# Patient Record
Sex: Female | Born: 1945
Health system: Southern US, Community
[De-identification: ages and names within clinical notes are randomized; demographics above are authoritative.]

## PROBLEM LIST (undated history)

## (undated) DIAGNOSIS — L409 Psoriasis, unspecified: Secondary | ICD-10-CM

## (undated) DIAGNOSIS — T4145XA Adverse effect of unspecified anesthetic, initial encounter: Secondary | ICD-10-CM

## (undated) DIAGNOSIS — T884XXA Failed or difficult intubation, initial encounter: Secondary | ICD-10-CM

## (undated) DIAGNOSIS — N183 Chronic kidney disease, stage 3 (moderate): Secondary | ICD-10-CM

## (undated) DIAGNOSIS — E785 Hyperlipidemia, unspecified: Secondary | ICD-10-CM

## (undated) DIAGNOSIS — I10 Essential (primary) hypertension: Secondary | ICD-10-CM

## (undated) DIAGNOSIS — E119 Type 2 diabetes mellitus without complications: Secondary | ICD-10-CM

## (undated) DIAGNOSIS — M109 Gout, unspecified: Secondary | ICD-10-CM

## (undated) DIAGNOSIS — K635 Polyp of colon: Secondary | ICD-10-CM

## (undated) DIAGNOSIS — T7840XA Allergy, unspecified, initial encounter: Secondary | ICD-10-CM

## (undated) DIAGNOSIS — D649 Anemia, unspecified: Secondary | ICD-10-CM

## (undated) DIAGNOSIS — M1990 Unspecified osteoarthritis, unspecified site: Secondary | ICD-10-CM

## (undated) DIAGNOSIS — K5790 Diverticulosis of intestine, part unspecified, without perforation or abscess without bleeding: Secondary | ICD-10-CM

## (undated) DIAGNOSIS — T8859XA Other complications of anesthesia, initial encounter: Secondary | ICD-10-CM

## (undated) DIAGNOSIS — H269 Unspecified cataract: Secondary | ICD-10-CM

## (undated) HISTORY — DX: Unspecified cataract: H26.9

## (undated) HISTORY — DX: Allergy, unspecified, initial encounter: T78.40XA

## (undated) HISTORY — DX: Anemia, unspecified: D64.9

## (undated) HISTORY — PX: SALPINGOOPHORECTOMY: SHX82

## (undated) HISTORY — DX: Polyp of colon: K63.5

## (undated) HISTORY — PX: HIP SURGERY: SHX245

## (undated) HISTORY — PX: HERNIA REPAIR: SHX51

## (undated) HISTORY — PX: RHINOPLASTY: SHX2354

## (undated) HISTORY — DX: Unspecified osteoarthritis, unspecified site: M19.90

## (undated) HISTORY — PX: CHOLECYSTECTOMY: SHX55

## (undated) HISTORY — DX: Diverticulosis of intestine, part unspecified, without perforation or abscess without bleeding: K57.90

---

## 1999-11-24 ENCOUNTER — Inpatient Hospital Stay (HOSPITAL_COMMUNITY): Admission: EM | Admit: 1999-11-24 | Discharge: 1999-11-29 | Payer: Self-pay | Admitting: *Deleted

## 1999-11-26 ENCOUNTER — Encounter: Payer: Self-pay | Admitting: Family Medicine

## 1999-12-24 ENCOUNTER — Encounter: Admission: RE | Admit: 1999-12-24 | Discharge: 1999-12-24 | Payer: Self-pay | Admitting: Family Medicine

## 2000-12-16 ENCOUNTER — Ambulatory Visit: Admission: RE | Admit: 2000-12-16 | Discharge: 2000-12-16 | Payer: Self-pay | Admitting: Gynecology

## 2002-03-24 HISTORY — PX: TOTAL HIP ARTHROPLASTY: SHX124

## 2002-07-14 ENCOUNTER — Encounter: Payer: Self-pay | Admitting: Orthopedic Surgery

## 2002-07-25 ENCOUNTER — Encounter: Payer: Self-pay | Admitting: Orthopedic Surgery

## 2002-07-25 ENCOUNTER — Inpatient Hospital Stay (HOSPITAL_COMMUNITY): Admission: RE | Admit: 2002-07-25 | Discharge: 2002-07-29 | Payer: Self-pay | Admitting: Orthopedic Surgery

## 2002-10-25 ENCOUNTER — Encounter: Payer: Self-pay | Admitting: General Surgery

## 2002-10-28 ENCOUNTER — Inpatient Hospital Stay (HOSPITAL_COMMUNITY): Admission: RE | Admit: 2002-10-28 | Discharge: 2002-10-30 | Payer: Self-pay | Admitting: General Surgery

## 2002-10-28 ENCOUNTER — Encounter (INDEPENDENT_AMBULATORY_CARE_PROVIDER_SITE_OTHER): Payer: Self-pay | Admitting: Specialist

## 2002-10-28 ENCOUNTER — Encounter: Payer: Self-pay | Admitting: General Surgery

## 2006-10-20 ENCOUNTER — Encounter (INDEPENDENT_AMBULATORY_CARE_PROVIDER_SITE_OTHER): Payer: Self-pay | Admitting: Family Medicine

## 2006-10-20 ENCOUNTER — Ambulatory Visit: Payer: Self-pay | Admitting: Vascular Surgery

## 2006-10-20 ENCOUNTER — Ambulatory Visit (HOSPITAL_COMMUNITY): Admission: RE | Admit: 2006-10-20 | Discharge: 2006-10-20 | Payer: Self-pay | Admitting: Family Medicine

## 2007-03-25 HISTORY — PX: COLONOSCOPY: SHX174

## 2007-05-26 ENCOUNTER — Ambulatory Visit: Payer: Self-pay | Admitting: Vascular Surgery

## 2007-12-17 ENCOUNTER — Ambulatory Visit: Payer: Self-pay | Admitting: Gastroenterology

## 2007-12-31 ENCOUNTER — Ambulatory Visit: Payer: Self-pay | Admitting: Gastroenterology

## 2007-12-31 ENCOUNTER — Encounter: Payer: Self-pay | Admitting: Gastroenterology

## 2008-01-04 ENCOUNTER — Encounter: Payer: Self-pay | Admitting: Gastroenterology

## 2009-11-30 DIAGNOSIS — M5137 Other intervertebral disc degeneration, lumbosacral region: Secondary | ICD-10-CM | POA: Insufficient documentation

## 2009-12-09 DIAGNOSIS — E559 Vitamin D deficiency, unspecified: Secondary | ICD-10-CM | POA: Insufficient documentation

## 2010-06-14 DIAGNOSIS — M19029 Primary osteoarthritis, unspecified elbow: Secondary | ICD-10-CM | POA: Insufficient documentation

## 2010-08-09 NOTE — Op Note (Signed)
NAME:  Jacqueline Richmond, Jacqueline Richmond                       ACCOUNT NO.:  [id number]   MEDICAL RECORD NO.:  [id number]                   PATIENT TYPE:  INP   LOCATION:  0275                                 FACILITY:  [facility]   PHYSICIAN:  Shellia Carwin, M.D.              DATE OF BIRTH:  [birth year]   DATE OF PROCEDURE:  10/28/2002  DATE OF DISCHARGE:                                 OPERATIVE REPORT   OPERATION/PROCEDURE:  1. Repair incisional hernia with Kugel dual-layer mesh.  2. Laparoscopic cholecystectomy with intraoperative cholangiogram.   SURGEON:  Shellia Carwin, M.D.   ASSISTANT:  ________.   ANESTHESIA:  General endotracheal anesthesia.   PREOPERATIVE DIAGNOSES:  1. Incisional hernia.  2. Gallstones.   POSTOPERATIVE DIAGNOSES:  1. Incisional hernia.  2. Gallstones.  3. Normal-appearing intraoperative cholangiogram.   CLINICAL SUMMARY:  A 65 year old lady presented with a enlarging, painful  bulge in her left lower abdomen.  She was approximately two years following  TAH and BSO by Dr. Marti Sleigh.  The bulge actually has been  present about six months and is increasing in size.  Because it is  symptomatic, becoming larger, she comes in for surgery.  Preoperative alk  phos was elevated and ultrasound obtained showing gallstones.  Careful  questioning shows that the patient was having abdominal discomfort  consistent with that.  It was decided to combine the two procedures,  especially since she has a history of difficult intubation and this would  preclude a second operation.   OPERATIVE FINDINGS:  Gallbladder had multiple adhesions and was partially  intrahepatic. It was not acutely inflamed and the cholangiogram looked  normal.  The incisional hernia had incarcerated omentum but was not  inflamed.  The fascial defect was about 8 cm in size and the piece of mesh  used in the repair was approximately 11 x 14 cm.   DESCRIPTION OF PROCEDURE:  Under satisfactory  general endotracheal  anesthesia, having received 1 g Ancef, the patient's abdomen was prepped and  draped in the standard fashion.   A transverse incision made above the umbilicus after Marcaine infiltrated,  in the midline which was opened into the peritoneum and controlled with a  figure-of-eight 0 Vicryl suture.  Camera was placed and we immediately  encountered adhesions from her previous surgery.  I was able to manipulate  the camera so that I could see the upper abdomen and placed a second #10  port medially.  Then using the cautery scissors through that incision, I  took down multiple adhesions so that we could then place our ports in the  right side of the abdomen.  After the was accomplished, using the ports on  the right side, the remaining adhesions were taken down and we had excellent  visualization of the gallbladder.  Gallbladder was grasped and with good  retraction and exposure, I carefully dissected the cystic duct.  When we  were certain of the anatomy by circumferentially dissection, it was clipped  near the gallbladder and incised.  A percutaneously placed catheter was used  to get good cholangiogram, and then the catheter withdrawn and the duct  controlled with multiple clips and divided.  Likewise the main cystic artery  and a second branch were divided between clips and then the gallbladder  removed from below up wards using the cautery for hemostasis and dissection.  It was somewhat tedious because the gallbladder was intrahepatic.  A small  hole was made in the gallbladder and the bile suctioned away, but there were  no spillage of stone.  After the gallbladder was freed from the liver bed,  the bed was lavaged with saline, made dry by cautery and the excess saline  suctioned away.  The gallbladder was placed in an EndoCatch bag.  The camera  moved to the upper port and the grasper placed in the umbilical port, used  to remove the gallbladder intact and without  problems.  The CO2 was  released, the midline was closed with the previous figure-of-eight and a  second interrupted 0 Vicryl suture.  All four sites were approximated with  staples.   The incisional hernia was then repaired. The midline incision was reopened  and the hernia sac dissected from the subcutaneous and soft tissues.  It was  followed down to the fascia.  The hernia sac was then opened and the omentum  that was adherent and partially incarcerated was reduced.  This again was  somewhat tedious and then the adhesions of omentum to the undersurface of  the abdominal wall were freed all around for at least 4 cm to allow good  placement of the mesh.  After that was performed, the 11 x 14 cm composite  Kugel mesh was prepared.  Sutures were placed through and through the fascia  into the abdomen, through and through the mesh, and then back up through the  fascia.  This was initially performed at the four quadrants.  Following  this, interrupted sutures were placed between and then when brought up and  tied on one side, there was a total of five sutures holding the mesh in good  position against the abdominal wall.  Then the other side was treated in a  similar fashion with three additional sutures so that we had eight sutures  totally through the mesh holding it nicely against the abdominal wall.  Following this, the wound was lavaged with saline.  The weakened fascia and  hernia sac was closed over the mesh with interrupted figure-of-eight 0  Prolene suture, the subcutaneous lavaged with saline and drained with a  large JP, the subcutaneous approximated with 0 Vicryl sutures, and skin  edges with staples.  Sterile absorbent dressings were applied.  The drain  was secured with a suture.  There were no complications and the sponge and  needle counts were correct.                                                Shellia Carwin, M.D.   MRL/MEDQ  D:  10/28/2002  T:  10/28/2002   Job:  [id number]   cc:   Marti Sleigh, M.D.

## 2010-08-09 NOTE — Discharge Summary (Signed)
NAME:  Jacqueline Richmond, Jacqueline Richmond                       ACCOUNT NO.:  [id number]   MEDICAL RECORD NO.:  [id number]                   PATIENT TYPE:  INP   LOCATION:  [id number]                                 FACILITY:  [facility]   PHYSICIAN:  Gaynelle Arabian, M.D.                 DATE OF BIRTH:  07-14-1945   DATE OF ADMISSION:  07/25/2002  DATE OF DISCHARGE:  07/29/2002                                 DISCHARGE SUMMARY   ADMITTING DIAGNOSES:  1. Osteoarthritis right hip.  2. Hypertension.  3. Hypercholesterolemia.  4. Abdominal incisional hernia.   DISCHARGE DIAGNOSES:  1. 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  2. Postoperative hyponatremia.  3. Hypertension.  4. Hypercholesterolemia.  5. Abdominal incisional hernia.   PROCEDURE:  The patient was taken to the OR on Jul 25, 2002 and underwent a  right total hip arthroplasty.  Surgeon - Dr. Gaynelle Arabian; assistant - Arlee Muslim, P.A.-C.  Surgery done under general anesthesia.  Estimated blood  loss 400 mL.  Hemovac drain x1.   CONSULTS:  None.   BRIEF HISTORY:  The patient is a 65 year old female seen by Dr. Wynelle Link for  progressive right buttock and right hip pain that has been ongoing for  approximately three years now and getting progressively worse.  She was seen  in the office.  Her x-rays show severe end-stage erosive arthritis of the  right hip, almost an ankylosed appearance.  She is having difficulty walking  and it started having a significant effect on her lifestyle.  It was felt  she would benefit from undergoing hip replacement.  Risks and benefits  discussed and subsequently admitted to the hospital.   LABORATORY DATA:  CBC on admission had a hemoglobin of 14.6, hematocrit  43.0, white cell count 9.1, red cell count 5.14; postoperative H&H 12.2 and  35.5; last noted H&H 11.1 and 32.3 - all within normal limits.  PT/PTT on  admission 13.2 and 34 respectively with an INR of 1.0; serial  protimes were  followed with last noted PT/INR 21.1 and 2.1.  Chem panel on admission:  Minimally elevated ALP of 128; remaining chem panel all within normal  limits.  Serial BMETs were followed, had a drop in calcium from 9.6 to 7.9;  drop in sodium from 143 to 136, last noted at 130.  Urinalysis showed large  leukocyte esterase with few epithelial cells, 3-6 white cells, and only a  few bacteria.  Blood group and type O positive.  Pelvis and hip films taken  on Jul 25, 2002 showed satisfactory position of total right hip replacement.   EKG dated July 14, 2002:  Normal sinus rhythm, nonspecific ST and T wave  abnormalities - confirmed by Dr. Terald Sleeper.  Chest x-ray dated July 14, 2002:  Mild cardiomegaly, no active disease.  Preoperative hip films showed  severe degenerative changes of right  hip joint.   HOSPITAL COURSE:  The patient was admitted to [facility], taken to  the OR, and underwent the above-stated procedure without complication.  The  patient tolerated the procedure well and was later transferred to the  recovery room and then to the orthopedic floor for continued postoperative  care.  Vital signs were followed.  The patient was given 24 hours of  postoperative IV antibiotics and placed on Coumadin for DVT prophylaxis.  PT  and OT were consulted to assist with gait training ambulation and ADLs.  Hemovac drain placed at the time of surgery was pulled on postoperative day  #1 without difficulty.  Dressing changes were initiated on postoperative day  #2.  Hemoglobin remained stable.  The patient did well with physical  therapy, ambulating approximately 50 feet, and 80 feet by postoperative day  #2, and then 100 feet by postoperative day #3, and later 150 feet.  The  patient did very well, progressed well with physical therapy.  Initially  placed on IV analgesics, weaned over to p.o. medications.  IV's were  discontinued.  By postoperative day #3 was passing flatus;   however, no  bowel movement and the patient worked with bowel program, Dulcolax  suppositories.  By postoperative day #4, ambulating quite well, moving  bowels, and was ready to go home.   DISCHARGE MEDICATIONS AND PLAN:  1. The patient discharged home on Jul 29, 2002.  2. Discharge diagnoses:  Please see above.  3. Discharge medications:  Percocet for pain, Robaxin for spasm, Coumadin     for DVT prophylaxis.  4. Diet:  Low sodium diet, low cholesterol diet.  5. Activity:  Touchdown weightbearing right lower extremity.  6. Home health PT and home health nursing per [facility].  7. Total hip protocol.  8. Follow up in two weeks.   CONDITION ON DISCHARGE:  Improved.     Alexzandrew L. Dara Lords, P.A.              Gaynelle Arabian, M.D.    ALP/MEDQ  D:  08/17/2002  T:  08/17/2002  Job:  [id number]

## 2010-08-09 NOTE — Consult Note (Signed)
[facility]  Patient:    Jacqueline Richmond, Jacqueline Richmond Visit Number: [id number] MRN: [id number]          Service Type: GON Location: GYN Attending Physician:  Alvino Chapel Dictated by:   Cherly Anderson. Clarke-Pearson, M.D. Proc. Date: 12/16/00 Admit Date:  12/16/2000 Discharge Date: 12/16/2000   CC:         Caswell Corwin, R.N.                          Consultation Report  FOLLOWUP EVALUATION:  62 white female returns for postoperative checkup.  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  She underwent total abdominal hysterectomy and bilateral salpingo-oophorectomy and had an uncomplicated postoperative course.  Since last visit, she has done well.  She denies any GI or GU symptoms, has no pelvic pain, pressure, vaginal bleeding or discharge.  PHYSICAL EXAMINATION:  VITAL SIGNS:  Weight 216 pounds.  Blood pressure 165/98, pulse 84, respiratory rate 16.  Height 5 feet 5 inches.  GENERAL:  Patient is a healthy white female in no acute distress.  HEENT:  Negative.  NECK:  Supple without thyromegaly.  ABDOMEN:  Soft and nontender.  No masses, organomegaly, ascites or herniae are noted.  PELVIC:  EGBUS normal.  Vagina is clean.  The cuff is healing nicely. Bimanual and rectovaginal exam reveal some postoperative induration without any masses or nodularity.  IMPRESSION:  Good postoperative recovery.  At this juncture, the patient is given the okay to return to full levels of activity.  I have advised her that she should have an annual gynecologic exam which can be performed by her primary care physician and I would be happy to see her in the future if there were other concerns or questions. Dictated by:   Cherly Anderson. Clarke-Pearson, M.D. Attending Physician:  Alvino Chapel DD:  12/22/00 TD:  12/22/00 Job:  [id number]

## 2010-08-09 NOTE — Discharge Summary (Signed)
[place]  Patient:    Jacqueline Richmond, Jacqueline Richmond                        MRN: [id number] Adm. Date:  [id number] Disc. Date: [id number] Attending:  Tiburcio Pea Dictator:   Kelle Darting, M.D. CC:         Dr. Coletta Memos   Discharge Summary  ADMISSION DIAGNOSES: 1. Diarrhea. 2. Dehydration.  DISCHARGE DIAGNOSES: 1. Diarrhea. 2. Dehydration. 3. Acute renal failure.  CONSULTATIONS:  Phone consult was obtained from nephrologist, Dr. Mercy Moore, on November 28, 1999.  PROCEDURES:  None.  PERTINENT LABORATORY DATA:  CBC at admission showed a white blood cell count of 13.8, hemoglobin 17.9, hematocrit 52.7, platelets 415.  Basic metabolic panel at admission showed a sodium of 138, potassium 4.2, chloride 99, CO2 of 16, glucose 163, BUN 65, creatinine 9.3, calcium 8.1, Total protein 8.3, albumin 3.6.  BMET on day of discharge showed a sodium of 139, potassium 3.6, chloride 105, CO2 of 26, BUN 19, creatinine 1.2, glucose 83, calcium 8.9, phosphorus 4.3, albumin 2.2.  Renal ultrasound showed no hydronephrosis, kidneys 13.1 cm bilaterally.  No abnormalities noted.  A 24-hour urine volume 4650 cc, 24-hour protein 1628 mg, 24-hour urine creatinine 2558, 24-hour urine potassium 34, 24-hour urine sodium 270.  HISTORY OF PRESENT ILLNESS AND HOSPITAL COURSE:  The patient is a 65 year old white female who presented with a four-day history of explosive watery diarrhea and vomiting.  She denies fevers and says that she has been unable to take her medications.  She denies any blood in the stool or the vomitus.  She denies any abdominal cramping or any headaches.  She denies taking any recent antibiotics.  She also acknowledges some dizziness upon going from a sitting to standing position.  PAST MEDICAL HISTORY:  Hypertension, hyperlipidemia, and osteoarthritis of the right hip as well as uterine fibroids and enlarged uterus which was being evaluated  by her primary MD.  PAST SURGICAL HISTORY:  Rhinoplasty and tubal ligation 20 years prior to presentation.  MEDICATIONS:  She had been taking only Lipitor, Univasc, atenolol, and occasional Celebrex.  She also notes taking indomethacin occasionally for pain and notes only a maximum of three per day but no significant amount the week prior to presentation.  REVIEW OF SYSTEMS:  She denies any change in her food intake.  SOCIAL HISTORY:  She denies alcohol or drug intake.  PHYSICAL EXAMINATION:  At presentation, her blood pressure was 58/25 and responded to a normal saline bolus to 103/60.  Physical exam was notable for signs of dehydration but was otherwise negative.  ASSESSMENT AND PLAN:  BUN on presentation was 65, creatinine was 9.3, the bicarb was 16.  Albumin was 3.6.  Urinalysis revealed a specific gravity of 1.025, with 100 of glucose, greater than 300 Total protein, nitrite positive, leukocyte esterase trace, and 15 ketones.  The patient was admitted for severe dehydration and was immediately put on normal saline IV.  Her hypertension medications were held and she was admitted to step-down for close observation.  HOSPITAL COURSE BY SYSTEM:  #1 - GASTROINTESTINAL:  The patients diarrhea persisted for the first several days of hospitalization but slowed down considerably on the third hospital day and was concomitant with administration of Pepto-Bismol.  She denied any vomiting after presentation and did not have any during the whole hospital course.  After initial one day of hydration, patient was started on clear-liquid diet and advanced  slowly and by the third hospital day patient was tolerating a regular diet without any problem.  By discharge, her diarrhea had completely resolved.  She remained afebrile throughout the entire hospital stay.  Evaluation of the stool revealed guaiac positive but negative for white blood cells, culture, ova and parasites.  #2 - RENAL/FLUIDS,  ELECTROLYTES, AND NUTRITION:  As stated earlier, the patient was first kept n.p.o. then was advanced on a clear-liquid diet without any problems to a full regular diet.  She was initially hydrated with normal saline and it took several days to respond by an increase in blood pressure and a decrease in her heart rate.  She denied any symptoms of lightheadedness during this time of hypotension.  She actually stated that she did not feel bad at all.  The patient was eventually switched to a maintenance IV fluid after blood pressures began to respond and she began to make urine.  As stated in the HPI, the patients BUN and creatinine at presentation were extremely high at 65 and 9.3.  As the patient was aggressively hydrated, these values slowly diminished and creatinine fell from 9.3 at presentation to 8.1 to 6.7 to 3.9 to 1.7 and finally to 1.2 on the final hospital day.  Serum bicarbonate which was 16 at presentation responded to the aggressive hydration and was 26 at the time of discharge.  It was noted that the patient did not make any urine for the first 24 hours of hospitalization.  She did not have a catheter in during this period of time.  After the first 24 hours of hospitalization, a catheter was inserted and 750 cc of urine was obtained immediately.  After this, the patient consistently drained urine through the catheter.  After several days of this, the catheter was removed and the patient displayed no problems in voiding on her own.  The fact that the patient had such a high BUN and creatinine at presentation, along with the fact that it took several liters of normal saline to begin a diuresis, the question was brought up as to whether she might have had some prior kidney problem.  She denies ever having any kidney problems.  I could talk to Dr. Mercy Moore, the nephrologist, told of this patients story and he gave his opinion.  He stated that it is not entirely uncommon to see such  high BUN and creatinine from severe dehydration  and that given the patients recovery at this point he doubts that there was any renal parenchymal involvement and that it was most likely all the results of prerenal azotemia.  He also commented that the 24-hour urine that was collected was done at a time late in the evolution of this illness which makes it difficult to interpret the data, specifically the proteinuria.  He suggested a follow-up urinalysis in six weeks with her primary care doctor and if proteinuria was seen at that time he suggested getting a 24-hour urine collection and analyzing for proteinuria again and then proceeding possibly with a renal consult at that time.  At the time of discharge, the patient had no problems voiding.  As stated earlier, BUN and creatinine had normalized.  #3 - CARDIOVASCULAR:  At presentation, it was noted that the patients blood pressure was consistently low: The range was from 70 to 80 systolic over 30 to 50 diastolic for approximately the first 1-1/2 days of hospitalization. These finally began to respond to the aggressive rehydration and by the fourth hospital day the patient  who had previously been hypertensive and taking medicine for hypertension began to have normal blood pressures.  In fact, by the end of the hospital stay, she began to have some elevated pressures and her oral beta blocker was restarted at half the original dose.  The ACE inhibitor that she had been on at presentation was discontinued for this hospitalization and was not re-prescribed at discharge.  It should also be noted that the patients heart rate at presentation was consistently 90 to 105 and as did the blood pressure it took a while to respond to the aggressive rehydration but at discharge was consistently in the 80s.  #4 - PULMONARY:  The patient had no periods shortness of breath or chest tightness.  She did not have any previous pulmonary problems.  She did have  an oxygen requirement for several days during this hospitalization.  It is believed that this is most likely due to hypoventilation due to her bedridden status and weakness.  She was weaned off O2 on the second to last hospital day and went home on no oxygen.  #5 - MUSCULOSKELETAL:  The patient had presented with a history of osteoarthritis.  After several days of complete bed rest, the patient attempted to ambulate and had severe difficulty and was actually unable to do so for the first several attempts.  She noted that it was a combination of difficulty with pain in her hip and with swollen feet.  She was noted to have a significant amount of edema in both lower extremities up to the knees.  I believe this is most likely due to her hypoalbuminemia which was secondary to her proteinuria.  After the initial difficulties in walking, the patient was encouraged to ambulate and a PT consult was obtained.  She was assisted in moving around without a walker and with a walker.  It was determined that she would need a walker for balance and support in moving around her surroundings at least for the first week or two after returning home.  She was discharged with a walker.  DISCHARGE INSTRUCTIONS:  No restriction on activity.  Patient was instructed to have a low-sodium diet, less than 2 g a day.  She was instructed to take her atenolol 50 mg q.d., Lipitor 20 mg daily, and Os-Cal + D 1500 mg twice daily.  She was also instructed to call her primary care physician, Dr. Coletta Memos, at [facility] in [place] and arrange a follow-up appointment for the week of September 17 to September 23.  Dr. Coletta Memos had been informed of this hospitalization and her course and he requested follow up at the previously stated dates. DD:  11/29/99 TD:  12/01/99 Job: [id number]

## 2010-08-09 NOTE — Op Note (Signed)
NAME:  Jacqueline Richmond, Jacqueline Richmond                       ACCOUNT NO.:  [id number]   MEDICAL RECORD NO.:  [id number]                   PATIENT TYPE:  INP   LOCATION:  [id number]                                 FACILITY:  [facility]   PHYSICIAN:  Gaynelle Arabian, M.D.                 DATE OF BIRTH:  Aug 24, 1945   DATE OF PROCEDURE:  07/25/2002  DATE OF DISCHARGE:                                 OPERATIVE REPORT   PREOPERATIVE DIAGNOSIS:  Osteoarthritis, right hip.   POSTOPERATIVE DIAGNOSIS:  Osteoarthritis, right hip.   PROCEDURE:  Right total hip arthroplasty.   SURGEON:  Dione Plover. Aluisio, M.D.   ASSISTANT:  Alexzandrew L. Dara Lords, P.A.   ANESTHESIA:  General.   ESTIMATED BLOOD LOSS:  400.   DRAIN:  Hemovac x 1.   COMPLICATIONS:  None.   CONDITION:  Stable to recovery.   BRIEF CLINICAL NOTE:  Jacqueline Richmond is a 65 year old female with severe end-stage  osteoarthritis of the right hip with pain refractory to nonoperative  management.  She has massive osteophyte formation and essentially an effused  hip.  She presents now for total hip arthroplasty.   PROCEDURE IN DETAIL:  After the successful administration of general  anesthetic, the patient is placed in the left lateral decubitus position  with the right side up and held with the hip positioner.  The right lower  extremity is isolated from her perineum with plastic drapes and prepped and  draped in the usual sterile fashion.  Standard posterolateral incision is  made with a 10 blade through subcutaneous tissue to the level of fascia lata  which is incised in line with the skin incision.  Sciatic nerve is palpated  and protected and short rotators isolated off the femur.  Then did a  capsulectomy.  She had such massive osteophytes that the femoral head is  essentially fused, would not rotate.  I removed some of the posterior  osteophytes to allow for some internal rotation in an attempt to dislocate.  We were able to sublux her and put retractors  around the femoral neck.  I  then placed a trial prosthesis up to her native femoral head such that the  center of the trial head corresponds to the center of her native femoral  head, and we marked osteotomy line on the femoral neck and made an osteotomy  with an oscillating saw.  We then retracted the femur anteriorly.  Had to  remove more osteophytes from the acetabulum to pry out the femoral head.  Once the femoral head was out, we gained acetabular exposure and removed the  remainder of the osteophytes circumferentially.   Acetabular reaming is then initiated starting at 45, coursing in increments  of 2 to a 51.  We then placed a 52 mm Pinnacle acetabular shell in anatomic  position with excellent fit, and I transfixed it with the two additional  dome screws with excellent purchase.  We then placed a 36 plus 0 trial liner  in so that we could do a metal-on-metal construct.   Femur is then prepared, first with the canal finder, then irrigation.  Axial  remaining is performed to 13.5 mm, proximal reaming 18D, and a sleeve  machined to a small.  An 18D small trial sleeve is placed and 18 x 13 stem  36 plus 8 neck.  Hip is reduced with excellent stability, full extension,  full external rotation, 70 degrees flexion, 40 degrees adduction, 90 degrees  internal rotation; 90 degrees flexion, 70 degrees internal rotation.  She is  about three-quarters of an inch short preoperatively and with this current  construct on the table, her leg lengths were equalized.  Hip is then  dislocated, and the trials are removed.  Permanent apex hole eliminator is  placed, and the permanent 36 mm neutral Ultra-met metal liner is placed into  the acetabular shell.  In the femur, the 18D small sleeve is placed and 18 x  13 stem with 36 plus 8 neck placed, going about 10 degrees beyond her native  anteversion.  Then placed the 36 plus 0 head, reduced the hip, and there is  excellent stability throughout.  The  wound is copiously irrigated with  antibiotic solution and short rotators reattached to the femur through drill  holes.  Fascia lata is closed over a Hemovac drain with interrupted #1  Vicryl, subcu closed with #1 and then 2-0 Vicryl, and subcuticular running 4-  0 Monocryl.  Incision is then cleaned and dried and Steri-Strips and a bulky  sterile dressing applied.  She is then placed in supine position, knee  immobilizer placed, and she is awakened and transported to recovery in  stable condition.                                               Gaynelle Arabian, M.D.    FA/MEDQ  D:  07/25/2002  T:  07/25/2002  Job:  [id number]

## 2010-08-09 NOTE — H&P (Signed)
NAME:  Jacqueline Richmond, Jacqueline Richmond                       ACCOUNT NO.:  [id number]   MEDICAL RECORD NO.:  [id number]                   PATIENT TYPE:  INP   LOCATION:  [id number]                                 [facility]ACILITY:  [facility]   PHYSICIAN:  Gaynelle Arabian, M.D.                 DATE OF BIRTH:  December 27, 1945   DATE OF ADMISSION:  07/25/2002  DATE OF DISCHARGE:                                HISTORY & PHYSICAL   DATE OF OFFICE VISIT HISTORY AND PHYSICAL:  July 19, 2002.   CHIEF COMPLAINT:  Right hip pain.   HISTORY OF PRESENT ILLNESS:  The patient is a 65 year old female who has  been seen by Dr. Wynelle Link for progressive right buttock and right hip pain.  It has been ongoing for approximately three years now and is progressively  getting worse.  She is having difficulty walking long distances and has  started having significant effect on her lifestyle.  She has difficulty  getting up and out of a sitting position.  The pain is mainly laterally in  the buttock.  She was seen in the office where x-rays do show severe end-  stage erosive arthritis of the right hip with almost an ankylosed  appearance.  It is felt she would best be served by undergoing a hip  replacement.  The risks and benefits were discussed, and the patient has  elected to proceed with surgery.   ALLERGIES:  TALWIN and CODEINE.   CURRENT MEDICATIONS:  1. Toprol-XL 100 mg daily.  2. Lipitor 10 mg daily.  3. Indocin for gout p.r.n.   PAST MEDICAL HISTORY:  1. Hypertension.  2. Incisional hernia.  3. Elevated cholesterol.   PAST SURGICAL HISTORY:  1. Rhinoplasty in 1970.  2. Tubal ligation in 1987.  3. Salpingo-oophorectomy with hysterectomy in 2002.   SOCIAL HISTORY:  Married.  A realtor, an Art therapist at [employer].  Nonsmoker.  Only social intake of alcohol.  Has one son and four stepdaughters.  A two-  story home, two steps entering.   FAMILY HISTORY:  Father deceased at age 47 with a heart attack and heart  disease.   REVIEW  OF SYSTEMS:  GENERAL:  No fevers, chills, or night sweats.  NEUROLOGIC:  No seizures, syncope, or paralysis.  RESPIRATORY:  No shortness  of breath, productive cough, or hemoptysis.  CARDIOVASCULAR:  No chest pain,  angina, or orthopnea.  GASTROINTESTINAL:  No nausea, vomiting, diarrhea, or  constipation.  No blood or mucous in the stool.  GENITOURINARY:  No dysuria,  hematuria, or discharge.  MUSCULOSKELETAL:  Pertinent to that of the right  hip found in the history of present illness.   PHYSICAL EXAMINATION:  VITAL SIGNS:  Pulse 80, respirations 14, blood  pressure 148/98.  GENERAL:  The patient is a 66 year old white female, well-nourished, well-  developed.  Appears to be in no acute distress.  She is alert, oriented, and  cooperative.  HEENT:  Normocephalic, atraumatic.  Pupils round and reactive.  EOMs are  intact.  Oropharynx clear.  NECK:  Supple.  CHEST:  Clear to auscultation anterior and posterior chest walls.  No  rhonchi, rales, or wheezing.  HEART:  Regular rate and rhythm.  No murmurs.  ABDOMEN:  Soft and nontender.  Slightly round.  She does have an incisional  hernia.  Bowel sounds present.  RECTAL, BREASTS, GENITALIA:  Not done.  Not pertinent to present illness.  EXTREMITIES:  Significant to that of the right lower extremity.  She has hip  flexion of only about 70 degrees.  No internal or external rotation.  Only  abduction of about 10 degrees.  She does ambulate with an antalgic gait.   IMPRESSION:  1. Osteoarthritis right hip.  2. Hypertension.  3. Hypercholesterolemia.  4. Abdominal incisional hernia.   PLAN:  The patient will be admitted to [facility] to undergo a  right total hip replacement arthroplasty.  Surgery will be performed by Dr.  Gaynelle Arabian.     Alexzandrew L. Dara Lords, P.A.              Gaynelle Arabian, M.D.    ALP/MEDQ  D:  07/25/2002  T:  07/26/2002  Job:  [id number]

## 2010-12-23 LAB — GLUCOSE, CAPILLARY
Glucose-Capillary: 125 — ABNORMAL HIGH
Glucose-Capillary: 130 — ABNORMAL HIGH

## 2011-08-29 ENCOUNTER — Inpatient Hospital Stay (HOSPITAL_COMMUNITY)
Admission: EM | Admit: 2011-08-29 | Discharge: 2011-09-02 | DRG: 568 | Disposition: A | Discharge status: Home / Self Care | Payer: BC Managed Care – PPO | Attending: Internal Medicine | Admitting: Internal Medicine

## 2011-08-29 ENCOUNTER — Inpatient Hospital Stay (HOSPITAL_COMMUNITY): Payer: BC Managed Care – PPO

## 2011-08-29 ENCOUNTER — Encounter (HOSPITAL_COMMUNITY): Payer: Self-pay | Admitting: *Deleted

## 2011-08-29 DIAGNOSIS — N281 Cyst of kidney, acquired: Secondary | ICD-10-CM | POA: Diagnosis present

## 2011-08-29 DIAGNOSIS — I10 Essential (primary) hypertension: Secondary | ICD-10-CM

## 2011-08-29 DIAGNOSIS — R112 Nausea with vomiting, unspecified: Secondary | ICD-10-CM | POA: Diagnosis present

## 2011-08-29 DIAGNOSIS — E119 Type 2 diabetes mellitus without complications: Secondary | ICD-10-CM

## 2011-08-29 DIAGNOSIS — E875 Hyperkalemia: Secondary | ICD-10-CM | POA: Diagnosis present

## 2011-08-29 DIAGNOSIS — E872 Acidosis, unspecified: Secondary | ICD-10-CM | POA: Diagnosis present

## 2011-08-29 DIAGNOSIS — J309 Allergic rhinitis, unspecified: Secondary | ICD-10-CM | POA: Diagnosis present

## 2011-08-29 DIAGNOSIS — M109 Gout, unspecified: Secondary | ICD-10-CM | POA: Diagnosis present

## 2011-08-29 DIAGNOSIS — M6282 Rhabdomyolysis: Secondary | ICD-10-CM | POA: Diagnosis present

## 2011-08-29 DIAGNOSIS — N179 Acute kidney failure, unspecified: Secondary | ICD-10-CM

## 2011-08-29 DIAGNOSIS — N1831 Chronic kidney disease, stage 3a: Secondary | ICD-10-CM | POA: Diagnosis present

## 2011-08-29 DIAGNOSIS — I129 Hypertensive chronic kidney disease with stage 1 through stage 4 chronic kidney disease, or unspecified chronic kidney disease: Secondary | ICD-10-CM | POA: Diagnosis present

## 2011-08-29 DIAGNOSIS — K529 Noninfective gastroenteritis and colitis, unspecified: Secondary | ICD-10-CM

## 2011-08-29 DIAGNOSIS — E860 Dehydration: Secondary | ICD-10-CM

## 2011-08-29 DIAGNOSIS — N183 Chronic kidney disease, stage 3 unspecified: Secondary | ICD-10-CM

## 2011-08-29 HISTORY — DX: Chronic kidney disease, stage 3 unspecified: N18.30

## 2011-08-29 HISTORY — DX: Essential (primary) hypertension: I10

## 2011-08-29 HISTORY — DX: Type 2 diabetes mellitus without complications: E11.9

## 2011-08-29 HISTORY — DX: Adverse effect of unspecified anesthetic, initial encounter: T41.45XA

## 2011-08-29 HISTORY — DX: Chronic kidney disease, stage 3 (moderate): N18.3

## 2011-08-29 HISTORY — DX: Other complications of anesthesia, initial encounter: T88.59XA

## 2011-08-29 LAB — GLUCOSE, CAPILLARY
Glucose-Capillary: 156 mg/dL — ABNORMAL HIGH (ref 70–99)
Glucose-Capillary: 129 mg/dL — ABNORMAL HIGH (ref 70–99)
Glucose-Capillary: 111 mg/dL — ABNORMAL HIGH (ref 70–99)

## 2011-08-29 LAB — DIFFERENTIAL
Basophils Absolute: 0 10*3/uL (ref 0.0–0.1)
Lymphocytes Relative: 7 % — ABNORMAL LOW (ref 12–46)
Monocytes Relative: 2 % — ABNORMAL LOW (ref 3–12)
Neutrophils Relative %: 91 % — ABNORMAL HIGH (ref 43–77)

## 2011-08-29 LAB — CBC
Platelets: 252 10*3/uL (ref 150–400)
RDW: 15 % (ref 11.5–15.5)
WBC: 13.9 10*3/uL — ABNORMAL HIGH (ref 4.0–10.5)

## 2011-08-29 LAB — URINALYSIS, ROUTINE W REFLEX MICROSCOPIC
Glucose, UA: 100 mg/dL — AB
Specific Gravity, Urine: 1.016 (ref 1.005–1.030)
pH: 5.5 (ref 5.0–8.0)

## 2011-08-29 LAB — URINE MICROSCOPIC-ADD ON

## 2011-08-29 LAB — BASIC METABOLIC PANEL
Chloride: 102 mEq/L (ref 96–112)
Chloride: 108 mEq/L (ref 96–112)
Creatinine, Ser: 5.87 mg/dL — ABNORMAL HIGH (ref 0.50–1.10)
Creatinine, Ser: 5.34 mg/dL — ABNORMAL HIGH (ref 0.50–1.10)
GFR calc Af Amer: 8 mL/min — ABNORMAL LOW (ref >90)
GFR calc Af Amer: 9 mL/min — ABNORMAL LOW (ref >90)
GFR calc non Af Amer: 7 mL/min — ABNORMAL LOW (ref >90)
GFR calc non Af Amer: 8 mL/min — ABNORMAL LOW (ref >90)
Potassium: >7.5 mEq/L (ref 3.5–5.1)
Potassium: >7.5 mEq/L (ref 3.5–5.1)

## 2011-08-29 LAB — CK: Total CK: 1547 U/L — ABNORMAL HIGH (ref 7–177)

## 2011-08-29 LAB — HEPATIC FUNCTION PANEL
AST: 70 U/L — ABNORMAL HIGH (ref 0–37)
Bilirubin, Direct: <0.1 mg/dL (ref 0.0–0.3)

## 2011-08-29 IMAGING — US US RENAL
1 series · 14 of 23 positions shown · non-contrast
Comparison: No comparison studies available.

CLINICAL DATA: Acute renal failure

RENAL / URINARY TRACT ULTRASOUND
TECHNIQUE: Complete ultrasound exam of the kidneys and urinary
bladder was performed.

[Series 1: us renal · 0.32mm/px · 14 of 23 slices shown]
[im 1/23]
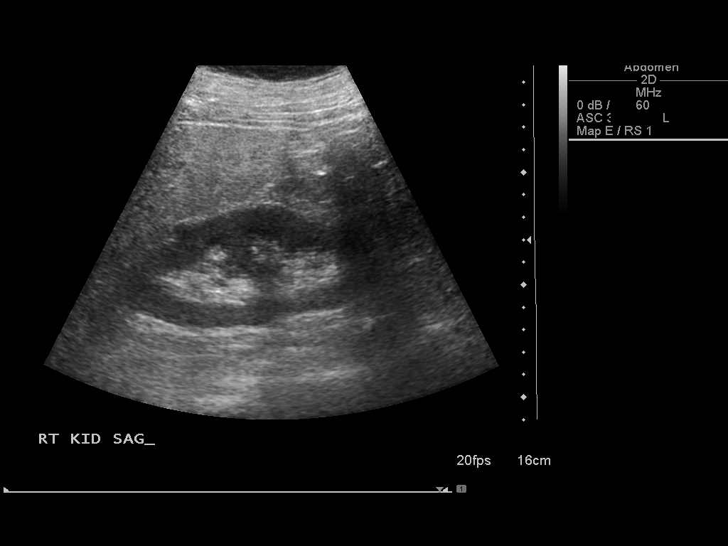
[im 3/23]
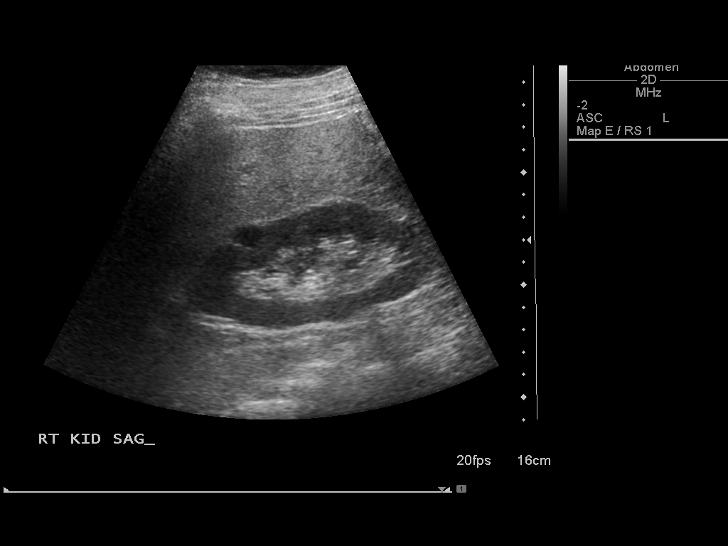
[im 5/23]
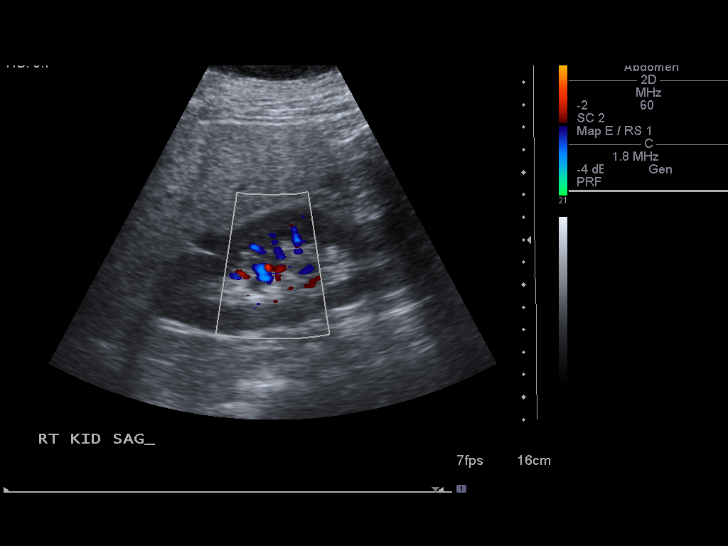
[im 6/23]
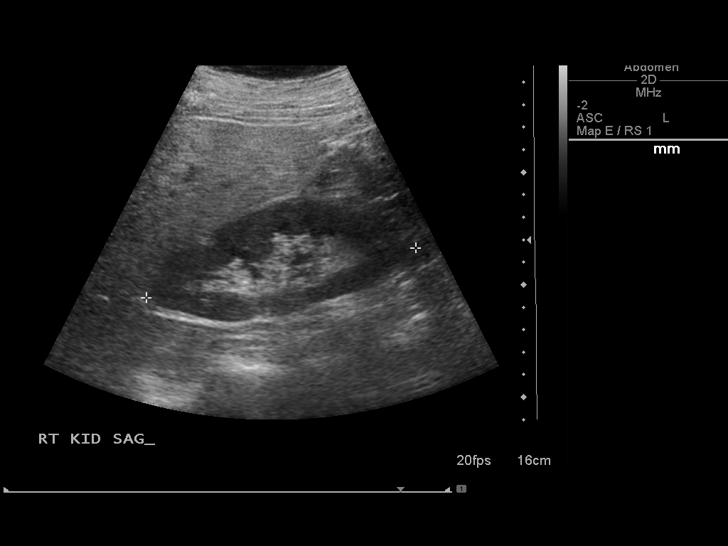
[im 8/23]
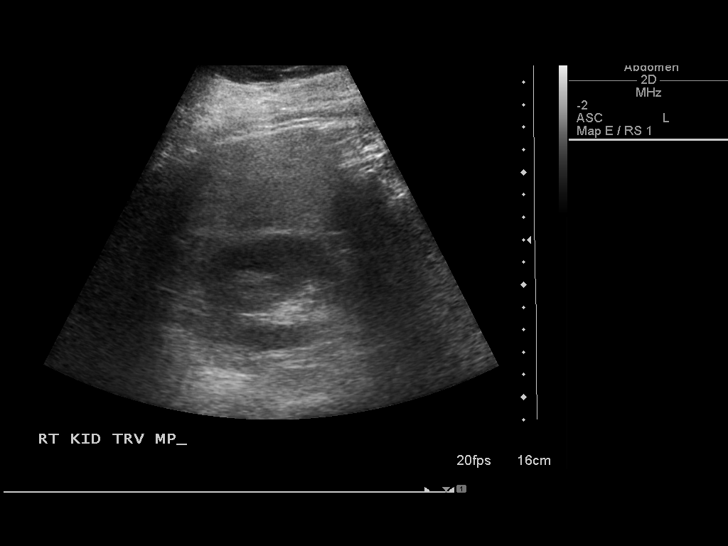
[im 10/23]
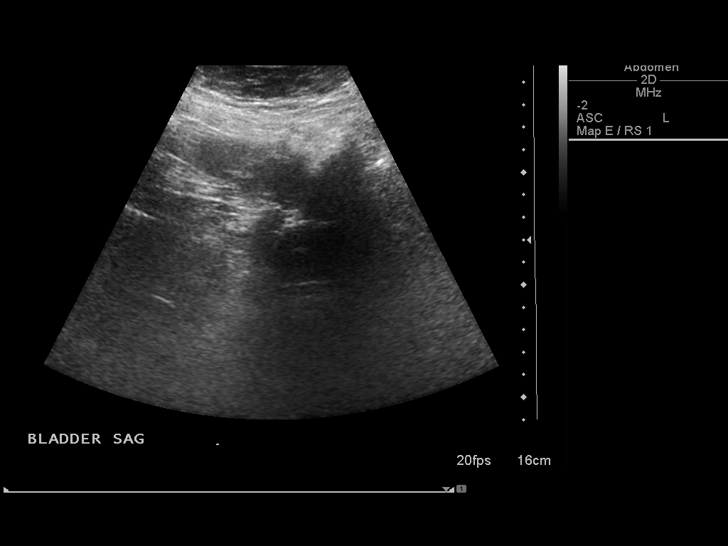
[im 11/23]
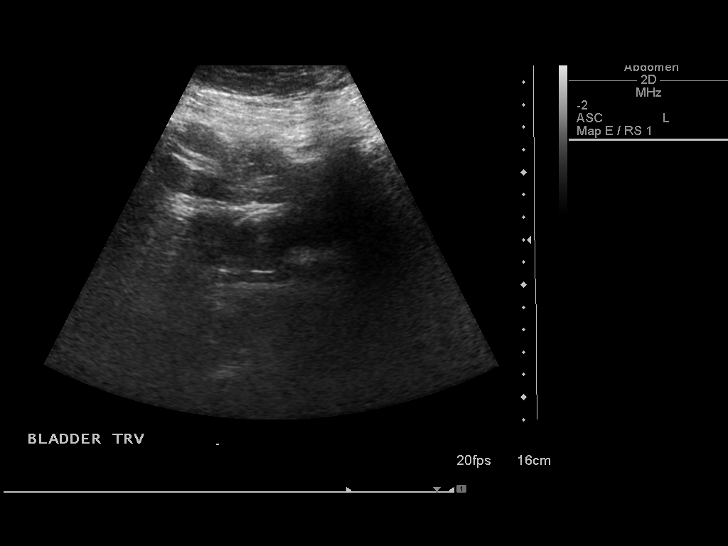
[im 13/23]
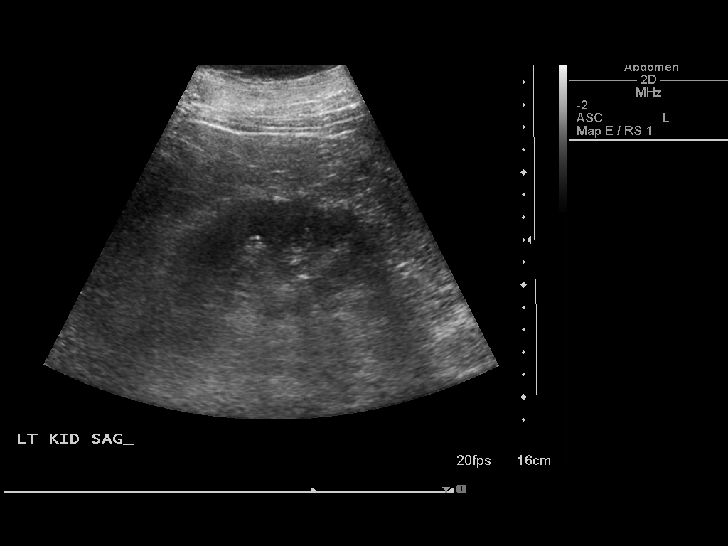
[im 14/23]
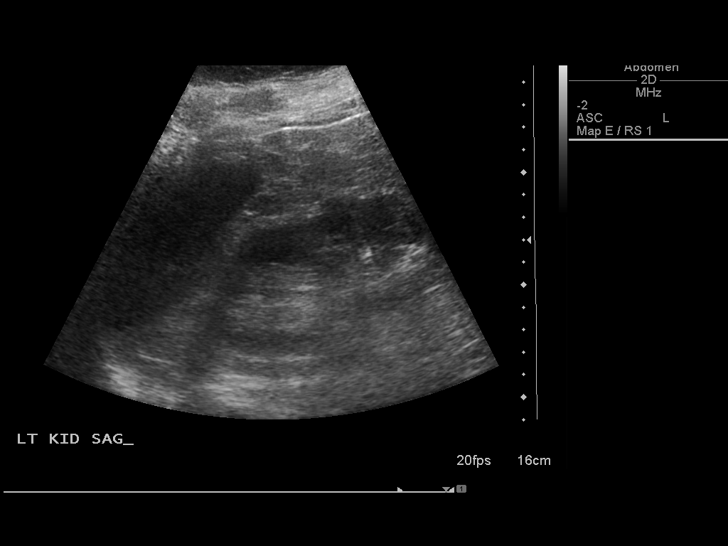
[im 16/23]
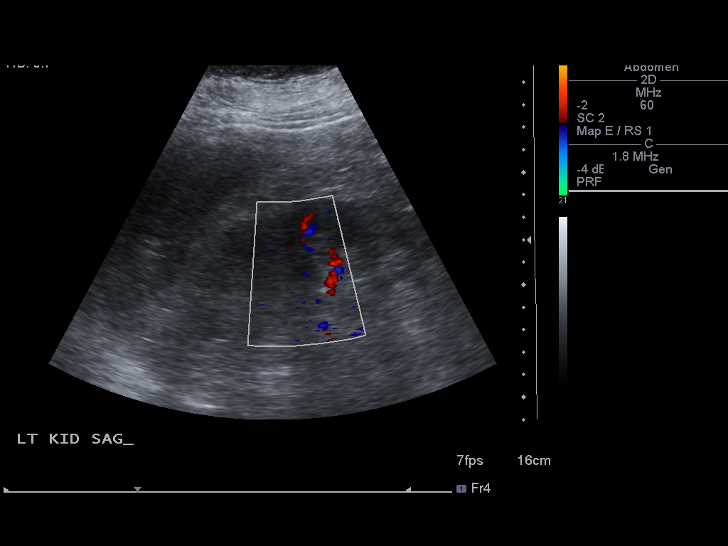
[im 18/23]
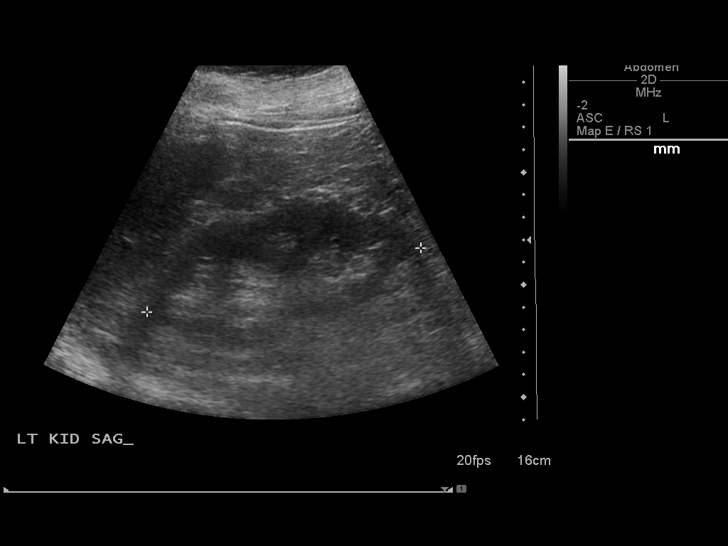
[im 19/23]
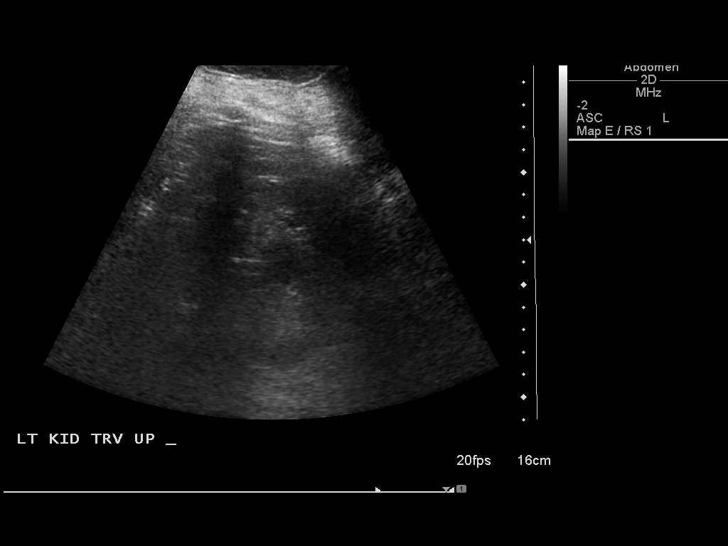
[im 21/23]
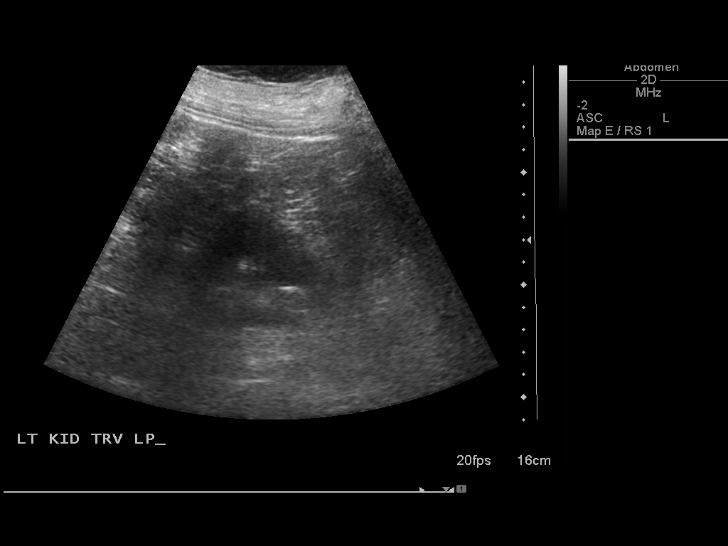
[im 23/23]
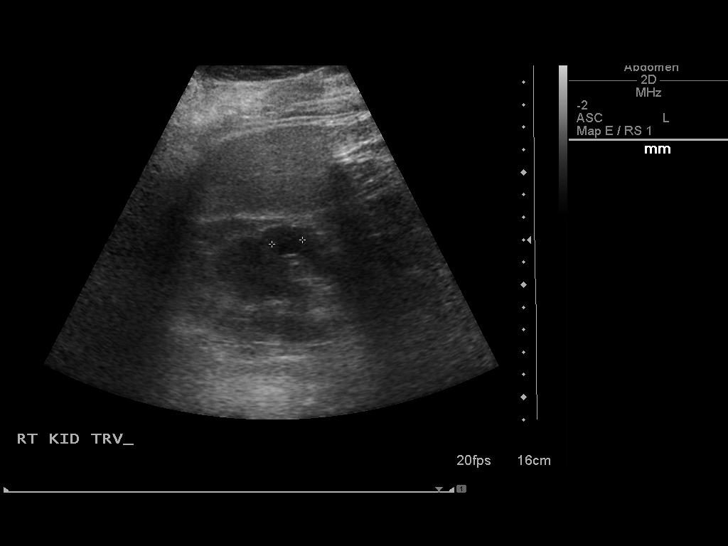

[14 of 23 positions shown; findings below may reference images not displayed]

FINDINGS: The right kidney measures 12.2 cm in long axis.  The left kidney
measures 12.5 cm.  There is no evidence for hydronephrosis in
either kidney.  15 mm probable cyst noted in the upper pole of the
right kidney.

Midline imaging through the pelvis reveals a decompressed urinary
bladder secondary to Foley catheter placement.
IMPRESSION: No evidence for hydronephrosis.

## 2011-08-29 MED ORDER — GUAIFENESIN-DM 100-10 MG/5ML PO SYRP: 5.0000 mL | ORAL_SOLUTION | ORAL | Status: DC | PRN

## 2011-08-29 MED ORDER — SODIUM POLYSTYRENE SULFONATE 15 GM/60ML PO SUSP
30.0000 g | Freq: Once | ORAL | Status: AC
  Administered 2011-08-29: 30 g via RECTAL
  Filled 2011-08-29: qty 120

## 2011-08-29 MED ORDER — SODIUM CHLORIDE 0.9 % IJ SOLN
3.0000 mL | Freq: Two times a day (BID) | INTRAMUSCULAR | Status: DC
  Administered 2011-08-30 – 2011-09-01 (×3): 3 mL via INTRAVENOUS

## 2011-08-29 MED ORDER — INSULIN ASPART 100 UNIT/ML ~~LOC~~ SOLN
SUBCUTANEOUS | Status: AC
  Filled 2011-08-29: qty 1

## 2011-08-29 MED ORDER — SODIUM POLYSTYRENE SULFONATE 15 GM/60ML PO SUSP
60.0000 g | Freq: Once | ORAL | Status: AC
  Administered 2011-08-29: 60 g via ORAL
  Filled 2011-08-29: qty 120
  Filled 2011-08-29: qty 240

## 2011-08-29 MED ORDER — DEXTROSE 50 % IV SOLN
25.0000 g | Freq: Once | INTRAVENOUS | Status: AC
  Administered 2011-08-29: 25 g via INTRAVENOUS
  Filled 2011-08-29 (×2): qty 50

## 2011-08-29 MED ORDER — ALBUTEROL SULFATE (5 MG/ML) 0.5% IN NEBU: 2.5000 mg | INHALATION_SOLUTION | RESPIRATORY_TRACT | Status: DC | PRN

## 2011-08-29 MED ORDER — INSULIN ASPART 100 UNIT/ML IV SOLN
10.0000 [IU] | Freq: Once | INTRAVENOUS | Status: AC
  Administered 2011-08-29: 10 [IU] via INTRAVENOUS
  Filled 2011-08-29: qty 0.1

## 2011-08-29 MED ORDER — SODIUM BICARBONATE 8.4 % IV SOLN
50.0000 meq | Freq: Once | INTRAVENOUS | Status: AC
  Administered 2011-08-29: 50 meq via INTRAVENOUS
  Filled 2011-08-29: qty 50

## 2011-08-29 MED ORDER — ONDANSETRON HCL 4 MG/2ML IJ SOLN
4.0000 mg | Freq: Four times a day (QID) | INTRAMUSCULAR | Status: DC | PRN
  Administered 2011-08-30 – 2011-08-31 (×3): 4 mg via INTRAVENOUS
  Filled 2011-08-29 (×4): qty 2

## 2011-08-29 MED ORDER — SODIUM CHLORIDE 0.9 % IV SOLN
1.0000 g | Freq: Once | INTRAVENOUS | Status: AC
  Administered 2011-08-29: 1 g via INTRAVENOUS
  Filled 2011-08-29: qty 10

## 2011-08-29 MED ORDER — VITAMIN B-12 100 MCG PO TABS
100.0000 ug | ORAL_TABLET | Freq: Every day | ORAL | Status: DC
  Administered 2011-08-29 – 2011-09-02 (×5): 100 ug via ORAL
  Filled 2011-08-29 (×7): qty 1

## 2011-08-29 MED ORDER — CLONIDINE HCL 0.2 MG PO TABS
0.2000 mg | ORAL_TABLET | Freq: Two times a day (BID) | ORAL | Status: DC
  Administered 2011-08-29: 0.2 mg via ORAL
  Filled 2011-08-29 (×3): qty 1

## 2011-08-29 MED ORDER — INSULIN REGULAR HUMAN 100 UNIT/ML IJ SOLN: 5.0000 [IU] | Freq: Once | INTRAMUSCULAR | Status: DC

## 2011-08-29 MED ORDER — HEPARIN SODIUM (PORCINE) 5000 UNIT/ML IJ SOLN
5000.0000 [IU] | Freq: Three times a day (TID) | INTRAMUSCULAR | Status: DC
Start: 2011-08-29 — End: 2011-09-02
  Administered 2011-08-29 – 2011-09-02 (×11): 5000 [IU] via SUBCUTANEOUS
  Filled 2011-08-29 (×14): qty 1

## 2011-08-29 MED ORDER — CALCIUM GLUCONATE 10 % IV SOLN
1.0000 g | Freq: Once | INTRAVENOUS | Status: AC
  Administered 2011-08-29: 1 g via INTRAVENOUS
  Filled 2011-08-29: qty 10

## 2011-08-29 MED ORDER — DEXTROSE 50 % IV SOLN
25.0000 mL | Freq: Once | INTRAVENOUS | Status: AC
  Administered 2011-08-29: 25 mL via INTRAVENOUS
  Filled 2011-08-29: qty 50

## 2011-08-29 MED ORDER — ALBUTEROL SULFATE (5 MG/ML) 0.5% IN NEBU
5.0000 mg | INHALATION_SOLUTION | Freq: Once | RESPIRATORY_TRACT | Status: AC
  Administered 2011-08-29: 5 mg via RESPIRATORY_TRACT
  Filled 2011-08-29: qty 1

## 2011-08-29 MED ORDER — INSULIN ASPART 100 UNIT/ML IV SOLN
5.0000 [IU] | Freq: Once | INTRAVENOUS | Status: AC
  Administered 2011-08-29: 5 [IU] via INTRAVENOUS

## 2011-08-29 MED ORDER — SODIUM CHLORIDE 0.9 % IV BOLUS (SEPSIS)
500.0000 mL | Freq: Once | INTRAVENOUS | Status: AC
  Administered 2011-08-29: 1000 mL via INTRAVENOUS

## 2011-08-29 MED ORDER — HYDROCODONE-ACETAMINOPHEN 5-325 MG PO TABS
1.0000 | ORAL_TABLET | ORAL | Status: DC | PRN
  Administered 2011-08-30: 1 via ORAL
  Filled 2011-08-29: qty 1

## 2011-08-29 MED ORDER — ONDANSETRON HCL 4 MG PO TABS
4.0000 mg | ORAL_TABLET | Freq: Four times a day (QID) | ORAL | Status: DC | PRN
  Administered 2011-08-30: 4 mg via ORAL
  Filled 2011-08-29: qty 1

## 2011-08-29 MED ORDER — STERILE WATER FOR INJECTION IV SOLN
INTRAVENOUS | Status: DC
  Administered 2011-08-29: 19:00:00 via INTRAVENOUS
  Filled 2011-08-29 (×5): qty 850

## 2011-08-29 MED ORDER — INSULIN ASPART 100 UNIT/ML ~~LOC~~ SOLN
0.0000 [IU] | Freq: Three times a day (TID) | SUBCUTANEOUS | Status: DC
  Administered 2011-08-30: 2 [IU] via SUBCUTANEOUS
  Administered 2011-08-30: 1 [IU] via SUBCUTANEOUS
  Administered 2011-08-31: 2 [IU] via SUBCUTANEOUS
  Administered 2011-08-31 – 2011-09-01 (×3): 1 [IU] via SUBCUTANEOUS

## 2011-08-29 MED ORDER — SODIUM CHLORIDE 0.9 % IV BOLUS (SEPSIS)
500.0000 mL | Freq: Once | INTRAVENOUS | Status: DC
  Administered 2011-08-29: 500 mL via INTRAVENOUS

## 2011-08-29 MED ORDER — INSULIN REGULAR HUMAN 100 UNIT/ML IJ SOLN
10.0000 [IU] | Freq: Once | INTRAMUSCULAR | Status: DC
  Filled 2011-08-29: qty 0.1

## 2011-08-29 MED ORDER — ONDANSETRON HCL 4 MG/2ML IJ SOLN
4.0000 mg | Freq: Once | INTRAMUSCULAR | Status: AC
  Administered 2011-08-29: 4 mg via INTRAVENOUS
  Filled 2011-08-29: qty 2

## 2011-08-29 MED ORDER — ATENOLOL 50 MG PO TABS
50.0000 mg | ORAL_TABLET | Freq: Two times a day (BID) | ORAL | Status: DC
  Administered 2011-08-29: 50 mg via ORAL
  Filled 2011-08-29 (×3): qty 1

## 2011-08-29 MED ORDER — SODIUM CHLORIDE 0.9 % IV BOLUS (SEPSIS)
500.0000 mL | Freq: Once | INTRAVENOUS | Status: AC
  Administered 2011-08-29: 500 mL via INTRAVENOUS

## 2011-08-29 NOTE — ED Notes (Signed)
edmd notified of critical lab vaule

## 2011-08-29 NOTE — Consult Note (Signed)
Jacqueline Richmond 08/29/2011 Morgan D Requesting Physician:  Dr. Jules Husbands, [facility] ED  Reason for Consult:  Renal failure and highK+ HPI: The patient is a 66 y.o. year-old with hx of DM 89yrs, HTN 70yrs, CKD with baseline creat 1.7-1.8, hx gout presented with gen weakness and N/V/D for 2wks. In ED patient found to have high K+ >7.5, wide QRS on EKG, normal BP and creat of 5.87.  Patient with refractory diarrhea, N/V recently, has not seen doctor until today.  Went to PCP first, who directed her to ED.    No hx renal failure in family, hearing loss or visual deficit. No difficulty voiding, change in urine color or quantity No SOB, CP.  +right THR, cholecystecomty, bilat SPO No tob, etoh, lives with husband in [place] No NSAIDS or OTC analgesics  Creatinine, Ser  Date/Time Value Range Status  08/29/2011  1:04 PM 5.87* 0.50-1.10 (mg/dL) Final    Past Medical History:  Past Medical History  Diagnosis Date  . Hypertension     Past Surgical History:  Past Surgical History  Procedure Date  . Cholecystectomy     Family History: No family history on file. Social History:  does not have a smoking history on file. She does not have any smokeless tobacco history on file. Her alcohol and drug histories not on file.  Allergies:  Allergies  Allergen Reactions  . Codeine     REACTION: itching  . Pentazocine Lactate     REACTION: hallucinations    Home medications: Prior to Admission medications   Medication Sig Start Date End Date Taking? Authorizing Provider  aspirin EC 81 MG tablet Take 81 mg by mouth daily.   Yes Historical Provider, MD  atenolol (TENORMIN) 100 MG tablet Take 100 mg by mouth daily.   Yes Historical Provider, MD  CINNAMON PO Take 1 capsule by mouth daily.   Yes Historical Provider, MD  cloNIDine (CATAPRES) 0.3 MG tablet Take 0.3 mg by mouth 2 (two) times daily.   Yes Historical Provider, MD  febuxostat (ULORIC) 40 MG tablet Take 40 mg by mouth daily.   Yes Historical  Provider, MD  fish oil-omega-3 fatty acids 1000 MG capsule Take 1 g by mouth daily.   Yes Historical Provider, MD  Garlic Oil (GARLIC [id number]) [id number] MG CAPS Take 2 capsules by mouth daily.   Yes Historical Provider, MD  glipiZIDE (GLUCOTROL XL) 5 MG 24 hr tablet Take 5 mg by mouth daily.   Yes Historical Provider, MD  glipiZIDE (GLUCOTROL) 5 MG tablet Take 5 mg by mouth 2 (two) times daily before a meal.   Yes Historical Provider, MD  indomethacin (INDOCIN) 25 MG capsule Take 25 mg by mouth 2 (two) times daily with a meal.   Yes Historical Provider, MD  lisinopril-hydrochlorothiazide (PRINZIDE,ZESTORETIC) 20-12.5 MG per tablet Take 1 tablet by mouth daily.   Yes Historical Provider, MD  metFORMIN (GLUCOPHAGE) 1000 MG tablet Take 1,000 mg by mouth 2 (two) times daily with a meal.   Yes Historical Provider, MD  niacin 500 MG tablet Take 500 mg by mouth 2 (two) times daily with a meal.   Yes Historical Provider, MD  rosuvastatin (CRESTOR) 10 MG tablet Take 10 mg by mouth daily.   Yes Historical Provider, MD  vitamin B-12 (CYANOCOBALAMIN) 100 MCG tablet Take 100 mcg by mouth daily.   Yes Historical Provider, MD    Inpatient medications:    . albuterol  5 mg Nebulization Once  . calcium gluconate  1 g  Intravenous Once  . dextrose  25 g Intravenous Once  . insulin aspart  10 Units Intravenous Once  . ondansetron  4 mg Intravenous Once  . sodium bicarbonate  50 mEq Intravenous Once  . sodium chloride  500 mL Intravenous Once  . sodium polystyrene  60 g Oral Once  . DISCONTD: insulin regular  10 Units Intravenous Once    Review of Systems Gen:  Denies headache, fever, chills, sweats.  No weight loss. HEENT:  No visual change, sore throat, difficulty swallowing. Resp:  No difficulty breathing, DOE.  No cough or hemoptysis. Cardiac:  No chest pain, orthopnea, PND.  Denies edema. GI:   See above GU:  Denies difficulty or change in voiding.  No change in urine color.     MS:  Denies joint pain or  swelling.   Derm:  Denies skin rash or itching.  No chronic skin conditions.  Neuro:   Denies focal weakness, memory problems, hx stroke or TIA.   Psych:  Denies symptoms of depression of anxiety.  No hallucination.    Labs: Basic Metabolic Panel:  Lab [id number] 1304  NA 133*  K >7.5*  CL 102  CO2 7*  GLUCOSE 159*  BUN 146*  CREATININE 5.87*  ALB --  CALCIUM 9.7  PHOS --   CBC:  Lab 08/29/11 1304  WBC 13.9*  NEUTROABS 12.6*  HGB 14.5  HCT 43.8  MCV 79.1  PLT 252     Physical Exam:  Blood pressure 165/61, pulse 80, temperature 97.7 F (36.5 C), temperature source Oral, resp. rate 20, SpO2 100.00%.  Gen: looks tired HEENT: PERRL, EOMI, dry mouth Skin: no rash, cyanosis Neck: no JVD, flat neck veins, no bruits or LAN Chest: clear bilat, no rales or wheezing Heart: regular, no rub or gallop Abdomen: soft, nontender, nondistended Ext: no edema, good pulses in the feet, no rash or ulceration Neuro: alert, Ox3, no focal deficit Heme/Lymph: no bruising or LAN  EKG#1 12:28p   NSR 70 bpm, QRS 170 msec, peaked T's EKG#2  2:13p    NSR  76 bpm, QRS 144 msec, peaked T's down slightly  Impression/Plan 1. Hyperkalemia, severe with EKG changes, due to AKI and ACEI/NSAID effect - agree with Rx, kayexalate, NaHC03 drip at 150/hr, NS bolus (done) and IV acute Rx with insulin/glu/Ca++/bicarb (done). Should improve.  Hold NSAID'S, ACEI 2. Acute kidney injury - on CKD, due to vol depletion/acei/nsaids 3. CKD - baseline creat 1.7-1.8.  prob due to dm/htn, will check UA, urine prot:cr ratio and UNa 4. DM2 - x 70yrs 5. HTN - x 6 yrs 6. Hx gout  Thanks for the referral, will follow.   Kelly Splinter  MD [facility] [phone] pgr    [phone] cell 08/29/2011, 3:30 PM

## 2011-08-29 NOTE — H&P (Addendum)
[facility]                                                                                     Patient Demographics  Jacqueline Richmond, is a 66 y.o. female  CSN: [id number]  MRN: [id number]  DOB - 1945/11/01  Admit Date - 08/29/2011  Outpatient Primary MD for the patient is No primary provider on file.   With History of -  Past Medical History  Diagnosis Date  . Hypertension    Diabetes mellitus type 2 CTD stage III Gout Hyper tension  Past Surgical History  Procedure Date  . Cholecystectomy     in for   Chief Complaint  Patient presents with  . Nausea  . Emesis  . Diarrhea     HPI  Jacqueline Richmond  is a 66 y.o. female, with history of diabetes mellitus type 2, hypertension, chronic kidney disease stage III last creatinine 1.7 in April of this year confirmed by her primary care physician personally over the phone (Dr Coletta Memos) , who has been experiencing nausea vomiting and diarrhea for the last 2 weeks, denies any abdominal pain denies any fever denies any blood or mucus in stool or emesis, denies any exposure to sick contacts or antibiotic use.   She says she has been not eating or drinking well for 2 weeks also, has been gradually getting more and more weak to the point that today she had to be rolled in a wheelchair to the PCP office where she was found to be hypotensive and PCP sent her to the ER.   In the ER patient was found to have potassium of 7.5, creatinine of 5.7, case was discussed with nephrology and I was requested to admit the patient. Patient did have mild peaked T waves on EKG, she was relatively symptom-free except for generalized weakness.    Review of Systems    In addition to the HPI above,   No Fever-chills, No Headache, No changes with Vision or hearing, No problems swallowing food or Liquids, No Chest pain, Cough or Shortness of Breath, No Abdominal pain, positive nausea vomiting and diarrhea, no blood in emesis, no blood or mucus in  stool No Blood in stool or Urine, No dysuria, No new skin rashes or bruises, No new joints pains-aches,  No new weakness, tingling, numbness in any extremity, extreme generalized weakness No recent weight gain or loss, No polyuria, polydypsia or polyphagia, No significant Mental Stressors.  A full 10 point Review of Systems was done, except as stated above, all other Review of Systems were negative.   Social History History  Substance Use Topics  . Smoking status: Not on file  . Smokeless tobacco: Not on file  . Alcohol Use:       Family History No kidney problems in the family  Prior to Admission medications   Medication Sig Start Date End Date Taking? Authorizing Provider  aspirin EC 81 MG tablet Take 81 mg by mouth daily.   Yes Historical Provider, MD  atenolol (TENORMIN) 100 MG tablet Take 100 mg by mouth daily.   Yes Historical Provider, MD  CINNAMON PO Take 1 capsule by  mouth daily.   Yes Historical Provider, MD  cloNIDine (CATAPRES) 0.3 MG tablet Take 0.3 mg by mouth 2 (two) times daily.   Yes Historical Provider, MD  febuxostat (ULORIC) 40 MG tablet Take 40 mg by mouth daily.   Yes Historical Provider, MD  fish oil-omega-3 fatty acids 1000 MG capsule Take 1 g by mouth daily.   Yes Historical Provider, MD  Garlic Oil (GARLIC 99991111) 99991111 MG CAPS Take 2 capsules by mouth daily.   Yes Historical Provider, MD  glipiZIDE (GLUCOTROL XL) 5 MG 24 hr tablet Take 5 mg by mouth daily.   Yes Historical Provider, MD  glipiZIDE (GLUCOTROL) 5 MG tablet Take 5 mg by mouth 2 (two) times daily before a meal.   Yes Historical Provider, MD  indomethacin (INDOCIN) 25 MG capsule Take 25 mg by mouth 2 (two) times daily with a meal.   Yes Historical Provider, MD  lisinopril-hydrochlorothiazide (PRINZIDE,ZESTORETIC) 20-12.5 MG per tablet Take 1 tablet by mouth daily.   Yes Historical Provider, MD  metFORMIN (GLUCOPHAGE) 1000 MG tablet Take 1,000 mg by mouth 2 (two) times daily with a meal.   Yes  Historical Provider, MD  niacin 500 MG tablet Take 500 mg by mouth 2 (two) times daily with a meal.   Yes Historical Provider, MD  rosuvastatin (CRESTOR) 10 MG tablet Take 10 mg by mouth daily.   Yes Historical Provider, MD  vitamin B-12 (CYANOCOBALAMIN) 100 MCG tablet Take 100 mcg by mouth daily.   Yes Historical Provider, MD    Allergies  Allergen Reactions  . Codeine     REACTION: itching  . Pentazocine Lactate     REACTION: hallucinations    Physical Exam  Vitals  Blood pressure 165/61, pulse 80, temperature 97.7 F (36.5 C), temperature source Oral, resp. rate 20, SpO2 100.00%.   1. General obese middle-aged Caucasian female lying in bed in NAD,     2. Normal affect and insight, Not Suicidal or Homicidal, Awake Alert, Oriented *3.  3. No F.N deficits, ALL C.Nerves Intact, Strength 5/5 all 4 extremities, Sensation intact all 4 extremities, Plantars down going.  4. Ears and Eyes appear Normal, Conjunctivae clear, PERRLA. Dry Oral Mucosa.  5. Supple Neck, No JVD, No cervical lymphadenopathy appriciated, No Carotid Bruits.  6. Symmetrical Chest wall movement, Good air movement bilaterally, CTAB.  7. RRR, No Gallops, Rubs or Murmurs, No Parasternal Heave.  8. Positive Bowel Sounds, Abdomen Soft, Non tender, No organomegaly appriciated,       No rebound -guarding or rigidity.  9.  No Cyanosis, Normal Skin Turgor, No Skin Rash or Bruise.  10. Good muscle tone,  joints appear normal , no effusions, Normal ROM.  11. No Palpable Lymph Nodes in Neck or Axillae     Data Review  CBC  Lab 08/29/11 1304  WBC 13.9*  HGB 14.5  HCT 43.8  PLT 252  MCV 79.1  MCH 26.2  MCHC 33.1  RDW 15.0  LYMPHSABS 1.0  MONOABS 0.3  EOSABS 0.0  BASOSABS 0.0  BANDABS --   ------------------------------------------------------------------------------------------------------------------  Chemistries   Lab 08/29/11 1304  NA 133*  K >7.5*  CL 102  CO2 7*  GLUCOSE 159*  BUN 146*   CREATININE 5.87*  CALCIUM 9.7  MG --  AST --  ALT --  ALKPHOS --  BILITOT --   ------------------------------------------------------------------------------------------------------------------ CrCl is unknown because there is no height on file for the current visit. ------------------------------------------------------------------------------------------------------------------ No results found for this basename: TSH,T4TOTAL,FREET3,T3FREE,THYROIDAB in the last  72 hours   Coagulation profile No results found for this basename: INR:5,PROTIME:5 in the last 168 hours ------------------------------------------------------------------------------------------------------------------- No results found for this basename: DDIMER:2 in the last 72 hours -------------------------------------------------------------------------------------------------------------------  Cardiac Enzymes No results found for this basename: CK:3,CKMB:3,TROPONINI:3,MYOGLOBIN:3 in the last 168 hours ------------------------------------------------------------------------------------------------------------------ No components found with this basename: POCBNP:3   ---------------------------------------------------------------------------------------------------------------  Urinalysis No results found for this basename: colorurine, appearanceur, labspec, phurine, glucoseu, hgbur, bilirubinur, ketonesur, proteinur, urobilinogen, nitrite, leukocytesur    Imaging results:   No results found.  My personal review of EKG: Rhythm NSR, Rate 78 /min, mildly peaked T waves especially in the lateral leads.QRS 145ms(improving from 124ms)     Assessment & Plan    1. Acute renal failure and hyperkalemia in a patient with chronic kidney disease stage III baseline creatinine 1.7. Likely caused by nausea vomiting diarrhea and dehydration - patient has been given calcium gluconate, D50, IV insulin, Bicarb, Kayexalate in  the ER, she will be aggressively rehydrated, her nephrotoxic medications i.e. NSAIDs, ACE inhibitor, diuretics, uro-reck will all be held. She will be monitored in stepped-down on telemetry, potassium will be monitored closely, have already discussed her case with nephrologist Dr. Jonnie Finner who will see the patient shortly. There is a small chance that patient might need dialysis this was discussed with patient it was acceptable to her. UA and renal ultrasound has been ordered. We'll be placed on a renal diet. Will also check her CK as she is on statin and low-grade with generalized weakness 2 rule out rhabdo, also UA will be checked to look for evidence of myoglobinuria.   Addendum repeat 5:15 potassium again reported to be over 7.5, repeated EKG with normal QRS and T. wave, patient making good urine, creatinine improved, discussed again with nephrology, recommend IV D50, IV insulin, calcium gluconate, Kayexalate enema, 1 amp of IV bicarbonate, repeating potassium and an hour. Nephrology still thinks patient will not need dialysis. Will repeat potassium again in about an hours time. Potassium will be closely monitored. If remains persistently high will transfer to [facility] for further nephrology workup and possible dialysis. Have explained this to patient and husband again bedside.     2. Metabolic acidosis with anion gap - likely due to renal insufficiency and uremia-leukocytosis normal, we'll treat her as above monitor BMP serially renal to see the patient shortly.Bicarb drip for now.   3. Hypertension- no acute issues we'll keep her on half her home dose of Catapres and blocker with strict holding parameters.   4. Diabetes mellitus type 2- oral medications will be held q. a.c. at bedtime sliding scale.   5. History of gout no acute issues for now Uloric & NSAIDs will be held.   6. Nausea vomiting diarrhea- no blood or mucus in emesis or stool, no antibiotic a sick exposure, currently  symptom-free, has tolerated liquid diet this morning, will monitor if symptoms persist GI consultation, will order stool cultures and lactoferrin.      DVT Prophylaxis Heparin     AM Labs Ordered, also please review Full Orders   Admission, patients condition and plan of care including tests being ordered have been discussed with the patient   who indicates understanding and agree with the plan and Code Status.  Code Status full  Condition GUARDED   Thurnell Lose M.D on 08/29/2011 at 3:34 PM  Between 7am to 7pm - Pager - [phone]  After 7pm go to [email or web address] - password [email or web address]  And look for the night coverage person covering  me after hours  [facility] Office  [phone]

## 2011-08-29 NOTE — ED Notes (Signed)
Per ems:  Pt was at the pcp office ems called due to n/v/d x 2 weeks. Last emesis last night.

## 2011-08-29 NOTE — ED Provider Notes (Signed)
History     CSN: [id number]  Arrival date & time 08/29/11  1213   First MD Initiated Contact with Patient 08/29/11 1242      Chief Complaint  Patient presents with  . Nausea  . Emesis  . Diarrhea    (Consider location/radiation/quality/duration/timing/severity/associated sxs/prior treatment) The history is provided by the patient.   the patient is a 66 year old, female, with a history of hypertension, and borderline diabetes, who presents to emergency department complaining of nausea, vomiting, and diarrhea for the past 2 weeks.  She denies pain anywhere.  She denies fevers, chills, rash, or recent antibiotic use.  She denies exposure to anyone with similar symptoms and she denies recent travel.  She states that she is lightheaded, when she stands, up.  She denies blood in her stool or emesis.  Past Medical History  Diagnosis Date  . Hypertension     Past Surgical History  Procedure Date  . Cholecystectomy     No family history on file.  History  Substance Use Topics  . Smoking status: Not on file  . Smokeless tobacco: Not on file  . Alcohol Use:     OB History    Grav Para Term Preterm Abortions TAB SAB Ect Mult Living                  Review of Systems  Constitutional: Negative for fever and chills.  Eyes: Negative for pain.  Respiratory: Negative for chest tightness and shortness of breath.   Cardiovascular: Positive for leg swelling. Negative for chest pain.  Gastrointestinal: Positive for vomiting and diarrhea. Negative for abdominal pain and blood in stool.  Musculoskeletal: Negative for back pain.  Skin: Negative for rash.  Neurological: Positive for light-headedness. Negative for headaches.  Hematological: Does not bruise/bleed easily.  Psychiatric/Behavioral: Negative for confusion.  All other systems reviewed and are negative.    Allergies  Codeine and Pentazocine lactate  Home Medications  No current outpatient prescriptions on file.  BP  165/61  Pulse 80  Temp(Src) 97.7 F (36.5 C) (Oral)  Resp 20  SpO2 100%  Physical Exam  Nursing note and vitals reviewed. Constitutional: She is oriented to person, place, and time. She appears well-developed and well-nourished. No distress.       Listless  HENT:  Head: Normocephalic and atraumatic.  Eyes: Conjunctivae and EOM are normal.  Neck: Normal range of motion.  Cardiovascular: Normal rate.   No murmur heard. Pulmonary/Chest: Effort normal. No respiratory distress. She has no wheezes. She has no rales.  Abdominal: She exhibits no distension. There is no tenderness.  Musculoskeletal: Normal range of motion. She exhibits no edema and no tenderness.  Neurological: She is alert and oriented to person, place, and time. No cranial nerve deficit.  Skin: Skin is warm and dry.  Psychiatric: She has a normal mood and affect. Thought content normal.    ED Course  Procedures (including critical care time) 65 year old, female, with symptoms consistent with gastroenteritis for the past 2 weeks.  There is no history of antibiotic use, fever, or rash, or recent travel.  I doubt this is infectious in nature.  We will establish an IV treat her with antibiotics and perform laboratory testing.   Labs Reviewed  CBC  DIFFERENTIAL  BASIC METABOLIC PANEL   No results found.   No diagnosis found. ECG  normal sinus rhythm with heart rate 78 beats per minute.   Normal axis First degree AV block with nonspecific intraventricular conduction delay. Left  ventricular hypertrophy. Normal.  ST and T waves  CRITICAL CARE Performed by: Onesty Clair P   Total critical care time: 30 min  Critical care time was exclusive of separately billable procedures and treating other patients.  Critical care was necessary to treat or prevent imminent or life-threatening deterioration.  Critical care was time spent personally by me on the following activities: development of treatment plan with  patient and/or surrogate as well as nursing, discussions with consultants, evaluation of patient's response to treatment, examination of patient, obtaining history from patient or surrogate, ordering and performing treatments and interventions, ordering and review of laboratory studies, ordering and review of radiographic studies, pulse oximetry and re-evaluation of patient's condition.  Spoke with Nephrologists, Public relations account executive and [place].  One of them will consult on pt for ARF and possible need for dialysis  SPoke with [facility]. They will admit.  MDM  gastroenteritis Acute renal failure hyperkalemia       Barbara Cower, MD 08/29/11 1511

## 2011-08-29 NOTE — ED Notes (Signed)
Pharmacy notified to to send ca+ gluconate

## 2011-08-29 NOTE — ED Notes (Signed)
Lab called talked to chrissy. Repeat labs have been sent.

## 2011-08-29 NOTE — Progress Notes (Signed)
CRITICAL VALUE ALERT  Critical value received:  K >7.5, CO2- 9   Date of notification:  08/29/11  Time of notification:  [id number]  Critical value read back:yes  Nurse who received alert:  Laverle Hobby, RN   MD notified (1st page):  Candiss Norse   Time of first page:  1711  MD notified (2nd page):  Time of second page:  Responding MD:  Candiss Norse  Time MD responded:  [phone]

## 2011-08-29 NOTE — ED Notes (Signed)
Pt will start sodium bicarb on the floor. Pt was completing saline bolus

## 2011-08-29 NOTE — ED Notes (Signed)
Pt states she has had n/v/d for 2 weeks. Pt denies any abdominal pain. Pt state she has had decrease in po intake.

## 2011-08-29 NOTE — ED Notes (Signed)
[id number] Expected date:<BR> Expected time:12:01 PM<BR> Means of arrival:<BR> Comments:<BR> M12 - 66yoF NVDx2 wks, weak

## 2011-08-30 LAB — BASIC METABOLIC PANEL
BUN: 118 mg/dL — ABNORMAL HIGH (ref 6–23)
GFR calc non Af Amer: 11 mL/min — ABNORMAL LOW (ref >90)
Glucose, Bld: 157 mg/dL — ABNORMAL HIGH (ref 70–99)
Potassium: 3.9 mEq/L (ref 3.5–5.1)

## 2011-08-30 LAB — POTASSIUM: Potassium: 4 mEq/L (ref 3.5–5.1)

## 2011-08-30 LAB — CBC
HCT: 38.5 % (ref 36.0–46.0)
Hemoglobin: 12.7 g/dL (ref 12.0–15.0)
MCH: 25.5 pg — ABNORMAL LOW (ref 26.0–34.0)
MCHC: 33 g/dL (ref 30.0–36.0)

## 2011-08-30 LAB — GLUCOSE, CAPILLARY: Glucose-Capillary: 123 mg/dL — ABNORMAL HIGH (ref 70–99)

## 2011-08-30 LAB — TSH: TSH: 0.377 u[IU]/mL (ref 0.350–4.500)

## 2011-08-30 LAB — PROTIME-INR
INR: 1.15 (ref 0.00–1.49)
Prothrombin Time: 14.9 seconds (ref 11.6–15.2)

## 2011-08-30 MED ORDER — ATENOLOL 100 MG PO TABS
100.0000 mg | ORAL_TABLET | Freq: Two times a day (BID) | ORAL | Status: DC
  Administered 2011-08-30 – 2011-09-02 (×7): 100 mg via ORAL
  Filled 2011-08-30 (×8): qty 1

## 2011-08-30 MED ORDER — CLONIDINE HCL 0.2 MG PO TABS
0.2000 mg | ORAL_TABLET | Freq: Three times a day (TID) | ORAL | Status: DC
  Administered 2011-08-30 – 2011-09-02 (×10): 0.2 mg via ORAL
  Filled 2011-08-30 (×12): qty 1

## 2011-08-30 MED ORDER — SODIUM BICARBONATE 8.4 % IV SOLN
INTRAVENOUS | Status: AC
  Administered 2011-08-30 (×2): via INTRAVENOUS
  Filled 2011-08-30 (×4): qty 850

## 2011-08-30 NOTE — Progress Notes (Signed)
Pt arrived to floor with foley catheter in place.  No insertion documentation noted.  Catheter is patent, secured via stat lock, and drainage bag below the level of the bladder.

## 2011-08-30 NOTE — Progress Notes (Signed)
Subjective: Feeling better, stronger  Objective Vital signs in last 24 hours: Filed Vitals:   08/29/11 2053 08/30/11 0052 08/30/11 0449 08/30/11 1000  BP: 160/92  144/85 158/88  Pulse: 100  93 94  Temp: 98.1 F (36.7 C)  98.1 F (36.7 C)   TempSrc: Oral  Oral   Resp: 20  20   Height:      Weight:  80.7 kg (177 lb 14.6 oz)    SpO2: 100%  97%    Weight change:   Intake/Output Summary (Last 24 hours) at 08/30/11 1310 Last data filed at 08/30/11 1300  Gross per 24 hour  Intake   1015 ml  Output   2977 ml  Net  -1962 ml   Labs: Basic Metabolic Panel:  Lab [id number] 0550 08/30/11 0231 08/30/11 0015 08/29/11 2158 08/29/11 1944 08/29/11 1551 08/29/11 1304  NA 145 -- -- -- -- 137 133*  K 3.9 4.0 4.2 4.7 3.8 >7.5* >7.5*  CL 110 -- -- -- -- 108 102  CO2 16* -- -- -- -- 9* 7*  GLUCOSE 157* -- -- -- -- 216* 159*  BUN 118* -- -- -- -- 143* 146*  CREATININE 3.97* -- -- -- -- 5.34* 5.87*  ALB -- -- -- -- -- -- --  CALCIUM 9.5 -- -- -- -- 9.3 9.7  PHOS -- -- -- -- -- -- --   Liver Function Tests:  Lab 08/29/11 1600  AST 70*  ALT 53*  ALKPHOS 134*  BILITOT 0.3  PROT 7.4  ALBUMIN 3.3*   No results found for this basename: LIPASE:3,AMYLASE:3 in the last 168 hours No results found for this basename: AMMONIA:3 in the last 168 hours CBC:  Lab 08/30/11 0550 08/29/11 1304  WBC 9.7 13.9*  NEUTROABS -- 12.6*  HGB 12.7 14.5  HCT 38.5 43.8  MCV 77.2* 79.1  PLT 225 252   PT/INR: @labrcntip (inr:5) Cardiac Enzymes:  Lab 08/29/11 1551  CKTOTAL 1547*  CKMB --  CKMBINDEX --  TROPONINI --   CBG:  Lab 08/30/11 0757 08/29/11 2138 08/29/11 2007 08/29/11 1732 08/29/11 1549  GLUCAP 161* 111* 129* 114* 156*    Iron Studies: No results found for this basename: IRON:30,TIBC:30,TRANSFERRIN:30,FERRITIN:30 in the last 168 hours  Physical Exam:  Blood pressure 158/88, pulse 94, temperature 98.1 F (36.7 C), temperature source Oral, resp. rate 20, height 5\' 2"  (1.575 m), weight 80.7  kg (177 lb 14.6 oz), SpO2 97.00%.  Gen: looks tired  HEENT: PERRL, EOMI, dry mouth  Skin: no rash, cyanosis  Neck: no JVD, flat neck veins, no bruits or LAN  Chest: clear bilat, no rales or wheezing  Heart: regular, no rub or gallop  Abdomen: soft, nontender, nondistended  Ext: no edema, good pulses in the feet, no rash or ulceration  Neuro: alert, Ox3, no focal deficit  Heme/Lymph: no bruising or LAN    EKG#1 12:28p NSR 70 bpm, QRS 170 msec, peaked T's  EKG#2 2:13p NSR 76 bpm, QRS 144 msec, peaked T's down slightly UA- neg prot, 3-6 rbc/wbc   Impression: 1. Hyperkalemia, severe- mild rhabdo probably not instrumental in AKI, rather severe vol depletion+ACEI most likely the culprits. Resolving nicely with IVF's. See below. 2. Acute on CKD- due to vol depletion/acei/nsaids, resolving with IVF's and holding meds 3. CKD - baseline creat 1.7-1.8. prob due to dm/htn, UA is negative 4. DM2 - x 42yrs 5. HTN - x 6 yrs 6. Hx gout  Rec: Cont IVF's, no further suggestions, will sign off. Please call  as needed.      Kelly Splinter  MD [facility] [phone] pgr    [phone] cell 08/30/2011, 1:10 PM

## 2011-08-30 NOTE — Progress Notes (Signed)
[facility]                                                                                 Patient Demographics  Jacqueline Richmond, is a 66 y.o. female  [id number]  [id number]  DOB - 12/01/1945  Admit date - 08/29/2011  Admitting Physician Thurnell Lose, MD  Outpatient Primary MD for the patient is No primary provider on file.  LOS - 1    Chief Complaint  Patient presents with  . Nausea  . Emesis  . Diarrhea        Subjective:   Jacqueline Richmond today has, No headache, No chest pain, No abdominal pain - No Nausea, No new weakness tingling or numbness, No Cough - SOB.   Objective:   Filed Vitals:   08/29/11 1701 08/29/11 2053 08/30/11 0052 08/30/11 0449  BP: 167/80 160/92  144/85  Pulse: 96 100  93  Temp:  98.1 F (36.7 C)  98.1 F (36.7 C)  TempSrc:  Oral  Oral  Resp:  20  20  Height: 5\' 2"  (1.575 m)     Weight: 80.2 kg (176 lb 12.9 oz)  80.7 kg (177 lb 14.6 oz)   SpO2: 100% 100%  97%    Wt Readings from Last 3 Encounters:  08/30/11 80.7 kg (177 lb 14.6 oz)     Intake/Output Summary (Last 24 hours) at 08/30/11 0837 Last data filed at 08/30/11 0450  Gross per 24 hour  Intake    295 ml  Output   2976 ml  Net  -2681 ml    Exam Awake Alert, Oriented *3, No new F.N deficits, Normal affect [place].AT,PERRAL Supple Neck,No JVD, No cervical lymphadenopathy appriciated.  Symmetrical Chest wall movement, Good air movement bilaterally, CTAB RRR,No Gallops,Rubs or new Murmurs, No Parasternal Heave +ve B.Sounds, Abd Soft, Non tender, No organomegaly appriciated, No rebound -guarding or rigidity. No Cyanosis, Clubbing or edema, No new Rash or bruise    Data Review  CBC  Lab 08/30/11 0550 08/29/11 1304  WBC 9.7 13.9*  HGB 12.7 14.5  HCT 38.5 43.8  PLT 225 252  MCV 77.2* 79.1  MCH 25.5* 26.2  MCHC 33.0 33.1  RDW 15.3 15.0  LYMPHSABS -- 1.0  MONOABS -- 0.3  EOSABS -- 0.0  BASOSABS -- 0.0  BANDABS -- --    Chemistries   Lab 08/30/11  0550 08/30/11 0231 08/30/11 0015 08/29/11 2158 08/29/11 1944 08/29/11 1600 08/29/11 1551 08/29/11 1304  NA 145 -- -- -- -- -- 137 133*  K 3.9 4.0 4.2 4.7 3.8 -- -- --  CL 110 -- -- -- -- -- 108 102  CO2 16* -- -- -- -- -- 9* 7*  GLUCOSE 157* -- -- -- -- -- 216* 159*  BUN 118* -- -- -- -- -- 143* 146*  CREATININE 3.97* -- -- -- -- -- 5.34* 5.87*  CALCIUM 9.5 -- -- -- -- -- 9.3 9.7  MG -- -- -- -- -- -- -- --  AST -- -- -- -- -- 70* -- --  ALT -- -- -- -- -- 53* -- --  ALKPHOS -- -- -- -- -- 134* -- --  BILITOT -- -- -- -- --  0.3 -- --   ------------------------------------------------------------------------------------------------------------------ estimated creatinine clearance is 13.9 ml/min (by C-G formula based on Cr of 3.97). ------------------------------------------------------------------------------------------------------------------ No results found for this basename: HGBA1C:2 in the last 72 hours ------------------------------------------------------------------------------------------------------------------ No results found for this basename: CHOL:2,HDL:2,LDLCALC:2,TRIG:2,CHOLHDL:2,LDLDIRECT:2 in the last 72 hours ------------------------------------------------------------------------------------------------------------------  [facility] 08/29/11 1304  TSH 0.377  T4TOTAL --  T3FREE --  THYROIDAB --   ------------------------------------------------------------------------------------------------------------------ No results found for this basename: VITAMINB12:2,FOLATE:2,FERRITIN:2,TIBC:2,IRON:2,RETICCTPCT:2 in the last 72 hours  Coagulation profile  Lab 08/30/11 0550  INR 1.15  PROTIME --    No results found for this basename: DDIMER:2 in the last 72 hours  Cardiac Enzymes No results found for this basename: CK:3,CKMB:3,TROPONINI:3,MYOGLOBIN:3 in the last 168  hours ------------------------------------------------------------------------------------------------------------------ No components found with this basename: POCBNP:3  Micro Results No results found for this or any previous visit (from the past 240 hour(s)).  Radiology Reports US Renal  08/29/2011  *RADIOLOGY REPORT*  Clinical Data: Acute renal failure  RENAL / URINARY TRACT ULTRASOUND  Technique:  Complete ultrasound exam of the kidneys and urinary bladder was performed.  Comparison:  No comparison studies available.  Findings:  The right kidney measures 12.2 cm in long axis.  The left kidney measures 12.5 cm.  There is no evidence for hydronephrosis in either kidney.  15 mm probable cyst noted in the upper pole of the right kidney.  Midline imaging through the pelvis reveals a decompressed urinary bladder secondary to Foley catheter placement.  Impression:  No evidence for hydronephrosis.  Original Report Authenticated By: ERIC A. MANSELL, M.D.    Scheduled Meds:   . albuterol  5 mg Nebulization Once  . atenolol  50 mg Oral BID  . calcium gluconate  1 g Intravenous Once  . calcium gluconate  1 g Intravenous Once  . cloNIDine  0.2 mg Oral BID  . dextrose  25 g Intravenous Once  . dextrose  25 mL Intravenous Once  . heparin  5,000 Units Subcutaneous Q8H  . insulin aspart      . insulin aspart  0-9 Units Subcutaneous TID WC  . insulin aspart  10 Units Intravenous Once  . insulin aspart  5 Units Intravenous Once  . ondansetron  4 mg Intravenous Once  . sodium bicarbonate  50 mEq Intravenous Once  . sodium bicarbonate  50 mEq Intravenous Once  . sodium chloride  500 mL Intravenous Once  . sodium chloride  500 mL Intravenous Once  . sodium chloride  3 mL Intravenous Q12H  . sodium polystyrene  30 g Rectal Once  . sodium polystyrene  60 g Oral Once  . vitamin B-12  100 mcg Oral Daily  . DISCONTD: insulin regular  10 Units Intravenous Once  . DISCONTD: insulin regular  5 Units  Intravenous Once  . DISCONTD: sodium chloride  500 mL Intravenous Once   Continuous Infusions:   .  sodium bicarbonate infusion sterile water 1000 mL 150 mL/hr at 08/29/11 1838   PRN Meds:.albuterol, guaiFENesin-dextromethorphan, HYDROcodone-acetaminophen, ondansetron (ZOFRAN) IV, ondansetron  Assessment & Plan   1. Acute renal failure and hyperkalemia in a patient with chronic kidney disease stage III baseline creatinine 1.7 (hypertension, diabetes, gout, nephrotoxic medications). Likely worsened by nausea vomiting diarrhea and dehydration, her nephrotoxic medications i.e. NSAIDs, ACE inhibitor, diuretics, Urolic will all be held. She has been kept on IV bicarbonate drip with good results, potassium and creatinine have improved, renal ultrasound stable, UA noted, await myoglobin in urine, CK was elevated likely due to being on urolic and statin,  for now we'll continue IV fluids, serially monitor BMP on a daily basis, likely will plateau around 2.5-3, reduce bicarbonate rate to 75 cc an hour for another day, renal to follow.    2. Metabolic acidosis with anion gap - likely due to renal insufficiency and uremia-leukocytosis normal, continue bicarbonate drip for another day at 75 cc an hour acidosis is improved repeat BMP in the morning.     3. Hypertension- pressure stable and slightly on the higher side we will Put her back on her home medications at full dose, upon admission the dosages for halfed.    4. Diabetes mellitus type 2- oral medications will be held q. a.c. at bedtime sliding scale.   No results found for this basename: HGBA1C    CBG (last 3)   Basename 08/30/11 0757 08/29/11 2138 08/29/11 2007  GLUCAP 161* 111* 129*      5. History of gout no acute issues for now Uloric & NSAIDs will be held.      6. Nausea vomiting diarrhea - no blood or mucus in emesis or stool, no antibiotic a sick exposure, currently symptom-free and tolerating diet for the last 24 hours,  await stool cultures.     7. 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     DVT Prophylaxis   Heparin    Procedures renal ultrasound  Consults nephrology   Thurnell Lose M.D on 08/30/2011 at 8:37 AM  Between 7am to 7pm - Pager - [phone]  After 7pm go to [email or web address] - password [email or web address]  And look for the night coverage person covering for me after hours  [facility] Office  [phone]

## 2011-08-30 NOTE — Progress Notes (Signed)
Pt had eaten lunch before a cbg could be collected.

## 2011-08-31 ENCOUNTER — Inpatient Hospital Stay (HOSPITAL_COMMUNITY): Payer: BC Managed Care – PPO

## 2011-08-31 DIAGNOSIS — N179 Acute kidney failure, unspecified: Secondary | ICD-10-CM

## 2011-08-31 DIAGNOSIS — I10 Essential (primary) hypertension: Secondary | ICD-10-CM

## 2011-08-31 DIAGNOSIS — E860 Dehydration: Secondary | ICD-10-CM

## 2011-08-31 DIAGNOSIS — E875 Hyperkalemia: Secondary | ICD-10-CM

## 2011-08-31 DIAGNOSIS — E119 Type 2 diabetes mellitus without complications: Secondary | ICD-10-CM

## 2011-08-31 DIAGNOSIS — K5289 Other specified noninfective gastroenteritis and colitis: Secondary | ICD-10-CM

## 2011-08-31 LAB — GLUCOSE, CAPILLARY
Glucose-Capillary: 132 mg/dL — ABNORMAL HIGH (ref 70–99)
Glucose-Capillary: 131 mg/dL — ABNORMAL HIGH (ref 70–99)
Glucose-Capillary: 129 mg/dL — ABNORMAL HIGH (ref 70–99)

## 2011-08-31 LAB — BASIC METABOLIC PANEL
CO2: 27 mEq/L (ref 19–32)
GFR calc non Af Amer: 25 mL/min — ABNORMAL LOW (ref >90)
Glucose, Bld: 142 mg/dL — ABNORMAL HIGH (ref 70–99)
Potassium: 3.2 mEq/L — ABNORMAL LOW (ref 3.5–5.1)
Sodium: 141 mEq/L (ref 135–145)

## 2011-08-31 LAB — HEPATIC FUNCTION PANEL
ALT: 41 U/L — ABNORMAL HIGH (ref 0–35)
Alkaline Phosphatase: 106 U/L (ref 39–117)
Bilirubin, Direct: 0.1 mg/dL (ref 0.0–0.3)
Indirect Bilirubin: 0.6 mg/dL (ref 0.3–0.9)
Total Protein: 6.7 g/dL (ref 6.0–8.3)

## 2011-08-31 LAB — CK: Total CK: 454 U/L — ABNORMAL HIGH (ref 7–177)

## 2011-08-31 LAB — LIPID PANEL: Cholesterol: 116 mg/dL (ref 0–200)

## 2011-08-31 LAB — LIPASE, BLOOD: Lipase: 169 U/L — ABNORMAL HIGH (ref 11–59)

## 2011-08-31 IMAGING — US US ABDOMEN COMPLETE
1 series · 14 of 25 positions shown · non-contrast
Comparison: Renal ultrasound [DATE]

CLINICAL DATA: Acute renal failure, pancreatitis

COMPLETE ABDOMINAL ULTRASOUND

[Series 1: us abdomen complete · 0.32mm/px · 14 of 133 slices shown]
[im 1/133]
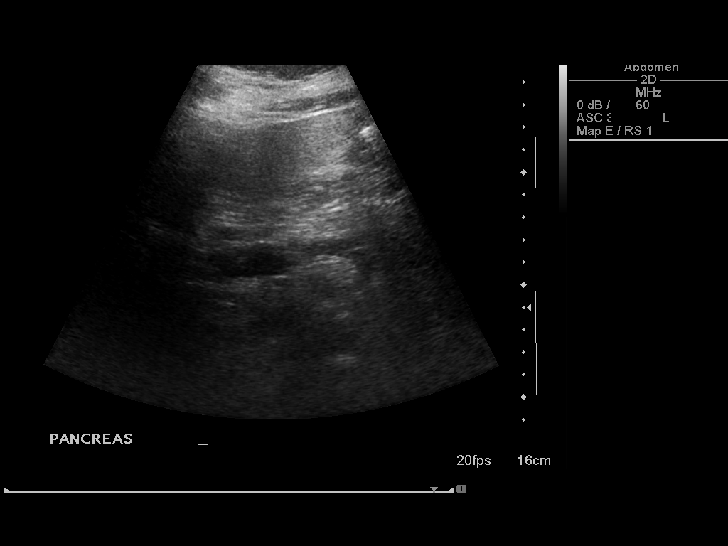
[im 12/133]
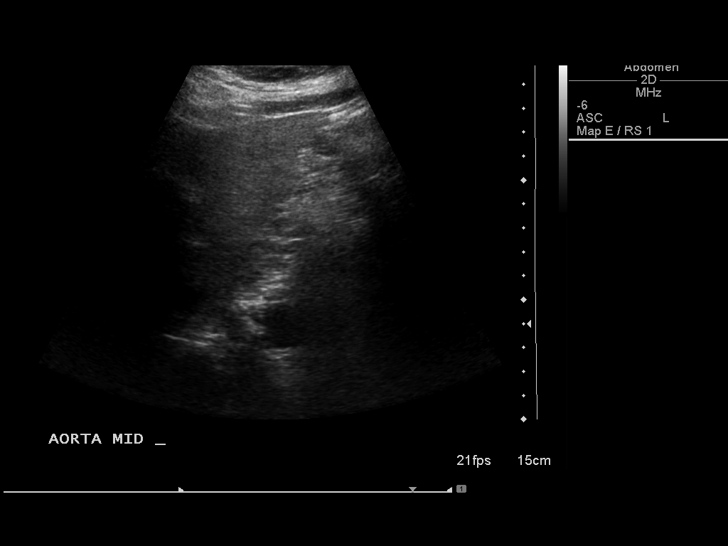
[im 23/133]
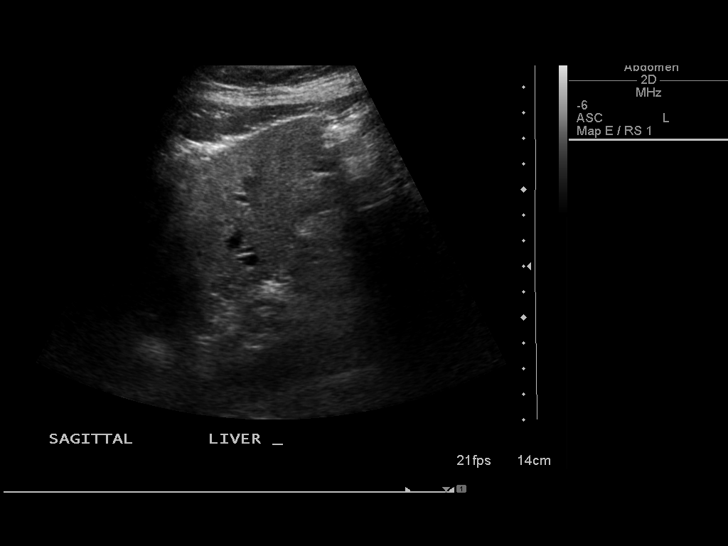
[im 34/133]
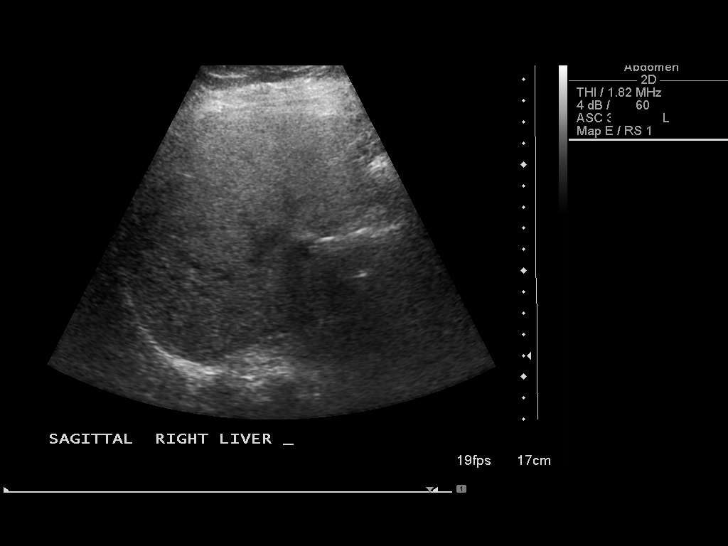
[im 45/133]
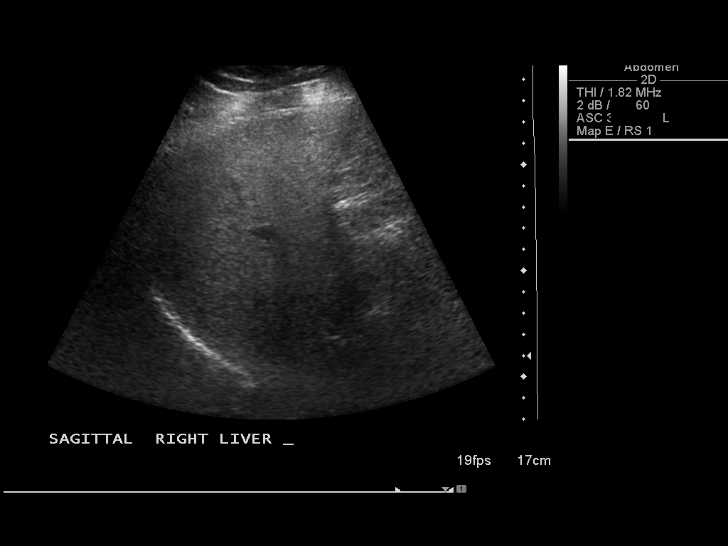
[im 50/133]
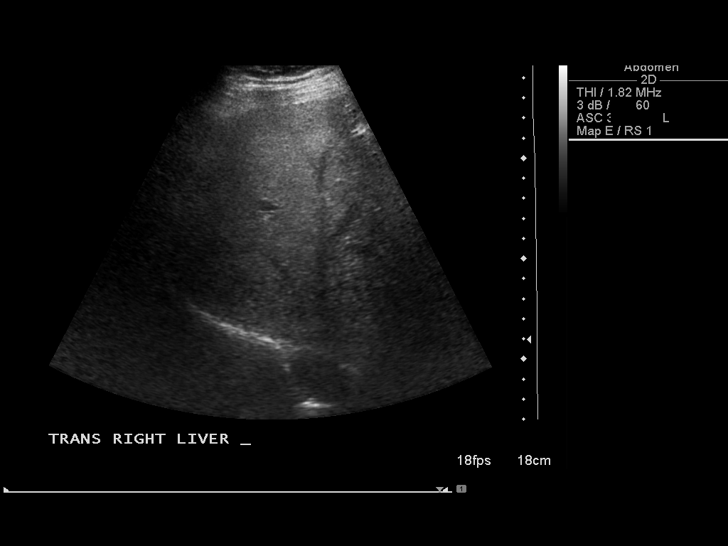
[im 61/133]
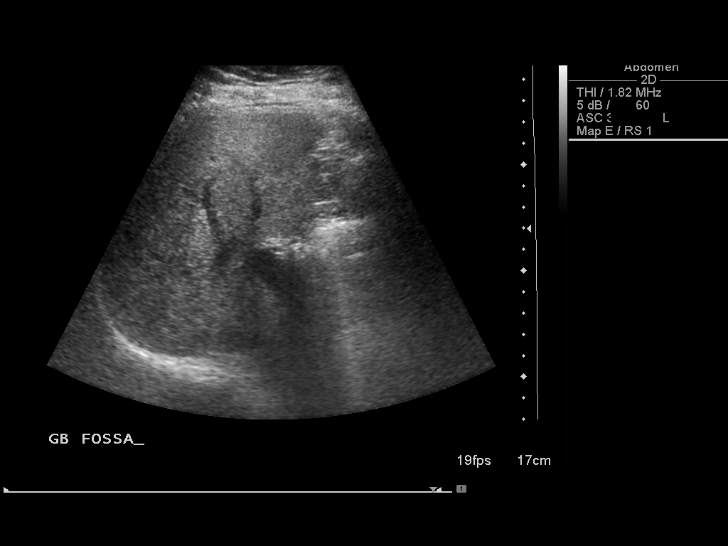
[im 72/133]
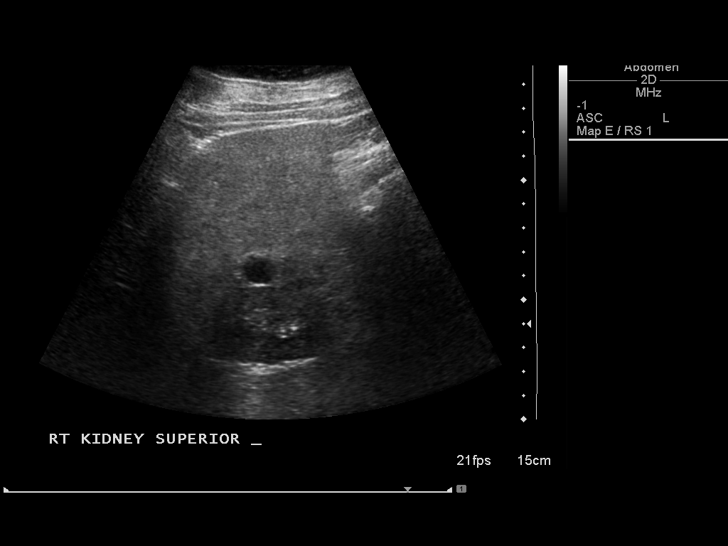
[im 83/133]
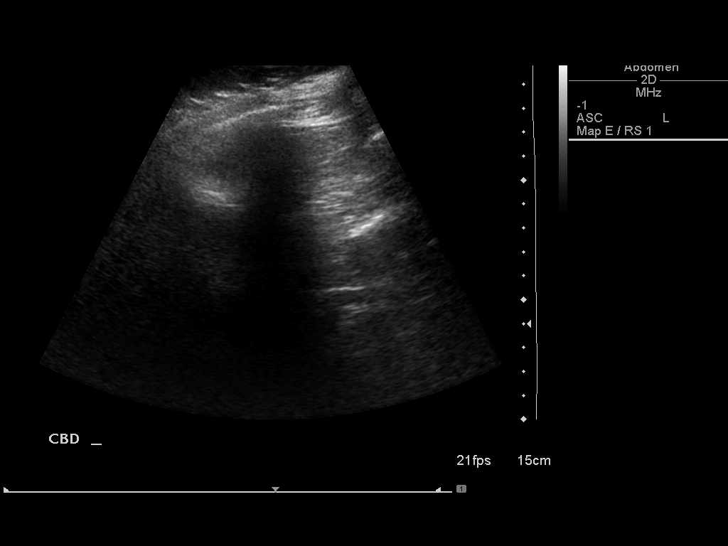
[im 89/133]
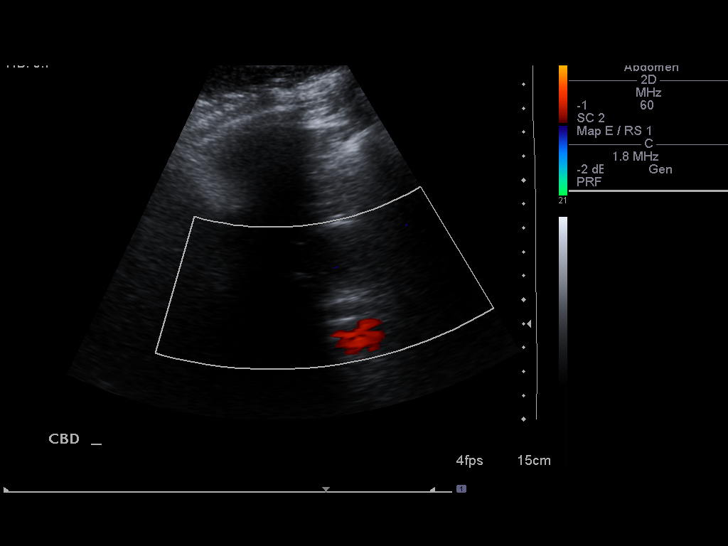
[im 100/133]
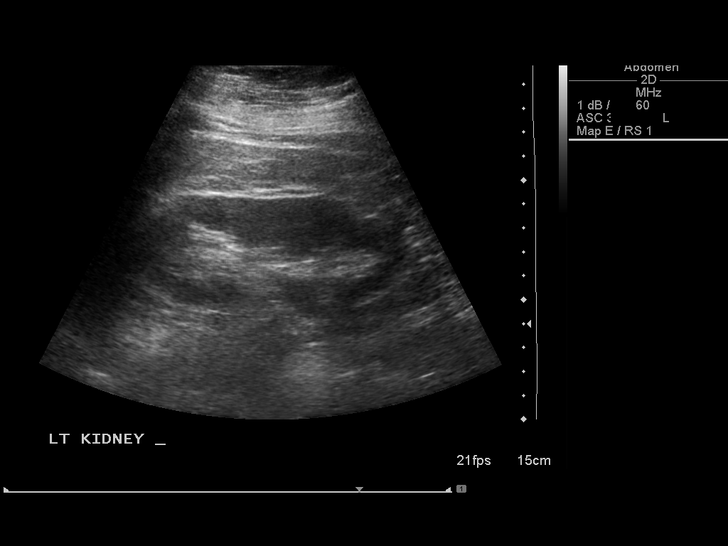
[im 111/133]
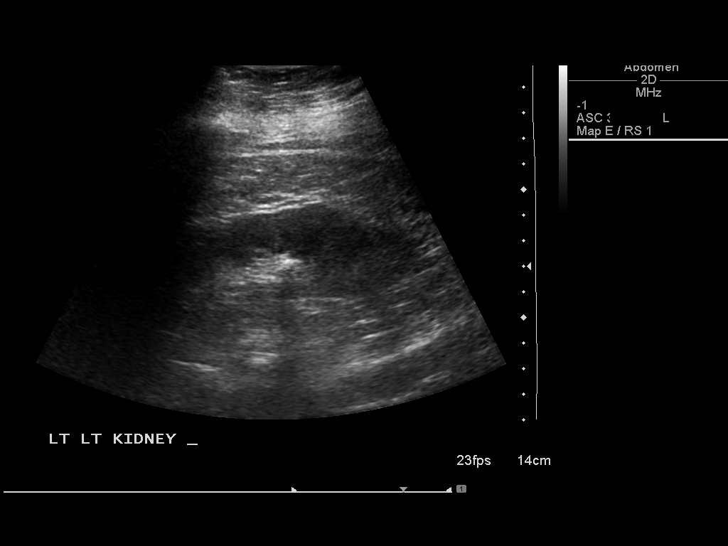
[im 122/133]
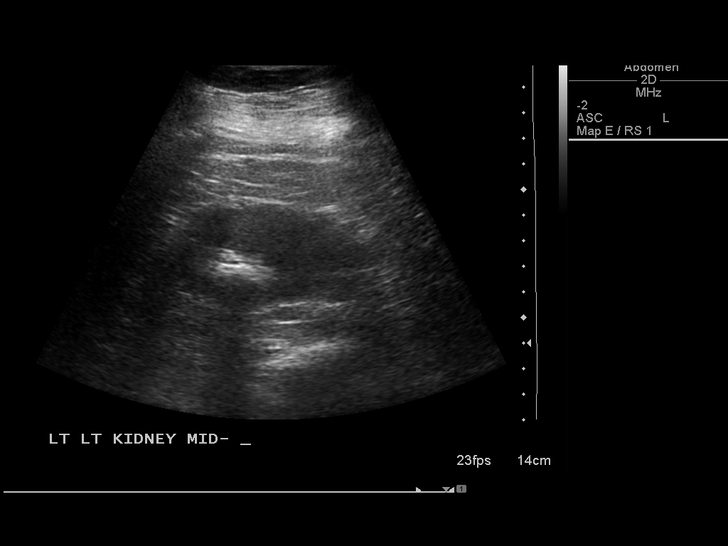
[im 133/133]
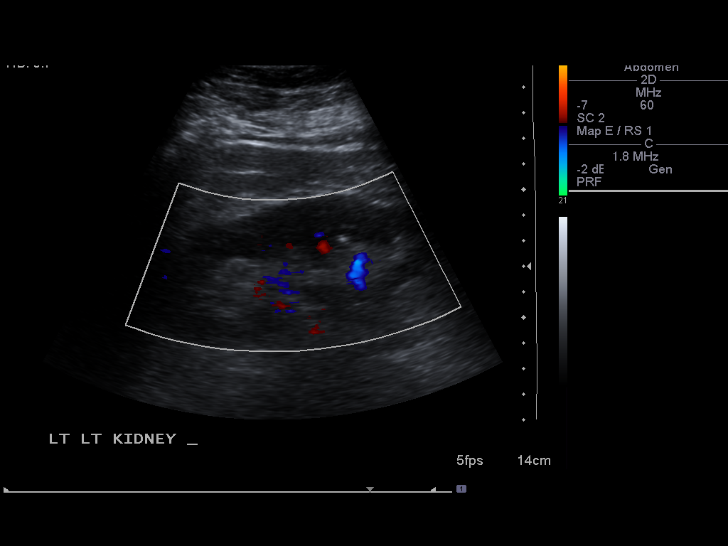

[14 of 25 positions shown; findings below may reference images not displayed]

FINDINGS: Gallbladder:  Surgically absent

Common bile duct:  7.8 mm in diameter

Liver:  No focal lesion identified. Mild increased echogenicity of
the liver suspicious for fatty infiltration.

IVC:  Appears normal.

Pancreas:  Not visualized due to abundant bowel gas.

Spleen:  5.4 cm in length.  Normal echogenicity

Right Kidney: Measures 11.5 cm in length.  No hydronephrosis or
diagnostic renal calculus.  There is a cyst in upper pole measures
1.3 x 1.6 cm.

Left Kidney:  Measures 11.3 cm in length.  No hydronephrosis.
Probable nonobstructive cap calcified calculus mid pole measures 6
mm.  Probable nonobstructive calcified calculus lower pole measures
3.5 mm.

Abdominal aorta:  No aneurysm identified.
IMPRESSION: 1.  Surgically absent gallbladder.  Normal CBD.
2.  Question fatty infiltration of the liver.
3.  No hydronephrosis.  There is a cyst in upper pole of the right
kidney measures 1.6 cm.  Probable left nonobstructive
nephrolithiasis.

## 2011-08-31 IMAGING — CR DG ABDOMEN ACUTE W/ 1V CHEST
4 series · 4 of 4 positions shown · non-contrast
Comparison: None

CLINICAL DATA: Nausea

ACUTE ABDOMEN SERIES (ABDOMEN 2 VIEW & CHEST 1 VIEW)

[w chest pa]
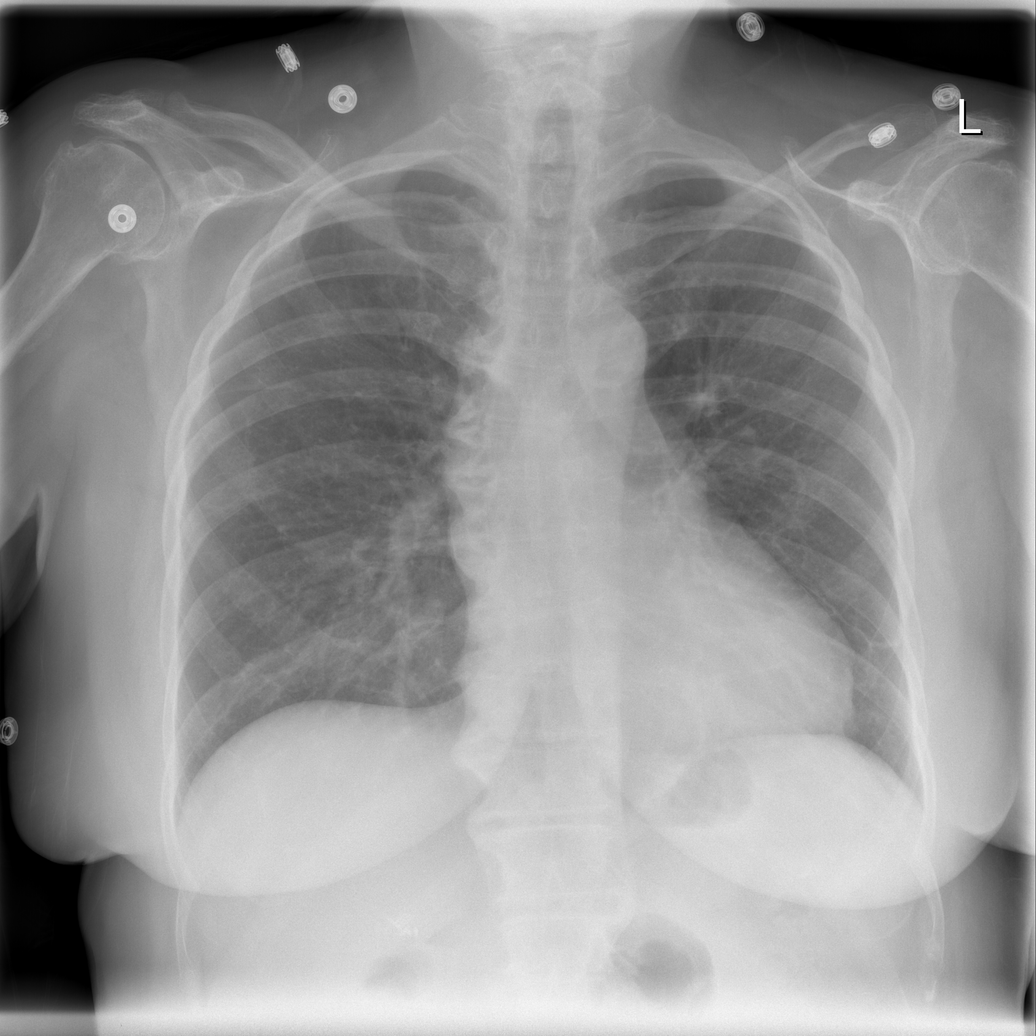

[w abdomen upright *]
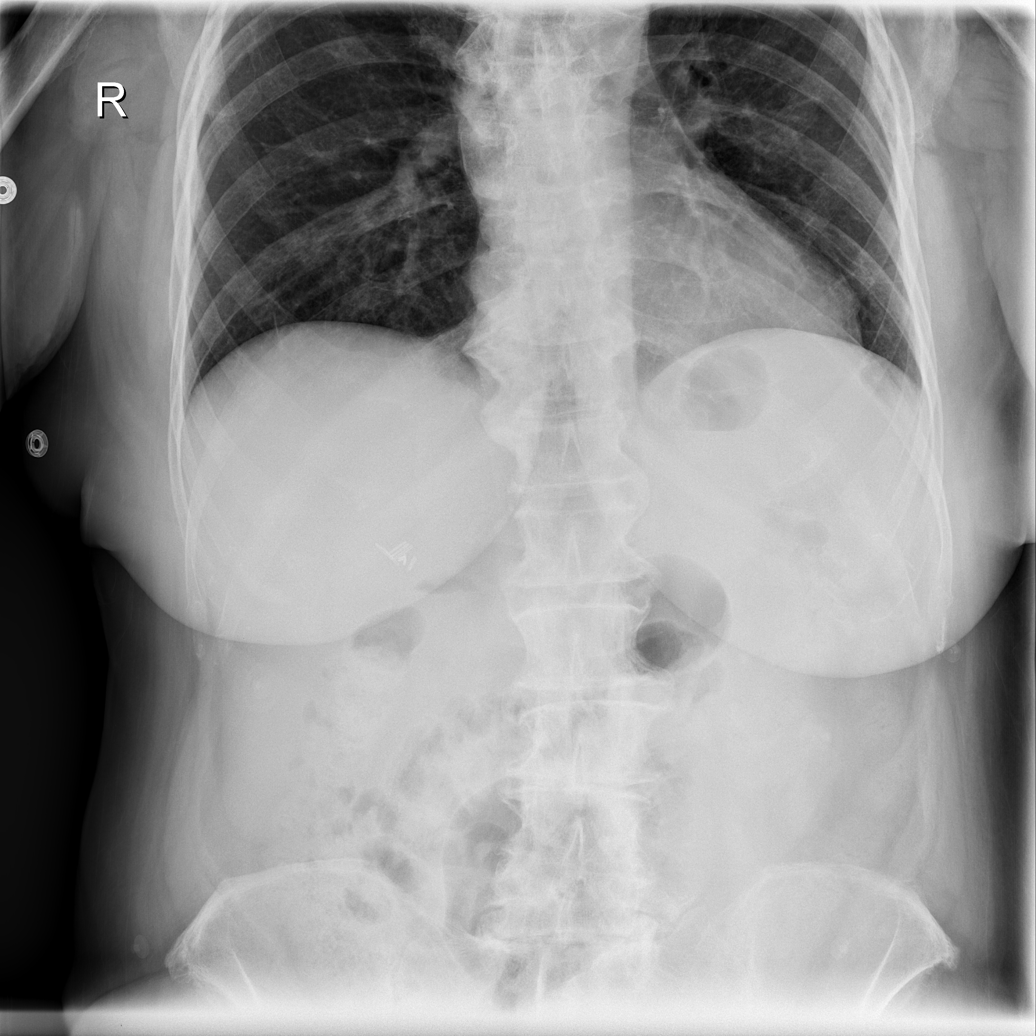

[t abdomen supine (1 of 2)]
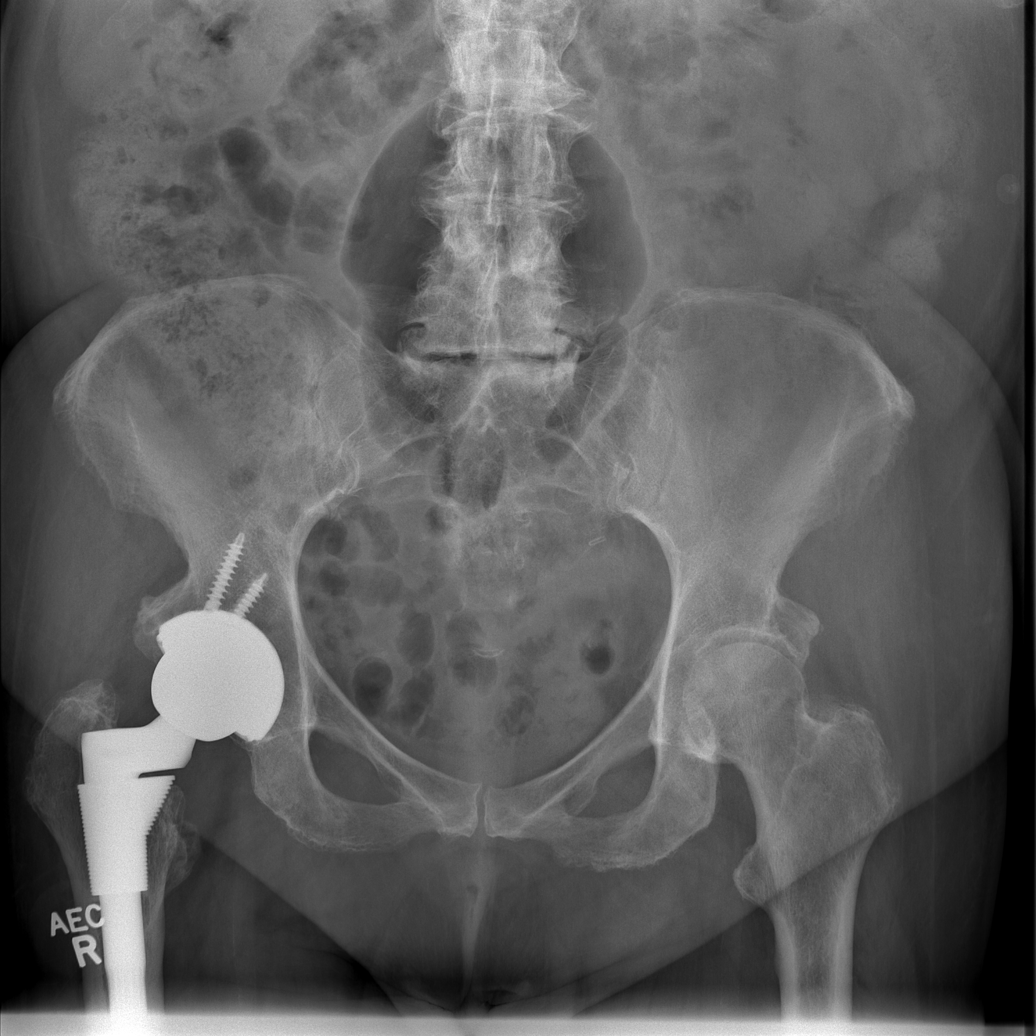

[t abdomen supine (2 of 2)]
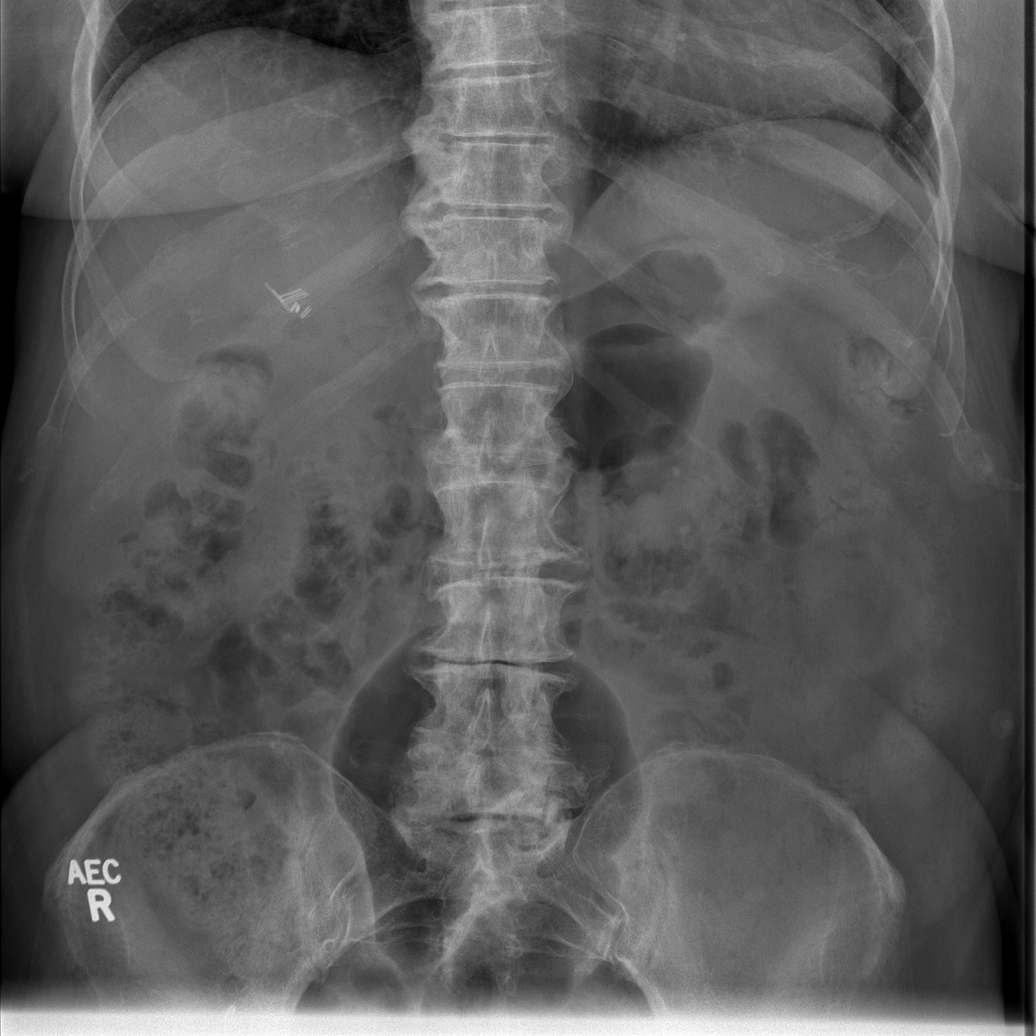

[4 of 4 positions shown; findings below may reference images not displayed]

FINDINGS: Cardiomediastinal silhouette is unremarkable.  No acute
infiltrate or pleural effusion.  No pulmonary edema. Degenerative
changes thoracolumbar spine.

Post cholecystectomy surgical clips are noted.

A right hip prosthesis is noted in anatomic alignment.
Nonobstructive small bowel gas pattern.  No free abdominal air.
There is a gas filled structure in mid lower abdomen measures 15 x
12 cm this may represent focal gaseous sigmoid colon distention.
Colonic ileus cannot be excluded.  Clinical correlation and follow-
up examination is recommended.
IMPRESSION: No acute disease within chest. Nonobstructive small bowel gas
pattern.  No free abdominal air.  There is a gas filled structure
in mid lower abdomen measures 15 x 12 cm this may represent focal
gaseous sigmoid colon distention.  Colonic ileus cannot be
excluded.  Clinical correlation and follow-up examination is
recommended.

## 2011-08-31 MED ORDER — PANTOPRAZOLE SODIUM 40 MG PO TBEC
40.0000 mg | DELAYED_RELEASE_TABLET | Freq: Every day | ORAL | Status: DC
  Administered 2011-09-01 – 2011-09-02 (×2): 40 mg via ORAL
  Filled 2011-08-31 (×2): qty 1

## 2011-08-31 MED ORDER — POTASSIUM CHLORIDE IN NACL 20-0.9 MEQ/L-% IV SOLN
INTRAVENOUS | Status: DC
  Administered 2011-08-31: 50 mL/h via INTRAVENOUS
  Filled 2011-08-31 (×2): qty 1000

## 2011-08-31 MED ORDER — ONDANSETRON HCL 4 MG/2ML IJ SOLN: 4.0000 mg | Freq: Once | INTRAMUSCULAR | Status: DC

## 2011-08-31 MED ORDER — ONDANSETRON HCL 4 MG/2ML IJ SOLN
4.0000 mg | Freq: Once | INTRAMUSCULAR | Status: AC
  Administered 2011-08-31: 4 mg via INTRAVENOUS

## 2011-08-31 MED ORDER — ONDANSETRON HCL 4 MG/2ML IJ SOLN
4.0000 mg | Freq: Four times a day (QID) | INTRAMUSCULAR | Status: DC
  Administered 2011-08-31 – 2011-09-01 (×4): 4 mg via INTRAVENOUS
  Filled 2011-08-31 (×4): qty 2

## 2011-08-31 NOTE — Consult Note (Signed)
Patient seen, examined, and I agree with the above documentation, including the assessment and plan. I do not think she needs endoscopy to help with dx at present.  Renal failure and azotemia likely contributing to her symptoms. We have scheduled anti-emetics.  Start daily PPI for gastric protection. Supportive care

## 2011-08-31 NOTE — Progress Notes (Signed)
With patient in room, when patient c/o nausea. MD at bedside and give verbal order for one time dose of zofran. Vwilliams,rn.

## 2011-08-31 NOTE — Consult Note (Signed)
Referring Provider: [facility] Dr. Candiss Norse.Primary Care Physician:  Coletta Memos Primary Gastroenterologist:  Dr.Jacobs  Reason for Consultation:  Nausea,vomiting, diarrhea  HPI: Jacqueline Richmond is a 66 y.o. female with history of hypertension, adult onset diabetes mellitus, chronic kidney disease stage III and hypertension who was admitted on 08/29/2011 with complaints of an almost 2 week history of nausea vomiting diarrhea and progressive weakness. She has sustained an 18 pound weight loss during that time. Her husband says she has been very weak and a little confused at times. She has not had any documented fever or chills. She denies any abdominal pain, denies heartburn indigestion etc. She says her diarrhea had been watery, malodorous, and nonbloody with multiple episodes per day. She has not had any recent antibiotics since is the only new medication she had started was Colchrys for gout. She had started that just prior to the onset of her symptoms.  Unfortunately on admission she was in renal failure with creatinine of 5.7 and potassium of 7.5 and had a metabolic acidosis. Her renal function is improving. Overall she feels a bit better still denies any abdominal pain but continues with ongoing nausea 24 hours a day and intermittent vomiting. She has only been able to keep crackers down. Her diarrhea has for the most part stopped and she says she's not passing any stool just some mucus at this point.  Stool cultures are pending stool for lactoferrin is positive, her lipase is 169. Prior colonoscopy had been done with Dr. Ardis Hughs in 2009  showing left-sided diverticulosis and one small hyperplastic polyp was removed plan was for followup at 10 years.   Past Medical History  Diagnosis Date  . Hypertension   . Type II or unspecified type diabetes mellitus without mention of complication, not stated as uncontrolled 08/29/2011  . HTN (hypertension) 08/29/2011  . CKD (chronic kidney disease) stage 3, GFR 30-59  ml/min 08/29/2011  . Complication of anesthesia     Past Surgical History  Procedure Date  . Cholecystectomy     Prior to Admission medications   Medication Sig Start Date End Date Taking? Authorizing Provider  aspirin EC 81 MG tablet Take 81 mg by mouth daily.   Yes Historical Provider, MD  atenolol (TENORMIN) 100 MG tablet Take 100 mg by mouth daily.   Yes Historical Provider, MD  CINNAMON PO Take 1 capsule by mouth daily.   Yes Historical Provider, MD  cloNIDine (CATAPRES) 0.3 MG tablet Take 0.3 mg by mouth 2 (two) times daily.   Yes Historical Provider, MD  febuxostat (ULORIC) 40 MG tablet Take 40 mg by mouth daily.   Yes Historical Provider, MD  fish oil-omega-3 fatty acids 1000 MG capsule Take 1 g by mouth daily.   Yes Historical Provider, MD  Garlic Oil (GARLIC [id number]) [id number] MG CAPS Take 2 capsules by mouth daily.   Yes Historical Provider, MD  glipiZIDE (GLUCOTROL XL) 5 MG 24 hr tablet Take 5 mg by mouth daily.   Yes Historical Provider, MD  glipiZIDE (GLUCOTROL) 5 MG tablet Take 5 mg by mouth 2 (two) times daily before a meal.   Yes Historical Provider, MD  indomethacin (INDOCIN) 25 MG capsule Take 25 mg by mouth 2 (two) times daily with a meal.   Yes Historical Provider, MD  lisinopril-hydrochlorothiazide (PRINZIDE,ZESTORETIC) 20-12.5 MG per tablet Take 1 tablet by mouth daily.   Yes Historical Provider, MD  metFORMIN (GLUCOPHAGE) 1000 MG tablet Take 1,000 mg by mouth 2 (two) times daily with a meal.  Yes Historical Provider, MD  niacin 500 MG tablet Take 500 mg by mouth 2 (two) times daily with a meal.   Yes Historical Provider, MD  rosuvastatin (CRESTOR) 10 MG tablet Take 10 mg by mouth daily.   Yes Historical Provider, MD  vitamin B-12 (CYANOCOBALAMIN) 100 MCG tablet Take 100 mcg by mouth daily.   Yes Historical Provider, MD    Current Facility-Administered Medications  Medication Dose Route Frequency Provider Last Rate Last Dose  . 0.9 % NaCl with KCl 20 mEq/ L  infusion    Intravenous Continuous Thurnell Lose, MD 50 mL/hr at 08/31/11 1217 50 mL/hr at 08/31/11 1217  . albuterol (PROVENTIL) (5 MG/ML) 0.5% nebulizer solution 2.5 mg  2.5 mg Nebulization Q2H PRN Thurnell Lose, MD      . atenolol (TENORMIN) tablet 100 mg  100 mg Oral BID Thurnell Lose, MD   100 mg at 08/31/11 0906  . cloNIDine (CATAPRES) tablet 0.2 mg  0.2 mg Oral TID Thurnell Lose, MD   0.2 mg at 08/31/11 0906  . guaiFENesin-dextromethorphan (ROBITUSSIN DM) 100-10 MG/5ML syrup 5 mL  5 mL Oral Q4H PRN Thurnell Lose, MD      . heparin injection 5,000 Units  5,000 Units Subcutaneous Q8H Thurnell Lose, MD   5,000 Units at 08/31/11 1306  . HYDROcodone-acetaminophen (NORCO) 5-325 MG per tablet 1-2 tablet  1-2 tablet Oral Q4H PRN Thurnell Lose, MD   1 tablet at 08/30/11 2009  . insulin aspart (novoLOG) injection 0-9 Units  0-9 Units Subcutaneous TID WC Thurnell Lose, MD   1 Units at 08/31/11 1220  . ondansetron (ZOFRAN) tablet 4 mg  4 mg Oral Q6H PRN Thurnell Lose, MD   4 mg at 08/30/11 1642   Or  . ondansetron (ZOFRAN) injection 4 mg  4 mg Intravenous Q6H PRN Thurnell Lose, MD   4 mg at 08/31/11 0506  . ondansetron (ZOFRAN) injection 4 mg  4 mg Intravenous Once Thurnell Lose, MD   4 mg at 08/31/11 0951  . ondansetron (ZOFRAN) injection 4 mg  4 mg Intravenous Once Thurnell Lose, MD      . sodium bicarbonate 150 mEq in sterile water 1,000 mL infusion   Intravenous Continuous Thurnell Lose, MD 75 mL/hr at 08/30/11 1515    . sodium chloride 0.9 % injection 3 mL  3 mL Intravenous Q12H Thurnell Lose, MD   3 mL at 08/30/11 2351  . vitamin B-12 (CYANOCOBALAMIN) tablet 100 mcg  100 mcg Oral Daily Thurnell Lose, MD   100 mcg at 08/31/11 0906    Allergies as of 08/29/2011 - Review Complete 08/29/2011  Allergen Reaction Noted  . Codeine  12/17/2007  . Pentazocine lactate  12/17/2007    No family history on file.  History   Social History  . Marital Status: Married     Spouse Name: N/A    Number of Children: N/A  . Years of Education: N/A   Occupational History  . Not on file.   Social History Main Topics  . Smoking status: Never Smoker   . Smokeless tobacco: Never Used  . Alcohol Use: Yes     Drinks on Special Occasions  . Drug Use: No  . Sexually Active: No   Other Topics Concern  . Not on file   Social History Narrative  . No narrative on file    Review of Systems: Pertinent positive and negative review of systems  were noted in the above HPI section.  All other review of systems was otherwise negative.  Physical Exam: Vital signs in last 24 hours: Temp:  [97.5 F (36.4 C)-97.8 F (36.6 C)] 97.5 F (36.4 C) (06/09 0514) Pulse Rate:  [72-93] 72  (06/09 0624) Resp:  [20] 20  (06/09 0514) BP: (92-136)/(51-80) 110/70 mmHg (06/09 0624) SpO2:  [94 %-98 %] 94 % (06/09 0514) Weight:  [171 lb 8 oz (77.792 kg)] 171 lb 8 oz (77.792 kg) (06/09 0514) Last BM Date: 08/30/11 General:   Alert,  Well-developed, well-nourished, pleasant and cooperative in NAD Head:  Normocephalic and atraumatic. Eyes:  Sclera clear, no icterus.   Conjunctiva pink. Ears:  Normal auditory acuity. Nose:  No deformity, discharge,  or lesions. Mouth:  No deformity or lesions.   Neck:  Supple; no masses or thyromegaly. Lungs:  Clear throughout to auscultation.   No wheezes, crackles, or rhonchi. Heart:  Regular rate and rhythm; no murmurs, clicks, rubs,  or gallops. Abdomen:  Soft,nontender, BS active,nonpalp mass or hsm.   Rectal:  Deferred  Msk:  Symmetrical without gross deformities. . Pulses:  Normal pulses noted. Extremities:  Without clubbing or edema. Neurologic:  Alert and  oriented x4;  grossly normal neurologically.mentation a little slow Skin:  Intact without significant lesions or rashes.. Psych:  Alert and cooperative. Normal mood and affect.  Intake/Output from previous day: 06/08 0701 - 06/09 0700 In: 2572.5 [P.O.:1080; I.V.:1492.5] Out: 2876  [Urine:2675; Emesis/NG output:200; Stool:1] Intake/Output this shift:    Lab Results:  Basename 08/30/11 0550 08/29/11 1304  WBC 9.7 13.9*  HGB 12.7 14.5  HCT 38.5 43.8  PLT 225 252   BMET  Basename 08/31/11 0528 08/30/11 0550 08/30/11 0231 08/29/11 1551  NA 141 145 -- 137  K 3.2* 3.9 4.0 --  CL 101 110 -- 108  CO2 27 16* -- 9*  GLUCOSE 142* 157* -- 216*  BUN 79* 118* -- 143*  CREATININE 2.00* 3.97* -- 5.34*  CALCIUM 8.2* 9.5 -- 9.3   LFT  Basename 08/31/11 1010  PROT 6.7  ALBUMIN 3.1*  AST 44*  ALT 41*  ALKPHOS 106  BILITOT 0.7  BILIDIR 0.1  IBILI 0.6   PT/INR  Basename 08/30/11 0550  LABPROT 14.9  INR 1.15        Studies/Results: US Renal  08/29/2011  *RADIOLOGY REPORT*  Clinical Data: Acute renal failure  RENAL / URINARY TRACT ULTRASOUND  Technique:  Complete ultrasound exam of the kidneys and urinary bladder was performed.  Comparison:  No comparison studies available.  Findings:  The right kidney measures 12.2 cm in long axis.  The left kidney measures 12.5 cm.  There is no evidence for hydronephrosis in either kidney.  15 mm probable cyst noted in the upper pole of the right kidney.  Midline imaging through the pelvis reveals a decompressed urinary bladder secondary to Foley catheter placement.  Impression:  No evidence for hydronephrosis.  Original Report Authenticated By: ERIC A. MANSELL, M.D.   Dg Abd Acute W/chest  08/31/2011  *RADIOLOGY REPORT*  Clinical Data: Nausea  ACUTE ABDOMEN SERIES (ABDOMEN 2 VIEW & CHEST 1 VIEW)  Comparison: None  Findings: Cardiomediastinal silhouette is unremarkable.  No acute infiltrate or pleural effusion.  No pulmonary edema. Degenerative changes thoracolumbar spine.  Post cholecystectomy surgical clips are noted.  A right hip prosthesis is noted in anatomic alignment. Nonobstructive small bowel gas pattern.  No free abdominal air. There is a gas filled structure in mid lower abdomen  measures 15 x 12 cm this may represent  focal gaseous sigmoid colon distention. Colonic ileus cannot be excluded.  Clinical correlation and follow- up examination is recommended.  IMPRESSION: No acute disease within chest. Nonobstructive small bowel gas pattern.  No free abdominal air.  There is a gas filled structure in mid lower abdomen measures 15 x 12 cm this may represent focal gaseous sigmoid colon distention.  Colonic ileus cannot be excluded.  Clinical correlation and follow-up examination is recommended.  Original Report Authenticated By: Lahoma Crocker, M.D.    IMPRESSION:  #5 66 year old female with 2 week history of acute illness with nausea, vomiting ,diarrhea, generalized weakness, weight loss ,and intermittent confusion in the setting of underlying diabetes and chronic kidney disease stage III. Patient presented in acute renal failure, with metabolic acidosis, hyperkalemia and uremia.  Her parameters are all improving and diarrhea has resolved. Current illness may have been triggered by colchicine been followed by renal failure. I doubt this is infectious-stool cultures etc. are pending. #2 hypertension #3 history of gout #4 hyperplastic colon polyps, and diverticulosis on colonoscopy 2009  PLAN: Would manage supportively for now until creatinine returns to her baseline. Will make Zofran around-the-clock, and change diet to full liquid Add protonix 40 mg po daily We will follow along, and if her symptoms do not resolve in the next few days can consider endoscopy.    Willie Loy  08/31/2011, 1:06 PM

## 2011-08-31 NOTE — Progress Notes (Signed)
Catheter DCd. Vwilliams,rn.

## 2011-08-31 NOTE — Evaluation (Signed)
Physical Therapy Evaluation Patient Details Name: Jacqueline Richmond MRN: [id number] DOB: Aug 12, 1945 Today's Date: 08/31/2011 Time: [id number] PT Time Calculation (min): 20 min  PT Assessment / Plan / Recommendation Clinical Impression  pt was admitted w/ 2 week h/o N/V/D, weakness. pt will benefit from PT to return to [place] to return home.    PT Assessment  Patient needs continued PT services    Follow Up Recommendations  No PT follow up;Supervision - Intermittent    Barriers to Discharge        lEquipment Recommendations  None recommended by PT    Recommendations for Other Services     Frequency Min 3X/week    Precautions / Restrictions   contact  Pertinent Vitals/Pain      Mobility  Bed Mobility Bed Mobility: Supine to Sit Supine to Sit: With rails;4: Min guard Details for Bed Mobility Assistance: pt reports being very weak and  Transfers Transfers: Sit to Stand;Stand to Sit Sit to Stand: From bed;1: +2 Total assist;With upper extremity assist Sit to Stand: Patient Percentage: 70% Stand to Sit: 4: Min assist;To chair/3-in-1 Details for Transfer Assistance: pt required extra time to stand, made 2 attempts to get to a standing position Ambulation/Gait Ambulation/Gait Assistance: 1: +2 Total assist Ambulation/Gait: Patient Percentage: 70% Ambulation Distance (Feet): 60 Feet Assistive device: Rolling walker Ambulation/Gait Assistance Details: pt walked slowly w/ RW, +2 for safety due to weakness. Gait Pattern: Step-through pattern General Gait Details: pt + emesis after return to room.    Exercises     PT Diagnosis: Difficulty walking;Generalized weakness  PT Problem List: Decreased strength;Decreased activity tolerance PT Treatment Interventions: Gait training;Functional mobility training;Therapeutic activities;Patient/family education   PT Goals Acute Rehab PT Goals PT Goal Formulation: With patient Time For Goal Achievement: 09/14/11 Potential to  Achieve Goals: Good Pt will go Supine/Side to Sit: Independently PT Goal: Supine/Side to Sit - Progress: Goal set today Pt will go Sit to Supine/Side: Independently PT Goal: Sit to Supine/Side - Progress: Goal set today Pt will go Sit to Stand: with supervision PT Goal: Sit to Stand - Progress: Goal set today Pt will go Stand to Sit: with supervision PT Goal: Stand to Sit - Progress: Goal set today Pt will Ambulate: 51 - 150 feet;with supervision PT Goal: Ambulate - Progress: Goal set today  Visit Information  Last PT Received On: 08/31/11 Assistance Needed: +2    Subjective Data  Subjective: i have had N/D for 2 weeks. Patient Stated Goal: I want to get better.   Prior Functioning  Home Living Lives With: Spouse Type of Home: House Home Access: Stairs to enter;Level entry Home Layout: One level Biochemist, clinical: Standard Home Adaptive Equipment: None Prior Function Level of [place]: [place] Vocation: Full time employment Communication Communication: No difficulties    Cognition  Overall Cognitive Status: Appears within functional limits for tasks assessed/performed Arousal/Alertness: Awake/alert Orientation Level: Appears intact for tasks assessed Behavior During Session: [facility] for tasks performed    Extremity/Trunk Assessment Right Upper Extremity Assessment RUE ROM/Strength/Tone: [facility] for tasks assessed Left Upper Extremity Assessment LUE ROM/Strength/Tone: WFL for tasks assessed Right Lower Extremity Assessment RLE ROM/Strength/Tone: [facility] for tasks assessed Left Lower Extremity Assessment LLE ROM/Strength/Tone: WFL for tasks assessed Trunk Assessment Trunk Assessment: Normal   Balance    End of Session PT - End of Session Activity Tolerance: Patient limited by fatigue;Treatment limited secondary to medical complications (Comment) Patient left: in chair;with call bell/phone within reach Nurse Communication: Mobility status   Berdine Addison Shella Maxim  08/31/2011, 11:59 AM  [id number]

## 2011-08-31 NOTE — Progress Notes (Signed)
Pt with foley cath. Reason questioned. MD notified if okay to remove. MD said ok to remove foley. Vwilliams,rn.

## 2011-08-31 NOTE — Progress Notes (Signed)
[facility]                                                                                 Patient Demographics  Jacqueline Richmond, is a 66 y.o. female  [id number]  [id number]  DOB - April 27, 1945  Admit date - 08/29/2011  Admitting Physician Thurnell Lose, MD  Outpatient Primary MD for the patient is No primary provider on file.  LOS - 2    Chief Complaint  Patient presents with  . Nausea  . Emesis  . Diarrhea        Subjective:   Jacqueline Richmond today has, No headache, No chest pain, No abdominal pain - +ve Nausea, No new weakness tingling or numbness, No Cough - SOB.   Objective:   Filed Vitals:   08/30/11 1400 08/30/11 2124 08/31/11 0514 08/31/11 0624  BP: 136/80 118/74 92/51 110/70  Pulse: 80 93 78 72  Temp: 97.6 F (36.4 C) 97.8 F (36.6 C) 97.5 F (36.4 C)   TempSrc: Oral Oral Oral   Resp: 20 20 20    Height:      Weight:   77.792 kg (171 lb 8 oz)   SpO2: 98% 97% 94%     Wt Readings from Last 3 Encounters:  08/31/11 77.792 kg (171 lb 8 oz)     Intake/Output Summary (Last 24 hours) at 08/31/11 1056 Last data filed at 08/31/11 0700  Gross per 24 hour  Intake 2212.5 ml  Output   2875 ml  Net -662.5 ml    Exam Awake Alert, Oriented *3, No new F.N deficits, Normal affect [place].AT,PERRAL Supple Neck,No JVD, No cervical lymphadenopathy appriciated.  Symmetrical Chest wall movement, Good air movement bilaterally, CTAB RRR,No Gallops,Rubs or new Murmurs, No Parasternal Heave +ve B.Sounds, Abd Soft, Non tender, No organomegaly appriciated, No rebound -guarding or rigidity. No Cyanosis, Clubbing or edema, No new Rash or bruise    Data Review  CBC  Lab 08/30/11 0550 08/29/11 1304  WBC 9.7 13.9*  HGB 12.7 14.5  HCT 38.5 43.8  PLT 225 252  MCV 77.2* 79.1  MCH 25.5* 26.2  MCHC 33.0 33.1  RDW 15.3 15.0  LYMPHSABS -- 1.0  MONOABS -- 0.3  EOSABS -- 0.0  BASOSABS -- 0.0  BANDABS -- --    Chemistries   Lab 08/31/11 1010 08/31/11  0528 08/30/11 0550 08/30/11 0231 08/30/11 0015 08/29/11 2158 08/29/11 1600 08/29/11 1551 08/29/11 1304  NA -- 141 145 -- -- -- -- 137 133*  K -- 3.2* 3.9 4.0 4.2 4.7 -- -- --  CL -- 101 110 -- -- -- -- 108 102  CO2 -- 27 16* -- -- -- -- 9* 7*  GLUCOSE -- 142* 157* -- -- -- -- 216* 159*  BUN -- 79* 118* -- -- -- -- 143* 146*  CREATININE -- 2.00* 3.97* -- -- -- -- 5.34* 5.87*  CALCIUM -- 8.2* 9.5 -- -- -- -- 9.3 9.7  MG -- -- -- -- -- -- -- -- --  AST 44* -- -- -- -- -- 70* -- --  ALT 41* -- -- -- -- -- 53* -- --  ALKPHOS 106 -- -- -- -- --  134* -- --  BILITOT 0.7 -- -- -- -- -- 0.3 -- --   ------------------------------------------------------------------------------------------------------------------ estimated creatinine clearance is 27.1 ml/min (by C-G formula based on Cr of 2). ------------------------------------------------------------------------------------------------------------------  [facility] 08/29/11 1304  HGBA1C 7.0*   ------------------------------------------------------------------------------------------------------------------ No results found for this basename: CHOL:2,HDL:2,LDLCALC:2,TRIG:2,CHOLHDL:2,LDLDIRECT:2 in the last 72 hours ------------------------------------------------------------------------------------------------------------------  [facility] 08/29/11 1304  TSH 0.377  T4TOTAL --  T3FREE --  THYROIDAB --   ------------------------------------------------------------------------------------------------------------------ No results found for this basename: VITAMINB12:2,FOLATE:2,FERRITIN:2,TIBC:2,IRON:2,RETICCTPCT:2 in the last 72 hours  Coagulation profile  Lab 08/30/11 0550  INR 1.15  PROTIME --    No results found for this basename: DDIMER:2 in the last 72 hours  Cardiac Enzymes No results found for this basename: CK:3,CKMB:3,TROPONINI:3,MYOGLOBIN:3 in the last 168  hours ------------------------------------------------------------------------------------------------------------------ No components found with this basename: POCBNP:3  Micro Results No results found for this or any previous visit (from the past 240 hour(s)).  Radiology Reports US Renal  08/29/2011  *RADIOLOGY REPORT*  Clinical Data: Acute renal failure  RENAL / URINARY TRACT ULTRASOUND  Technique:  Complete ultrasound exam of the kidneys and urinary bladder was performed.  Comparison:  No comparison studies available.  Findings:  The right kidney measures 12.2 cm in long axis.  The left kidney measures 12.5 cm.  There is no evidence for hydronephrosis in either kidney.  15 mm probable cyst noted in the upper pole of the right kidney.  Midline imaging through the pelvis reveals a decompressed urinary bladder secondary to Foley catheter placement.  Impression:  No evidence for hydronephrosis.  Original Report Authenticated By: ERIC A. MANSELL, M.D.    Scheduled Meds:    . atenolol  100 mg Oral BID  . cloNIDine  0.2 mg Oral TID  . heparin  5,000 Units Subcutaneous Q8H  . insulin aspart  0-9 Units Subcutaneous TID WC  . ondansetron  4 mg Intravenous Once  . sodium chloride  3 mL Intravenous Q12H  . vitamin B-12  100 mcg Oral Daily   Continuous Infusions:    . 0.9 % NaCl with KCl 20 mEq / L    .  sodium bicarbonate infusion sterile water 1000 mL 75 mL/hr at 08/30/11 1515   PRN Meds:.albuterol, guaiFENesin-dextromethorphan, HYDROcodone-acetaminophen, ondansetron (ZOFRAN) IV, ondansetron  Assessment & Plan   1. Acute renal failure and hyperkalemia in a patient with chronic kidney disease stage III baseline creatinine 1.7 (hypertension, diabetes, gout, nephrotoxic medications). Likely worsened by nausea vomiting diarrhea and dehydration, her nephrotoxic medications i.e. NSAIDs, ACE inhibitor, diuretics, Urolic will all be held. She has been kept on IV bicarbonate drip with good results,  potassium and creatinine have improved, renal ultrasound stable, UA noted, await myoglobin in urine, CK was elevated likely due to being on urolic and statin, for now we'll continue IV fluids, serially monitor BMP on a daily basis, likely will plateau around 2.5-3, reduce bicarbonate rate to 75 cc an hour for another day, renal to follow.    2. Metabolic acidosis with anion gap - likely due to renal insufficiency and uremia-leukocytosis normal, continue bicarbonate drip for another day at 75 cc an hour acidosis is improved repeat BMP in the morning.     3. Hypertension- pressure stable and slightly on the higher side we will Put her back on her home medications at full dose, upon admission the dosages for halfed.    4. Diabetes mellitus type 2- oral medications will be held q. a.c. at bedtime sliding scale.   Lab Results  Component Value Date  HGBA1C 7.0* 08/29/2011    CBG (last 3)   Basename 08/31/11 0805 08/30/11 1707 08/30/11 1310  GLUCAP 132* 123* 181*      5. History of gout no acute issues for now Uloric & NSAIDs will be held.      6. Nausea vomiting diarrhea - no blood or mucus in emesis or stool, no antibiotic a sick exposure, is not complaining of abdominal pain but nauseated again today, stat lipase was checked which was 169 i.e. almost 3 times normal value, raising question for pancreatitis, and check a stat right upper quadrant ultrasound and check a stat lipid panel, change diet to clears, request GI to see the patient. For nausea we'll check a KUB, question need for CT scan.    7. 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     DVT Prophylaxis   Heparin    Procedures renal ultrasound  Consults nephrology   Thurnell Lose M.D on 08/31/2011 at 10:56 AM  Between 7am to 7pm - Pager - [phone]  After 7pm go to [email or web address] - password [email or web address]  And look for the night coverage person covering for me after hours  [facility] Office  [phone]

## 2011-08-31 NOTE — Progress Notes (Signed)
Late entry: pt placed on contact precautions earlier in shift per infection control policy  to rule out cdiff.Marland Kitcheneducation done. Patient voices understanding.

## 2011-09-01 DIAGNOSIS — E119 Type 2 diabetes mellitus without complications: Secondary | ICD-10-CM

## 2011-09-01 DIAGNOSIS — E875 Hyperkalemia: Secondary | ICD-10-CM

## 2011-09-01 DIAGNOSIS — N179 Acute kidney failure, unspecified: Secondary | ICD-10-CM

## 2011-09-01 DIAGNOSIS — I10 Essential (primary) hypertension: Secondary | ICD-10-CM

## 2011-09-01 LAB — CBC
HCT: 32.5 % — ABNORMAL LOW (ref 36.0–46.0)
MCH: 25.1 pg — ABNORMAL LOW (ref 26.0–34.0)
MCV: 77.6 fL — ABNORMAL LOW (ref 78.0–100.0)
Platelets: 152 10*3/uL (ref 150–400)
RDW: 14.3 % (ref 11.5–15.5)
WBC: 6.2 10*3/uL (ref 4.0–10.5)

## 2011-09-01 LAB — COMPREHENSIVE METABOLIC PANEL
AST: 42 U/L — ABNORMAL HIGH (ref 0–37)
Albumin: 2.8 g/dL — ABNORMAL LOW (ref 3.5–5.2)
Alkaline Phosphatase: 96 U/L (ref 39–117)
BUN: 49 mg/dL — ABNORMAL HIGH (ref 6–23)
Chloride: 100 mEq/L (ref 96–112)
Potassium: 2.7 mEq/L — CL (ref 3.5–5.1)
Sodium: 141 mEq/L (ref 135–145)
Total Bilirubin: 0.7 mg/dL (ref 0.3–1.2)
Total Protein: 6.2 g/dL (ref 6.0–8.3)

## 2011-09-01 LAB — LIPASE, BLOOD: Lipase: 75 U/L — ABNORMAL HIGH (ref 11–59)

## 2011-09-01 LAB — HEPATITIS PANEL, ACUTE: Hep A IgM: NEGATIVE

## 2011-09-01 LAB — POTASSIUM: Potassium: 3.3 mEq/L — ABNORMAL LOW (ref 3.5–5.1)

## 2011-09-01 LAB — GLUCOSE, CAPILLARY
Glucose-Capillary: 118 mg/dL — ABNORMAL HIGH (ref 70–99)
Glucose-Capillary: 143 mg/dL — ABNORMAL HIGH (ref 70–99)
Glucose-Capillary: 154 mg/dL — ABNORMAL HIGH (ref 70–99)

## 2011-09-01 MED ORDER — FENOFIBRATE 54 MG PO TABS
54.0000 mg | ORAL_TABLET | Freq: Every day | ORAL | Status: DC
  Administered 2011-09-01: 54 mg via ORAL
  Filled 2011-09-01 (×2): qty 1

## 2011-09-01 MED ORDER — ONDANSETRON HCL 4 MG/2ML IJ SOLN
4.0000 mg | Freq: Two times a day (BID) | INTRAMUSCULAR | Status: DC
  Administered 2011-09-01: 4 mg via INTRAVENOUS
  Filled 2011-09-01: qty 2

## 2011-09-01 MED ORDER — ONDANSETRON HCL 4 MG PO TABS
4.0000 mg | ORAL_TABLET | Freq: Two times a day (BID) | ORAL | Status: DC
  Administered 2011-09-01 – 2011-09-02 (×2): 4 mg via ORAL
  Filled 2011-09-01 (×3): qty 1

## 2011-09-01 MED ORDER — POTASSIUM CHLORIDE 10 MEQ/100ML IV SOLN
10.0000 meq | INTRAVENOUS | Status: AC
  Administered 2011-09-01 (×4): 10 meq via INTRAVENOUS
  Filled 2011-09-01 (×4): qty 100

## 2011-09-01 MED ORDER — SODIUM CHLORIDE 0.9 % IV SOLN
INTRAVENOUS | Status: AC
  Administered 2011-09-01: 75 mL/h via INTRAVENOUS

## 2011-09-01 MED ORDER — MAGNESIUM SULFATE 40 MG/ML IJ SOLN
2.0000 g | Freq: Once | INTRAMUSCULAR | Status: AC
  Administered 2011-09-01: 2 g via INTRAVENOUS
  Filled 2011-09-01: qty 50

## 2011-09-01 MED ORDER — POTASSIUM CHLORIDE CRYS ER 20 MEQ PO TBCR
40.0000 meq | EXTENDED_RELEASE_TABLET | Freq: Once | ORAL | Status: AC
  Administered 2011-09-01: 40 meq via ORAL
  Filled 2011-09-01 (×2): qty 2

## 2011-09-01 NOTE — Progress Notes (Signed)
   CARE MANAGEMENT NOTE 09/01/2011  Patient:  Jacqueline Richmond, Jacqueline Richmond   Account Number:  [id number]  Date Initiated:  09/01/2011  Documentation initiated by:  Olga Coaster  Subjective/Objective Assessment:   ADMITTED WITH Acute renal failure and hyperkalemia     Action/Plan:   PCP IS Dr Coletta Memos; [place]; [place] PRIOR TO ADMISSION   Anticipated DC Date:  09/08/2011   Anticipated DC Plan:  [place]  CM consult            Status of service:  In process, will continue to follow Medicare Important Message given?   (If response is "NO", the following Medicare IM given date fields will be blank)  Per UR Regulation:  Reviewed for med. necessity/level of care/duration of stay  If discussed at [place] of Stay Meetings, dates discussed:    Comments:  09/01/2011- B Javaun Dimperio RN, BSN, MHA

## 2011-09-01 NOTE — Progress Notes (Signed)
[facility] Progress Note   Subjective: No diarrhea in 1-2 days now, still with nasuea and vomits if she eats.  No abd pains. Weak+   Objective: Vital signs in last 24 hours: Temp:  [97.4 F (36.3 C)-97.7 F (36.5 C)] 97.7 F (36.5 C) (06/10 0509) Pulse Rate:  [66-71] 69  (06/10 0509) Resp:  [18] 18  (06/10 0509) BP: (127-155)/(71-83) 155/75 mmHg (06/10 0509) SpO2:  [95 %-96 %] 95 % (06/10 0509) Weight:  [171 lb 11.2 oz (77.883 kg)] 171 lb 11.2 oz (77.883 kg) (06/10 0509) Last BM Date: 08/31/11 General: alert and oriented times 3 Heart: regular rate and rythm Abdomen: soft, non-tender, non-distended, normal bowel sounds    Lab Results:  Basename 09/01/11 0455 08/30/11 0550 08/29/11 1304  WBC 6.2 9.7 13.9*  HGB 10.5* 12.7 14.5  PLT 152 225 252  MCV 77.6* 77.2* 79.1    Basename 09/01/11 0455 08/31/11 0528 08/30/11 0550  NA 141 141 145  K 2.7* 3.2* 3.9  CL 100 101 110  CO2 31 27 16*  GLUCOSE 136* 142* 157*  BUN 49* 79* 118*  CREATININE 1.43* 2.00* 3.97*  CALCIUM 8.2* 8.2* 9.5    Basename 09/01/11 0455 08/31/11 1010 08/29/11 1600  PROT 6.2 6.7 7.4  ALBUMIN 2.8* 3.1* 3.3*  AST 42* 44* 70*  ALT 37* 41* 53*  ALKPHOS 96 106 134*  BILITOT 0.7 0.7 0.3  BILIDIR -- 0.1 <0.1  IBILI -- 0.6 NOT CALCULATED    Basename 08/30/11 0550  INR 1.15     Assessment/Plan: 66 y.o. female likely side effect of colchicine (diarrhea, n/vomiting all started within a week of her starting the med)  Diarrhea has resolved.  Will change zofran to twice daily Scheduled dosing rather than PRN and hopefully her appetite will improve.  Will advance diet as tolerated.  Most importantly, her renal failure is resolving.  Owens Loffler, MD  09/01/2011, 8:48 AM [facility] Pager [phone]

## 2011-09-01 NOTE — Progress Notes (Signed)
CRITICAL VALUE ALERT  Critical value received:  K-2.7  Date of notification:  09/01/2011  Time of notification:  0610  Critical value read back:yes  Nurse who received alert:  Roslyn Smiling  MD notified (1st page):  Kathline Magic, NP  Time of first page:  0615  MD notified (2nd page)  Time of second page  Responding MD:  Kathline Magic, NP  Time MD responded: [phone]

## 2011-09-01 NOTE — Progress Notes (Signed)
[facility]                                                                                 Patient Demographics  Jacqueline Richmond, is a 66 y.o. female  [id number]  [id number]  DOB - 27-Jun-1945  Admit date - 08/29/2011  Admitting Physician Thurnell Lose, MD  Outpatient Primary MD for the patient is No primary provider on file.  LOS - 3    Chief Complaint  Patient presents with  . Nausea  . Emesis  . Diarrhea        Subjective:   Jacqueline Richmond today has, No headache, No chest pain, No abdominal pain - +ve Nausea, No new weakness tingling or numbness, No Cough - SOB.   Objective:   Filed Vitals:   08/31/11 0624 08/31/11 1400 08/31/11 2125 09/01/11 0509  BP: 110/70 127/71 155/83 155/75  Pulse: 72 71 66 69  Temp:  97.4 F (36.3 C) 97.7 F (36.5 C) 97.7 F (36.5 C)  TempSrc:  Oral Oral Oral  Resp:  18 18 18   Height:      Weight:    77.883 kg (171 lb 11.2 oz)  SpO2:  96% 96% 95%    Wt Readings from Last 3 Encounters:  09/01/11 77.883 kg (171 lb 11.2 oz)     Intake/Output Summary (Last 24 hours) at 09/01/11 0928 Last data filed at 09/01/11 0917  Gross per 24 hour  Intake 819.33 ml  Output   1750 ml  Net -930.67 ml    Exam Awake Alert, Oriented *3, No new F.N deficits, Normal affect [place].AT,PERRAL Supple Neck,No JVD, No cervical lymphadenopathy appriciated.  Symmetrical Chest wall movement, Good air movement bilaterally, CTAB RRR,No Gallops,Rubs or new Murmurs, No Parasternal Heave +ve B.Sounds, Abd Soft, Non tender, No organomegaly appriciated, No rebound -guarding or rigidity. No Cyanosis, Clubbing or edema, No new Rash or bruise    Data Review  CBC  Lab 09/01/11 0455 08/30/11 0550 08/29/11 1304  WBC 6.2 9.7 13.9*  HGB 10.5* 12.7 14.5  HCT 32.5* 38.5 43.8  PLT 152 225 252  MCV 77.6* 77.2* 79.1  MCH 25.1* 25.5* 26.2  MCHC 32.3 33.0 33.1  RDW 14.3 15.3 15.0  LYMPHSABS -- -- 1.0  MONOABS -- -- 0.3  EOSABS -- -- 0.0  BASOSABS  -- -- 0.0  BANDABS -- -- --    Chemistries   Lab 09/01/11 0455 08/31/11 1010 08/31/11 0528 08/30/11 0550 08/30/11 0231 08/30/11 0015 08/29/11 1600 08/29/11 1551 08/29/11 1304  NA 141 -- 141 145 -- -- -- 137 133*  K 2.7* -- 3.2* 3.9 4.0 4.2 -- -- --  CL 100 -- 101 110 -- -- -- 108 102  CO2 31 -- 27 16* -- -- -- 9* 7*  GLUCOSE 136* -- 142* 157* -- -- -- 216* 159*  BUN 49* -- 79* 118* -- -- -- 143* 146*  CREATININE 1.43* -- 2.00* 3.97* -- -- -- 5.34* 5.87*  CALCIUM 8.2* -- 8.2* 9.5 -- -- -- 9.3 9.7  MG 1.2* -- -- -- -- -- -- -- --  AST 42* 44* -- -- -- -- 70* -- --  ALT 37* 41* -- -- -- --  53* -- --  ALKPHOS 96 106 -- -- -- -- 134* -- --  BILITOT 0.7 0.7 -- -- -- -- 0.3 -- --   ------------------------------------------------------------------------------------------------------------------ estimated creatinine clearance is 37.9 ml/min (by C-G formula based on Cr of 1.43). ------------------------------------------------------------------------------------------------------------------  Basename 08/29/11 1304  HGBA1C 7.0*   ------------------------------------------------------------------------------------------------------------------  Basename 08/31/11 1010  CHOL 116  HDL 21*  LDLCALC 35  TRIG 301*  CHOLHDL 5.5  LDLDIRECT --   ------------------------------------------------------------------------------------------------------------------  Basename 08/29/11 1304  TSH 0.377  T4TOTAL --  T3FREE --  THYROIDAB --   ------------------------------------------------------------------------------------------------------------------ No results found for this basename: VITAMINB12:2,FOLATE:2,FERRITIN:2,TIBC:2,IRON:2,RETICCTPCT:2 in the last 72 hours  Coagulation profile  Lab 08/30/11 0550  INR 1.15  PROTIME --    Lab Results  Component Value Date   LIPASE 75* 09/01/2011    No results found for this basename: DDIMER:2 in the last 72 hours  Cardiac Enzymes No  results found for this basename: CK:3,CKMB:3,TROPONINI:3,MYOGLOBIN:3 in the last 168 hours ------------------------------------------------------------------------------------------------------------------ No components found with this basename: POCBNP:3  Micro Results Recent Results (from the past 240 hour(s))  STOOL CULTURE     Status: Normal (Preliminary result)   Collection Time   08/30/11  6:39 AM      Component Value Range Status Comment   Specimen Description STOOL   Final    Special Requests Normal   Final    Culture NO SUSPICIOUS COLONIES, CONTINUING TO HOLD   Final    Report Status PENDING   Incomplete     Radiology Reports US Renal  08/29/2011  *RADIOLOGY REPORT*  Clinical Data: Acute renal failure  RENAL / URINARY TRACT ULTRASOUND  Technique:  Complete ultrasound exam of the kidneys and urinary bladder was performed.  Comparison:  No comparison studies available.  Findings:  The right kidney measures 12.2 cm in long axis.  The left kidney measures 12.5 cm.  There is no evidence for hydronephrosis in either kidney.  15 mm probable cyst noted in the upper pole of the right kidney.  Midline imaging through the pelvis reveals a decompressed urinary bladder secondary to Foley catheter placement.  Impression:  No evidence for hydronephrosis.  Original Report Authenticated By: ERIC A. MANSELL, M.D.    Scheduled Meds:    . atenolol  100 mg Oral BID  . cloNIDine  0.2 mg Oral TID  . fenofibrate  54 mg Oral Daily  . heparin  5,000 Units Subcutaneous Q8H  . insulin aspart  0-9 Units Subcutaneous TID WC  . magnesium sulfate 1 - 4 g bolus IVPB  2 g Intravenous Once  . ondansetron  4 mg Intravenous Once  . ondansetron  4 mg Intravenous Q6H  . ondansetron  4 mg Oral Q12H   Or  . ondansetron (ZOFRAN) IV  4 mg Intravenous Q12H  . pantoprazole  40 mg Oral Q0600  . potassium chloride  10 mEq Intravenous Q1 Hr x 4  . potassium chloride  40 mEq Oral Once  . sodium chloride  3 mL Intravenous  Q12H  . vitamin B-12  100 mcg Oral Daily  . DISCONTD: ondansetron (ZOFRAN) IV  4 mg Intravenous Once   Continuous Infusions:    . sodium chloride    . DISCONTD: 0.9 % NaCl with KCl 20 mEq / L 50 mL/hr at 08/31/11 2300   PRN Meds:.albuterol, guaiFENesin-dextromethorphan, HYDROcodone-acetaminophen, DISCONTD: ondansetron (ZOFRAN) IV, DISCONTD: ondansetron  Assessment & Plan   1. Acute renal failure and hyperkalemia in a patient with chronic kidney disease stage III baseline creatinine  1.7 (hypertension, diabetes, gout, nephrotoxic medications). Likely worsened by nausea vomiting diarrhea and dehydration, her nephrotoxic medications i.e. NSAIDs, ACE inhibitor, diuretics, Urolic will all be held. Status post  IV bicarbonate drip with good results, potassium and creatinine have improved(now Low K) , renal ultrasound stable, UA noted, CK was elevated likely due to being on urolic and statin, for now we'll continue IV fluids, serially monitor BMP on a daily basis, creat close to baseline, gentle IVF.   2. Metabolic acidosis with anion gap - likely due to renal insufficiency and uremia-leukocytosis normal, Bicarb stable, stop bicarbonate drip repeat BMP in the morning.     3. Hypertension- pressure stable and slightly on the higher side we will Put her back on her home medications at full dose, upon admission the dosages for halfed.    4. Diabetes mellitus type 2- oral medications will be held q. a.c. at bedtime sliding scale.   Lab Results  Component Value Date   HGBA1C 7.0* 08/29/2011    CBG (last 3)   Basename 09/01/11 0739 08/31/11 2123 08/31/11 1718  GLUCAP 118* 129* 170*      5. History of gout no acute issues for now Uloric & NSAIDs will be held.      6. Nausea vomiting diarrhea - no blood or mucus in emesis or stool, no antibiotic a sick exposure, is not complaining of abdominal pain but nauseated again today, stat lipase was checked which was 169 i.e. almost 3 times normal  value, raising question for pancreatitis, stable right upper quadrant ultrasound and mildly elevated TGs - started on Tricor, advance diet as tolerated per GI, stable KUB, pt is pain free.    7. 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    8.Elevated TGs- tricor.    9.Low K and Mag - replace and recheck.    DVT Prophylaxis   Heparin    Procedures renal ultrasound  Consults nephrology   Thurnell Lose M.D on 09/01/2011 at 9:28 AM  Between 7am to 7pm - Pager - [phone]  After 7pm go to [email or web address] - password [email or web address]  And look for the night coverage person covering for me after hours  [facility] Office  [phone]

## 2011-09-02 DIAGNOSIS — E119 Type 2 diabetes mellitus without complications: Secondary | ICD-10-CM

## 2011-09-02 DIAGNOSIS — N179 Acute kidney failure, unspecified: Secondary | ICD-10-CM

## 2011-09-02 DIAGNOSIS — I10 Essential (primary) hypertension: Secondary | ICD-10-CM

## 2011-09-02 DIAGNOSIS — E875 Hyperkalemia: Secondary | ICD-10-CM

## 2011-09-02 LAB — CK: Total CK: 260 U/L — ABNORMAL HIGH (ref 7–177)

## 2011-09-02 LAB — GLUCOSE, CAPILLARY

## 2011-09-02 LAB — COMPREHENSIVE METABOLIC PANEL
AST: 48 U/L — ABNORMAL HIGH (ref 0–37)
Albumin: 3.1 g/dL — ABNORMAL LOW (ref 3.5–5.2)
Alkaline Phosphatase: 110 U/L (ref 39–117)
BUN: 28 mg/dL — ABNORMAL HIGH (ref 6–23)
Chloride: 102 mEq/L (ref 96–112)
Potassium: 3.6 mEq/L (ref 3.5–5.1)
Sodium: 140 mEq/L (ref 135–145)
Total Protein: 6.9 g/dL (ref 6.0–8.3)

## 2011-09-02 LAB — STOOL CULTURE: Special Requests: NORMAL

## 2011-09-02 MED ORDER — AMLODIPINE BESYLATE 5 MG PO TABS
5.0000 mg | ORAL_TABLET | Freq: Every day | ORAL | Status: DC
  Administered 2011-09-02: 5 mg via ORAL
  Filled 2011-09-02: qty 1

## 2011-09-02 MED ORDER — ONDANSETRON HCL 4 MG PO TABS: 4.0000 mg | ORAL_TABLET | Freq: Three times a day (TID) | ORAL | Status: AC | PRN

## 2011-09-02 MED ORDER — AMLODIPINE BESYLATE 5 MG PO TABS: 5.0000 mg | ORAL_TABLET | Freq: Every day | ORAL | Status: DC

## 2011-09-02 MED ORDER — POTASSIUM CHLORIDE CRYS ER 20 MEQ PO TBCR
20.0000 meq | EXTENDED_RELEASE_TABLET | Freq: Once | ORAL | Status: AC
  Administered 2011-09-02: 20 meq via ORAL
  Filled 2011-09-02 (×2): qty 1

## 2011-09-02 MED ORDER — FENOFIBRATE 54 MG PO TABS: 54.0000 mg | ORAL_TABLET | Freq: Every day | ORAL | Status: DC

## 2011-09-02 MED ORDER — MAGNESIUM SULFATE IN D5W 10-5 MG/ML-% IV SOLN
1.0000 g | Freq: Once | INTRAVENOUS | Status: AC
Start: 2011-09-02 — End: 2011-09-02
  Administered 2011-09-02: 1 g via INTRAVENOUS
  Filled 2011-09-02: qty 100

## 2011-09-02 MED ORDER — PANTOPRAZOLE SODIUM 40 MG PO TBEC: 40.0000 mg | DELAYED_RELEASE_TABLET | Freq: Every day | ORAL | Status: DC

## 2011-09-02 NOTE — Discharge Instructions (Signed)
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  Get CBC, CMP checked and follow final stool culture results  In 3 days by Primary MD and again as instructed by your Primary MD.    Get Medicines reviewed and adjusted.  Accuchecks 4 times/day, Once in AM empty stomach and then before each meal. Log in all results and show them to your Prim.MD in 3 days. If any glucose reading is under 80 or above 300 call your Prim MD immidiately. Follow Low glucose instructions for glucose under 80 as instructed.   Please request your Prim.MD to go over all Hospital Tests and Procedure/Radiological results at the follow up, please get all Hospital records sent to your Prim MD by signing hospital release before you go home.  Activity: As tolerated with Full fall precautions use walker/cane & assistance as needed  Diet: Heart Healthy & Low Carb, Aspiration precautions.  Check your Weight same time everyday, if you gain over 2 pounds, or you develop in leg swelling, experience more shortness of breath or chest pain, call your Primary MD immediately. Follow Cardiac Low Salt Diet and 1.8 lit/day fluid restriction.  Disposition Home    If you experience worsening of your admission symptoms, develop shortness of breath, life threatening emergency, suicidal or homicidal thoughts you must seek medical attention immediately by calling 911 or calling your MD immediately  if symptoms less severe.  You Must read complete instructions/literature along with all the possible adverse reactions/side effects for all the Medicines you take and that have been prescribed to you. Take any new Medicines after you have completely understood and accpet all the possible adverse reactions/side effects.   Do not drive if your were admitted for syncope or siezures until you have seen by Primary MD or a Neurologist and  advised to drive.  Do not drive when taking Pain medications.    Do not take more than prescribed Pain, Sleep and Anxiety Medications  Special Instructions: If you have smoked or chewed Tobacco  in the last 2 yrs please stop smoking, stop any regular Alcohol  and or any Recreational drug use.  Wear Seat belts while driving.

## 2011-09-02 NOTE — Discharge Summary (Signed)
Jacqueline Richmond, is a 66 y.o. female  DOB 10/05/1945  MRN [id number].  Admission date:  08/29/2011  Discharge Date:  09/02/2011  Primary MD  No primary provider on file.  Admitting Physician  Thurnell Lose, MD  Admission Diagnosis  Dehydration [276.51] Hyperkalemia [276.7] Gastroenteritis [558.9] Acute renal failure [584.9] nausea/ vomitting  Discharge Diagnosis     Active Problems:  Type II or unspecified type diabetes mellitus without mention of complication, not stated as uncontrolled  CKD (chronic kidney disease) stage 3, GFR 30-59 ml/min  ARF (acute renal failure)  Hyperkalemia  HTN (hypertension)  Gout    Past Medical History  Diagnosis Date  . Hypertension   . Type II or unspecified type diabetes mellitus without mention of complication, not stated as uncontrolled 08/29/2011  . HTN (hypertension) 08/29/2011  . CKD (chronic kidney disease) stage 3, GFR 30-59 ml/min 08/29/2011  . Complication of anesthesia     Past Surgical History  Procedure Date  . Cholecystectomy      Hospital Course See H&P, Labs, Consult and Test reports for all details in brief, patient was admitted for  nausea vomiting diarrhea, causing severe dehydration acute renal insufficiency along with metabolic acidosis on the patient with possible chronic kidney disease stage II to 3, this was likely caused by recent use of colchicine, she was also on Indocin, ACE inhibitor, HCTZ, Uloric which contributed to her acute renal insufficiency, she also had mild rhabdo which could have been caused by uric along with statin. She was treated with IV fluids for hydration IV bicarbonate drip initially, gradually her renal function improved, she also had a potassium of 7.5 on admission, all this has stabilized after her medications have been adjusted.  Obviously no more colchicine, will hold ACE  inhibitor, HCTZ, Uloric, statin, NSAID. 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    Hypertension- able on present medications gradually titrate medications as needed .    Diabetes mellitus type 2- continue glipizide and monitor.   Lab Results   Component  Value  Date    HGBA1C  7.0*  08/29/2011     CBG (last 3)   Basename 09/01/11 1958 09/01/11 1706 09/01/11 1240  GLUCAP 154* 119* 143*       History of gout no acute issues for now Uloric, colchicine & NSAIDs will be held.     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     Elevated TGs- tricor. Tinea to monitor lipid panel outpatient. Note statin was discontinued due to evidence of rhabdo upon admission.     Low K and Mag - replaced and stable       Consults  GI  Significant Tests:  See full reports for all details    US Abdomen Complete  08/31/2011  *RADIOLOGY REPORT*  Clinical Data:  Acute renal failure, pancreatitis  COMPLETE ABDOMINAL ULTRASOUND  Comparison:  Renal ultrasound 08/29/2011  Findings:  Gallbladder:  Surgically absent  Common bile duct:  7.8 mm in diameter  Liver:  No focal lesion identified. Mild increased echogenicity of the liver suspicious for fatty infiltration.  IVC:  Appears normal.  Pancreas:  Not visualized due to abundant bowel gas.  Spleen:  5.4 cm in length.  Normal echogenicity  Right Kidney: Measures 11.5 cm in length.  No hydronephrosis or diagnostic renal calculus.  There is a cyst in upper pole measures 1.3 x 1.6 cm.  Left Kidney:  Measures 11.3 cm in length.  No hydronephrosis. Probable nonobstructive cap calcified calculus mid pole measures 6 mm.  Probable nonobstructive calcified calculus lower pole measures 3.5 mm.  Abdominal aorta:  No aneurysm identified.  IMPRESSION:  1.  Surgically absent gallbladder.  Normal CBD.  2.  Question fatty infiltration of the liver. 3.  No hydronephrosis.  There is a cyst in upper pole of the right kidney measures 1.6 cm.  Probable left nonobstructive nephrolithiasis.  Original Report Authenticated By: Lahoma Crocker, M.D.   US Renal  08/29/2011  *RADIOLOGY REPORT*  Clinical Data: Acute renal failure  RENAL / URINARY TRACT ULTRASOUND  Technique:  Complete ultrasound exam of the kidneys and urinary bladder was performed.  Comparison:  No comparison studies available.  Findings:  The right kidney measures 12.2 cm in long axis.  The left kidney measures 12.5 cm.  There is no evidence for hydronephrosis in either kidney.  15 mm probable cyst noted in the upper pole of the right kidney.  Midline imaging through the pelvis reveals a decompressed urinary bladder secondary to Foley catheter placement.  Impression:  No evidence for hydronephrosis.  Original Report Authenticated By: ERIC A. MANSELL, M.D.   Dg Abd Acute W/chest  08/31/2011  *RADIOLOGY REPORT*  Clinical Data: Nausea  ACUTE ABDOMEN SERIES (ABDOMEN 2 VIEW & CHEST 1 VIEW)  Comparison: None  Findings: Cardiomediastinal silhouette is unremarkable.  No acute infiltrate or pleural effusion.  No pulmonary edema. Degenerative changes thoracolumbar spine.  Post cholecystectomy surgical clips are noted.  A right hip prosthesis is noted in anatomic alignment. Nonobstructive small bowel gas pattern.  No free abdominal air. There is a gas filled structure in mid lower abdomen measures 15 x 12 cm this may represent focal gaseous sigmoid colon distention. Colonic ileus cannot be excluded.  Clinical correlation and follow- up examination is recommended.  IMPRESSION: No acute disease within chest. Nonobstructive small bowel gas pattern.  No free abdominal air.  There is a gas filled structure in mid lower abdomen measures 15 x 12 cm this may represent focal gaseous sigmoid colon distention.  Colonic ileus cannot be excluded.  Clinical correlation and follow-up  examination is recommended.  Original Report Authenticated By: Lahoma Crocker, M.D.     Today   Subjective:   [name] today has no headache,no chest abdominal pain,no new weakness tingling or numbness, feels much better wants to go home today.   Objective:   Blood pressure 170/83, pulse 62, temperature 97.7 F (36.5 C), temperature source Oral, resp. rate 18, height 5\' 2"  (1.575 m), weight 78.382 kg (172 lb 12.8 oz), SpO2 96.00%.  Intake/Output Summary (Last 24 hours) at 09/02/11 0742 Last data filed at 09/02/11 0441  Gross per 24 hour  Intake 1358.75 ml  Output   1625 ml  Net -266.25 ml    Exam Awake Alert, Oriented *3, No new F.N deficits, Normal affect [place].AT,PERRAL Supple Neck,No JVD,  No cervical lymphadenopathy appriciated.  Symmetrical Chest wall movement, Good air movement bilaterally, CTAB RRR,No Gallops,Rubs or new Murmurs, No Parasternal Heave +ve B.Sounds, Abd Soft, Non tender, No organomegaly appriciated, No rebound -guarding or rigidity. No Cyanosis, Clubbing or edema, No new Rash or bruise  Data Review    Recent Results (from the past 240 hour(s))  STOOL CULTURE     Status: Normal (Preliminary result)   Collection Time   08/30/11  6:39 AM      Component Value Range Status Comment   Specimen Description STOOL   Final    Special Requests Normal   Final    Culture NO SUSPICIOUS COLONIES, CONTINUING TO HOLD   Final    Report Status PENDING   Incomplete      CBC w Diff: Lab Results  Component Value Date   WBC 6.2 09/01/2011   HGB 10.5* 09/01/2011   HCT 32.5* 09/01/2011   PLT 152 09/01/2011   LYMPHOPCT 7* 08/29/2011   MONOPCT 2* 08/29/2011   EOSPCT 0 08/29/2011   BASOPCT 0 08/29/2011    CMP: Lab Results  Component Value Date   NA 140 09/02/2011   K 3.6 09/02/2011   CL 102 09/02/2011   CO2 30 09/02/2011   BUN 28* 09/02/2011   CREATININE 1.28* 09/02/2011   PROT 6.9 09/02/2011   ALBUMIN 3.1* 09/02/2011   BILITOT 0.7 09/02/2011   ALKPHOS 110 09/02/2011   AST 48*  09/02/2011   ALT 49* 09/02/2011  .   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    Get CBC, CMP checked and follow final stool culture results  In 3 days by Primary MD and again as instructed by your Primary MD.    Get Medicines reviewed and adjusted.  Accuchecks 4 times/day, Once in AM empty stomach and then before each meal. Log in all results and show them to your Prim.MD in 3 days. If any glucose reading is under 80 or above 300 call your Prim MD immidiately. Follow Low glucose instructions for glucose under 80 as instructed.   Please request your Prim.MD to go over all Hospital Tests and Procedure/Radiological results at the follow up, please get all Hospital records sent to your Prim MD by signing hospital release before you go home.  Activity: As tolerated with Full fall precautions use walker/cane & assistance as needed  Diet: Heart Healthy & Low Carb, Aspiration precautions.  Check your Weight same time everyday, if you gain over 2 pounds, or you develop in leg swelling, experience more shortness of breath or chest pain, call your Primary MD immediately. Follow Cardiac Low Salt Diet and 1.8 lit/day fluid restriction.  Disposition Home    If you experience worsening of your admission symptoms, develop shortness of breath, life threatening emergency, suicidal or homicidal thoughts you must seek medical attention immediately by calling 911 or calling your MD immediately  if symptoms less severe.  You Must read complete instructions/literature along with all the possible adverse reactions/side effects for all the Medicines you take and that have been prescribed to you. Take any new Medicines after you have completely understood and accpet all the possible adverse reactions/side effects.   Do not drive if your were admitted for  syncope or siezures until you have seen by Primary MD or a Neurologist and advised to  drive.  Do not drive when taking Pain medications.    Do not take more than prescribed Pain, Sleep and Anxiety Medications  Special Instructions: If you have smoked or chewed Tobacco  in the last 2 yrs please stop smoking, stop any regular Alcohol  and or any Recreational drug use.  Wear Seat belts while driving.  Follow-up Information    Follow up with BOUSKA,DAVID E, MD. Schedule an appointment as soon as possible for a visit in 2 days.   Contact information:   [address] [place] [phone]       Follow up with NESI,MARC-HENRY, MD. Schedule an appointment as soon as possible for a visit in 1 week.   Contact information:   [address] [facility] [place]       Follow up with Windy Kalata, MD. Schedule an appointment as soon as possible for a visit in 2 weeks.   Contact information:   [address] [place] [phone]          Discharge Medications    [place], [place]  Home Medication Instructions [id number]   Printed on:09/02/11 0750  Medication Information                    niacin 500 MG tablet Take 500 mg by mouth 2 (two) times daily with a meal.           atenolol (TENORMIN) 100 MG tablet Take 100 mg by mouth daily.           glipiZIDE (GLUCOTROL XL) 5 MG 24 hr tablet Take 5 mg by mouth daily.           cloNIDine (CATAPRES) 0.3 MG tablet Take 0.3 mg by mouth 2 (two) times daily.           aspirin EC 81 MG tablet Take 81 mg by mouth daily.           Garlic Oil (GARLIC [id number]) [id number] MG CAPS Take 2 capsules by mouth daily.           vitamin B-12 (CYANOCOBALAMIN) 100 MCG tablet Take 100 mcg by mouth daily.           CINNAMON PO Take 1 capsule by mouth daily.           fish oil-omega-3 fatty  acids 1000 MG capsule Take 1 g by mouth daily.           pantoprazole (PROTONIX) 40 MG tablet Take 1 tablet (40 mg total) by mouth daily at 6 (six) AM.           ondansetron (ZOFRAN) 4 MG tablet Take 1 tablet (4 mg total) by mouth every 8 (eight) hours as needed for nausea.           fenofibrate 54 MG tablet Take 1 tablet (54 mg total) by mouth at bedtime.           amLODipine (NORVASC) 5 MG tablet Take 1 tablet (5 mg total) by mouth daily.            Total Time in preparing paper work, data evaluation and todays exam - 35 minutes  Thurnell Lose M.D on 09/02/2011 at 7:42 AM  [place]  [phone]

## 2011-09-02 NOTE — Progress Notes (Signed)
Notified NP on call of patient's am blood pressure via text.  No return call or new orders.  Will continue to monitor patient.  [place] Shark, Chana Lindstrom Cherie

## 2012-04-14 ENCOUNTER — Other Ambulatory Visit: Payer: Self-pay | Admitting: Radiology

## 2012-04-22 ENCOUNTER — Ambulatory Visit (INDEPENDENT_AMBULATORY_CARE_PROVIDER_SITE_OTHER): Payer: Self-pay | Admitting: General Surgery

## 2012-04-26 ENCOUNTER — Ambulatory Visit (INDEPENDENT_AMBULATORY_CARE_PROVIDER_SITE_OTHER): Payer: BC Managed Care – PPO | Admitting: General Surgery

## 2012-04-26 ENCOUNTER — Encounter (INDEPENDENT_AMBULATORY_CARE_PROVIDER_SITE_OTHER): Payer: Self-pay | Admitting: General Surgery

## 2012-04-26 VITALS — BP 127/82 | HR 68 | Temp 97.6°F | Resp 20 | Ht 64.5 in | Wt 188.0 lb

## 2012-04-26 DIAGNOSIS — D249 Benign neoplasm of unspecified breast: Secondary | ICD-10-CM | POA: Insufficient documentation

## 2012-04-26 NOTE — Progress Notes (Signed)
Subjective:     Patient ID: Jacqueline Richmond, female   DOB: 01/17/1946, 67 y.o.   MRN: [id number]  HPI We're asked to see the patient in consultation by Dr. Coletta Memos to evaluate her for a right breast papilloma. The patient is a 67 year old white female who recently went for a routine screening mammogram. At that time she was found to have some abnormal calcifications in the lateral part of the right breast. These were biopsied and came back as a papilloma. She denies any breast pain. She denies any discharge from the nipple. She has no personal or family history of breast problems.  Review of Systems  Constitutional: Negative.   HENT: Negative.   Eyes: Negative.   Respiratory: Negative.   Cardiovascular: Negative.   Gastrointestinal: Negative.   Genitourinary: Negative.   Musculoskeletal: Positive for arthralgias.  Skin: Negative.   Neurological: Negative.   Hematological: Negative.   Psychiatric/Behavioral: Negative.        Objective:   Physical Exam  Constitutional: She is oriented to person, place, and time. She appears well-developed and well-nourished.  HENT:  Head: Normocephalic and atraumatic.  Eyes: Conjunctivae normal and EOM are normal. Pupils are equal, round, and reactive to light.  Neck: Normal range of motion. Neck supple.  Cardiovascular: Normal rate, regular rhythm and normal heart sounds.   Pulmonary/Chest: Effort normal and breath sounds normal.       There is no palpable mass in either breast. There is no palpable axillary or supraclavicular cervical lymphadenopathy  Abdominal: Soft. Bowel sounds are normal. She exhibits no mass. There is no tenderness.  Musculoskeletal: Normal range of motion.  Lymphadenopathy:    She has no cervical adenopathy.  Neurological: She is alert and oriented to person, place, and time.  Skin: Skin is warm and dry.  Psychiatric: She has a normal mood and affect. Her behavior is normal.       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 Because this lesion increases her breast cancer risk I think he would be reasonable to remove it. She would also like to have this done. I've discussed with her in detail the risks and benefits of the operation to remove this area as well as some of the technical aspects and she understands and wishes to proceed.    Plan:     Plan for right breast wire localized lumpectomy

## 2012-04-26 NOTE — Patient Instructions (Signed)
Plan for right breast wire localized lumpectomy

## 2012-04-29 ENCOUNTER — Encounter (INDEPENDENT_AMBULATORY_CARE_PROVIDER_SITE_OTHER): Payer: Self-pay

## 2012-05-03 ENCOUNTER — Encounter (HOSPITAL_COMMUNITY): Payer: Self-pay

## 2012-05-03 ENCOUNTER — Encounter (HOSPITAL_COMMUNITY)
Admission: RE | Admit: 2012-05-03 | Discharge: 2012-05-03 | Disposition: A | Discharge status: Home / Self Care | Payer: BC Managed Care – PPO | Source: Ambulatory Visit | Attending: General Surgery | Admitting: General Surgery

## 2012-05-03 HISTORY — DX: Psoriasis, unspecified: L40.9

## 2012-05-03 HISTORY — DX: Hyperlipidemia, unspecified: E78.5

## 2012-05-03 HISTORY — DX: Gout, unspecified: M10.9

## 2012-05-03 LAB — CBC
MCH: 27.5 pg (ref 26.0–34.0)
Platelets: 254 10*3/uL (ref 150–400)
RBC: 4.77 MIL/uL (ref 3.87–5.11)

## 2012-05-03 LAB — BASIC METABOLIC PANEL
Calcium: 10.2 mg/dL (ref 8.4–10.5)
GFR calc Af Amer: 31 mL/min — ABNORMAL LOW (ref >90)
GFR calc non Af Amer: 27 mL/min — ABNORMAL LOW (ref >90)
Glucose, Bld: 93 mg/dL (ref 70–99)
Potassium: 4.3 mEq/L (ref 3.5–5.1)
Sodium: 142 mEq/L (ref 135–145)

## 2012-05-03 LAB — SURGICAL PCR SCREEN
MRSA, PCR: NEGATIVE
Staphylococcus aureus: NEGATIVE

## 2012-05-03 IMAGING — CR DG CHEST 2V
2 series · 2 of 2 positions shown · non-contrast
Comparison: None.

CLINICAL DATA: Right breast surgery.

CHEST - 2 VIEW

[view not recorded (1 of 2)]
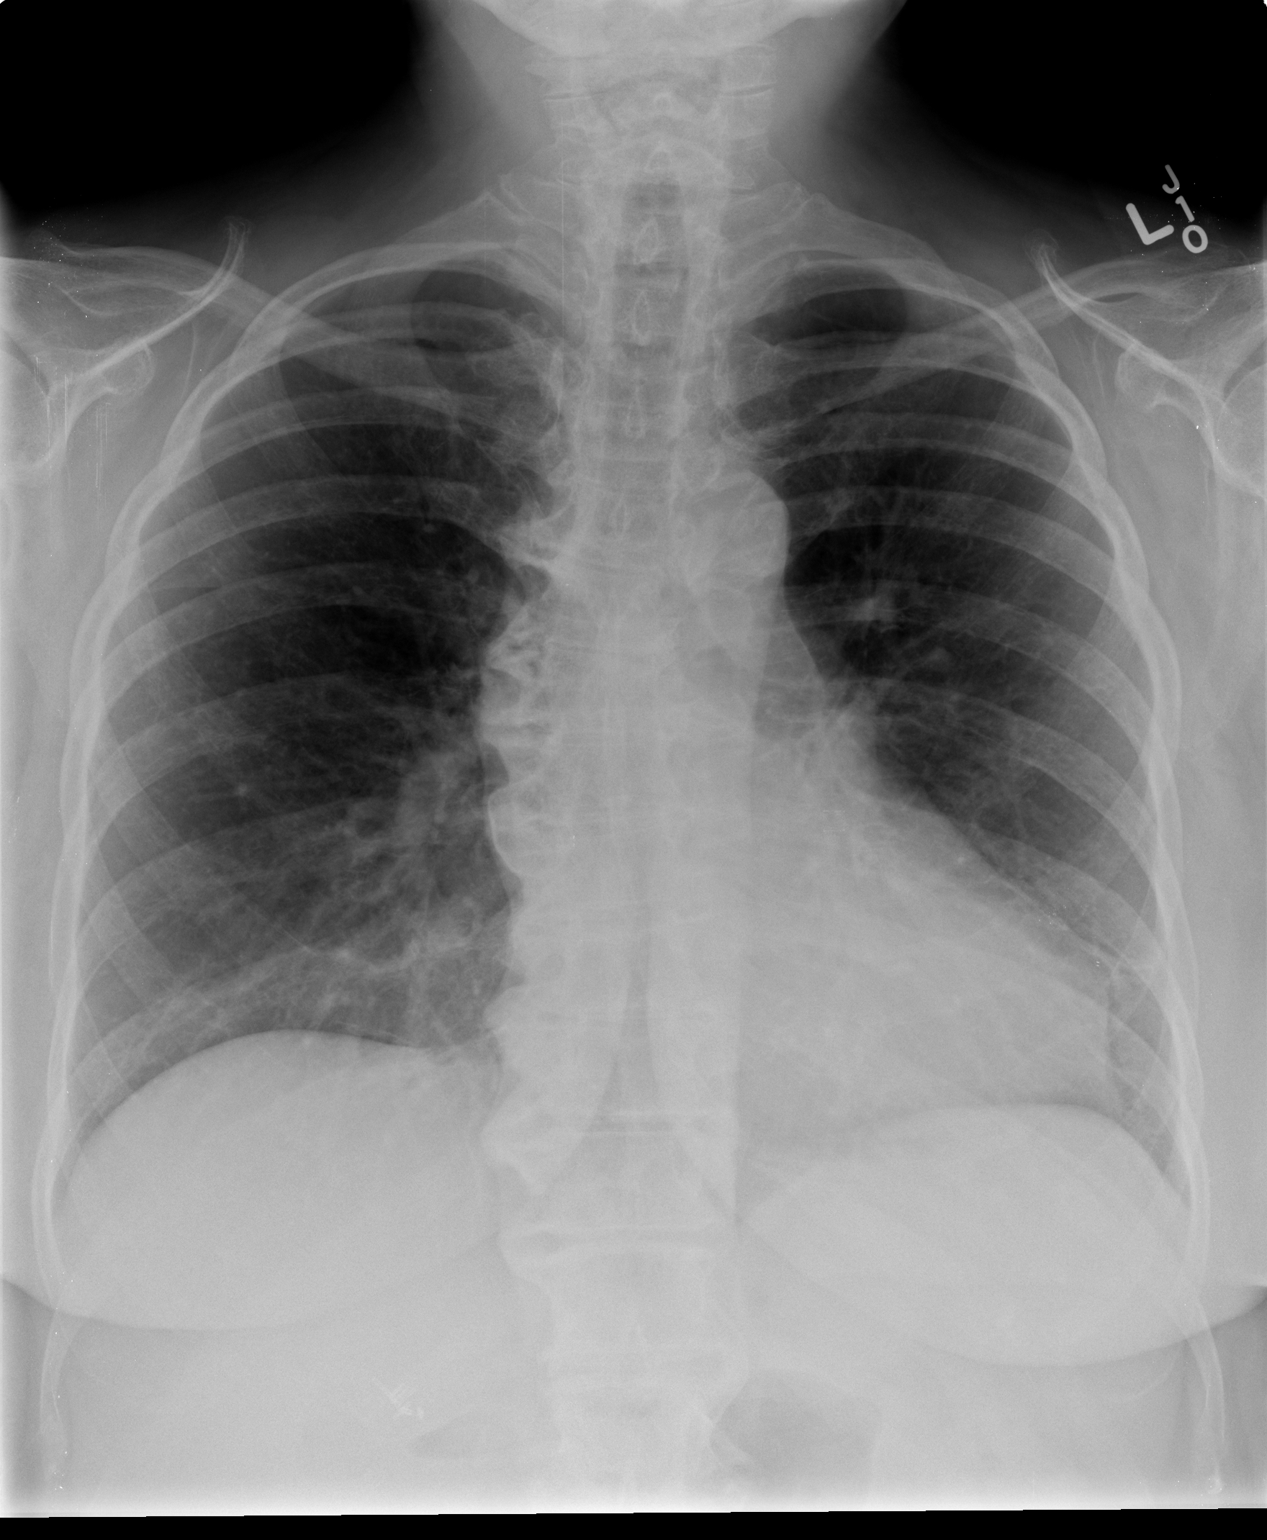

[view not recorded (2 of 2)]
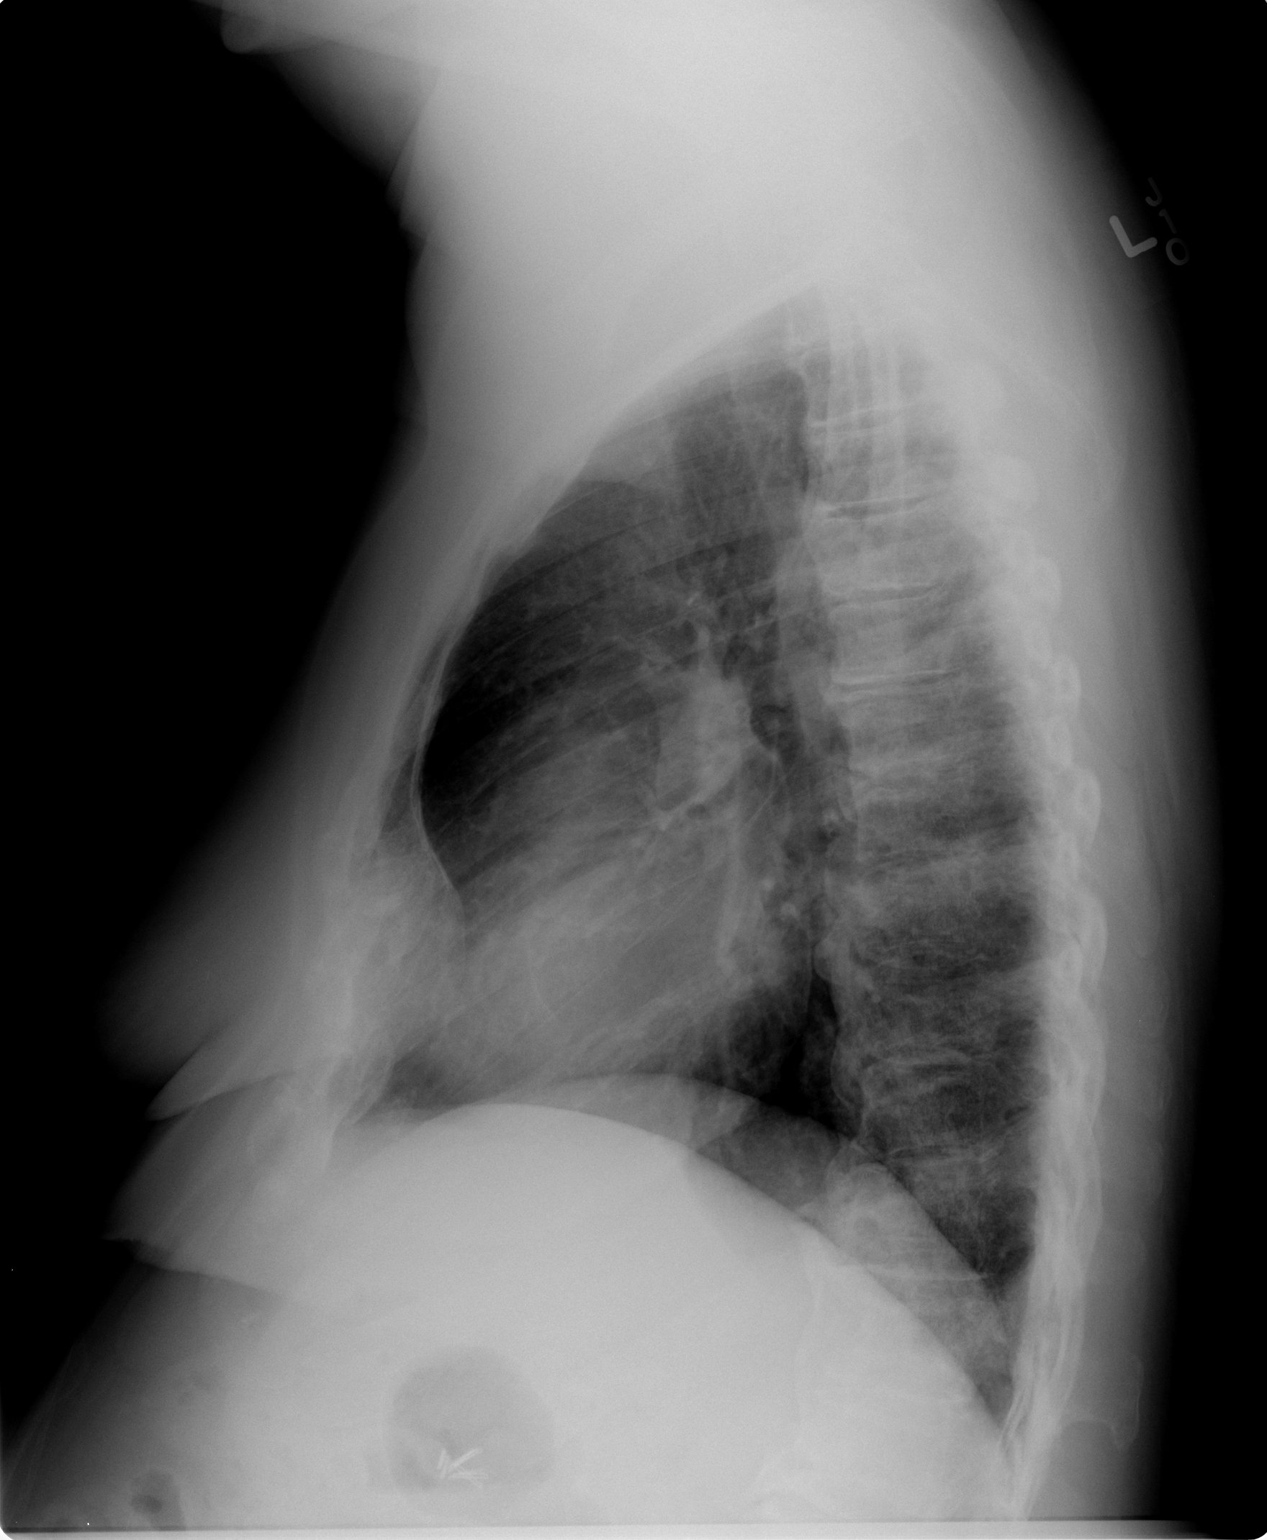

[2 of 2 positions shown; findings below may reference images not displayed]

FINDINGS: Trachea is midline.  Heart size normal.  Probable linear
scarring in the lingula.  Added density in the medial lower right
hemithorax may be due to a pectus deformity.  Lungs are otherwise
clear.  No pleural fluid.  Degenerative changes are seen in the
spine.
IMPRESSION: No acute findings.

## 2012-05-03 NOTE — Progress Notes (Signed)
Patient denied having a stress test, cardiac cath, or sleep study. Patient did however inform Nurse that she had a complication with anesthesia in May 2004 at [facility], but was unsure of what happened. Ebony Hail, [facility] called for assistance on finding this complication. PCP is Dr. Bernerd Limbo.

## 2012-05-03 NOTE — Pre-Procedure Instructions (Signed)
Jacqueline Richmond  05/03/2012   Your procedure is scheduled on:  Friday May 07, 2012.  Report to [facility] at 7:30 AM.  Call this number if you have problems the morning of surgery: [phone]   Remember:   Do not eat food or drink liquids after midnight.   Take these medicines the morning of surgery with A SIP OF WATER: Amlodipine (Norvasc), Atenolol (Tenormin), Clonidine (Catapres)  Discontinue aspirin, Coumadin, Plavix, Effient, and herbal medications 5 days prior to surgery   Do not wear jewelry, make-up or nail polish.  Do not wear lotions, powders, or perfumes.   Do not shave 48 hours prior to surgery.   Do not bring valuables to the hospital.  Contacts, dentures or bridgework may not be worn into surgery.  Leave suitcase in the car. After surgery it may be brought to your room.  For patients admitted to the hospital, checkout time is 11:00 AM the day of discharge.   Patients discharged the day of surgery will not be allowed to drive home.  Name and phone number of your driver:   Special Instructions: Shower using CHG 2 nights before surgery and the night before surgery.  If you shower the day of surgery use CHG.  Use special wash - you have one bottle of CHG for all showers.  You should use approximately 1/3 of the bottle for each shower.   Please read over the following fact sheets that you were given: Pain Booklet, Coughing and Deep Breathing, MRSA Information and Surgical Site Infection Prevention

## 2012-05-03 NOTE — Consult Note (Signed)
Anesthesia Consult:  Patient is a 67 year old female scheduled for right breast lumpectomy for right breast papilloma on 05/07/12 by Dr. Marlou Starks.  History includes non-smoker, HTN, DM2, CKD stage III, gout, psoriasis, HLD, rhinoplasty, cholecystectomy and incisional hernia repair 10/28/02, right THA 07/25/02.  She was hospitalized in June 2013 for acute on chronic renal failure due to dehydration secondary to gastroenteritis.  Renal notes from Dr. Jonnie Finner during that admission note that her baseline Cr is ~ 1.7-1.8.  PCP is Dr. Bernerd Limbo [phone]).  Anesthesia History:  In 2004 following her THA, she remembers a nurse or CRNA coming into her room and telling her she had some sort of an anesthesia "complication" and should always notify her anesthesia team prior to surgery.  Patient felt that individual was a little abrasive, and patient actually felt criticized for something she knew nothing about and still was unclear of what the "complication" was after the individual left her room.  She later had hernia surgery without incident.   She denies known history of malignant hyperthermia.  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  I was able to obtain anesthesia records from both of her surgeries in 2004 from [facility].  In both incidences she was able to undergo GA with an orally placed ETT.  Notes from 07/25/02 indicate: "IV induct -- mask vent oral airway unable to expose -- END # 7.0 OR Rose Bougie -- blind first attempt."  On 10/28/02 a # 7.0 ETT was placed using a stylet and 3 MAC blade with one attempt.  EKG on 08/29/11 showed NSR.  CXR on 05/03/12 showed no acute findings.  Preoperative labs showed a BUN/Cr of 48/1.90. As above, her baseline Cr is ~ 1.7-1.8.    I spoke with patient during her PAT visit.  I reviewed her previous anesthesia records with her indicating that she was felt to be a difficult airway during her procedure in 07/25/02 due to poor exposure that may have been related to her anatomy,  ie., small mouth opening.  Her tongue is also large/thick.  I discussed potential means of establishing an airway for her upcoming procedure.  She is aware that she will talk with her assigned anesthesiologist on the day of surgery to discuss the definitive anesthesia plan.  Myra Gianotti, PA-C 05/03/12 1620

## 2012-05-04 ENCOUNTER — Encounter (HOSPITAL_COMMUNITY): Payer: Self-pay | Admitting: Vascular Surgery

## 2012-05-06 MED ORDER — CHLORHEXIDINE GLUCONATE 4 % EX LIQD: 1.0000 "application " | Freq: Once | CUTANEOUS | Status: DC

## 2012-05-06 MED ORDER — CEFAZOLIN SODIUM-DEXTROSE 2-3 GM-% IV SOLR
2.0000 g | INTRAVENOUS | Status: AC
  Administered 2012-05-07: 2 g via INTRAVENOUS
  Filled 2012-05-06: qty 50

## 2012-05-07 ENCOUNTER — Ambulatory Visit (HOSPITAL_COMMUNITY): Payer: BC Managed Care – PPO | Admitting: Vascular Surgery

## 2012-05-07 ENCOUNTER — Encounter (HOSPITAL_COMMUNITY): Payer: Self-pay | Admitting: Vascular Surgery

## 2012-05-07 ENCOUNTER — Encounter (HOSPITAL_COMMUNITY): Payer: Self-pay | Admitting: Certified Registered"

## 2012-05-07 ENCOUNTER — Encounter (HOSPITAL_COMMUNITY)
Admission: RE | Disposition: A | Discharge status: Home / Self Care | Payer: Self-pay | Source: Ambulatory Visit | Attending: General Surgery

## 2012-05-07 ENCOUNTER — Ambulatory Visit (HOSPITAL_COMMUNITY)
Admission: RE | Admit: 2012-05-07 | Discharge: 2012-05-07 | Disposition: A | Discharge status: Home / Self Care | Payer: BC Managed Care – PPO | Source: Ambulatory Visit | Attending: General Surgery | Admitting: General Surgery

## 2012-05-07 DIAGNOSIS — N6089 Other benign mammary dysplasias of unspecified breast: Secondary | ICD-10-CM | POA: Insufficient documentation

## 2012-05-07 DIAGNOSIS — I10 Essential (primary) hypertension: Secondary | ICD-10-CM | POA: Insufficient documentation

## 2012-05-07 DIAGNOSIS — E119 Type 2 diabetes mellitus without complications: Secondary | ICD-10-CM | POA: Insufficient documentation

## 2012-05-07 DIAGNOSIS — D249 Benign neoplasm of unspecified breast: Secondary | ICD-10-CM

## 2012-05-07 DIAGNOSIS — Z01812 Encounter for preprocedural laboratory examination: Secondary | ICD-10-CM | POA: Insufficient documentation

## 2012-05-07 DIAGNOSIS — Z01818 Encounter for other preprocedural examination: Secondary | ICD-10-CM | POA: Insufficient documentation

## 2012-05-07 DIAGNOSIS — N289 Disorder of kidney and ureter, unspecified: Secondary | ICD-10-CM | POA: Insufficient documentation

## 2012-05-07 HISTORY — DX: Failed or difficult intubation, initial encounter: T88.4XXA

## 2012-05-07 HISTORY — PX: BREAST LUMPECTOMY WITH NEEDLE LOCALIZATION: SHX5759

## 2012-05-07 LAB — GLUCOSE, CAPILLARY: Glucose-Capillary: 131 mg/dL — ABNORMAL HIGH (ref 70–99)

## 2012-05-07 SURGERY — BREAST LUMPECTOMY WITH NEEDLE LOCALIZATION
Anesthesia: General | Site: Breast | Laterality: Right | Wound class: Clean

## 2012-05-07 MED ORDER — MEPERIDINE HCL 25 MG/ML IJ SOLN: 6.2500 mg | INTRAMUSCULAR | Status: DC | PRN

## 2012-05-07 MED ORDER — ATENOLOL 100 MG PO TABS
100.0000 mg | ORAL_TABLET | ORAL | Status: AC
  Administered 2012-05-07: 100 mg via ORAL
  Filled 2012-05-07: qty 1

## 2012-05-07 MED ORDER — BUPIVACAINE-EPINEPHRINE 0.25% -1:200000 IJ SOLN
INTRAMUSCULAR | Status: DC | PRN
  Administered 2012-05-07: 20 mL

## 2012-05-07 MED ORDER — OXYCODONE HCL 5 MG PO TABS
5.0000 mg | ORAL_TABLET | Freq: Once | ORAL | Status: AC | PRN
  Administered 2012-05-07: 5 mg via ORAL

## 2012-05-07 MED ORDER — FENTANYL CITRATE 0.05 MG/ML IJ SOLN
INTRAMUSCULAR | Status: DC | PRN
  Administered 2012-05-07: 25 ug via INTRAVENOUS
  Administered 2012-05-07: 75 ug via INTRAVENOUS

## 2012-05-07 MED ORDER — PROMETHAZINE HCL 25 MG/ML IJ SOLN: 6.2500 mg | INTRAMUSCULAR | Status: DC | PRN

## 2012-05-07 MED ORDER — OXYCODONE HCL 5 MG PO TABS
ORAL_TABLET | ORAL | Status: AC
  Administered 2012-05-07: 5 mg via ORAL
  Filled 2012-05-07: qty 1

## 2012-05-07 MED ORDER — LACTATED RINGERS IV SOLN
INTRAVENOUS | Status: DC | PRN
  Administered 2012-05-07: 10:00:00 via INTRAVENOUS

## 2012-05-07 MED ORDER — MIDAZOLAM HCL 5 MG/5ML IJ SOLN
INTRAMUSCULAR | Status: DC | PRN
  Administered 2012-05-07: 2 mg via INTRAVENOUS

## 2012-05-07 MED ORDER — TRAMADOL HCL 50 MG PO TABS: 50.0000 mg | ORAL_TABLET | Freq: Four times a day (QID) | ORAL | Status: DC | PRN

## 2012-05-07 MED ORDER — 0.9 % SODIUM CHLORIDE (POUR BTL) OPTIME
TOPICAL | Status: DC | PRN
  Administered 2012-05-07: 1000 mL

## 2012-05-07 MED ORDER — ONDANSETRON HCL 4 MG/2ML IJ SOLN
INTRAMUSCULAR | Status: DC | PRN
  Administered 2012-05-07: 4 mg via INTRAVENOUS

## 2012-05-07 MED ORDER — BUPIVACAINE-EPINEPHRINE 0.25% -1:200000 IJ SOLN
INTRAMUSCULAR | Status: AC
  Filled 2012-05-07: qty 1

## 2012-05-07 MED ORDER — HYDROMORPHONE HCL PF 1 MG/ML IJ SOLN: 0.2500 mg | INTRAMUSCULAR | Status: DC | PRN

## 2012-05-07 MED ORDER — LIDOCAINE HCL (CARDIAC) 20 MG/ML IV SOLN
INTRAVENOUS | Status: DC | PRN
  Administered 2012-05-07: 70 mg via INTRAVENOUS

## 2012-05-07 MED ORDER — OXYCODONE HCL 5 MG/5ML PO SOLN: 5.0000 mg | Freq: Once | ORAL | Status: AC | PRN

## 2012-05-07 SURGICAL SUPPLY — 48 items
ADH SKN CLS APL DERMABOND .7 (GAUZE/BANDAGES/DRESSINGS) ×1
APPLIER CLIP 9.375 MED OPEN (MISCELLANEOUS)
APR CLP MED 9.3 20 MLT OPN (MISCELLANEOUS)
BINDER BREAST LRG (GAUZE/BANDAGES/DRESSINGS) IMPLANT
BINDER BREAST XLRG (GAUZE/BANDAGES/DRESSINGS) IMPLANT
BLADE SURG 10 STRL SS (BLADE) ×1 IMPLANT
BLADE SURG 15 STRL LF DISP TIS (BLADE) ×1 IMPLANT
BLADE SURG 15 STRL SS (BLADE) ×2
CANISTER SUCTION 2500CC (MISCELLANEOUS) ×1 IMPLANT
CHLORAPREP W/TINT 26ML (MISCELLANEOUS) ×2 IMPLANT
CLIP APPLIE 9.375 MED OPEN (MISCELLANEOUS) IMPLANT
CLOTH BEACON ORANGE TIMEOUT ST (SAFETY) ×2 IMPLANT
CONT SPEC 4OZ CLIKSEAL STRL BL (MISCELLANEOUS) IMPLANT
COVER SURGICAL LIGHT HANDLE (MISCELLANEOUS) ×2 IMPLANT
DECANTER SPIKE VIAL GLASS SM (MISCELLANEOUS) ×1 IMPLANT
DERMABOND ADVANCED (GAUZE/BANDAGES/DRESSINGS) ×1
DERMABOND ADVANCED .7 DNX12 (GAUZE/BANDAGES/DRESSINGS) ×1 IMPLANT
DEVICE DUBIN SPECIMEN MAMMOGRA (MISCELLANEOUS) ×2 IMPLANT
DRAPE CHEST BREAST 15X10 FENES (DRAPES) ×2 IMPLANT
DRAPE UTILITY 15X26 W/TAPE STR (DRAPE) ×4 IMPLANT
ELECT COATED BLADE 2.86 ST (ELECTRODE) ×2 IMPLANT
ELECT REM PT RETURN 9FT ADLT (ELECTROSURGICAL) ×2
ELECTRODE REM PT RTRN 9FT ADLT (ELECTROSURGICAL) ×1 IMPLANT
GLOVE BIO SURGEON STRL SZ7.5 (GLOVE) ×3 IMPLANT
GLOVE BIOGEL PI IND STRL 7.0 (GLOVE) IMPLANT
GLOVE BIOGEL PI IND STRL 7.5 (GLOVE) IMPLANT
GLOVE BIOGEL PI INDICATOR 7.0 (GLOVE) ×1
GLOVE BIOGEL PI INDICATOR 7.5 (GLOVE) ×1
GOWN STRL NON-REIN LRG LVL3 (GOWN DISPOSABLE) ×4 IMPLANT
KIT BASIN OR (CUSTOM PROCEDURE TRAY) ×2 IMPLANT
KIT MARKER MARGIN INK (KITS) IMPLANT
KIT ROOM TURNOVER OR (KITS) ×2 IMPLANT
NDL HYPO 25GX1X1/2 BEV (NEEDLE) ×1 IMPLANT
NEEDLE HYPO 25GX1X1/2 BEV (NEEDLE) ×2 IMPLANT
NS IRRIG 1000ML POUR BTL (IV SOLUTION) ×2 IMPLANT
PACK SURGICAL SETUP 50X90 (CUSTOM PROCEDURE TRAY) ×2 IMPLANT
PAD ARMBOARD 7.5X6 YLW CONV (MISCELLANEOUS) ×2 IMPLANT
PENCIL BUTTON HOLSTER BLD 10FT (ELECTRODE) ×2 IMPLANT
SPONGE LAP 18X18 X RAY DECT (DISPOSABLE) ×2 IMPLANT
SUT MON AB 4-0 PC3 18 (SUTURE) ×2 IMPLANT
SUT SILK 2 0 SH (SUTURE) ×1 IMPLANT
SUT VIC AB 3-0 SH 18 (SUTURE) ×2 IMPLANT
SYR BULB 3OZ (MISCELLANEOUS) ×2 IMPLANT
SYR CONTROL 10ML LL (SYRINGE) ×3 IMPLANT
TOWEL OR 17X24 6PK STRL BLUE (TOWEL DISPOSABLE) ×1 IMPLANT
TOWEL OR 17X26 10 PK STRL BLUE (TOWEL DISPOSABLE) ×2 IMPLANT
TUBE CONNECTING 12X1/4 (SUCTIONS) ×1 IMPLANT
YANKAUER SUCT BULB TIP NO VENT (SUCTIONS) ×1 IMPLANT

## 2012-05-07 NOTE — H&P (View-Only) (Signed)
Subjective:     Patient ID: Jacqueline Richmond, female   DOB: 07-03-1945, 67 y.o.   MRN: [id number]  HPI We're asked to see the patient in consultation by Dr. Coletta Memos to evaluate her for a right breast papilloma. The patient is a 67 year old white female who recently went for a routine screening mammogram. At that time she was found to have some abnormal calcifications in the lateral part of the right breast. These were biopsied and came back as a papilloma. She denies any breast pain. She denies any discharge from the nipple. She has no personal or family history of breast problems.  Review of Systems  Constitutional: Negative.   HENT: Negative.   Eyes: Negative.   Respiratory: Negative.   Cardiovascular: Negative.   Gastrointestinal: Negative.   Genitourinary: Negative.   Musculoskeletal: Positive for arthralgias.  Skin: Negative.   Neurological: Negative.   Hematological: Negative.   Psychiatric/Behavioral: Negative.        Objective:   Physical Exam  Constitutional: She is oriented to person, place, and time. She appears well-developed and well-nourished.  HENT:  Head: Normocephalic and atraumatic.  Eyes: Conjunctivae normal and EOM are normal. Pupils are equal, round, and reactive to light.  Neck: Normal range of motion. Neck supple.  Cardiovascular: Normal rate, regular rhythm and normal heart sounds.   Pulmonary/Chest: Effort normal and breath sounds normal.       There is no palpable mass in either breast. There is no palpable axillary or supraclavicular cervical lymphadenopathy  Abdominal: Soft. Bowel sounds are normal. She exhibits no mass. There is no tenderness.  Musculoskeletal: Normal range of motion.  Lymphadenopathy:    She has no cervical adenopathy.  Neurological: She is alert and oriented to person, place, and time.  Skin: Skin is warm and dry.  Psychiatric: She has a normal mood and affect. Her behavior is normal.       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 Because this lesion increases her breast cancer risk I think he would be reasonable to remove it. She would also like to have this done. I've discussed with her in detail the risks and benefits of the operation to remove this area as well as some of the technical aspects and she understands and wishes to proceed.    Plan:     Plan for right breast wire localized lumpectomy

## 2012-05-07 NOTE — Progress Notes (Signed)
Pt. Reports that she has been off Norvasc & Protonix since mid 2013.  She states she was given those two meds while she was here (in hosp.) in mid 2013 for dehydration, but they were new to her then & she stopped them after she was d/c'd to home.

## 2012-05-07 NOTE — Anesthesia Preprocedure Evaluation (Signed)
Anesthesia Evaluation  Patient identified by MRN, date of birth, ID band Patient awake    History of Anesthesia Complications (+) DIFFICULT AIRWAYNegative for: history of anesthetic complications  Airway Mallampati: III    Mouth opening: Limited Mouth Opening  Dental  (+) Teeth Intact, Missing, Caps and Dental Advisory Given   Pulmonary neg pulmonary ROS,  breath sounds clear to auscultation        Cardiovascular hypertension, Rhythm:Regular Rate:Normal     Neuro/Psych negative neurological ROS     GI/Hepatic negative GI ROS, Neg liver ROS,   Endo/Other  diabetes  Renal/GU Renal InsufficiencyRenal disease     Musculoskeletal negative musculoskeletal ROS (+)   Abdominal   Peds  Hematology negative hematology ROS (+)   Anesthesia Other Findings   Reproductive/Obstetrics                           Anesthesia Physical Anesthesia Plan  ASA: II  Anesthesia Plan: General   Post-op Pain Management:    Induction: Intravenous  Airway Management Planned: LMA  Additional Equipment:   Intra-op Plan:   Post-operative Plan: Extubation in OR  Informed Consent: I have reviewed the patients History and Physical, chart, labs and discussed the procedure including the risks, benefits and alternatives for the proposed anesthesia with the patient or authorized representative who has indicated his/her understanding and acceptance.   Dental advisory given  Plan Discussed with: CRNA and Surgeon  Anesthesia Plan Comments:         Anesthesia Quick Evaluation

## 2012-05-07 NOTE — Transfer of Care (Signed)
Immediate Anesthesia Transfer of Care Note  Patient: Jacqueline Richmond  Procedure(s) Performed: Procedure(s): BREAST LUMPECTOMY WITH NEEDLE LOCALIZATION (Right)  Patient Location: PACU  Anesthesia Type:General  Level of Consciousness: awake, alert  and oriented  Airway & Oxygen Therapy: Patient Spontanous Breathing and Patient connected to nasal cannula oxygen  Post-op Assessment: Report given to PACU RN and Post -op Vital signs reviewed and stable  Post vital signs: Reviewed and stable  Complications: No apparent anesthesia complications

## 2012-05-07 NOTE — Anesthesia Postprocedure Evaluation (Signed)
  Anesthesia Post-op Note  Patient: Jacqueline Richmond  Procedure(s) Performed: Procedure(s): BREAST LUMPECTOMY WITH NEEDLE LOCALIZATION (Right)  Patient Location: PACU  Anesthesia Type:General  Level of Consciousness: awake  Airway and Oxygen Therapy: Patient Spontanous Breathing  Post-op Pain: mild  Post-op Assessment: Post-op Vital signs reviewed  Post-op Vital Signs: stable  Complications: No apparent anesthesia complications

## 2012-05-07 NOTE — Op Note (Signed)
05/07/2012  11:18 AM  PATIENT:  Jacqueline Richmond  67 y.o. female  PRE-OPERATIVE DIAGNOSIS:  RIGHT BREAST PAPILLOMA  POST-OPERATIVE DIAGNOSIS:  RIGHT BREAST PAPILLOMA  PROCEDURE:  Procedure(s): BREAST LUMPECTOMY WITH NEEDLE LOCALIZATION (Right)  SURGEON:  Surgeon(s) and Role:    * Merrie Roof, MD - Primary  PHYSICIAN ASSISTANT:   ASSISTANTS: none   ANESTHESIA:   general  EBL:     BLOOD ADMINISTERED:none  DRAINS: none   LOCAL MEDICATIONS USED:  MARCAINE     SPECIMEN:  Source of Specimen:  right breast tissue  DISPOSITION OF SPECIMEN:  PATHOLOGY  COUNTS:  YES  TOURNIQUET:  * No tourniquets in log *  DICTATION: .Dragon Dictation After informed consent was obtained the patient was brought to the operating room and placed in the supine position on the operating table. After adequate induction of general anesthesia the patient's right breast was prepped with ChloraPrep, allowed to dry, and draped in usual sterile manner. Earlier in the day the patient underwent a wire localization procedure and the wire was entering the right breast laterally and headed towards the central breast. A transversely oriented incision was made overlying the path of the wire with a 15 blade knife. This incision was carried through the skin and subcutaneous tissue sharply with the electrocautery until the breast tissue was entered. Once into the breast tissue the path of the wire could be palpated. A circular portion of breast tissue was excised sharply around the tip of the wire. Once this was accomplished the specimen was removed. A specimen radiograph was obtained that showed the calcifications and clip to be in the specimen. The specimen was oriented with a short stitch on the superior surface and a long stitch on the lateral surface. The specimen was then sent to pathology for further evaluation. The wound was irrigated with copious amounts of saline and infiltrated with quarter percent Marcaine. The  deep layer the wound was then closed with interrupted 0 Vicryl stitches. The skin was then closed with interrupted 4 Monocryl subcuticular stitches. Dermabond dressings were applied. The patient tolerated the procedure well. At the end of the case all needle sponge and instrument counts were correct. The patient was then awakened and taken to recovery in stable condition.  PLAN OF CARE: Discharge to home after PACU  PATIENT DISPOSITION:  PACU - hemodynamically stable.   Delay start of Pharmacological VTE agent (>24hrs) due to surgical blood loss or risk of bleeding: not applicable

## 2012-05-07 NOTE — Anesthesia Procedure Notes (Signed)
Procedure Name: LMA Insertion Date/Time: 05/07/2012 10:29 AM Performed by: Melina Copa, Alieah Brinton R Pre-anesthesia Checklist: Patient identified, Emergency Drugs available, Suction available, Patient being monitored and Timeout performed Patient Re-evaluated:Patient Re-evaluated prior to inductionOxygen Delivery Method: Circle system utilized Preoxygenation: Pre-oxygenation with 100% oxygen Intubation Type: IV induction LMA: LMA inserted LMA Size: 4.0 Placement Confirmation: positive ETCO2 and breath sounds checked- equal and bilateral Tube secured with: Tape

## 2012-05-07 NOTE — Preoperative (Signed)
Beta Blockers   Reason not to administer Beta Blockers:Atenolol at 0945 hrs 05/07/12

## 2012-05-07 NOTE — Interval H&P Note (Signed)
History and Physical Interval Note:  05/07/2012 9:58 AM  Jacqueline Richmond  has presented today for surgery, with the diagnosis of RIGHT BREAST PAPILLOMA  The various methods of treatment have been discussed with the patient and family. After consideration of risks, benefits and other options for treatment, the patient has consented to  Procedure(s): BREAST LUMPECTOMY WITH NEEDLE LOCALIZATION (Right) as a surgical intervention .  The patient's history has been reviewed, patient examined, no change in status, stable for surgery.  I have reviewed the patient's chart and labs.  Questions were answered to the patient's satisfaction.     TOTH III,PAUL S

## 2012-05-10 ENCOUNTER — Telehealth (INDEPENDENT_AMBULATORY_CARE_PROVIDER_SITE_OTHER): Payer: Self-pay

## 2012-05-10 MED ORDER — PROPOFOL 10 MG/ML IV BOLUS
INTRAVENOUS | Status: DC | PRN
  Administered 2012-05-07: 145 mg via INTRAVENOUS

## 2012-05-10 NOTE — Telephone Encounter (Signed)
Called pt to check up after surgery. States she is doing good. I gave her a follow up appt.

## 2012-05-10 NOTE — Addendum Note (Signed)
Addendum created 05/10/12 1034 by Suzy Bouchard, CRNA   Modules edited: Anesthesia Medication Administration

## 2012-05-12 ENCOUNTER — Encounter (HOSPITAL_COMMUNITY): Payer: Self-pay | Admitting: General Surgery

## 2012-05-12 ENCOUNTER — Telehealth (INDEPENDENT_AMBULATORY_CARE_PROVIDER_SITE_OTHER): Payer: Self-pay

## 2012-05-12 NOTE — Telephone Encounter (Signed)
Pt returned call and was given "benign" path results.

## 2012-05-12 NOTE — Telephone Encounter (Signed)
Message copied by Carlene Coria on Wed May 12, 2012 11:09 AM ------      Message from: Luella Cook III      Created: Wed May 12, 2012 10:42 AM       Looks benign ------

## 2012-05-12 NOTE — Telephone Encounter (Signed)
LMOM to call back. Path results benign.

## 2012-05-17 ENCOUNTER — Encounter (INDEPENDENT_AMBULATORY_CARE_PROVIDER_SITE_OTHER): Payer: Self-pay | Admitting: General Surgery

## 2012-05-17 ENCOUNTER — Ambulatory Visit (INDEPENDENT_AMBULATORY_CARE_PROVIDER_SITE_OTHER): Payer: BC Managed Care – PPO | Admitting: General Surgery

## 2012-05-17 VITALS — BP 118/68 | HR 64 | Temp 97.0°F | Resp 18 | Ht 64.5 in | Wt 187.6 lb

## 2012-05-17 DIAGNOSIS — D249 Benign neoplasm of unspecified breast: Secondary | ICD-10-CM

## 2012-05-17 NOTE — Progress Notes (Signed)
Subjective:     Patient ID: Jacqueline Richmond, female   DOB: Nov 17, 1945, 67 y.o.   MRN: [id number]  HPI The patient is a 67 year old white female who is about 2 weeks status post right lumpectomy for a papilloma. She is doing well and has no complaints today.  Review of Systems     Objective:   Physical Exam On exam her right breast incision is healing nicely with no sign of infection or significant seroma. She does have some bruising of the soft tissue around the incision.    Assessment:     The patient is 2 weeks status post right lumpectomy for benign disease     Plan:     At this point we will plan to see her back in one month to check her progress.

## 2012-05-18 ENCOUNTER — Encounter (INDEPENDENT_AMBULATORY_CARE_PROVIDER_SITE_OTHER): Payer: BC Managed Care – PPO | Admitting: General Surgery

## 2012-06-18 ENCOUNTER — Encounter (INDEPENDENT_AMBULATORY_CARE_PROVIDER_SITE_OTHER): Payer: BC Managed Care – PPO | Admitting: General Surgery

## 2012-08-06 DIAGNOSIS — L409 Psoriasis, unspecified: Secondary | ICD-10-CM | POA: Insufficient documentation

## 2013-05-24 ENCOUNTER — Encounter (INDEPENDENT_AMBULATORY_CARE_PROVIDER_SITE_OTHER): Payer: Self-pay

## 2013-06-03 DIAGNOSIS — M1A00X0 Idiopathic chronic gout, unspecified site, without tophus (tophi): Secondary | ICD-10-CM | POA: Insufficient documentation

## 2013-06-03 DIAGNOSIS — Z6833 Body mass index (BMI) 33.0-33.9, adult: Secondary | ICD-10-CM | POA: Insufficient documentation

## 2013-08-27 DIAGNOSIS — Z0000 Encounter for general adult medical examination without abnormal findings: Secondary | ICD-10-CM | POA: Insufficient documentation

## 2013-12-27 ENCOUNTER — Encounter: Payer: Self-pay | Admitting: Gastroenterology

## 2015-06-19 ENCOUNTER — Ambulatory Visit (INDEPENDENT_AMBULATORY_CARE_PROVIDER_SITE_OTHER): Payer: Medicare Other | Admitting: Family Medicine

## 2015-06-19 VITALS — BP 114/64 | HR 67 | Temp 97.7°F | Resp 16 | Ht 62.5 in | Wt 180.0 lb

## 2015-06-19 DIAGNOSIS — H00033 Abscess of eyelid right eye, unspecified eyelid: Secondary | ICD-10-CM | POA: Diagnosis not present

## 2015-06-19 MED ORDER — CLINDAMYCIN HCL 300 MG PO CAPS: 300.0000 mg | ORAL_CAPSULE | Freq: Three times a day (TID) | ORAL | Status: DC

## 2015-06-19 NOTE — Progress Notes (Addendum)
Subjective:  By signing my name below, I, Moises Blood, attest that this documentation has been prepared under the direction and in the presence of Merri Ray, MD. Electronically Signed: Moises Blood, [place]. 06/19/2015 , 6:15 PM .  Patient was seen in Room 2 .   Patient ID: Jacqueline Richmond, female    DOB: [birth year], 70 y.o.   MRN: [id number] Chief Complaint  Patient presents with  . Eye Problem    right, x 4 days   HPI Jacqueline Richmond is a 70 y.o. female H/o DM, HTN and chronic kidney disease, other medical problems per problems list.   Patient is here for right eye problem that started 5 days ago. She thought it was pink eye initially and her eye lid "hurts like the devil". It is blurry to read but she is able to see while driving. She had false eye lashes placed on her eyes bilaterally the day before. She states that it feels itchy occasionally. She's applied OTC pink eye drops without relief. She notes that it is still itchy today without much change. She's also tried applied neosporin on her right eye lid, and doesn't believe she put any into her eye. She does mention some eye soreness if she moves her eye left to the right. She's noticed some drainage from the medial corner of her right eye and cleaned the area with a q-tip. She denies history of styes.   Patient Active Problem List   Diagnosis Date Noted  . 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  . Type II or unspecified type diabetes mellitus without mention of complication, not stated as uncontrolled 08/29/2011  . CKD (chronic kidney disease) stage 3, GFR 30-59 ml/min 08/29/2011  . ARF (acute renal failure) ([place]) 08/29/2011  . Hyperkalemia 08/29/2011  . HTN (hypertension) 08/29/2011  . Gout 08/29/2011   Past Medical History  Diagnosis Date  . Hypertension   . Type II or unspecified type diabetes mellitus without mention of complication, not stated as uncontrolled 08/29/2011  . HTN (hypertension) 08/29/2011  . CKD (chronic kidney  disease) stage 3, GFR 30-59 ml/min 08/29/2011  . Arthritis   . Psoriasis   . Gout   . Hyperlipemia   . Complication of anesthesia   . Difficult intubation     small mouth opening   Past Surgical History  Procedure Laterality Date  . Cholecystectomy    . Hernia repair    . Hip surgery    . Salpingoophorectomy    . Rhinoplasty    . Breast lumpectomy with needle localization Right 05/07/2012    Procedure: BREAST LUMPECTOMY WITH NEEDLE LOCALIZATION;  Surgeon: Merrie Roof, MD;  Location: [place];  Service: General;  Laterality: Right;   Allergies  Allergen Reactions  . Codeine     REACTION: itching  . Iodinated Diagnostic Agents     Other reaction(s): Unknown  . Pentazocine   . Pentazocine Lactate     REACTION: hallucinations  . Statins    Prior to Admission medications   Medication Sig Start Date End Date Taking? Authorizing Provider  ALLOPURINOL PO Take by mouth.   Yes Historical Provider, MD  atenolol (TENORMIN) 100 MG tablet Take 100 mg by mouth daily.   Yes Historical Provider, MD  cloNIDine (CATAPRES) 0.3 MG tablet Take 0.3 mg by mouth 2 (two) times daily.   Yes Historical Provider, MD  ergocalciferol (VITAMIN D2) 50000 units capsule Take 50,000 Units by mouth once a week.   Yes Historical Provider, MD  fenofibrate 54 MG tablet Take 1 tablet (54 mg total) by mouth at bedtime. 09/02/11 06/19/15 Yes Thurnell Lose, MD  fish oil-omega-3 fatty acids 1000 MG capsule Take 1 g by mouth daily. Reported on 06/19/2015   Yes Historical Provider, MD  furosemide (LASIX) 20 MG tablet Take 20 mg by mouth 2 (two) times daily.   Yes Historical Provider, MD  Garlic Oil (GARLIC 99991111) 99991111 MG CAPS Take 2 capsules by mouth daily. Reported on 06/19/2015   Yes Historical Provider, MD  glipiZIDE (GLUCOTROL XL) 5 MG 24 hr tablet Take 5 mg by mouth daily.   Yes Historical Provider, MD  niacin 500 MG tablet Take 500 mg by mouth 2 (two) times daily with a meal. Reported on 06/19/2015   Yes Historical  Provider, MD  SITagliptin Phosphate (JANUVIA PO) Take by mouth.   Yes Historical Provider, MD  amLODipine (NORVASC) 5 MG tablet Take 1 tablet (5 mg total) by mouth daily. 09/02/11 09/01/12  Thurnell Lose, MD  aspirin EC 81 MG tablet Take 81 mg by mouth daily. Reported on 06/19/2015    Historical Provider, MD  CINNAMON PO Take 1 capsule by mouth daily. Reported on 06/19/2015    Historical Provider, MD  febuxostat (ULORIC) 40 MG tablet Take 80 mg by mouth daily. Reported on 06/19/2015    Historical Provider, MD  indomethacin (INDOCIN) 25 MG capsule Take 25 mg by mouth 2 (two) times daily as needed. Reported on 06/19/2015    Historical Provider, MD  pantoprazole (PROTONIX) 40 MG tablet Take 1 tablet (40 mg total) by mouth daily at 6 (six) AM. 09/02/11 09/01/12  Thurnell Lose, MD  traMADol (ULTRAM) 50 MG tablet Take 1-2 tablets (50-100 mg total) by mouth every 6 (six) hours as needed for pain. Patient not taking: Reported on 06/19/2015 05/07/12   Autumn Messing III, MD  vitamin B-12 (CYANOCOBALAMIN) 100 MCG tablet Take 100 mcg by mouth daily. Reported on 06/19/2015    Historical Provider, MD   Social History   Social History  . Marital Status: Married    Spouse Name: N/A  . Number of Children: N/A  . Years of Education: N/A   Occupational History  . Not on file.   Social History Main Topics  . Smoking status: Never Smoker   . Smokeless tobacco: Never Used  . Alcohol Use: Yes     Comment: Drinks on Special Occasions  . Drug Use: No  . Sexual Activity: No   Other Topics Concern  . Not on file   Social History Narrative   Review of Systems  Constitutional: Negative for fever, chills and fatigue.  Eyes: Positive for pain, discharge and itching. Negative for photophobia, redness and visual disturbance.  Gastrointestinal: Negative for nausea, vomiting and diarrhea.  Neurological: Negative for light-headedness and headaches.       Objective:   Physical Exam  Constitutional: She is oriented  to person, place, and time. She appears well-developed and well-nourished. No distress.  HENT:  Head: Normocephalic and atraumatic.  Eyes: EOM are normal. Pupils are equal, round, and reactive to light.  Slit lamp exam:      The right eye shows no fluorescein uptake.  Right upper lid with false eye lash adhered with diffuse erythema of upper lid and edema of mid aspect, some pain into eye and eye lid when looking up and left, anterior chamber clear, small amount of white exudate at the medial canthus, some ointment on the lid; there is soft tissue  swelling of right anterior lid, no lesions on lower lid; pustule on mid to inner 3rd just above the eye lash with surrounding erythema and edema; erythema extending across lid margin border of eye lid with minimal extension to mid aspect of upper eye lid; no internal stye  2 drops of Proparacaine to the right eye applied: Some sediment or ointment appearing substance in the right eye  Fluorescein applied: no focal uptake  Neck: Neck supple.  Cardiovascular: Normal rate.   Pulmonary/Chest: Effort normal. No respiratory distress.  Musculoskeletal: Normal range of motion.  Lymphadenopathy:       Head (right side): No preauricular and no posterior auricular adenopathy present.  Neurological: She is alert and oriented to person, place, and time.  Skin: Skin is warm and dry.  Psychiatric: She has a normal mood and affect. Her behavior is normal.  Nursing note and vitals reviewed.   Filed Vitals:   06/19/15 1647  BP: 114/64  Pulse: 67  Temp: 97.7 F (36.5 C)  Resp: 16  Height: 5' 2.5" (1.588 m)  Weight: 180 lb (81.647 kg)  SpO2: 98%    Visual Acuity Screening   Right eye Left eye Both eyes  Without correction: 0 20/30-1 20/30-1  With correction:         Assessment & Plan:   BRODY HURRY is a 70 y.o. female Eyelid cellulitis, right - Plan: clindamycin (CLEOCIN) 300 MG capsule, Ambulatory referral to Ophthalmology  Possible initial  contact dermatitis or irritation from the artificial lids, now with appearance of eyelid cellulitis with small abscess on the medial aspect. Possible early preseptal cellulitis based on appearance and some inferior lid swelling without erythema inferiorly. This have some discomfort with looking up and left, but doubt orbital cellulitis at this time.  -avoid any further ointments to the eyes as this is likely her reason for difficulty seen today. There is no fluorescein uptake so doubt intraocular involvement.  -remove artificial eyelashes.   -Will treat for preseptal cellulitis with clindamycin 800 mg 3 times a day  - refer to optho in am.   -warm compresses.   - rtc precautions.  Meds ordered this encounter  Medications  . ALLOPURINOL PO    Sig: Take by mouth.  Marland Kitchen SITagliptin Phosphate (JANUVIA PO)    Sig: Take by mouth.  . ergocalciferol (VITAMIN D2) 50000 units capsule    Sig: Take 50,000 Units by mouth once a week.  . clindamycin (CLEOCIN) 300 MG capsule    Sig: Take 1 capsule (300 mg total) by mouth 3 (three) times daily.    Dispense:  30 capsule    Refill:  0   Patient Instructions       IF you received an x-ray today, you will receive an invoice from [facility]. Please contact [facility] at [phone] with questions or concerns regarding your invoice.   IF you received labwork today, you will receive an invoice from [employer]. Please contact [facility] at [phone] with questions or concerns regarding your invoice.   Our billing staff will not be able to assist you with questions regarding bills from these companies.  You will be contacted with the lab results as soon as they are available. The fastest way to get your results is to activate your My Chart account. Instructions are located on the last page of this paperwork. If you have not heard from [place] regarding the results in 2 weeks, please contact this office.     You  appear to have a cellulitis or abscess of your upper eyelid. This can sometimes start as a stye, but appears to be more severe than just a basic stye. Sometimes this can spread into the tissues around the eye, so would like you to see an ophthalmologist tomorrow morning. For now we'll start an antibiotic that should cover for most bacteria that can cause this infection, use warm compresses to the eyelid at least 4 times per day. Avoid any more appointments around the eye or into the eye for now, so can recheck your vision tomorrow at ophthalmology. Remove the false eyelashes tonight with warm compress first, and if any worsening overnight, go to emergency room.  Preseptal Cellulitis, Adult Preseptal cellulitis--also called periorbital cellulitis--is an infection that can affect your eyelid and the soft tissues or skin that surround your eye. The infection may also affect the structures that produce and drain your tears. It does not affect your eye itself. CAUSES This condition may be caused by:  Bacterial infection.  Long-term (chronic) sinus infections.  An object (foreign body) that is stuck behind the eye.  An injury that:  Goes through the eyelid tissues.  Causes an infection, such as an insect sting.  Fracture of the bone around the eye.  Infections that have spread from the eyelid or other structures around the eye.  Bite wounds.  Inflammation or infection of the lining membranes of the brain (meningitis).  An infection in the blood (septicemia).  Dental infection (abscess).  Viral infection. This is rare. RISK FACTORS Risk factors for preseptal cellulitis include:  Participating in activities that increase your risk of trauma to the face or head, such as boxing or high-speed activities.  Having a weakened defense system (immune system).  Medical conditions, such as nasal polyps, that increase your risk for frequent or recurrent sinus infections.  Not receiving regular  dental care. SYMPTOMS Symptoms of this condition usually come on suddenly. Symptoms may include:  Red, hot, and swollen eyelids.  Fever.  Difficulty opening your eye.  Eye pain. DIAGNOSIS This condition may be diagnosed by an eye exam. You may also have tests, such as:  Blood tests.  CT scan.  MRI.  Spinal tap (lumbar puncture). This is a procedure that involves removing and examining a small amount of the fluid that surrounds the brain and spinal cord. This checks for meningitis. TREATMENT Treatment for this condition will include antibiotic medicines. These may be given by mouth (orally), through an IV, or as a shot. Your health care provider may also recommend nasal decongestants to reduce swelling. HOME CARE INSTRUCTIONS  Take your antibiotic medicine as directed by your health care provider. Finish all of it even if you start to feel better.  Take medicines only as directed by your health care provider.  Drink enough fluid to keep your urine clear or pale yellow.  Do not use any tobacco products, including cigarettes, chewing tobacco, or electronic cigarettes. If you need help quitting, ask your health care provider.  Keep all follow-up visits as directed by your health care provider. These include any visits with an eye specialist (ophthalmologist) or dentist. SEEK MEDICAL CARE IF:  You have a fever.  Your eyelids become more red, warm, or swollen.  You have new symptoms.  Your symptoms do not get better with treatment. SEEK IMMEDIATE MEDICAL CARE IF:  You develop double vision, or your vision becomes blurred or worsens in any way.  You have trouble moving your eyes.  Your eye  looks like it is sticking out or bulging out (proptosis).  You develop a severe headache, severe neck pain, or neck stiffness.  You develop repeated vomiting.   This information is not intended to replace advice given to you by your health care provider. Make sure you discuss any  questions you have with your health care provider.   Document Released: 04/12/2010 Document Revised: 07/25/2014 Document Reviewed: 03/06/2014 Elsevier Interactive Patient Education [employer].     I personally performed the services described in this documentation, which was scribed in my presence. The recorded information has been reviewed and considered, and addended by me as needed.

## 2015-06-19 NOTE — Patient Instructions (Addendum)
IF you received an x-ray today, you will receive an invoice from [facility]. Please contact [facility] at [phone] with questions or concerns regarding your invoice.   IF you received labwork today, you will receive an invoice from [employer]. Please contact [facility] at [phone] with questions or concerns regarding your invoice.   Our billing staff will not be able to assist you with questions regarding bills from these companies.  You will be contacted with the lab results as soon as they are available. The fastest way to get your results is to activate your My Chart account. Instructions are located on the last page of this paperwork. If you have not heard from [place] regarding the results in 2 weeks, please contact this office.     You appear to have a cellulitis or abscess of your upper eyelid. This can sometimes start as a stye, but appears to be more severe than just a basic stye. Sometimes this can spread into the tissues around the eye, so would like you to see an ophthalmologist tomorrow morning. For now we'll start an antibiotic that should cover for most bacteria that can cause this infection, use warm compresses to the eyelid at least 4 times per day. Avoid any more appointments around the eye or into the eye for now, so can recheck your vision tomorrow at ophthalmology. Remove the false eyelashes tonight with warm compress first, and if any worsening overnight, go to emergency room.  Preseptal Cellulitis, Adult Preseptal cellulitis--also called periorbital cellulitis--is an infection that can affect your eyelid and the soft tissues or skin that surround your eye. The infection may also affect the structures that produce and drain your tears. It does not affect your eye itself. CAUSES This condition may be caused by:  Bacterial infection.  Long-term (chronic) sinus infections.  An object (foreign body) that is stuck behind  the eye.  An injury that:  Goes through the eyelid tissues.  Causes an infection, such as an insect sting.  Fracture of the bone around the eye.  Infections that have spread from the eyelid or other structures around the eye.  Bite wounds.  Inflammation or infection of the lining membranes of the brain (meningitis).  An infection in the blood (septicemia).  Dental infection (abscess).  Viral infection. This is rare. RISK FACTORS Risk factors for preseptal cellulitis include:  Participating in activities that increase your risk of trauma to the face or head, such as boxing or high-speed activities.  Having a weakened defense system (immune system).  Medical conditions, such as nasal polyps, that increase your risk for frequent or recurrent sinus infections.  Not receiving regular dental care. SYMPTOMS Symptoms of this condition usually come on suddenly. Symptoms may include:  Red, hot, and swollen eyelids.  Fever.  Difficulty opening your eye.  Eye pain. DIAGNOSIS This condition may be diagnosed by an eye exam. You may also have tests, such as:  Blood tests.  CT scan.  MRI.  Spinal tap (lumbar puncture). This is a procedure that involves removing and examining a small amount of the fluid that surrounds the brain and spinal cord. This checks for meningitis. TREATMENT Treatment for this condition will include antibiotic medicines. These may be given by mouth (orally), through an IV, or as a shot. Your health care provider may also recommend nasal decongestants to reduce swelling. HOME CARE INSTRUCTIONS  Take your antibiotic medicine as directed by your health care provider. Finish all of it  even if you start to feel better.  Take medicines only as directed by your health care provider.  Drink enough fluid to keep your urine clear or pale yellow.  Do not use any tobacco products, including cigarettes, chewing tobacco, or electronic cigarettes. If you need help  quitting, ask your health care provider.  Keep all follow-up visits as directed by your health care provider. These include any visits with an eye specialist (ophthalmologist) or dentist. SEEK MEDICAL CARE IF:  You have a fever.  Your eyelids become more red, warm, or swollen.  You have new symptoms.  Your symptoms do not get better with treatment. SEEK IMMEDIATE MEDICAL CARE IF:  You develop double vision, or your vision becomes blurred or worsens in any way.  You have trouble moving your eyes.  Your eye looks like it is sticking out or bulging out (proptosis).  You develop a severe headache, severe neck pain, or neck stiffness.  You develop repeated vomiting.   This information is not intended to replace advice given to you by your health care provider. Make sure you discuss any questions you have with your health care provider.   Document Released: 04/12/2010 Document Revised: 07/25/2014 Document Reviewed: 03/06/2014 Elsevier Interactive Patient Education [employer].

## 2015-06-20 ENCOUNTER — Telehealth: Payer: Self-pay | Admitting: Family Medicine

## 2015-06-20 NOTE — Telephone Encounter (Signed)
Patient scheduled with Dr. Clent Jacks today at 2:00 pm. I called patient to notify, voice mail full, unable to leave message. Will keep trying

## 2015-06-20 NOTE — Telephone Encounter (Signed)
Spoke with patient, notified, voiced understanding

## 2015-09-10 DIAGNOSIS — Z9181 History of falling: Secondary | ICD-10-CM | POA: Insufficient documentation

## 2015-09-10 DIAGNOSIS — Z1159 Encounter for screening for other viral diseases: Secondary | ICD-10-CM | POA: Insufficient documentation

## 2015-10-01 DIAGNOSIS — M25551 Pain in right hip: Secondary | ICD-10-CM | POA: Insufficient documentation

## 2015-10-03 ENCOUNTER — Ambulatory Visit: Payer: Medicare Other

## 2015-10-03 ENCOUNTER — Telehealth: Payer: Self-pay

## 2015-10-03 NOTE — Telephone Encounter (Signed)
Pt called to let nicole know that she finally got in touch with dr Maureen Ralphs and that nicole will not need to call

## 2015-10-03 NOTE — Telephone Encounter (Signed)
Was this message meant for someone else? I have never seen this patient.

## 2016-06-24 DIAGNOSIS — G8929 Other chronic pain: Secondary | ICD-10-CM | POA: Insufficient documentation

## 2016-07-30 DIAGNOSIS — M25561 Pain in right knee: Secondary | ICD-10-CM | POA: Insufficient documentation

## 2017-04-24 ENCOUNTER — Other Ambulatory Visit (HOSPITAL_COMMUNITY): Payer: Self-pay | Admitting: Nephrology

## 2017-04-24 DIAGNOSIS — N184 Chronic kidney disease, stage 4 (severe): Secondary | ICD-10-CM

## 2017-04-30 ENCOUNTER — Other Ambulatory Visit: Payer: Self-pay | Admitting: Radiology

## 2017-04-30 ENCOUNTER — Other Ambulatory Visit: Payer: Self-pay | Admitting: General Surgery

## 2017-05-01 ENCOUNTER — Ambulatory Visit (HOSPITAL_COMMUNITY)
Admission: RE | Admit: 2017-05-01 | Discharge: 2017-05-01 | Disposition: A | Discharge status: Home / Self Care | Payer: Medicare Other | Source: Ambulatory Visit | Attending: Nephrology | Admitting: Nephrology

## 2017-05-01 ENCOUNTER — Encounter (HOSPITAL_COMMUNITY): Payer: Self-pay

## 2017-05-01 DIAGNOSIS — Z9049 Acquired absence of other specified parts of digestive tract: Secondary | ICD-10-CM | POA: Insufficient documentation

## 2017-05-01 DIAGNOSIS — I129 Hypertensive chronic kidney disease with stage 1 through stage 4 chronic kidney disease, or unspecified chronic kidney disease: Secondary | ICD-10-CM | POA: Insufficient documentation

## 2017-05-01 DIAGNOSIS — Z79899 Other long term (current) drug therapy: Secondary | ICD-10-CM | POA: Diagnosis not present

## 2017-05-01 DIAGNOSIS — Z91041 Radiographic dye allergy status: Secondary | ICD-10-CM | POA: Insufficient documentation

## 2017-05-01 DIAGNOSIS — Z880 Allergy status to penicillin: Secondary | ICD-10-CM | POA: Diagnosis not present

## 2017-05-01 DIAGNOSIS — L409 Psoriasis, unspecified: Secondary | ICD-10-CM | POA: Insufficient documentation

## 2017-05-01 DIAGNOSIS — Z7982 Long term (current) use of aspirin: Secondary | ICD-10-CM | POA: Diagnosis not present

## 2017-05-01 DIAGNOSIS — Z8261 Family history of arthritis: Secondary | ICD-10-CM | POA: Insufficient documentation

## 2017-05-01 DIAGNOSIS — Z888 Allergy status to other drugs, medicaments and biological substances status: Secondary | ICD-10-CM | POA: Diagnosis not present

## 2017-05-01 DIAGNOSIS — Z8249 Family history of ischemic heart disease and other diseases of the circulatory system: Secondary | ICD-10-CM | POA: Insufficient documentation

## 2017-05-01 DIAGNOSIS — Z885 Allergy status to narcotic agent status: Secondary | ICD-10-CM | POA: Insufficient documentation

## 2017-05-01 DIAGNOSIS — E785 Hyperlipidemia, unspecified: Secondary | ICD-10-CM | POA: Insufficient documentation

## 2017-05-01 DIAGNOSIS — E1122 Type 2 diabetes mellitus with diabetic chronic kidney disease: Secondary | ICD-10-CM | POA: Insufficient documentation

## 2017-05-01 DIAGNOSIS — N184 Chronic kidney disease, stage 4 (severe): Secondary | ICD-10-CM | POA: Diagnosis present

## 2017-05-01 DIAGNOSIS — Z7984 Long term (current) use of oral hypoglycemic drugs: Secondary | ICD-10-CM | POA: Diagnosis not present

## 2017-05-01 DIAGNOSIS — M109 Gout, unspecified: Secondary | ICD-10-CM | POA: Diagnosis not present

## 2017-05-01 LAB — CBC
HCT: 39.8 % (ref 36.0–46.0)
Hemoglobin: 12.8 g/dL (ref 12.0–15.0)
MCH: 27.5 pg (ref 26.0–34.0)
MCHC: 32.2 g/dL (ref 30.0–36.0)
MCV: 85.6 fL (ref 78.0–100.0)
Platelets: 215 10*3/uL (ref 150–400)
RBC: 4.65 MIL/uL (ref 3.87–5.11)
RDW: 15.2 % (ref 11.5–15.5)
WBC: 9.1 10*3/uL (ref 4.0–10.5)

## 2017-05-01 LAB — PROTIME-INR
INR: 1.04
PROTHROMBIN TIME: 13.5 s (ref 11.4–15.2)

## 2017-05-01 LAB — GLUCOSE, CAPILLARY: Glucose-Capillary: 152 mg/dL — ABNORMAL HIGH (ref 65–99)

## 2017-05-01 MED ORDER — MIDAZOLAM HCL 2 MG/2ML IJ SOLN
INTRAMUSCULAR | Status: AC
  Filled 2017-05-01: qty 2

## 2017-05-01 MED ORDER — CLONIDINE HCL 0.3 MG PO TABS
0.3000 mg | ORAL_TABLET | Freq: Once | ORAL | Status: AC
  Administered 2017-05-01: 0.3 mg via ORAL
  Filled 2017-05-01: qty 1

## 2017-05-01 MED ORDER — SODIUM CHLORIDE 0.9 % IV SOLN
INTRAVENOUS | Status: AC | PRN
  Administered 2017-05-01: 10 mL/h via INTRAVENOUS

## 2017-05-01 MED ORDER — FENTANYL CITRATE (PF) 100 MCG/2ML IJ SOLN
INTRAMUSCULAR | Status: AC | PRN
  Administered 2017-05-01: 25 ug via INTRAVENOUS

## 2017-05-01 MED ORDER — ATENOLOL 100 MG PO TABS
100.0000 mg | ORAL_TABLET | Freq: Once | ORAL | Status: AC
  Administered 2017-05-01: 100 mg via ORAL
  Filled 2017-05-01: qty 1

## 2017-05-01 MED ORDER — GELATIN ABSORBABLE 12-7 MM EX MISC
CUTANEOUS | Status: AC
  Filled 2017-05-01: qty 1

## 2017-05-01 MED ORDER — FENTANYL CITRATE (PF) 100 MCG/2ML IJ SOLN
INTRAMUSCULAR | Status: AC
  Filled 2017-05-01: qty 2

## 2017-05-01 MED ORDER — SODIUM CHLORIDE 0.9 % IV SOLN: INTRAVENOUS | Status: DC

## 2017-05-01 MED ORDER — LIDOCAINE HCL (PF) 1 % IJ SOLN
INTRAMUSCULAR | Status: AC
  Filled 2017-05-01: qty 30

## 2017-05-01 MED ORDER — MIDAZOLAM HCL 2 MG/2ML IJ SOLN
INTRAMUSCULAR | Status: AC | PRN
  Administered 2017-05-01 (×2): 0.5 mg via INTRAVENOUS

## 2017-05-01 NOTE — Discharge Instructions (Addendum)

## 2017-05-01 NOTE — H&P (Signed)
Chief Complaint: Patient was seen in consultation today for random renal biopsy at the request of [place]  Referring Physician(s): [place]  Supervising Physician: Corrie Mckusick  Patient Status: [facility] - Out-pt  History of Present Illness: Jacqueline Richmond is a 72 y.o. female   Followed by Dr Posey Pronto x approx 6 yrs Renal function progressively worsening Reduction in GFR Now scheduled for random renal biopsy  Past Medical History:  Diagnosis Date  . Arthritis   . CKD (chronic kidney disease) stage 3, GFR 30-59 ml/min (HCC) 08/29/2011  . Complication of anesthesia   . Difficult intubation    small mouth opening  . Gout   . HTN (hypertension) 08/29/2011  . Hyperlipemia   . Hypertension   . Psoriasis   . Type II or unspecified type diabetes mellitus without mention of complication, not stated as uncontrolled 08/29/2011    Past Surgical History:  Procedure Laterality Date  . BREAST LUMPECTOMY WITH NEEDLE LOCALIZATION Right 05/07/2012   Procedure: BREAST LUMPECTOMY WITH NEEDLE LOCALIZATION;  Surgeon: Merrie Roof, MD;  Location: [place];  Service: General;  Laterality: Right;  . CHOLECYSTECTOMY    . HERNIA REPAIR    . HIP SURGERY    . RHINOPLASTY    . SALPINGOOPHORECTOMY      Allergies: Codeine; Iodinated diagnostic agents; Pentazocine; Pentazocine lactate; and Statins  Medications: Prior to Admission medications   Medication Sig Start Date End Date Taking? Authorizing Provider  allopurinol (ZYLOPRIM) 300 MG tablet Take 1 tablet by mouth daily.    Yes [provider]  aspirin EC 81 MG tablet Take 81 mg by mouth daily. Reported on 06/19/2015   Yes [provider]  atenolol (TENORMIN) 100 MG tablet Take 100 mg by mouth daily.   Yes [provider]  b complex vitamins tablet Take 1 tablet by mouth daily.   Yes [provider]  CINNAMON PO Take 2 capsules by mouth 2 (two) times daily. Reported on 06/19/2015   Yes [provider]    cloNIDine (CATAPRES) 0.3 MG tablet Take 0.3 mg by mouth 2 (two) times daily.   Yes [provider]  fish oil-omega-3 fatty acids 1000 MG capsule Take 2 g by mouth 2 (two) times daily. Reported on 06/19/2015   Yes [provider]  furosemide (LASIX) 40 MG tablet Take 20 mg by mouth daily.    Yes [provider]  Garlic Oil (GARLIC 8786) 7672 MG CAPS Take 2 capsules by mouth 2 (two) times daily. Reported on 06/19/2015   Yes [provider]  glipiZIDE (GLUCOTROL) 10 MG tablet Take 10 mg by mouth daily before breakfast.   Yes [provider]  loratadine (CLARITIN) 10 MG tablet Take 10 mg by mouth daily.   Yes [provider]  niacin 500 MG tablet Take 500 mg by mouth 2 (two) times daily with a meal. Reported on 06/19/2015   Yes [provider]  sitaGLIPtin (JANUVIA) 50 MG tablet Take 1 tablet by mouth every morning.    Yes [provider]  fenofibrate 54 MG tablet Take 1 tablet (54 mg total) by mouth at bedtime. 09/02/11 04/27/17  Thurnell Lose, MD     Family History  Problem Relation Age of Onset  . Heart disease Father   . Arthritis Sister     Social History   Socioeconomic History  . Marital status: Married    Spouse name: None  . Number of children: None  . Years of education: None  .  Highest education level: None  Social Needs  . Financial resource strain: None  . Food insecurity - worry: None  . Food insecurity - inability: None  . Transportation needs - medical: None  . Transportation needs - non-medical: None  Occupational History  . None  Tobacco Use  . Smoking status: Never Smoker  . Smokeless tobacco: Never Used  Substance and Sexual Activity  . Alcohol use: Yes    Comment: Drinks on Special Occasions  . Drug use: No  . Sexual activity: No  Other Topics Concern  . None  Social History Narrative  . None     Review of Systems: A 12 point ROS discussed and pertinent positives are indicated in  the HPI above.  All other systems are negative.  Review of Systems  Constitutional: Negative for activity change, appetite change, fatigue and fever.  Respiratory: Negative for cough and shortness of breath.   Cardiovascular: Negative for chest pain.  Gastrointestinal: Negative for abdominal pain.  Musculoskeletal: Negative for back pain.  Psychiatric/Behavioral: Negative for behavioral problems and confusion.    Vital Signs: BP 133/65   Pulse 75   Temp 98.1 F (36.7 C) (Oral)   Resp 16   Ht 5\' 1"  (1.549 m)   Wt 169 lb (76.7 kg)   SpO2 100%   BMI 31.93 kg/m   Physical Exam  Constitutional: She is oriented to person, place, and time.  Cardiovascular: Normal rate, regular rhythm and normal heart sounds.  Pulmonary/Chest: Effort normal and breath sounds normal.  Abdominal: Soft. Bowel sounds are normal.  Musculoskeletal: Normal range of motion.  Neurological: She is alert and oriented to person, place, and time.  Skin: Skin is warm and dry.  Psychiatric: She has a normal mood and affect. Her behavior is normal. Judgment and thought content normal.  Nursing note and vitals reviewed.   Imaging: No results found.  Labs:  CBC: No results for input(s): WBC, HGB, HCT, PLT in the last 8760 hours.  COAGS: No results for input(s): INR, APTT in the last 8760 hours.  BMP: No results for input(s): NA, K, CL, CO2, GLUCOSE, BUN, CALCIUM, CREATININE, GFRNONAA, GFRAA in the last 8760 hours.  Invalid input(s): CMP  LIVER FUNCTION TESTS: No results for input(s): BILITOT, AST, ALT, ALKPHOS, PROT, ALBUMIN in the last 8760 hours.  TUMOR MARKERS: No results for input(s): AFPTM, CEA, CA199, CHROMGRNA in the last 8760 hours.  Assessment and Plan:  Worsening renal fxn Progressive renal failure Scheduled now for random renal biopsy Risks and benefits discussed with the patient including, but not limited to bleeding, infection, damage to adjacent structures or low yield requiring  additional tests.  All of the patient's questions were answered, patient is agreeable to proceed. Consent signed and in chart.   Thank you for this interesting consult.  I greatly enjoyed meeting AMAL RENBARGER and look forward to participating in their care.  A copy of this report was sent to the requesting provider on this date.  Electronically Signed: Lavonia Drafts, PA-C 05/01/2017, 6:54 AM   I spent a total of  30 Minutes   in face to face in clinical consultation, greater than 50% of which was counseling/coordinating care for random renal bx

## 2017-05-01 NOTE — Sedation Documentation (Signed)
Patient is resting comfortably. 

## 2017-05-01 NOTE — Progress Notes (Signed)
Pt ambulated to bathroom and voided large amt of clear urine.  No complaints of burning or pain.

## 2017-05-01 NOTE — Procedures (Signed)
Interventional Radiology Procedure Note  Procedure: US guided left kidney biopsy, medical renal.  Complications: None Recommendations:  - Ok to shower tomorrow - Do not submerge for 7 days - Routine care - 3 hours observation   Signed,  Dulcy Fanny. Earleen Newport, DO

## 2017-05-12 ENCOUNTER — Encounter (HOSPITAL_COMMUNITY): Payer: Self-pay

## 2017-10-29 IMAGING — US US BIOPSY
1 series · 9 of 9 positions shown · non-contrast
Comparison: none

INDICATION: 71-year-old female with a history of chronic renal disease

[Series 1: us biopsy · 0.20mm/px · 9 of 9 slices shown]
[im 1/9]
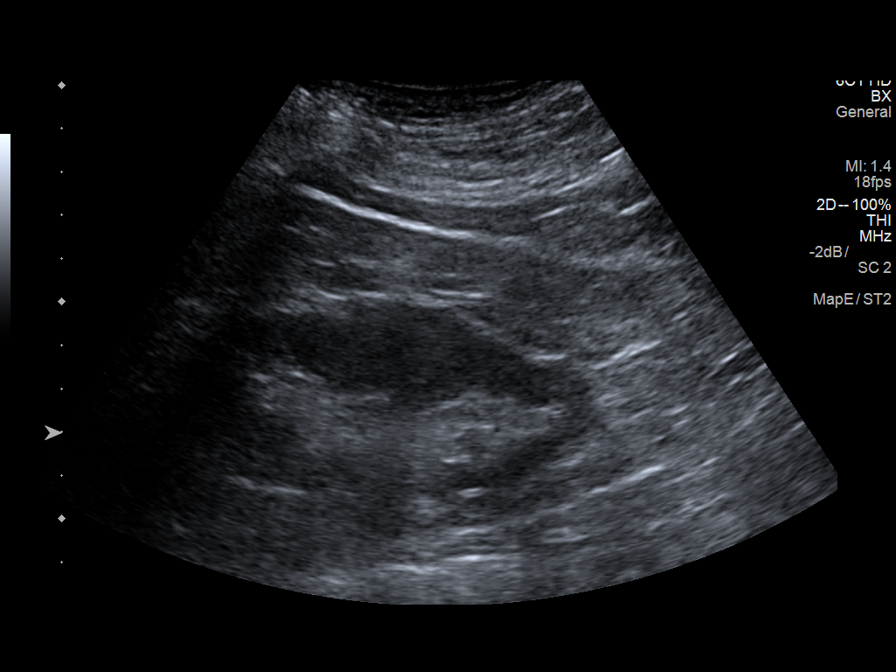
[im 2/9]
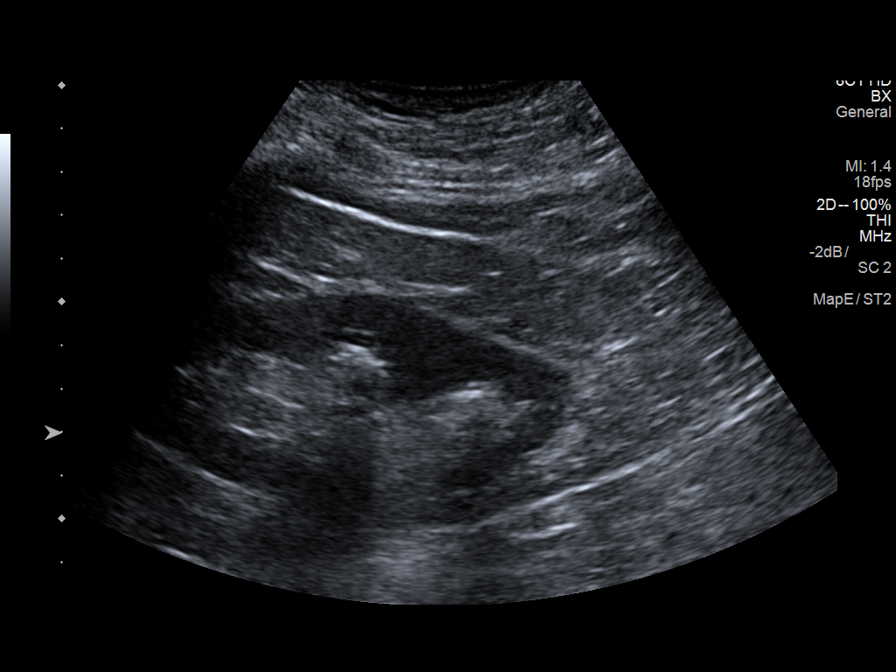
[im 3/9]
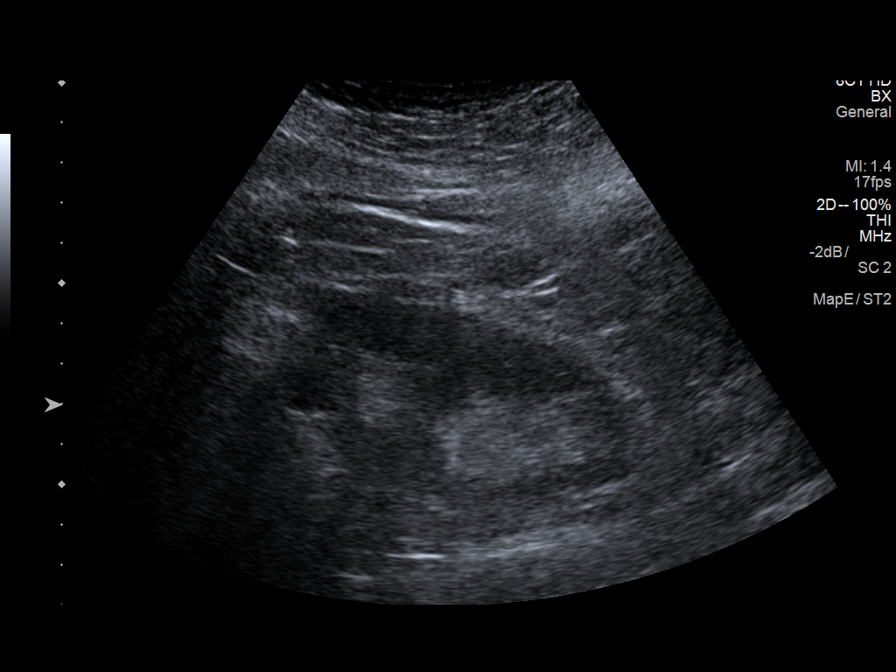
[im 4/9]
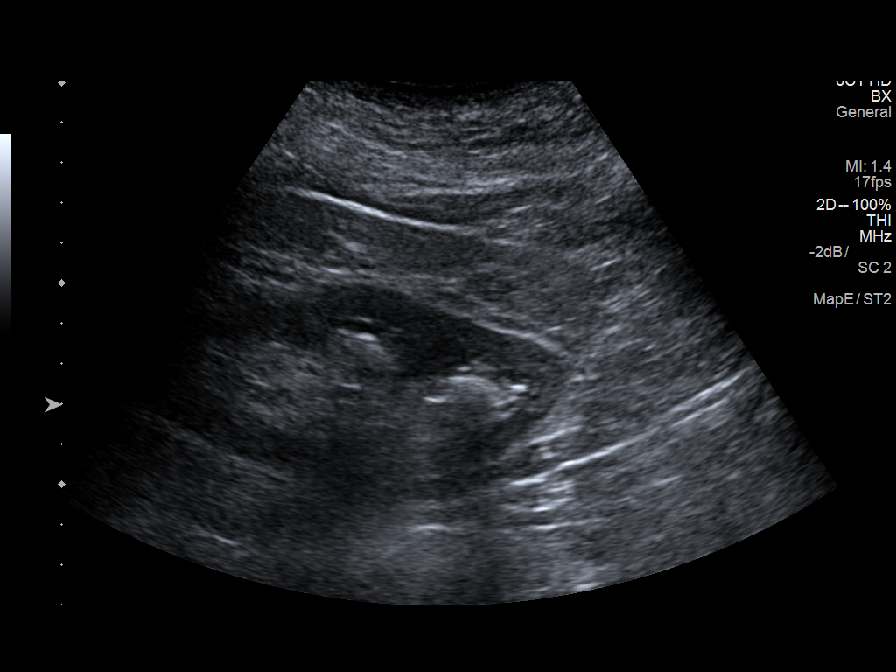
[im 5/9]
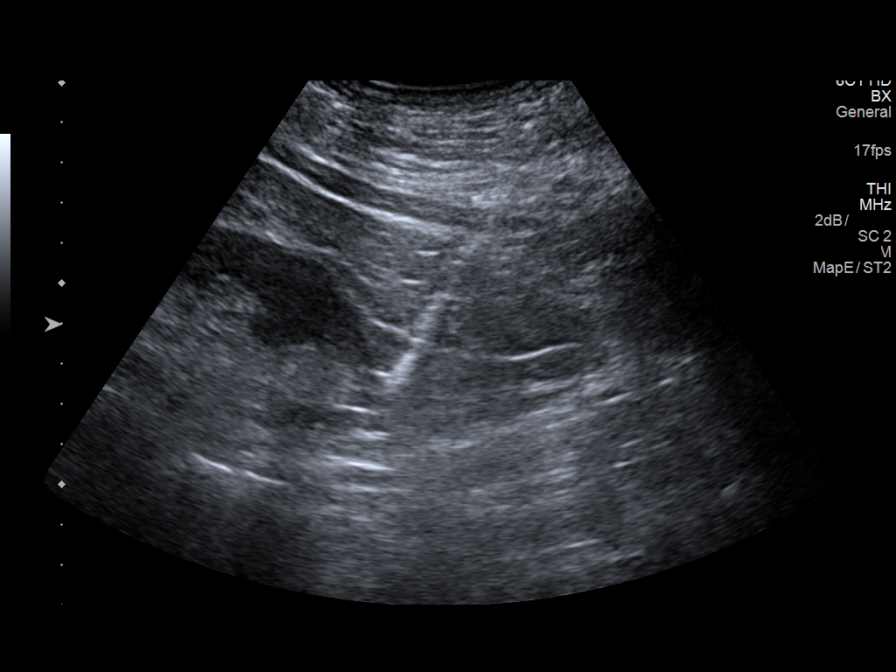
[im 6/9]
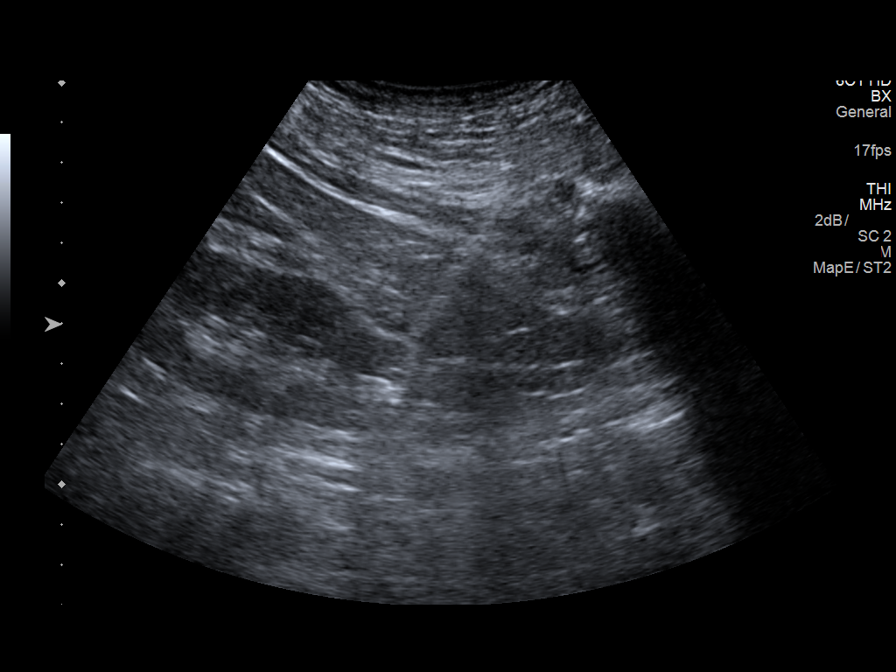
[im 7/9]
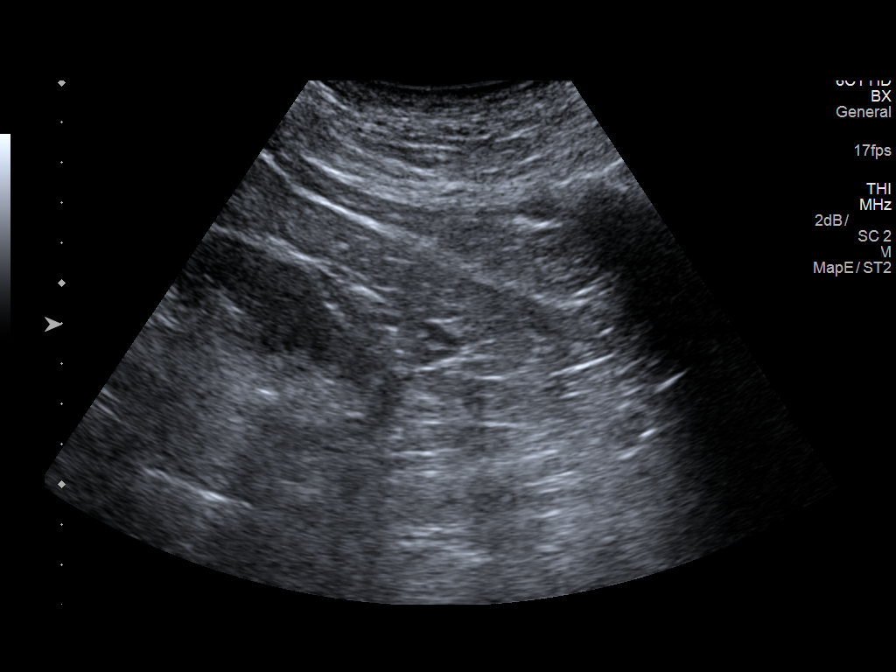
[im 8/9]
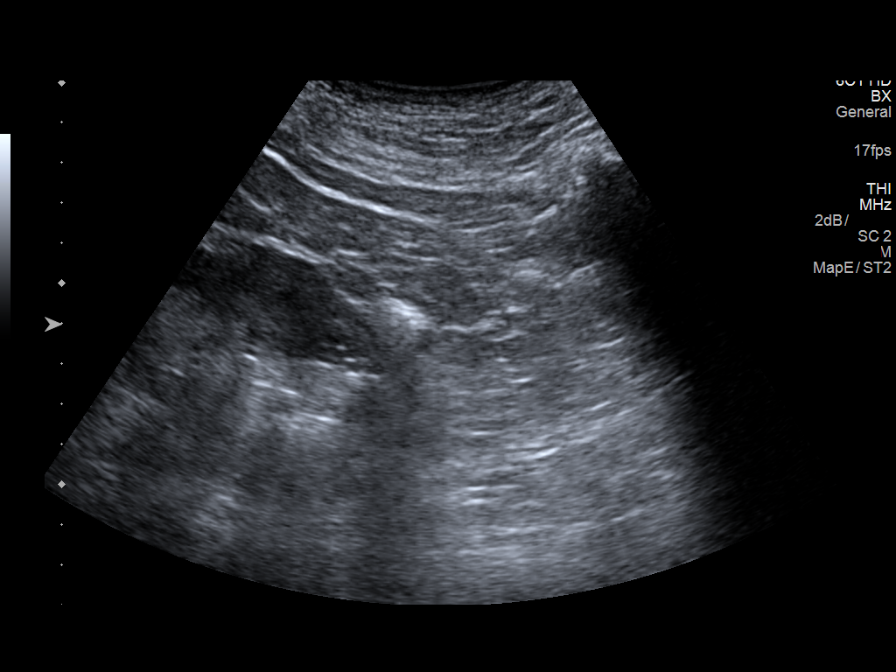
[im 9/9]
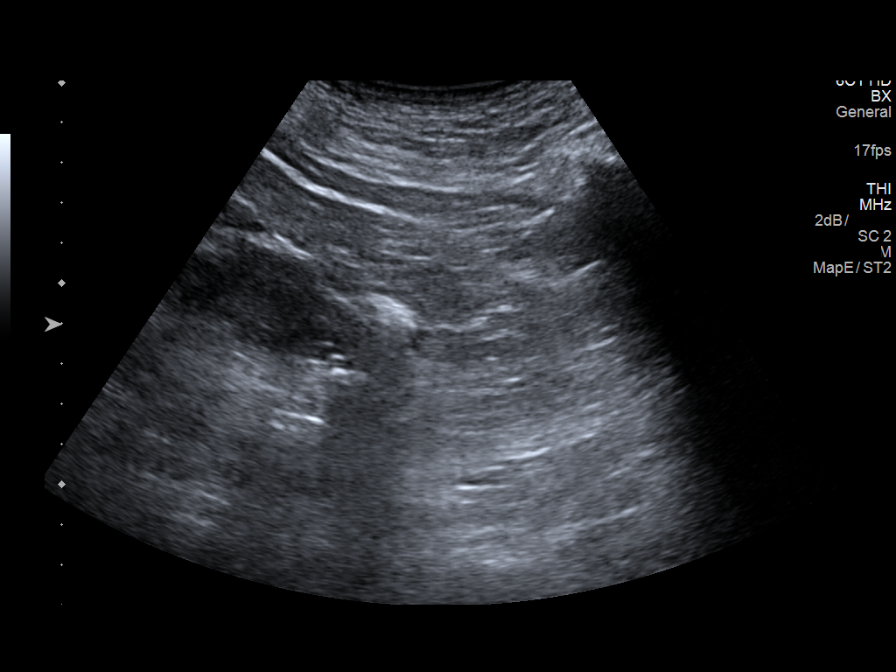

[9 of 9 positions shown; findings below may reference images not displayed]

EXAM:
ULTRASOUND-GUIDED MEDICAL RENAL BIOPSY

MEDICATIONS:
None.

ANESTHESIA/SEDATION:
Moderate (conscious) sedation was employed during this procedure. A
total of Versed 1.0 mg and Fentanyl 25 mcg was administered
intravenously.

Moderate Sedation Time: 12 minutes. The patient's level of
consciousness and vital signs were monitored continuously by
radiology nursing throughout the procedure under my direct
supervision.

FLUOROSCOPY TIME:  None

COMPLICATIONS:
None

PROCEDURE:
Informed written consent was obtained from the patient after a
thorough discussion of the procedural risks, benefits and
alternatives. All questions were addressed. Maximal Sterile Barrier
Technique was utilized including caps, mask, sterile gowns, sterile
gloves, sterile drape, hand hygiene and skin antiseptic. A timeout
was performed prior to the initiation of the procedure.

Patient was positioned prone position on the gantry table. Images
were stored sent to PACs.

Once the patient is prepped and draped in the usual sterile fashion,
the skin and subcutaneous tissues overlying the left kidney were
generously infiltrated 1% lidocaine for local anesthesia.

Using ultrasound guidance, a 15 gauge guide needle was advanced into
the lower cortex of the left kidney.

Once we confirmed location of the needle tip,3 separate 16 gauge
core biopsy were achieved.

Two Gel-Foam pledgets were infused with a small amount of saline.
The needle was removed.

Final images were stored.

The patient tolerated the procedure well and remained
hemodynamically stable throughout.

No complications were encountered and no significant blood loss
encountered.
IMPRESSION: Status post ultrasound-guided medical renal biopsy.

## 2018-01-20 ENCOUNTER — Encounter: Payer: Self-pay | Admitting: Gastroenterology

## 2018-02-24 ENCOUNTER — Encounter: Payer: Self-pay | Admitting: Gastroenterology

## 2018-03-29 ENCOUNTER — Other Ambulatory Visit: Payer: Self-pay

## 2018-03-29 ENCOUNTER — Telehealth: Payer: Self-pay | Admitting: *Deleted

## 2018-03-29 DIAGNOSIS — T884XXA Failed or difficult intubation, initial encounter: Secondary | ICD-10-CM | POA: Insufficient documentation

## 2018-03-29 DIAGNOSIS — Z86010 Personal history of colonic polyps: Secondary | ICD-10-CM

## 2018-03-29 NOTE — Telephone Encounter (Signed)
Patty,  Can you please scheduled this at [facility]. Thanks so much, Lelan Pons

## 2018-03-29 NOTE — Telephone Encounter (Signed)
Called pt- made her aware of the change to [facility] and the reason- Pt was given new date and time, location and instructed to keep her 1-14 PV. Pt verbalized  understanding   Lelan Pons

## 2018-03-29 NOTE — Telephone Encounter (Signed)
Thanks.  Please cancel her upcoming [place] colonoscopy.  She should be done at the hospital instead due to difficult intubation previously.  Please work with Chong Sicilian to find my first available Thursday MAC appointment.  I will include her in this phone note.

## 2018-03-29 NOTE — Telephone Encounter (Signed)
Dr Ardis Hughs,  This pt is scheduled for a colon with you in the [place] 1-28, Tuesday .  In her chart under surgeries in 2014 it was noted she was a difficult intubation due to small mouth opening and limited mouth opening .   Her last colon was in 2009, she had an inflamed HPP x 1.    Do you want her to have an OV or direct at the hospital?  Please advise and thanks for your time, Lelan Pons

## 2018-03-29 NOTE — Telephone Encounter (Signed)
The pt has been scheduled for 04/22/18 1045 am at [facility].  Hospital orders have been added.

## 2018-04-06 ENCOUNTER — Ambulatory Visit (AMBULATORY_SURGERY_CENTER): Payer: Self-pay | Admitting: *Deleted

## 2018-04-06 VITALS — Ht 64.0 in | Wt 165.0 lb

## 2018-04-06 DIAGNOSIS — Z1211 Encounter for screening for malignant neoplasm of colon: Secondary | ICD-10-CM

## 2018-04-06 MED ORDER — PEG 3350-KCL-NA BICARB-NACL 420 G PO SOLR: 4000.0000 mL | Freq: Once | ORAL | 0 refills | Status: AC

## 2018-04-06 NOTE — Progress Notes (Signed)
No egg or soy allergy known to patient  No issues with past sedation with any surgeries or procedures, difficult  Intubation- pt has a small mouth and small mouth opening-  Limited mouth opening  No diet pills per patient No home 02 use per patient  No blood thinners per patient  Pt denies issues with constipation currently- she states she has occ issues but not chronic  No A fib or A flutter  EMMI video sent to pt's e mail - pt declined  Pt needs to RS colon date- Per Sydnee Cabal RN will be 2-27 Thursday at 730 am at [facility]

## 2018-04-20 ENCOUNTER — Encounter: Payer: Medicare Other | Admitting: Gastroenterology

## 2018-05-18 ENCOUNTER — Telehealth: Payer: Self-pay | Admitting: Gastroenterology

## 2018-05-18 NOTE — Telephone Encounter (Signed)
Spoke to patient. She was instructed on how to take her medication prior to her procedure. Patient voiced understanding.

## 2018-05-19 ENCOUNTER — Other Ambulatory Visit: Payer: Self-pay

## 2018-05-19 ENCOUNTER — Encounter (HOSPITAL_COMMUNITY): Payer: Self-pay | Admitting: Emergency Medicine

## 2018-05-19 NOTE — Anesthesia Preprocedure Evaluation (Addendum)
Anesthesia Evaluation  Patient identified by MRN, date of birth, ID band Patient awake    Reviewed: Allergy & Precautions, NPO status , Patient's Chart, lab work & pertinent test results  History of Anesthesia Complications (+) DIFFICULT AIRWAY  Airway Mallampati: IV  TM Distance: <3 FB Neck ROM: Limited  Mouth opening: Limited Mouth Opening  Dental  (+) Dental Advisory Given   Pulmonary neg pulmonary ROS,    breath sounds clear to auscultation       Cardiovascular hypertension, Pt. on medications and Pt. on home beta blockers  Rhythm:Regular Rate:Normal     Neuro/Psych negative neurological ROS     GI/Hepatic negative GI ROS, Neg liver ROS,   Endo/Other  diabetes, Type 2, Oral Hypoglycemic Agents  Renal/GU Renal disease     Musculoskeletal  (+) Arthritis ,   Abdominal   Peds  Hematology negative hematology ROS (+)   Anesthesia Other Findings   Reproductive/Obstetrics                            Anesthesia Physical Anesthesia Plan  ASA: II  Anesthesia Plan: MAC   Post-op Pain Management:    Induction: Intravenous  PONV Risk Score and Plan: 2 and Propofol infusion, Ondansetron and Treatment may vary due to age or medical condition  Airway Management Planned: Natural Airway and Simple Face Mask  Additional Equipment:   Intra-op Plan:   Post-operative Plan:   Informed Consent: I have reviewed the patients History and Physical, chart, labs and discussed the procedure including the risks, benefits and alternatives for the proposed anesthesia with the patient or authorized representative who has indicated his/her understanding and acceptance.     Dental advisory given  Plan Discussed with: CRNA  Anesthesia Plan Comments:        Anesthesia Quick Evaluation

## 2018-05-20 ENCOUNTER — Ambulatory Visit (HOSPITAL_COMMUNITY): Payer: Medicare Other | Admitting: Anesthesiology

## 2018-05-20 ENCOUNTER — Encounter (HOSPITAL_COMMUNITY)
Admission: RE | Disposition: A | Discharge status: Home / Self Care | Payer: Self-pay | Source: Home / Self Care | Attending: Gastroenterology

## 2018-05-20 ENCOUNTER — Ambulatory Visit (HOSPITAL_COMMUNITY)
Admission: RE | Admit: 2018-05-20 | Discharge: 2018-05-20 | Disposition: A | Discharge status: Home / Self Care | Payer: Medicare Other | Attending: Gastroenterology | Admitting: Gastroenterology

## 2018-05-20 ENCOUNTER — Encounter (HOSPITAL_COMMUNITY): Payer: Self-pay | Admitting: *Deleted

## 2018-05-20 DIAGNOSIS — D123 Benign neoplasm of transverse colon: Secondary | ICD-10-CM | POA: Diagnosis not present

## 2018-05-20 DIAGNOSIS — Z7984 Long term (current) use of oral hypoglycemic drugs: Secondary | ICD-10-CM | POA: Insufficient documentation

## 2018-05-20 DIAGNOSIS — Z1211 Encounter for screening for malignant neoplasm of colon: Secondary | ICD-10-CM | POA: Diagnosis not present

## 2018-05-20 DIAGNOSIS — I129 Hypertensive chronic kidney disease with stage 1 through stage 4 chronic kidney disease, or unspecified chronic kidney disease: Secondary | ICD-10-CM | POA: Diagnosis not present

## 2018-05-20 DIAGNOSIS — Z860100 Personal history of colon polyps, unspecified: Secondary | ICD-10-CM

## 2018-05-20 DIAGNOSIS — K635 Polyp of colon: Secondary | ICD-10-CM | POA: Diagnosis not present

## 2018-05-20 DIAGNOSIS — N183 Chronic kidney disease, stage 3 (moderate): Secondary | ICD-10-CM | POA: Insufficient documentation

## 2018-05-20 DIAGNOSIS — M1990 Unspecified osteoarthritis, unspecified site: Secondary | ICD-10-CM | POA: Diagnosis not present

## 2018-05-20 DIAGNOSIS — E1122 Type 2 diabetes mellitus with diabetic chronic kidney disease: Secondary | ICD-10-CM | POA: Insufficient documentation

## 2018-05-20 DIAGNOSIS — Z79899 Other long term (current) drug therapy: Secondary | ICD-10-CM | POA: Insufficient documentation

## 2018-05-20 DIAGNOSIS — K5730 Diverticulosis of large intestine without perforation or abscess without bleeding: Secondary | ICD-10-CM | POA: Diagnosis not present

## 2018-05-20 HISTORY — PX: POLYPECTOMY: SHX5525

## 2018-05-20 HISTORY — PX: COLONOSCOPY WITH PROPOFOL: SHX5780

## 2018-05-20 LAB — GLUCOSE, CAPILLARY: Glucose-Capillary: 119 mg/dL — ABNORMAL HIGH (ref 70–99)

## 2018-05-20 SURGERY — COLONOSCOPY WITH PROPOFOL
Anesthesia: Monitor Anesthesia Care

## 2018-05-20 MED ORDER — PROPOFOL 10 MG/ML IV BOLUS
INTRAVENOUS | Status: AC
  Filled 2018-05-20: qty 60

## 2018-05-20 MED ORDER — SODIUM CHLORIDE 0.9 % IV SOLN: INTRAVENOUS | Status: DC

## 2018-05-20 MED ORDER — PROPOFOL 500 MG/50ML IV EMUL
INTRAVENOUS | Status: DC | PRN
  Administered 2018-05-20: 75 ug/kg/min via INTRAVENOUS

## 2018-05-20 MED ORDER — LACTATED RINGERS IV SOLN
INTRAVENOUS | Status: DC
  Administered 2018-05-20: 1000 mL via INTRAVENOUS

## 2018-05-20 MED ORDER — PROPOFOL 10 MG/ML IV BOLUS
INTRAVENOUS | Status: DC | PRN
  Administered 2018-05-20: 20 mg via INTRAVENOUS

## 2018-05-20 MED ORDER — LIDOCAINE 2% (20 MG/ML) 5 ML SYRINGE
INTRAMUSCULAR | Status: DC | PRN
  Administered 2018-05-20: 40 mg via INTRAVENOUS

## 2018-05-20 SURGICAL SUPPLY — 22 items

## 2018-05-20 NOTE — H&P (Signed)
HPI: This is a 73 yo woman  Chief complaint is routine risk for colon cancer  ROS: complete GI ROS as described in HPI, all other review negative.  Constitutional:  No unintentional weight loss   Past Medical History:  Diagnosis Date  . Allergy   . Anemia   . Arthritis   . Cataract    biltaerally   . CKD (chronic kidney disease) stage 3, GFR 30-59 ml/min (HCC) 08/29/2011   per patient, kidneys monitored by Dr Posey Pronto endocrinologist ,lov was  05-18-2018, "kidney fx stable"  . Complication of anesthesia   . Difficult intubation    small mouth opening  . Gout   . HTN (hypertension) 08/29/2011  . Hyperlipemia   . Hypertension   . Psoriasis   . Type II or unspecified type diabetes mellitus without mention of complication, not stated as uncontrolled 08/29/2011    Past Surgical History:  Procedure Laterality Date  . BREAST LUMPECTOMY WITH NEEDLE LOCALIZATION Right 05/07/2012   Procedure: BREAST LUMPECTOMY WITH NEEDLE LOCALIZATION;  Surgeon: Merrie Roof, MD;  Location: [place];  Service: General;  Laterality: Right;  . CHOLECYSTECTOMY    . COLONOSCOPY  2009   Ardis Hughs   . HERNIA REPAIR    . HIP SURGERY    . RHINOPLASTY    . SALPINGOOPHORECTOMY    . TOTAL HIP ARTHROPLASTY Right 2004    Current Facility-Administered Medications  Medication Dose Route Frequency Provider Last Rate Last Dose  . 0.9 %  sodium chloride infusion   Intravenous Continuous Milus Banister, MD      . lactated ringers infusion   Intravenous Continuous Milus Banister, MD 10 mL/hr at 05/20/18 0647 1,000 mL at 05/20/18 3299    Allergies as of 03/29/2018 - Review Complete 05/01/2017  Allergen Reaction Noted  . Codeine  12/17/2007  . Iodinated diagnostic agents  06/19/2015  . Pentazocine  06/19/2015  . Pentazocine lactate  12/17/2007  . Statins  06/19/2015    Family History  Problem Relation Age of Onset  . Heart disease Father   . Arthritis Sister   . Colon cancer Neg Hx   . Colon polyps Neg Hx    . Esophageal cancer Neg Hx   . Stomach cancer Neg Hx   . Rectal cancer Neg Hx     Social History   Socioeconomic History  . Marital status: Married    Spouse name: Not on file  . Number of children: Not on file  . Years of education: Not on file  . Highest education level: Not on file  Occupational History  . Not on file  Social Needs  . Financial resource strain: Not on file  . Food insecurity:    Worry: Not on file    Inability: Not on file  . Transportation needs:    Medical: Not on file    Non-medical: Not on file  Tobacco Use  . Smoking status: Never Smoker  . Smokeless tobacco: Never Used  Substance and Sexual Activity  . Alcohol use: Yes    Comment: Drinks on Special Occasions  . Drug use: No  . Sexual activity: Never  Lifestyle  . Physical activity:    Days per week: Not on file    Minutes per session: Not on file  . Stress: Not on file  Relationships  . Social connections:    Talks on phone: Not on file    Gets together: Not on file    Attends religious service: Not  on file    Active member of club or organization: Not on file    Attends meetings of clubs or organizations: Not on file    Relationship status: Not on file  . Intimate partner violence:    Fear of current or ex partner: Not on file    Emotionally abused: Not on file    Physically abused: Not on file    Forced sexual activity: Not on file  Other Topics Concern  . Not on file  Social History Narrative  . Not on file     Physical Exam: BP (!) 122/58   Pulse 71   Temp 97.8 F (36.6 C) (Oral)   Resp 18   Ht 5\' 4"  (1.626 m)   Wt 74.8 kg   SpO2 97%   BMI 28.32 kg/m  Constitutional: generally well-appearing Psychiatric: alert and oriented x3 Abdomen: soft, nontender, nondistended, no obvious ascites, no peritoneal signs, normal bowel sounds No peripheral edema noted in lower extremities  Assessment and plan: 73 y.o. female with routine risk for colon cancer  Here for colon  cancer screening with colonoscopy (last screening was 2009)  Please see the "Patient Instructions" section for addition details about the plan.  Owens Loffler, MD [facility] 05/20/2018, 7:10 AM

## 2018-05-20 NOTE — Anesthesia Procedure Notes (Signed)
Date/Time: 05/20/2018 7:21 AM Performed by: Talbot Grumbling, CRNA Oxygen Delivery Method: Simple face mask

## 2018-05-20 NOTE — Transfer of Care (Signed)
Immediate Anesthesia Transfer of Care Note  Patient: Jacqueline Richmond  Procedure(s) Performed: COLONOSCOPY WITH PROPOFOL (N/A ) POLYPECTOMY  Patient Location: PACU  Anesthesia Type:MAC  Level of Consciousness: sedated  Airway & Oxygen Therapy: Patient Spontanous Breathing and Patient connected to face mask oxygen  Post-op Assessment: Report given to RN and Post -op Vital signs reviewed and stable  Post vital signs: Reviewed and stable  Last Vitals:  Vitals Value Taken Time  BP 112/61 05/20/2018  7:45 AM  Temp    Pulse 65 05/20/2018  7:45 AM  Resp 18 05/20/2018  7:45 AM  SpO2 100 % 05/20/2018  7:45 AM  Vitals shown include unvalidated device data.  Last Pain:  Vitals:   05/20/18 0635  TempSrc: Oral  PainSc: 0-No pain         Complications: No apparent anesthesia complications

## 2018-05-20 NOTE — Anesthesia Postprocedure Evaluation (Signed)
Anesthesia Post Note  Patient: Jacqueline Richmond  Procedure(s) Performed: COLONOSCOPY WITH PROPOFOL (N/A ) POLYPECTOMY     Patient location during evaluation: PACU Anesthesia Type: MAC Level of consciousness: awake and alert Pain management: pain level controlled Vital Signs Assessment: post-procedure vital signs reviewed and stable Respiratory status: spontaneous breathing, nonlabored ventilation, respiratory function stable and patient connected to nasal cannula oxygen Cardiovascular status: stable and blood pressure returned to baseline Postop Assessment: no apparent nausea or vomiting Anesthetic complications: no    Last Vitals:  Vitals:   05/20/18 0750 05/20/18 0800  BP: (!) 109/57 (!) 114/55  Pulse: 63 64  Resp: 15 18  Temp:    SpO2: 100% 96%    Last Pain:  Vitals:   05/20/18 0800  TempSrc:   PainSc: 0-No pain                 Tiajuana Amass

## 2018-05-20 NOTE — Op Note (Signed)
[facility] Patient Name: Jacqueline Richmond Procedure Date: 05/20/2018 MRN: [id number] Attending MD: Milus Banister , MD Date of Birth: 04-23-1945 CSN: [id number] Age: 73 Admit Type: Outpatient Procedure:                Colonoscopy Indications:              Screening for colorectal malignant neoplasm Providers:                Milus Banister, MD, Cleda Daub, RN, Tinnie Gens, Technician, Marla Roe, CRNA Referring MD:              Medicines:                Monitored Anesthesia Care Complications:            No immediate complications. Estimated blood loss:                            None. Estimated Blood Loss:     Estimated blood loss: none. Procedure:                Pre-Anesthesia Assessment:                           - Prior to the procedure, a History and Physical                            was performed, and patient medications and                            allergies were reviewed. The patient's tolerance of                            previous anesthesia was also reviewed. The risks                            and benefits of the procedure and the sedation                            options and risks were discussed with the patient.                            All questions were answered, and informed consent                            was obtained. Prior Anticoagulants: The patient has                            taken no previous anticoagulant or antiplatelet                            agents. ASA Grade Assessment: II - A patient with  mild systemic disease. After reviewing the risks                            and benefits, the patient was deemed in                            satisfactory condition to undergo the procedure.                           After obtaining informed consent, the colonoscope                            was passed under direct vision. Throughout the                            procedure, the  patient's blood pressure, pulse, and                            oxygen saturations were monitored continuously. The                            CF-HQ190L ([id number] Olympus colonoscope was                            introduced through the anus and advanced to the the                            cecum, identified by appendiceal orifice and                            ileocecal valve. The ileocecal valve, appendiceal                            orifice, and rectum were photographed. Scope In: 7:27:09 AM Scope Out: 7:39:30 AM Scope Withdrawal Time: 0 hours 7 minutes 3 seconds  Total Procedure Duration: 0 hours 12 minutes 21 seconds  Findings:      A 5 mm polyp was found in the transverse colon. The polyp was sessile.       The polyp was removed with a cold snare. Resection and retrieval were       complete.      Multiple small and large-mouthed diverticula were found in the left       colon.      The exam was otherwise without abnormality on direct and retroflexion       views. Impression:               - One 5 mm polyp in the transverse colon, removed                            with a cold snare. Resected and retrieved.                           - Diverticulosis in the left colon.                           -  The examination was otherwise normal on direct                            and retroflexion views. Moderate Sedation:      Not Applicable - Patient had care per Anesthesia. Recommendation:           - Patient has a contact number available for                            emergencies. The signs and symptoms of potential                            delayed complications were discussed with the                            patient. Return to normal activities tomorrow.                            Written discharge instructions were provided to the                            patient.                           - Resume previous diet.                           - Continue present medications.                            You will receive a letter within 2-3 weeks with the                            pathology results and my final recommendations.                           If the polyp(s) is proven to be 'pre-cancerous' on                            pathology, you will need repeat colonoscopy in 7                            years. If the polyp(s) is NOT 'precancerous' on                            pathology then you should repeat colon cancer                            screening in 10 years with colonoscopy without need                            for colon cancer screening by any method prior to  then (including stool testing). Procedure Code(s):        --- Professional ---                           [id number], Colonoscopy, flexible; with removal of                            tumor(s), polyp(s), or other lesion(s) by snare                            technique Diagnosis Code(s):        --- Professional ---                           Z12.11, Encounter for screening for malignant                            neoplasm of colon                           D12.3, Benign neoplasm of transverse colon (hepatic                            flexure or splenic flexure)                           K57.30, Diverticulosis of large intestine without                            perforation or abscess without bleeding CPT copyright 2018 American Medical Association. All rights reserved. The codes documented in this report are preliminary and upon coder review may  be revised to meet current compliance requirements. Milus Banister, MD 05/20/2018 7:42:01 AM This report has been signed electronically. Number of Addenda: 0

## 2018-05-20 NOTE — Discharge Instructions (Signed)

## 2018-05-21 ENCOUNTER — Encounter (HOSPITAL_COMMUNITY): Payer: Self-pay | Admitting: Gastroenterology

## 2018-09-06 ENCOUNTER — Telehealth: Payer: Self-pay | Admitting: Gastroenterology

## 2018-09-06 NOTE — Telephone Encounter (Signed)
Pt called to inquire about results of colonoscopy biopsy.

## 2018-09-06 NOTE — Telephone Encounter (Signed)
The patient has been notified of the pathology information and all questions answered.

## 2018-09-06 NOTE — Telephone Encounter (Signed)
Line busy

## 2018-12-27 ENCOUNTER — Encounter: Payer: Self-pay | Admitting: Gastroenterology

## 2018-12-27 ENCOUNTER — Ambulatory Visit: Payer: Medicare Other | Admitting: Gastroenterology

## 2018-12-27 VITALS — BP 130/62 | HR 76 | Temp 97.6°F | Ht 61.75 in | Wt 169.2 lb

## 2018-12-27 DIAGNOSIS — Z86010 Personal history of colonic polyps: Secondary | ICD-10-CM | POA: Diagnosis not present

## 2018-12-27 NOTE — Patient Instructions (Addendum)
Follow up as needed  If you are age 72 or older, your body mass index should be between 23-30. Your Body mass index is 31.21 kg/m. If this is out of the aforementioned range listed, please consider follow up with your Primary Care Provider.  If you are age 36 or younger, your body mass index should be between 19-25. Your Body mass index is 31.21 kg/m. If this is out of the aformentioned range listed, please consider follow up with your Primary Care Provider.    I appreciate the  opportunity to care for you  Thank You   Owens Loffler , MD

## 2018-12-27 NOTE — Progress Notes (Signed)
Review of pertinent gastrointestinal problems: 1.  Routine risk for colon cancer; colonoscopy 2020 colon cancer screening routine risk.  Left-sided diverticulosis was noted.  A 5 mm polyp was removed from the transverse colon.  The polyp was not precancerous.  I recommended no further colon cancer screening was necessary given her age at that time was 21    HPI: This is a very pleasant 73 year old woman  I last saw her late February 2020 at the time of colonoscopy.  See that results summarized above.  Polyp was not precancerous and I recommended no further colon cancer screening.  The results were were communicated to her over the phone, documented May 24, 2018.  Today she tells me that 4 or 5 weeks ago she was in the shower and something dropped to the floor.  She examined it and saved it and brought it and now, for 5 weeks afterwards.  She showed me a Ziploc bag containing several wet pieces of white gauze, tissue of some sort.  There was no blood there.  We had a discussion about this.  She feels fairly certain that this was left inside of her during the colonoscopy.  I explained to her that I thought that was very unlikely for her to have been inadvertently inserted into her anus and also for it to then remain there for 5 months before being expelled during the shower.  I told her that if this foreign body, tissue was indeed inside of her somewhere I am glad it is outside of her now.  She is concerned about the possibility of an infection.  I told her I think that if this foreign body was indeed inside of her that it could have caused several different problems but since it is obviously not inside of her any longer and it has not been inside of her since at least when she she saw it on her shower floor for 5 weeks ago I think it is highly unlikely that she will have any complications from it now.  Chief complaint is foreign body in rectum?  ROS: complete GI ROS as described in HPI, all  other review negative.  Constitutional:  No unintentional weight loss   Past Medical History:  Diagnosis Date  . Allergy   . Anemia   . Arthritis   . Cataract    biltaerally   . CKD (chronic kidney disease) stage 3, GFR 30-59 ml/min 08/29/2011   per patient, kidneys monitored by Dr Posey Pronto endocrinologist ,lov was  05-18-2018, "kidney fx stable"  . Colon polyp   . Complication of anesthesia   . Difficult intubation    small mouth opening  . Diverticulosis   . Gout   . HTN (hypertension) 08/29/2011  . Hyperlipemia   . Hypertension   . Psoriasis   . Type II or unspecified type diabetes mellitus without mention of complication, not stated as uncontrolled 08/29/2011    Past Surgical History:  Procedure Laterality Date  . BREAST LUMPECTOMY WITH NEEDLE LOCALIZATION Right 05/07/2012   Procedure: BREAST LUMPECTOMY WITH NEEDLE LOCALIZATION;  Surgeon: Merrie Roof, MD;  Location: [facility];  Service: General;  Laterality: Right;  . CHOLECYSTECTOMY    . COLONOSCOPY  2009   Ardis Hughs   . COLONOSCOPY WITH PROPOFOL N/A 05/20/2018   Procedure: COLONOSCOPY WITH PROPOFOL;  Surgeon: Milus Banister, MD;  Location: [facility];  Service: Endoscopy;  Laterality: N/A;  . HERNIA REPAIR    . HIP SURGERY    .  POLYPECTOMY  05/20/2018   Procedure: POLYPECTOMY;  Surgeon: Milus Banister, MD;  Location: [facility];  Service: Endoscopy;;  . RHINOPLASTY    . SALPINGOOPHORECTOMY    . TOTAL HIP ARTHROPLASTY Right 2004    Current Outpatient Medications  Medication Sig Dispense Refill  . allopurinol (ZYLOPRIM) 300 MG tablet Take 300 mg by mouth daily.     Marland Kitchen aspirin EC 81 MG tablet Take 81 mg by mouth daily. Reported on 06/19/2015    . atenolol (TENORMIN) 100 MG tablet Take 100 mg by mouth daily.    Marland Kitchen b complex vitamins tablet Take 1 tablet by mouth daily.    Marland Kitchen CINNAMON PO Take 2,000 mg by mouth 2 (two) times daily. Reported on 06/19/2015    . cloNIDine (CATAPRES) 0.3 MG tablet Take 0.3 mg by mouth 2 (two) times  daily.    . fenofibrate 54 MG tablet Take 1 tablet (54 mg total) by mouth at bedtime. 30 tablet 0  . furosemide (LASIX) 40 MG tablet Take 40 mg by mouth daily.     . Garlic 8786 MG CAPS Take 2,000 mg by mouth 2 (two) times daily.    Marland Kitchen glipiZIDE (GLUCOTROL XL) 10 MG 24 hr tablet Take 10 mg by mouth daily with breakfast.    . loratadine (CLARITIN) 10 MG tablet Take 10 mg by mouth daily.    . mometasone (ELOCON) 0.1 % cream Apply 1 application topically daily as needed (psoriasis).     . niacin 500 MG tablet Take 500 mg by mouth 2 (two) times daily with a meal. Reported on 06/19/2015    . Omega-3 1000 MG CAPS Take 1,000 mg by mouth 2 (two) times daily.    . sitaGLIPtin (JANUVIA) 50 MG tablet Take 50 mg by mouth daily.      No current facility-administered medications for this visit.     Allergies as of 12/27/2018 - Review Complete 12/27/2018  Allergen Reaction Noted  . Codeine Itching 12/17/2007  . Iodinated diagnostic agents  06/19/2015  . Pentazocine Other (See Comments) 06/19/2015  . Statins  06/19/2015    Family History  Problem Relation Age of Onset  . Heart disease Father   . Arthritis Sister   . Colon cancer Neg Hx   . Colon polyps Neg Hx   . Esophageal cancer Neg Hx   . Stomach cancer Neg Hx   . Rectal cancer Neg Hx     Social History   Socioeconomic History  . Marital status: Married    Spouse name: Not on file  . Number of children: Not on file  . Years of education: Not on file  . Highest education level: Not on file  Occupational History  . Not on file  Social Needs  . Financial resource strain: Not on file  . Food insecurity    Worry: Not on file    Inability: Not on file  . Transportation needs    Medical: Not on file    Non-medical: Not on file  Tobacco Use  . Smoking status: Never Smoker  . Smokeless tobacco: Never Used  Substance and Sexual Activity  . Alcohol use: Yes    Comment: Drinks on Special Occasions  . Drug use: No  . Sexual activity:  Never  Lifestyle  . Physical activity    Days per week: Not on file    Minutes per session: Not on file  . Stress: Not on file  Relationships  . Social connections    Talks  on phone: Not on file    Gets together: Not on file    Attends religious service: Not on file    Active member of club or organization: Not on file    Attends meetings of clubs or organizations: Not on file    Relationship status: Not on file  . Intimate partner violence    Fear of current or ex partner: Not on file    Emotionally abused: Not on file    Physically abused: Not on file    Forced sexual activity: Not on file  Other Topics Concern  . Not on file  Social History Narrative  . Not on file     Physical Exam: Temp 97.6 F (36.4 C)   Ht 5' 1.75" (1.568 m) Comment: height measured without shoes  Wt 169 lb 4 oz (76.8 kg)   BMI 31.21 kg/m  Constitutional: generally well-appearing Psychiatric: alert and oriented x3 Abdomen: soft, nontender, nondistended, no obvious ascites, no peritoneal signs, normal bowel sounds No peripheral edema noted in lower extremities  Assessment and plan: 73 y.o. female with history of non-precancerous colon polyps  Today she tells me that 4 or 5 weeks ago she was in the shower and something dropped to the floor.  This would have been about 5 months after her colonoscopy.  She examined it and saved it and brought it and now, for 5 weeks afterwards.  She showed me a Ziploc bag containing several wet pieces of white gauze, tissue of some sort.  There was no blood there.  We had a discussion about this.  She feels fairly certain that this was left inside of her during the colonoscopy.  I explained to her that I thought that was very unlikely for this to have been inadvertently inserted into her anus and also for it to then remain there for 5 months before being expelled during the shower.  I told her that if this foreign body, tissue was indeed inside of her somewhere I am  glad it is outside of her now.  She is concerned about the possibility of an infection.  I told her I think that if this foreign body was indeed inside of her that it could have caused several different problems but since it is obviously not inside of her any longer and it has not been inside of her since at least when she she saw it on her shower floor 4- 5 weeks ago I think it is highly unlikely that she will have any complications from it now.  Please see the "Patient Instructions" section for addition details about the plan.  Owens Loffler, MD [facility] 12/27/2018, 1:15 PM

## 2019-04-15 ENCOUNTER — Ambulatory Visit (INDEPENDENT_AMBULATORY_CARE_PROVIDER_SITE_OTHER): Payer: Medicare PPO

## 2019-04-15 ENCOUNTER — Other Ambulatory Visit: Payer: Self-pay

## 2019-04-15 ENCOUNTER — Ambulatory Visit: Payer: Medicare PPO | Admitting: Family Medicine

## 2019-04-15 ENCOUNTER — Encounter: Payer: Self-pay | Admitting: Family Medicine

## 2019-04-15 VITALS — BP 122/80 | HR 71 | Ht 61.75 in | Wt 177.0 lb

## 2019-04-15 DIAGNOSIS — M25512 Pain in left shoulder: Secondary | ICD-10-CM

## 2019-04-15 DIAGNOSIS — G8929 Other chronic pain: Secondary | ICD-10-CM | POA: Diagnosis not present

## 2019-04-15 DIAGNOSIS — M12812 Other specific arthropathies, not elsewhere classified, left shoulder: Secondary | ICD-10-CM

## 2019-04-15 DIAGNOSIS — M25511 Pain in right shoulder: Secondary | ICD-10-CM

## 2019-04-15 DIAGNOSIS — M12811 Other specific arthropathies, not elsewhere classified, right shoulder: Secondary | ICD-10-CM | POA: Diagnosis not present

## 2019-04-15 IMAGING — DX DG SHOULDER 2+V*L*
3 series · 3 of 3 positions shown · non-contrast
Comparison: None.

CLINICAL DATA: Chronic bilateral shoulder pain without known
injury.

EXAM:
LEFT SHOULDER - 2+ VIEW

[shoulder (grashey view) ap]
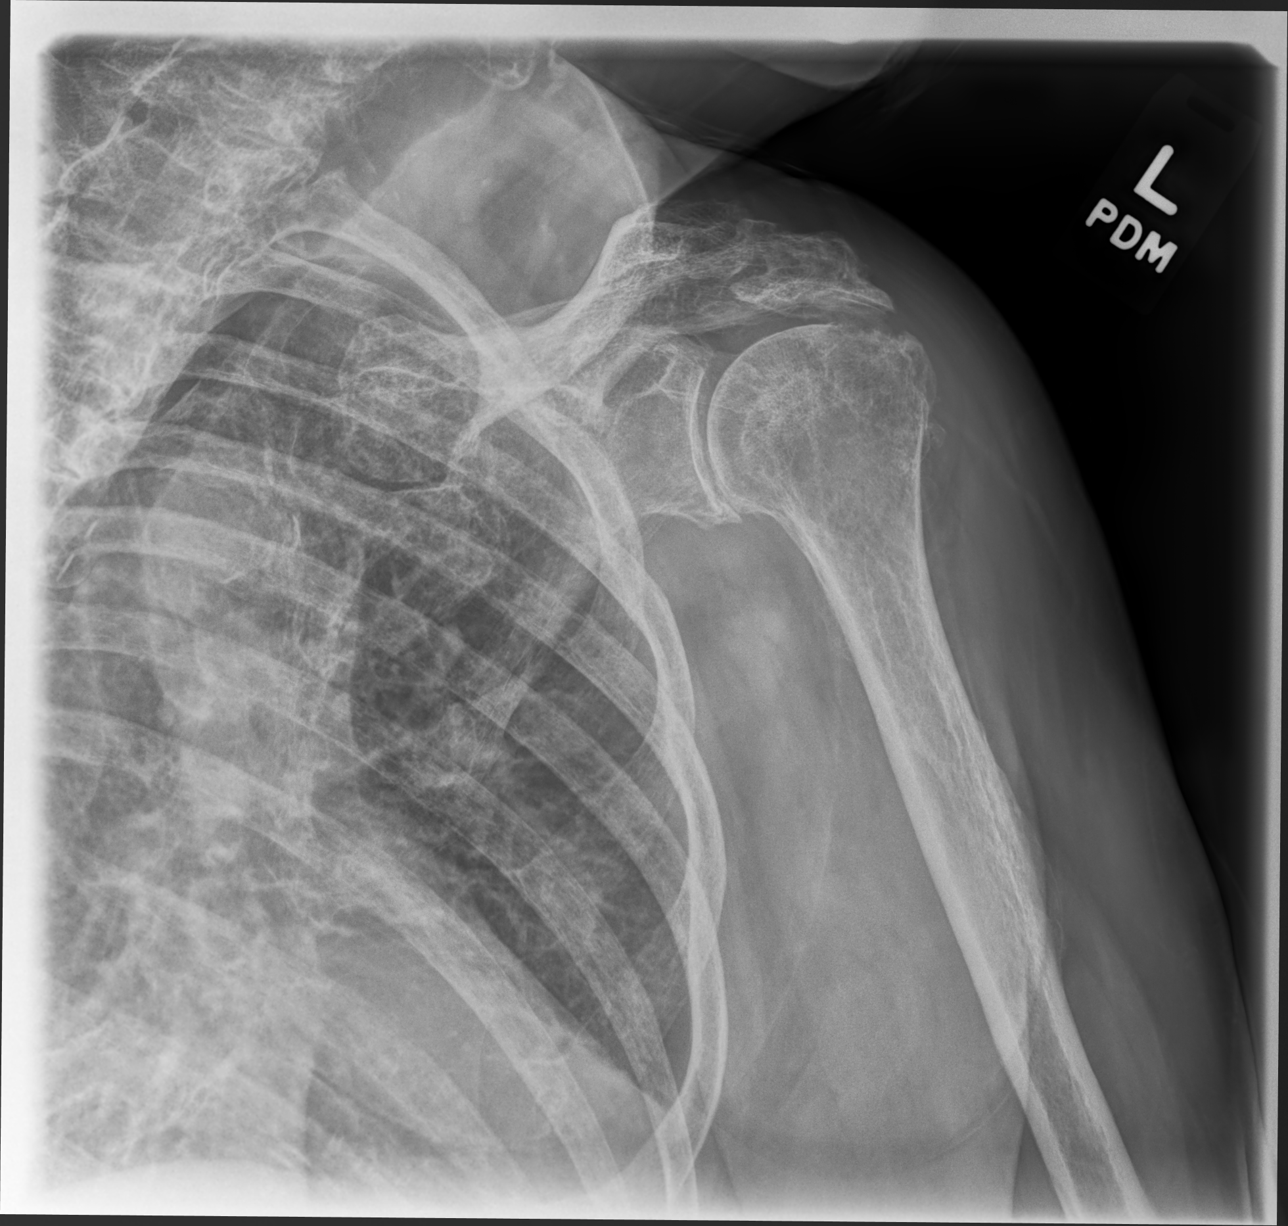

[shoulder (y view) lat]
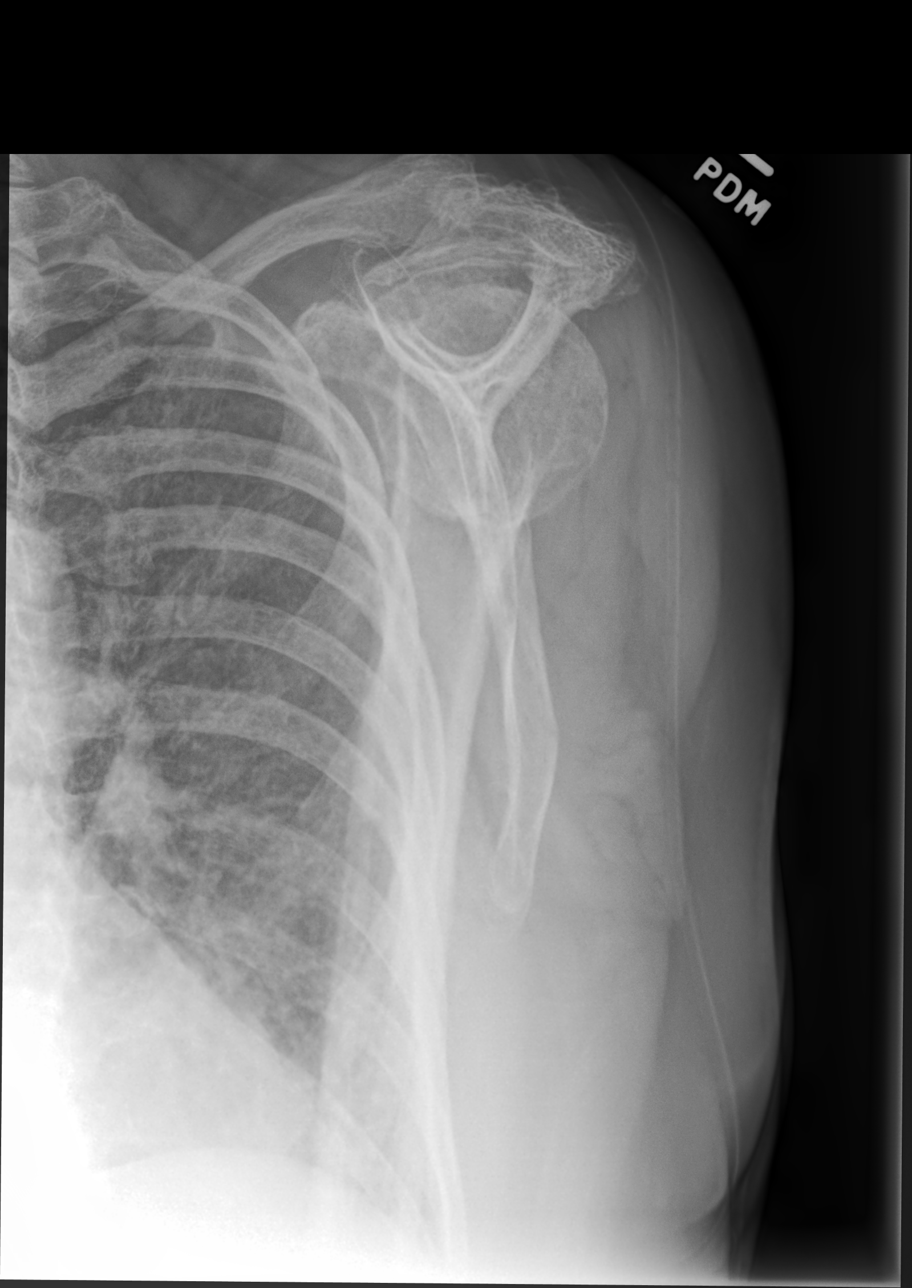

[shoulder axial 45°]
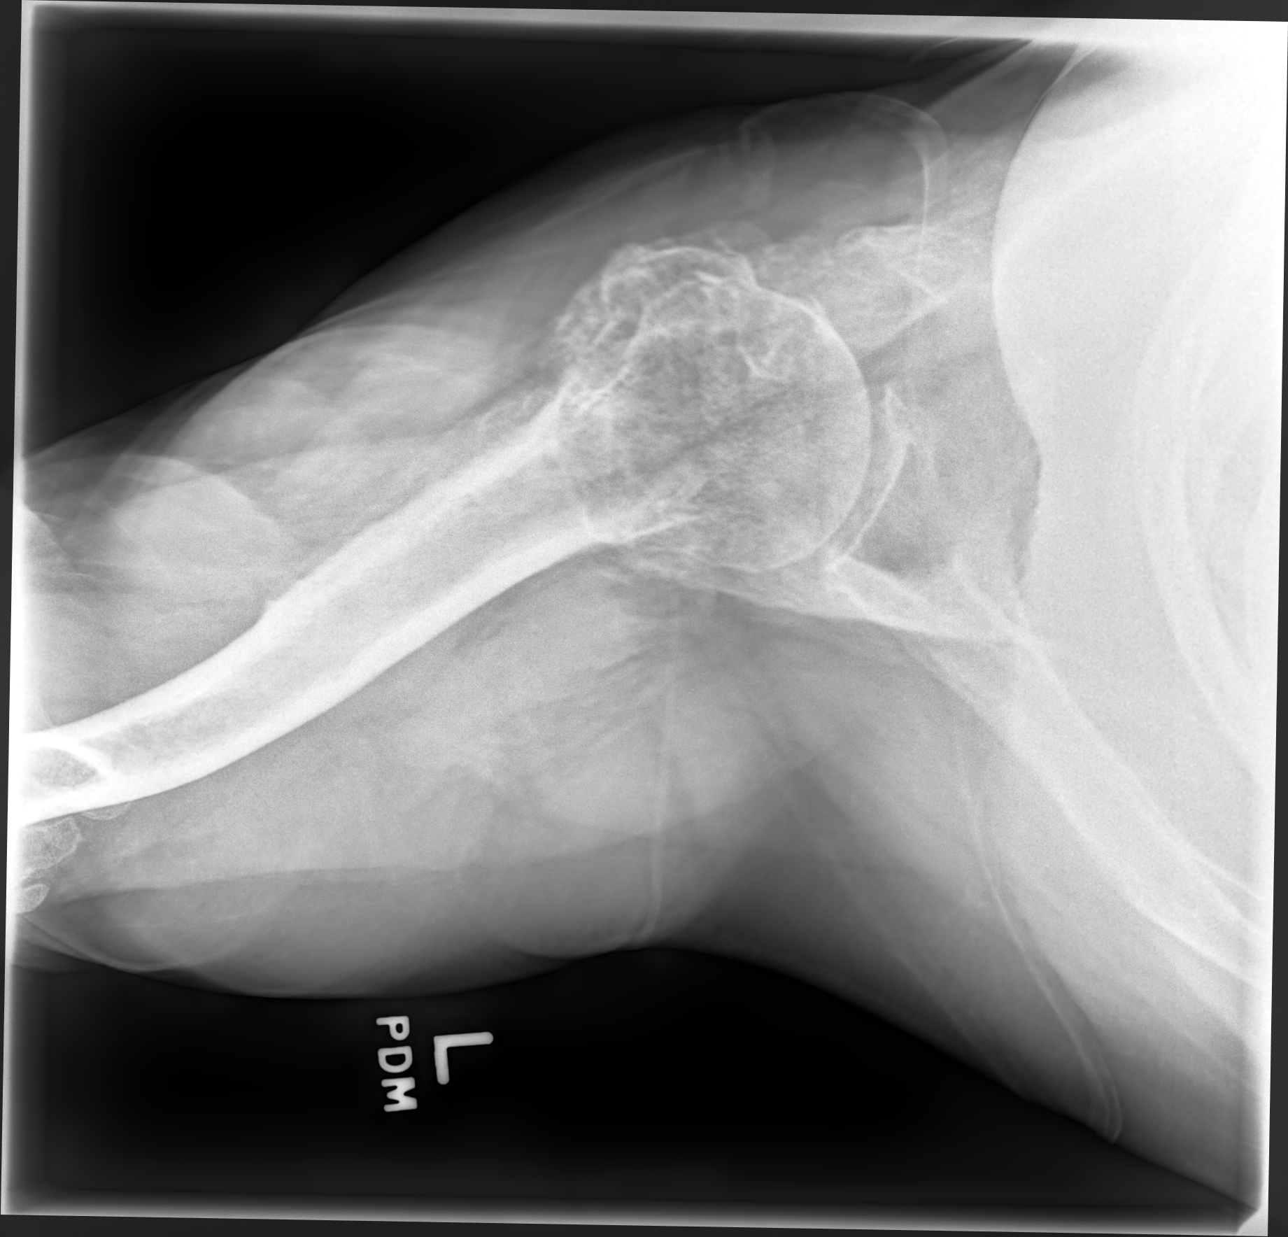

[3 of 3 positions shown; findings below may reference images not displayed]

FINDINGS: There is no evidence of fracture or dislocation. Severe degenerative
changes seen involving the left acromioclavicular joint. Mild
degenerative joint disease is seen involving the left glenohumeral
joint. Soft tissues are unremarkable.
IMPRESSION: Degenerative joint disease of the left acromioclavicular and
glenohumeral joints. No acute abnormality seen in the left shoulder.

## 2019-04-15 IMAGING — DX DG SHOULDER 2+V*R*
3 series · 3 of 3 positions shown · non-contrast
Comparison: None.

CLINICAL DATA: Chronic bilateral shoulder pain without known
injury.

EXAM:
RIGHT SHOULDER - 2+ VIEW

[shoulder (grashey view) ap]
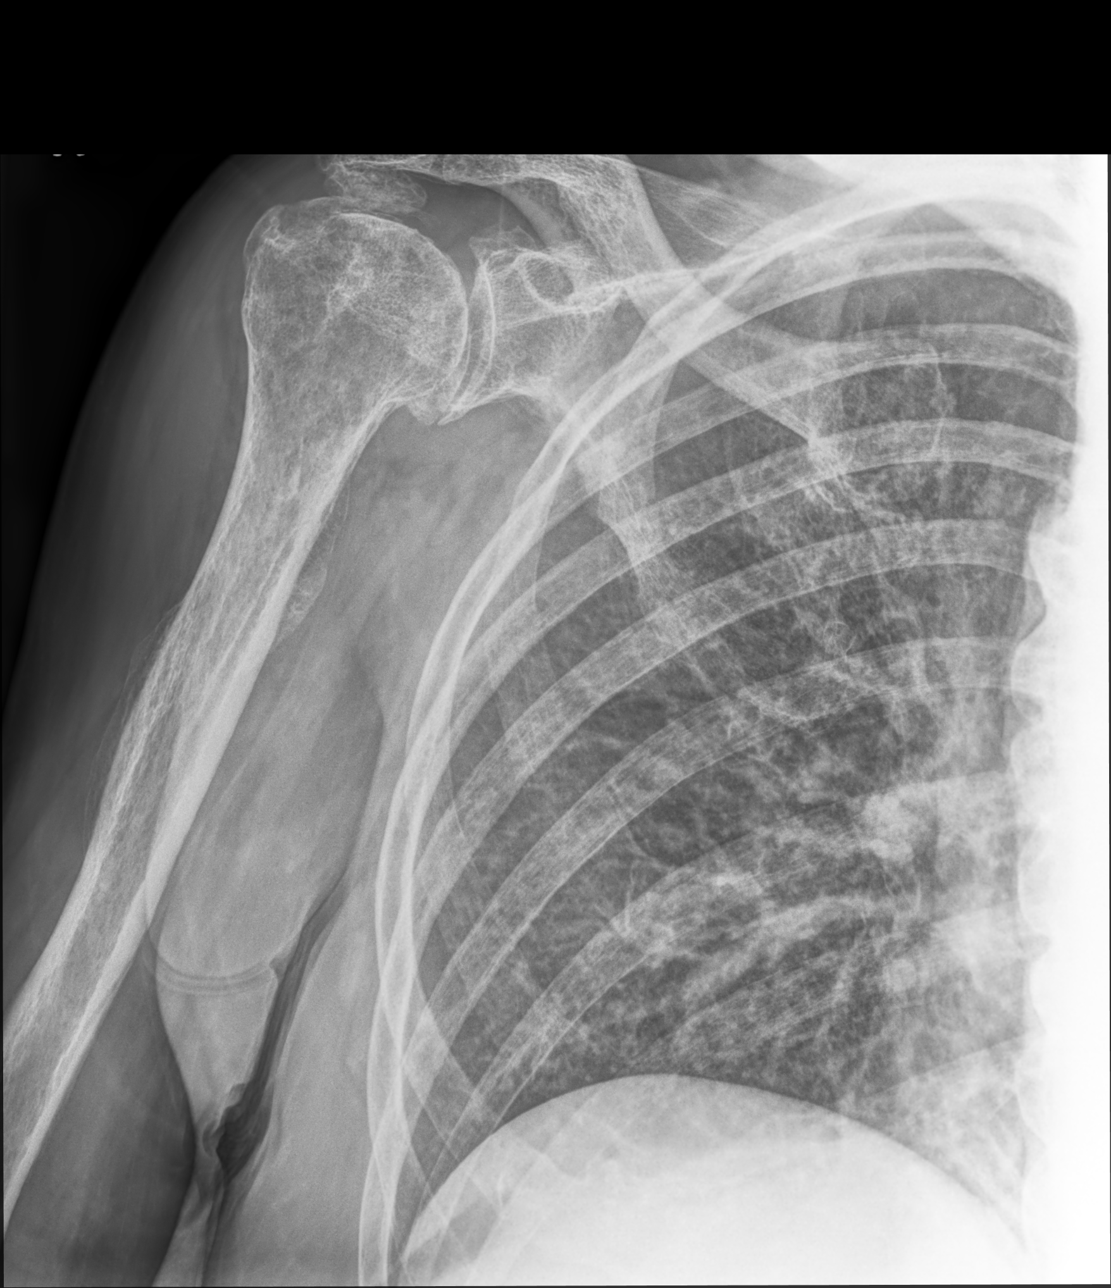

[shoulder (y view) lat]
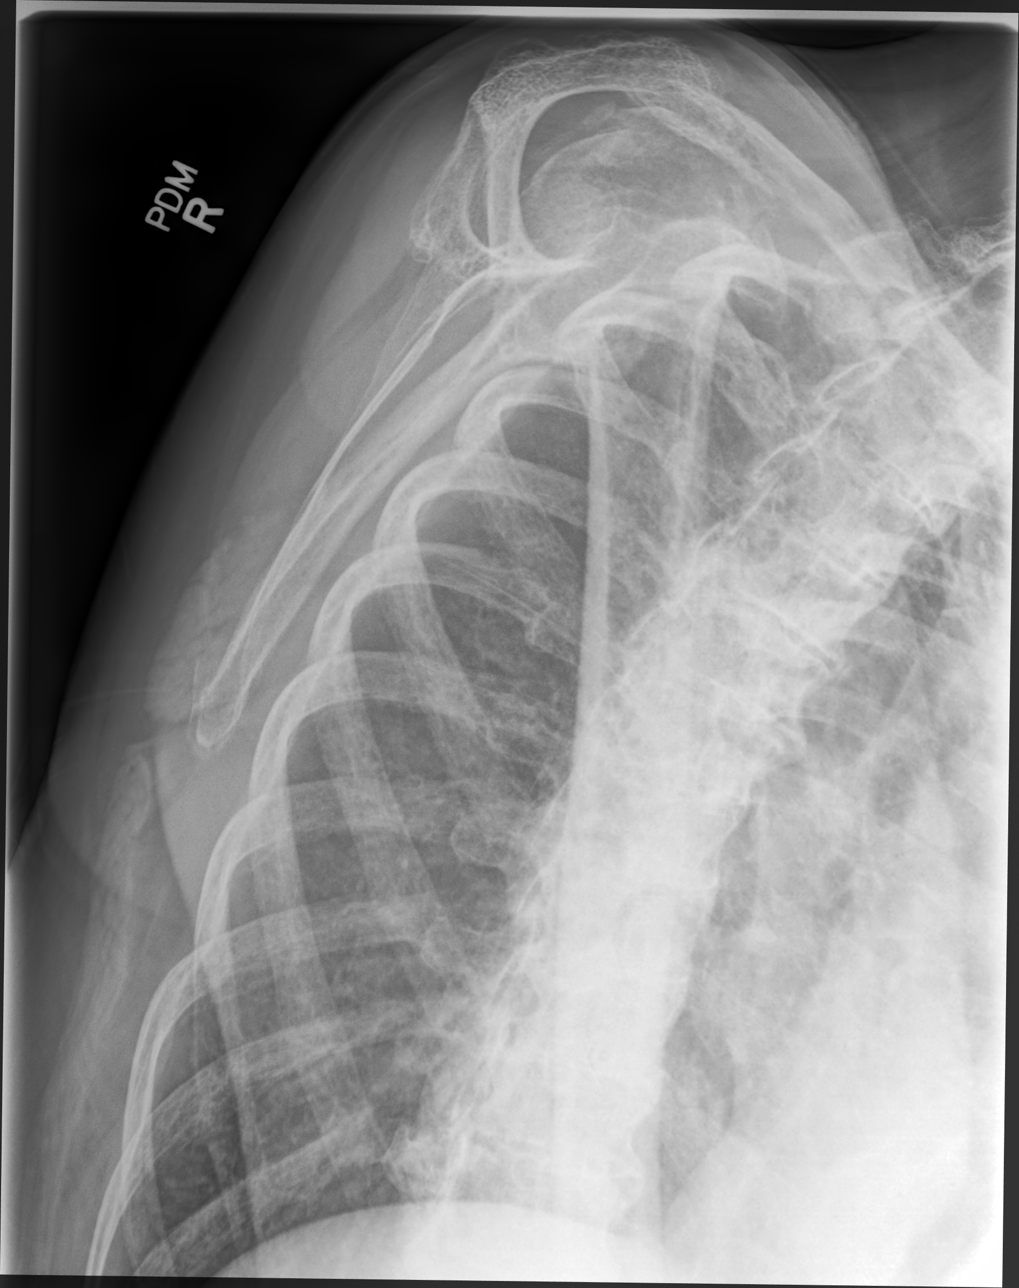

[shoulder axial 45°]
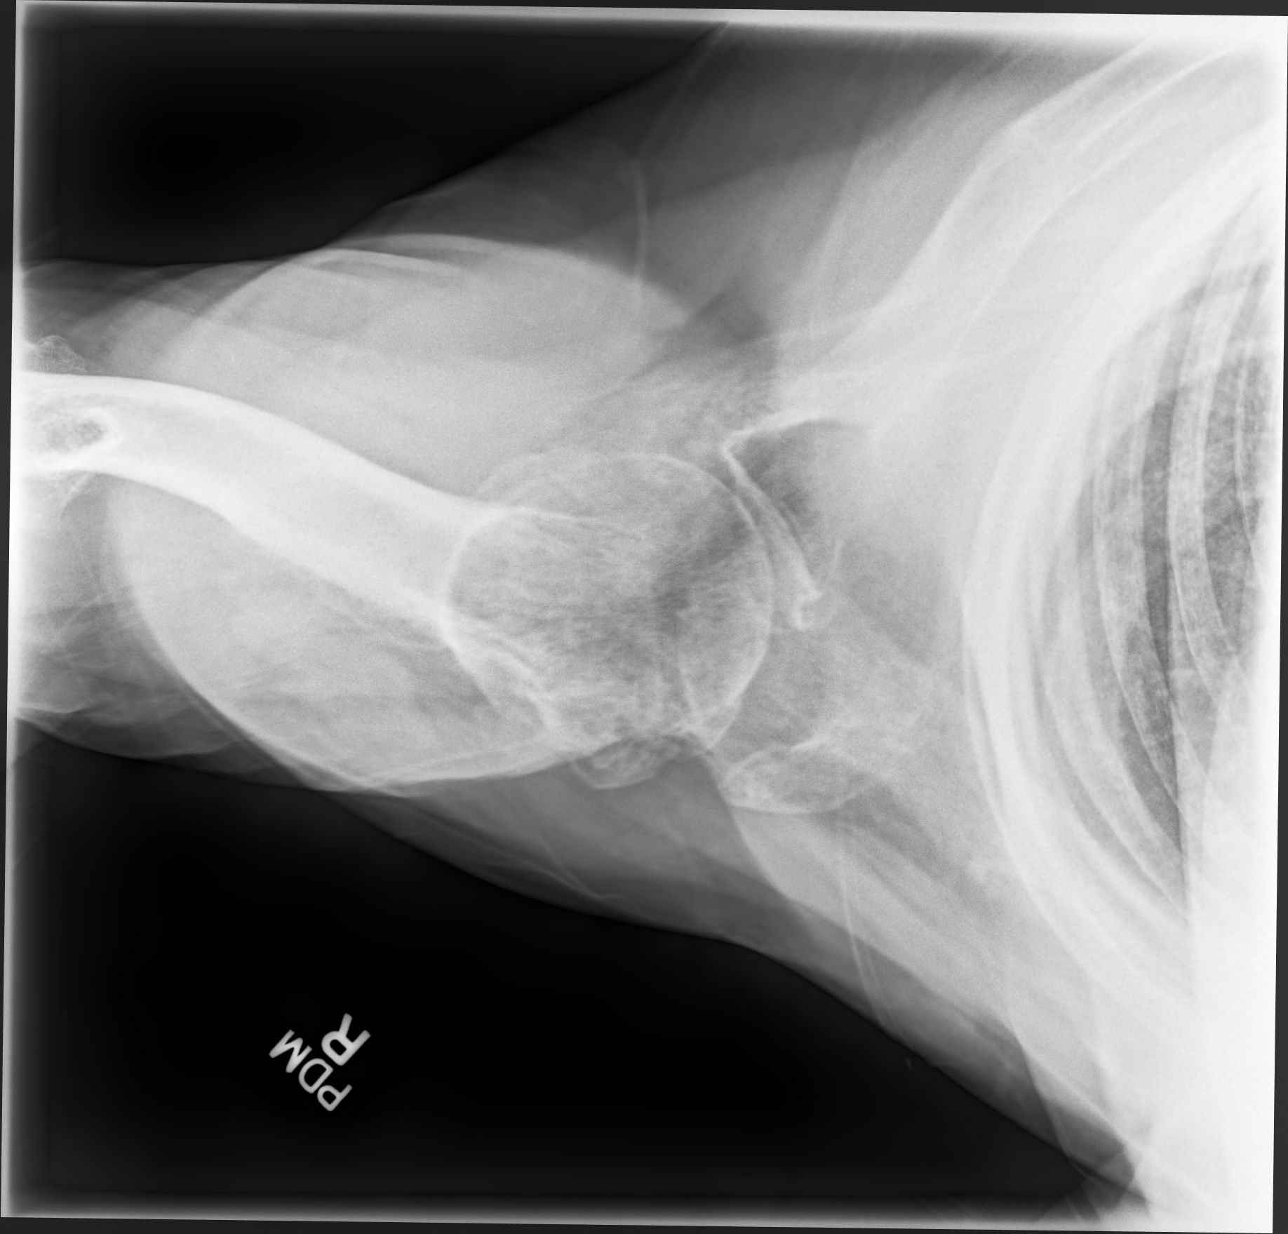

[3 of 3 positions shown; findings below may reference images not displayed]

FINDINGS: There is no evidence of fracture or dislocation. Severe degenerative
changes seen involving the right acromioclavicular joint. Mild
degenerative changes seen involving the right glenohumeral joint.
Soft tissues are unremarkable.
IMPRESSION: Degenerative joint disease of the right acromioclavicular and
glenohumeral joints. No acute abnormality seen in the right
shoulder.

## 2019-04-15 NOTE — Assessment & Plan Note (Signed)
Bilateral injections given today, tolerated the procedure well, discussed icing regimen and home exercise, discussed which activities to do which wants to avoid.  Patient will increase activity as tolerated.  Follow-up again in 4 to 8 weeks

## 2019-04-15 NOTE — Patient Instructions (Signed)
Good to see you.   Ice 20 minutes 2 times daily. Usually after activity and before bed. Tart cherry extract 1200mg  at night Vitamin D 2000 IU daily  See me again in 8-10 weeks

## 2019-04-15 NOTE — Progress Notes (Signed)
[facility] [place] Phone: [phone] Subjective:   Rito Ehrlich, am serving as a scribe for Dr. Hulan Saas.  This visit occurred during the SARS-CoV-2 public health emergency.  Safety protocols were in place, including screening questions prior to the visit, additional usage of staff PPE, and extensive cleaning of exam room while observing appropriate contact time as indicated for disinfecting solutions.   I'm seeing this patient by the request  of:  Bernerd Limbo, MD  CC: Bilateral shoulder pain   [id number]  Jacqueline Richmond is a 74 y.o. female coming in with complaint of bilateral shoulder pain. getting worse in the last month, pain comes and goes worse when brush hair, turn over in bed, cannot reach into cupboards. Not taking anything to help with the pain. Shooting type pain.     Past Medical History:  Diagnosis Date  . Allergy   . Anemia   . Arthritis   . Cataract    biltaerally   . CKD (chronic kidney disease) stage 3, GFR 30-59 ml/min 08/29/2011   per patient, kidneys monitored by Dr Posey Pronto endocrinologist ,lov was  05-18-2018, "kidney fx stable"  . Colon polyp   . Complication of anesthesia   . Difficult intubation    small mouth opening  . Diverticulosis   . Gout   . HTN (hypertension) 08/29/2011  . Hyperlipemia   . Hypertension   . Psoriasis   . Type II or unspecified type diabetes mellitus without mention of complication, not stated as uncontrolled 08/29/2011   Past Surgical History:  Procedure Laterality Date  . BREAST LUMPECTOMY WITH NEEDLE LOCALIZATION Right 05/07/2012   Procedure: BREAST LUMPECTOMY WITH NEEDLE LOCALIZATION;  Surgeon: Merrie Roof, MD;  Location: [facility];  Service: General;  Laterality: Right;  . CHOLECYSTECTOMY    . COLONOSCOPY  2009   Ardis Hughs   . COLONOSCOPY WITH PROPOFOL N/A 05/20/2018   Procedure: COLONOSCOPY WITH PROPOFOL;  Surgeon: Milus Banister, MD;  Location: [facility];  Service: Endoscopy;  Laterality: N/A;  . HERNIA REPAIR    . HIP SURGERY    . POLYPECTOMY  05/20/2018   Procedure: POLYPECTOMY;  Surgeon: Milus Banister, MD;  Location: [facility];  Service: Endoscopy;;  . RHINOPLASTY    . SALPINGOOPHORECTOMY    . TOTAL HIP ARTHROPLASTY Right 2004   Social History   Socioeconomic History  . Marital status: Married    Spouse name: Not on file  . Number of children: Not on file  . Years of education: Not on file  . Highest education level: Not on file  Occupational History  . Not on file  Tobacco Use  . Smoking status: Never Smoker  . Smokeless tobacco: Never Used  Substance and Sexual Activity  . Alcohol use: Yes    Comment: Drinks on Special Occasions  . Drug use: No  . Sexual activity: Never  Other Topics Concern  . Not on file  Social History Narrative  . Not on file   Social Determinants of Health   Financial Resource Strain:   . Difficulty of Paying Living Expenses: Not on file  Food Insecurity:   . Worried About Charity fundraiser in the Last Year: Not on file  . Ran Out of Food in the Last Year: Not on file  Transportation Needs:   . Lack of Transportation (Medical): Not on file  . Lack of Transportation (Non-Medical): Not on file  Physical  Activity:   . Days of Exercise per Week: Not on file  . Minutes of Exercise per Session: Not on file  Stress:   . Feeling of Stress : Not on file  Social Connections:   . Frequency of Communication with Friends and Family: Not on file  . Frequency of Social Gatherings with Friends and Family: Not on file  . Attends Religious Services: Not on file  . Active Member of Clubs or Organizations: Not on file  . Attends Archivist Meetings: Not on file  . Marital Status: Not on file   Allergies  Allergen Reactions  . Codeine Itching  . Iodinated Diagnostic Agents     Other reaction(s): Unknown  . Pentazocine Other (See Comments)    Hallucinations  . Statins     Family History  Problem Relation Age of Onset  . Heart disease Father   . Arthritis Sister   . Colon cancer Neg Hx   . Colon polyps Neg Hx   . Esophageal cancer Neg Hx   . Stomach cancer Neg Hx   . Rectal cancer Neg Hx     Current Outpatient Medications (Endocrine & Metabolic):  .  glipiZIDE (GLUCOTROL XL) 10 MG 24 hr tablet, Take 10 mg by mouth daily with breakfast. .  sitaGLIPtin (JANUVIA) 50 MG tablet, Take 50 mg by mouth daily.   Current Outpatient Medications (Cardiovascular):  .  atenolol (TENORMIN) 100 MG tablet, Take 100 mg by mouth daily. .  cloNIDine (CATAPRES) 0.3 MG tablet, Take 0.3 mg by mouth 2 (two) times daily. .  fenofibrate 54 MG tablet, Take 1 tablet (54 mg total) by mouth at bedtime. .  furosemide (LASIX) 40 MG tablet, Take 40 mg by mouth daily.  .  pravastatin (PRAVACHOL) 10 MG tablet, Take 1 tablet by mouth daily.  Current Outpatient Medications (Respiratory):  .  loratadine (CLARITIN) 10 MG tablet, Take 10 mg by mouth daily.  Current Outpatient Medications (Analgesics):  .  allopurinol (ZYLOPRIM) 300 MG tablet, Take 300 mg by mouth daily.  Marland Kitchen  aspirin EC 81 MG tablet, Take 81 mg by mouth daily. Reported on 06/19/2015   Current Outpatient Medications (Other):  .  b complex vitamins tablet, Take 1 tablet by mouth daily. Marland Kitchen  CINNAMON PO, Take 2,000 mg by mouth 2 (two) times daily. Reported on 06/19/2015 .  Garlic 4081 MG CAPS, Take 2,000 mg by mouth 2 (two) times daily. .  mometasone (ELOCON) 0.1 % cream, Apply 1 application topically daily as needed (psoriasis).  .  niacin 500 MG tablet, Take 500 mg by mouth 2 (two) times daily with a meal. Reported on 06/19/2015 .  Omega-3 1000 MG CAPS, Take 1,000 mg by mouth 2 (two) times daily.    Past medical history, social, surgical and family history all reviewed in electronic medical record.  No pertanent information unless stated regarding to the chief complaint.   Review of Systems:  No headache, visual  changes, nausea, vomiting, diarrhea, constipation, dizziness, abdominal pain, skin rash, fevers, chills, night sweats, weight loss, swollen lymph nodes, body aches, joint swelling, chest pain, shortness of breath, mood changes. POSITIVE muscle aches  Objective  Blood pressure 122/80, pulse 71, height 5' 1.75" (1.568 m), weight 177 lb (80.3 kg), SpO2 99 %.   General: No apparent distress alert and oriented x3 mood and affect normal, dressed appropriately.  HEENT: Pupils equal, extraocular movements intact  Respiratory: Patient's speak in full sentences and does not appear short of breath  Cardiovascular:  No lower extremity edema, non tender, no erythema  Skin: Warm dry intact with no signs of infection or rash on extremities or on axial skeleton.  Abdomen: Soft nontender  Neuro: Cranial nerves II through XII are intact, neurovascularly intact in all extremities with 2+ DTRs and 2+ pulses.  Lymph: No lymphadenopathy of posterior or anterior cervical chain or axillae bilaterally.  Gait significant antalgic gait MSK: Significant arthritic changes. Patient does have significant arthritic changes of the neck noted.  Only has 5 degrees of extension.  Negative Spurling's.  Tender to palpation in the parascapular region bilaterally. Bilateral shoulder exam shows the patient does have atrophy of the shoulder girdle bilaterally, active range of motion of forward flexion and 85 degrees bilaterally.  Minimal internal and external range of motion.  3-5 strength of rotator cuff bilaterally.  Mild crepitus with range of motion.  Procedure: Real-time Ultrasound Guided Injection of right glenohumeral joint Device: GE Logiq Q7  Ultrasound guided injection is preferred based studies that show increased duration, increased effect, greater accuracy, decreased procedural pain, increased response rate with ultrasound guided versus blind injection.  Verbal informed consent obtained.  Time-out conducted.  Noted no  overlying erythema, induration, or other signs of local infection.  Skin prepped in a sterile fashion.  Local anesthesia: Topical Ethyl chloride.  With sterile technique and under real time ultrasound guidance:  Joint visualized.  23g 1  inch needle inserted posterior approach. Pictures taken for needle placement. Patient did have injection of 2 cc of 1% lidocaine, 2 cc of 0.5% Marcaine, and 1.0 cc of Kenalog 40 mg/dL. Completed without difficulty  Pain immediately resolved suggesting accurate placement of the medication.  Advised to call if fevers/chills, erythema, induration, drainage, or persistent bleeding.  Images permanently stored and available for review in the ultrasound unit.  Impression: Technically successful ultrasound guided injection.  Procedure: Real-time Ultrasound Guided Injection of left glenohumeral joint Device: GE Logiq E  Ultrasound guided injection is preferred based studies that show increased duration, increased effect, greater accuracy, decreased procedural pain, increased response rate with ultrasound guided versus blind injection.  Verbal informed consent obtained.  Time-out conducted.  Noted no overlying erythema, induration, or other signs of local infection.  Skin prepped in a sterile fashion.  Local anesthesia: Topical Ethyl chloride.  With sterile technique and under real time ultrasound guidance:  Joint visualized.  21g 2 inch needle inserted posterior approach. Pictures taken for needle placement. Patient did have injection of 2 cc of 0.5% Marcaine, and 1cc of Kenalog 40 mg/dL. Completed without difficulty  Pain immediately resolved suggesting accurate placement of the medication.  Advised to call if fevers/chills, erythema, induration, drainage, or persistent bleeding.  Images permanently stored and available for review in the ultrasound unit.  Impression: Technically successful ultrasound guided injection.    Impression and Recommendations:     This  case required medical decision making of moderate complexity. The above documentation has been reviewed and is accurate and complete Lyndal Pulley, DO       Note: This dictation was prepared with Dragon dictation along with smaller phrase technology. Any transcriptional errors that result from this process are unintentional.

## 2019-05-01 ENCOUNTER — Ambulatory Visit: Payer: Medicare PPO | Attending: Internal Medicine

## 2019-05-01 DIAGNOSIS — Z23 Encounter for immunization: Secondary | ICD-10-CM | POA: Insufficient documentation

## 2019-05-01 NOTE — Progress Notes (Signed)
   Covid-19 Vaccination Clinic  Name:  Jacqueline Richmond    MRN: [id number] DOB: 1945-10-31  05/01/2019  Jacqueline Richmond was observed post Covid-19 immunization for 15 minutes without incidence. She was provided with Vaccine Information Sheet and instruction to access the V-Safe system.   Jacqueline Richmond was instructed to call 911 with any severe reactions post vaccine: Marland Kitchen Difficulty breathing  . Swelling of your face and throat  . A fast heartbeat  . A bad rash all over your body  . Dizziness and weakness    Immunizations Administered    Name Date Dose VIS Date Route   Pfizer COVID-19 Vaccine 05/01/2019  4:08 PM 0.3 mL 03/04/2019 Intramuscular   Manufacturer: [place]   Lot: [id number]   [place]: [address]-8

## 2019-05-26 ENCOUNTER — Ambulatory Visit: Payer: Medicare PPO | Attending: Internal Medicine

## 2019-05-26 DIAGNOSIS — Z23 Encounter for immunization: Secondary | ICD-10-CM

## 2019-05-26 NOTE — Progress Notes (Signed)
   Covid-19 Vaccination Clinic  Name:  Jacqueline Richmond    MRN: [id number] DOB: Dec 05, 1945  05/26/2019  [name] was observed post Covid-19 immunization for 15 minutes without incident. She was provided with Vaccine Information Sheet and instruction to access the V-Safe system.   [name] was instructed to call 911 with any severe reactions post vaccine: Marland Kitchen Difficulty breathing  . Swelling of face and throat  . A fast heartbeat  . A bad rash all over body  . Dizziness and weakness   Immunizations Administered    Name Date Dose VIS Date Route   Pfizer COVID-19 Vaccine 05/26/2019  2:56 PM 0.3 mL 03/04/2019 Intramuscular   Manufacturer: [place]   Lot: [id number]   [place]: [address]-7

## 2019-06-06 NOTE — Progress Notes (Signed)
[facility] [address] [place] Phone: [phone] Subjective:   I Jacqueline Richmond am serving as a Education administrator for Dr. Hulan Saas.  This visit occurred during the SARS-CoV-2 public health emergency.  Safety protocols were in place, including screening questions prior to the visit, additional usage of staff PPE, and extensive cleaning of exam room while observing appropriate contact time as indicated for disinfecting solutions.   I'm seeing this patient by the request  of:  Bernerd Limbo, MD  CC: Bilateral shoulder pain  MOQ:[id number]   04/15/2019 Bilateral injections given today, tolerated the procedure well, discussed icing regimen and home exercise, discussed which activities to do which wants to avoid.  Patient will increase activity as tolerated.  Follow-up again in 4 to 8 weeks  06/07/2019 Jacqueline Richmond is a 74 y.o. female coming in with complaint of bilateral shoulder pain. Right shoulder is doing well. Left shoulder is painful.  Patient left side seems to be worse patient has more of a dull, throbbing aching sensation.  States that the right side seems to be seen   Bilateral shoulder injections given April 15, 2019 for rotator cuff arthropathy  Past Medical History:  Diagnosis Date  . Allergy   . Anemia   . Arthritis   . Cataract    biltaerally   . CKD (chronic kidney disease) stage 3, GFR 30-59 ml/min 08/29/2011   per patient, kidneys monitored by Dr Posey Pronto endocrinologist ,lov was  05-18-2018, "kidney fx stable"  . Colon polyp   . Complication of anesthesia   . Difficult intubation    small mouth opening  . Diverticulosis   . Gout   . HTN (hypertension) 08/29/2011  . Hyperlipemia   . Hypertension   . Psoriasis   . Type II or unspecified type diabetes mellitus without mention of complication, not stated as uncontrolled 08/29/2011   Past Surgical History:  Procedure Laterality Date  . BREAST LUMPECTOMY WITH NEEDLE LOCALIZATION  Right 05/07/2012   Procedure: BREAST LUMPECTOMY WITH NEEDLE LOCALIZATION;  Surgeon: Merrie Roof, MD;  Location: [facility];  Service: General;  Laterality: Right;  . CHOLECYSTECTOMY    . COLONOSCOPY  2009   Ardis Hughs   . COLONOSCOPY WITH PROPOFOL N/A 05/20/2018   Procedure: COLONOSCOPY WITH PROPOFOL;  Surgeon: Milus Banister, MD;  Location: [facility];  Service: Endoscopy;  Laterality: N/A;  . HERNIA REPAIR    . HIP SURGERY    . POLYPECTOMY  05/20/2018   Procedure: POLYPECTOMY;  Surgeon: Milus Banister, MD;  Location: [facility];  Service: Endoscopy;;  . RHINOPLASTY    . SALPINGOOPHORECTOMY    . TOTAL HIP ARTHROPLASTY Right 2004   Social History   Socioeconomic History  . Marital status: Married    Spouse name: Not on file  . Number of children: Not on file  . Years of education: Not on file  . Highest education level: Not on file  Occupational History  . Not on file  Tobacco Use  . Smoking status: Never Smoker  . Smokeless tobacco: Never Used  Substance and Sexual Activity  . Alcohol use: Yes    Comment: Drinks on Special Occasions  . Drug use: No  . Sexual activity: Never  Other Topics Concern  . Not on file  Social History Narrative  . Not on file   Social Determinants of Health   Financial Resource Strain:   . Difficulty of Paying Living Expenses:   Food Insecurity:   .  Worried About Charity fundraiser in the Last Year:   . Arboriculturist in the Last Year:   Transportation Needs:   . Film/video editor (Medical):   Marland Kitchen Lack of Transportation (Non-Medical):   Physical Activity:   . Days of Exercise per Week:   . Minutes of Exercise per Session:   Stress:   . Feeling of Stress :   Social Connections:   . Frequency of Communication with Friends and Family:   . Frequency of Social Gatherings with Friends and Family:   . Attends Religious Services:   . Active Member of Clubs or Organizations:   . Attends Archivist Meetings:   Marland Kitchen Marital Status:     Allergies  Allergen Reactions  . Codeine Itching  . Iodinated Diagnostic Agents     Other reaction(s): Unknown  . Pentazocine Other (See Comments)    Hallucinations  . Statins    Family History  Problem Relation Age of Onset  . Heart disease Father   . Arthritis Sister   . Colon cancer Neg Hx   . Colon polyps Neg Hx   . Esophageal cancer Neg Hx   . Stomach cancer Neg Hx   . Rectal cancer Neg Hx     Current Outpatient Medications (Endocrine & Metabolic):  .  glipiZIDE (GLUCOTROL XL) 10 MG 24 hr tablet, Take 10 mg by mouth daily with breakfast. .  sitaGLIPtin (JANUVIA) 50 MG tablet, Take 50 mg by mouth daily.   Current Outpatient Medications (Cardiovascular):  .  atenolol (TENORMIN) 100 MG tablet, Take 100 mg by mouth daily. .  cloNIDine (CATAPRES) 0.3 MG tablet, Take 0.3 mg by mouth 2 (two) times daily. .  furosemide (LASIX) 40 MG tablet, Take 40 mg by mouth daily.  .  pravastatin (PRAVACHOL) 10 MG tablet, Take 1 tablet by mouth daily. .  fenofibrate 54 MG tablet, Take 1 tablet (54 mg total) by mouth at bedtime.  Current Outpatient Medications (Respiratory):  .  loratadine (CLARITIN) 10 MG tablet, Take 10 mg by mouth daily.  Current Outpatient Medications (Analgesics):  .  allopurinol (ZYLOPRIM) 300 MG tablet, Take 300 mg by mouth daily.  Marland Kitchen  aspirin EC 81 MG tablet, Take 81 mg by mouth daily. Reported on 06/19/2015 .  allopurinol (ZYLOPRIM) 300 MG tablet, Take 1 tablet (300 mg total) by mouth daily.   Current Outpatient Medications (Other):  .  b complex vitamins tablet, Take 1 tablet by mouth daily. Marland Kitchen  CINNAMON PO, Take 2,000 mg by mouth 2 (two) times daily. Reported on 06/19/2015 .  Garlic 5732 MG CAPS, Take 2,000 mg by mouth 2 (two) times daily. .  mometasone (ELOCON) 0.1 % cream, Apply 1 application topically daily as needed (psoriasis).  .  niacin 500 MG tablet, Take 500 mg by mouth 2 (two) times daily with a meal. Reported on 06/19/2015 .  Omega-3 1000 MG CAPS,  Take 1,000 mg by mouth 2 (two) times daily.   Reviewed prior external information including notes and imaging from  primary care provider As well as notes that were available from care everywhere and other healthcare systems.  Past medical history, social, surgical and family history all reviewed in electronic medical record.  No pertanent information unless stated regarding to the chief complaint.   Review of Systems:  No headache, visual changes, nausea, vomiting, diarrhea, constipation, dizziness, abdominal pain, skin rash, fevers, chills, night sweats, weight loss, swollen lymph nodes, body aches, joint swelling, chest pain, shortness  of breath, mood changes. POSITIVE muscle aches  Objective  Blood pressure 122/60, pulse 83, height 5\' 1"  (1.549 m), weight 167 lb (75.8 kg), SpO2 97 %.   General: No apparent distress alert and oriented x3 mood and affect normal, dressed appropriately.  HEENT: Pupils equal, extraocular movements intact  Respiratory: Patient's speak in full sentences and does not appear short of breath  MSK:  Focused exam shows the patient's left shoulder does have swelling anteriorly.  This is different than previous exam.  Seems to have more of a cystic fluid-filled area.  Decreased range of motion.  Limited musculoskeletal ultrasound was performed and interpreted .me Limited ultrasound of patient's left shoulder shows that patient does have hypoechoic changes significant for what likely is a parameniscal cyst is on the anterior aspect and does communicate with the bicep tendon. Impression: Effusion of the left shoulder new from previous exam with likely gouty deposits   Procedure: Real-time Ultrasound Guided Injection of left glenohumeral joint Device: GE Logiq E  Ultrasound guided injection is preferred based studies that show increased duration, increased effect, greater accuracy, decreased procedural pain, increased response rate with ultrasound guided versus blind  injection.  Verbal informed consent obtained.  Time-out conducted.  Noted no overlying erythema, induration, or other signs of local infection.  Skin prepped in a sterile fashion.  Local anesthesia: Topical Ethyl chloride.  With sterile technique and under real time ultrasound guidance:  Joint visualized.  21g 2 inch needle inserted anterior approach. Pictures taken for needle placement. Patient did have injection of 2 cc of 0.5% Marcaine, and aspirated 20 cc of cloudy fluid then injected 1cc of Kenalog 40 mg/dL. Completed without difficulty  Pain immediately resolved suggesting accurate placement of the medication.  Advised to call if fevers/chills, erythema, induration, drainage, or persistent bleeding.  Images permanently stored and available for review in the ultrasound unit.  Impression: Technically successful ultrasound guided injection.   Impression and Recommendations:     This case required medical decision making of moderate complexity. The above documentation has been reviewed and is accurate and complete Lyndal Pulley, DO       Note: This dictation was prepared with Dragon dictation along with smaller phrase technology. Any transcriptional errors that result from this process are unintentional.

## 2019-06-07 ENCOUNTER — Other Ambulatory Visit: Payer: Self-pay

## 2019-06-07 ENCOUNTER — Ambulatory Visit: Payer: Medicare PPO | Admitting: Family Medicine

## 2019-06-07 ENCOUNTER — Encounter: Payer: Self-pay | Admitting: Family Medicine

## 2019-06-07 ENCOUNTER — Ambulatory Visit (INDEPENDENT_AMBULATORY_CARE_PROVIDER_SITE_OTHER): Payer: Medicare PPO

## 2019-06-07 VITALS — BP 122/60 | HR 83 | Ht 61.0 in | Wt 167.0 lb

## 2019-06-07 DIAGNOSIS — M12812 Other specific arthropathies, not elsewhere classified, left shoulder: Secondary | ICD-10-CM | POA: Diagnosis not present

## 2019-06-07 DIAGNOSIS — M25512 Pain in left shoulder: Secondary | ICD-10-CM

## 2019-06-07 DIAGNOSIS — G8929 Other chronic pain: Secondary | ICD-10-CM

## 2019-06-07 DIAGNOSIS — M12811 Other specific arthropathies, not elsewhere classified, right shoulder: Secondary | ICD-10-CM | POA: Diagnosis not present

## 2019-06-07 DIAGNOSIS — M1A0120 Idiopathic chronic gout, left shoulder, without tophus (tophi): Secondary | ICD-10-CM

## 2019-06-07 LAB — TIQ-NTM

## 2019-06-07 MED ORDER — ALLOPURINOL 300 MG PO TABS: 300.0000 mg | ORAL_TABLET | Freq: Every day | ORAL | 1 refills | Status: DC

## 2019-06-07 NOTE — Addendum Note (Signed)
Addended by: Isaiah Serge D on: 06/07/2019 02:32 PM   Modules accepted: Orders

## 2019-06-07 NOTE — Assessment & Plan Note (Signed)
Left-sided was injected today.  Tolerated the procedure well.  Discussed icing regimen and home exercise, which activities are which wants to avoid.  Patient is to increase activity slowly over the course the next several weeks.  I do believe that with the aspiration of the left shoulder this seems to be more of a chronic problem with exacerbation of the underlying gout as well.  Increased allopurinol told her to take it regularly.  Discussed over-the-counter medications that can be beneficial.  Discussed with her that colchicine may be necessary for breakthrough swelling.  Follow-up with me again in 6 weeks.  Social determinants of health including patient's other comorbidities admitted difficult for patient to do exercises on a regular basis.

## 2019-06-07 NOTE — Patient Instructions (Addendum)
Good to see you Refilled allopurinol 300 mg daily Tart cherry 1200 mg at night See me again in 4-6 weeks

## 2019-06-08 LAB — SYNOVIAL CELL COUNT + DIFF, W/ CRYSTALS
Basophils, %: 0 %
Eosinophils-Synovial: 0 % (ref 0–2)
Lymphocytes-Synovial Fld: 3 % (ref 0–74)
Monocyte/Macrophage: 10 % (ref 0–69)
Neutrophil, Synovial: 87 % — ABNORMAL HIGH (ref 0–24)
Synoviocytes, %: 0 % (ref 0–15)
WBC, Synovial: 36460 cells/uL — ABNORMAL HIGH (ref <150)

## 2019-06-09 ENCOUNTER — Other Ambulatory Visit: Payer: Self-pay

## 2019-06-09 MED ORDER — DOXYCYCLINE HYCLATE 100 MG PO TABS: 100.0000 mg | ORAL_TABLET | Freq: Two times a day (BID) | ORAL | 0 refills | Status: DC

## 2019-07-12 ENCOUNTER — Ambulatory Visit: Payer: Medicare PPO | Admitting: Family Medicine

## 2019-07-12 ENCOUNTER — Encounter: Payer: Self-pay | Admitting: Family Medicine

## 2019-07-12 ENCOUNTER — Other Ambulatory Visit: Payer: Self-pay

## 2019-07-12 VITALS — BP 120/50 | HR 68 | Ht 61.0 in | Wt 164.0 lb

## 2019-07-12 DIAGNOSIS — M12812 Other specific arthropathies, not elsewhere classified, left shoulder: Secondary | ICD-10-CM

## 2019-07-12 DIAGNOSIS — M1A0120 Idiopathic chronic gout, left shoulder, without tophus (tophi): Secondary | ICD-10-CM

## 2019-07-12 DIAGNOSIS — M12811 Other specific arthropathies, not elsewhere classified, right shoulder: Secondary | ICD-10-CM | POA: Diagnosis not present

## 2019-07-12 NOTE — Progress Notes (Signed)
[facility] [address] [place] Phone: [phone] Subjective:   I Jacqueline Richmond am serving as a Education administrator for Dr. Hulan Saas.  This visit occurred during the SARS-CoV-2 public health emergency.  Safety protocols were in place, including screening questions prior to the visit, additional usage of staff PPE, and extensive cleaning of exam room while observing appropriate contact time as indicated for disinfecting solutions.   I'm seeing this patient by the request  of:  Bernerd Limbo, MD  CC: Bilateral shoulder pain left greater than right  [id number]   06/07/2019 Left-sided was injected today.  Tolerated the procedure well.  Discussed icing regimen and home exercise, which activities are which wants to avoid.  Patient is to increase activity slowly over the course the next several weeks.  I do believe that with the aspiration of the left shoulder this seems to be more of a chronic problem with exacerbation of the underlying gout as well.  Increased allopurinol told her to take it regularly.  Discussed over-the-counter medications that can be beneficial.  Discussed with her that colchicine may be necessary for breakthrough swelling.  Follow-up with me again in 6 weeks.  Social determinants of health including patient's other comorbidities admitted difficult for patient to do exercises on a regular basis.  Update 4/20/20201 Jacqueline Richmond is a 74 y.o. female coming in with complaint of bilateral shoulder pain. Patient states the left shoulder is stiff but did a lot better after drawing fluid off. Right shoulder is doing ok.  Patient states it is not as severe.  Continuing to take her allopurinol fairly regularly but not get any of the vitamin supplementation we discussed previously.  Patient has been having more control over her diet she feels as well when trying to avoid certain medications.  Patient was found to have potentially an infectious  etiology even after the aspiration of the shoulder but states feeling relatively better at the moment.       Past Medical History:  Diagnosis Date  . Allergy   . Anemia   . Arthritis   . Cataract    biltaerally   . CKD (chronic kidney disease) stage 3, GFR 30-59 ml/min 08/29/2011   per patient, kidneys monitored by Dr Posey Pronto endocrinologist ,lov was  05-18-2018, "kidney fx stable"  . Colon polyp   . Complication of anesthesia   . Difficult intubation    small mouth opening  . Diverticulosis   . Gout   . HTN (hypertension) 08/29/2011  . Hyperlipemia   . Hypertension   . Psoriasis   . Type II or unspecified type diabetes mellitus without mention of complication, not stated as uncontrolled 08/29/2011   Past Surgical History:  Procedure Laterality Date  . BREAST LUMPECTOMY WITH NEEDLE LOCALIZATION Right 05/07/2012   Procedure: BREAST LUMPECTOMY WITH NEEDLE LOCALIZATION;  Surgeon: Merrie Roof, MD;  Location: [facility];  Service: General;  Laterality: Right;  . CHOLECYSTECTOMY    . COLONOSCOPY  2009   Ardis Hughs   . COLONOSCOPY WITH PROPOFOL N/A 05/20/2018   Procedure: COLONOSCOPY WITH PROPOFOL;  Surgeon: Milus Banister, MD;  Location: [facility];  Service: Endoscopy;  Laterality: N/A;  . HERNIA REPAIR    . HIP SURGERY    . POLYPECTOMY  05/20/2018   Procedure: POLYPECTOMY;  Surgeon: Milus Banister, MD;  Location: [facility];  Service: Endoscopy;;  . RHINOPLASTY    . SALPINGOOPHORECTOMY    . TOTAL HIP ARTHROPLASTY Right  2004   Social History   Socioeconomic History  . Marital status: Married    Spouse name: Not on file  . Number of children: Not on file  . Years of education: Not on file  . Highest education level: Not on file  Occupational History  . Not on file  Tobacco Use  . Smoking status: Never Smoker  . Smokeless tobacco: Never Used  Substance and Sexual Activity  . Alcohol use: Yes    Comment: Drinks on Special Occasions  . Drug use: No  . Sexual activity: Never   Other Topics Concern  . Not on file  Social History Narrative  . Not on file   Social Determinants of Health   Financial Resource Strain:   . Difficulty of Paying Living Expenses:   Food Insecurity:   . Worried About Charity fundraiser in the Last Year:   . Arboriculturist in the Last Year:   Transportation Needs:   . Film/video editor (Medical):   Marland Kitchen Lack of Transportation (Non-Medical):   Physical Activity:   . Days of Exercise per Week:   . Minutes of Exercise per Session:   Stress:   . Feeling of Stress :   Social Connections:   . Frequency of Communication with Friends and Family:   . Frequency of Social Gatherings with Friends and Family:   . Attends Religious Services:   . Active Member of Clubs or Organizations:   . Attends Archivist Meetings:   Marland Kitchen Marital Status:    Allergies  Allergen Reactions  . Codeine Itching  . Iodinated Diagnostic Agents     Other reaction(s): Unknown  . Pentazocine Other (See Comments)    Hallucinations  . Statins    Family History  Problem Relation Age of Onset  . Heart disease Father   . Arthritis Sister   . Colon cancer Neg Hx   . Colon polyps Neg Hx   . Esophageal cancer Neg Hx   . Stomach cancer Neg Hx   . Rectal cancer Neg Hx     Current Outpatient Medications (Endocrine & Metabolic):  .  glipiZIDE (GLUCOTROL XL) 10 MG 24 hr tablet, Take 10 mg by mouth daily with breakfast. .  sitaGLIPtin (JANUVIA) 50 MG tablet, Take 50 mg by mouth daily.   Current Outpatient Medications (Cardiovascular):  .  atenolol (TENORMIN) 100 MG tablet, Take 100 mg by mouth daily. .  cloNIDine (CATAPRES) 0.3 MG tablet, Take 0.3 mg by mouth 2 (two) times daily. .  furosemide (LASIX) 40 MG tablet, Take 40 mg by mouth daily.  .  pravastatin (PRAVACHOL) 10 MG tablet, Take 1 tablet by mouth daily. .  fenofibrate 54 MG tablet, Take 1 tablet (54 mg total) by mouth at bedtime.  Current Outpatient Medications (Respiratory):  .   loratadine (CLARITIN) 10 MG tablet, Take 10 mg by mouth daily.  Current Outpatient Medications (Analgesics):  .  allopurinol (ZYLOPRIM) 300 MG tablet, Take 300 mg by mouth daily.  Marland Kitchen  allopurinol (ZYLOPRIM) 300 MG tablet, Take 1 tablet (300 mg total) by mouth daily. Marland Kitchen  aspirin EC 81 MG tablet, Take 81 mg by mouth daily. Reported on 06/19/2015   Current Outpatient Medications (Other):  .  b complex vitamins tablet, Take 1 tablet by mouth daily. Marland Kitchen  CINNAMON PO, Take 2,000 mg by mouth 2 (two) times daily. Reported on 06/19/2015 .  doxycycline (VIBRA-TABS) 100 MG tablet, Take 1 tablet (100 mg total) by mouth 2 (  two) times daily. .  Garlic 3235 MG CAPS, Take 2,000 mg by mouth 2 (two) times daily. .  mometasone (ELOCON) 0.1 % cream, Apply 1 application topically daily as needed (psoriasis).  .  niacin 500 MG tablet, Take 500 mg by mouth 2 (two) times daily with a meal. Reported on 06/19/2015 .  Omega-3 1000 MG CAPS, Take 1,000 mg by mouth 2 (two) times daily.   Reviewed prior external information including notes and imaging from  primary care provider As well as notes that were available from care everywhere and other healthcare systems.  Past medical history, social, surgical and family history all reviewed in electronic medical record.  No pertanent information unless stated regarding to the chief complaint.   Review of Systems:  No headache, visual changes, nausea, vomiting, diarrhea, constipation, dizziness, abdominal pain, skin rash, fevers, chills, night sweats, weight loss, swollen lymph nodes, body aches, joint swelling, chest pain, shortness of breath, mood changes. POSITIVE muscle aches  Objective  Blood pressure (!) 120/50, pulse 68, height 5\' 1"  (1.549 m), weight 164 lb (74.4 kg), SpO2 97 %.   General: No apparent distress alert and oriented x3 mood and affect normal, dressed appropriately.  HEENT: Pupils equal, extraocular movements intact  Respiratory: Patient's speak in full  sentences and does not appear short of breath  Cardiovascular: Trace lower extremity edema, non tender, no erythema  Neuro: Cranial nerves II through XII are intact, neurovascularly intact in all extremities with 2+ DTRs and 2+ pulses.  Gait mild antalgic MSK: Significant arthritic changes noted in multiple joints.  Shoulders bilaterally do have atrophy noted.  No significant crepitus noted with range of motion.  Patient only has 90 degrees of forward flexion on the left side and 110 degrees on the contralateral side.    Impression and Recommendations:     The above documentation has been reviewed and is accurate and complete Lyndal Pulley, DO       Note: This dictation was prepared with Dragon dictation along with smaller phrase technology. Any transcriptional errors that result from this process are unintentional.

## 2019-07-12 NOTE — Assessment & Plan Note (Signed)
Rotator cuff arthropathy but doing a little better at this time.  Patient is taking the allopurinol 300 mg.  Started tart cherry.  Discussed with her about these medications great detail.  Has run significant amount of difficulty causing some erosive arthritic changes as well.  Patient wants to continue with conservative therapy.  At follow-up if continuing to have pain we will consider the glenohumeral and acromioclavicular injection follow-up again in 3 months patient will increase activity as well.  Stay hydrated.  Discussed with patient about different treatment options and was with patient for greater than 32 minutes today

## 2019-07-12 NOTE — Patient Instructions (Addendum)
Good to see you Take tart cherry extract 1200mg  at night  See me again 2 months

## 2019-09-14 ENCOUNTER — Ambulatory Visit: Payer: Medicare PPO | Admitting: Family Medicine

## 2019-09-14 NOTE — Progress Notes (Deleted)
Jacqueline Richmond Phone: [phone] Subjective:    I'm seeing this patient by the request  of:  Bernerd Limbo, MD  CC:   [id number]   07/12/2019 Rotator cuff arthropathy but doing a little better at this time.  Patient is taking the allopurinol 300 mg.  Started tart cherry.  Discussed with her about these medications great detail.  Has run significant amount of difficulty causing some erosive arthritic changes as well.  Patient wants to continue with conservative therapy.  At follow-up if continuing to have pain we will consider the glenohumeral and acromioclavicular injection follow-up again in 3 months patient will increase activity as well.  Stay hydrated.  Discussed with patient about different treatment options and was with patient for greater than 32 minutes today  Update 09/14/2019 Jacqueline Richmond is a 74 y.o. female coming in with complaint of left shoulder pain.        Past Medical History:  Diagnosis Date  . Allergy   . Anemia   . Arthritis   . Cataract    biltaerally   . CKD (chronic kidney disease) stage 3, GFR 30-59 ml/min 08/29/2011   per patient, kidneys monitored by Dr Posey Pronto endocrinologist ,lov was  05-18-2018, "kidney fx stable"  . Colon polyp   . Complication of anesthesia   . Difficult intubation    small mouth opening  . Diverticulosis   . Gout   . HTN (hypertension) 08/29/2011  . Hyperlipemia   . Hypertension   . Psoriasis   . Type II or unspecified type diabetes mellitus without mention of complication, not stated as uncontrolled 08/29/2011   Past Surgical History:  Procedure Laterality Date  . BREAST LUMPECTOMY WITH NEEDLE LOCALIZATION Right 05/07/2012   Procedure: BREAST LUMPECTOMY WITH NEEDLE LOCALIZATION;  Surgeon: Merrie Roof, MD;  Location: [facility];  Service: General;  Laterality: Right;  . CHOLECYSTECTOMY    . COLONOSCOPY  2009   Ardis Hughs   . COLONOSCOPY WITH PROPOFOL N/A 05/20/2018     Procedure: COLONOSCOPY WITH PROPOFOL;  Surgeon: Milus Banister, MD;  Location: [facility];  Service: Endoscopy;  Laterality: N/A;  . HERNIA REPAIR    . HIP SURGERY    . POLYPECTOMY  05/20/2018   Procedure: POLYPECTOMY;  Surgeon: Milus Banister, MD;  Location: [facility];  Service: Endoscopy;;  . RHINOPLASTY    . SALPINGOOPHORECTOMY    . TOTAL HIP ARTHROPLASTY Right 2004   Social History   Socioeconomic History  . Marital status: Married    Spouse name: Not on file  . Number of children: Not on file  . Years of education: Not on file  . Highest education level: Not on file  Occupational History  . Not on file  Tobacco Use  . Smoking status: Never Smoker  . Smokeless tobacco: Never Used  Vaping Use  . Vaping Use: Never used  Substance and Sexual Activity  . Alcohol use: Yes    Comment: Drinks on Special Occasions  . Drug use: No  . Sexual activity: Never  Other Topics Concern  . Not on file  Social History Narrative  . Not on file   Social Determinants of Health   Financial Resource Strain:   . Difficulty of Paying Living Expenses:   Food Insecurity:   . Worried About Charity fundraiser in the Last Year:   . Arboriculturist in the Last Year:   Transportation Needs:   .  Lack of Transportation (Medical):   Marland Kitchen Lack of Transportation (Non-Medical):   Physical Activity:   . Days of Exercise per Week:   . Minutes of Exercise per Session:   Stress:   . Feeling of Stress :   Social Connections:   . Frequency of Communication with Friends and Family:   . Frequency of Social Gatherings with Friends and Family:   . Attends Religious Services:   . Active Member of Clubs or Organizations:   . Attends Archivist Meetings:   Marland Kitchen Marital Status:    Allergies  Allergen Reactions  . Codeine Itching  . Iodinated Diagnostic Agents     Other reaction(s): Unknown  . Pentazocine Other (See Comments)    Hallucinations  . Statins    Family History  Problem  Relation Age of Onset  . Heart disease Father   . Arthritis Sister   . Colon cancer Neg Hx   . Colon polyps Neg Hx   . Esophageal cancer Neg Hx   . Stomach cancer Neg Hx   . Rectal cancer Neg Hx     Current Outpatient Medications (Endocrine & Metabolic):  .  glipiZIDE (GLUCOTROL XL) 10 MG 24 hr tablet, Take 10 mg by mouth daily with breakfast. .  sitaGLIPtin (JANUVIA) 50 MG tablet, Take 50 mg by mouth daily.   Current Outpatient Medications (Cardiovascular):  .  atenolol (TENORMIN) 100 MG tablet, Take 100 mg by mouth daily. .  cloNIDine (CATAPRES) 0.3 MG tablet, Take 0.3 mg by mouth 2 (two) times daily. .  fenofibrate 54 MG tablet, Take 1 tablet (54 mg total) by mouth at bedtime. .  furosemide (LASIX) 40 MG tablet, Take 40 mg by mouth daily.  .  pravastatin (PRAVACHOL) 10 MG tablet, Take 1 tablet by mouth daily.  Current Outpatient Medications (Respiratory):  .  loratadine (CLARITIN) 10 MG tablet, Take 10 mg by mouth daily.  Current Outpatient Medications (Analgesics):  .  allopurinol (ZYLOPRIM) 300 MG tablet, Take 300 mg by mouth daily.  Marland Kitchen  allopurinol (ZYLOPRIM) 300 MG tablet, Take 1 tablet (300 mg total) by mouth daily. Marland Kitchen  aspirin EC 81 MG tablet, Take 81 mg by mouth daily. Reported on 06/19/2015   Current Outpatient Medications (Other):  .  b complex vitamins tablet, Take 1 tablet by mouth daily. Marland Kitchen  CINNAMON PO, Take 2,000 mg by mouth 2 (two) times daily. Reported on 06/19/2015 .  doxycycline (VIBRA-TABS) 100 MG tablet, Take 1 tablet (100 mg total) by mouth 2 (two) times daily. .  Garlic 6270 MG CAPS, Take 2,000 mg by mouth 2 (two) times daily. .  mometasone (ELOCON) 0.1 % cream, Apply 1 application topically daily as needed (psoriasis).  .  niacin 500 MG tablet, Take 500 mg by mouth 2 (two) times daily with a meal. Reported on 06/19/2015 .  Omega-3 1000 MG CAPS, Take 1,000 mg by mouth 2 (two) times daily.   Reviewed prior external information including notes and imaging from   primary care provider As well as notes that were available from care everywhere and other healthcare systems.  Past medical history, social, surgical and family history all reviewed in electronic medical record.  No pertanent information unless stated regarding to the chief complaint.   Review of Systems:  No headache, visual changes, nausea, vomiting, diarrhea, constipation, dizziness, abdominal pain, skin rash, fevers, chills, night sweats, weight loss, swollen lymph nodes, body aches, joint swelling, chest pain, shortness of breath, mood changes. POSITIVE muscle aches  Objective  There were no vitals taken for this visit.   General: No apparent distress alert and oriented x3 mood and affect normal, dressed appropriately.  HEENT: Pupils equal, extraocular movements intact  Respiratory: Patient's speak in full sentences and does not appear short of breath  Cardiovascular: No lower extremity edema, non tender, no erythema  Neuro: Cranial nerves II through XII are intact, neurovascularly intact in all extremities with 2+ DTRs and 2+ pulses.  Gait normal with good balance and coordination.  MSK:  Non tender with full range of motion and good stability and symmetric strength and tone of shoulders, elbows, wrist, hip, knee and ankles bilaterally.     Impression and Recommendations:     The above documentation has been reviewed and is accurate and complete Jacqualin Combes       Note: This dictation was prepared with Dragon dictation along with smaller phrase technology. Any transcriptional errors that result from this process are unintentional.

## 2019-11-22 ENCOUNTER — Ambulatory Visit: Payer: Medicare PPO | Attending: Internal Medicine

## 2019-11-22 DIAGNOSIS — Z23 Encounter for immunization: Secondary | ICD-10-CM

## 2019-11-22 NOTE — Progress Notes (Signed)
   Cov[facility] Vaccination [facility]  Name:  Jacqueline Richmond    MRN: [id number] DOB: 1946-02-06  11/22/2019  [name] was observed post Covid-19 immunization for 15 minutes without incident. She was provided with Vaccine Information Sheet and instruction to access the V-Safe system.   [name] was instructed to call 911 with any severe reactions post vaccine: Marland Kitchen Difficulty breathing  . Swelling of face and throat  . A fast heartbeat  . A bad rash all over body  . Dizziness and weakness

## 2020-05-23 DIAGNOSIS — E1142 Type 2 diabetes mellitus with diabetic polyneuropathy: Secondary | ICD-10-CM | POA: Insufficient documentation

## 2020-07-31 ENCOUNTER — Other Ambulatory Visit: Payer: Self-pay

## 2020-07-31 ENCOUNTER — Emergency Department (HOSPITAL_BASED_OUTPATIENT_CLINIC_OR_DEPARTMENT_OTHER): Payer: Medicare PPO | Admitting: Radiology

## 2020-07-31 ENCOUNTER — Emergency Department (HOSPITAL_BASED_OUTPATIENT_CLINIC_OR_DEPARTMENT_OTHER): Payer: Medicare PPO

## 2020-07-31 ENCOUNTER — Encounter (HOSPITAL_BASED_OUTPATIENT_CLINIC_OR_DEPARTMENT_OTHER): Payer: Self-pay | Admitting: Emergency Medicine

## 2020-07-31 ENCOUNTER — Inpatient Hospital Stay (HOSPITAL_BASED_OUTPATIENT_CLINIC_OR_DEPARTMENT_OTHER)
Admission: AD | Admit: 2020-07-31 | Discharge: 2020-08-08 | DRG: 981 | Disposition: A | Discharge status: Skilled Nursing Facility | Payer: Medicare PPO | Attending: Internal Medicine | Admitting: Internal Medicine

## 2020-07-31 DIAGNOSIS — L409 Psoriasis, unspecified: Secondary | ICD-10-CM | POA: Diagnosis present

## 2020-07-31 DIAGNOSIS — Z79899 Other long term (current) drug therapy: Secondary | ICD-10-CM

## 2020-07-31 DIAGNOSIS — Z91041 Radiographic dye allergy status: Secondary | ICD-10-CM

## 2020-07-31 DIAGNOSIS — M47814 Spondylosis without myelopathy or radiculopathy, thoracic region: Secondary | ICD-10-CM | POA: Diagnosis present

## 2020-07-31 DIAGNOSIS — E875 Hyperkalemia: Secondary | ICD-10-CM | POA: Diagnosis not present

## 2020-07-31 DIAGNOSIS — M109 Gout, unspecified: Secondary | ICD-10-CM | POA: Diagnosis present

## 2020-07-31 DIAGNOSIS — W109XXA Fall (on) (from) unspecified stairs and steps, initial encounter: Secondary | ICD-10-CM | POA: Diagnosis present

## 2020-07-31 DIAGNOSIS — I5033 Acute on chronic diastolic (congestive) heart failure: Secondary | ICD-10-CM | POA: Diagnosis not present

## 2020-07-31 DIAGNOSIS — R569 Unspecified convulsions: Secondary | ICD-10-CM | POA: Diagnosis present

## 2020-07-31 DIAGNOSIS — N179 Acute kidney failure, unspecified: Secondary | ICD-10-CM | POA: Diagnosis present

## 2020-07-31 DIAGNOSIS — R0603 Acute respiratory distress: Secondary | ICD-10-CM

## 2020-07-31 DIAGNOSIS — S271XXA Traumatic hemothorax, initial encounter: Secondary | ICD-10-CM | POA: Diagnosis not present

## 2020-07-31 DIAGNOSIS — Z888 Allergy status to other drugs, medicaments and biological substances status: Secondary | ICD-10-CM

## 2020-07-31 DIAGNOSIS — E669 Obesity, unspecified: Secondary | ICD-10-CM | POA: Diagnosis present

## 2020-07-31 DIAGNOSIS — Z7982 Long term (current) use of aspirin: Secondary | ICD-10-CM

## 2020-07-31 DIAGNOSIS — Z9289 Personal history of other medical treatment: Secondary | ICD-10-CM

## 2020-07-31 DIAGNOSIS — E785 Hyperlipidemia, unspecified: Secondary | ICD-10-CM | POA: Diagnosis present

## 2020-07-31 DIAGNOSIS — Z20822 Contact with and (suspected) exposure to covid-19: Secondary | ICD-10-CM | POA: Diagnosis present

## 2020-07-31 DIAGNOSIS — S5002XA Contusion of left elbow, initial encounter: Secondary | ICD-10-CM | POA: Diagnosis present

## 2020-07-31 DIAGNOSIS — Z8261 Family history of arthritis: Secondary | ICD-10-CM

## 2020-07-31 DIAGNOSIS — N1831 Chronic kidney disease, stage 3a: Secondary | ICD-10-CM | POA: Diagnosis present

## 2020-07-31 DIAGNOSIS — E049 Nontoxic goiter, unspecified: Secondary | ICD-10-CM

## 2020-07-31 DIAGNOSIS — J90 Pleural effusion, not elsewhere classified: Secondary | ICD-10-CM

## 2020-07-31 DIAGNOSIS — Z4659 Encounter for fitting and adjustment of other gastrointestinal appliance and device: Secondary | ICD-10-CM

## 2020-07-31 DIAGNOSIS — E041 Nontoxic single thyroid nodule: Secondary | ICD-10-CM

## 2020-07-31 DIAGNOSIS — W19XXXA Unspecified fall, initial encounter: Secondary | ICD-10-CM

## 2020-07-31 DIAGNOSIS — Z6832 Body mass index (BMI) 32.0-32.9, adult: Secondary | ICD-10-CM

## 2020-07-31 DIAGNOSIS — J942 Hemothorax: Secondary | ICD-10-CM | POA: Diagnosis present

## 2020-07-31 DIAGNOSIS — Z9889 Other specified postprocedural states: Secondary | ICD-10-CM

## 2020-07-31 DIAGNOSIS — N184 Chronic kidney disease, stage 4 (severe): Secondary | ICD-10-CM | POA: Diagnosis present

## 2020-07-31 DIAGNOSIS — Z8249 Family history of ischemic heart disease and other diseases of the circulatory system: Secondary | ICD-10-CM

## 2020-07-31 DIAGNOSIS — E1122 Type 2 diabetes mellitus with diabetic chronic kidney disease: Secondary | ICD-10-CM | POA: Diagnosis present

## 2020-07-31 DIAGNOSIS — I10 Essential (primary) hypertension: Secondary | ICD-10-CM | POA: Diagnosis present

## 2020-07-31 DIAGNOSIS — Z885 Allergy status to narcotic agent status: Secondary | ICD-10-CM

## 2020-07-31 DIAGNOSIS — Z419 Encounter for procedure for purposes other than remedying health state, unspecified: Secondary | ICD-10-CM

## 2020-07-31 DIAGNOSIS — M2578 Osteophyte, vertebrae: Secondary | ICD-10-CM | POA: Diagnosis present

## 2020-07-31 DIAGNOSIS — S22079A Unspecified fracture of T9-T10 vertebra, initial encounter for closed fracture: Secondary | ICD-10-CM

## 2020-07-31 DIAGNOSIS — J9601 Acute respiratory failure with hypoxia: Secondary | ICD-10-CM | POA: Diagnosis not present

## 2020-07-31 DIAGNOSIS — K5730 Diverticulosis of large intestine without perforation or abscess without bleeding: Secondary | ICD-10-CM | POA: Diagnosis present

## 2020-07-31 DIAGNOSIS — M19022 Primary osteoarthritis, left elbow: Secondary | ICD-10-CM | POA: Diagnosis present

## 2020-07-31 DIAGNOSIS — I130 Hypertensive heart and chronic kidney disease with heart failure and stage 1 through stage 4 chronic kidney disease, or unspecified chronic kidney disease: Secondary | ICD-10-CM | POA: Diagnosis present

## 2020-07-31 DIAGNOSIS — R52 Pain, unspecified: Secondary | ICD-10-CM

## 2020-07-31 DIAGNOSIS — Z7984 Long term (current) use of oral hypoglycemic drugs: Secondary | ICD-10-CM

## 2020-07-31 DIAGNOSIS — J918 Pleural effusion in other conditions classified elsewhere: Secondary | ICD-10-CM | POA: Diagnosis not present

## 2020-07-31 DIAGNOSIS — S0101XA Laceration without foreign body of scalp, initial encounter: Secondary | ICD-10-CM

## 2020-07-31 LAB — CBC
HCT: 35.1 % — ABNORMAL LOW (ref 36.0–46.0)
Hemoglobin: 11.2 g/dL — ABNORMAL LOW (ref 12.0–15.0)
MCH: 27.7 pg (ref 26.0–34.0)
MCHC: 31.9 g/dL (ref 30.0–36.0)
MCV: 86.9 fL (ref 80.0–100.0)
Platelets: 231 10*3/uL (ref 150–400)
RBC: 4.04 MIL/uL (ref 3.87–5.11)
RDW: 15 % (ref 11.5–15.5)
WBC: 13.5 10*3/uL — ABNORMAL HIGH (ref 4.0–10.5)
nRBC: 0 % (ref 0.0–0.2)

## 2020-07-31 LAB — BASIC METABOLIC PANEL
Anion gap: 10 (ref 5–15)
BUN: 61 mg/dL — ABNORMAL HIGH (ref 8–23)
CO2: 21 mmol/L — ABNORMAL LOW (ref 22–32)
Calcium: 9.4 mg/dL (ref 8.9–10.3)
Chloride: 104 mmol/L (ref 98–111)
Creatinine, Ser: 2.05 mg/dL — ABNORMAL HIGH (ref 0.44–1.00)
GFR, Estimated: 25 mL/min — ABNORMAL LOW (ref >60)
Glucose, Bld: 113 mg/dL — ABNORMAL HIGH (ref 70–99)
Potassium: 4.5 mmol/L (ref 3.5–5.1)
Sodium: 135 mmol/L (ref 135–145)

## 2020-07-31 IMAGING — DX DG RIBS W/ CHEST 3+V BILAT
5 series · 5 of 5 positions shown · non-contrast
Comparison: Remote chest radiograph [DATE]. No interval chest
imaging.

CLINICAL DATA: Fall.  Pain.

EXAM:
BILATERAL RIBS AND CHEST - 4+ VIEW

[rib ap (1 of 2)]
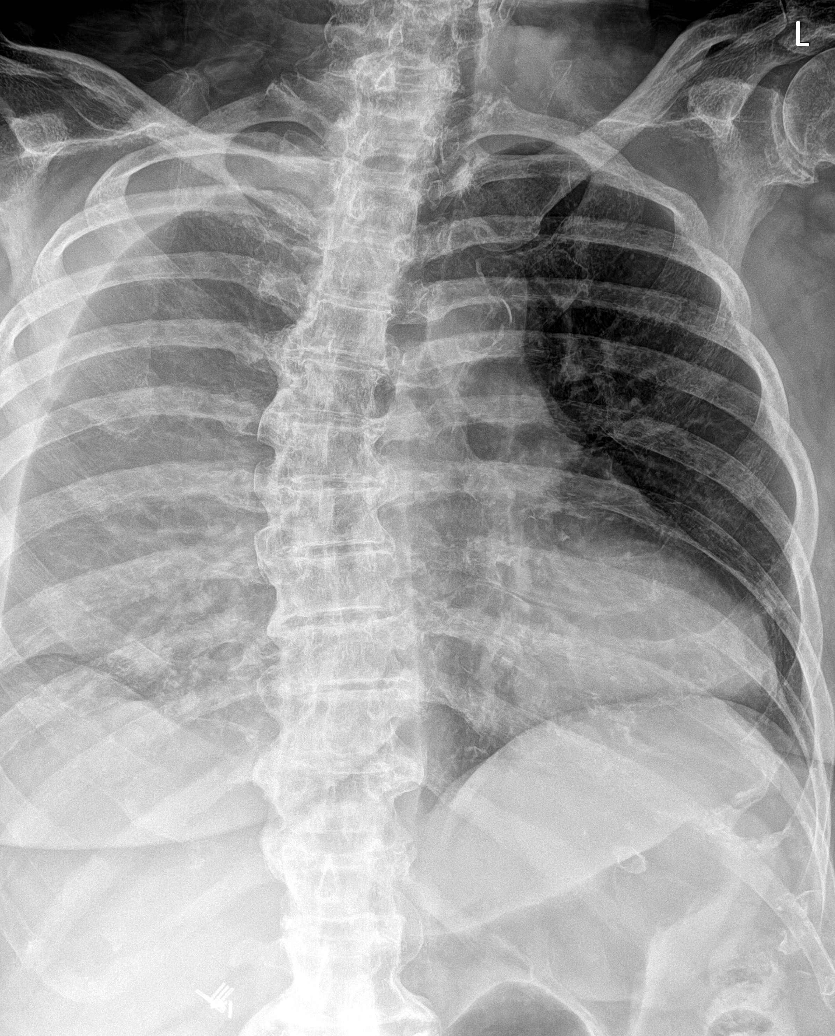

[rib ap (2 of 2)]
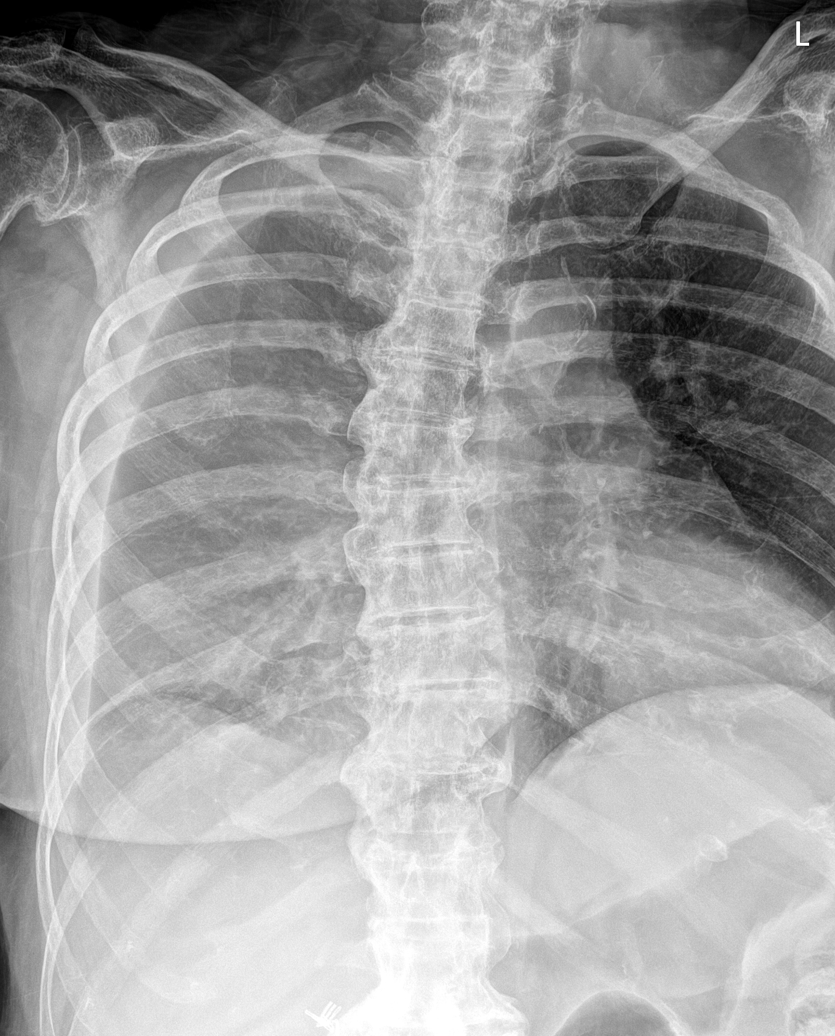

[rib obl (1 of 2)]
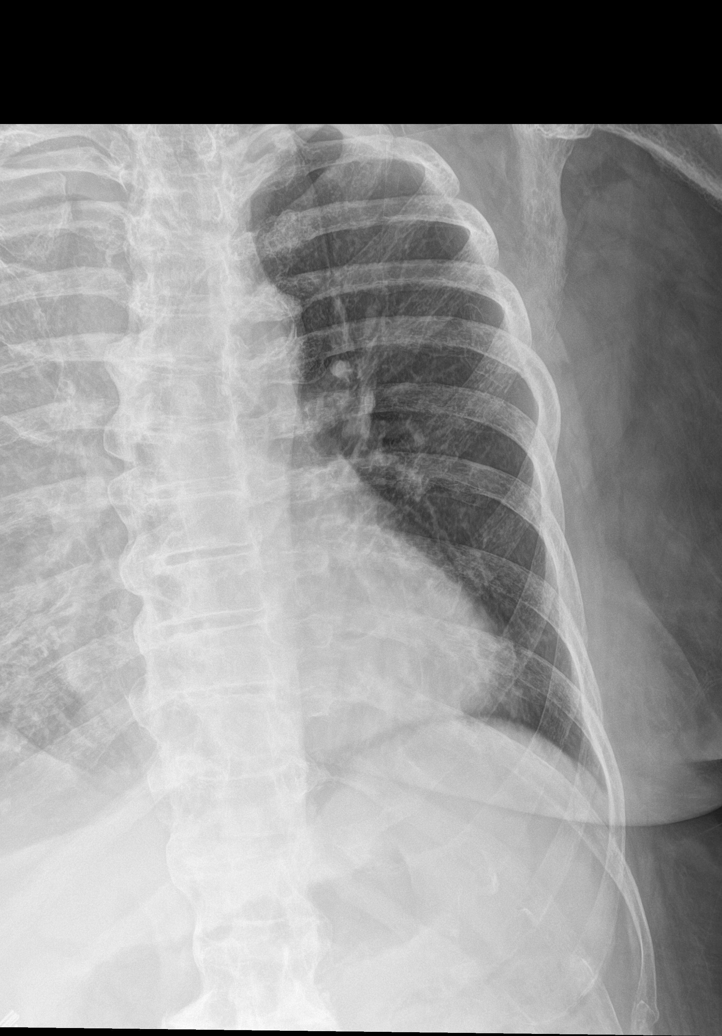

[rib obl (2 of 2)]
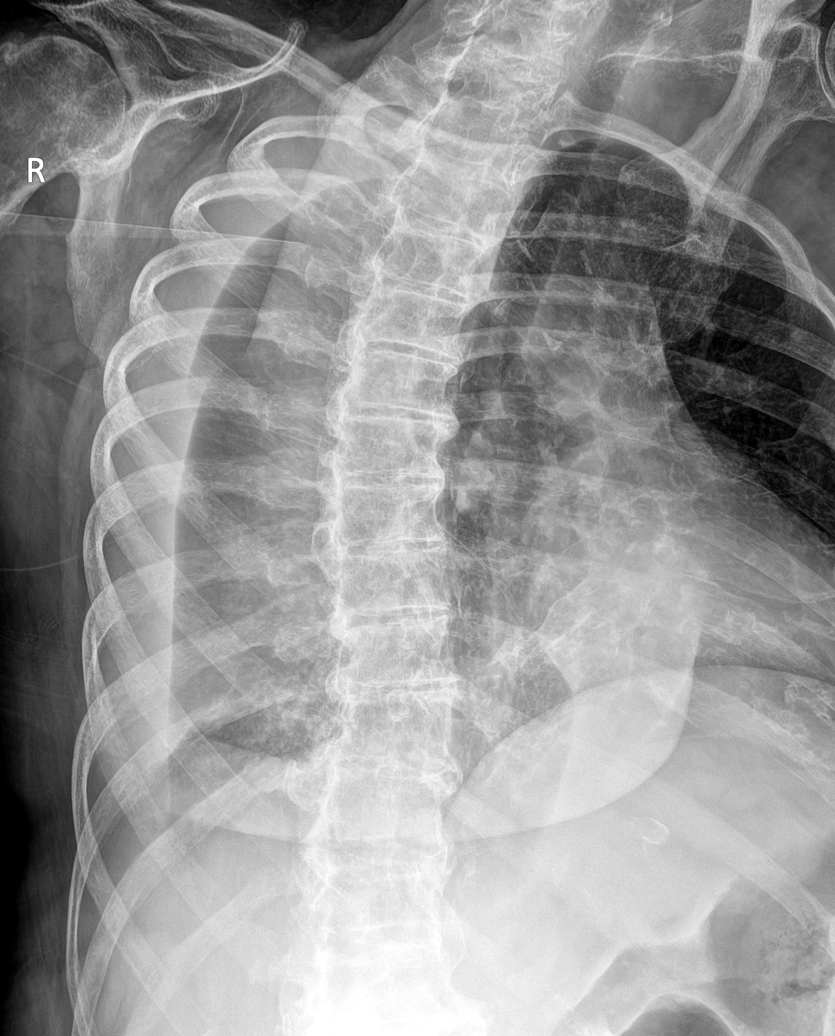

[chest ap]
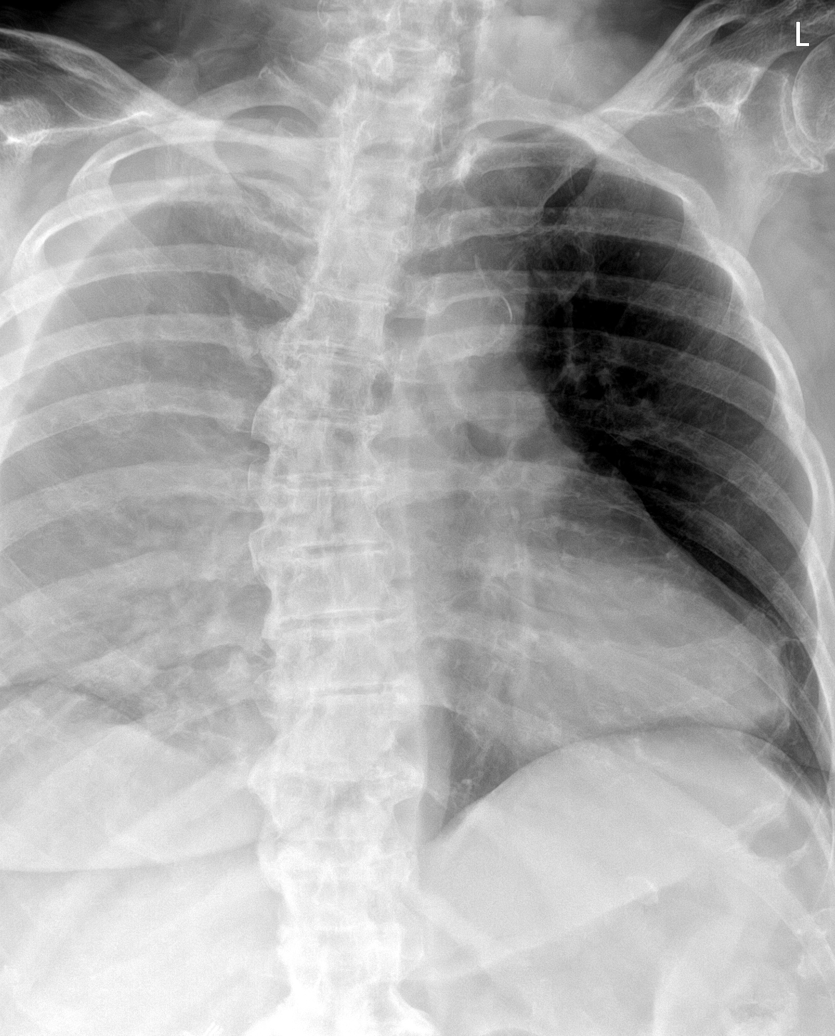

[5 of 5 positions shown; findings below may reference images not displayed]

FINDINGS: No fracture or other bone lesions are seen involving the ribs. Right
pleural effusion at least moderate in size. No pneumothorax. Heart
size and mediastinal contours are within normal limits. Aortic
atherosclerosis.
IMPRESSION: 1. No evidence of rib fracture.
2. Right pleural effusion at least moderate in size. This is of
unknown acuity.

## 2020-07-31 IMAGING — CT CT CHEST W/O CM
2 of 4 series · 14 of 36 positions shown, 17 images · non-contrast
Comparison: Ribbon thoracic spine radiographs earlier today.

CLINICAL DATA: Fall.  Chest trauma.

EXAM:
CT CHEST WITHOUT CONTRAST
TECHNIQUE: Multidetector CT imaging of the chest was performed following the
standard protocol without IV contrast.

[Series 2: routine chest without · axial · non-contrast · 0.77mm/px · z∈[-813,-547]mm · 11 of 159 slices shown, 14 images]
[im 13/159  mediastinal]
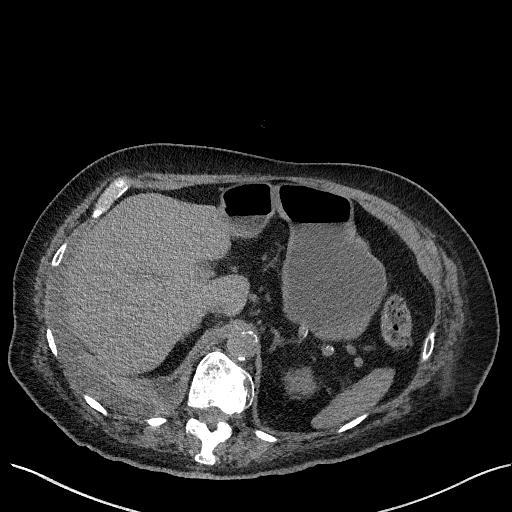
[im 13/159  lung]
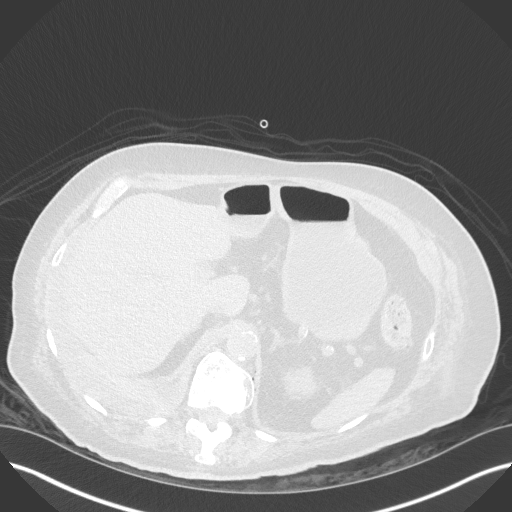
[im 25/159  lung]
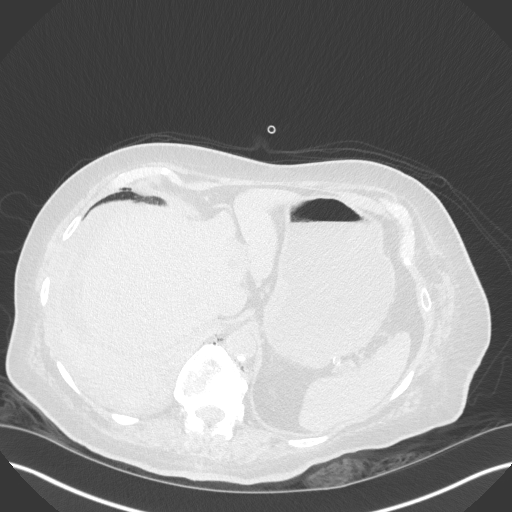
[im 37/159  lung]
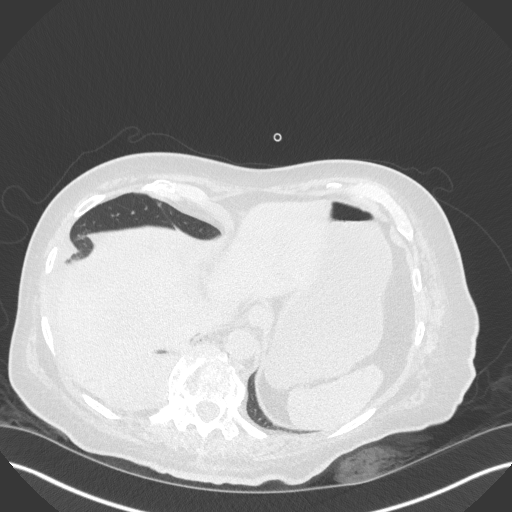
[im 49/159  lung]
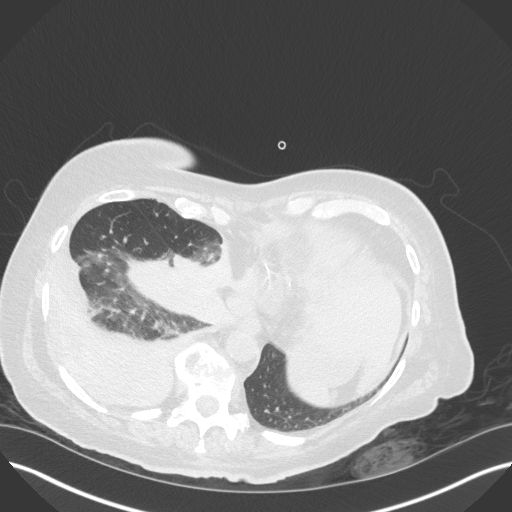
[im 61/159  mediastinal]
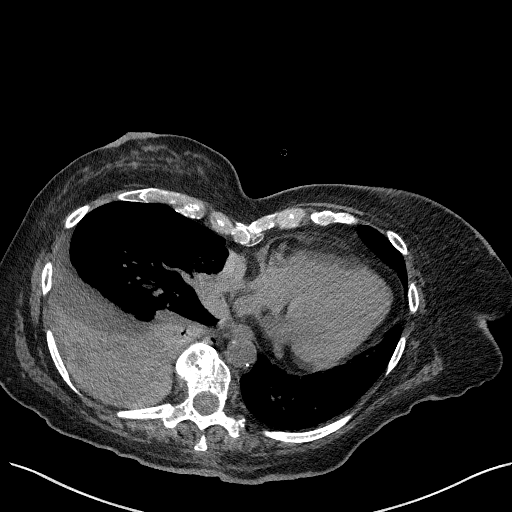
[im 61/159  lung]
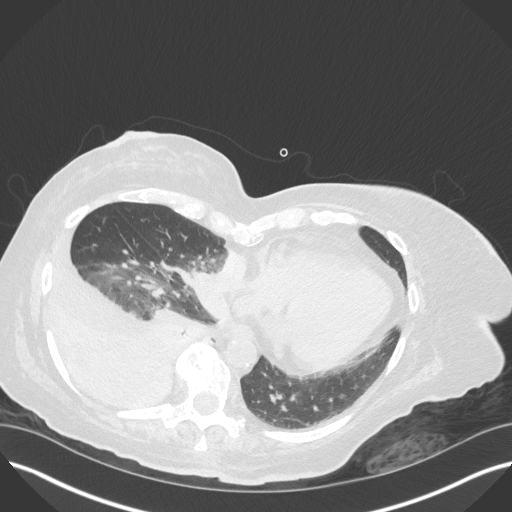
[im 86/159  lung]
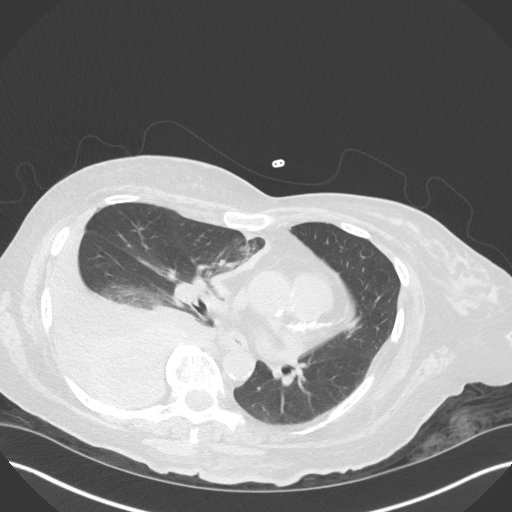
[im 98/159  lung]
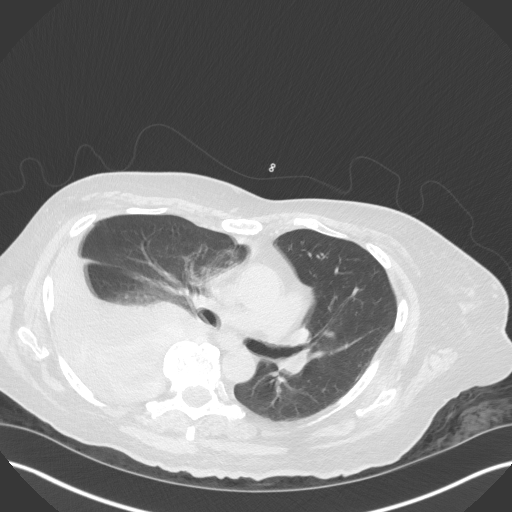
[im 110/159  lung]
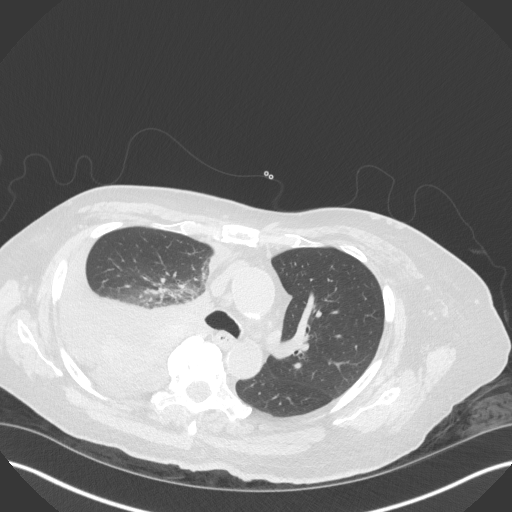
[im 122/159  mediastinal]
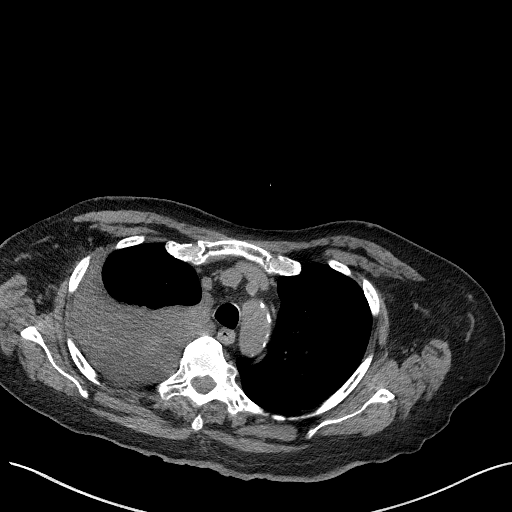
[im 122/159  lung]
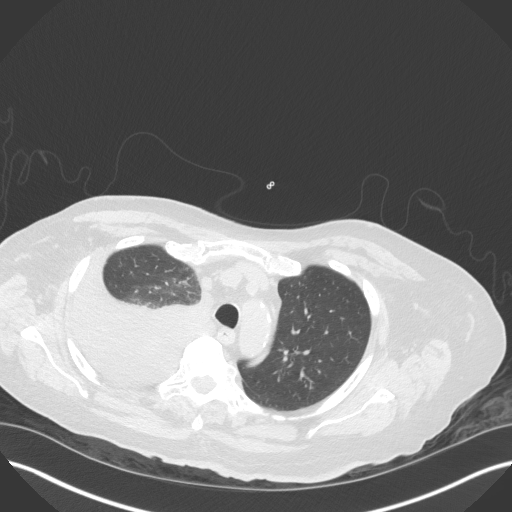
[im 134/159  lung]
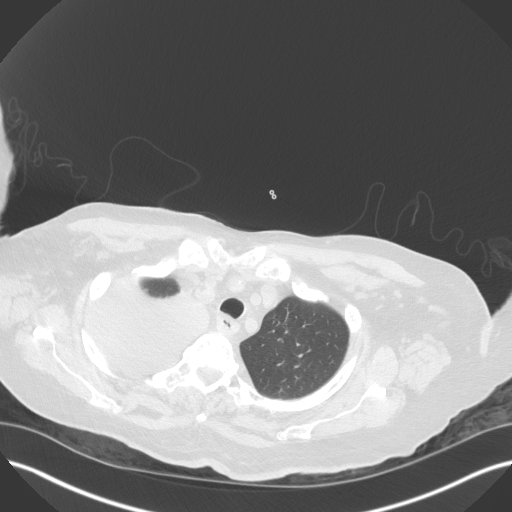
[im 146/159  lung]
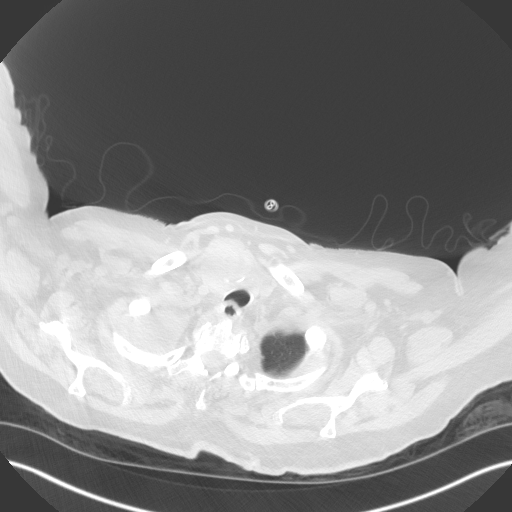

[Series 5: coronal · coronal · 0.66mm/px · 3 of 119 slices shown]
[im 24/119  lung]
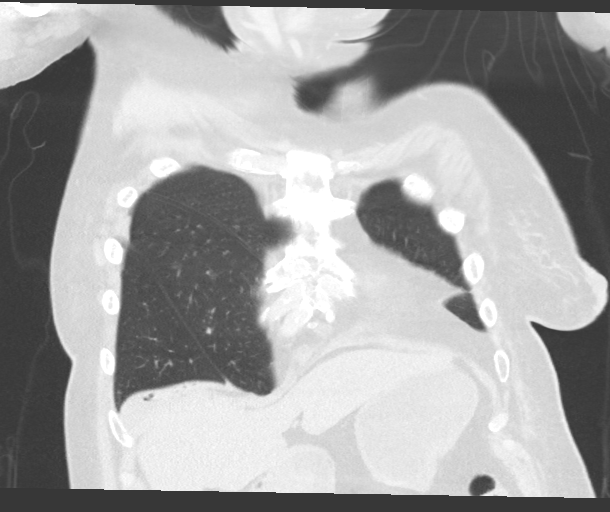
[im 48/119  lung]
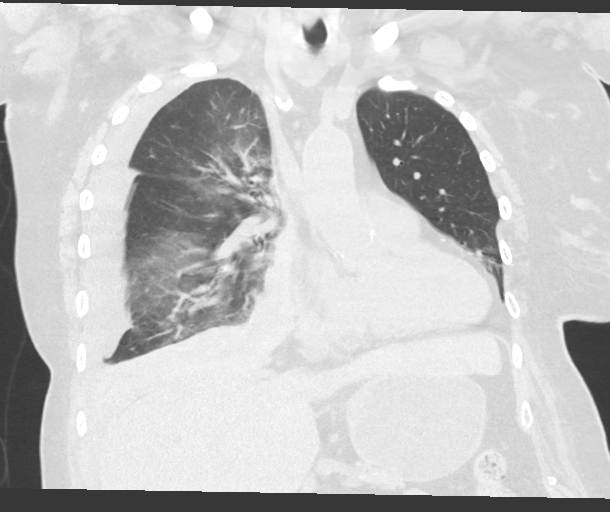
[im 71/119  lung]
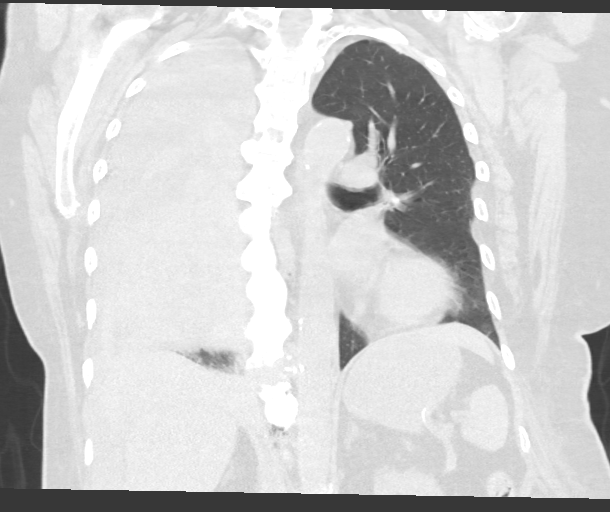

[14 of 36 positions shown; findings below may reference images not displayed]

FINDINGS: Cardiovascular: Moderate aortic atherosclerosis. No periaortic
stranding to suggest injury. There are coronary artery
calcifications. Heart is normal in size, slightly displaced into the
left chest. No pericardial effusion.

Mediastinum/Nodes: Scattered small mediastinal lymph nodes, not
enlarged by size criteria. No esophageal wall thickening.
Heterogeneously enlarged right lobe of the thyroid gland with
scattered curvilinear calcifications. Included left thyroid lobe is
normal.

Lungs/Pleura: Moderate to large right pleural effusion,
heterogeneous density. Areas of increased density within the pleural
fluid may represent hemorrhage, collapsed lung or less likely
masses. Small amount of pleural fluid appears loculated and tracks
anteromedially. No pneumothorax. Left lung is clear. No left pleural
effusion.

Upper Abdomen: No definite free fluid in the upper abdomen.
Diverticulosis at the splenic flexure of the colon. Cholecystectomy.

Musculoskeletal: Bones are diffusely under mineralized. Diffuse
thoracic ankylosis. Displaced fracture through T10 vertebral body
involves the anterior third, extend into the T9-T10 disc space, and
is displaced anteriorly. Fracture extends through anterior
osteophytes as well as the anterior superior aspect of T11 vertebral
body. No convincing extension to involve the posterior elements.
There is adjacent paravertebral hemorrhage and small volume of gas,
likely related to escaped vacuum phenomenon. No additional thoracic
spine fracture. Diffuse ankylosis with flowing anterior osteophytes.
No acute fracture of the ribs, included clavicles or shoulder
girdles. No sternal fracture.
IMPRESSION: 1. Extension injury of the thoracic spine with displaced T10
vertebral body fracture extending into the T9-T10 disc space and
anterior vertebral body. Fracture extends through anterior
osteophytes as well as the anterior superior aspect of T11 vertebral
body. No convincing extension to involve the posterior elements.
There is adjacent paravertebral hemorrhage and small volume of gas,
likely related to escaped vacuum phenomenon. Recommend spine
consultation.
2. Moderate to large right pleural effusion, heterogeneous density.
Areas of increased density within the pleural fluid may represent
hemorrhage, collapsed lung or less likely masses. Recommend
continued radiographic follow-up to ensure resolution. No
pneumothorax or rib fractures.
3. Heterogeneously enlarged right lobe of the thyroid gland with
scattered curvilinear calcifications. Recommend thyroid US (ref: [HOSPITAL]. [DATE]): 143-50).

Aortic Atherosclerosis ([V4]-[V4]).

## 2020-07-31 IMAGING — CT CT CERVICAL SPINE W/O CM
3 of 4 series · 9 of 33 positions shown, 11 images · non-contrast
Comparison: None.

CLINICAL DATA: Recent fall from chair, initial encounter

EXAM:
CT HEAD WITHOUT CONTRAST
CT CERVICAL SPINE WITHOUT CONTRAST
TECHNIQUE: Multidetector CT imaging of the head and cervical spine was
performed following the standard protocol without intravenous
contrast. Multiplanar CT image reconstructions of the cervical spine
were also generated.

[Series 5: cor bone · coronal · 0.26mm/px · 3 of 58 slices shown]
[im 15/58  bone]
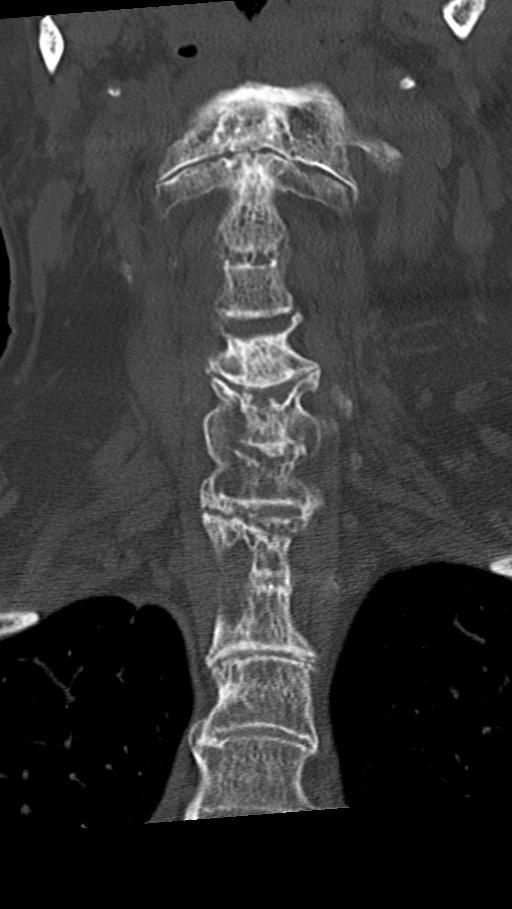
[im 24/58  bone]
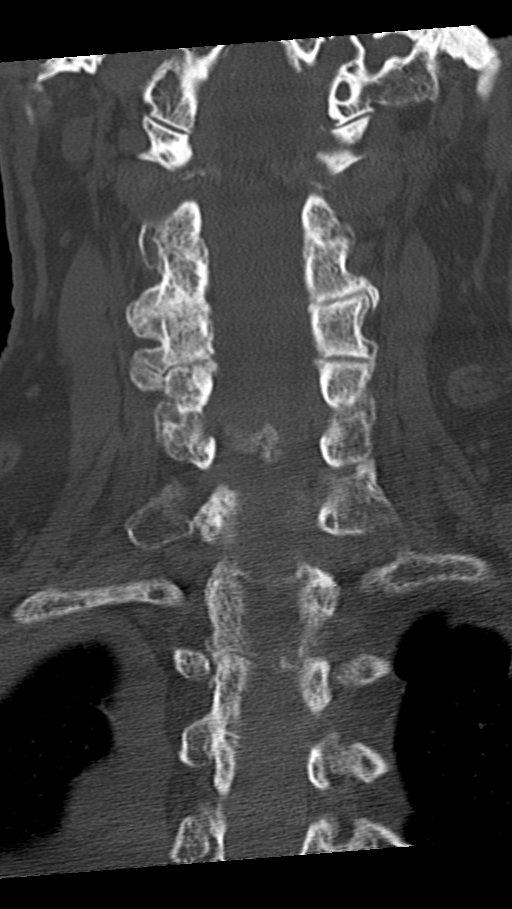
[im 34/58  bone]
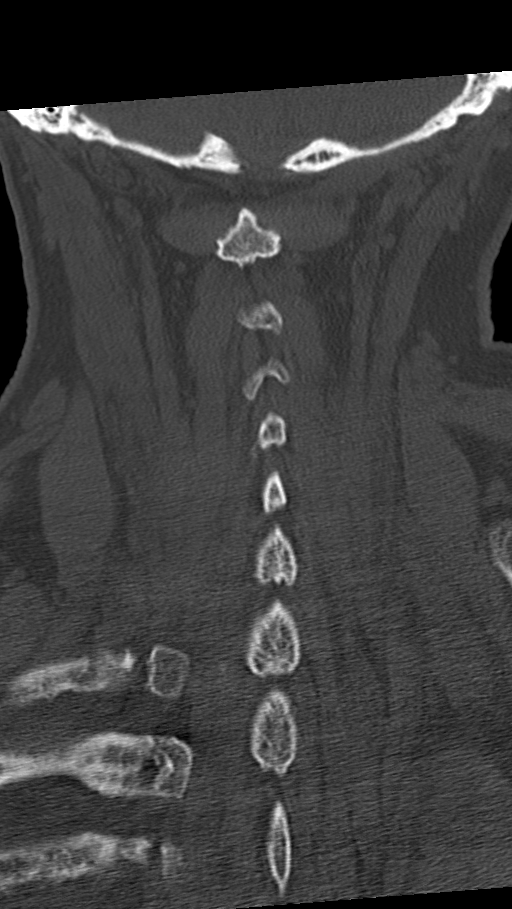

[Series 6: sag bone · sagittal · 0.22mm/px · 5 of 67 slices shown, 6 images]
[im 23/67  bone]
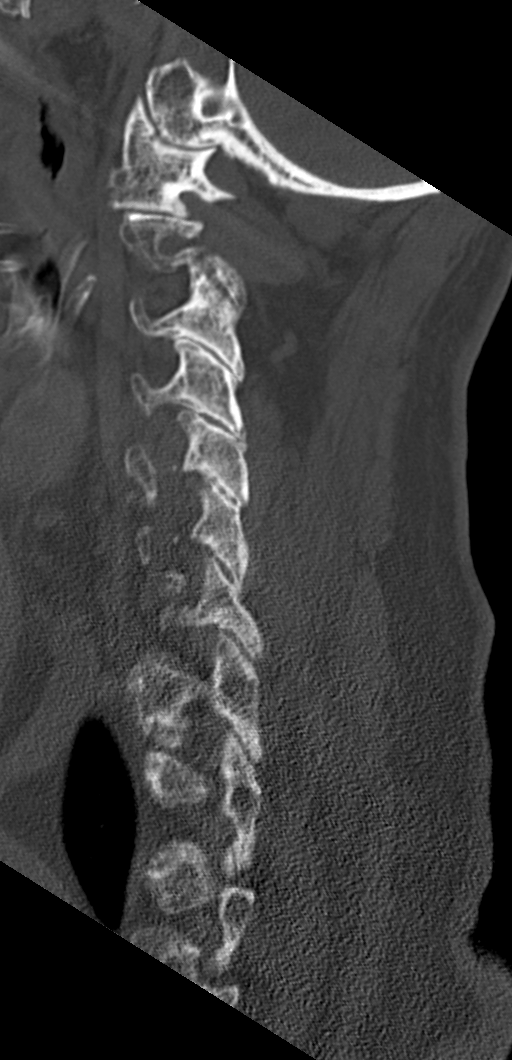
[im 28/67  bone]
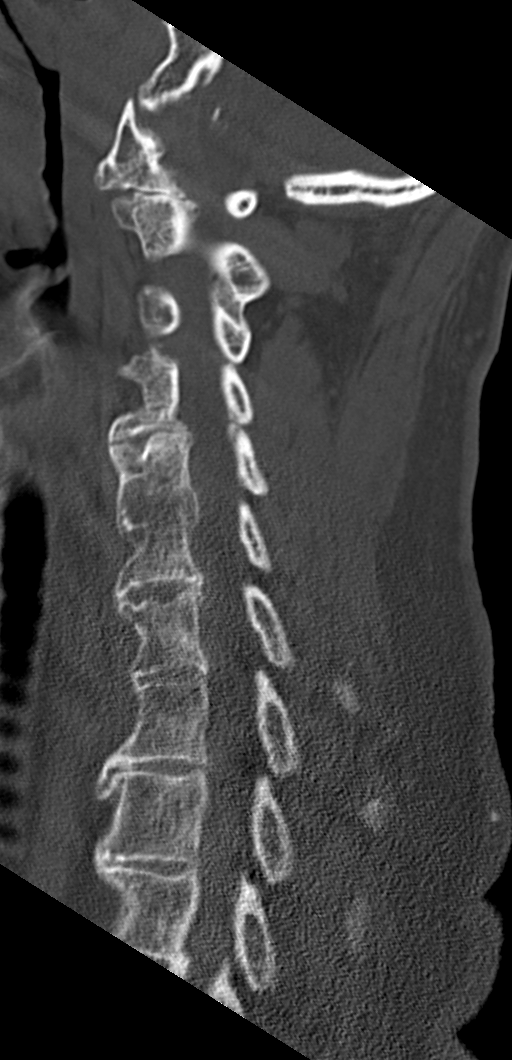
[im 34/67  soft-tissue]
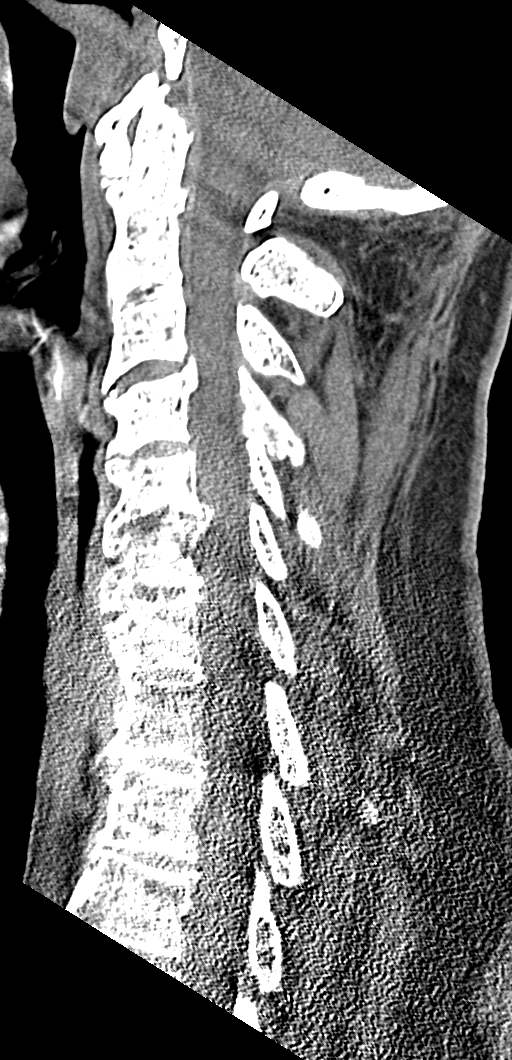
[im 34/67  bone]
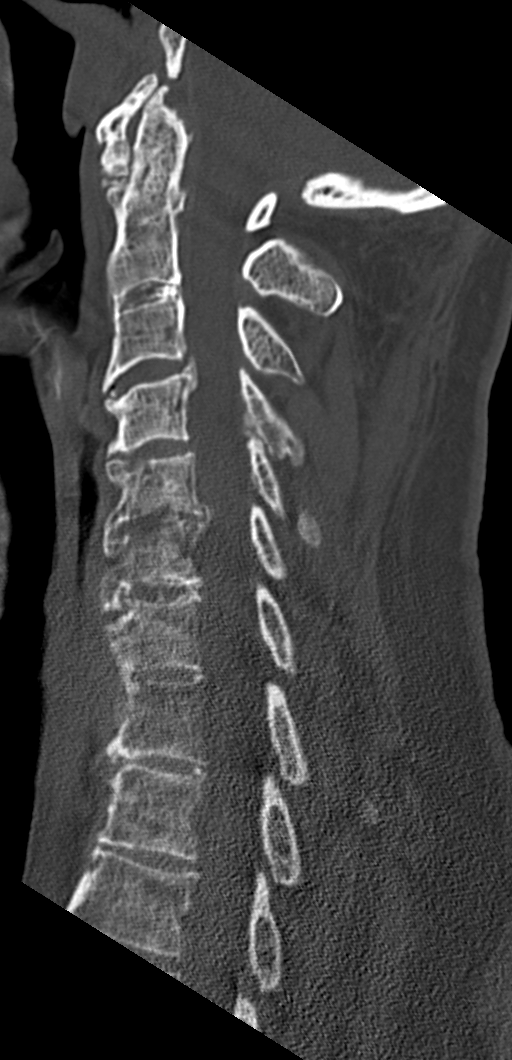
[im 39/67  bone]
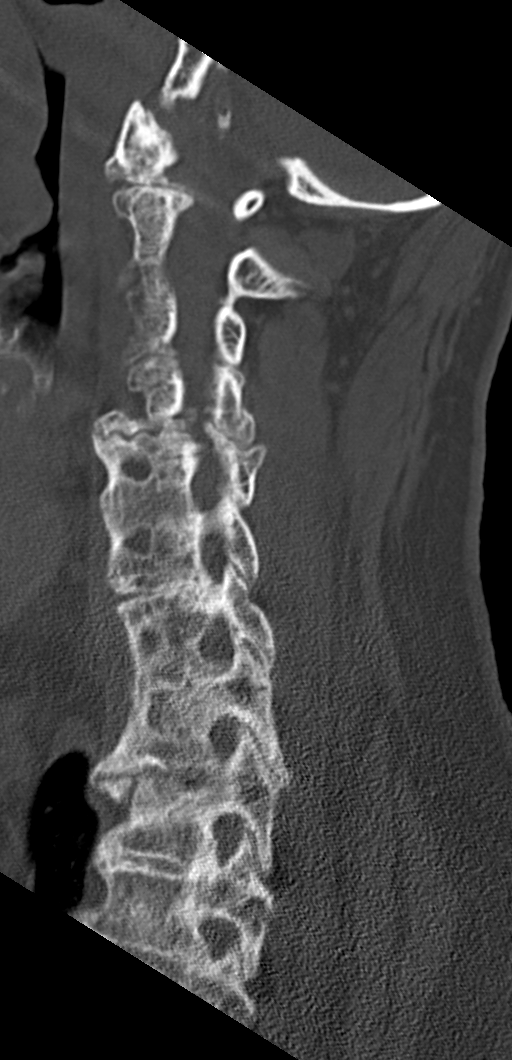
[im 45/67  bone]
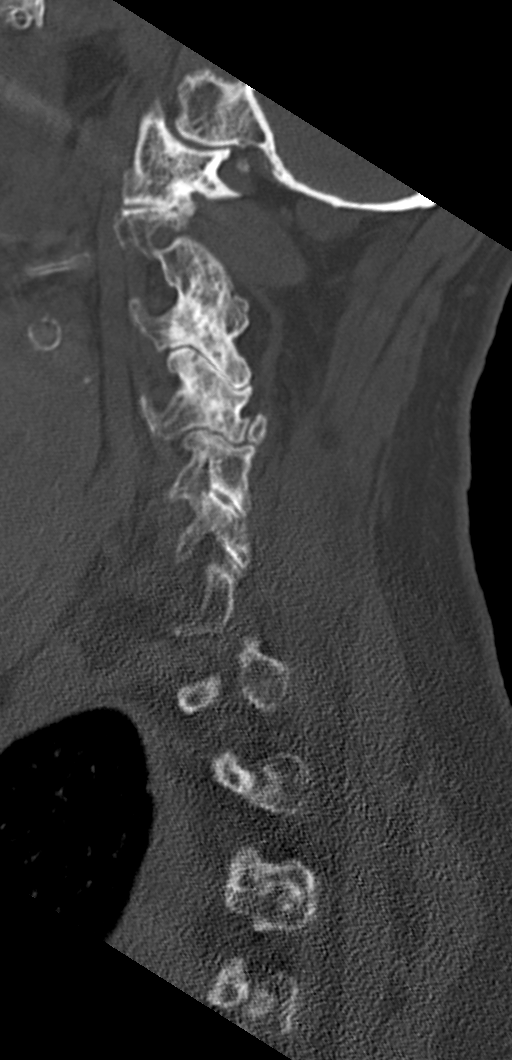

[Series 7: orthogonal axials · axial · 0.21mm/px · z∈[-297,-297]mm · 1 of 104 slices shown, 2 images]
[im 59/104  soft-tissue]
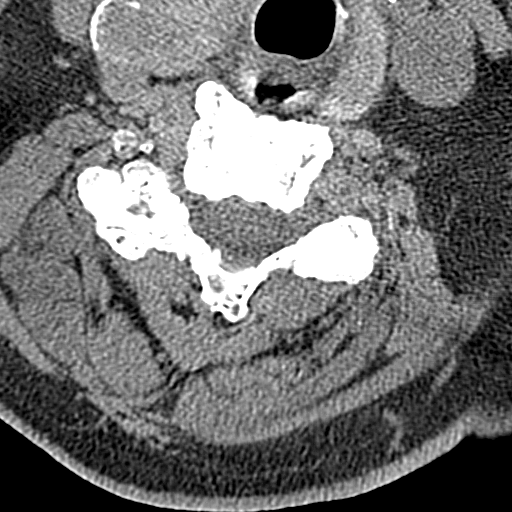
[im 59/104  bone]
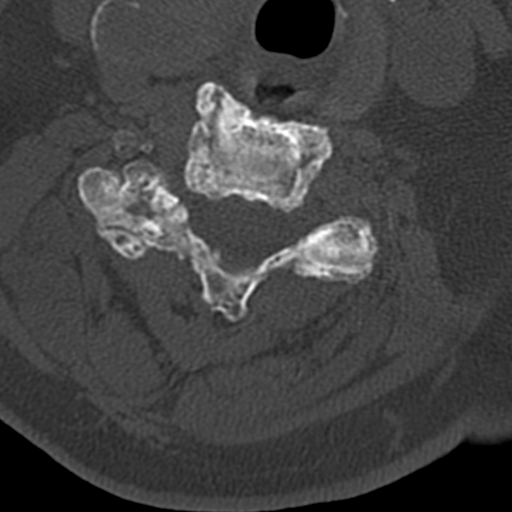

[9 of 33 positions shown; findings below may reference images not displayed]

FINDINGS: CT HEAD FINDINGS

Brain: No evidence of acute infarction, hemorrhage, hydrocephalus,
extra-axial collection or mass lesion/mass effect. Calcification is
noted in the midportion of the pons. Mild chronic white matter
ischemic changes are noted.

Vascular: No hyperdense vessel or unexpected calcification.

Skull: Normal. Negative for fracture or focal lesion.

Sinuses/Orbits: No acute finding.

Other: Mild parietal scalp hematoma is noted in the vertex on the
left.

CT CERVICAL SPINE FINDINGS

Alignment: Mild loss of normal cervical lordosis is noted likely
related to muscular spasm

Skull base and vertebrae: 7 cervical segments are well visualized.
Vertebral body height is well maintained. Multilevel osteophytic
changes and facet hypertrophic changes are noted. No acute fracture
or acute facet abnormality is seen.

Soft tissues and spinal canal: Surrounding soft tissue structures
show vascular calcifications. Enlargement of the right lobe of the
thyroid is noted with evidence of calcifications and a nodule which
measures at least 2.5 cm. No other soft tissue abnormality is noted.

Upper chest: Visualized lung apices demonstrate evidence of a
right-sided pleural effusion. This is of uncertain chronicity. No
rib abnormality is noted.

Other: None
IMPRESSION: CT of the head: Left parietal scalp hematoma. No acute intracranial
abnormality is noted.

CT of the cervical spine: Multilevel degenerative changes without
acute bony abnormality.

Right-sided pleural effusion.

Prominent right lobe of the thyroid with at least 1 nodule within.
Recommend nonemergent thyroid US (ref: [HOSPITAL]. [[facility]]

## 2020-07-31 IMAGING — CT CT HEAD W/O CM
4 series · 16 of 47 positions shown, 18 images · non-contrast
Comparison: None.

CLINICAL DATA: Recent fall from chair, initial encounter

EXAM:
CT HEAD WITHOUT CONTRAST
CT CERVICAL SPINE WITHOUT CONTRAST
TECHNIQUE: Multidetector CT imaging of the head and cervical spine was
performed following the standard protocol without intravenous
contrast. Multiplanar CT image reconstructions of the cervical spine
were also generated.

[Series 2: head bone · axial · 0.40mm/px · z∈[-196,-164]mm · 3 of 79 slices shown]
[im 8/79  bone]
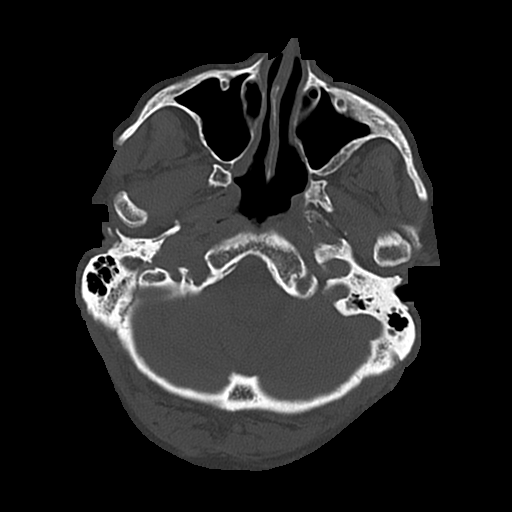
[im 16/79  bone]
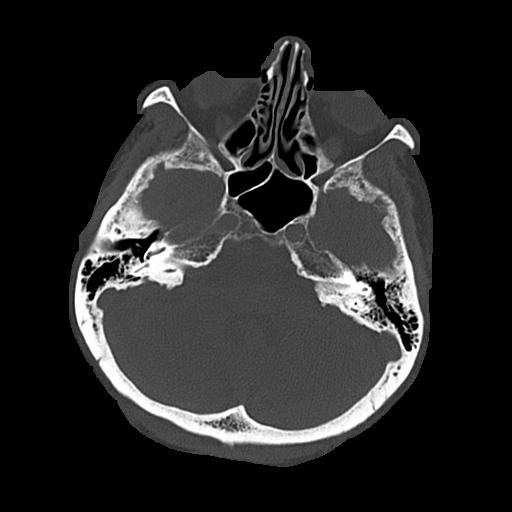
[im 24/79  bone]
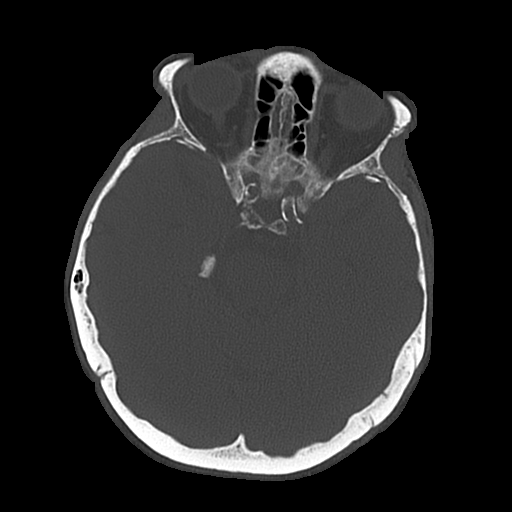

[Series 3: head wo · axial · 0.40mm/px · z∈[-195,-75]mm · 7 of 32 slices shown, 9 images]
[im 4/32  brain]
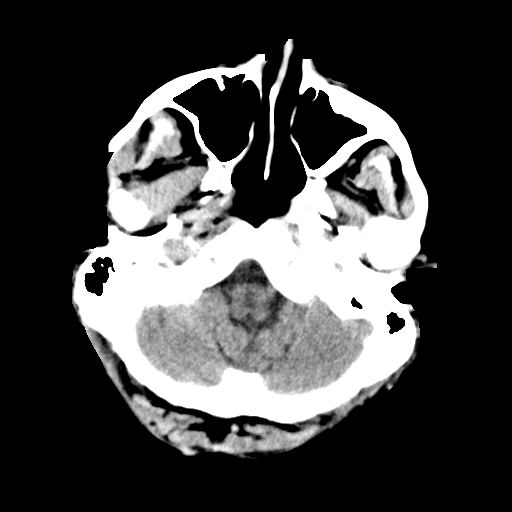
[im 4/32  bone]
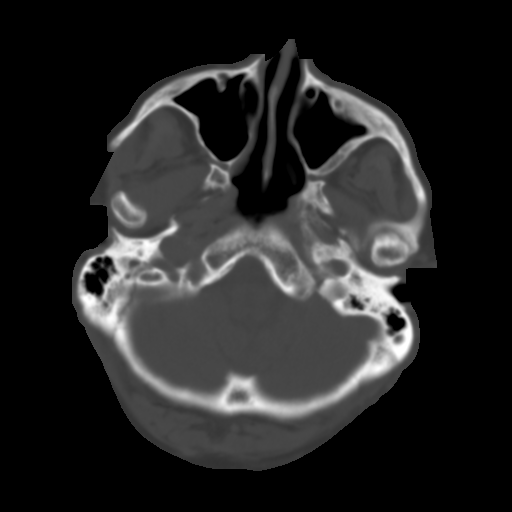
[im 8/32  brain]
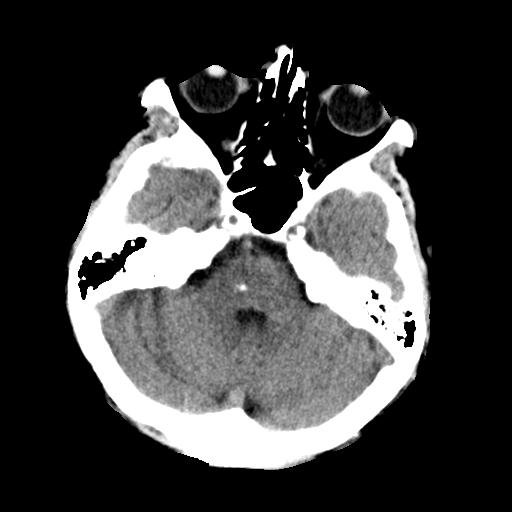
[im 12/32  brain]
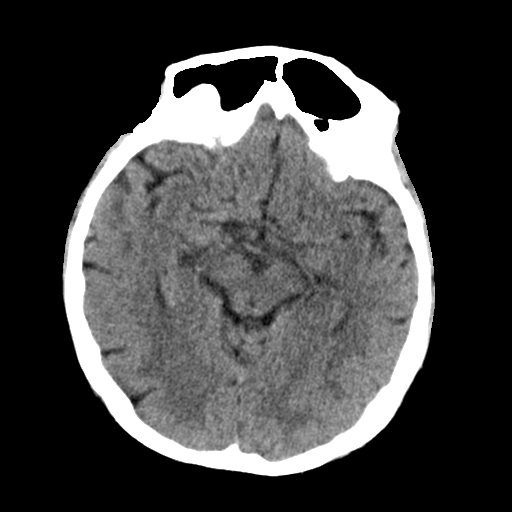
[im 16/32  brain]
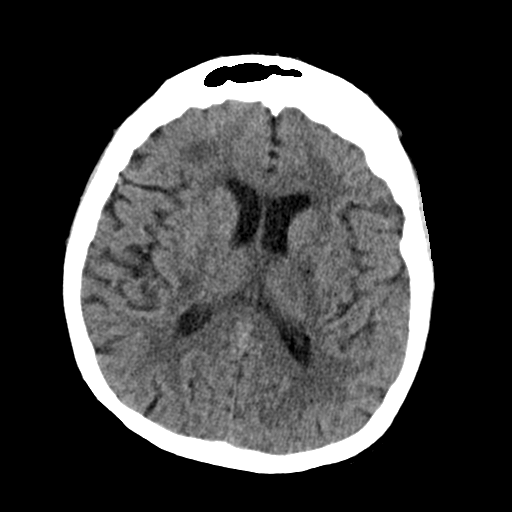
[im 20/32  brain]
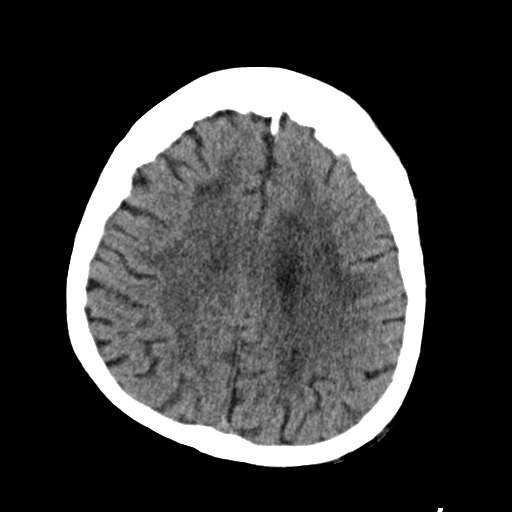
[im 20/32  bone]
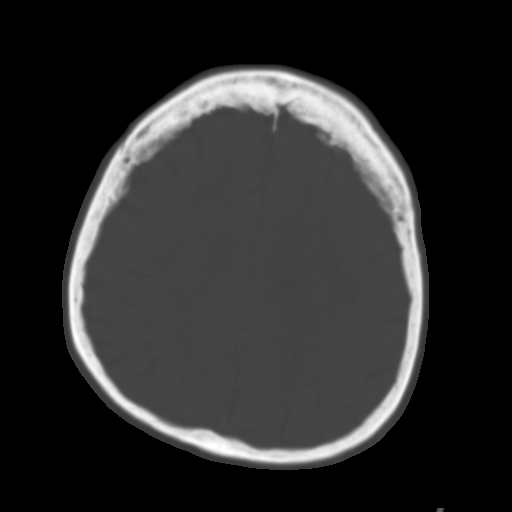
[im 24/32  brain]
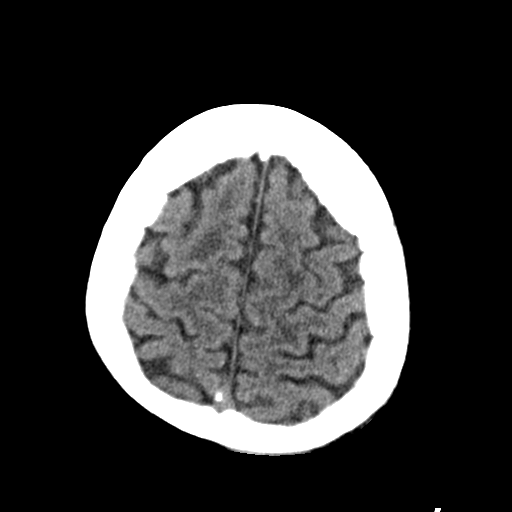
[im 28/32  brain]
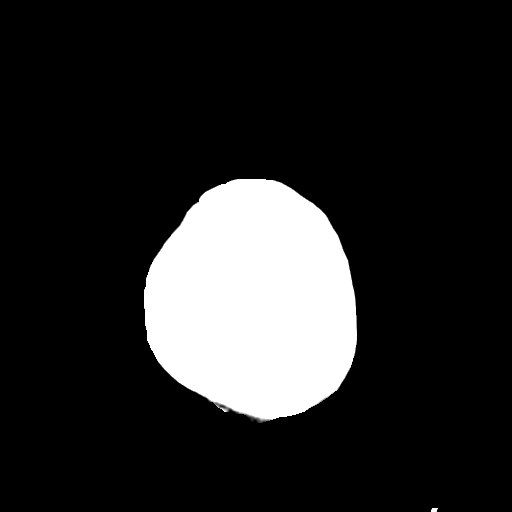

[Series 4: coronal soft · coronal · 0.29mm/px · 3 of 63 slices shown]
[im 23/63  brain]
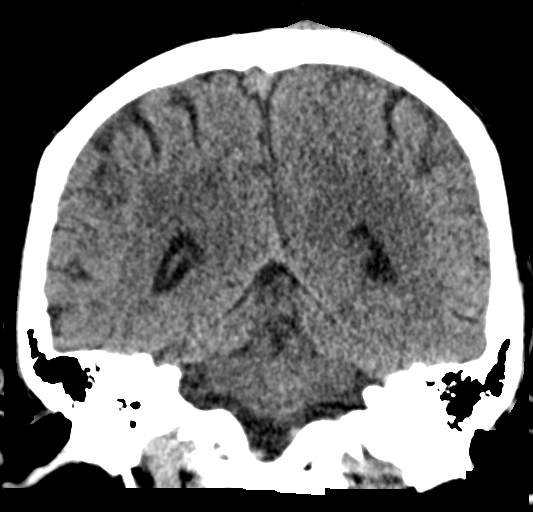
[im 29/63  brain]
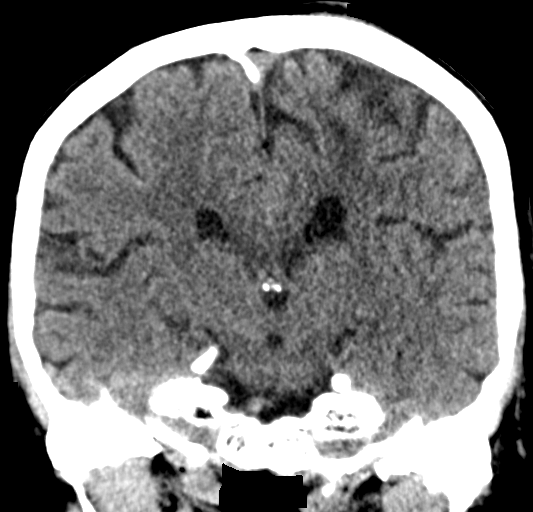
[im 34/63  brain]
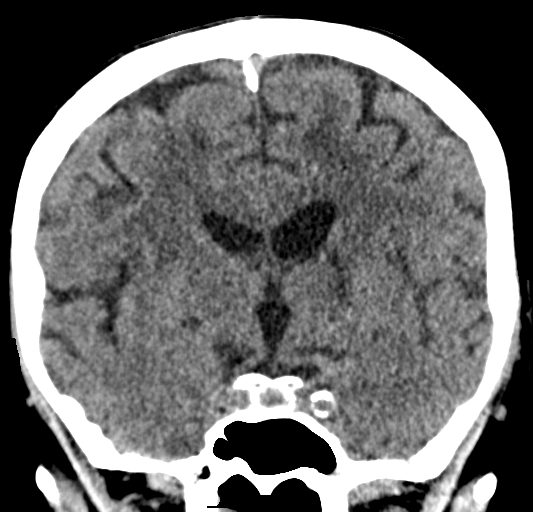

[Series 5: sagittal soft · sagittal · 0.29mm/px · 3 of 52 slices shown]
[im 18/52  brain]
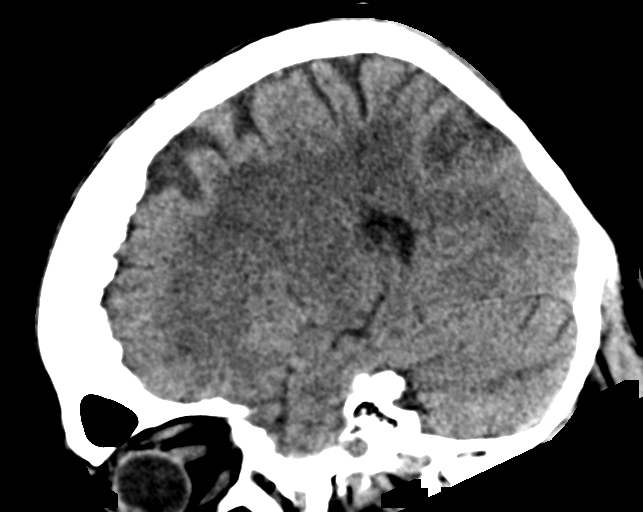
[im 26/52  brain]
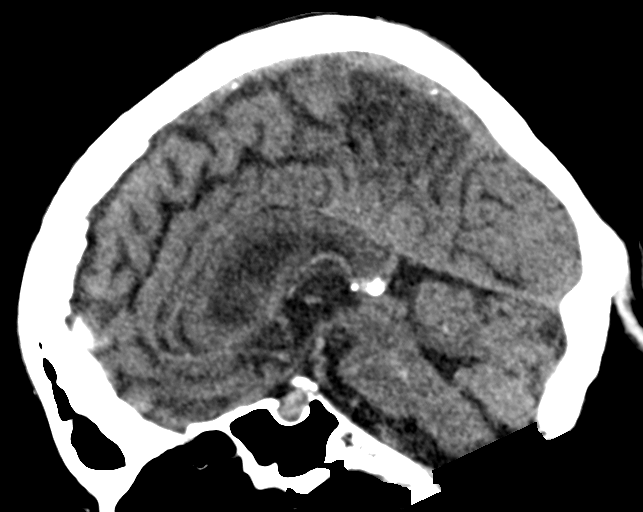
[im 35/52  brain]
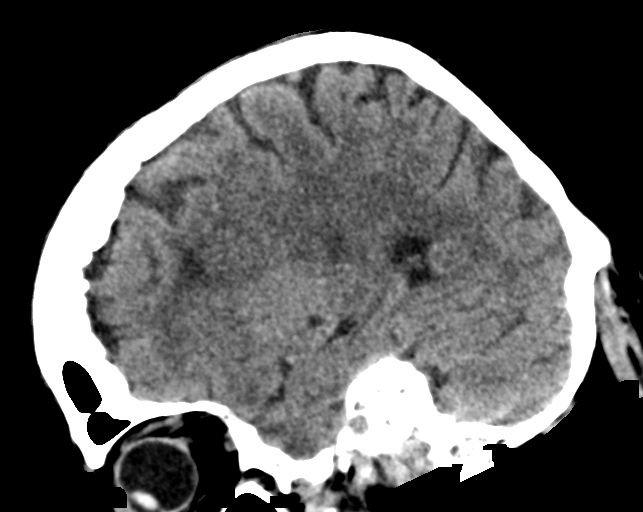

[16 of 47 positions shown; findings below may reference images not displayed]

FINDINGS: CT HEAD FINDINGS

Brain: No evidence of acute infarction, hemorrhage, hydrocephalus,
extra-axial collection or mass lesion/mass effect. Calcification is
noted in the midportion of the pons. Mild chronic white matter
ischemic changes are noted.

Vascular: No hyperdense vessel or unexpected calcification.

Skull: Normal. Negative for fracture or focal lesion.

Sinuses/Orbits: No acute finding.

Other: Mild parietal scalp hematoma is noted in the vertex on the
left.

CT CERVICAL SPINE FINDINGS

Alignment: Mild loss of normal cervical lordosis is noted likely
related to muscular spasm

Skull base and vertebrae: 7 cervical segments are well visualized.
Vertebral body height is well maintained. Multilevel osteophytic
changes and facet hypertrophic changes are noted. No acute fracture
or acute facet abnormality is seen.

Soft tissues and spinal canal: Surrounding soft tissue structures
show vascular calcifications. Enlargement of the right lobe of the
thyroid is noted with evidence of calcifications and a nodule which
measures at least 2.5 cm. No other soft tissue abnormality is noted.

Upper chest: Visualized lung apices demonstrate evidence of a
right-sided pleural effusion. This is of uncertain chronicity. No
rib abnormality is noted.

Other: None
IMPRESSION: CT of the head: Left parietal scalp hematoma. No acute intracranial
abnormality is noted.

CT of the cervical spine: Multilevel degenerative changes without
acute bony abnormality.

Right-sided pleural effusion.

Prominent right lobe of the thyroid with at least 1 nodule within.
Recommend nonemergent thyroid US (ref: [HOSPITAL]. [[facility]]

## 2020-07-31 IMAGING — DX DG LUMBAR SPINE COMPLETE 4+V
5 series · 5 of 5 positions shown · non-contrast
Comparison: None.

CLINICAL DATA: Unwitnessed fall.  Pain.

EXAM:
LUMBAR SPINE - COMPLETE 4+ VIEW

[l-spine ap]
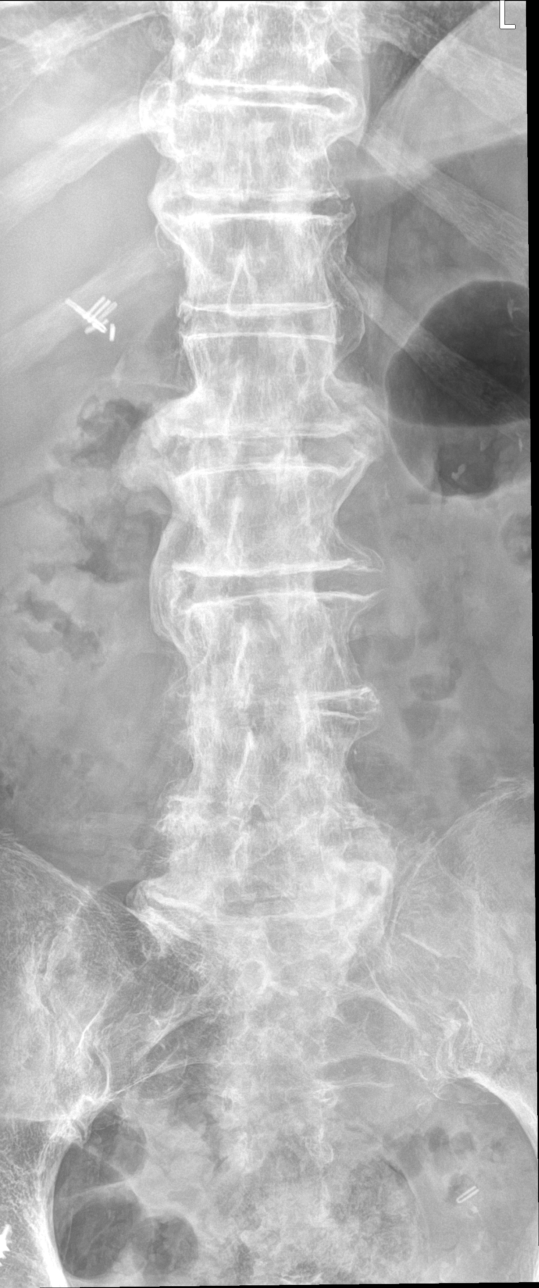

[l-spine obl (1 of 2)]
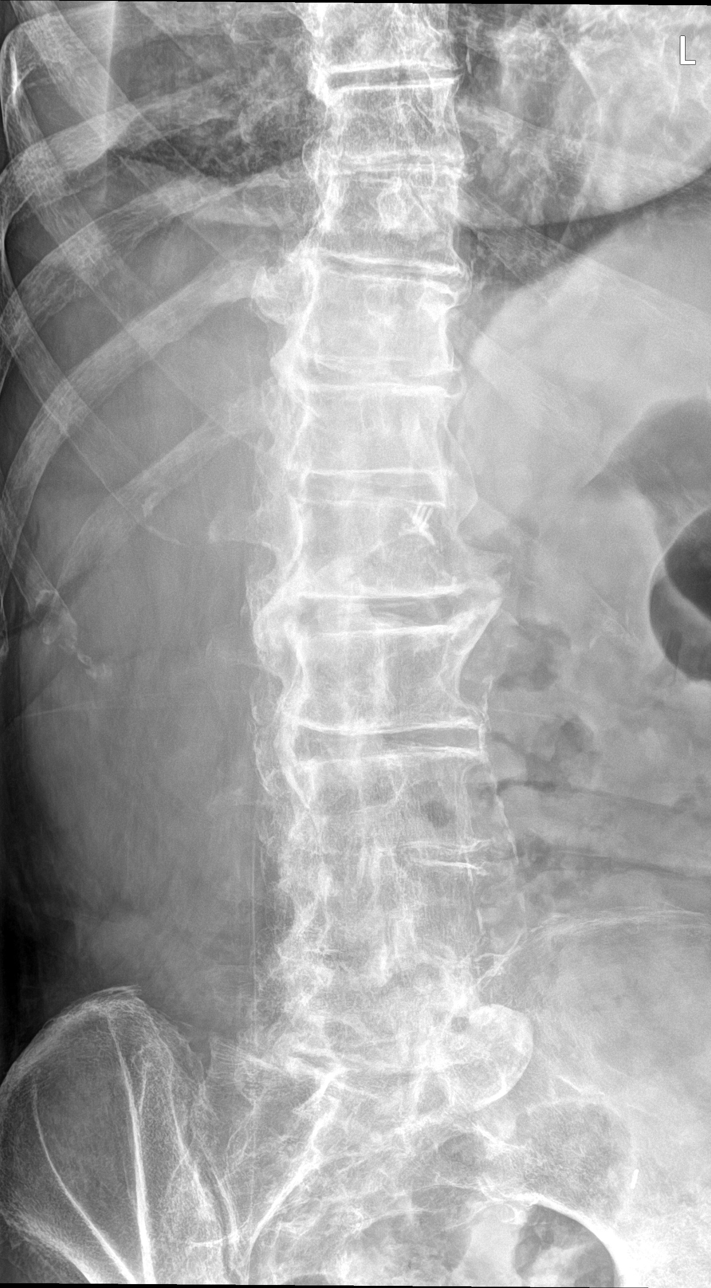

[l-spine obl (2 of 2)]
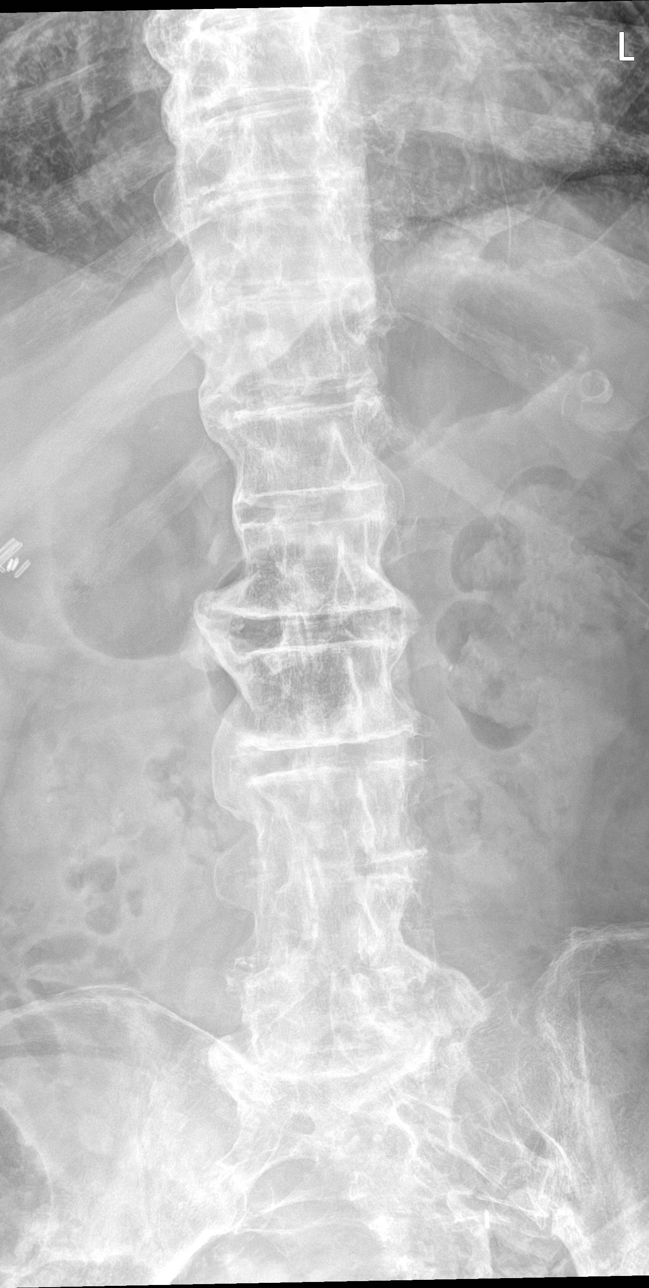

[l-spine lat (1 of 2)]
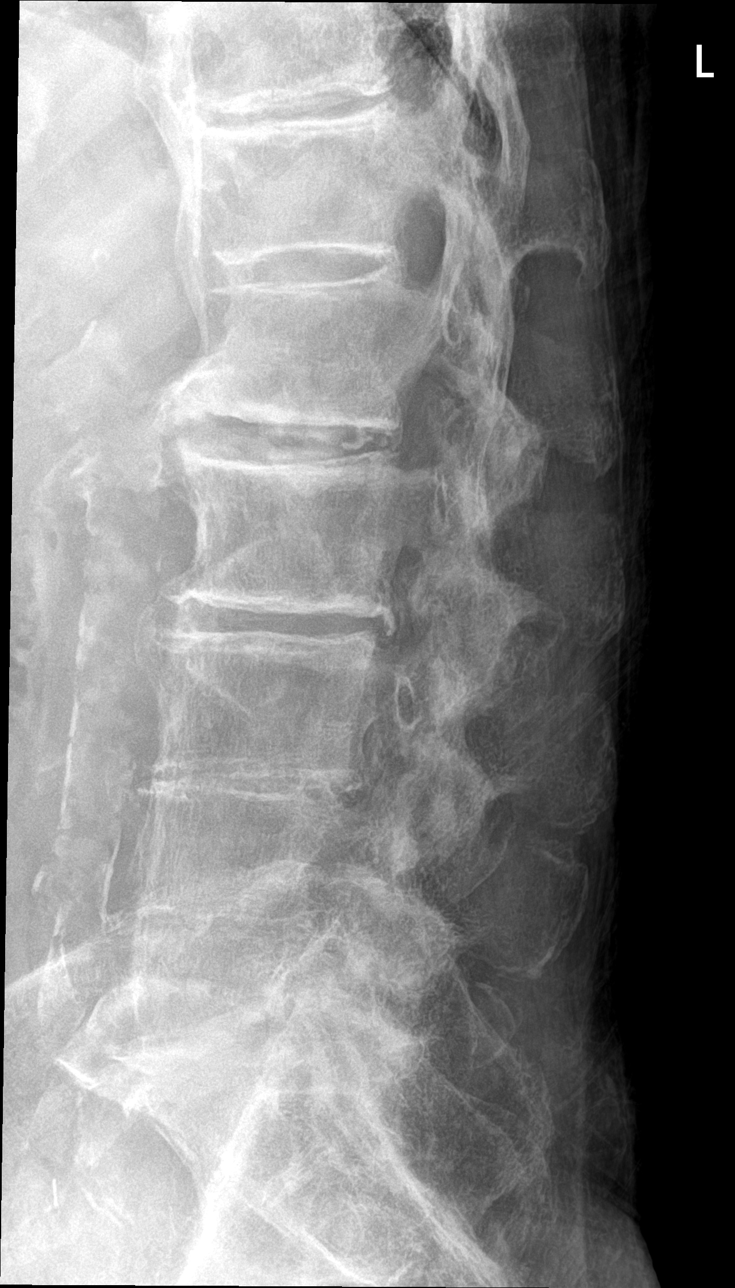

[l-spine lat (2 of 2)]
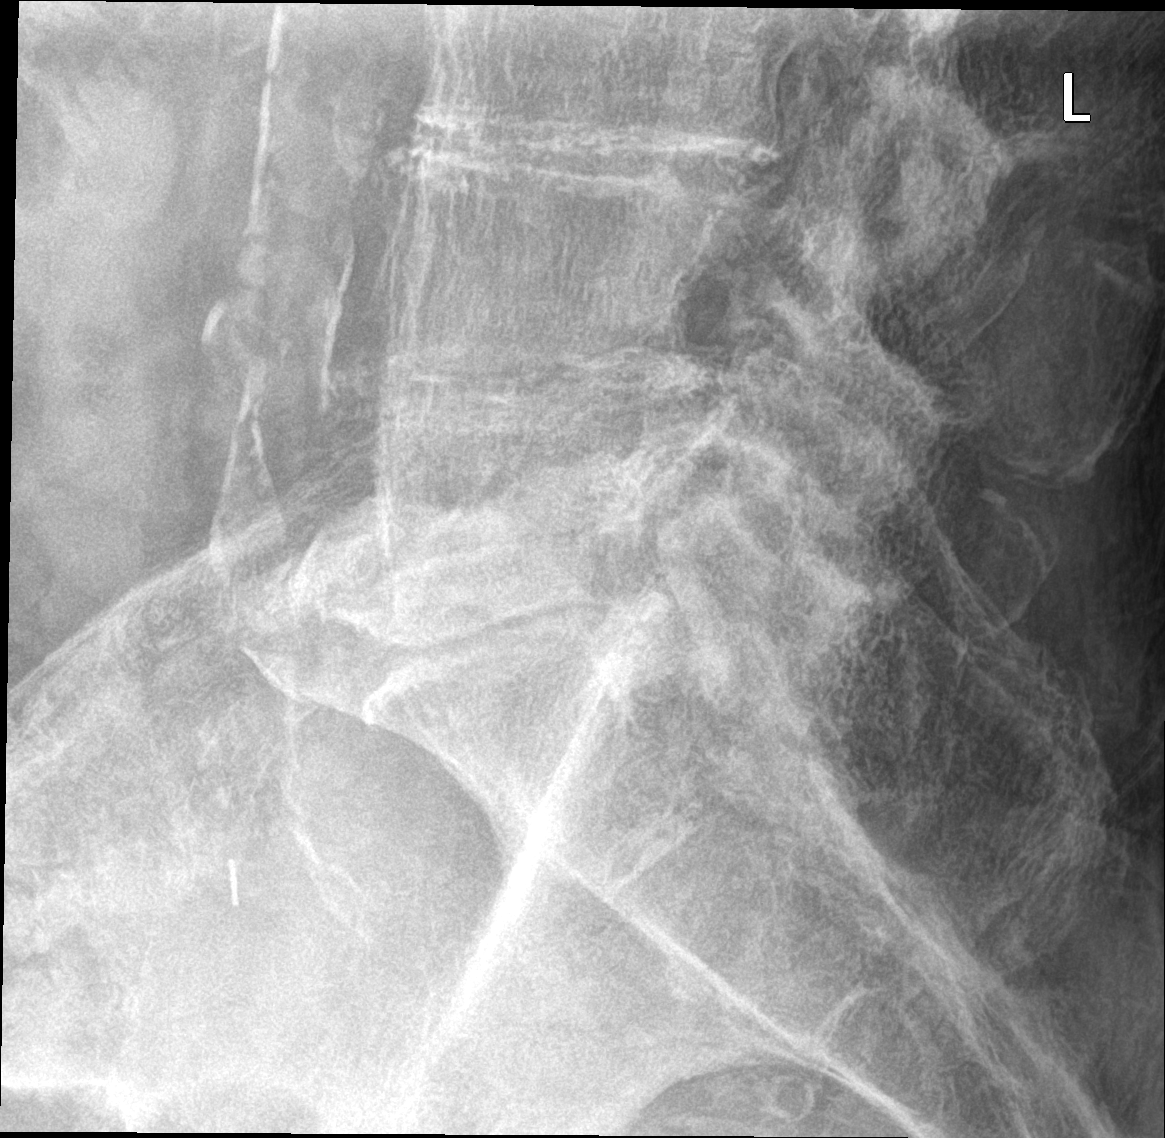

[5 of 5 positions shown; findings below may reference images not displayed]

FINDINGS: Five non-rib-bearing lumbar vertebra. Straightening of normal
lordosis. Near complete disc space loss at L4-L5 with prominent disc
space narrowing at L3-L4 and L5-S1. Endplate spurring with lesser
disc space narrowing at L1-L2 and L2-L3. Multilevel facet
hypertrophy. Vertebral body heights are preserved. No evidence of
fracture. Degenerative change of both sacroiliac joints.
IMPRESSION: 1. No fracture of the lumbar spine.
2. Straightening of normal lordosis may be positioning or muscle
spasm. Multilevel degenerative disc disease and facet hypertrophy.

## 2020-07-31 IMAGING — DX DG THORACIC SPINE 2V
2 series · 2 of 2 positions shown · non-contrast
Comparison: None.

CLINICAL DATA: Unwitnessed fall.  Pain.

EXAM:
THORACIC SPINE 2 VIEWS

[t-spine ap]
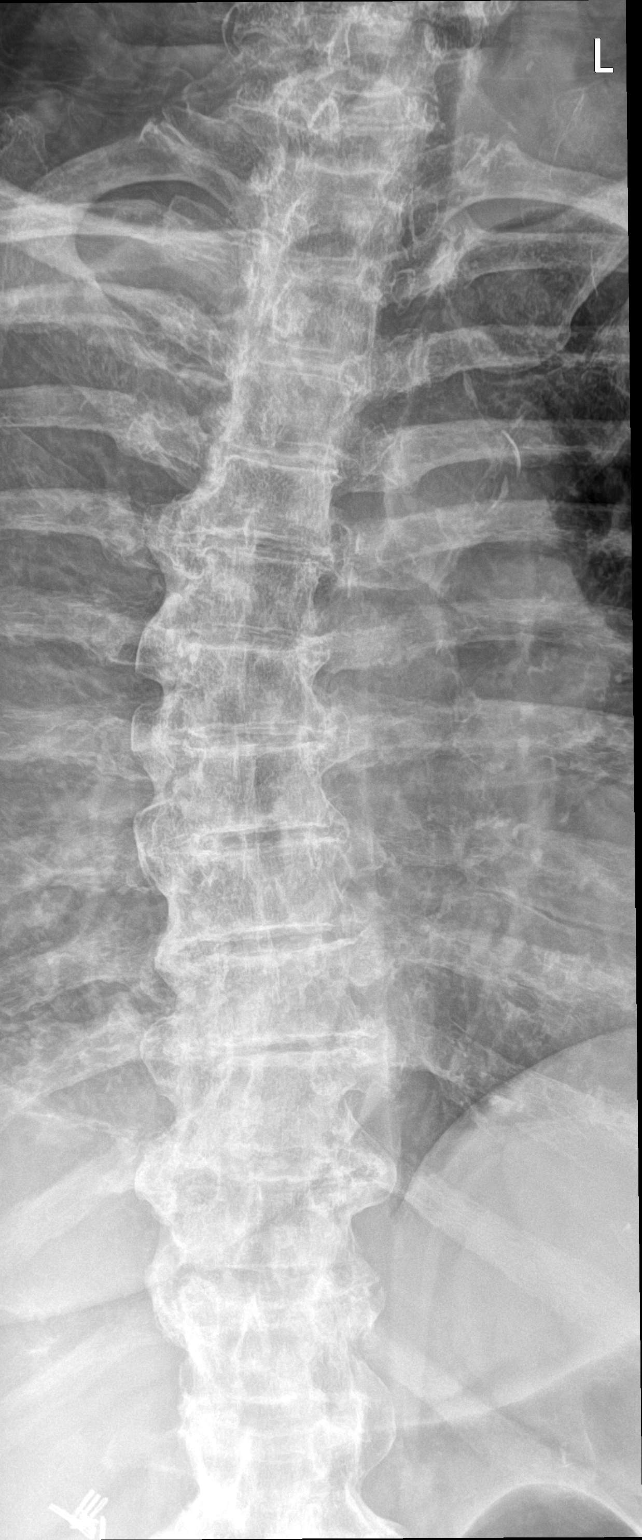

[t-spine lat]
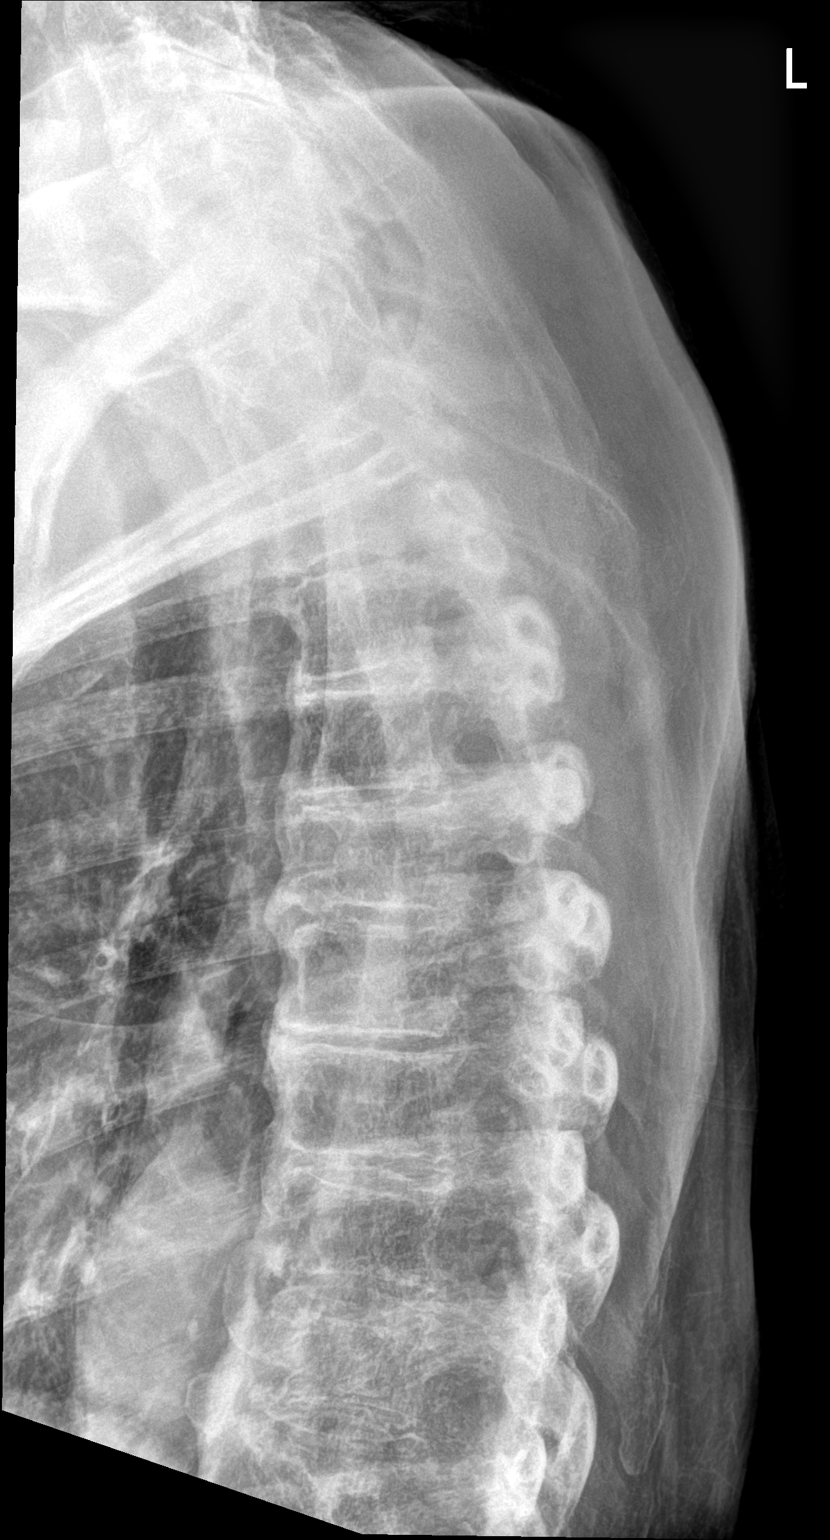

[2 of 2 positions shown; findings below may reference images not displayed]

FINDINGS: Broad-based dextroscoliotic curvature. No evidence of acute fracture
or compression deformity. Flowing anterior osteophytes throughout
the thoracic spine. Minimal disc space narrowing at T10-T11 and
T11-T12. no paravertebral soft tissue abnormality
IMPRESSION: 1. No radiographic evidence of acute fracture of the thoracic spine.
2. Scoliosis and degenerative change.

## 2020-07-31 MED ORDER — SODIUM CHLORIDE 0.9 % IV BOLUS (SEPSIS)
500.0000 mL | Freq: Once | INTRAVENOUS | Status: AC
  Administered 2020-07-31: 500 mL via INTRAVENOUS

## 2020-07-31 MED ORDER — MORPHINE SULFATE (PF) 4 MG/ML IV SOLN
4.0000 mg | Freq: Once | INTRAVENOUS | Status: AC
  Administered 2020-07-31: 4 mg via INTRAVENOUS
  Filled 2020-07-31: qty 1

## 2020-07-31 MED ORDER — LIDOCAINE-EPINEPHRINE 2 %-1:100000 IJ SOLN: 20.0000 mL | Freq: Once | INTRAMUSCULAR | Status: DC

## 2020-07-31 MED ORDER — SODIUM CHLORIDE 0.9 % IV BOLUS
500.0000 mL | Freq: Once | INTRAVENOUS | Status: AC
  Administered 2020-07-31: 500 mL via INTRAVENOUS

## 2020-07-31 MED ORDER — ONDANSETRON HCL 4 MG/2ML IJ SOLN
4.0000 mg | Freq: Once | INTRAMUSCULAR | Status: AC
  Administered 2020-07-31: 4 mg via INTRAVENOUS
  Filled 2020-07-31: qty 2

## 2020-07-31 MED ORDER — LIDOCAINE-EPINEPHRINE (PF) 2 %-1:200000 IJ SOLN
10.0000 mL | Freq: Once | INTRAMUSCULAR | Status: AC
  Administered 2020-07-31: 10 mL
  Filled 2020-07-31: qty 20

## 2020-07-31 MED ORDER — LACTATED RINGERS IV BOLUS
1000.0000 mL | Freq: Once | INTRAVENOUS | Status: AC
  Administered 2020-08-01: 1000 mL via INTRAVENOUS

## 2020-07-31 MED ORDER — SODIUM CHLORIDE 0.9 % IV SOLN
1000.0000 mL | INTRAVENOUS | Status: DC
  Administered 2020-07-31: 1000 mL via INTRAVENOUS

## 2020-07-31 NOTE — ED Notes (Signed)
Laceration to left elbow measures 0.2 cm

## 2020-07-31 NOTE — ED Notes (Signed)
Patient transported to CT 

## 2020-07-31 NOTE — ED Notes (Signed)
Pt's SpO2 on Room Air maintaining 86-89%. Pt placed on 2LNC at this time. SpO2 currently 99%.

## 2020-07-31 NOTE — ED Provider Notes (Signed)
[facility] Provider Note   CSN: [id number] Arrival date & time: 07/31/20  1703     History Chief Complaint  Patient presents with  . Fall  . Head Injury    Jacqueline Richmond is a 75 y.o. female.  HPI   Patient presented to the ED for evaluation of back and head pain.  Patient states she was in a rocking chair.  She was trying to use a rocking chair to stand up when the rocking chair gave way and the patient ended up falling falling down to brick steps.  Patient hit the back of her head.  She is also having pain in her upper and mid back from the fall patient denies any numbness or weakness.  No loss of consciousness.  No vomiting or diarrhea.  Past Medical History:  Diagnosis Date  . Allergy   . Anemia   . Arthritis   . Cataract    biltaerally   . CKD (chronic kidney disease) stage 3, GFR 30-59 ml/min (HCC) 08/29/2011   per patient, kidneys monitored by Dr Posey Pronto endocrinologist ,lov was  05-18-2018, "kidney fx stable"  . Colon polyp   . Complication of anesthesia   . Difficult intubation    small mouth opening  . Diverticulosis   . Gout   . HTN (hypertension) 08/29/2011  . Hyperlipemia   . Hypertension   . Psoriasis   . Type II or unspecified type diabetes mellitus without mention of complication, not stated as uncontrolled 08/29/2011    Patient Active Problem List   Diagnosis Date Noted  . Rotator cuff arthropathy of both shoulders 04/15/2019  . History of colonic polyps   . Polyp of transverse colon   . Diverticulosis of colon without hemorrhage   . Difficult intubation   . 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  . Type II or unspecified type diabetes mellitus without mention of complication, not stated as uncontrolled 08/29/2011  . CKD (chronic kidney disease) stage 3, GFR 30-59 ml/min (HCC) 08/29/2011  . ARF (acute renal failure) ([place]) 08/29/2011  . Hyperkalemia 08/29/2011  . HTN (hypertension) 08/29/2011  . Gout 08/29/2011    Past Surgical  History:  Procedure Laterality Date  . BREAST LUMPECTOMY WITH NEEDLE LOCALIZATION Right 05/07/2012   Procedure: BREAST LUMPECTOMY WITH NEEDLE LOCALIZATION;  Surgeon: Merrie Roof, MD;  Location: [facility];  Service: General;  Laterality: Right;  . CHOLECYSTECTOMY    . COLONOSCOPY  2009   Ardis Hughs   . COLONOSCOPY WITH PROPOFOL N/A 05/20/2018   Procedure: COLONOSCOPY WITH PROPOFOL;  Surgeon: Milus Banister, MD;  Location: [facility];  Service: Endoscopy;  Laterality: N/A;  . HERNIA REPAIR    . HIP SURGERY    . POLYPECTOMY  05/20/2018   Procedure: POLYPECTOMY;  Surgeon: Milus Banister, MD;  Location: [facility];  Service: Endoscopy;;  . RHINOPLASTY    . SALPINGOOPHORECTOMY    . TOTAL HIP ARTHROPLASTY Right 2004     OB History   No obstetric history on file.     Family History  Problem Relation Age of Onset  . Heart disease Father   . Arthritis Sister   . Colon cancer Neg Hx   . Colon polyps Neg Hx   . Esophageal cancer Neg Hx   . Stomach cancer Neg Hx   . Rectal cancer Neg Hx     Social History   Tobacco Use  . Smoking status: Never Smoker  . Smokeless tobacco: Never Used  Vaping Use  .  Vaping Use: Never used  Substance Use Topics  . Alcohol use: Yes    Comment: Drinks on Special Occasions  . Drug use: No    Home Medications Prior to Admission medications   Medication Sig Start Date End Date Taking? Authorizing Provider  allopurinol (ZYLOPRIM) 300 MG tablet Take 1 tablet (300 mg total) by mouth daily. 06/07/19  Yes Lyndal Pulley, DO  aspirin EC 81 MG tablet Take 81 mg by mouth daily. Reported on 06/19/2015   Yes [provider]  atenolol (TENORMIN) 100 MG tablet Take 100 mg by mouth daily.   Yes [provider]  b complex vitamins tablet Take 1 tablet by mouth daily.   Yes [provider]  CINNAMON PO Take 2,000 mg by mouth 2 (two) times daily. Reported on 06/19/2015   Yes [provider]  cloNIDine (CATAPRES) 0.3 MG tablet  Take 0.3 mg by mouth 2 (two) times daily.   Yes [provider]  furosemide (LASIX) 40 MG tablet Take 40 mg by mouth daily.    Yes [provider]  Garlic 7989 MG CAPS Take 2,000 mg by mouth 2 (two) times daily.   Yes [provider]  glipiZIDE (GLUCOTROL XL) 10 MG 24 hr tablet Take 10 mg by mouth daily with breakfast.   Yes [provider]  loratadine (CLARITIN) 10 MG tablet Take 10 mg by mouth daily.   Yes [provider]  mometasone (ELOCON) 0.1 % cream Apply 1 application topically daily as needed (psoriasis).  10/12/13  Yes [provider]  niacin 500 MG tablet Take 500 mg by mouth 2 (two) times daily with a meal. Reported on 06/19/2015   Yes [provider]  Omega-3 1000 MG CAPS Take 1,000 mg by mouth 2 (two) times daily.   Yes [provider]  sitaGLIPtin (JANUVIA) 50 MG tablet Take 50 mg by mouth daily.    Yes [provider]  allopurinol (ZYLOPRIM) 300 MG tablet Take 300 mg by mouth daily.     [provider]  doxycycline (VIBRA-TABS) 100 MG tablet Take 1 tablet (100 mg total) by mouth 2 (two) times daily. 06/09/19   Lyndal Pulley, DO  fenofibrate 54 MG tablet Take 1 tablet (54 mg total) by mouth at bedtime. 09/02/11 05/13/19  Thurnell Lose, MD  pravastatin (PRAVACHOL) 10 MG tablet Take 1 tablet by mouth daily. 10/28/18   [provider]    Allergies    Codeine, Iodinated diagnostic agents, Pentazocine, and Statins  Review of Systems   Review of Systems  All other systems reviewed and are negative.   Physical Exam Updated Vital Signs BP (!) 146/74 (BP Location: Right Arm)   Pulse 84   Temp 98.7 F (37.1 C) (Oral)   Resp 18   Ht 1.6 m (5\' 3" )   Wt 84.4 kg   SpO2 99%   BMI 32.95 kg/m   Physical Exam Vitals and nursing note reviewed.  Constitutional:      Appearance: She is well-developed.  HENT:     Head: Normocephalic.     Comments: Irregular laceration posterior  scalp    Right Ear: External ear normal.     Left Ear: External ear normal.  Eyes:     General: No scleral icterus.       Right eye: No discharge.        Left eye: No discharge.     Conjunctiva/sclera: Conjunctivae normal.  Neck:     Trachea: No  tracheal deviation.  Cardiovascular:     Rate and Rhythm: Normal rate and regular rhythm.  Pulmonary:     Effort: Pulmonary effort is normal. No respiratory distress.     Breath sounds: Normal breath sounds. No stridor. No wheezing or rales.  Abdominal:     General: Bowel sounds are normal. There is no distension.     Palpations: Abdomen is soft.     Tenderness: There is no abdominal tenderness. There is no guarding or rebound.  Musculoskeletal:        General: Tenderness present.     Cervical back: Neck supple. No deformity or tenderness.     Thoracic back: Edema, tenderness and bony tenderness present.     Lumbar back: Tenderness and bony tenderness present.     Comments: Edema and tenderness mid back, focal area of swelling noted, tenderness palpation in that area, abrasions noted in the upper and mid lower back  Skin:    General: Skin is warm and dry.     Findings: No rash.  Neurological:     Mental Status: She is alert.     Cranial Nerves: No cranial nerve deficit (no facial droop, extraocular movements intact, no slurred speech).     Sensory: No sensory deficit.     Motor: No abnormal muscle tone or seizure activity.     Coordination: Coordination normal.     ED Results / Procedures / Treatments   Labs (all labs ordered are listed, but only abnormal results are displayed) Labs Reviewed  CBC - Abnormal; Notable for the following components:      Result Value   WBC 13.5 (*)    Hemoglobin 11.2 (*)    HCT 35.1 (*)    All other components within normal limits  BASIC METABOLIC PANEL - Abnormal; Notable for the following components:   CO2 21 (*)    Glucose, Bld 113 (*)    BUN 61 (*)    Creatinine, Ser 2.05 (*)    GFR,  Estimated 25 (*)    All other components within normal limits  CBC - Abnormal; Notable for the following components:   WBC 18.5 (*)    RBC 3.05 (*)    Hemoglobin 8.6 (*)    HCT 27.4 (*)    All other components within normal limits  COMPREHENSIVE METABOLIC PANEL - Abnormal; Notable for the following components:   Potassium 5.6 (*)    Glucose, Bld 194 (*)    BUN 68 (*)    Creatinine, Ser 2.68 (*)    Calcium 8.7 (*)    Total Protein 6.4 (*)    Albumin 2.7 (*)    GFR, Estimated 18 (*)    All other components within normal limits  PHOSPHORUS - Abnormal; Notable for the following components:   Phosphorus 6.4 (*)    All other components within normal limits  URINALYSIS, ROUTINE W REFLEX MICROSCOPIC - Abnormal; Notable for the following components:   APPearance HAZY (*)    Protein, ur 30 (*)    Leukocytes,Ua LARGE (*)    WBC, UA >50 (*)    Bacteria, UA MANY (*)    All other components within normal limits  GLUCOSE, CAPILLARY - Abnormal; Notable for the following components:   Glucose-Capillary 158 (*)    All other components within normal limits  CBC - Abnormal; Notable for the following components:   WBC 15.1 (*)    RBC 3.63 (*)    Hemoglobin 10.4 (*)    HCT 31.4 (*)  RDW 16.0 (*)    All other components within normal limits  GLUCOSE, CAPILLARY - Abnormal; Notable for the following components:   Glucose-Capillary 151 (*)    All other components within normal limits  BASIC METABOLIC PANEL - Abnormal; Notable for the following components:   CO2 20 (*)    Glucose, Bld 141 (*)    BUN 67 (*)    Creatinine, Ser 2.42 (*)    Calcium 8.1 (*)    GFR, Estimated 21 (*)    All other components within normal limits  CBC - Abnormal; Notable for the following components:   WBC 14.9 (*)    RBC 3.15 (*)    Hemoglobin 8.7 (*)    HCT 26.8 (*)    RDW 17.6 (*)    All other components within normal limits  GLUCOSE, CAPILLARY - Abnormal; Notable for the following components:    Glucose-Capillary 155 (*)    All other components within normal limits  GLUCOSE, CAPILLARY - Abnormal; Notable for the following components:   Glucose-Capillary 152 (*)    All other components within normal limits  GLUCOSE, CAPILLARY - Abnormal; Notable for the following components:   Glucose-Capillary 149 (*)    All other components within normal limits  GLUCOSE, CAPILLARY - Abnormal; Notable for the following components:   Glucose-Capillary 139 (*)    All other components within normal limits  GLUCOSE, CAPILLARY - Abnormal; Notable for the following components:   Glucose-Capillary 166 (*)    All other components within normal limits  GLUCOSE, CAPILLARY - Abnormal; Notable for the following components:   Glucose-Capillary 106 (*)    All other components within normal limits  GLUCOSE, CAPILLARY - Abnormal; Notable for the following components:   Glucose-Capillary 255 (*)    All other components within normal limits  GLUCOSE, CAPILLARY - Abnormal; Notable for the following components:   Glucose-Capillary 245 (*)    All other components within normal limits  POCT I-STAT, CHEM 8 - Abnormal; Notable for the following components:   BUN 81 (*)    Creatinine, Ser 2.60 (*)    Glucose, Bld 156 (*)    TCO2 21 (*)    Hemoglobin 7.8 (*)    HCT 23.0 (*)    All other components within normal limits  POCT I-STAT 7, (LYTES, BLD GAS, ICA,H+H) - Abnormal; Notable for the following components:   pH, Arterial 7.271 (*)    pO2, Arterial 73 (*)    Bicarbonate 19.2 (*)    TCO2 20 (*)    Acid-base deficit 8.0 (*)    Calcium, Ion 1.13 (*)    HCT 24.0 (*)    Hemoglobin 8.2 (*)    All other components within normal limits  RESP PANEL BY RT-PCR (FLU A&B, COVID) ARPGX2  MRSA PCR SCREENING  CULTURE, BLOOD (ROUTINE X 2) W REFLEX TO ID PANEL  CULTURE, BLOOD (ROUTINE X 2) W REFLEX TO ID PANEL  LACTIC ACID, PLASMA  MAGNESIUM  HEMOGLOBIN A1C  TSH  CBC  BLOOD GAS, ARTERIAL  TYPE AND SCREEN  ABO/RH   PREPARE RBC (CROSSMATCH)  PREPARE RBC (CROSSMATCH)    EKG None  Radiology CT ABDOMEN PELVIS WO CONTRAST  Result Date: 08/02/2020 CLINICAL DATA:  Nausea and vomiting. EXAM: CT ABDOMEN AND PELVIS WITHOUT CONTRAST TECHNIQUE: Multidetector CT imaging of the abdomen and pelvis was performed following the standard protocol without IV contrast. COMPARISON:  X-ray abdomen 08/01/2020 FINDINGS: Lower chest: Interval increase in size of a heterogeneous moderate to large volume  right pleural effusion. Associated complete collapse of the right lower lobe. Trace left pleural effusion. Hepatobiliary: No focal liver abnormality is seen. Status post cholecystectomy. No biliary dilatation. Pancreas: No focal lesion. Normal pancreatic contour. No surrounding inflammatory changes. No main pancreatic ductal dilatation. Spleen: Normal in size without focal abnormality. Adrenals/Urinary Tract: No adrenal nodule bilaterally. Nonspecific bilateral perinephric stranding. Bilateral renal calcifications measuring up to 5 mm. No hydronephrosis. Exophytic 1.7 cm fluid density lesion within the right kidney likely represents a simple renal cyst. Otherwise no contour-deforming renal mass. No ureterolithiasis or hydroureter. Limited evaluation of distal ureters due to streak artifact originating from the right femoral surgical hardware. The urinary bladder is decompressed with a Foley catheter terminating within its lumen. There is limited evaluation of the urinary bladder due to streak artifact originating from the right surgical femoral hardware. Stomach/Bowel: Enteric tube coursing within the gastric lumen with side port just distal to the gastroesophageal junction. Stomach is within normal limits. No evidence of bowel wall thickening or dilatation. Scattered colonic diverticulosis. Appendix appears normal. Vascular/Lymphatic: No abdominal aorta or iliac aneurysm. Moderate atherosclerotic plaque of the aorta and its branches. No  abdominal, pelvic, or inguinal lymphadenopathy. Reproductive: Uterus and bilateral adnexa are unremarkable. Other: No intraperitoneal free fluid. No intraperitoneal free gas. No organized fluid collection. Musculoskeletal: Anterior abdominal hernia repair with mesh. Interval placement of partially visualized posterolateral thoracic fusion surgical hardware in a patient with T10-T11 acute vertebral body fracture with extension to the posterior elements. Slight angulation of the left T11 (3:12) surgical hardware with no violation of the spinal canal or findings to suggest impingement on the nerve roots. Overlying skin staples and subcutaneus soft tissue edema. Multilevel severe degenerative changes of the spine with fusion of the L3 through L5 vertebral bodies leading to straightening of the normal lumbar lordosis. Mild retrolisthesis of L5 on S1. No suspicious lytic or blastic osseous lesions. Total right hip arthroplasty.  Degenerative changes of the left hip. IMPRESSION: 1. Interval increase in size of a heterogeneous moderate to large volume right pleural effusion. Areas of increased density within the pleural fluid may represent hemothorax, collapsed right lower lobe lung, or less likely a mass lesion. 2. Interval development of trace left pleural effusion. 3. Nonspecific bilateral perinephric stranding. Consider correlation with urinalysis for infection. 4. Bilateral nonobstructive nephrolithiasis measuring up to 5 mm. 5. Scattered colonic diverticulosis with no acute diverticulitis. 6. Known T10-T11 fracture with interval placement of partially visualized posterolateral thoracic fusion surgical hardware. 7. Severe degenerative changes of the lumbar spine with L3 through L5 vertebral body fusion. 8.  Aortic Atherosclerosis (ICD10-I70.0). Electronically Signed   By: Iven Finn M.D.   On: 08/02/2020 00:09   DG Ribs Bilateral W/Chest  Result Date: 07/31/2020 CLINICAL DATA:  Fall.  Pain. EXAM: BILATERAL  RIBS AND CHEST - 4+ VIEW COMPARISON:  Remote chest radiograph 05/03/2012. No interval chest imaging. FINDINGS: No fracture or other bone lesions are seen involving the ribs. Right pleural effusion at least moderate in size. No pneumothorax. Heart size and mediastinal contours are within normal limits. Aortic atherosclerosis. IMPRESSION: 1. No evidence of rib fracture. 2. Right pleural effusion at least moderate in size. This is of unknown acuity. Electronically Signed   By: Keith Rake M.D.   On: 07/31/2020 20:39   DG Thoracic Spine 2 View  Result Date: 07/31/2020 CLINICAL DATA:  Unwitnessed fall.  Pain. EXAM: THORACIC SPINE 2 VIEWS COMPARISON:  None. FINDINGS: Broad-based dextroscoliotic curvature. No evidence of acute fracture or compression deformity.  Flowing anterior osteophytes throughout the thoracic spine. Minimal disc space narrowing at T10-T11 and T11-T12. no paravertebral soft tissue abnormality IMPRESSION: 1. No radiographic evidence of acute fracture of the thoracic spine. 2. Scoliosis and degenerative change. Electronically Signed   By: Keith Rake M.D.   On: 07/31/2020 20:36   DG THORACOLUMABAR SPINE  Result Date: 08/01/2020 CLINICAL DATA:  T8 through 11 fusion. EXAM: THORACOLUMBAR SPINE 1V COMPARISON:  Preoperative imaging. FINDINGS: Two fluoroscopic spot views of the thoracic spine obtained in frontal and lateral projections. Pedicle rods in the lower thoracic spine, levels difficult to accurately delineate on coned view. Total fluoroscopy time 20 seconds. Total dose 7.78 mGy. IMPRESSION: Intraoperative fluoroscopy during thoracic fusion. Electronically Signed   By: Keith Rake M.D.   On: 08/01/2020 16:16   DG Lumbar Spine Complete  Result Date: 07/31/2020 CLINICAL DATA:  Unwitnessed fall.  Pain. EXAM: LUMBAR SPINE - COMPLETE 4+ VIEW COMPARISON:  None. FINDINGS: Five non-rib-bearing lumbar vertebra. Straightening of normal lordosis. Near complete disc space loss at L4-L5  with prominent disc space narrowing at L3-L4 and L5-S1. Endplate spurring with lesser disc space narrowing at L1-L2 and L2-L3. Multilevel facet hypertrophy. Vertebral body heights are preserved. No evidence of fracture. Degenerative change of both sacroiliac joints. IMPRESSION: 1. No fracture of the lumbar spine. 2. Straightening of normal lordosis may be positioning or muscle spasm. Multilevel degenerative disc disease and facet hypertrophy. Electronically Signed   By: Keith Rake M.D.   On: 07/31/2020 20:37   DG Forearm Left  Result Date: 08/01/2020 CLINICAL DATA:  Fall yesterday with swelling. EXAM: LEFT FOREARM - 2 VIEW COMPARISON:  None. FINDINGS: Advanced degenerative changes involving the carpal bones and radiocarpal articulation. There is also more mild to moderate degenerative change about the elbow. No acute fracture or dislocation. No elbow joint effusion. Suboptimal patient positioning secondary to clinical status. Possible soft tissue swelling adjacent the proximal ulna. IMPRESSION: Advanced degenerative change, without acute osseous abnormality. Electronically Signed   By: Abigail Miyamoto M.D.   On: 08/01/2020 13:48   CT Head Wo Contrast  Result Date: 07/31/2020 CLINICAL DATA:  Recent fall from chair, initial encounter EXAM: CT HEAD WITHOUT CONTRAST CT CERVICAL SPINE WITHOUT CONTRAST TECHNIQUE: Multidetector CT imaging of the head and cervical spine was performed following the standard protocol without intravenous contrast. Multiplanar CT image reconstructions of the cervical spine were also generated. COMPARISON:  None. FINDINGS: CT HEAD FINDINGS Brain: No evidence of acute infarction, hemorrhage, hydrocephalus, extra-axial collection or mass lesion/mass effect. Calcification is noted in the midportion of the pons. Mild chronic white matter ischemic changes are noted. Vascular: No hyperdense vessel or unexpected calcification. Skull: Normal. Negative for fracture or focal lesion.  Sinuses/Orbits: No acute finding. Other: Mild parietal scalp hematoma is noted in the vertex on the left. CT CERVICAL SPINE FINDINGS Alignment: Mild loss of normal cervical lordosis is noted likely related to muscular spasm Skull base and vertebrae: 7 cervical segments are well visualized. Vertebral body height is well maintained. Multilevel osteophytic changes and facet hypertrophic changes are noted. No acute fracture or acute facet abnormality is seen. Soft tissues and spinal canal: Surrounding soft tissue structures show vascular calcifications. Enlargement of the right lobe of the thyroid is noted with evidence of calcifications and a nodule which measures at least 2.5 cm. No other soft tissue abnormality is noted. Upper chest: Visualized lung apices demonstrate evidence of a right-sided pleural effusion. This is of uncertain chronicity. No rib abnormality is noted. Other: None IMPRESSION: CT  of the head: Left parietal scalp hematoma. No acute intracranial abnormality is noted. CT of the cervical spine: Multilevel degenerative changes without acute bony abnormality. Right-sided pleural effusion. Prominent right lobe of the thyroid with at least 1 nodule within. Recommend nonemergent thyroid US (ref: J Am Coll Radiol. 2015 Feb;12(2): 143-50). Electronically Signed   By: Inez Catalina M.D.   On: 07/31/2020 18:10   CT Chest Wo Contrast  Result Date: 07/31/2020 CLINICAL DATA:  Fall.  Chest trauma. EXAM: CT CHEST WITHOUT CONTRAST TECHNIQUE: Multidetector CT imaging of the chest was performed following the standard protocol without IV contrast. COMPARISON:  Ribbon thoracic spine radiographs earlier today. FINDINGS: Cardiovascular: Moderate aortic atherosclerosis. No periaortic stranding to suggest injury. There are coronary artery calcifications. Heart is normal in size, slightly displaced into the left chest. No pericardial effusion. Mediastinum/Nodes: Scattered small mediastinal lymph nodes, not enlarged by size  criteria. No esophageal wall thickening. Heterogeneously enlarged right lobe of the thyroid gland with scattered curvilinear calcifications. Included left thyroid lobe is normal. Lungs/Pleura: Moderate to large right pleural effusion, heterogeneous density. Areas of increased density within the pleural fluid may represent hemorrhage, collapsed lung or less likely masses. Small amount of pleural fluid appears loculated and tracks anteromedially. No pneumothorax. Left lung is clear. No left pleural effusion. Upper Abdomen: No definite free fluid in the upper abdomen. Diverticulosis at the splenic flexure of the colon. Cholecystectomy. Musculoskeletal: Bones are diffusely under mineralized. Diffuse thoracic ankylosis. Displaced fracture through T10 vertebral body involves the anterior third, extend into the T9-T10 disc space, and is displaced anteriorly. Fracture extends through anterior osteophytes as well as the anterior superior aspect of T11 vertebral body. No convincing extension to involve the posterior elements. There is adjacent paravertebral hemorrhage and small volume of gas, likely related to escaped vacuum phenomenon. No additional thoracic spine fracture. Diffuse ankylosis with flowing anterior osteophytes. No acute fracture of the ribs, included clavicles or shoulder girdles. No sternal fracture. IMPRESSION: 1. Extension injury of the thoracic spine with displaced T10 vertebral body fracture extending into the T9-T10 disc space and anterior vertebral body. Fracture extends through anterior osteophytes as well as the anterior superior aspect of T11 vertebral body. No convincing extension to involve the posterior elements. There is adjacent paravertebral hemorrhage and small volume of gas, likely related to escaped vacuum phenomenon. Recommend spine consultation. 2. Moderate to large right pleural effusion, heterogeneous density. Areas of increased density within the pleural fluid may represent hemorrhage,  collapsed lung or less likely masses. Recommend continued radiographic follow-up to ensure resolution. No pneumothorax or rib fractures. 3. Heterogeneously enlarged right lobe of the thyroid gland with scattered curvilinear calcifications. Recommend thyroid US (ref: J Am Coll Radiol. 2015 Feb;12(2): 143-50). Aortic Atherosclerosis (ICD10-I70.0). Electronically Signed   By: Keith Rake M.D.   On: 07/31/2020 22:29   CT Cervical Spine Wo Contrast  Result Date: 07/31/2020 CLINICAL DATA:  Recent fall from chair, initial encounter EXAM: CT HEAD WITHOUT CONTRAST CT CERVICAL SPINE WITHOUT CONTRAST TECHNIQUE: Multidetector CT imaging of the head and cervical spine was performed following the standard protocol without intravenous contrast. Multiplanar CT image reconstructions of the cervical spine were also generated. COMPARISON:  None. FINDINGS: CT HEAD FINDINGS Brain: No evidence of acute infarction, hemorrhage, hydrocephalus, extra-axial collection or mass lesion/mass effect. Calcification is noted in the midportion of the pons. Mild chronic white matter ischemic changes are noted. Vascular: No hyperdense vessel or unexpected calcification. Skull: Normal. Negative for fracture or focal lesion. Sinuses/Orbits: No acute finding. Other:  Mild parietal scalp hematoma is noted in the vertex on the left. CT CERVICAL SPINE FINDINGS Alignment: Mild loss of normal cervical lordosis is noted likely related to muscular spasm Skull base and vertebrae: 7 cervical segments are well visualized. Vertebral body height is well maintained. Multilevel osteophytic changes and facet hypertrophic changes are noted. No acute fracture or acute facet abnormality is seen. Soft tissues and spinal canal: Surrounding soft tissue structures show vascular calcifications. Enlargement of the right lobe of the thyroid is noted with evidence of calcifications and a nodule which measures at least 2.5 cm. No other soft tissue abnormality is noted.  Upper chest: Visualized lung apices demonstrate evidence of a right-sided pleural effusion. This is of uncertain chronicity. No rib abnormality is noted. Other: None IMPRESSION: CT of the head: Left parietal scalp hematoma. No acute intracranial abnormality is noted. CT of the cervical spine: Multilevel degenerative changes without acute bony abnormality. Right-sided pleural effusion. Prominent right lobe of the thyroid with at least 1 nodule within. Recommend nonemergent thyroid US (ref: J Am Coll Radiol. 2015 Feb;12(2): 143-50). Electronically Signed   By: Inez Catalina M.D.   On: 07/31/2020 18:10    Procedures .Marland KitchenLaceration Repair  Date/Time: 07/31/2020 6:57 PM Performed by: Dorie Rank, MD Authorized by: Dorie Rank, MD   Consent:    Consent obtained:  Verbal   Consent given by:  Patient   Risks discussed:  Infection, need for additional repair, pain, poor cosmetic result and poor wound healing   Alternatives discussed:  No treatment and delayed treatment Universal protocol:    Procedure explained and questions answered to patient or proxy's satisfaction: yes     Relevant documents present and verified: yes     Test results available: yes     Imaging studies available: yes     Required blood products, implants, devices, and special equipment available: yes     Site/side marked: yes     Immediately prior to procedure, a time out was called: yes     Patient identity confirmed:  Verbally with patient Anesthesia:    Anesthesia method:  Local infiltration   Local anesthetic:  Lidocaine 1% WITH epi Laceration details:    Location:  Scalp   Length (cm):  3 Exploration:    Wound extent: no underlying fracture noted   Treatment:    Area cleansed with:  Saline and povidone-iodine Skin repair:    Repair method:  Staples Approximation:    Approximation:  Close Repair type:    Repair type:  Simple Post-procedure details:    Dressing:  Open (no dressing)   Procedure completion:  Tolerated  well, no immediate complications     Medications Ordered in ED Medications - No data to display  ED Course  I have reviewed the triage vital signs and the nursing notes.  Pertinent labs & imaging results that were available during my care of the patient were reviewed by me and considered in my medical decision making (see chart for details).  Clinical Course as of 08/02/20 1559  Tue Jul 31, 2020  1759 Patient initially did not want pain medications but she has asked for them now [JK]  1859 Head CT and C-spine CT without any acute abnormalities [JK]  2047 Moderate pleural effusion noted on x-ray.  Unclear how long it has been there for.  I doubt traumatic etiology without rib fracture or any other signs of chest injury [JK]  2047 Lumbar films without acute fracture [JK]  2047 Thoracic spine films  without fracture [JK]  2119 Blood pressure is now running low.  Possibly related to the pain medications.  We will give a fluid bolus [JK]  2121 Patient attempted to ambulate.  Unable to do so [JK]  2234 CT scan shows T10 fracture.  Large right pleural effusion also noted.  No other definite thoracic injury [JK]  2301 Discussed with Dr Kae Heller.  Will see pt in consultation.   Recommend medical workup admission. [JK]  2336 Hemoglobin noted at 11.2. [JK]  2336 Creatinine elevated at 2.05 although similar to previous [JK]    Clinical Course User Index [JK] Dorie Rank, MD   MDM Rules/Calculators/A&P                         Patient presented to the ED for evaluation after a fall.  Patient struck the back of her head and her back after falling down to brick steps.  Patient did have a notable laceration of the back of her head.  She also had evidence of abrasions and contusions to her back.  X-rays were performed of the thoracic and lumbar spine.  No fractures were noted.  Chest x-ray does not show any rib fractures but it does show a pleural effusion.  Unclear if that is chronic or acute.  There is no  other rib fracture or or lung injury to suggest traumatic hemothorax at this time.  Will order CT scan for assess for other injuries.  Patient however was not able to ambulate.  Her blood pressure is also running a bit low and I think that could be related to her pain medications as are not seeing any signs of infection or acute blood loss.   CT scan schowed spinal fracture.  Pt neuro intact.   Large effusion still noted.  Doubt traumatic etiology.  Case was discussed with trauma and hospitalist.  Neuro surg also called.  Call pending.  Dr Randal Buba will notify them about pt's case   Final Clinical Impression(s) / ED Diagnoses Final diagnoses:  Thyroid nodule  Fall  Pleural effusion  Laceration of scalp, initial encounter  Closed fracture of tenth thoracic vertebra, unspecified fracture morphology, initial encounter [facility])      Dorie Rank, MD 08/02/20 1601

## 2020-07-31 NOTE — ED Triage Notes (Signed)
Pt was standing up out of her rocking  Chair and used the rocking chair to assist her. The rocking chair gave way and the Pt fell down two brick steps and hit the back of her head on the brick pathway.   Fall was unwitnessed. No thinners. Pt denies LOC, N/V.

## 2020-07-31 NOTE — ED Notes (Signed)
Pt has new onset of N/V, Dr. Tomi Bamberger notifed

## 2020-07-31 NOTE — ED Provider Notes (Signed)
1144 case d/w Vinnie Costella of NSG.  Please keep patient flat and and NPO for potential surgery.  Will discuss case with Dr. Kathyrn Sheriff. No need to order TLSO at this time.   Spinal precautions initiated patient made NPO.     Azion Centrella, MD 07/31/20 2345

## 2020-07-31 NOTE — ED Notes (Signed)
Pt is transported to CT

## 2020-07-31 NOTE — ED Notes (Signed)
Tried to ambulate the pt but unsuccessful -  She c/o increased back pain when she tried to get up from  Bed and her oxygen starts dropping -  Informed EDP  About it

## 2020-07-31 NOTE — Progress Notes (Signed)
  NEUROSURGERY PROGRESS NOTE   Received call from EDP regarding patient.  75 year old female who presented to [place] after a fall earlier today. Found to have new pleural effusion and T10 fracture with extension into T9-10 disc space in setting of diffuse bridging osteophytes. Patient likely has some underlying spondylopathy. This is possibly unstable fracture that may require stabilization. Patient to be admitted at [facility] under [facility]. Spine precautions. Keep head of bed flat. Keep NPO. Will review with Dr Kathyrn Sheriff Full consult to follow.

## 2020-07-31 NOTE — ED Notes (Addendum)
Consult for neurosurgery done by this RN

## 2020-08-01 ENCOUNTER — Encounter (HOSPITAL_COMMUNITY)
Admission: AD | Disposition: A | Discharge status: Skilled Nursing Facility | Payer: Self-pay | Source: Home / Self Care | Attending: Family Medicine

## 2020-08-01 ENCOUNTER — Inpatient Hospital Stay (HOSPITAL_COMMUNITY): Payer: Medicare PPO

## 2020-08-01 ENCOUNTER — Inpatient Hospital Stay (HOSPITAL_COMMUNITY): Payer: Medicare PPO | Admitting: Anesthesiology

## 2020-08-01 ENCOUNTER — Encounter (HOSPITAL_COMMUNITY): Payer: Self-pay | Admitting: Internal Medicine

## 2020-08-01 DIAGNOSIS — S271XXA Traumatic hemothorax, initial encounter: Secondary | ICD-10-CM | POA: Diagnosis present

## 2020-08-01 DIAGNOSIS — M19022 Primary osteoarthritis, left elbow: Secondary | ICD-10-CM | POA: Diagnosis present

## 2020-08-01 DIAGNOSIS — E041 Nontoxic single thyroid nodule: Secondary | ICD-10-CM | POA: Diagnosis present

## 2020-08-01 DIAGNOSIS — R569 Unspecified convulsions: Secondary | ICD-10-CM | POA: Diagnosis present

## 2020-08-01 DIAGNOSIS — I5033 Acute on chronic diastolic (congestive) heart failure: Secondary | ICD-10-CM | POA: Diagnosis not present

## 2020-08-01 DIAGNOSIS — M47814 Spondylosis without myelopathy or radiculopathy, thoracic region: Secondary | ICD-10-CM | POA: Diagnosis present

## 2020-08-01 DIAGNOSIS — N184 Chronic kidney disease, stage 4 (severe): Secondary | ICD-10-CM | POA: Diagnosis present

## 2020-08-01 DIAGNOSIS — G9341 Metabolic encephalopathy: Secondary | ICD-10-CM | POA: Diagnosis not present

## 2020-08-01 DIAGNOSIS — E875 Hyperkalemia: Secondary | ICD-10-CM | POA: Diagnosis not present

## 2020-08-01 DIAGNOSIS — E1122 Type 2 diabetes mellitus with diabetic chronic kidney disease: Secondary | ICD-10-CM | POA: Diagnosis present

## 2020-08-01 DIAGNOSIS — M2578 Osteophyte, vertebrae: Secondary | ICD-10-CM | POA: Diagnosis present

## 2020-08-01 DIAGNOSIS — G40909 Epilepsy, unspecified, not intractable, without status epilepticus: Secondary | ICD-10-CM | POA: Diagnosis not present

## 2020-08-01 DIAGNOSIS — J918 Pleural effusion in other conditions classified elsewhere: Secondary | ICD-10-CM | POA: Diagnosis not present

## 2020-08-01 DIAGNOSIS — L409 Psoriasis, unspecified: Secondary | ICD-10-CM | POA: Diagnosis present

## 2020-08-01 DIAGNOSIS — W19XXXA Unspecified fall, initial encounter: Secondary | ICD-10-CM | POA: Diagnosis not present

## 2020-08-01 DIAGNOSIS — R55 Syncope and collapse: Secondary | ICD-10-CM | POA: Diagnosis not present

## 2020-08-01 DIAGNOSIS — S22079A Unspecified fracture of T9-T10 vertebra, initial encounter for closed fracture: Secondary | ICD-10-CM | POA: Diagnosis present

## 2020-08-01 DIAGNOSIS — Z20822 Contact with and (suspected) exposure to covid-19: Secondary | ICD-10-CM | POA: Diagnosis present

## 2020-08-01 DIAGNOSIS — M109 Gout, unspecified: Secondary | ICD-10-CM | POA: Diagnosis present

## 2020-08-01 DIAGNOSIS — I313 Pericardial effusion (noninflammatory): Secondary | ICD-10-CM | POA: Diagnosis not present

## 2020-08-01 DIAGNOSIS — J9601 Acute respiratory failure with hypoxia: Secondary | ICD-10-CM | POA: Diagnosis not present

## 2020-08-01 DIAGNOSIS — J90 Pleural effusion, not elsewhere classified: Secondary | ICD-10-CM | POA: Diagnosis not present

## 2020-08-01 DIAGNOSIS — S0101XA Laceration without foreign body of scalp, initial encounter: Secondary | ICD-10-CM | POA: Diagnosis present

## 2020-08-01 DIAGNOSIS — Z6832 Body mass index (BMI) 32.0-32.9, adult: Secondary | ICD-10-CM | POA: Diagnosis not present

## 2020-08-01 DIAGNOSIS — J942 Hemothorax: Secondary | ICD-10-CM | POA: Diagnosis not present

## 2020-08-01 DIAGNOSIS — S5002XA Contusion of left elbow, initial encounter: Secondary | ICD-10-CM | POA: Diagnosis present

## 2020-08-01 DIAGNOSIS — N1831 Chronic kidney disease, stage 3a: Secondary | ICD-10-CM | POA: Diagnosis not present

## 2020-08-01 DIAGNOSIS — I130 Hypertensive heart and chronic kidney disease with heart failure and stage 1 through stage 4 chronic kidney disease, or unspecified chronic kidney disease: Secondary | ICD-10-CM | POA: Diagnosis present

## 2020-08-01 DIAGNOSIS — N179 Acute kidney failure, unspecified: Secondary | ICD-10-CM | POA: Diagnosis present

## 2020-08-01 DIAGNOSIS — K5730 Diverticulosis of large intestine without perforation or abscess without bleeding: Secondary | ICD-10-CM | POA: Diagnosis present

## 2020-08-01 DIAGNOSIS — W109XXA Fall (on) (from) unspecified stairs and steps, initial encounter: Secondary | ICD-10-CM | POA: Diagnosis present

## 2020-08-01 DIAGNOSIS — E669 Obesity, unspecified: Secondary | ICD-10-CM | POA: Diagnosis present

## 2020-08-01 DIAGNOSIS — E785 Hyperlipidemia, unspecified: Secondary | ICD-10-CM | POA: Diagnosis present

## 2020-08-01 HISTORY — PX: LAMINECTOMY WITH POSTERIOR LATERAL ARTHRODESIS LEVEL 4: SHX6338

## 2020-08-01 LAB — CBC
HCT: 27.4 % — ABNORMAL LOW (ref 36.0–46.0)
HCT: 31.4 % — ABNORMAL LOW (ref 36.0–46.0)
Hemoglobin: 8.6 g/dL — ABNORMAL LOW (ref 12.0–15.0)
Hemoglobin: 10.4 g/dL — ABNORMAL LOW (ref 12.0–15.0)
MCH: 28.2 pg (ref 26.0–34.0)
MCH: 28.7 pg (ref 26.0–34.0)
MCHC: 31.4 g/dL (ref 30.0–36.0)
MCHC: 33.1 g/dL (ref 30.0–36.0)
MCV: 89.8 fL (ref 80.0–100.0)
MCV: 86.5 fL (ref 80.0–100.0)
Platelets: 214 10*3/uL (ref 150–400)
Platelets: 163 10*3/uL (ref 150–400)
RBC: 3.05 MIL/uL — ABNORMAL LOW (ref 3.87–5.11)
RBC: 3.63 MIL/uL — ABNORMAL LOW (ref 3.87–5.11)
RDW: 15 % (ref 11.5–15.5)
RDW: 16 % — ABNORMAL HIGH (ref 11.5–15.5)
WBC: 18.5 10*3/uL — ABNORMAL HIGH (ref 4.0–10.5)
WBC: 15.1 10*3/uL — ABNORMAL HIGH (ref 4.0–10.5)
nRBC: 0 % (ref 0.0–0.2)
nRBC: 0 % (ref 0.0–0.2)

## 2020-08-01 LAB — GLUCOSE, CAPILLARY
Glucose-Capillary: 158 mg/dL — ABNORMAL HIGH (ref 70–99)
Glucose-Capillary: 152 mg/dL — ABNORMAL HIGH (ref 70–99)
Glucose-Capillary: 155 mg/dL — ABNORMAL HIGH (ref 70–99)
Glucose-Capillary: 151 mg/dL — ABNORMAL HIGH (ref 70–99)
Glucose-Capillary: 149 mg/dL — ABNORMAL HIGH (ref 70–99)

## 2020-08-01 LAB — PREPARE RBC (CROSSMATCH)

## 2020-08-01 LAB — COMPREHENSIVE METABOLIC PANEL
ALT: 15 U/L (ref 0–44)
AST: 21 U/L (ref 15–41)
Albumin: 2.7 g/dL — ABNORMAL LOW (ref 3.5–5.0)
Alkaline Phosphatase: 86 U/L (ref 38–126)
Anion gap: 8 (ref 5–15)
BUN: 68 mg/dL — ABNORMAL HIGH (ref 8–23)
CO2: 22 mmol/L (ref 22–32)
Calcium: 8.7 mg/dL — ABNORMAL LOW (ref 8.9–10.3)
Chloride: 107 mmol/L (ref 98–111)
Creatinine, Ser: 2.68 mg/dL — ABNORMAL HIGH (ref 0.44–1.00)
GFR, Estimated: 18 mL/min — ABNORMAL LOW (ref >60)
Glucose, Bld: 194 mg/dL — ABNORMAL HIGH (ref 70–99)
Potassium: 5.6 mmol/L — ABNORMAL HIGH (ref 3.5–5.1)
Sodium: 137 mmol/L (ref 135–145)
Total Bilirubin: 0.7 mg/dL (ref 0.3–1.2)
Total Protein: 6.4 g/dL — ABNORMAL LOW (ref 6.5–8.1)

## 2020-08-01 LAB — POCT I-STAT 7, (LYTES, BLD GAS, ICA,H+H)
Acid-base deficit: 8 mmol/L — ABNORMAL HIGH (ref 0.0–2.0)
Bicarbonate: 19.2 mmol/L — ABNORMAL LOW (ref 20.0–28.0)
Calcium, Ion: 1.13 mmol/L — ABNORMAL LOW (ref 1.15–1.40)
HCT: 24 % — ABNORMAL LOW (ref 36.0–46.0)
Hemoglobin: 8.2 g/dL — ABNORMAL LOW (ref 12.0–15.0)
O2 Saturation: 94 %
Patient temperature: 34.8
Potassium: 4.9 mmol/L (ref 3.5–5.1)
Sodium: 140 mmol/L (ref 135–145)
TCO2: 20 mmol/L — ABNORMAL LOW (ref 22–32)
pCO2 arterial: 40.6 mmHg (ref 32.0–48.0)
pH, Arterial: 7.271 — ABNORMAL LOW (ref 7.350–7.450)
pO2, Arterial: 73 mmHg — ABNORMAL LOW (ref 83.0–108.0)

## 2020-08-01 LAB — POCT I-STAT, CHEM 8
BUN: 81 mg/dL — ABNORMAL HIGH (ref 8–23)
Calcium, Ion: 1.21 mmol/L (ref 1.15–1.40)
Chloride: 109 mmol/L (ref 98–111)
Creatinine, Ser: 2.6 mg/dL — ABNORMAL HIGH (ref 0.44–1.00)
Glucose, Bld: 156 mg/dL — ABNORMAL HIGH (ref 70–99)
HCT: 23 % — ABNORMAL LOW (ref 36.0–46.0)
Hemoglobin: 7.8 g/dL — ABNORMAL LOW (ref 12.0–15.0)
Potassium: 4.5 mmol/L (ref 3.5–5.1)
Sodium: 140 mmol/L (ref 135–145)
TCO2: 21 mmol/L — ABNORMAL LOW (ref 22–32)

## 2020-08-01 LAB — MAGNESIUM: Magnesium: 1.9 mg/dL (ref 1.7–2.4)

## 2020-08-01 LAB — ABO/RH: ABO/RH(D): O POS

## 2020-08-01 LAB — URINALYSIS, ROUTINE W REFLEX MICROSCOPIC
Bilirubin Urine: NEGATIVE
Glucose, UA: NEGATIVE mg/dL
Hgb urine dipstick: NEGATIVE
Ketones, ur: NEGATIVE mg/dL
Nitrite: NEGATIVE
Protein, ur: 30 mg/dL — AB
Specific Gravity, Urine: 1.012 (ref 1.005–1.030)
WBC, UA: >50 WBC/hpf — ABNORMAL HIGH (ref 0–5)
pH: 5 (ref 5.0–8.0)

## 2020-08-01 LAB — LACTIC ACID, PLASMA: Lactic Acid, Venous: 1.7 mmol/L (ref 0.5–1.9)

## 2020-08-01 LAB — RESP PANEL BY RT-PCR (FLU A&B, COVID) ARPGX2
Influenza A by PCR: NEGATIVE
Influenza B by PCR: NEGATIVE
SARS Coronavirus 2 by RT PCR: NEGATIVE

## 2020-08-01 LAB — TSH: TSH: 1.188 u[IU]/mL (ref 0.350–4.500)

## 2020-08-01 LAB — HEMOGLOBIN A1C
Hgb A1c MFr Bld: 5.4 % (ref 4.8–5.6)
Mean Plasma Glucose: 108.28 mg/dL

## 2020-08-01 LAB — MRSA PCR SCREENING: MRSA by PCR: NEGATIVE

## 2020-08-01 LAB — PHOSPHORUS: Phosphorus: 6.4 mg/dL — ABNORMAL HIGH (ref 2.5–4.6)

## 2020-08-01 IMAGING — US US THYROID
1 series · 13 of 25 positions shown · non-contrast
Comparison: None.

CLINICAL DATA: Thyroid enlargement

EXAM:
THYROID ULTRASOUND
TECHNIQUE: Ultrasound examination of the thyroid gland and adjacent soft
tissues was performed.

[Series 1: us thyroid · 13 of 41 slices shown]
[im 1/41]
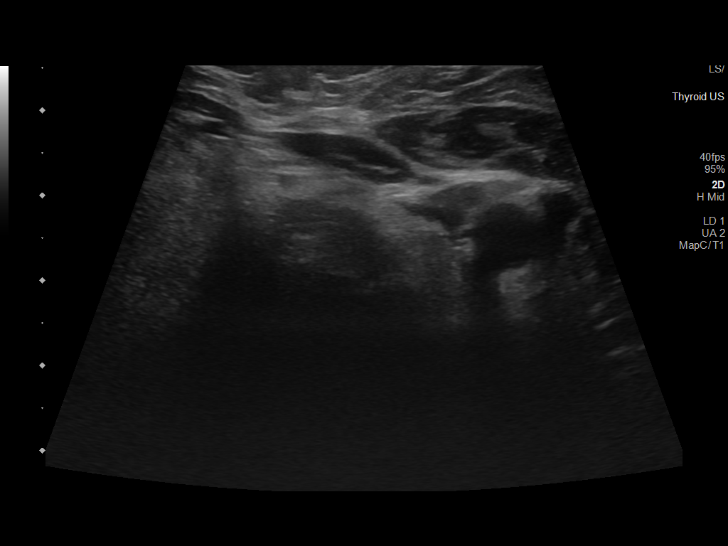
[im 4/41]
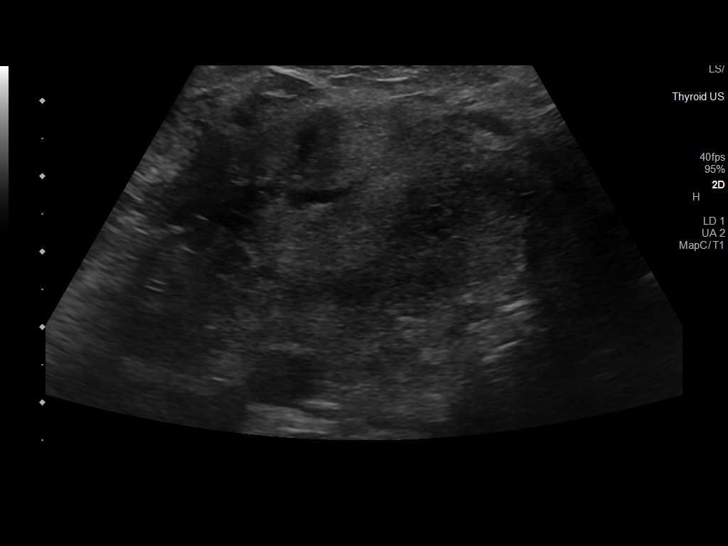
[im 7/41]
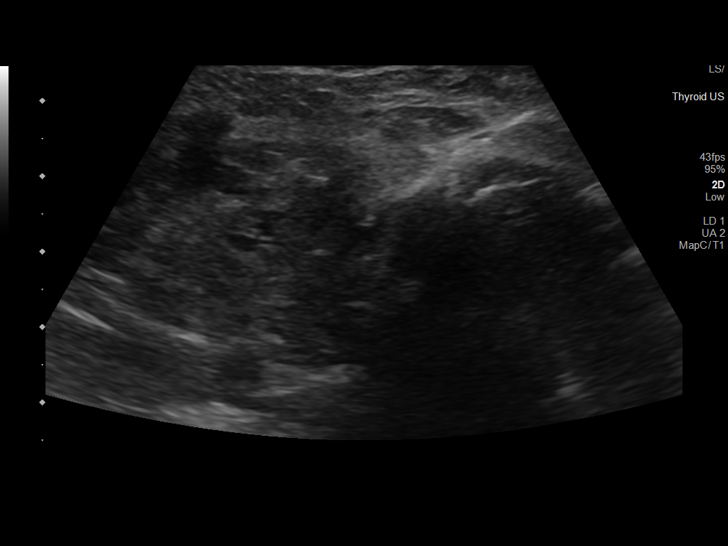
[im 11/41]
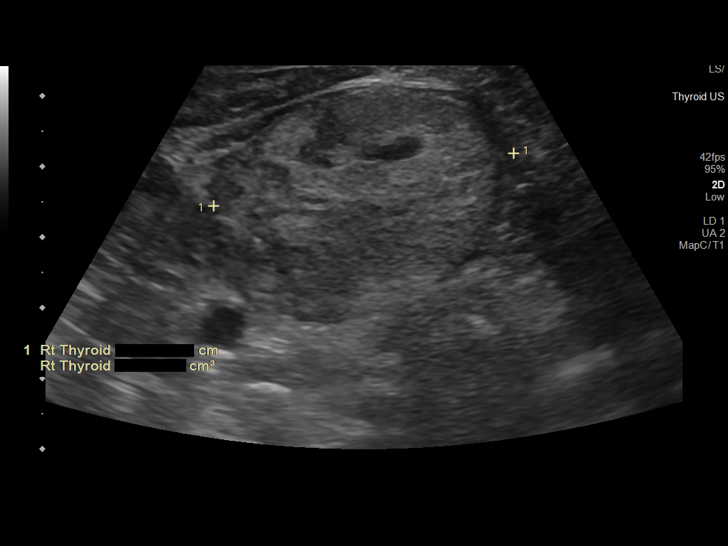
[im 14/41]
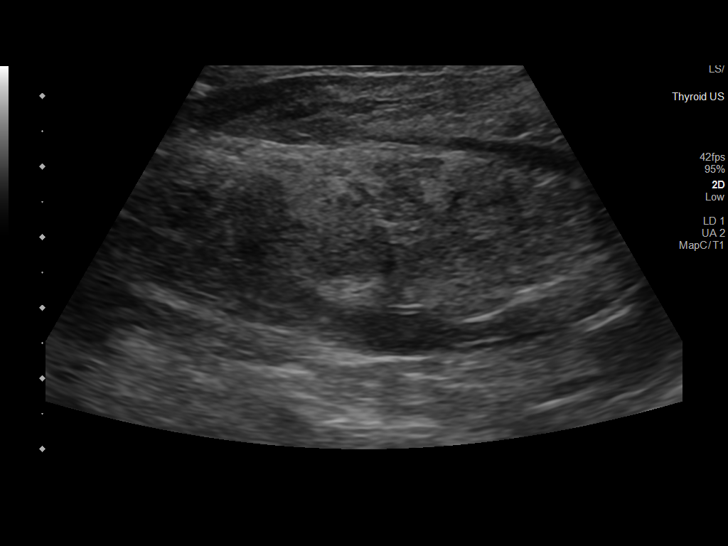
[im 17/41]
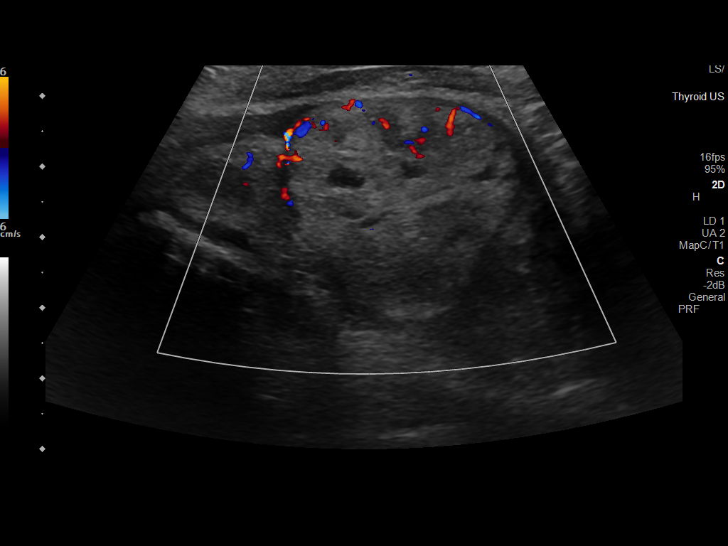
[im 21/41]
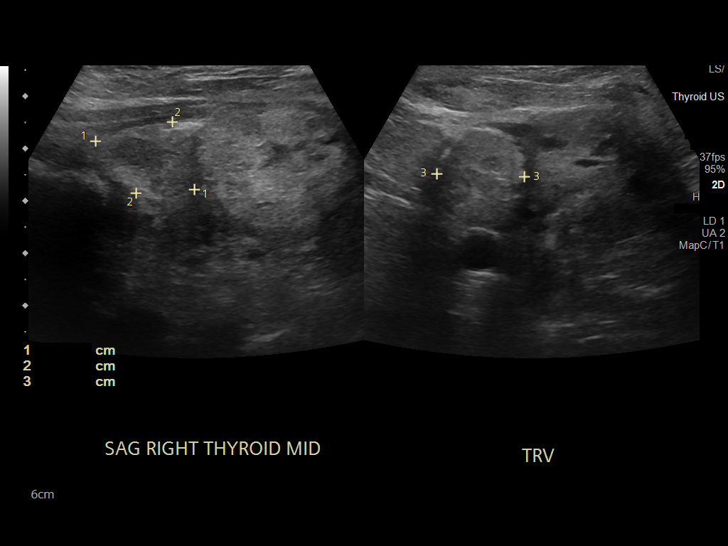
[im 24/41]
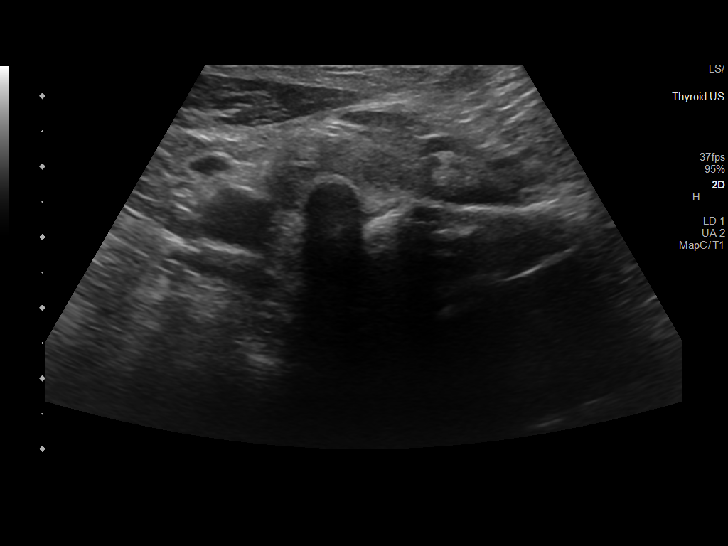
[im 27/41]
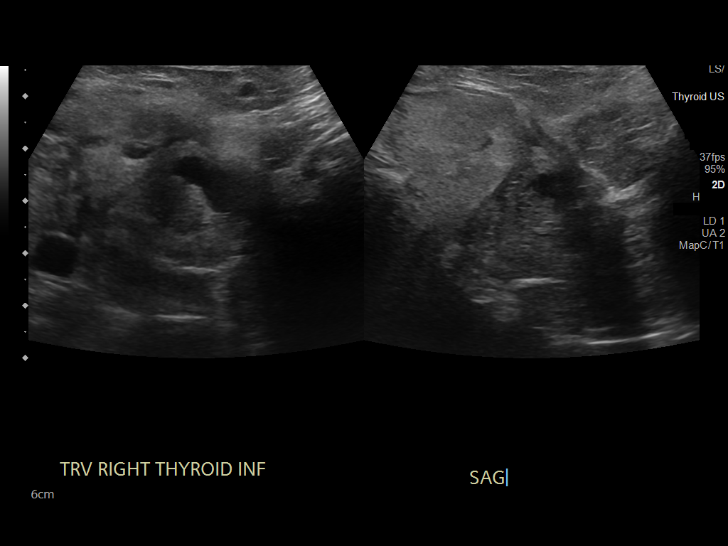
[im 31/41]
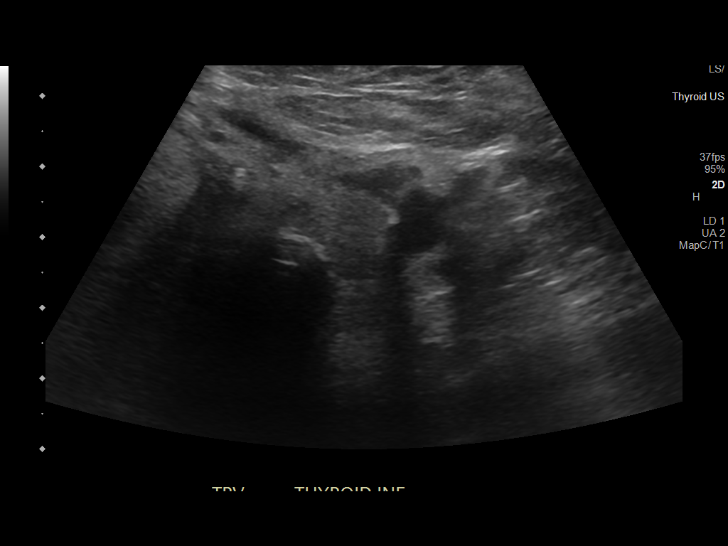
[im 34/41]
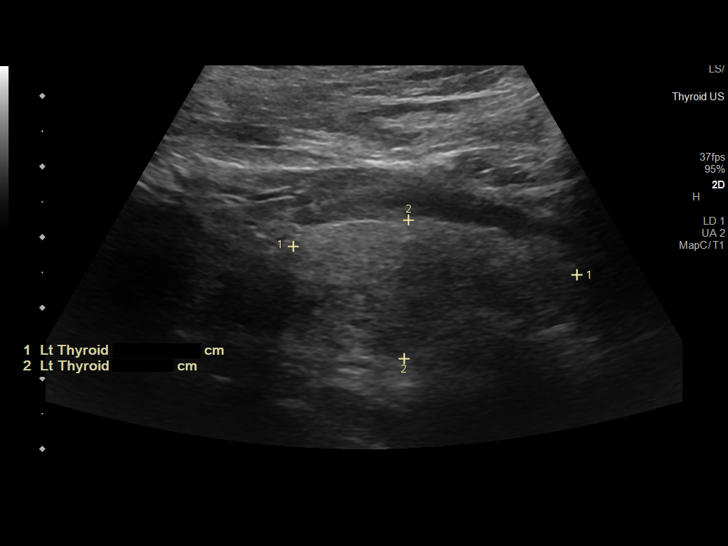
[im 37/41]
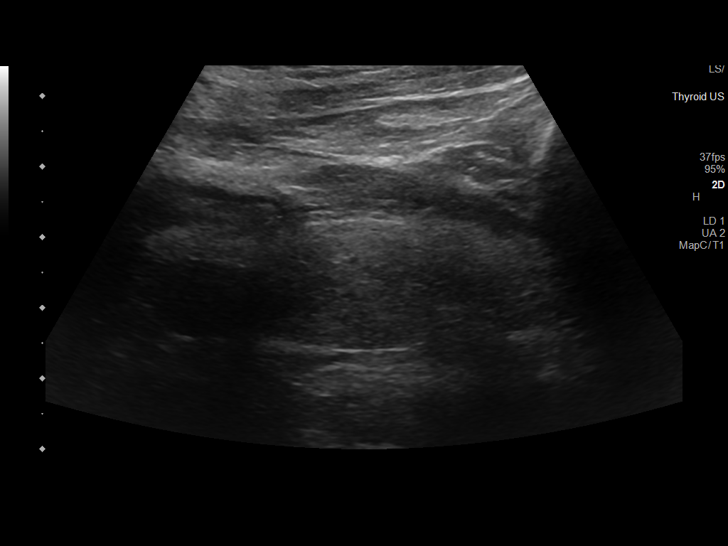
[im 41/41]
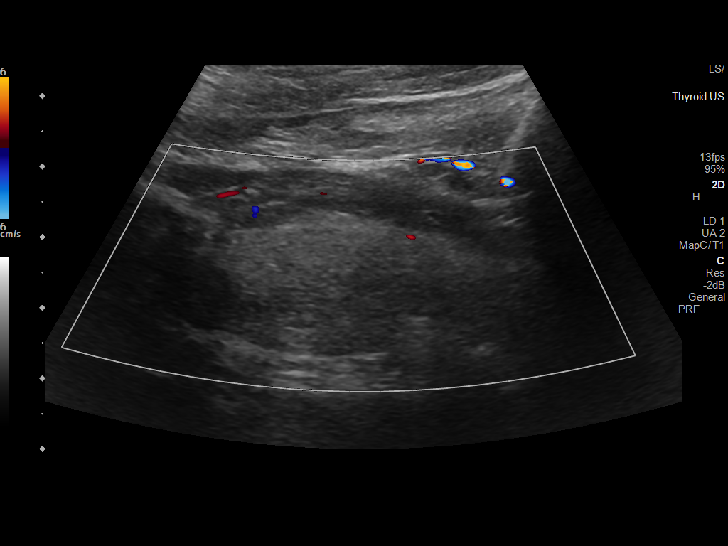

[13 of 25 positions shown; findings below may reference images not displayed]

FINDINGS: Parenchymal Echotexture: Mildly heterogeneous

Isthmus: 0.4 cm

Right lobe: 6.7 x 4.0 x 4.3 cm

Left lobe: 4.0 x 2.0 x 1.1 cm

_________________________________________________________

Estimated total number of nodules >/= 1 cm: 3

Number of spongiform nodules >/=  2 cm not described below (TR1): 0

Number of mixed cystic and solid nodules >/= 1.5 cm not described
below (TR2): 0

_________________________________________________________

Nodule # 1:

Location: Right; mid

Maximum size: 3.8 cm; Other 2 dimensions: 3.5 x 3.4 cm

Composition: solid/almost completely solid (2)

Echogenicity: isoechoic (1)

Shape: not taller-than-wide (0)

Margins: smooth (0)

Echogenic foci: none (0)

ACR TI-RADS total points: 3.

ACR TI-RADS risk category: TR3 (3 points).

ACR TI-RADS recommendations:

**Given size (>/= 2.5 cm) and appearance, fine needle aspiration of
this mildly suspicious nodule should be considered based on TI-RADS
criteria.

_________________________________________________________

Nodule # 2:

Location: Right; mid

Maximum size: 2.1 cm; Other 2 dimensions: 1.5 x 1.7 cm

Composition: solid/almost completely solid (2)

Echogenicity: isoechoic (1)

Shape: not taller-than-wide (0)

Margins: smooth (0)

Echogenic foci: none (0)

ACR TI-RADS total points: 3.

ACR TI-RADS risk category: TR3 (3 points).

ACR TI-RADS recommendations:

*Given size (>/= 1.5 - 2.4 cm) and appearance, a follow-up
ultrasound in 1 year should be considered based on TI-RADS criteria.

_________________________________________________________

Nodule # 3:

Location: Right; inferior

Maximum size: 2.7 cm; Other 2 dimensions: 2.5 x 2.5 cm

Composition: solid/almost completely solid (2)

Echogenicity: hypoechoic (2)

Shape: not taller-than-wide (0)

Margins: smooth (0)

Echogenic foci: none (0)

ACR TI-RADS total points: 4.

ACR TI-RADS risk category: TR4 (4-6 points).

ACR TI-RADS recommendations:

**Given size (>/= 1.5 cm) and appearance, fine needle aspiration of
this moderately suspicious nodule should be considered based on
TI-RADS criteria.

_________________________________________________________
IMPRESSION: 1. Nodule 1 (TI-RADS 3) and nodule 3 (TI-RADS 4) meets criteria for
FNA.

2. Nodule 2 (TI-RADS 3) meets criteria for surveillance. Follow-up
thyroid ultrasound should be performed in 1 year.

The above is in keeping with the ACR TI-RADS recommendations - [HOSPITAL] [18];[DATE].

## 2020-08-01 IMAGING — CT CT ABD-PELV W/O CM
2 of 5 series · 15 of 46 positions shown, 17 images · non-contrast
Comparison: X-ray abdomen [DATE]

CLINICAL DATA: Nausea and vomiting.

EXAM:
CT ABDOMEN AND PELVIS WITHOUT CONTRAST
TECHNIQUE: Multidetector CT imaging of the abdomen and pelvis was performed
following the standard protocol without IV contrast.

[Series 3: a/p w/o 5mm · axial · non-contrast · 0.81mm/px · z∈[+570,+1005]mm · 12 of 97 slices shown, 14 images]
[im 5/97  soft-tissue]
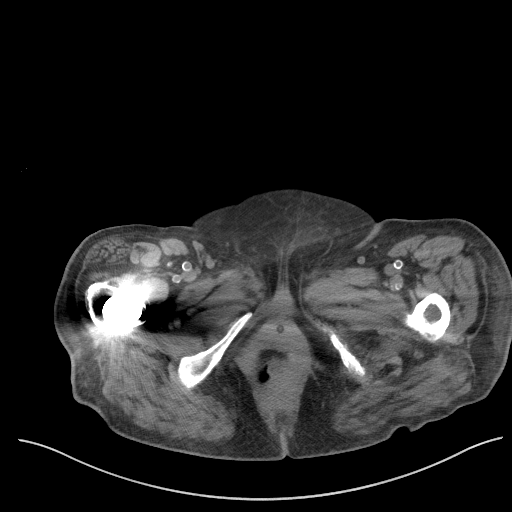
[im 5/97  bone]
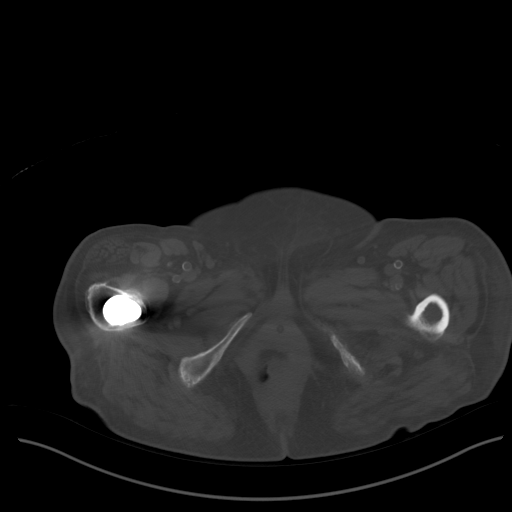
[im 14/97  soft-tissue]
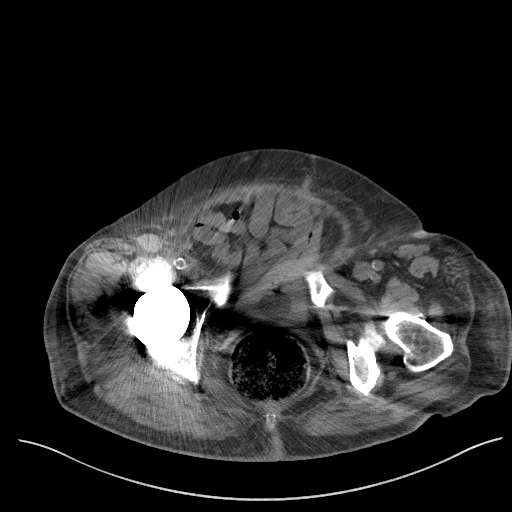
[im 22/97  soft-tissue]
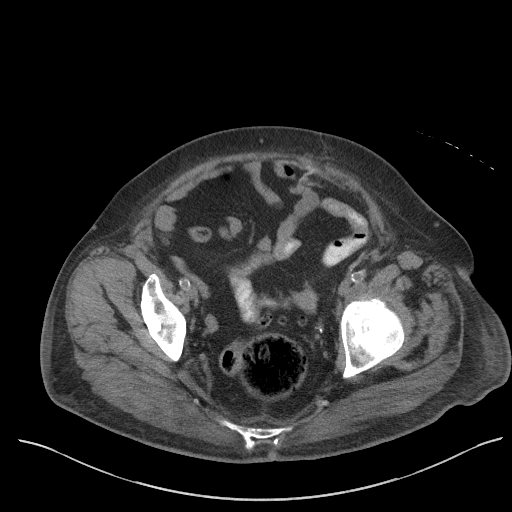
[im 31/97  soft-tissue]
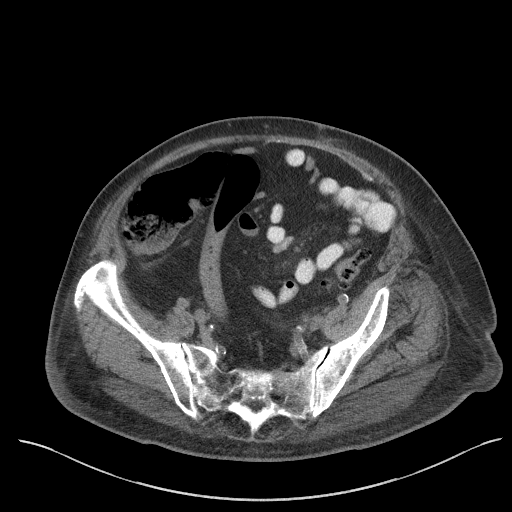
[im 35/97  soft-tissue]
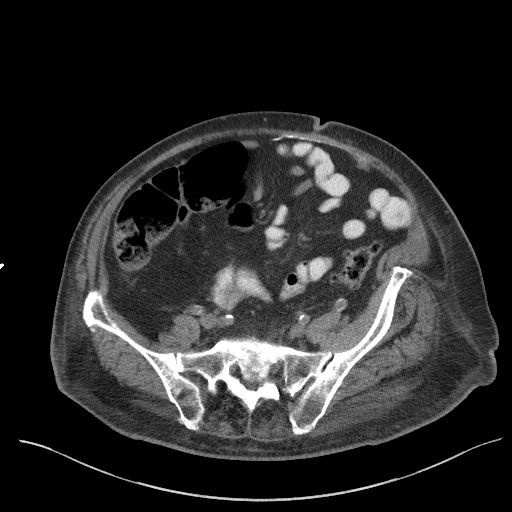
[im 44/97  soft-tissue]
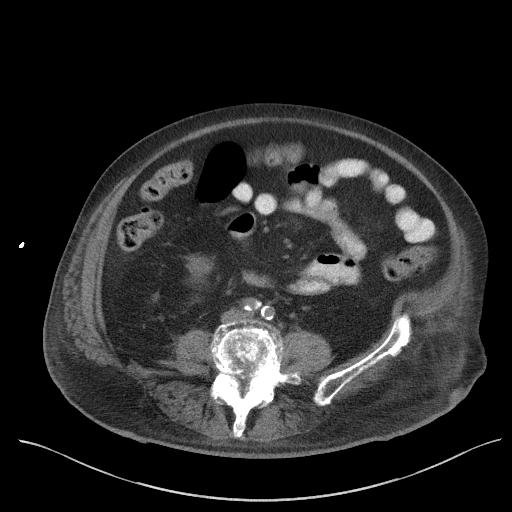
[im 53/97  soft-tissue]
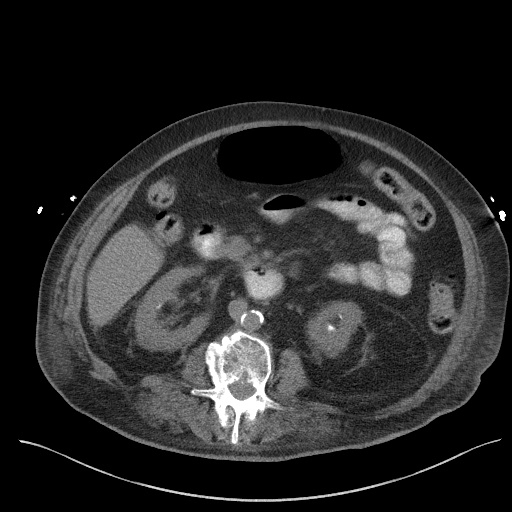
[im 62/97  soft-tissue]
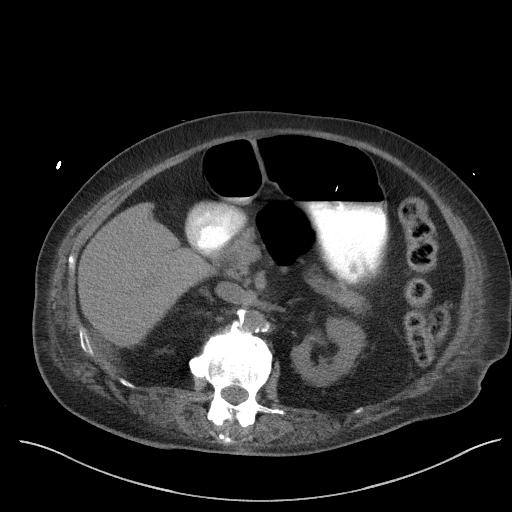
[im 66/97  soft-tissue]
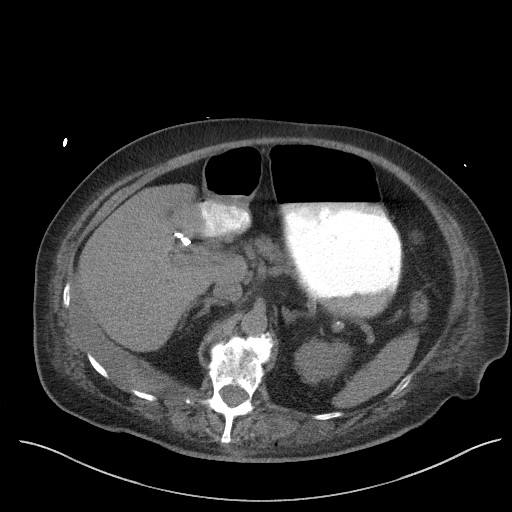
[im 66/97  bone]
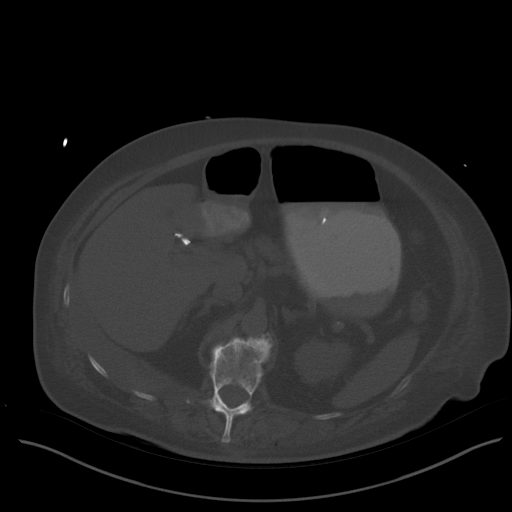
[im 75/97  soft-tissue]
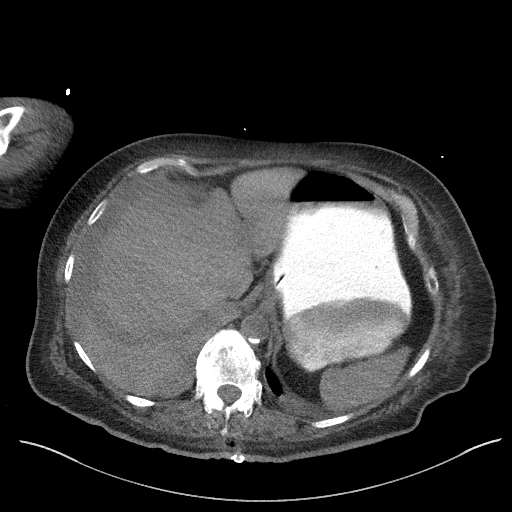
[im 83/97  soft-tissue]
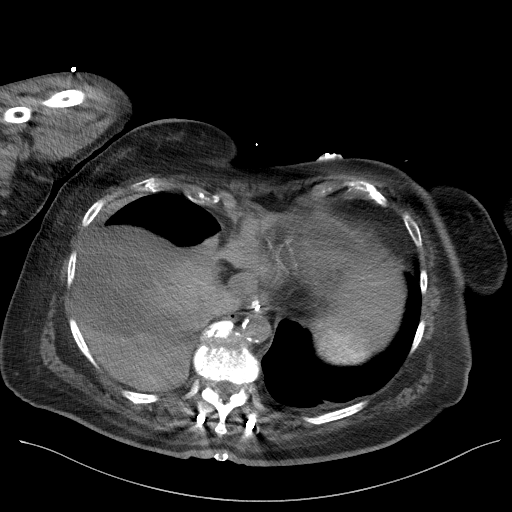
[im 92/97  soft-tissue]
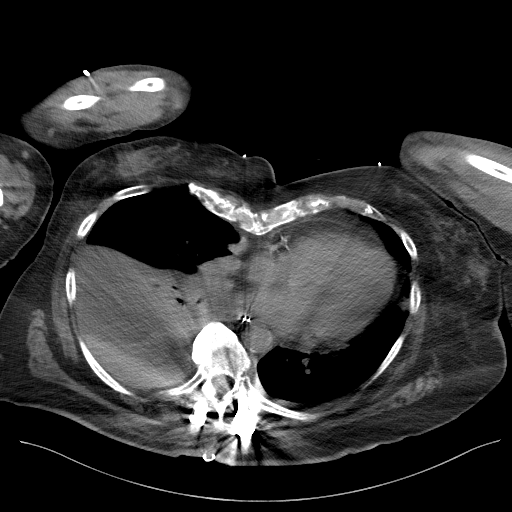

[Series 5: a/p w/o cor · coronal · non-contrast · 0.85mm/px · 3 of 151 slices shown]
[im 51/151  soft-tissue]
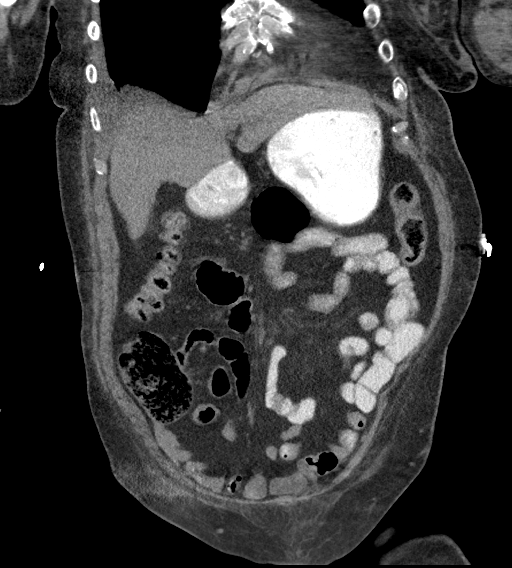
[im 67/151  soft-tissue]
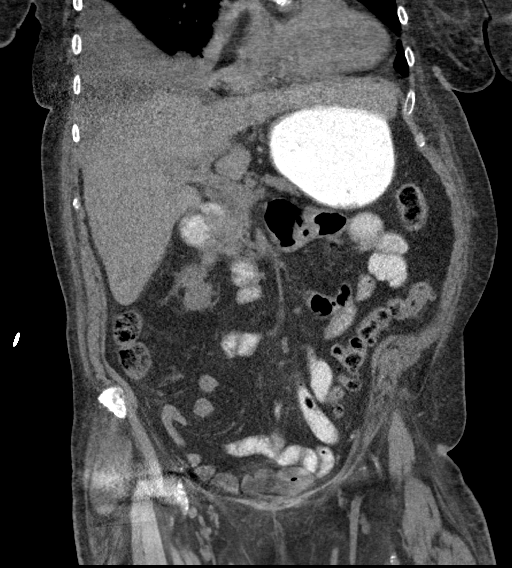
[im 84/151  soft-tissue]
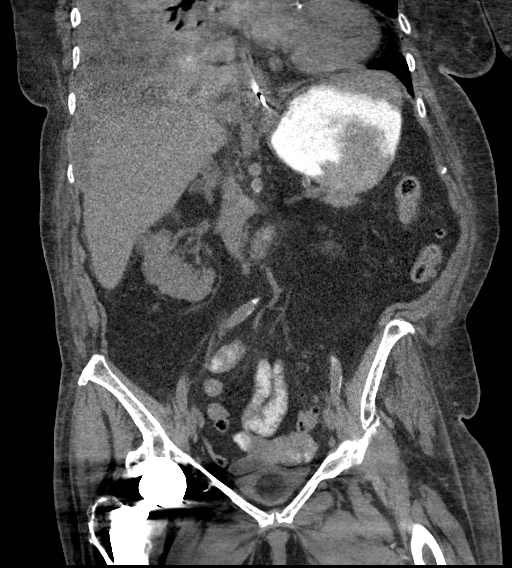

[15 of 46 positions shown; findings below may reference images not displayed]

FINDINGS: Lower chest:

Interval increase in size of a heterogeneous moderate to large
volume right pleural effusion. Associated complete collapse of the
right lower lobe. Trace left pleural effusion.

Hepatobiliary: No focal liver abnormality is seen. Status post
cholecystectomy. No biliary dilatation.

Pancreas: No focal lesion. Normal pancreatic contour. No surrounding
inflammatory changes. No main pancreatic ductal dilatation.

Spleen: Normal in size without focal abnormality.

Adrenals/Urinary Tract:

No adrenal nodule bilaterally.

Nonspecific bilateral perinephric stranding. Bilateral renal
calcifications measuring up to 5 mm. No hydronephrosis. Exophytic
1.7 cm fluid density lesion within the right kidney likely
represents a simple renal cyst. Otherwise no contour-deforming renal
mass. No ureterolithiasis or hydroureter. Limited evaluation of
distal ureters due to streak artifact originating from the right
femoral surgical hardware.

The urinary bladder is decompressed with a Foley catheter
terminating within its lumen. There is limited evaluation of the
urinary bladder due to streak artifact originating from the right
surgical femoral hardware.

Stomach/Bowel: Enteric tube coursing within the gastric lumen with
side port just distal to the gastroesophageal junction. Stomach is
within normal limits. No evidence of bowel wall thickening or
dilatation. Scattered colonic diverticulosis. Appendix appears
normal.

Vascular/Lymphatic: No abdominal aorta or iliac aneurysm. Moderate
atherosclerotic plaque of the aorta and its branches. No abdominal,
pelvic, or inguinal lymphadenopathy.

Reproductive: Uterus and bilateral adnexa are unremarkable.

Other: No intraperitoneal free fluid. No intraperitoneal free gas.
No organized fluid collection.

Musculoskeletal:

Anterior abdominal hernia repair with mesh.

Interval placement of partially visualized posterolateral thoracic
fusion surgical hardware in a patient with T10-T11 acute vertebral
body fracture with extension to the posterior elements. Slight
angulation of the left T11 ([DATE]) surgical hardware with no
violation of the spinal canal or findings to suggest impingement on
the nerve roots. Overlying skin staples and subcutaneus soft tissue
edema.

Multilevel severe degenerative changes of the spine with fusion of
the L3 through L5 vertebral bodies leading to straightening of the
normal lumbar lordosis. Mild retrolisthesis of L5 on S1.

No suspicious lytic or blastic osseous lesions.

Total right hip arthroplasty.  Degenerative changes of the left hip.
IMPRESSION: 1. Interval increase in size of a heterogeneous moderate to large
volume right pleural effusion. Areas of increased density within the
pleural fluid may represent hemothorax, collapsed right lower lobe
lung, or less likely a mass lesion.
2. Interval development of trace left pleural effusion.
3. Nonspecific bilateral perinephric stranding. Consider correlation
with urinalysis for infection.
4. Bilateral nonobstructive nephrolithiasis measuring up to 5 mm.
5. Scattered colonic diverticulosis with no acute diverticulitis.
6. Known T10-T11 fracture with interval placement of partially
visualized posterolateral thoracic fusion surgical hardware.
7. Severe degenerative changes of the lumbar spine with L3 through
L5 vertebral body fusion.
8.  Aortic Atherosclerosis ([IX]-[IX]).

## 2020-08-01 IMAGING — RF DG C-ARM 1-60 MIN
1 series · 2 of 2 positions shown · non-contrast
Comparison: Preoperative imaging.

CLINICAL DATA: T8 through 11 fusion.

EXAM:
THORACOLUMBAR SPINE 1V

[Series 1: run · 2 of 2 slices shown]
[im 1/2]
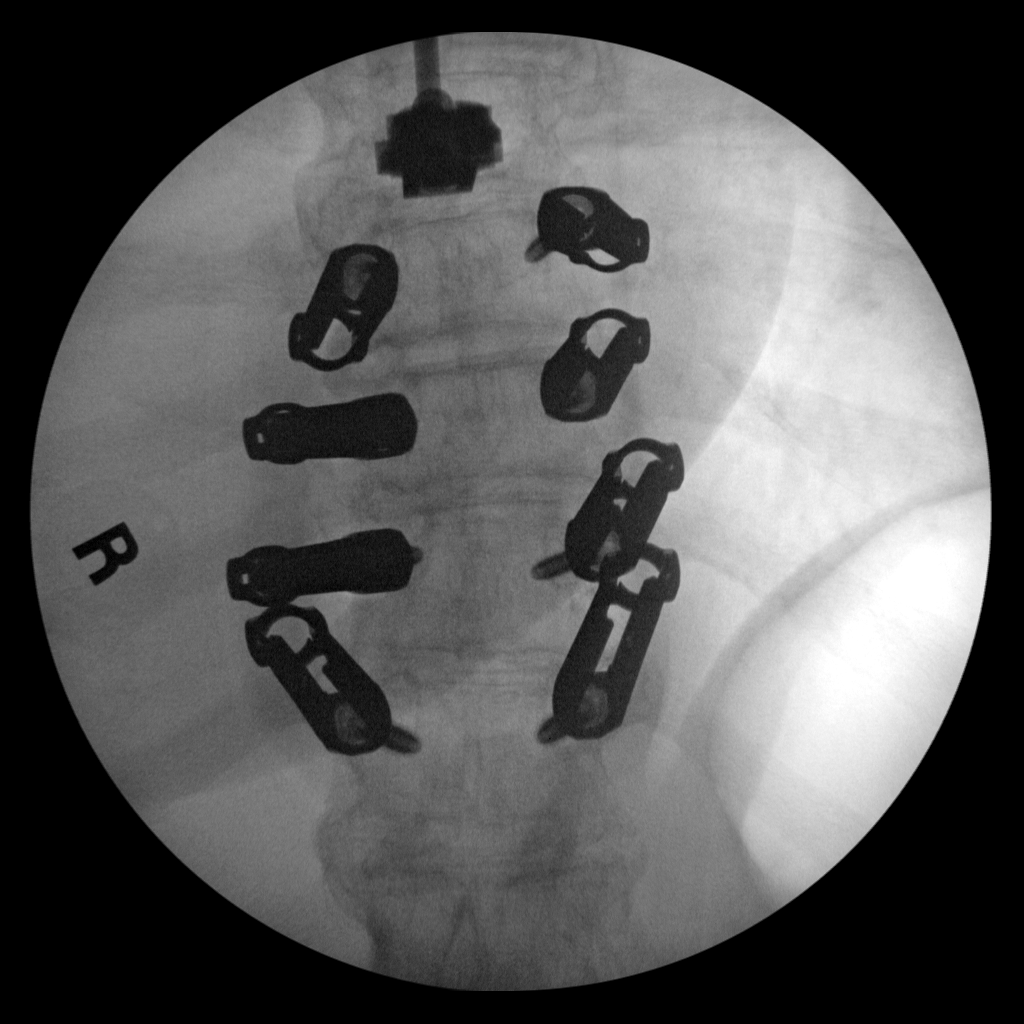
[im 2/2]
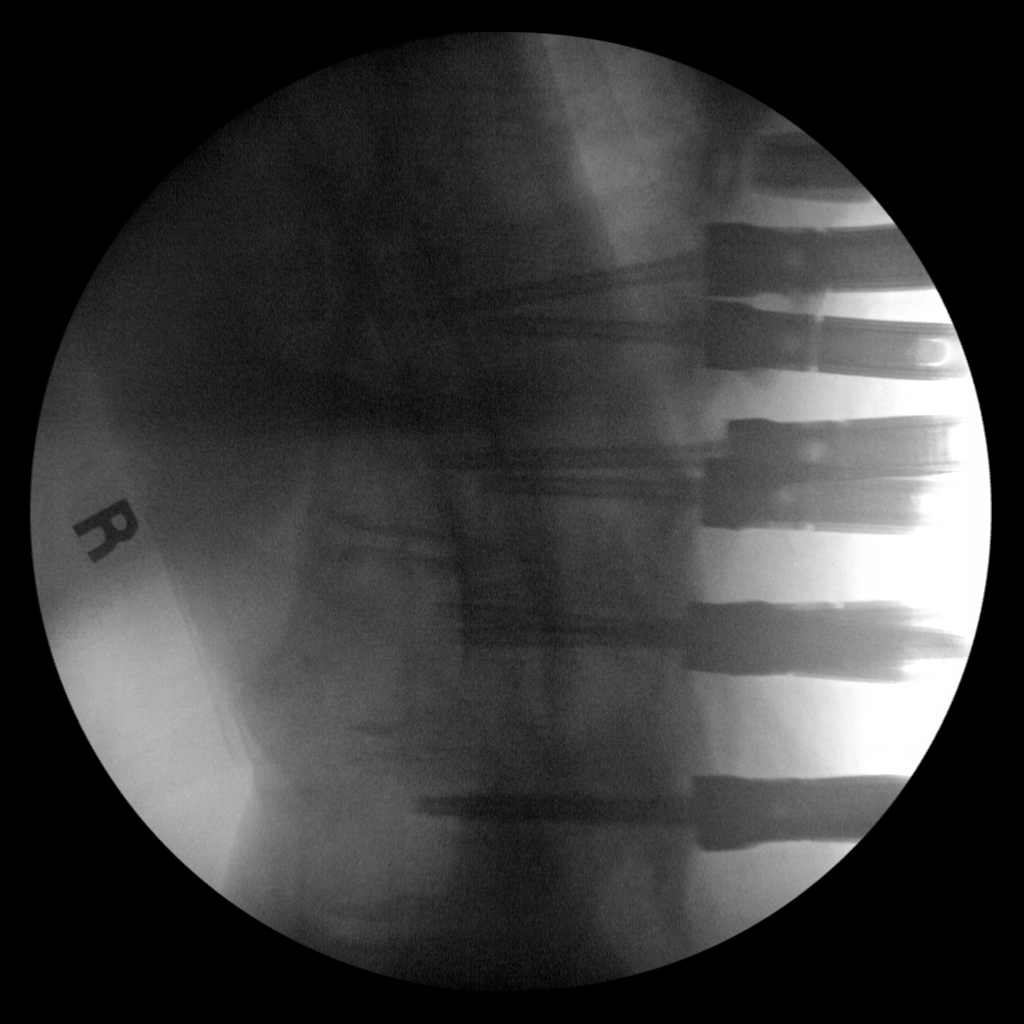

[2 of 2 positions shown; findings below may reference images not displayed]

FINDINGS: Two fluoroscopic spot views of the thoracic spine obtained in
frontal and lateral projections. Pedicle rods in the lower thoracic
spine, levels difficult to accurately delineate on coned view. Total
fluoroscopy time 20 seconds. Total dose 7.78 mGy.
IMPRESSION: Intraoperative fluoroscopy during thoracic fusion.

## 2020-08-01 IMAGING — RF DG THORACOLUMBAR SPINE 2V
1 series · 2 of 2 positions shown · non-contrast
Comparison: Preoperative imaging.

CLINICAL DATA: T8 through 11 fusion.

EXAM:
THORACOLUMBAR SPINE 1V

[Series 1: run · 2 of 2 slices shown]
[im 1/2]
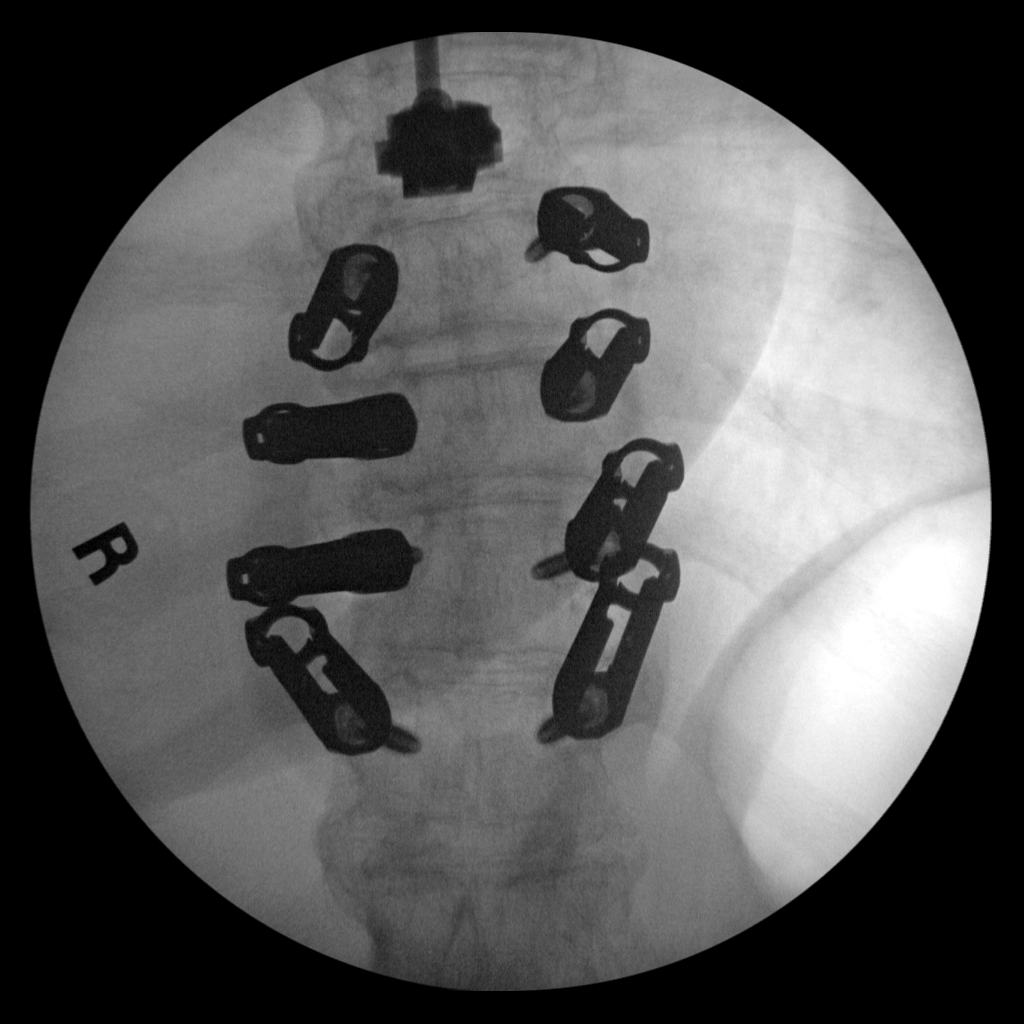
[im 2/2]
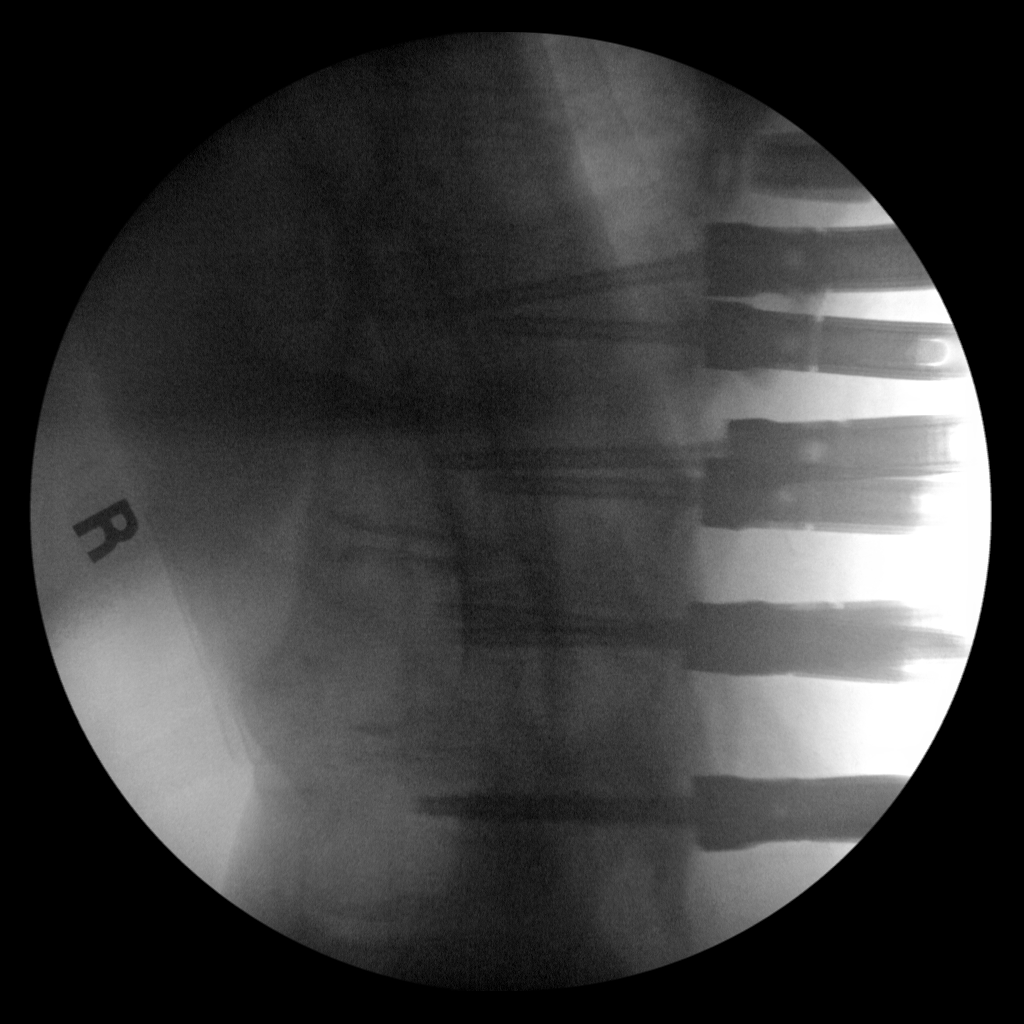

[2 of 2 positions shown; findings below may reference images not displayed]

FINDINGS: Two fluoroscopic spot views of the thoracic spine obtained in
frontal and lateral projections. Pedicle rods in the lower thoracic
spine, levels difficult to accurately delineate on coned view. Total
fluoroscopy time 20 seconds. Total dose 7.78 mGy.
IMPRESSION: Intraoperative fluoroscopy during thoracic fusion.

## 2020-08-01 IMAGING — DX DG ABD PORTABLE 1V
1 series · 1 of 1 positions shown · non-contrast
Comparison: None

CLINICAL DATA: NG tube placement

EXAM:
PORTABLE ABDOMEN - 1 VIEW

[abdomen kub]
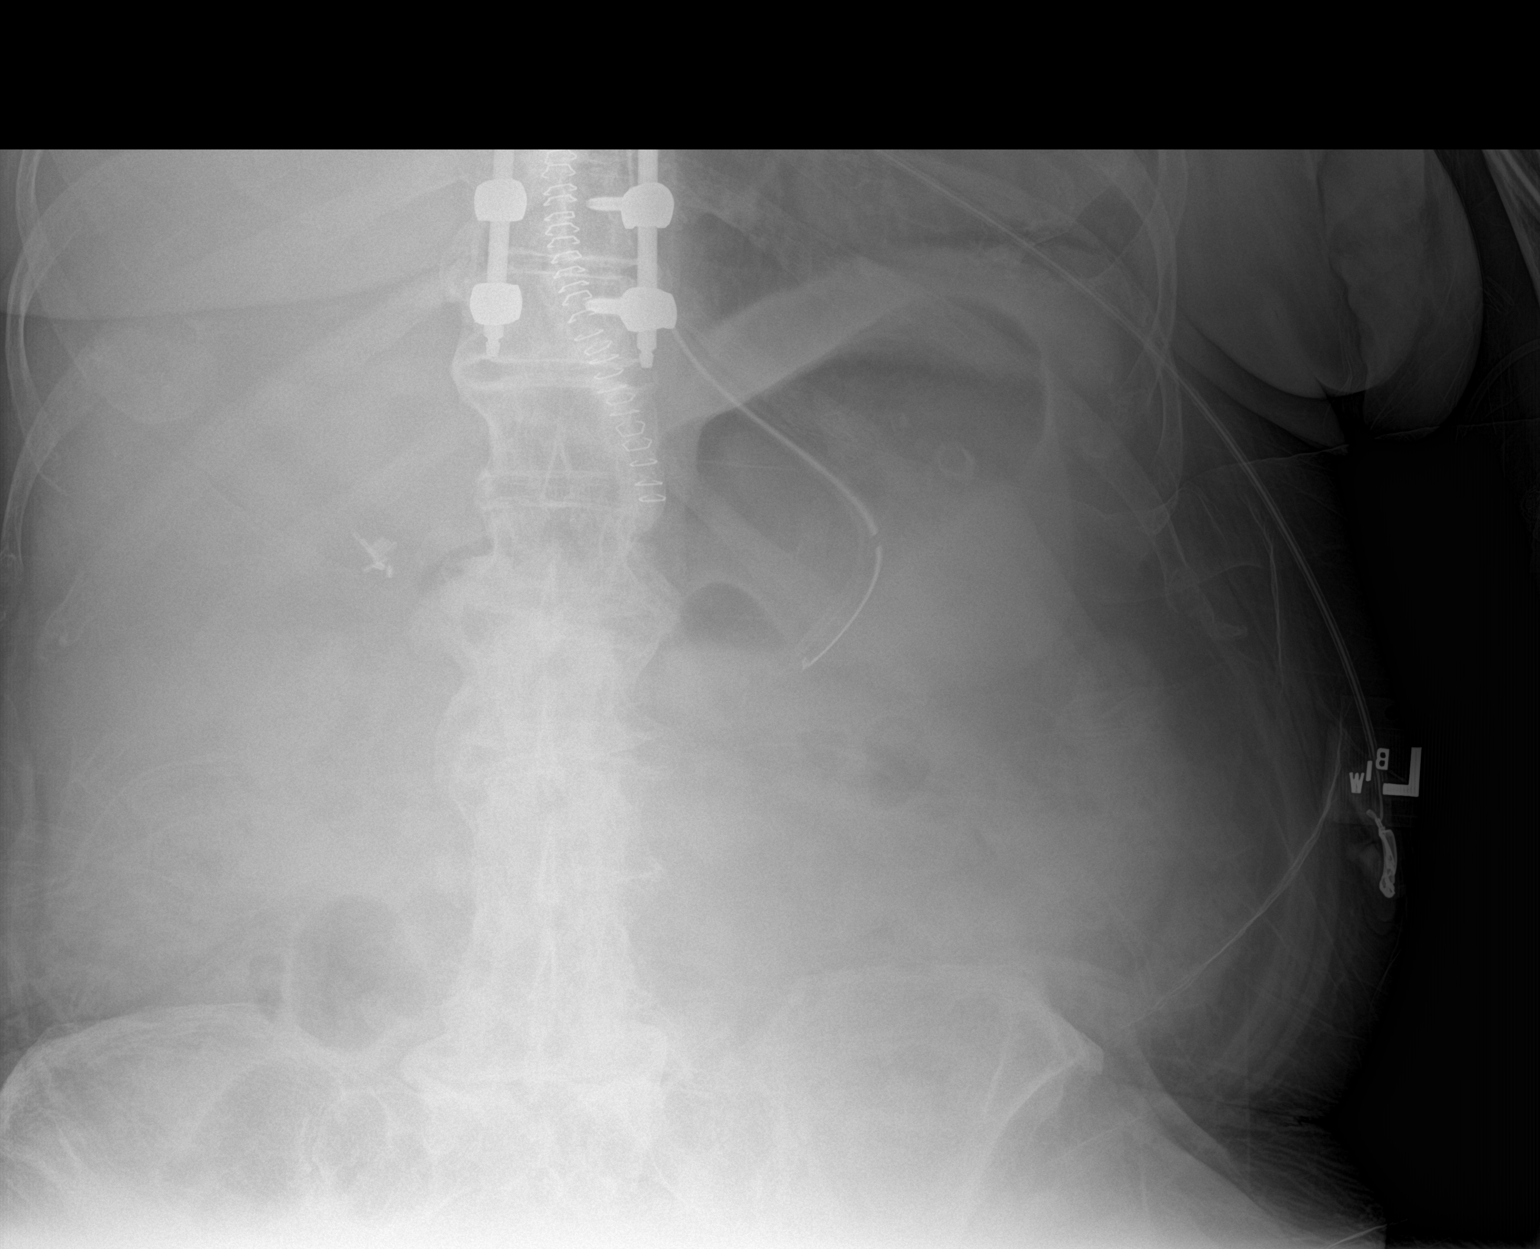

[1 of 1 positions shown; findings below may reference images not displayed]

FINDINGS: Partially visualized thoracic spinal hardware. Esophageal tube tip
and side port overlie the mid stomach. Gas pattern unremarkable
IMPRESSION: Esophageal tube tip and side port overlie the mid stomach.

## 2020-08-01 IMAGING — CR DG FOREARM 2V*L*
4 series · 4 of 4 positions shown · non-contrast
Comparison: None.

CLINICAL DATA: Fall yesterday with swelling.

EXAM:
LEFT FOREARM - 2 VIEW

[x forearm ap left]
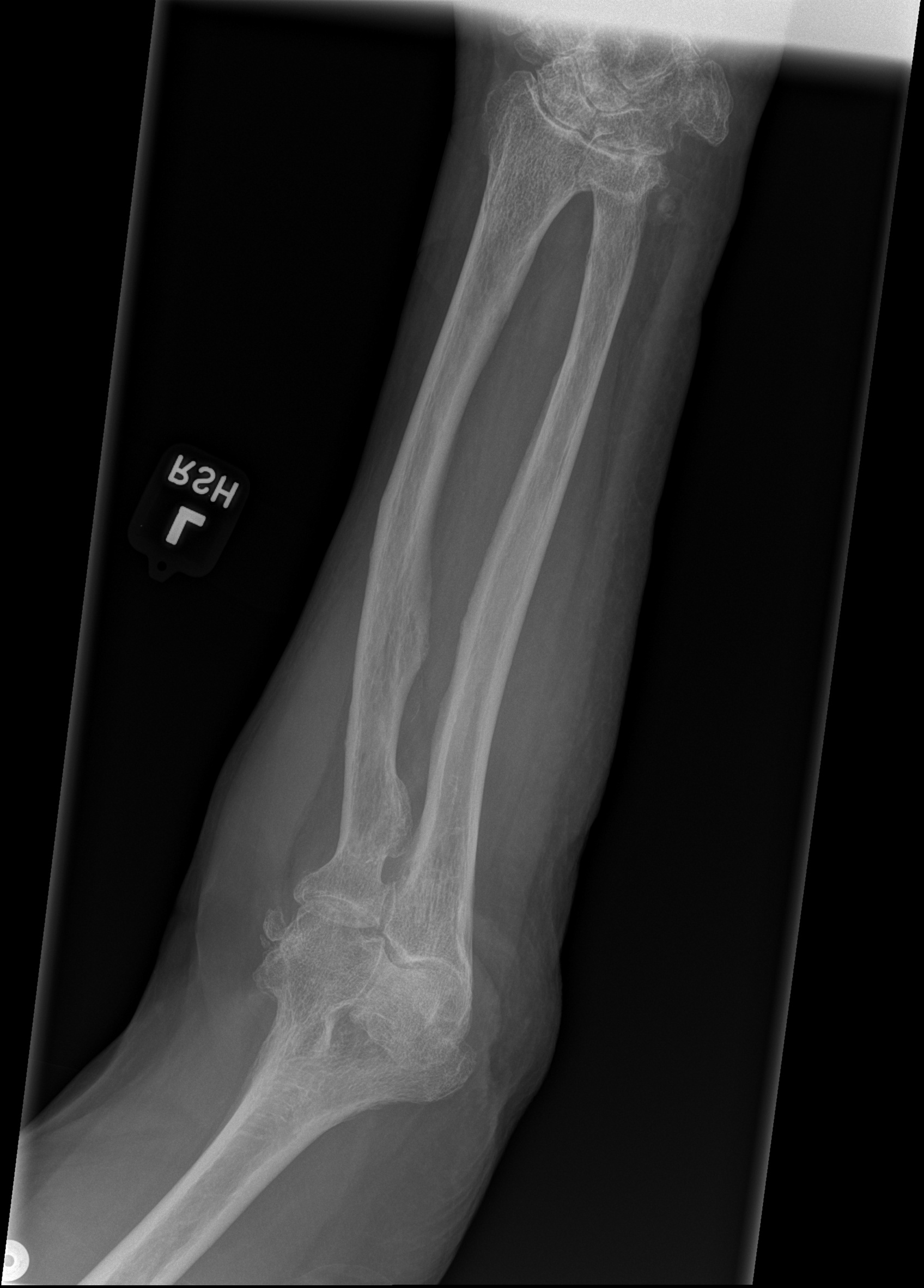

[x forearm lat left (1 of 3)]
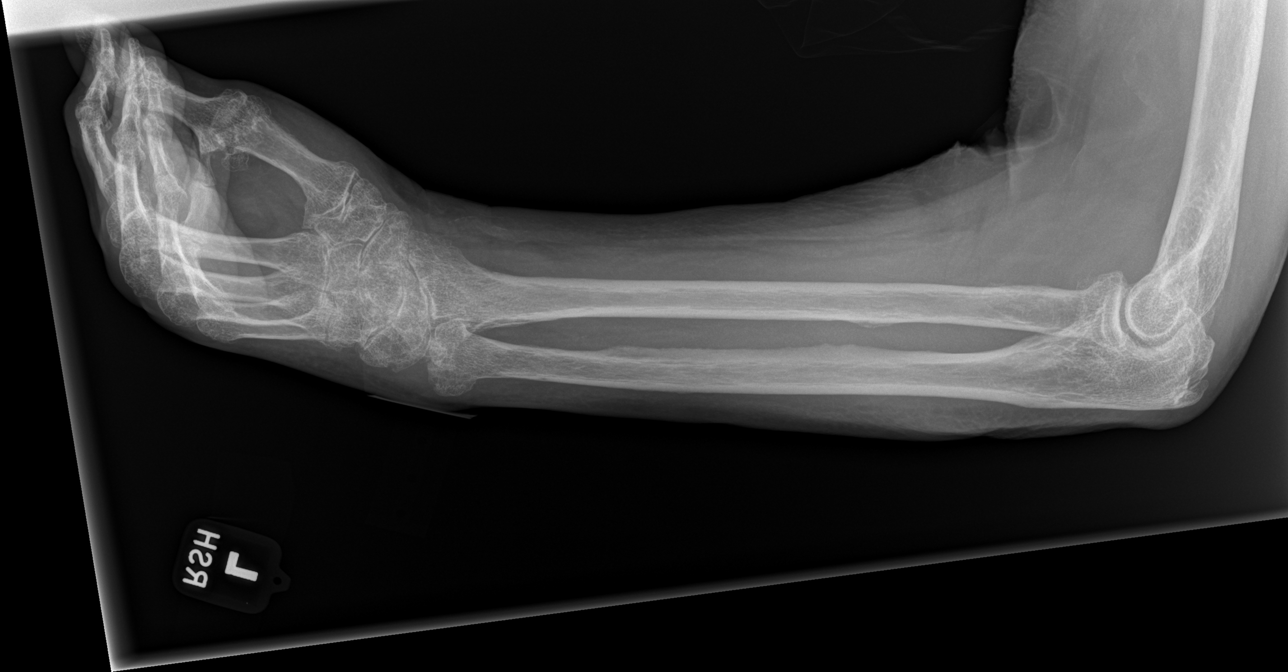

[x forearm lat left (2 of 3)]
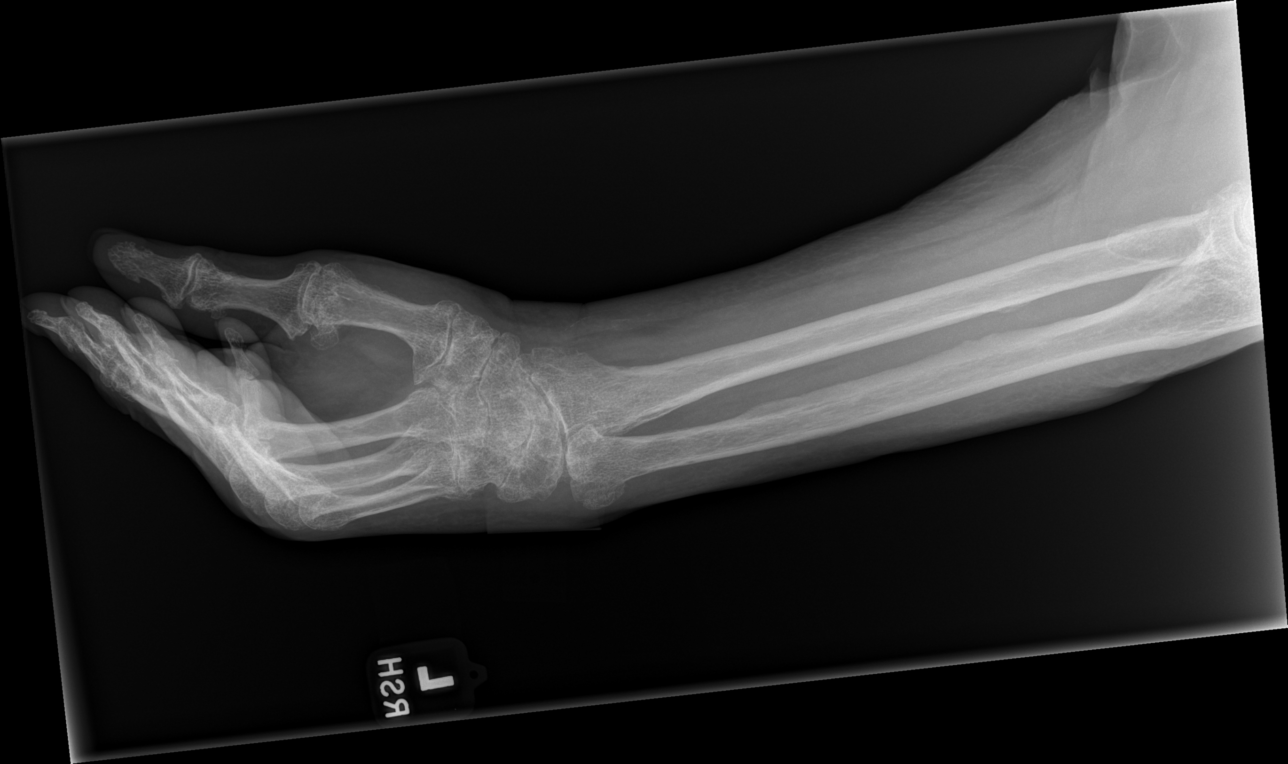

[x forearm lat left (3 of 3)]
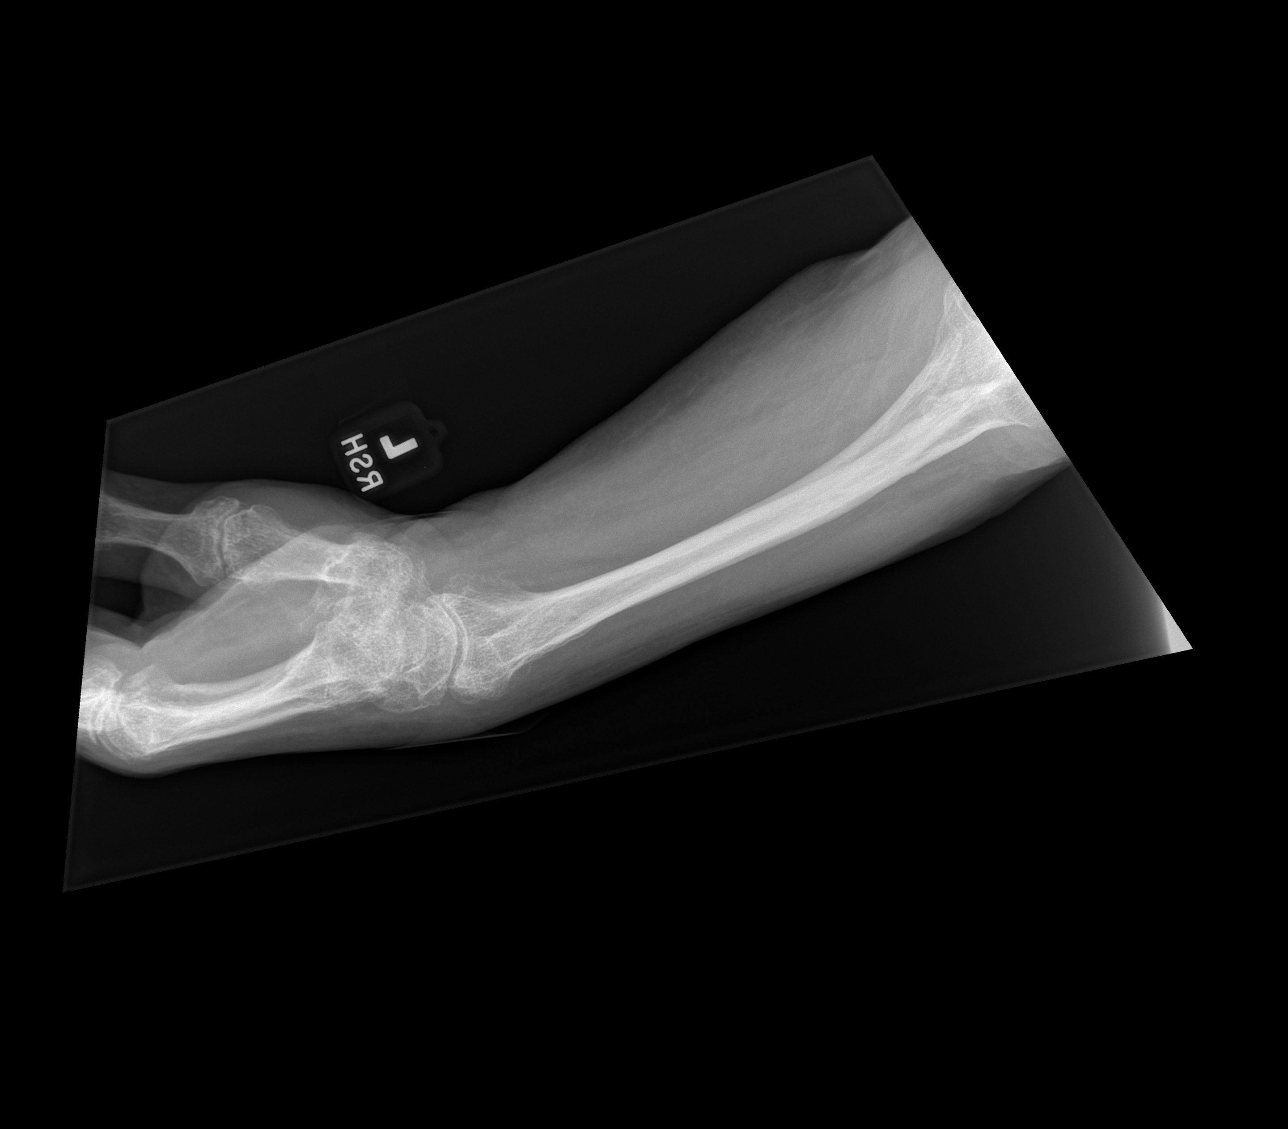

[4 of 4 positions shown; findings below may reference images not displayed]

FINDINGS: Advanced degenerative changes involving the carpal bones and
radiocarpal articulation. There is also more mild to moderate
degenerative change about the elbow. No acute fracture or
dislocation. No elbow joint effusion. Suboptimal patient positioning
secondary to clinical status. Possible soft tissue swelling adjacent
the proximal ulna.
IMPRESSION: Advanced degenerative change, without acute osseous abnormality.

## 2020-08-01 SURGERY — LAMINECTOMY WITH POSTERIOR LATERAL ARTHRODESIS LEVEL 4
Anesthesia: General

## 2020-08-01 MED ORDER — ROCURONIUM BROMIDE 10 MG/ML (PF) SYRINGE
PREFILLED_SYRINGE | INTRAVENOUS | Status: DC | PRN
  Administered 2020-08-01: 30 mg via INTRAVENOUS
  Administered 2020-08-01: 20 mg via INTRAVENOUS

## 2020-08-01 MED ORDER — LIDOCAINE-EPINEPHRINE 1 %-1:100000 IJ SOLN
INTRAMUSCULAR | Status: AC
  Filled 2020-08-01: qty 1

## 2020-08-01 MED ORDER — FENTANYL CITRATE (PF) 250 MCG/5ML IJ SOLN
INTRAMUSCULAR | Status: AC
  Filled 2020-08-01: qty 5

## 2020-08-01 MED ORDER — ONDANSETRON HCL 4 MG/2ML IJ SOLN
INTRAMUSCULAR | Status: AC
  Filled 2020-08-01: qty 2

## 2020-08-01 MED ORDER — MOMETASONE FUROATE 0.1 % EX CREA
1.0000 "application " | TOPICAL_CREAM | Freq: Every day | CUTANEOUS | Status: DC | PRN
  Filled 2020-08-01: qty 15

## 2020-08-01 MED ORDER — SODIUM CHLORIDE 0.9 % IV SOLN
1000.0000 mL | INTRAVENOUS | Status: DC
  Administered 2020-08-01 – 2020-08-02 (×2): 1000 mL via INTRAVENOUS

## 2020-08-01 MED ORDER — THROMBIN 5000 UNITS EX SOLR
CUTANEOUS | Status: AC
  Filled 2020-08-01: qty 5000

## 2020-08-01 MED ORDER — PROPOFOL 10 MG/ML IV BOLUS
INTRAVENOUS | Status: DC | PRN
  Administered 2020-08-01: 100 mg via INTRAVENOUS

## 2020-08-01 MED ORDER — FENTANYL CITRATE (PF) 100 MCG/2ML IJ SOLN: 25.0000 ug | INTRAMUSCULAR | Status: DC | PRN

## 2020-08-01 MED ORDER — DEXAMETHASONE SODIUM PHOSPHATE 10 MG/ML IJ SOLN
INTRAMUSCULAR | Status: DC | PRN
  Administered 2020-08-01: 10 mg via INTRAVENOUS

## 2020-08-01 MED ORDER — OXYCODONE HCL 5 MG/5ML PO SOLN: 5.0000 mg | Freq: Once | ORAL | Status: DC | PRN

## 2020-08-01 MED ORDER — ALBUMIN HUMAN 5 % IV SOLN: INTRAVENOUS | Status: DC | PRN

## 2020-08-01 MED ORDER — PHENYLEPHRINE 40 MCG/ML (10ML) SYRINGE FOR IV PUSH (FOR BLOOD PRESSURE SUPPORT)
PREFILLED_SYRINGE | INTRAVENOUS | Status: DC | PRN
  Administered 2020-08-01: 120 ug via INTRAVENOUS
  Administered 2020-08-01: 80 ug via INTRAVENOUS
  Administered 2020-08-01 (×2): 120 ug via INTRAVENOUS

## 2020-08-01 MED ORDER — SENNOSIDES-DOCUSATE SODIUM 8.6-50 MG PO TABS: 2.0000 | ORAL_TABLET | Freq: Every day | ORAL | Status: DC

## 2020-08-01 MED ORDER — MORPHINE SULFATE (PF) 4 MG/ML IV SOLN
4.0000 mg | Freq: Once | INTRAVENOUS | Status: AC
  Administered 2020-08-01: 4 mg via INTRAVENOUS
  Filled 2020-08-01: qty 1

## 2020-08-01 MED ORDER — CEFAZOLIN SODIUM-DEXTROSE 2-3 GM-%(50ML) IV SOLR
INTRAVENOUS | Status: DC | PRN
  Administered 2020-08-01: 2 g via INTRAVENOUS

## 2020-08-01 MED ORDER — PHENOL 1.4 % MT LIQD
1.0000 | OROMUCOSAL | Status: DC | PRN
  Administered 2020-08-01: 1 via OROMUCOSAL
  Filled 2020-08-01: qty 177

## 2020-08-01 MED ORDER — LIDOCAINE-EPINEPHRINE 1 %-1:100000 IJ SOLN
INTRAMUSCULAR | Status: DC | PRN
  Administered 2020-08-01: 5 mL

## 2020-08-01 MED ORDER — CLONIDINE HCL 0.2 MG PO TABS
0.2000 mg | ORAL_TABLET | Freq: Two times a day (BID) | ORAL | Status: DC
  Administered 2020-08-01 – 2020-08-02 (×3): 0.2 mg
  Filled 2020-08-01 (×3): qty 1

## 2020-08-01 MED ORDER — ONDANSETRON HCL 4 MG/2ML IJ SOLN
INTRAMUSCULAR | Status: DC | PRN
  Administered 2020-08-01: 4 mg via INTRAVENOUS

## 2020-08-01 MED ORDER — HYDROMORPHONE HCL 1 MG/ML IJ SOLN
1.0000 mg | INTRAMUSCULAR | Status: DC | PRN
  Administered 2020-08-01 – 2020-08-03 (×3): 1 mg via INTRAVENOUS
  Filled 2020-08-01 (×3): qty 1

## 2020-08-01 MED ORDER — FENTANYL CITRATE (PF) 100 MCG/2ML IJ SOLN
INTRAMUSCULAR | Status: DC | PRN
  Administered 2020-08-01: 25 ug via INTRAVENOUS
  Administered 2020-08-01: 100 ug via INTRAVENOUS
  Administered 2020-08-01: 25 ug via INTRAVENOUS

## 2020-08-01 MED ORDER — ROCURONIUM BROMIDE 10 MG/ML (PF) SYRINGE
PREFILLED_SYRINGE | INTRAVENOUS | Status: AC
  Filled 2020-08-01: qty 10

## 2020-08-01 MED ORDER — MIDAZOLAM HCL 2 MG/2ML IJ SOLN
INTRAMUSCULAR | Status: AC
  Filled 2020-08-01: qty 2

## 2020-08-01 MED ORDER — ONDANSETRON HCL 4 MG/2ML IJ SOLN
4.0000 mg | Freq: Four times a day (QID) | INTRAMUSCULAR | Status: DC | PRN
  Administered 2020-08-01 – 2020-08-06 (×4): 4 mg via INTRAVENOUS
  Filled 2020-08-01 (×4): qty 2

## 2020-08-01 MED ORDER — PHENYLEPHRINE HCL-NACL 10-0.9 MG/250ML-% IV SOLN
INTRAVENOUS | Status: DC | PRN
  Administered 2020-08-01: 80 ug/min via INTRAVENOUS

## 2020-08-01 MED ORDER — BUPIVACAINE HCL (PF) 0.5 % IJ SOLN
INTRAMUSCULAR | Status: AC
  Filled 2020-08-01: qty 30

## 2020-08-01 MED ORDER — OXYCODONE HCL 5 MG PO TABS: 5.0000 mg | ORAL_TABLET | ORAL | Status: DC | PRN

## 2020-08-01 MED ORDER — HYDROMORPHONE HCL 1 MG/ML IJ SOLN
0.5000 mg | Freq: Once | INTRAMUSCULAR | Status: AC
  Administered 2020-08-01: 0.5 mg via INTRAVENOUS
  Filled 2020-08-01: qty 0.5

## 2020-08-01 MED ORDER — SODIUM CHLORIDE 0.9% IV SOLUTION: Freq: Once | INTRAVENOUS | Status: DC

## 2020-08-01 MED ORDER — SODIUM POLYSTYRENE SULFONATE 15 GM/60ML PO SUSP
30.0000 g | Freq: Once | ORAL | Status: AC
  Administered 2020-08-01: 30 g
  Filled 2020-08-01: qty 120

## 2020-08-01 MED ORDER — LACTATED RINGERS IV SOLN: INTRAVENOUS | Status: DC

## 2020-08-01 MED ORDER — OXYCODONE HCL 5 MG PO TABS
5.0000 mg | ORAL_TABLET | ORAL | Status: DC | PRN
  Administered 2020-08-03: 5 mg
  Filled 2020-08-01: qty 1

## 2020-08-01 MED ORDER — CLONIDINE HCL 0.2 MG PO TABS: 0.2000 mg | ORAL_TABLET | Freq: Two times a day (BID) | ORAL | Status: DC

## 2020-08-01 MED ORDER — LIDOCAINE 2% (20 MG/ML) 5 ML SYRINGE
INTRAMUSCULAR | Status: DC | PRN
  Administered 2020-08-01: 40 mg via INTRAVENOUS

## 2020-08-01 MED ORDER — BACITRACIN ZINC 500 UNIT/GM EX OINT
TOPICAL_OINTMENT | CUTANEOUS | Status: DC | PRN
  Administered 2020-08-01: 1 via TOPICAL

## 2020-08-01 MED ORDER — OXYCODONE HCL 5 MG PO TABS
5.0000 mg | ORAL_TABLET | Freq: Once | ORAL | Status: DC | PRN
Start: 2020-08-01 — End: 2020-08-01

## 2020-08-01 MED ORDER — CLONIDINE HCL 0.1 MG PO TABS: 0.3000 mg | ORAL_TABLET | Freq: Two times a day (BID) | ORAL | Status: DC

## 2020-08-01 MED ORDER — THROMBIN 5000 UNITS EX SOLR
OROMUCOSAL | Status: DC | PRN
  Administered 2020-08-01: 5 mL via TOPICAL

## 2020-08-01 MED ORDER — CHLORHEXIDINE GLUCONATE 0.12 % MT SOLN
OROMUCOSAL | Status: AC
  Administered 2020-08-01: 15 mL via OROMUCOSAL
  Filled 2020-08-01: qty 15

## 2020-08-01 MED ORDER — CHLORHEXIDINE GLUCONATE 0.12 % MT SOLN: 15.0000 mL | Freq: Once | OROMUCOSAL | Status: AC

## 2020-08-01 MED ORDER — INSULIN ASPART 100 UNIT/ML IJ SOLN
0.0000 [IU] | Freq: Every day | INTRAMUSCULAR | Status: DC
Start: 2020-08-01 — End: 2020-08-03
  Administered 2020-08-02: 0 [IU] via SUBCUTANEOUS

## 2020-08-01 MED ORDER — BUPIVACAINE HCL (PF) 0.5 % IJ SOLN
INTRAMUSCULAR | Status: DC | PRN
  Administered 2020-08-01: 5 mL

## 2020-08-01 MED ORDER — 0.9 % SODIUM CHLORIDE (POUR BTL) OPTIME
TOPICAL | Status: DC | PRN
  Administered 2020-08-01: 1000 mL

## 2020-08-01 MED ORDER — BACITRACIN ZINC 500 UNIT/GM EX OINT
TOPICAL_OINTMENT | CUTANEOUS | Status: AC
  Filled 2020-08-01: qty 28.35

## 2020-08-01 MED ORDER — DEXAMETHASONE SODIUM PHOSPHATE 10 MG/ML IJ SOLN
INTRAMUSCULAR | Status: AC
  Filled 2020-08-01: qty 1

## 2020-08-01 MED ORDER — SENNOSIDES-DOCUSATE SODIUM 8.6-50 MG PO TABS
2.0000 | ORAL_TABLET | Freq: Every day | ORAL | Status: DC
  Administered 2020-08-01 – 2020-08-02 (×2): 2
  Filled 2020-08-01 (×2): qty 2

## 2020-08-01 MED ORDER — ATENOLOL 50 MG PO TABS
100.0000 mg | ORAL_TABLET | Freq: Every day | ORAL | Status: DC
  Administered 2020-08-01 – 2020-08-02 (×2): 100 mg
  Filled 2020-08-01: qty 1
  Filled 2020-08-01: qty 2

## 2020-08-01 MED ORDER — INSULIN ASPART 100 UNIT/ML IJ SOLN
0.0000 [IU] | Freq: Three times a day (TID) | INTRAMUSCULAR | Status: DC
  Administered 2020-08-01: 1 [IU] via SUBCUTANEOUS

## 2020-08-01 MED ORDER — EPHEDRINE SULFATE-NACL 50-0.9 MG/10ML-% IV SOSY
PREFILLED_SYRINGE | INTRAVENOUS | Status: DC | PRN
  Administered 2020-08-01: 20 mg via INTRAVENOUS

## 2020-08-01 MED ORDER — PROPOFOL 10 MG/ML IV BOLUS
INTRAVENOUS | Status: AC
  Filled 2020-08-01: qty 20

## 2020-08-01 MED ORDER — EPINEPHRINE 1 MG/10ML IJ SOSY
PREFILLED_SYRINGE | INTRAMUSCULAR | Status: DC | PRN
  Administered 2020-08-01: .05 mg via INTRAVENOUS

## 2020-08-01 MED ORDER — LACTATED RINGERS IV SOLN: INTRAVENOUS | Status: DC | PRN

## 2020-08-01 MED ORDER — LIDOCAINE 2% (20 MG/ML) 5 ML SYRINGE
INTRAMUSCULAR | Status: AC
  Filled 2020-08-01: qty 5

## 2020-08-01 MED ORDER — BARIUM SULFATE 2.1 % PO SUSP
ORAL | Status: AC
  Filled 2020-08-01: qty 2

## 2020-08-01 MED ORDER — ORAL CARE MOUTH RINSE: 15.0000 mL | Freq: Once | OROMUCOSAL | Status: AC

## 2020-08-01 MED ORDER — INSULIN ASPART 100 UNIT/ML IJ SOLN
0.0000 [IU] | Freq: Three times a day (TID) | INTRAMUSCULAR | Status: DC
  Administered 2020-08-01: 2 [IU] via SUBCUTANEOUS
  Administered 2020-08-02: 1 [IU] via SUBCUTANEOUS

## 2020-08-01 MED ORDER — FENTANYL CITRATE (PF) 100 MCG/2ML IJ SOLN
25.0000 ug | INTRAMUSCULAR | Status: DC | PRN
  Administered 2020-08-01: 50 ug via INTRAVENOUS
  Filled 2020-08-01: qty 2

## 2020-08-01 MED ORDER — SODIUM CHLORIDE 0.9 % IV SOLN: INTRAVENOUS | Status: DC | PRN

## 2020-08-01 MED ORDER — ATENOLOL 100 MG PO TABS
100.0000 mg | ORAL_TABLET | Freq: Every day | ORAL | Status: DC
  Filled 2020-08-01: qty 1

## 2020-08-01 MED ORDER — ONDANSETRON HCL 4 MG/2ML IJ SOLN: 4.0000 mg | Freq: Four times a day (QID) | INTRAMUSCULAR | Status: DC | PRN

## 2020-08-01 SURGICAL SUPPLY — 77 items
ADH SKN CLS APL DERMABOND .7 (GAUZE/BANDAGES/DRESSINGS)
APL SKNCLS STERI-STRIP NONHPOA (GAUZE/BANDAGES/DRESSINGS) ×1
BASKET BONE COLLECTION (BASKET) ×1 IMPLANT
BENZOIN TINCTURE PRP APPL 2/3 (GAUZE/BANDAGES/DRESSINGS) ×1 IMPLANT
BLADE CLIPPER SURG (BLADE) IMPLANT
BLADE SURG 11 STRL SS (BLADE) ×2 IMPLANT
BUR MATCHSTICK NEURO 3.0 LAGG (BURR) ×1 IMPLANT
BUR PRECISION FLUTE 5.0 (BURR) ×1 IMPLANT
CANISTER SUCT 3000ML PPV (MISCELLANEOUS) ×2 IMPLANT
CARTRIDGE OIL MAESTRO DRILL (MISCELLANEOUS) ×2 IMPLANT
CNTNR URN SCR LID CUP LEK RST (MISCELLANEOUS) ×1 IMPLANT
CONT SPEC 4OZ STRL OR WHT (MISCELLANEOUS) ×2
COVER BACK TABLE 60X90IN (DRAPES) ×5 IMPLANT
COVER WAND RF STERILE (DRAPES) ×2 IMPLANT
DECANTER SPIKE VIAL GLASS SM (MISCELLANEOUS) ×2 IMPLANT
DERMABOND ADVANCED (GAUZE/BANDAGES/DRESSINGS)
DERMABOND ADVANCED .7 DNX12 (GAUZE/BANDAGES/DRESSINGS) ×1 IMPLANT
DIFFUSER DRILL AIR PNEUMATIC (MISCELLANEOUS) ×4 IMPLANT
DRAPE 3/4 80X56 (DRAPES) ×2 IMPLANT
DRAPE C-ARM 42X72 X-RAY (DRAPES) ×3 IMPLANT
DRAPE C-ARMOR (DRAPES) ×1 IMPLANT
DRAPE HALF SHEET 70X43 (DRAPES) ×3 IMPLANT
DRAPE LAPAROTOMY 100X72X124 (DRAPES) ×2 IMPLANT
DRAPE SCAN PATIENT (DRAPES) ×1 IMPLANT
DRAPE SURG 17X23 STRL (DRAPES) ×2 IMPLANT
DRSG OPSITE POSTOP 4X10 (GAUZE/BANDAGES/DRESSINGS) ×1 IMPLANT
DURAPREP 26ML APPLICATOR (WOUND CARE) ×2 IMPLANT
ELECT REM PT RETURN 9FT ADLT (ELECTROSURGICAL) ×2
ELECTRODE REM PT RTRN 9FT ADLT (ELECTROSURGICAL) ×1 IMPLANT
EXTENDER TAB GUIDE SV 5.5/6.0 (INSTRUMENTS) ×16 IMPLANT
GAUZE 4X4 16PLY RFD (DISPOSABLE) ×1 IMPLANT
GAUZE SPONGE 4X4 12PLY STRL (GAUZE/BANDAGES/DRESSINGS) IMPLANT
GLOVE BIO SURGEON STRL SZ7.5 (GLOVE) ×1 IMPLANT
GLOVE BIOGEL PI IND STRL 7.5 (GLOVE) ×2 IMPLANT
GLOVE BIOGEL PI INDICATOR 7.5 (GLOVE) ×4
GLOVE ECLIPSE 7.0 STRL STRAW (GLOVE) ×4 IMPLANT
GLOVE EXAM NITRILE XL STR (GLOVE) IMPLANT
GLOVE SURG UNDER POLY LF SZ7 (GLOVE) ×1 IMPLANT
GOWN STRL REUS W/ TWL LRG LVL3 (GOWN DISPOSABLE) ×4 IMPLANT
GOWN STRL REUS W/ TWL XL LVL3 (GOWN DISPOSABLE) IMPLANT
GOWN STRL REUS W/TWL 2XL LVL3 (GOWN DISPOSABLE) ×1 IMPLANT
GOWN STRL REUS W/TWL LRG LVL3 (GOWN DISPOSABLE) ×8
GOWN STRL REUS W/TWL XL LVL3 (GOWN DISPOSABLE) ×2
HEMOSTAT POWDER KIT SURGIFOAM (HEMOSTASIS) ×2 IMPLANT
KIT BASIN OR (CUSTOM PROCEDURE TRAY) ×2 IMPLANT
KIT POSITION SURG JACKSON T1 (MISCELLANEOUS) ×2 IMPLANT
KIT TURNOVER KIT B (KITS) ×2 IMPLANT
MARKER SPHERE PSV REFLC 13MM (MARKER) ×5 IMPLANT
MILL MEDIUM DISP (BLADE) ×1 IMPLANT
NDL HYPO 18GX1.5 BLUNT FILL (NEEDLE) IMPLANT
NDL SPNL 18GX3.5 QUINCKE PK (NEEDLE) IMPLANT
NEEDLE HYPO 18GX1.5 BLUNT FILL (NEEDLE) IMPLANT
NEEDLE HYPO 22GX1.5 SAFETY (NEEDLE) ×2 IMPLANT
NEEDLE SPNL 18GX3.5 QUINCKE PK (NEEDLE) ×4 IMPLANT
NS IRRIG 1000ML POUR BTL (IV SOLUTION) ×2 IMPLANT
OIL CARTRIDGE MAESTRO DRILL (MISCELLANEOUS)
PACK LAMINECTOMY NEURO (CUSTOM PROCEDURE TRAY) ×2 IMPLANT
PAD ARMBOARD 7.5X6 YLW CONV (MISCELLANEOUS) ×3 IMPLANT
ROD PERC STRT 5.5X90 (Rod) ×2 IMPLANT
ROD PERC STRT 5.5X90 NS (Rod) IMPLANT
ROD STRT PERC 5.5X100 (Rod) ×1 IMPLANT
SCREW MAS VOYAGER 4.5X35 (Screw) ×2 IMPLANT
SCREW MAS VOYAGER 4.5X40 (Screw) ×2 IMPLANT
SCREW MAS VOYAGER 4.5X45 (Screw) ×2 IMPLANT
SCREW MAS VOYAGER 5.5X45 (Screw) ×2 IMPLANT
SCREW SET 5.5/6.0MM SOLERA (Screw) ×8 IMPLANT
SPONGE LAP 4X18 RFD (DISPOSABLE) IMPLANT
SPONGE SURGIFOAM ABS GEL 100 (HEMOSTASIS) IMPLANT
STRIP CLOSURE SKIN 1/2X4 (GAUZE/BANDAGES/DRESSINGS) ×1 IMPLANT
SUT VIC AB 0 CT1 18XCR BRD8 (SUTURE) ×1 IMPLANT
SUT VIC AB 0 CT1 8-18 (SUTURE) ×2
SUT VICRYL 3-0 RB1 18 ABS (SUTURE) ×3 IMPLANT
SYR 3ML LL SCALE MARK (SYRINGE) ×3 IMPLANT
TOWEL GREEN STERILE (TOWEL DISPOSABLE) ×2 IMPLANT
TOWEL GREEN STERILE FF (TOWEL DISPOSABLE) ×2 IMPLANT
TRAY FOLEY MTR SLVR 16FR STAT (SET/KITS/TRAYS/PACK) ×2 IMPLANT
WATER STERILE IRR 1000ML POUR (IV SOLUTION) ×2 IMPLANT

## 2020-08-01 NOTE — Progress Notes (Signed)
Patient is now drinking the second bottle of contrast.

## 2020-08-01 NOTE — Progress Notes (Signed)
OT Cancellation Note  Patient Details Name: Jacqueline Richmond MRN: [id number] DOB: 06-24-1945   Cancelled Treatment:    Reason Eval/Treat Not Completed: Patient not medically ready (OT orders to start tomorrow 5/12 for OT eval.)  Will plan to see tomorrow.  Jefferey Pica, OTR/L Acute Rehabilitation Services Pager: [phone] Office: [place] 08/01/2020, 7:33 AM

## 2020-08-01 NOTE — Consult Note (Signed)
Chief Complaint   Chief Complaint  Patient presents with  . Fall  . Head Injury    HPI   Consult requested by: Dr Randal Buba, EDP Reason for consult: T10 fracture  HPI: Jacqueline Richmond is a 75 y.o. female with history of HTN, DM, CKD III who presented to ED after a fall at home. She went to get out of rocking chair when she lost balance and fell on steps striking head. No LOC. She underwent work up by EDP and was found to have a T10 vertebral body fracture. A NSY consultation was requested.  Work up also notable for a large right pleural effusion. Patient admitted under [facility] for further work up and management.   She complains of severe thoracic back pain. No N/T/W in extremities.  Patient Active Problem List   Diagnosis Date Noted  . T10 vertebral fracture ([place]) 07/31/2020  . Rotator cuff arthropathy of both shoulders 04/15/2019  . History of colonic polyps   . Polyp of transverse colon   . Diverticulosis of colon without hemorrhage   . Difficult intubation   . 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  . Type II or unspecified type diabetes mellitus without mention of complication, not stated as uncontrolled 08/29/2011  . CKD (chronic kidney disease) stage 3, GFR 30-59 ml/min (HCC) 08/29/2011  . ARF (acute renal failure) ([place]) 08/29/2011  . Hyperkalemia 08/29/2011  . HTN (hypertension) 08/29/2011  . Gout 08/29/2011    PMH: Past Medical History:  Diagnosis Date  . Allergy   . Anemia   . Arthritis   . Cataract    biltaerally   . CKD (chronic kidney disease) stage 3, GFR 30-59 ml/min (HCC) 08/29/2011   per patient, kidneys monitored by Dr Posey Pronto endocrinologist ,lov was  05-18-2018, "kidney fx stable"  . Colon polyp   . Complication of anesthesia   . Difficult intubation    small mouth opening  . Diverticulosis   . Gout   . HTN (hypertension) 08/29/2011  . Hyperlipemia   . Hypertension   . Psoriasis   . Type II or unspecified type diabetes mellitus without mention of complication,  not stated as uncontrolled 08/29/2011    PSH: Past Surgical History:  Procedure Laterality Date  . BREAST LUMPECTOMY WITH NEEDLE LOCALIZATION Right 05/07/2012   Procedure: BREAST LUMPECTOMY WITH NEEDLE LOCALIZATION;  Surgeon: Merrie Roof, MD;  Location: [facility];  Service: General;  Laterality: Right;  . CHOLECYSTECTOMY    . COLONOSCOPY  2009   Ardis [place]   . COLONOSCOPY WITH PROPOFOL N/A 05/20/2018   Procedure: COLONOSCOPY WITH PROPOFOL;  Surgeon: Milus Banister, MD;  Location: [facility];  Service: Endoscopy;  Laterality: N/A;  . HERNIA REPAIR    . HIP SURGERY    . POLYPECTOMY  05/20/2018   Procedure: POLYPECTOMY;  Surgeon: Milus Banister, MD;  Location: [facility];  Service: Endoscopy;;  . RHINOPLASTY    . SALPINGOOPHORECTOMY    . TOTAL HIP ARTHROPLASTY Right 2004    Medications Prior to Admission  Medication Sig Dispense Refill Last Dose  . allopurinol (ZYLOPRIM) 300 MG tablet Take 1 tablet (300 mg total) by mouth daily. 90 tablet 1 07/31/2020 at Unknown time  . aspirin EC 81 MG tablet Take 81 mg by mouth daily. Reported on 06/19/2015   07/31/2020 at Unknown time  . atenolol (TENORMIN) 100 MG tablet Take 100 mg by mouth daily.   07/31/2020 at Unknown time  . b complex vitamins tablet Take 1 tablet by mouth  daily.   07/31/2020 at Unknown time  . CINNAMON PO Take 2,000 mg by mouth 2 (two) times daily. Reported on 06/19/2015   07/31/2020 at Unknown time  . cloNIDine (CATAPRES) 0.3 MG tablet Take 0.3 mg by mouth 2 (two) times daily.   07/31/2020 at Unknown time  . furosemide (LASIX) 40 MG tablet Take 40 mg by mouth daily.    07/31/2020 at Unknown time  . Garlic 1941 MG CAPS Take 2,000 mg by mouth 2 (two) times daily.   07/31/2020 at Unknown time  . glipiZIDE (GLUCOTROL XL) 10 MG 24 hr tablet Take 10 mg by mouth daily with breakfast.   07/31/2020 at Unknown time  . loratadine (CLARITIN) 10 MG tablet Take 10 mg by mouth daily.   07/31/2020 at Unknown time  . mometasone (ELOCON) 0.1 % cream Apply  1 application topically daily as needed (psoriasis).    07/30/2020 at Unknown time  . niacin 500 MG tablet Take 500 mg by mouth 2 (two) times daily with a meal. Reported on 06/19/2015   07/31/2020 at Unknown time  . Omega-3 1000 MG CAPS Take 1,000 mg by mouth 2 (two) times daily.   07/31/2020 at Unknown time  . sitaGLIPtin (JANUVIA) 50 MG tablet Take 50 mg by mouth daily.    07/31/2020 at Unknown time  . allopurinol (ZYLOPRIM) 300 MG tablet Take 300 mg by mouth daily.      Marland Kitchen doxycycline (VIBRA-TABS) 100 MG tablet Take 1 tablet (100 mg total) by mouth 2 (two) times daily. 28 tablet 0 More than a month at Unknown time  . fenofibrate 54 MG tablet Take 1 tablet (54 mg total) by mouth at bedtime. 30 tablet 0   . pravastatin (PRAVACHOL) 10 MG tablet Take 1 tablet by mouth daily.   More than a month at Unknown time    SH: Social History   Tobacco Use  . Smoking status: Never Smoker  . Smokeless tobacco: Never Used  Vaping Use  . Vaping Use: Never used  Substance Use Topics  . Alcohol use: Yes    Comment: Drinks on Special Occasions  . Drug use: No    MEDS: Prior to Admission medications   Medication Sig Start Date End Date Taking? Authorizing Provider  allopurinol (ZYLOPRIM) 300 MG tablet Take 1 tablet (300 mg total) by mouth daily. 06/07/19  Yes Lyndal Pulley, DO  aspirin EC 81 MG tablet Take 81 mg by mouth daily. Reported on 06/19/2015   Yes [provider]  atenolol (TENORMIN) 100 MG tablet Take 100 mg by mouth daily.   Yes [provider]  b complex vitamins tablet Take 1 tablet by mouth daily.   Yes [provider]  CINNAMON PO Take 2,000 mg by mouth 2 (two) times daily. Reported on 06/19/2015   Yes [provider]  cloNIDine (CATAPRES) 0.3 MG tablet Take 0.3 mg by mouth 2 (two) times daily.   Yes [provider]  furosemide (LASIX) 40 MG tablet Take 40 mg by mouth daily.    Yes [provider]  Garlic 7408 MG CAPS Take 2,000 mg by mouth  2 (two) times daily.   Yes [provider]  glipiZIDE (GLUCOTROL XL) 10 MG 24 hr tablet Take 10 mg by mouth daily with breakfast.   Yes [provider]  loratadine (CLARITIN) 10 MG tablet Take 10 mg by mouth daily.   Yes [provider]  mometasone (ELOCON) 0.1 % cream Apply 1 application topically daily as needed (  psoriasis).  10/12/13  Yes [provider]  niacin 500 MG tablet Take 500 mg by mouth 2 (two) times daily with a meal. Reported on 06/19/2015   Yes [provider]  Omega-3 1000 MG CAPS Take 1,000 mg by mouth 2 (two) times daily.   Yes [provider]  sitaGLIPtin (JANUVIA) 50 MG tablet Take 50 mg by mouth daily.    Yes [provider]  allopurinol (ZYLOPRIM) 300 MG tablet Take 300 mg by mouth daily.     [provider]  doxycycline (VIBRA-TABS) 100 MG tablet Take 1 tablet (100 mg total) by mouth 2 (two) times daily. 06/09/19   Lyndal Pulley, DO  fenofibrate 54 MG tablet Take 1 tablet (54 mg total) by mouth at bedtime. 09/02/11 05/13/19  Thurnell Lose, MD  pravastatin (PRAVACHOL) 10 MG tablet Take 1 tablet by mouth daily. 10/28/18   [provider]    ALLERGY: Allergies  Allergen Reactions  . Codeine Itching  . Iodinated Diagnostic Agents     Other reaction(s): Unknown  . Pentazocine Other (See Comments)    Hallucinations  . Statins     Social History   Tobacco Use  . Smoking status: Never Smoker  . Smokeless tobacco: Never Used  Substance Use Topics  . Alcohol use: Yes    Comment: Drinks on Special Occasions     Family History  Problem Relation Age of Onset  . Heart disease Father   . Arthritis Sister   . Colon cancer Neg Hx   . Colon polyps Neg Hx   . Esophageal cancer Neg Hx   . Stomach cancer Neg Hx   . Rectal cancer Neg Hx      ROS   Review of Systems  All other systems reviewed and are negative.   Exam   Vitals:   08/01/20 0135 08/01/20 0652  BP: 134/75 (!) 109/53   Pulse: 80 84  Resp: 18 18  Temp: (!) 97.5 F (36.4 C) 98 F (36.7 C)  SpO2: 99% 100%   General appearance: WDWN, NAD Eyes: No scleral injection Cardiovascular: Regular rate and rhythm without murmurs, rubs, gallops. No edema or variciosities. Distal pulses normal. Pulmonary: Effort normal, non-labored breathing Musculoskeletal:     Muscle tone upper extremities: Normal    Muscle tone lower extremities: Normal    Motor exam: 5/5 BUE, pain mediated weakness proximal BLE, at least anti-gravity. Normal distally. Neurological Mental Status:    - Patient is awake, alert, oriented to person, place, month, year, and situation    - Patient is able to give a clear and coherent history.    - No signs of aphasia or neglect Cranial Nerves    - II: Visual Fields are full. PERRL    - III/IV/VI: EOMI without ptosis or diploplia.     - V: Facial sensation is grossly normal    - VII: Facial movement is symmetric.     - VIII: hearing is intact to voice    - X: Uvula elevates symmetrically    - XI: Shoulder shrug is symmetric.    - XII: tongue is midline without atrophy or fasciculations.  Sensory: Sensation grossly intact to LT  Results - Imaging/Labs   Results for orders placed or performed during the hospital encounter of 07/31/20 (from the past 48 hour(s))  CBC     Status: Abnormal   Collection Time: 07/31/20  5:55 PM  Result Value Ref Range   WBC 13.5 (H) 4.0 - 10.5  K/uL   RBC 4.04 3.87 - 5.11 MIL/uL   Hemoglobin 11.2 (L) 12.0 - 15.0 g/dL   HCT 35.1 (L) 36.0 - 46.0 %   MCV 86.9 80.0 - 100.0 fL   MCH 27.7 26.0 - 34.0 pg   MCHC 31.9 30.0 - 36.0 g/dL   RDW 15.0 11.5 - 15.5 %   Platelets 231 150 - 400 K/uL   nRBC 0.0 0.0 - 0.2 %    Comment: Performed at [facility], [address] [place], [place] [address]  Basic metabolic panel     Status: Abnormal   Collection Time: 07/31/20  5:55 PM  Result Value Ref Range   Sodium 135 135 - 145 mmol/L   Potassium 4.5 3.5  - 5.1 mmol/L   Chloride 104 98 - 111 mmol/L   CO2 21 (L) 22 - 32 mmol/L   Glucose, Bld 113 (H) 70 - 99 mg/dL    Comment: Glucose reference range applies only to samples taken after fasting for at least 8 hours.   BUN 61 (H) 8 - 23 mg/dL   Creatinine, Ser 2.05 (H) 0.44 - 1.00 mg/dL   Calcium 9.4 8.9 - 10.3 mg/dL   GFR, Estimated 25 (L) >60 mL/min    Comment: (NOTE) Calculated using the CKD-EPI Creatinine Equation (2021)    Anion gap 10 5 - 15    Comment: Performed at [facility], [address], [place], [place] [address]  Resp Panel by RT-PCR (Flu A&B, Covid) Nasopharyngeal Swab     Status: None   Collection Time: 07/31/20 11:08 PM   Specimen: Nasopharyngeal Swab; Nasopharyngeal(NP) swabs in vial transport medium  Result Value Ref Range   SARS Coronavirus 2 by RT PCR NEGATIVE NEGATIVE    Comment: (NOTE) SARS-CoV-2 target nucleic acids are NOT DETECTED.  The SARS-CoV-2 RNA is generally detectable in upper respiratory specimens during the acute phase of infection. The lowest concentration of SARS-CoV-2 viral copies this assay can detect is 138 copies/mL. A negative result does not preclude SARS-Cov-2 infection and should not be used as the sole basis for treatment or other patient management decisions. A negative result may occur with  improper specimen collection/handling, submission of specimen other than nasopharyngeal swab, presence of viral mutation(s) within the areas targeted by this assay, and inadequate number of viral copies(<138 copies/mL). A negative result must be combined with clinical observations, patient history, and epidemiological information. The expected result is Negative.  Fact Sheet for Patients:  EntrepreneurPulse.com.au  Fact Sheet for Healthcare Providers:  IncredibleEmployment.be  This test is no t yet approved or cleared by the [place] FDA and  has been authorized for detection and/or  diagnosis of SARS-CoV-2 by FDA under an Emergency Use Authorization (EUA). This EUA will remain  in effect (meaning this test can be used) for the duration of the COVID-19 declaration under Section 564(b)(1) of the Act, 21 U.S.C.section 360bbb-3(b)(1), unless the authorization is terminated  or revoked sooner.       Influenza A by PCR NEGATIVE NEGATIVE   Influenza B by PCR NEGATIVE NEGATIVE    Comment: (NOTE) The Xpert Xpress SARS-CoV-2/FLU/RSV plus assay is intended as an aid in the diagnosis of influenza from Nasopharyngeal swab specimens and should not be used as a sole basis for treatment. Nasal washings and aspirates are unacceptable for Xpert Xpress SARS-CoV-2/FLU/RSV testing.  Fact Sheet for Patients: EntrepreneurPulse.com.au  Fact Sheet for Healthcare Providers: IncredibleEmployment.be  This test is not yet approved or cleared by the [place]  FDA and has been authorized for detection and/or diagnosis of SARS-CoV-2 by FDA under an Emergency Use Authorization (EUA). This EUA will remain in effect (meaning this test can be used) for the duration of the COVID-19 declaration under Section 564(b)(1) of the Act, 21 U.S.C. section 360bbb-3(b)(1), unless the authorization is terminated or revoked.  Performed at [facility], [address] [place], [place] [address]   Lactic acid, plasma     Status: None   Collection Time: 08/01/20 12:18 AM  Result Value Ref Range   Lactic Acid, Venous 1.7 0.5 - 1.9 mmol/L    Comment: Performed at [facility], [address], [place], [place] [address]  MRSA PCR Screening     Status: None   Collection Time: 08/01/20  2:59 AM   Specimen: Nasal Mucosa; Nasopharyngeal  Result Value Ref Range   MRSA by PCR NEGATIVE NEGATIVE    Comment:        The GeneXpert MRSA Assay (FDA approved for NASAL specimens only), is one component of a comprehensive MRSA  colonization surveillance program. It is not intended to diagnose MRSA infection nor to guide or monitor treatment for MRSA infections. Performed at [facility], [place] [address], [place], [place] [address]   CBC     Status: Abnormal   Collection Time: 08/01/20  5:59 AM  Result Value Ref Range   WBC 18.5 (H) 4.0 - 10.5 K/uL   RBC 3.05 (L) 3.87 - 5.11 MIL/uL   Hemoglobin 8.6 (L) 12.0 - 15.0 g/dL   HCT 27.4 (L) 36.0 - 46.0 %   MCV 89.8 80.0 - 100.0 fL   MCH 28.2 26.0 - 34.0 pg   MCHC 31.4 30.0 - 36.0 g/dL   RDW 15.0 11.5 - 15.5 %   Platelets 214 150 - 400 K/uL   nRBC 0.0 0.0 - 0.2 %    Comment: Performed at [facility], [place] [address], [place], [place] [address]  Comprehensive metabolic panel     Status: Abnormal   Collection Time: 08/01/20  5:59 AM  Result Value Ref Range   Sodium 137 135 - 145 mmol/L   Potassium 5.6 (H) 3.5 - 5.1 mmol/L   Chloride 107 98 - 111 mmol/L   CO2 22 22 - 32 mmol/L   Glucose, Bld 194 (H) 70 - 99 mg/dL    Comment: Glucose reference range applies only to samples taken after fasting for at least 8 hours.   BUN 68 (H) 8 - 23 mg/dL   Creatinine, Ser 2.68 (H) 0.44 - 1.00 mg/dL   Calcium 8.7 (L) 8.9 - 10.3 mg/dL   Total Protein 6.4 (L) 6.5 - 8.1 g/dL   Albumin 2.7 (L) 3.5 - 5.0 g/dL   AST 21 15 - 41 U/L   ALT 15 0 - 44 U/L   Alkaline Phosphatase 86 38 - 126 U/L   Total Bilirubin 0.7 0.3 - 1.2 mg/dL   GFR, Estimated 18 (L) >60 mL/min    Comment: (NOTE) Calculated using the CKD-EPI Creatinine Equation (2021)    Anion gap 8 5 - 15    Comment: Performed at [facility], [place] [address] [place], [place] [address]  Magnesium     Status: None   Collection Time: 08/01/20  5:59 AM  Result Value Ref Range   Magnesium 1.9 1.7 - 2.4 mg/dL    Comment: Performed at [facility] [address], [place], [place] [address]  Phosphorus  Status: Abnormal   Collection Time: 08/01/20  5:59 AM  Result Value Ref Range   Phosphorus 6.4 (H)  2.5 - 4.6 mg/dL    Comment: Performed at [facility] [address], [place], [place] [address]  Hemoglobin A1c     Status: None   Collection Time: 08/01/20  6:02 AM  Result Value Ref Range   Hgb A1c MFr Bld 5.4 4.8 - 5.6 %    Comment: (NOTE) Pre diabetes:          5.7%-6.4%  Diabetes:              >6.4%  Glycemic control for   <7.0% adults with diabetes    Mean Plasma Glucose 108.28 mg/dL    Comment: Performed at [facility] [address] [place], [place] [address]    DG Ribs Bilateral W/Chest  Result Date: 07/31/2020 CLINICAL DATA:  Fall.  Pain. EXAM: BILATERAL RIBS AND CHEST - 4+ VIEW COMPARISON:  Remote chest radiograph 05/03/2012. No interval chest imaging. FINDINGS: No fracture or other bone lesions are seen involving the ribs. Right pleural effusion at least moderate in size. No pneumothorax. Heart size and mediastinal contours are within normal limits. Aortic atherosclerosis. IMPRESSION: 1. No evidence of rib fracture. 2. Right pleural effusion at least moderate in size. This is of unknown acuity. Electronically Signed   By: Keith Rake M.D.   On: 07/31/2020 20:39   DG Thoracic Spine 2 View  Result Date: 07/31/2020 CLINICAL DATA:  Unwitnessed fall.  Pain. EXAM: THORACIC SPINE 2 VIEWS COMPARISON:  None. FINDINGS: Broad-based dextroscoliotic curvature. No evidence of acute fracture or compression deformity. Flowing anterior osteophytes throughout the thoracic spine. Minimal disc space narrowing at T10-T11 and T11-T12. no paravertebral soft tissue abnormality IMPRESSION: 1. No radiographic evidence of acute fracture of the thoracic spine. 2. Scoliosis and degenerative change. Electronically Signed   By: Keith Rake M.D.   On: 07/31/2020 20:36   DG Lumbar Spine Complete  Result Date: 07/31/2020 CLINICAL DATA:  Unwitnessed fall.  Pain. EXAM: LUMBAR SPINE - COMPLETE 4+ VIEW COMPARISON:  None. FINDINGS: Five non-rib-bearing lumbar vertebra. Straightening of  normal lordosis. Near complete disc space loss at L4-L5 with prominent disc space narrowing at L3-L4 and L5-S1. Endplate spurring with lesser disc space narrowing at L1-L2 and L2-L3. Multilevel facet hypertrophy. Vertebral body heights are preserved. No evidence of fracture. Degenerative change of both sacroiliac joints. IMPRESSION: 1. No fracture of the lumbar spine. 2. Straightening of normal lordosis may be positioning or muscle spasm. Multilevel degenerative disc disease and facet hypertrophy. Electronically Signed   By: Keith Rake M.D.   On: 07/31/2020 20:37   CT Head Wo Contrast  Result Date: 07/31/2020 CLINICAL DATA:  Recent fall from chair, initial encounter EXAM: CT HEAD WITHOUT CONTRAST CT CERVICAL SPINE WITHOUT CONTRAST TECHNIQUE: Multidetector CT imaging of the head and cervical spine was performed following the standard protocol without intravenous contrast. Multiplanar CT image reconstructions of the cervical spine were also generated. COMPARISON:  None. FINDINGS: CT HEAD FINDINGS Brain: No evidence of acute infarction, hemorrhage, hydrocephalus, extra-axial collection or mass lesion/mass effect. Calcification is noted in the midportion of the pons. Mild chronic white matter ischemic changes are noted. Vascular: No hyperdense vessel or unexpected calcification. Skull: Normal. Negative for fracture or focal lesion. Sinuses/Orbits: No acute finding. Other: Mild parietal scalp hematoma is noted in the vertex on the left. CT CERVICAL SPINE FINDINGS Alignment: Mild loss of normal cervical lordosis is noted likely  related to muscular spasm Skull base and vertebrae: 7 cervical segments are well visualized. Vertebral body height is well maintained. Multilevel osteophytic changes and facet hypertrophic changes are noted. No acute fracture or acute facet abnormality is seen. Soft tissues and spinal canal: Surrounding soft tissue structures show vascular calcifications. Enlargement of the right lobe of  the thyroid is noted with evidence of calcifications and a nodule which measures at least 2.5 cm. No other soft tissue abnormality is noted. Upper chest: Visualized lung apices demonstrate evidence of a right-sided pleural effusion. This is of uncertain chronicity. No rib abnormality is noted. Other: None IMPRESSION: CT of the head: Left parietal scalp hematoma. No acute intracranial abnormality is noted. CT of the cervical spine: Multilevel degenerative changes without acute bony abnormality. Right-sided pleural effusion. Prominent right lobe of the thyroid with at least 1 nodule within. Recommend nonemergent thyroid US (ref: J Am Coll Radiol. 2015 Feb;12(2): 143-50). Electronically Signed   By: Inez Catalina M.D.   On: 07/31/2020 18:10   CT Chest Wo Contrast  Result Date: 07/31/2020 CLINICAL DATA:  Fall.  Chest trauma. EXAM: CT CHEST WITHOUT CONTRAST TECHNIQUE: Multidetector CT imaging of the chest was performed following the standard protocol without IV contrast. COMPARISON:  Ribbon thoracic spine radiographs earlier today. FINDINGS: Cardiovascular: Moderate aortic atherosclerosis. No periaortic stranding to suggest injury. There are coronary artery calcifications. Heart is normal in size, slightly displaced into the left chest. No pericardial effusion. Mediastinum/Nodes: Scattered small mediastinal lymph nodes, not enlarged by size criteria. No esophageal wall thickening. Heterogeneously enlarged right lobe of the thyroid gland with scattered curvilinear calcifications. Included left thyroid lobe is normal. Lungs/Pleura: Moderate to large right pleural effusion, heterogeneous density. Areas of increased density within the pleural fluid may represent hemorrhage, collapsed lung or less likely masses. Small amount of pleural fluid appears loculated and tracks anteromedially. No pneumothorax. Left lung is clear. No left pleural effusion. Upper Abdomen: No definite free fluid in the upper abdomen. Diverticulosis  at the splenic flexure of the colon. Cholecystectomy. Musculoskeletal: Bones are diffusely under mineralized. Diffuse thoracic ankylosis. Displaced fracture through T10 vertebral body involves the anterior third, extend into the T9-T10 disc space, and is displaced anteriorly. Fracture extends through anterior osteophytes as well as the anterior superior aspect of T11 vertebral body. No convincing extension to involve the posterior elements. There is adjacent paravertebral hemorrhage and small volume of gas, likely related to escaped vacuum phenomenon. No additional thoracic spine fracture. Diffuse ankylosis with flowing anterior osteophytes. No acute fracture of the ribs, included clavicles or shoulder girdles. No sternal fracture. IMPRESSION: 1. Extension injury of the thoracic spine with displaced T10 vertebral body fracture extending into the T9-T10 disc space and anterior vertebral body. Fracture extends through anterior osteophytes as well as the anterior superior aspect of T11 vertebral body. No convincing extension to involve the posterior elements. There is adjacent paravertebral hemorrhage and small volume of gas, likely related to escaped vacuum phenomenon. Recommend spine consultation. 2. Moderate to large right pleural effusion, heterogeneous density. Areas of increased density within the pleural fluid may represent hemorrhage, collapsed lung or less likely masses. Recommend continued radiographic follow-up to ensure resolution. No pneumothorax or rib fractures. 3. Heterogeneously enlarged right lobe of the thyroid gland with scattered curvilinear calcifications. Recommend thyroid US (ref: J Am Coll Radiol. 2015 Feb;12(2): 143-50). Aortic Atherosclerosis (ICD10-I70.0). Electronically Signed   By: Keith Rake M.D.   On: 07/31/2020 22:29   CT Cervical Spine Wo Contrast  Result Date: 07/31/2020 CLINICAL  DATA:  Recent fall from chair, initial encounter EXAM: CT HEAD WITHOUT CONTRAST CT CERVICAL  SPINE WITHOUT CONTRAST TECHNIQUE: Multidetector CT imaging of the head and cervical spine was performed following the standard protocol without intravenous contrast. Multiplanar CT image reconstructions of the cervical spine were also generated. COMPARISON:  None. FINDINGS: CT HEAD FINDINGS Brain: No evidence of acute infarction, hemorrhage, hydrocephalus, extra-axial collection or mass lesion/mass effect. Calcification is noted in the midportion of the pons. Mild chronic white matter ischemic changes are noted. Vascular: No hyperdense vessel or unexpected calcification. Skull: Normal. Negative for fracture or focal lesion. Sinuses/Orbits: No acute finding. Other: Mild parietal scalp hematoma is noted in the vertex on the left. CT CERVICAL SPINE FINDINGS Alignment: Mild loss of normal cervical lordosis is noted likely related to muscular spasm Skull base and vertebrae: 7 cervical segments are well visualized. Vertebral body height is well maintained. Multilevel osteophytic changes and facet hypertrophic changes are noted. No acute fracture or acute facet abnormality is seen. Soft tissues and spinal canal: Surrounding soft tissue structures show vascular calcifications. Enlargement of the right lobe of the thyroid is noted with evidence of calcifications and a nodule which measures at least 2.5 cm. No other soft tissue abnormality is noted. Upper chest: Visualized lung apices demonstrate evidence of a right-sided pleural effusion. This is of uncertain chronicity. No rib abnormality is noted. Other: None IMPRESSION: CT of the head: Left parietal scalp hematoma. No acute intracranial abnormality is noted. CT of the cervical spine: Multilevel degenerative changes without acute bony abnormality. Right-sided pleural effusion. Prominent right lobe of the thyroid with at least 1 nodule within. Recommend nonemergent thyroid US (ref: J Am Coll Radiol. 2015 Feb;12(2): 143-50). Electronically Signed   By: Inez Catalina M.D.    On: 07/31/2020 18:10   Impression/Plan   75 y.o. female who presented to ED after a mechanical fall yesterday. Work up demonstrated new pleural effusion and T10 fracture with extension into T9-10 disc space in setting of diffuse bridging osteophytes. She has pain mediated weakness BLE, but otherwise is grossly neurologically intact.  T10 fracture with extension into T9-10 disc space in setting of diffuse bridging osteophytes - likely unstable requiring stabilization.  - Continue spine precautions with strict bed rest, HOB flat.  - Dr Kathyrn Sheriff to follow up this am.  Ferne Reus, [facility]

## 2020-08-01 NOTE — Progress Notes (Signed)
Patient started first bottle of contrast.

## 2020-08-01 NOTE — Consult Note (Signed)
Consult Note  LARUTH HANGER 1945/04/22  [id number].    Requesting MD: Dr. Doristine Bosworth Chief Complaint/Reason for Consult: fall  HPI:  Honor Frison is a pleasant 75 year old female with past medical history of T2DM, HTN, CKD 4 who presented to [facility] on 5/10 following an unwitnessed fall without loss of consciousness. She reports feeling off balance when getting up from a chair, reaching out to a rocking chair for support, and falling backwards down 2 steps onto her back hitting the back of her head  Evaluation in the ED was significant for CT chest with moderate to large right pleural effusion and heterogeneously enlarged right lobe of thyroid gland as well as displaced T10 vertebral body fracture extending into the T9-T10 disc space and anterior vertebral body. Fracture extends through anterior osteophytes as well as the anterior superior aspect of T11 vertebral body. CT head nonacute, CT c spine without acute bony abnormality . She has a posterior head laceration which has been closed with staples. Neurosurgery has been consulted with plans for possible surgery.   She complains of severe 10/10 back pain at time of my visit. She denies injury to any extremities with fall and denies new pain in extremities. She has chronic arthritic pain. She has some difficulty breathing. Denies cough. She has new nausea she thinks is secondary to pain with emesis x3. One episode of emesis during my visit - green, bilious. She denies photophobia and headache. She states she has history of diverticulosis and has had abdominal pain and diarrhea for at least one week that she believes is related. Last episode of diarrhea 2 days ago. She denies having had surgery or needing antibiotic treatment for diverticulitis in the past.  Past surgical history: cholecystectomy (2004), bilateral oophorectomy, right total hip arthroplasty Substance use: denies alcohol and tobacco use  From chart review last  colonoscopy 2020 with noncancerous polyp and left sided diverticulosis.  ROS: Review of Systems  Constitutional: Negative for chills, diaphoresis and fever.  Respiratory: Positive for shortness of breath. Negative for cough and wheezing.   Cardiovascular: Positive for leg swelling (chronic). Negative for chest pain, palpitations and orthopnea.  Gastrointestinal: Positive for abdominal pain, diarrhea, nausea and vomiting.  Musculoskeletal: Positive for back pain and joint pain (chronic arthritis). Negative for neck pain.  All other systems reviewed and are negative.   Family History  Problem Relation Age of Onset  . Heart disease Father   . Arthritis Sister   . Colon cancer Neg Hx   . Colon polyps Neg Hx   . Esophageal cancer Neg Hx   . Stomach cancer Neg Hx   . Rectal cancer Neg Hx     Past Medical History:  Diagnosis Date  . Allergy   . Anemia   . Arthritis   . Cataract    biltaerally   . CKD (chronic kidney disease) stage 3, GFR 30-59 ml/min (HCC) 08/29/2011   per patient, kidneys monitored by Dr Posey Pronto endocrinologist ,lov was  05-18-2018, "kidney fx stable"  . Colon polyp   . Complication of anesthesia   . Difficult intubation    small mouth opening  . Diverticulosis   . Gout   . HTN (hypertension) 08/29/2011  . Hyperlipemia   . Hypertension   . Psoriasis   . Type II or unspecified type diabetes mellitus without mention of complication, not stated as uncontrolled 08/29/2011    Past Surgical History:  Procedure Laterality Date  . BREAST LUMPECTOMY WITH  NEEDLE LOCALIZATION Right 05/07/2012   Procedure: BREAST LUMPECTOMY WITH NEEDLE LOCALIZATION;  Surgeon: Merrie Roof, MD;  Location: [facility];  Service: General;  Laterality: Right;  . CHOLECYSTECTOMY    . COLONOSCOPY  2009   Ardis Hughs   . COLONOSCOPY WITH PROPOFOL N/A 05/20/2018   Procedure: COLONOSCOPY WITH PROPOFOL;  Surgeon: Milus Banister, MD;  Location: [facility];  Service: Endoscopy;  Laterality: N/A;  . HERNIA  REPAIR    . HIP SURGERY    . POLYPECTOMY  05/20/2018   Procedure: POLYPECTOMY;  Surgeon: Milus Banister, MD;  Location: [facility];  Service: Endoscopy;;  . RHINOPLASTY    . SALPINGOOPHORECTOMY    . TOTAL HIP ARTHROPLASTY Right 2004    Social History:  reports that she has never smoked. She has never used smokeless tobacco. She reports current alcohol use. She reports that she does not use drugs.  Allergies:  Allergies  Allergen Reactions  . Codeine Itching  . Iodinated Diagnostic Agents     Other reaction(s): Unknown  . Pentazocine Other (See Comments)    Hallucinations  . Statins     Medications Prior to Admission  Medication Sig Dispense Refill  . allopurinol (ZYLOPRIM) 300 MG tablet Take 1 tablet (300 mg total) by mouth daily. 90 tablet 1  . aspirin EC 81 MG tablet Take 81 mg by mouth daily. Reported on 06/19/2015    . atenolol (TENORMIN) 100 MG tablet Take 100 mg by mouth daily.    Marland Kitchen b complex vitamins tablet Take 1 tablet by mouth daily.    Marland Kitchen CINNAMON PO Take 2,000 mg by mouth 2 (two) times daily. Reported on 06/19/2015    . cloNIDine (CATAPRES) 0.3 MG tablet Take 0.3 mg by mouth 2 (two) times daily.    . furosemide (LASIX) 40 MG tablet Take 40 mg by mouth daily.     . Garlic 6761 MG CAPS Take 2,000 mg by mouth 2 (two) times daily.    Marland Kitchen glipiZIDE (GLUCOTROL XL) 10 MG 24 hr tablet Take 10 mg by mouth daily with breakfast.    . loratadine (CLARITIN) 10 MG tablet Take 10 mg by mouth daily.    . mometasone (ELOCON) 0.1 % cream Apply 1 application topically daily as needed (psoriasis).     . niacin 500 MG tablet Take 500 mg by mouth 2 (two) times daily with a meal. Reported on 06/19/2015    . Omega-3 1000 MG CAPS Take 1,000 mg by mouth 2 (two) times daily.    . sitaGLIPtin (JANUVIA) 50 MG tablet Take 50 mg by mouth daily.     Marland Kitchen allopurinol (ZYLOPRIM) 300 MG tablet Take 300 mg by mouth daily.     Marland Kitchen doxycycline (VIBRA-TABS) 100 MG tablet Take 1 tablet (100 mg total) by mouth 2  (two) times daily. 28 tablet 0  . fenofibrate 54 MG tablet Take 1 tablet (54 mg total) by mouth at bedtime. 30 tablet 0  . pravastatin (PRAVACHOL) 10 MG tablet Take 1 tablet by mouth daily.      Blood pressure (!) 109/53, pulse 84, temperature 98 F (36.7 C), resp. rate 18, height 5\' 3"  (1.6 m), weight 84.4 kg, SpO2 100 %. Physical Exam Vitals reviewed.  Constitutional:      Appearance: She is not toxic-appearing or diaphoretic.     Comments: Uncomfortable appearing secondary to pain  HENT:     Right Ear: External ear normal.     Left Ear: External ear normal.  Mouth/Throat:     Mouth: Mucous membranes are moist.  Eyes:     General: No scleral icterus.       Right eye: No discharge.        Left eye: No discharge.     Conjunctiva/sclera: Conjunctivae normal.     Pupils: Pupils are equal, round, and reactive to light.  Cardiovascular:     Rate and Rhythm: Normal rate and regular rhythm.     Pulses: Normal pulses.  Pulmonary:     Effort: Pulmonary effort is normal. No respiratory distress.     Breath sounds: No wheezing.     Comments: Diminished breath sounds on right Abdominal:     General: Bowel sounds are normal. There is no distension.     Palpations: Abdomen is soft. There is no mass.     Tenderness: There is abdominal tenderness (in right upper quadrant). There is no guarding or rebound.     Hernia: No hernia is present.     Comments: Well healed midline abdominal scar.  Musculoskeletal:     Comments: Bilateral pitting lower extremity edema Left wrist swollen without ecchymosis, laceration, erythema or rash. No tenderness to palpation Dried blood and ecchymosis of left elbow  Skin:    General: Skin is warm and dry.  Neurological:     Mental Status: She is alert and oriented to person, place, and time.     Comments: Sensation intact in bilateral upper and lower extremities. Bilateral lower extremities with mobility but weakness secondary to pain. Bilateral upper  extremities with ROM grossly intact and strength intact.  Psychiatric:        Mood and Affect: Mood normal.        Behavior: Behavior normal.       Results for orders placed or performed during the hospital encounter of 07/31/20 (from the past 48 hour(s))  CBC     Status: Abnormal   Collection Time: 07/31/20  5:55 PM  Result Value Ref Range   WBC 13.5 (H) 4.0 - 10.5 K/uL   RBC 4.04 3.87 - 5.11 MIL/uL   Hemoglobin 11.2 (L) 12.0 - 15.0 g/dL   HCT 35.1 (L) 36.0 - 46.0 %   MCV 86.9 80.0 - 100.0 fL   MCH 27.7 26.0 - 34.0 pg   MCHC 31.9 30.0 - 36.0 g/dL   RDW 15.0 11.5 - 15.5 %   Platelets 231 150 - 400 K/uL   nRBC 0.0 0.0 - 0.2 %    Comment: Performed at [facility], [address], [place], [place] [address]  Basic metabolic panel     Status: Abnormal   Collection Time: 07/31/20  5:55 PM  Result Value Ref Range   Sodium 135 135 - 145 mmol/L   Potassium 4.5 3.5 - 5.1 mmol/L   Chloride 104 98 - 111 mmol/L   CO2 21 (L) 22 - 32 mmol/L   Glucose, Bld 113 (H) 70 - 99 mg/dL    Comment: Glucose reference range applies only to samples taken after fasting for at least 8 hours.   BUN 61 (H) 8 - 23 mg/dL   Creatinine, Ser 2.05 (H) 0.44 - 1.00 mg/dL   Calcium 9.4 8.9 - 10.3 mg/dL   GFR, Estimated 25 (L) >60 mL/min    Comment: (NOTE) Calculated using the CKD-EPI Creatinine Equation (2021)    Anion gap 10 5 - 15    Comment: Performed at [facility], [address], [place], [place] [address]  Resp Panel  by RT-PCR (Flu A&B, Covid) Nasopharyngeal Swab     Status: None   Collection Time: 07/31/20 11:08 PM   Specimen: Nasopharyngeal Swab; Nasopharyngeal(NP) swabs in vial transport medium  Result Value Ref Range   SARS Coronavirus 2 by RT PCR NEGATIVE NEGATIVE    Comment: (NOTE) SARS-CoV-2 target nucleic acids are NOT DETECTED.  The SARS-CoV-2 RNA is generally detectable in upper respiratory specimens during the acute phase of infection. The  lowest concentration of SARS-CoV-2 viral copies this assay can detect is 138 copies/mL. A negative result does not preclude SARS-Cov-2 infection and should not be used as the sole basis for treatment or other patient management decisions. A negative result may occur with  improper specimen collection/handling, submission of specimen other than nasopharyngeal swab, presence of viral mutation(s) within the areas targeted by this assay, and inadequate number of viral copies(<138 copies/mL). A negative result must be combined with clinical observations, patient history, and epidemiological information. The expected result is Negative.  Fact Sheet for Patients:  EntrepreneurPulse.com.au  Fact Sheet for Healthcare Providers:  IncredibleEmployment.be  This test is no t yet approved or cleared by the [place] FDA and  has been authorized for detection and/or diagnosis of SARS-CoV-2 by FDA under an Emergency Use Authorization (EUA). This EUA will remain  in effect (meaning this test can be used) for the duration of the COVID-19 declaration under Section 564(b)(1) of the Act, 21 U.S.C.section 360bbb-3(b)(1), unless the authorization is terminated  or revoked sooner.       Influenza A by PCR NEGATIVE NEGATIVE   Influenza B by PCR NEGATIVE NEGATIVE    Comment: (NOTE) The Xpert Xpress SARS-CoV-2/FLU/RSV plus assay is intended as an aid in the diagnosis of influenza from Nasopharyngeal swab specimens and should not be used as a sole basis for treatment. Nasal washings and aspirates are unacceptable for Xpert Xpress SARS-CoV-2/FLU/RSV testing.  Fact Sheet for Patients: EntrepreneurPulse.com.au  Fact Sheet for Healthcare Providers: IncredibleEmployment.be  This test is not yet approved or cleared by the [place] FDA and has been authorized for detection and/or diagnosis of SARS-CoV-2 by FDA under an Emergency  Use Authorization (EUA). This EUA will remain in effect (meaning this test can be used) for the duration of the COVID-19 declaration under Section 564(b)(1) of the Act, 21 U.S.C. section 360bbb-3(b)(1), unless the authorization is terminated or revoked.  Performed at [facility], [address] [place], [place] [address]   Lactic acid, plasma     Status: None   Collection Time: 08/01/20 12:18 AM  Result Value Ref Range   Lactic Acid, Venous 1.7 0.5 - 1.9 mmol/L    Comment: Performed at [facility], [address] [place], [place] [address]  MRSA PCR Screening     Status: None   Collection Time: 08/01/20  2:59 AM   Specimen: Nasal Mucosa; Nasopharyngeal  Result Value Ref Range   MRSA by PCR NEGATIVE NEGATIVE    Comment:        The GeneXpert MRSA Assay (FDA approved for NASAL specimens only), is one component of a comprehensive MRSA colonization surveillance program. It is not intended to diagnose MRSA infection nor to guide or monitor treatment for MRSA infections. Performed at [facility], [place] [address] [place] [address]   CBC     Status: Abnormal   Collection Time: 08/01/20  5:59 AM  Result Value Ref Range   WBC 18.5 (H) 4.0 - 10.5 K/uL   RBC 3.05 (L) 3.87 -  5.11 MIL/uL   Hemoglobin 8.6 (L) 12.0 - 15.0 g/dL   HCT 27.4 (L) 36.0 - 46.0 %   MCV 89.8 80.0 - 100.0 fL   MCH 28.2 26.0 - 34.0 pg   MCHC 31.4 30.0 - 36.0 g/dL   RDW 15.0 11.5 - 15.5 %   Platelets 214 150 - 400 K/uL   nRBC 0.0 0.0 - 0.2 %    Comment: Performed at [facility] [address] [place], [place] [address]  Comprehensive metabolic panel     Status: Abnormal   Collection Time: 08/01/20  5:59 AM  Result Value Ref Range   Sodium 137 135 - 145 mmol/L   Potassium 5.6 (H) 3.5 - 5.1 mmol/L   Chloride 107 98 - 111 mmol/L   CO2 22 22 - 32 mmol/L   Glucose, Bld 194 (H) 70 - 99 mg/dL    Comment: Glucose reference range applies only to  samples taken after fasting for at least 8 hours.   BUN 68 (H) 8 - 23 mg/dL   Creatinine, Ser 2.68 (H) 0.44 - 1.00 mg/dL   Calcium 8.7 (L) 8.9 - 10.3 mg/dL   Total Protein 6.4 (L) 6.5 - 8.1 g/dL   Albumin 2.7 (L) 3.5 - 5.0 g/dL   AST 21 15 - 41 U/L   ALT 15 0 - 44 U/L   Alkaline Phosphatase 86 38 - 126 U/L   Total Bilirubin 0.7 0.3 - 1.2 mg/dL   GFR, Estimated 18 (L) >60 mL/min    Comment: (NOTE) Calculated using the CKD-EPI Creatinine Equation (2021)    Anion gap 8 5 - 15    Comment: Performed at [facility], [place] [address] [place], [place] [address]  Magnesium     Status: None   Collection Time: 08/01/20  5:59 AM  Result Value Ref Range   Magnesium 1.9 1.7 - 2.4 mg/dL    Comment: Performed at [facility] [address], [place], [place] [address]  Phosphorus     Status: Abnormal   Collection Time: 08/01/20  5:59 AM  Result Value Ref Range   Phosphorus 6.4 (H) 2.5 - 4.6 mg/dL    Comment: Performed at [facility] [address] [place], [place] [address]  Hemoglobin A1c     Status: None   Collection Time: 08/01/20  6:02 AM  Result Value Ref Range   Hgb A1c MFr Bld 5.4 4.8 - 5.6 %    Comment: (NOTE) Pre diabetes:          5.7%-6.4%  Diabetes:              >6.4%  Glycemic control for   <7.0% adults with diabetes    Mean Plasma Glucose 108.28 mg/dL    Comment: Performed at [facility] [address], [place], [place] [address]  Glucose, capillary     Status: Abnormal   Collection Time: 08/01/20  8:19 AM  Result Value Ref Range   Glucose-Capillary 158 (H) 70 - 99 mg/dL    Comment: Glucose reference range applies only to samples taken after fasting for at least 8 hours.   DG Ribs Bilateral W/Chest  Result Date: 07/31/2020 CLINICAL DATA:  Fall.  Pain. EXAM: BILATERAL RIBS AND CHEST - 4+ VIEW COMPARISON:  Remote chest radiograph 05/03/2012. No interval chest imaging. FINDINGS: No fracture or other bone lesions are seen involving the ribs.  Right pleural effusion at least moderate in size. No pneumothorax. Heart  size and mediastinal contours are within normal limits. Aortic atherosclerosis. IMPRESSION: 1. No evidence of rib fracture. 2. Right pleural effusion at least moderate in size. This is of unknown acuity. Electronically Signed   By: Keith Rake M.D.   On: 07/31/2020 20:39   DG Thoracic Spine 2 View  Result Date: 07/31/2020 CLINICAL DATA:  Unwitnessed fall.  Pain. EXAM: THORACIC SPINE 2 VIEWS COMPARISON:  None. FINDINGS: Broad-based dextroscoliotic curvature. No evidence of acute fracture or compression deformity. Flowing anterior osteophytes throughout the thoracic spine. Minimal disc space narrowing at T10-T11 and T11-T12. no paravertebral soft tissue abnormality IMPRESSION: 1. No radiographic evidence of acute fracture of the thoracic spine. 2. Scoliosis and degenerative change. Electronically Signed   By: Keith Rake M.D.   On: 07/31/2020 20:36   DG Lumbar Spine Complete  Result Date: 07/31/2020 CLINICAL DATA:  Unwitnessed fall.  Pain. EXAM: LUMBAR SPINE - COMPLETE 4+ VIEW COMPARISON:  None. FINDINGS: Five non-rib-bearing lumbar vertebra. Straightening of normal lordosis. Near complete disc space loss at L4-L5 with prominent disc space narrowing at L3-L4 and L5-S1. Endplate spurring with lesser disc space narrowing at L1-L2 and L2-L3. Multilevel facet hypertrophy. Vertebral body heights are preserved. No evidence of fracture. Degenerative change of both sacroiliac joints. IMPRESSION: 1. No fracture of the lumbar spine. 2. Straightening of normal lordosis may be positioning or muscle spasm. Multilevel degenerative disc disease and facet hypertrophy. Electronically Signed   By: Keith Rake M.D.   On: 07/31/2020 20:37   CT Head Wo Contrast  Result Date: 07/31/2020 CLINICAL DATA:  Recent fall from chair, initial encounter EXAM: CT HEAD WITHOUT CONTRAST CT CERVICAL SPINE WITHOUT CONTRAST TECHNIQUE: Multidetector CT  imaging of the head and cervical spine was performed following the standard protocol without intravenous contrast. Multiplanar CT image reconstructions of the cervical spine were also generated. COMPARISON:  None. FINDINGS: CT HEAD FINDINGS Brain: No evidence of acute infarction, hemorrhage, hydrocephalus, extra-axial collection or mass lesion/mass effect. Calcification is noted in the midportion of the pons. Mild chronic white matter ischemic changes are noted. Vascular: No hyperdense vessel or unexpected calcification. Skull: Normal. Negative for fracture or focal lesion. Sinuses/Orbits: No acute finding. Other: Mild parietal scalp hematoma is noted in the vertex on the left. CT CERVICAL SPINE FINDINGS Alignment: Mild loss of normal cervical lordosis is noted likely related to muscular spasm Skull base and vertebrae: 7 cervical segments are well visualized. Vertebral body height is well maintained. Multilevel osteophytic changes and facet hypertrophic changes are noted. No acute fracture or acute facet abnormality is seen. Soft tissues and spinal canal: Surrounding soft tissue structures show vascular calcifications. Enlargement of the right lobe of the thyroid is noted with evidence of calcifications and a nodule which measures at least 2.5 cm. No other soft tissue abnormality is noted. Upper chest: Visualized lung apices demonstrate evidence of a right-sided pleural effusion. This is of uncertain chronicity. No rib abnormality is noted. Other: None IMPRESSION: CT of the head: Left parietal scalp hematoma. No acute intracranial abnormality is noted. CT of the cervical spine: Multilevel degenerative changes without acute bony abnormality. Right-sided pleural effusion. Prominent right lobe of the thyroid with at least 1 nodule within. Recommend nonemergent thyroid US (ref: J Am Coll Radiol. 2015 Feb;12(2): 143-50). Electronically Signed   By: Inez Catalina M.D.   On: 07/31/2020 18:10   CT Chest Wo  Contrast  Result Date: 07/31/2020 CLINICAL DATA:  Fall.  Chest trauma. EXAM: CT CHEST WITHOUT CONTRAST TECHNIQUE: Multidetector CT imaging of the  chest was performed following the standard protocol without IV contrast. COMPARISON:  Ribbon thoracic spine radiographs earlier today. FINDINGS: Cardiovascular: Moderate aortic atherosclerosis. No periaortic stranding to suggest injury. There are coronary artery calcifications. Heart is normal in size, slightly displaced into the left chest. No pericardial effusion. Mediastinum/Nodes: Scattered small mediastinal lymph nodes, not enlarged by size criteria. No esophageal wall thickening. Heterogeneously enlarged right lobe of the thyroid gland with scattered curvilinear calcifications. Included left thyroid lobe is normal. Lungs/Pleura: Moderate to large right pleural effusion, heterogeneous density. Areas of increased density within the pleural fluid may represent hemorrhage, collapsed lung or less likely masses. Small amount of pleural fluid appears loculated and tracks anteromedially. No pneumothorax. Left lung is clear. No left pleural effusion. Upper Abdomen: No definite free fluid in the upper abdomen. Diverticulosis at the splenic flexure of the colon. Cholecystectomy. Musculoskeletal: Bones are diffusely under mineralized. Diffuse thoracic ankylosis. Displaced fracture through T10 vertebral body involves the anterior third, extend into the T9-T10 disc space, and is displaced anteriorly. Fracture extends through anterior osteophytes as well as the anterior superior aspect of T11 vertebral body. No convincing extension to involve the posterior elements. There is adjacent paravertebral hemorrhage and small volume of gas, likely related to escaped vacuum phenomenon. No additional thoracic spine fracture. Diffuse ankylosis with flowing anterior osteophytes. No acute fracture of the ribs, included clavicles or shoulder girdles. No sternal fracture. IMPRESSION: 1.  Extension injury of the thoracic spine with displaced T10 vertebral body fracture extending into the T9-T10 disc space and anterior vertebral body. Fracture extends through anterior osteophytes as well as the anterior superior aspect of T11 vertebral body. No convincing extension to involve the posterior elements. There is adjacent paravertebral hemorrhage and small volume of gas, likely related to escaped vacuum phenomenon. Recommend spine consultation. 2. Moderate to large right pleural effusion, heterogeneous density. Areas of increased density within the pleural fluid may represent hemorrhage, collapsed lung or less likely masses. Recommend continued radiographic follow-up to ensure resolution. No pneumothorax or rib fractures. 3. Heterogeneously enlarged right lobe of the thyroid gland with scattered curvilinear calcifications. Recommend thyroid US (ref: J Am Coll Radiol. 2015 Feb;12(2): 143-50). Aortic Atherosclerosis (ICD10-I70.0). Electronically Signed   By: Keith Rake M.D.   On: 07/31/2020 22:29   CT Cervical Spine Wo Contrast  Result Date: 07/31/2020 CLINICAL DATA:  Recent fall from chair, initial encounter EXAM: CT HEAD WITHOUT CONTRAST CT CERVICAL SPINE WITHOUT CONTRAST TECHNIQUE: Multidetector CT imaging of the head and cervical spine was performed following the standard protocol without intravenous contrast. Multiplanar CT image reconstructions of the cervical spine were also generated. COMPARISON:  None. FINDINGS: CT HEAD FINDINGS Brain: No evidence of acute infarction, hemorrhage, hydrocephalus, extra-axial collection or mass lesion/mass effect. Calcification is noted in the midportion of the pons. Mild chronic white matter ischemic changes are noted. Vascular: No hyperdense vessel or unexpected calcification. Skull: Normal. Negative for fracture or focal lesion. Sinuses/Orbits: No acute finding. Other: Mild parietal scalp hematoma is noted in the vertex on the left. CT CERVICAL SPINE  FINDINGS Alignment: Mild loss of normal cervical lordosis is noted likely related to muscular spasm Skull base and vertebrae: 7 cervical segments are well visualized. Vertebral body height is well maintained. Multilevel osteophytic changes and facet hypertrophic changes are noted. No acute fracture or acute facet abnormality is seen. Soft tissues and spinal canal: Surrounding soft tissue structures show vascular calcifications. Enlargement of the right lobe of the thyroid is noted with evidence of calcifications and a nodule which  measures at least 2.5 cm. No other soft tissue abnormality is noted. Upper chest: Visualized lung apices demonstrate evidence of a right-sided pleural effusion. This is of uncertain chronicity. No rib abnormality is noted. Other: None IMPRESSION: CT of the head: Left parietal scalp hematoma. No acute intracranial abnormality is noted. CT of the cervical spine: Multilevel degenerative changes without acute bony abnormality. Right-sided pleural effusion. Prominent right lobe of the thyroid with at least 1 nodule within. Recommend nonemergent thyroid US (ref: J Am Coll Radiol. 2015 Feb;12(2): 143-50). Electronically Signed   By: Inez Catalina M.D.   On: 07/31/2020 18:10      Assessment/Plan Mechanical fall  T10 fracture with extension into T9-10 disc space in setting of diffuse bridging osteophytes - per neurosurgery, to the OR today. Spine precautions, bed rest, HOB flat  Abdominal pain, nausea/emesis, diarrhea - history of diverticulosis - pain/diarrhea present for greater than 1 week and not related to fall - CT abd/pelvis ordered - recommend NGT for aspiration risk if spine precautions continue post surgery  Left arm pain/swelling - some swelling likely related to chronic arthritis. Contusion to elbow. Will get forearm xray  FEN: NPO ID: none  Winferd Humphrey, [facility] Surgery 08/01/2020, 8:43 AM Please see Amion for pager number during day hours  7:00am-4:30pm

## 2020-08-01 NOTE — H&P (Signed)
History and Physical  Jacqueline Richmond JKK:[id number] DOB: 21-Nov-1945 DOA: 07/31/2020  Referring physician: Edwina Richmond from [place] [facility] by Dr. Myna Hidalgo, [facility]. PCP: Jacqueline Limbo, MD  Outpatient Specialists: GI Patient coming from: [place] ED through home.  Chief Complaint: Fall and severe mid back pain  HPI: Jacqueline Richmond is a 75 y.o. female with medical history significant for type 2 diabetes, essential hypertension, CKD 4, who presented to [facility] ED after a mechanical fall at home.  She reports that she was sitting in a rocking chair in her porch for 1 to 2 hours then she got up loss of balance and fell on the steps hitting her head.  She denies loss of consciousness.  She presented to the ED for further evaluation.  CT head nonacute.  CT chest without contrast showed extension injury of the thoracic spine with displaced T10 vertebral body fracture extending into the T9-T10 disc space and anterior vertebral body.  Fracture extends through anterior osteophytes as well as the anterior superior aspect of T11 vertebral body.  There is adjacent paravertebral hemorrhage and small volume of gas likely related to escape vacuum phenomenon.  Neurosurgery was consulted due to these findings.  Also showed on CT chest without contrast moderate to large right pleural effusion, heterogeneous density, may represent hemorrhage, collapsed lung or less likely masses, trauma surgery Dr. Kae Heller was consulted due to these findings.Marland Kitchen  Heterogeneously enlarged right lobe of the thyroid gland with scattered curvilinear calcifications.  Recommend thyroid ultrasound.  TRH, hospitalist team, was asked to admit.  Patient was accepted as a direct admit to [facility] telemetry medical unit.  ED Course:  Afebrile.  BP 134/75, pulse 80, respiratory 18, O2 saturation 99% on 2 L.  Lab studies were remarkable for serum bicarb 21, anion gap 10, BUN 61, creatinine 2.05, GFR 25.  WBC 13.5, hemoglobin 11.2, platelet 231.  Review of Systems: Review  of systems as noted in the HPI. All other systems reviewed and are negative.   Past Medical History:  Diagnosis Date  . Allergy   . Anemia   . Arthritis   . Cataract    biltaerally   . CKD (chronic kidney disease) stage 3, GFR 30-59 ml/min (HCC) 08/29/2011   per patient, kidneys monitored by Dr Posey Pronto endocrinologist ,lov was  05-18-2018, "kidney fx stable"  . Colon polyp   . Complication of anesthesia   . Difficult intubation    small mouth opening  . Diverticulosis   . Gout   . HTN (hypertension) 08/29/2011  . Hyperlipemia   . Hypertension   . Psoriasis   . Type II or unspecified type diabetes mellitus without mention of complication, not stated as uncontrolled 08/29/2011   Past Surgical History:  Procedure Laterality Date  . BREAST LUMPECTOMY WITH NEEDLE LOCALIZATION Right 05/07/2012   Procedure: BREAST LUMPECTOMY WITH NEEDLE LOCALIZATION;  Surgeon: Merrie Roof, MD;  Location: [facility];  Service: General;  Laterality: Right;  . CHOLECYSTECTOMY    . COLONOSCOPY  2009   Ardis Hughs   . COLONOSCOPY WITH PROPOFOL N/A 05/20/2018   Procedure: COLONOSCOPY WITH PROPOFOL;  Surgeon: Milus Banister, MD;  Location: [facility];  Service: Endoscopy;  Laterality: N/A;  . HERNIA REPAIR    . HIP SURGERY    . POLYPECTOMY  05/20/2018   Procedure: POLYPECTOMY;  Surgeon: Milus Banister, MD;  Location: [facility];  Service: Endoscopy;;  . RHINOPLASTY    . SALPINGOOPHORECTOMY    . TOTAL HIP ARTHROPLASTY Right 2004  Social History:  reports that she has never smoked. She has never used smokeless tobacco. She reports current alcohol use. She reports that she does not use drugs.   Allergies  Allergen Reactions  . Codeine Itching  . Iodinated Diagnostic Agents     Other reaction(s): Unknown  . Pentazocine Other (See Comments)    Hallucinations  . Statins     Family History  Problem Relation Age of Onset  . Heart disease Father   . Arthritis Sister   . Colon cancer Neg Hx   . Colon  polyps Neg Hx   . Esophageal cancer Neg Hx   . Stomach cancer Neg Hx   . Rectal cancer Neg Hx       Prior to Admission medications   Medication Sig Start Date End Date Taking? Authorizing Provider  allopurinol (ZYLOPRIM) 300 MG tablet Take 1 tablet (300 mg total) by mouth daily. 06/07/19  Yes Lyndal Pulley, DO  aspirin EC 81 MG tablet Take 81 mg by mouth daily. Reported on 06/19/2015   Yes [provider]  atenolol (TENORMIN) 100 MG tablet Take 100 mg by mouth daily.   Yes [provider]  b complex vitamins tablet Take 1 tablet by mouth daily.   Yes [provider]  CINNAMON PO Take 2,000 mg by mouth 2 (two) times daily. Reported on 06/19/2015   Yes [provider]  cloNIDine (CATAPRES) 0.3 MG tablet Take 0.3 mg by mouth 2 (two) times daily.   Yes [provider]  furosemide (LASIX) 40 MG tablet Take 40 mg by mouth daily.    Yes [provider]  Garlic 5462 MG CAPS Take 2,000 mg by mouth 2 (two) times daily.   Yes [provider]  glipiZIDE (GLUCOTROL XL) 10 MG 24 hr tablet Take 10 mg by mouth daily with breakfast.   Yes [provider]  loratadine (CLARITIN) 10 MG tablet Take 10 mg by mouth daily.   Yes [provider]  mometasone (ELOCON) 0.1 % cream Apply 1 application topically daily as needed (psoriasis).  10/12/13  Yes [provider]  niacin 500 MG tablet Take 500 mg by mouth 2 (two) times daily with a meal. Reported on 06/19/2015   Yes [provider]  Omega-3 1000 MG CAPS Take 1,000 mg by mouth 2 (two) times daily.   Yes [provider]  sitaGLIPtin (JANUVIA) 50 MG tablet Take 50 mg by mouth daily.    Yes [provider]  allopurinol (ZYLOPRIM) 300 MG tablet Take 300 mg by mouth daily.     [provider]  doxycycline (VIBRA-TABS) 100 MG tablet Take 1 tablet (100 mg total) by mouth 2 (two) times daily. 06/09/19   Lyndal Pulley, DO  fenofibrate 54 MG tablet  Take 1 tablet (54 mg total) by mouth at bedtime. 09/02/11 05/13/19  Thurnell Lose, MD  pravastatin (PRAVACHOL) 10 MG tablet Take 1 tablet by mouth daily. 10/28/18   [provider]    Physical Exam: BP 134/75 (BP Location: Left Arm)   Pulse 80   Temp (!) 97.5 F (36.4 C)   Resp 18   Ht 5\' 3"  (1.6 m)   Wt 84.4 kg   SpO2 99%   BMI 32.95 kg/m   . General: 75 y.o. year-old female well developed well nourished in no acute distress.  Alert and oriented x3. . Cardiovascular: Regular rate and rhythm with no rubs or gallops.  No thyromegaly or JVD noted.  No lower extremity edema. 2/4 pulses in all 4 extremities. Marland Kitchen Respiratory: Diminished Sounds on the right lower lobe.  Poor inspiratory effort. . Abdomen: Soft nontender nondistended with normal bowel sounds x4 quadrants. . Muskuloskeletal: No cyanosis or clubbing.  Edema involving upper extremities bilaterally. . Neuro: CN II-XII intact, strength, sensation, reflexes . Skin: No ulcerative lesions noted or rashes . Psychiatry: Judgement and insight appear normal. Mood is appropriate for condition and setting          Labs on Admission:  Basic Metabolic Panel: Recent Labs  Lab 07/31/20 1755  NA 135  K 4.5  CL 104  CO2 21*  GLUCOSE 113*  BUN 61*  CREATININE 2.05*  CALCIUM 9.4   Liver Function Tests: No results for input(s): AST, ALT, ALKPHOS, BILITOT, PROT, ALBUMIN in the last 168 hours. No results for input(s): LIPASE, AMYLASE in the last 168 hours. No results for input(s): AMMONIA in the last 168 hours. CBC: Recent Labs  Lab 07/31/20 1755  WBC 13.5*  HGB 11.2*  HCT 35.1*  MCV 86.9  PLT 231   Cardiac Enzymes: No results for input(s): CKTOTAL, CKMB, CKMBINDEX, TROPONINI in the last 168 hours.  BNP (last 3 results) No results for input(s): BNP in the last 8760 hours.  ProBNP (last 3 results) No results for input(s): PROBNP in the last 8760 hours.  CBG: No results for input(s): GLUCAP in the last 168  hours.  Radiological Exams on Admission: DG Ribs Bilateral W/Chest  Result Date: 07/31/2020 CLINICAL DATA:  Fall.  Pain. EXAM: BILATERAL RIBS AND CHEST - 4+ VIEW COMPARISON:  Remote chest radiograph 05/03/2012. No interval chest imaging. FINDINGS: No fracture or other bone lesions are seen involving the ribs. Right pleural effusion at least moderate in size. No pneumothorax. Heart size and mediastinal contours are within normal limits. Aortic atherosclerosis. IMPRESSION: 1. No evidence of rib fracture. 2. Right pleural effusion at least moderate in size. This is of unknown acuity. Electronically Signed   By: Keith Rake M.D.   On: 07/31/2020 20:39   DG Thoracic Spine 2 View  Result Date: 07/31/2020 CLINICAL DATA:  Unwitnessed fall.  Pain. EXAM: THORACIC SPINE 2 VIEWS COMPARISON:  None. FINDINGS: Broad-based dextroscoliotic curvature. No evidence of acute fracture or compression deformity. Flowing anterior osteophytes throughout the thoracic spine. Minimal disc space narrowing at T10-T11 and T11-T12. no paravertebral soft tissue abnormality IMPRESSION: 1. No radiographic evidence of acute fracture of the thoracic spine. 2. Scoliosis and degenerative change. Electronically Signed   By: Keith Rake M.D.   On: 07/31/2020 20:36   DG Lumbar Spine Complete  Result Date: 07/31/2020 CLINICAL DATA:  Unwitnessed fall.  Pain. EXAM: LUMBAR SPINE - COMPLETE 4+ VIEW COMPARISON:  None. FINDINGS: Five non-rib-bearing lumbar vertebra. Straightening of normal lordosis. Near complete disc space loss at L4-L5 with prominent disc space narrowing at L3-L4 and L5-S1. Endplate spurring with lesser disc space narrowing at L1-L2 and L2-L3. Multilevel facet hypertrophy. Vertebral body heights are preserved. No evidence of fracture. Degenerative change of both sacroiliac joints. IMPRESSION: 1. No fracture of the lumbar spine. 2. Straightening of normal lordosis may be positioning or muscle spasm. Multilevel degenerative  disc disease and facet hypertrophy. Electronically Signed   By: Keith Rake M.D.   On: 07/31/2020 20:37   CT Head Wo Contrast  Result Date: 07/31/2020 CLINICAL DATA:  Recent fall from chair, initial encounter EXAM: CT HEAD WITHOUT CONTRAST CT CERVICAL SPINE WITHOUT CONTRAST TECHNIQUE: Multidetector CT imaging of the head and cervical spine  was performed following the standard protocol without intravenous contrast. Multiplanar CT image reconstructions of the cervical spine were also generated. COMPARISON:  None. FINDINGS: CT HEAD FINDINGS Brain: No evidence of acute infarction, hemorrhage, hydrocephalus, extra-axial collection or mass lesion/mass effect. Calcification is noted in the midportion of the pons. Mild chronic white matter ischemic changes are noted. Vascular: No hyperdense vessel or unexpected calcification. Skull: Normal. Negative for fracture or focal lesion. Sinuses/Orbits: No acute finding. Other: Mild parietal scalp hematoma is noted in the vertex on the left. CT CERVICAL SPINE FINDINGS Alignment: Mild loss of normal cervical lordosis is noted likely related to muscular spasm Skull base and vertebrae: 7 cervical segments are well visualized. Vertebral body height is well maintained. Multilevel osteophytic changes and facet hypertrophic changes are noted. No acute fracture or acute facet abnormality is seen. Soft tissues and spinal canal: Surrounding soft tissue structures show vascular calcifications. Enlargement of the right lobe of the thyroid is noted with evidence of calcifications and a nodule which measures at least 2.5 cm. No other soft tissue abnormality is noted. Upper chest: Visualized lung apices demonstrate evidence of a right-sided pleural effusion. This is of uncertain chronicity. No rib abnormality is noted. Other: None IMPRESSION: CT of the head: Left parietal scalp hematoma. No acute intracranial abnormality is noted. CT of the cervical spine: Multilevel degenerative changes  without acute bony abnormality. Right-sided pleural effusion. Prominent right lobe of the thyroid with at least 1 nodule within. Recommend nonemergent thyroid US (ref: J Am Coll Radiol. 2015 Feb;12(2): 143-50). Electronically Signed   By: Inez Catalina M.D.   On: 07/31/2020 18:10   CT Chest Wo Contrast  Result Date: 07/31/2020 CLINICAL DATA:  Fall.  Chest trauma. EXAM: CT CHEST WITHOUT CONTRAST TECHNIQUE: Multidetector CT imaging of the chest was performed following the standard protocol without IV contrast. COMPARISON:  Ribbon thoracic spine radiographs earlier today. FINDINGS: Cardiovascular: Moderate aortic atherosclerosis. No periaortic stranding to suggest injury. There are coronary artery calcifications. Heart is normal in size, slightly displaced into the left chest. No pericardial effusion. Mediastinum/Nodes: Scattered small mediastinal lymph nodes, not enlarged by size criteria. No esophageal wall thickening. Heterogeneously enlarged right lobe of the thyroid gland with scattered curvilinear calcifications. Included left thyroid lobe is normal. Lungs/Pleura: Moderate to large right pleural effusion, heterogeneous density. Areas of increased density within the pleural fluid may represent hemorrhage, collapsed lung or less likely masses. Small amount of pleural fluid appears loculated and tracks anteromedially. No pneumothorax. Left lung is clear. No left pleural effusion. Upper Abdomen: No definite free fluid in the upper abdomen. Diverticulosis at the splenic flexure of the colon. Cholecystectomy. Musculoskeletal: Bones are diffusely under mineralized. Diffuse thoracic ankylosis. Displaced fracture through T10 vertebral body involves the anterior third, extend into the T9-T10 disc space, and is displaced anteriorly. Fracture extends through anterior osteophytes as well as the anterior superior aspect of T11 vertebral body. No convincing extension to involve the posterior elements. There is adjacent  paravertebral hemorrhage and small volume of gas, likely related to escaped vacuum phenomenon. No additional thoracic spine fracture. Diffuse ankylosis with flowing anterior osteophytes. No acute fracture of the ribs, included clavicles or shoulder girdles. No sternal fracture. IMPRESSION: 1. Extension injury of the thoracic spine with displaced T10 vertebral body fracture extending into the T9-T10 disc space and anterior vertebral body. Fracture extends through anterior osteophytes as well as the anterior superior aspect of T11 vertebral body. No convincing extension to involve the posterior elements. There is adjacent paravertebral hemorrhage and  small volume of gas, likely related to escaped vacuum phenomenon. Recommend spine consultation. 2. Moderate to large right pleural effusion, heterogeneous density. Areas of increased density within the pleural fluid may represent hemorrhage, collapsed lung or less likely masses. Recommend continued radiographic follow-up to ensure resolution. No pneumothorax or rib fractures. 3. Heterogeneously enlarged right lobe of the thyroid gland with scattered curvilinear calcifications. Recommend thyroid US (ref: J Am Coll Radiol. 2015 Feb;12(2): 143-50). Aortic Atherosclerosis (ICD10-I70.0). Electronically Signed   By: Keith Rake M.D.   On: 07/31/2020 22:29   CT Cervical Spine Wo Contrast  Result Date: 07/31/2020 CLINICAL DATA:  Recent fall from chair, initial encounter EXAM: CT HEAD WITHOUT CONTRAST CT CERVICAL SPINE WITHOUT CONTRAST TECHNIQUE: Multidetector CT imaging of the head and cervical spine was performed following the standard protocol without intravenous contrast. Multiplanar CT image reconstructions of the cervical spine were also generated. COMPARISON:  None. FINDINGS: CT HEAD FINDINGS Brain: No evidence of acute infarction, hemorrhage, hydrocephalus, extra-axial collection or mass lesion/mass effect. Calcification is noted in the midportion of the pons.  Mild chronic white matter ischemic changes are noted. Vascular: No hyperdense vessel or unexpected calcification. Skull: Normal. Negative for fracture or focal lesion. Sinuses/Orbits: No acute finding. Other: Mild parietal scalp hematoma is noted in the vertex on the left. CT CERVICAL SPINE FINDINGS Alignment: Mild loss of normal cervical lordosis is noted likely related to muscular spasm Skull base and vertebrae: 7 cervical segments are well visualized. Vertebral body height is well maintained. Multilevel osteophytic changes and facet hypertrophic changes are noted. No acute fracture or acute facet abnormality is seen. Soft tissues and spinal canal: Surrounding soft tissue structures show vascular calcifications. Enlargement of the right lobe of the thyroid is noted with evidence of calcifications and a nodule which measures at least 2.5 cm. No other soft tissue abnormality is noted. Upper chest: Visualized lung apices demonstrate evidence of a right-sided pleural effusion. This is of uncertain chronicity. No rib abnormality is noted. Other: None IMPRESSION: CT of the head: Left parietal scalp hematoma. No acute intracranial abnormality is noted. CT of the cervical spine: Multilevel degenerative changes without acute bony abnormality. Right-sided pleural effusion. Prominent right lobe of the thyroid with at least 1 nodule within. Recommend nonemergent thyroid US (ref: J Am Coll Radiol. 2015 Feb;12(2): 143-50). Electronically Signed   By: Inez Catalina M.D.   On: 07/31/2020 18:10    EKG: I independently viewed the EKG done and my findings are as followed: None available at time of this visit.  Assessment/Plan Present on Admission: . T10 vertebral fracture (HCC)  Active Problems:   T10 vertebral fracture (HCC)  Displaced T10 vertebral fracture post mechanical fall at home CT chest without contrast showed extension injury of the thoracic spine with displaced T10 vertebral body fracture extending into the  T9-T10 disc space and anterior vertebral body.  Fracture extends through anterior osteophytes as well as the anterior superior aspect of T11 vertebral body.  There is adjacent paravertebral hemorrhage and small volume of gas likely related to escape vacuum phenomenon.   Neurosurgery was consulted due to the above findings.  Pain control  Large right pleural effusion, unclear etiology CT chest without contrast moderate to large right pleural effusion, heterogeneous density, may represent hemorrhage, collapsed lung or less likely masses.  Trauma surgery Dr. Kae Heller was consulted. Incentive spirometer Maintain O2 saturation greater than 92%.  CKD 4 Appears to be at her baseline creatinine 2.05 with GFR 25 Monitor urine output Avoid nephrotoxic agent Repeat  renal panel in the morning.  Leukocytosis, likely reactive in the setting of fracture Rule out active infective process Obtain UA Follow-up body fluid gram stain, analysis  Essential hypertension BP stable Resume home clonidine Monitor vital signs.  Heterogeneously enlarged right lobe of the thyroid gland with scattered curvilinear calcifications.   Obtain TSH Recommend thyroid ultrasound.    DVT prophylaxis: SCDs; start pharmacological DVT prophylaxis when okay with neurosurgery.  Code Status: Full code.  Family Communication: None at bedside.  Disposition Plan: Admit to telemetry medical.  Consults called: Neurosurgery and trauma team consulted by EDP.  Admission status: Inpatient status.  Patient will require at least 2 midnights for further evaluation and treatment of her present condition.   Status is: Inpatient    Dispo:  Patient From: Home  Planned Disposition: Home when neurosurgery and trauma team sign off.  Medically stable for discharge: No         Kayleen Memos MD [facility] Pager [phone]  If 7PM-7AM, please contact night-coverage [email or web address] Password [facility]  08/01/2020, 3:44 AM

## 2020-08-01 NOTE — Progress Notes (Signed)
Patient to Ultrasound

## 2020-08-01 NOTE — Progress Notes (Signed)
Patient finished two bottles of contrast.

## 2020-08-01 NOTE — Progress Notes (Signed)
  NEUROSURGERY PROGRESS NOTE   Patient now s/p T8-11 stabilization for unstable T10 fracture. I have ordered a TLSO brace which is to be worn when upright and OOB. Okay to start mobilizing once brace arrives.

## 2020-08-01 NOTE — Progress Notes (Signed)
Back to unit from Ultrasound.

## 2020-08-01 NOTE — Progress Notes (Addendum)
PROGRESS NOTE    Jacqueline Richmond  ZOX:[id number] DOB: 30-Jan-1946 DOA: 07/31/2020 PCP: Bernerd Limbo, MD   Brief Narrative:  HPI: Jacqueline Richmond is a 75 y.o. female with medical history significant for type 2 diabetes, essential hypertension, CKD 4, who presented to droppage ED after a mechanical fall at home.  She reports that she was sitting in a rocking chair in her porch for 1 to 2 hours then she got up loss of balance and fell on the steps hitting her head.  She denies loss of consciousness.  She presented to the ED for further evaluation.  CT head nonacute.  CT chest without contrast showed extension injury of the thoracic spine with displaced T10 vertebral body fracture extending into the T9-T10 disc space and anterior vertebral body.  Fracture extends through anterior osteophytes as well as the anterior superior aspect of T11 vertebral body.  There is adjacent paravertebral hemorrhage and small volume of gas likely related to escape vacuum phenomenon.  Neurosurgery was consulted due to these findings.  Also showed on CT chest without contrast moderate to large right pleural effusion, heterogeneous density, may represent hemorrhage, collapsed lung or less likely masses, trauma surgery Dr. Kae Heller was consulted due to these findings.Marland Kitchen  Heterogeneously enlarged right lobe of the thyroid gland with scattered curvilinear calcifications.  Recommend thyroid ultrasound.  TRH, hospitalist team, was asked to admit.  Patient was accepted as a direct admit to [facility] telemetry medical unit.  ED Course:  Afebrile.  BP 134/75, pulse 80, respiratory 18, O2 saturation 99% on 2 L.  Lab studies were remarkable for serum bicarb 21, anion gap 10, BUN 61, creatinine 2.05, GFR 25.  WBC 13.5, hemoglobin 11.2, platelet 231.  Assessment & Plan:   Active Problems:   T10 vertebral fracture (HCC)  Displaced T10 vertebral fracture post mechanical fall at home: Neurosurgery on board and now she is status post T10-11  stabilization for unstable T10 fracture.  TLSO brace has been ordered by neurosurgery.  We will mobilize once it arrives.  Appreciate neurosurgery help and defer management to them.  Large right pleural effusion, unclear etiology CT chest without contrast moderate to large right pleural effusion, heterogeneous density, may represent hemorrhage, collapsed lung or less likely masses.  Trauma surgery on board.  She denies any shortness of breath.  Will defer management to them.  Encouraged incentive spirometry.  AKI on CKD stage IIIa: Her baseline creatinine seems to be around 1.4.  Currently 2.68.  Secondary to all the trauma that she had.  Continue gentle hydration.  Avoid nephrotoxic agents.  Repeat labs in the morning.  Hyperkalemia: Will order Kayexalate.  Leukocytosis, likely reactive in the setting of fracture, Improved.  Monitor.  Essential hypertension: Blood pressure stable.  Will resume only beta-blocker to prevent tachycardia but hold clonidine.  Heterogeneously enlarged right lobe of the thyroid gland with scattered curvilinear calcifications.  TSH within normal range.  Will need ultrasound at some point in time.  Type 2 diabetes mellitus: Hold oral hypoglycemic agents.  Start on SSI.  Nausea and vomiting: She had complaint of nausea and vomiting preoperatively.  I saw her postoperatively and she has no complaints.  Trauma surgery has ordered CT abdomen and pelvis.  NG tube was placed as well.  Hyperkalemia: We will order Kayexalate.  DVT prophylaxis:    Code Status: Full Code  Family Communication: None present at bedside.  Plan of care discussed with patient in length and he verbalized understanding and agreed with it.  Status is: Inpatient  Remains inpatient appropriate because:Inpatient level of care appropriate due to severity of illness   Dispo:  Patient From: Home  Planned Disposition: Home  Medically stable for discharge: No          Estimated body mass  index is 32.95 kg/m as calculated from the following:   Height as of this encounter: 5\' 3"  (1.6 m).   Weight as of this encounter: 84.4 kg.      Nutritional status:               Consultants:   Trauma surgery  Neurosurgery  Procedures:   As above  Antimicrobials:  Anti-infectives (From admission, onward)   None         Subjective: Seen and examined in PACU after the surgery.  She was fully alert and oriented.  Denied any complaint or any shortness of breath.  Objective: Vitals:   08/01/20 1525 08/01/20 1540 08/01/20 1555 08/01/20 1610  BP: 139/65 (!) 132/42 140/67 123/68  Pulse: 87 86 84 88  Resp: 20 20 13 19   Temp:    (!) 97 F (36.1 C)  TempSrc:      SpO2: 99% 100% 97% 92%  Weight:      Height:        Intake/Output Summary (Last 24 hours) at 08/01/2020 1613 Last data filed at 08/01/2020 1611 Gross per 24 hour  Intake 1875 ml  Output 400 ml  Net 1475 ml   Filed Weights   07/31/20 1708  Weight: 84.4 kg    Examination:  General exam: Appears calm and comfortable, obese Respiratory system: Clear to auscultation.  Poor inspiratory effort due to pain. Cardiovascular system: S1 & S2 heard, RRR. No JVD, murmurs, rubs, gallops or clicks. No pedal edema. Gastrointestinal system: Abdomen is nondistended, soft and nontender. No organomegaly or masses felt. Normal bowel sounds heard. Central nervous system: Alert and oriented. No focal neurological deficits. Extremities: Symmetric 5 x 5 power. Skin: No rashes, lesions or ulcers Psychiatry: Judgement and insight appear normal. Mood & affect appropriate.    Data Reviewed: I have personally reviewed following labs and imaging studies  CBC: Recent Labs  Lab 07/31/20 1755 08/01/20 0559 08/01/20 1549  WBC 13.5* 18.5* 15.1*  HGB 11.2* 8.6* 10.4*  HCT 35.1* 27.4* 31.4*  MCV 86.9 89.8 86.5  PLT 231 214 295   Basic Metabolic Panel: Recent Labs  Lab 07/31/20 1755 08/01/20 0559  NA 135 137  K  4.5 5.6*  CL 104 107  CO2 21* 22  GLUCOSE 113* 194*  BUN 61* 68*  CREATININE 2.05* 2.68*  CALCIUM 9.4 8.7*  MG  --  1.9  PHOS  --  6.4*   GFR: Estimated Creatinine Clearance: 19.2 mL/min (A) (by C-G formula based on SCr of 2.68 mg/dL (H)). Liver Function Tests: Recent Labs  Lab 08/01/20 0559  AST 21  ALT 15  ALKPHOS 86  BILITOT 0.7  PROT 6.4*  ALBUMIN 2.7*   No results for input(s): LIPASE, AMYLASE in the last 168 hours. No results for input(s): AMMONIA in the last 168 hours. Coagulation Profile: No results for input(s): INR, PROTIME in the last 168 hours. Cardiac Enzymes: No results for input(s): CKTOTAL, CKMB, CKMBINDEX, TROPONINI in the last 168 hours. BNP (last 3 results) No results for input(s): PROBNP in the last 8760 hours. HbA1C: Recent Labs    08/01/20 0602  HGBA1C 5.4   CBG: Recent Labs  Lab 08/01/20 0819  GLUCAP 158*   Lipid  Profile: No results for input(s): CHOL, HDL, LDLCALC, TRIG, CHOLHDL, LDLDIRECT in the last 72 hours. Thyroid Function Tests: Recent Labs    08/01/20 0657  TSH 1.188   Anemia Panel: No results for input(s): VITAMINB12, FOLATE, FERRITIN, TIBC, IRON, RETICCTPCT in the last 72 hours. Sepsis Labs: Recent Labs  Lab 08/01/20 0018  LATICACIDVEN 1.7    Recent Results (from the past 240 hour(s))  Resp Panel by RT-PCR (Flu A&B, Covid) Nasopharyngeal Swab     Status: None   Collection Time: 07/31/20 11:08 PM   Specimen: Nasopharyngeal Swab; Nasopharyngeal(NP) swabs in vial transport medium  Result Value Ref Range Status   SARS Coronavirus 2 by RT PCR NEGATIVE NEGATIVE Final    Comment: (NOTE) SARS-CoV-2 target nucleic acids are NOT DETECTED.  The SARS-CoV-2 RNA is generally detectable in upper respiratory specimens during the acute phase of infection. The lowest concentration of SARS-CoV-2 viral copies this assay can detect is 138 copies/mL. A negative result does not preclude SARS-Cov-2 infection and should not be used as  the sole basis for treatment or other patient management decisions. A negative result may occur with  improper specimen collection/handling, submission of specimen other than nasopharyngeal swab, presence of viral mutation(s) within the areas targeted by this assay, and inadequate number of viral copies(<138 copies/mL). A negative result must be combined with clinical observations, patient history, and epidemiological information. The expected result is Negative.  Fact Sheet for Patients:  EntrepreneurPulse.com.au  Fact Sheet for Healthcare Providers:  IncredibleEmployment.be  This test is no t yet approved or cleared by the [place] FDA and  has been authorized for detection and/or diagnosis of SARS-CoV-2 by FDA under an Emergency Use Authorization (EUA). This EUA will remain  in effect (meaning this test can be used) for the duration of the COVID-19 declaration under Section 564(b)(1) of the Act, 21 U.S.C.section 360bbb-3(b)(1), unless the authorization is terminated  or revoked sooner.       Influenza A by PCR NEGATIVE NEGATIVE Final   Influenza B by PCR NEGATIVE NEGATIVE Final    Comment: (NOTE) The Xpert Xpress SARS-CoV-2/FLU/RSV plus assay is intended as an aid in the diagnosis of influenza from Nasopharyngeal swab specimens and should not be used as a sole basis for treatment. Nasal washings and aspirates are unacceptable for Xpert Xpress SARS-CoV-2/FLU/RSV testing.  Fact Sheet for Patients: EntrepreneurPulse.com.au  Fact Sheet for Healthcare Providers: IncredibleEmployment.be  This test is not yet approved or cleared by the [place] FDA and has been authorized for detection and/or diagnosis of SARS-CoV-2 by FDA under an Emergency Use Authorization (EUA). This EUA will remain in effect (meaning this test can be used) for the duration of the COVID-19 declaration under Section 564(b)(1) of  the Act, 21 U.S.C. section 360bbb-3(b)(1), unless the authorization is terminated or revoked.  Performed at [facility], [address], [place], [place] [address]   MRSA PCR Screening     Status: None   Collection Time: 08/01/20  2:59 AM   Specimen: Nasal Mucosa; Nasopharyngeal  Result Value Ref Range Status   MRSA by PCR NEGATIVE NEGATIVE Final    Comment:        The GeneXpert MRSA Assay (FDA approved for NASAL specimens only), is one component of a comprehensive MRSA colonization surveillance program. It is not intended to diagnose MRSA infection nor to guide or monitor treatment for MRSA infections. Performed at [facility], [place] [address] [place], [place] [address]       Radiology  Studies: DG Ribs Bilateral W/Chest  Result Date: 07/31/2020 CLINICAL DATA:  Fall.  Pain. EXAM: BILATERAL RIBS AND CHEST - 4+ VIEW COMPARISON:  Remote chest radiograph 05/03/2012. No interval chest imaging. FINDINGS: No fracture or other bone lesions are seen involving the ribs. Right pleural effusion at least moderate in size. No pneumothorax. Heart size and mediastinal contours are within normal limits. Aortic atherosclerosis. IMPRESSION: 1. No evidence of rib fracture. 2. Right pleural effusion at least moderate in size. This is of unknown acuity. Electronically Signed   By: Keith Rake M.D.   On: 07/31/2020 20:39   DG Thoracic Spine 2 View  Result Date: 07/31/2020 CLINICAL DATA:  Unwitnessed fall.  Pain. EXAM: THORACIC SPINE 2 VIEWS COMPARISON:  None. FINDINGS: Broad-based dextroscoliotic curvature. No evidence of acute fracture or compression deformity. Flowing anterior osteophytes throughout the thoracic spine. Minimal disc space narrowing at T10-T11 and T11-T12. no paravertebral soft tissue abnormality IMPRESSION: 1. No radiographic evidence of acute fracture of the thoracic spine. 2. Scoliosis and degenerative change. Electronically Signed   By: Keith Rake M.D.   On: 07/31/2020 20:36   DG Lumbar Spine Complete  Result Date: 07/31/2020 CLINICAL DATA:  Unwitnessed fall.  Pain. EXAM: LUMBAR SPINE - COMPLETE 4+ VIEW COMPARISON:  None. FINDINGS: Five non-rib-bearing lumbar vertebra. Straightening of normal lordosis. Near complete disc space loss at L4-L5 with prominent disc space narrowing at L3-L4 and L5-S1. Endplate spurring with lesser disc space narrowing at L1-L2 and L2-L3. Multilevel facet hypertrophy. Vertebral body heights are preserved. No evidence of fracture. Degenerative change of both sacroiliac joints. IMPRESSION: 1. No fracture of the lumbar spine. 2. Straightening of normal lordosis may be positioning or muscle spasm. Multilevel degenerative disc disease and facet hypertrophy. Electronically Signed   By: Keith Rake M.D.   On: 07/31/2020 20:37   DG Forearm Left  Result Date: 08/01/2020 CLINICAL DATA:  Fall yesterday with swelling. EXAM: LEFT FOREARM - 2 VIEW COMPARISON:  None. FINDINGS: Advanced degenerative changes involving the carpal bones and radiocarpal articulation. There is also more mild to moderate degenerative change about the elbow. No acute fracture or dislocation. No elbow joint effusion. Suboptimal patient positioning secondary to clinical status. Possible soft tissue swelling adjacent the proximal ulna. IMPRESSION: Advanced degenerative change, without acute osseous abnormality. Electronically Signed   By: Abigail Miyamoto M.D.   On: 08/01/2020 13:48   CT Head Wo Contrast  Result Date: 07/31/2020 CLINICAL DATA:  Recent fall from chair, initial encounter EXAM: CT HEAD WITHOUT CONTRAST CT CERVICAL SPINE WITHOUT CONTRAST TECHNIQUE: Multidetector CT imaging of the head and cervical spine was performed following the standard protocol without intravenous contrast. Multiplanar CT image reconstructions of the cervical spine were also generated. COMPARISON:  None. FINDINGS: CT HEAD FINDINGS Brain: No evidence of acute infarction,  hemorrhage, hydrocephalus, extra-axial collection or mass lesion/mass effect. Calcification is noted in the midportion of the pons. Mild chronic white matter ischemic changes are noted. Vascular: No hyperdense vessel or unexpected calcification. Skull: Normal. Negative for fracture or focal lesion. Sinuses/Orbits: No acute finding. Other: Mild parietal scalp hematoma is noted in the vertex on the left. CT CERVICAL SPINE FINDINGS Alignment: Mild loss of normal cervical lordosis is noted likely related to muscular spasm Skull base and vertebrae: 7 cervical segments are well visualized. Vertebral body height is well maintained. Multilevel osteophytic changes and facet hypertrophic changes are noted. No acute fracture or acute facet abnormality is seen. Soft tissues and spinal canal: Surrounding soft tissue structures show vascular calcifications.  Enlargement of the right lobe of the thyroid is noted with evidence of calcifications and a nodule which measures at least 2.5 cm. No other soft tissue abnormality is noted. Upper chest: Visualized lung apices demonstrate evidence of a right-sided pleural effusion. This is of uncertain chronicity. No rib abnormality is noted. Other: None IMPRESSION: CT of the head: Left parietal scalp hematoma. No acute intracranial abnormality is noted. CT of the cervical spine: Multilevel degenerative changes without acute bony abnormality. Right-sided pleural effusion. Prominent right lobe of the thyroid with at least 1 nodule within. Recommend nonemergent thyroid US (ref: J Am Coll Radiol. 2015 Feb;12(2): 143-50). Electronically Signed   By: Inez Catalina M.D.   On: 07/31/2020 18:10   CT Chest Wo Contrast  Result Date: 07/31/2020 CLINICAL DATA:  Fall.  Chest trauma. EXAM: CT CHEST WITHOUT CONTRAST TECHNIQUE: Multidetector CT imaging of the chest was performed following the standard protocol without IV contrast. COMPARISON:  Ribbon thoracic spine radiographs earlier today. FINDINGS:  Cardiovascular: Moderate aortic atherosclerosis. No periaortic stranding to suggest injury. There are coronary artery calcifications. Heart is normal in size, slightly displaced into the left chest. No pericardial effusion. Mediastinum/Nodes: Scattered small mediastinal lymph nodes, not enlarged by size criteria. No esophageal wall thickening. Heterogeneously enlarged right lobe of the thyroid gland with scattered curvilinear calcifications. Included left thyroid lobe is normal. Lungs/Pleura: Moderate to large right pleural effusion, heterogeneous density. Areas of increased density within the pleural fluid may represent hemorrhage, collapsed lung or less likely masses. Small amount of pleural fluid appears loculated and tracks anteromedially. No pneumothorax. Left lung is clear. No left pleural effusion. Upper Abdomen: No definite free fluid in the upper abdomen. Diverticulosis at the splenic flexure of the colon. Cholecystectomy. Musculoskeletal: Bones are diffusely under mineralized. Diffuse thoracic ankylosis. Displaced fracture through T10 vertebral body involves the anterior third, extend into the T9-T10 disc space, and is displaced anteriorly. Fracture extends through anterior osteophytes as well as the anterior superior aspect of T11 vertebral body. No convincing extension to involve the posterior elements. There is adjacent paravertebral hemorrhage and small volume of gas, likely related to escaped vacuum phenomenon. No additional thoracic spine fracture. Diffuse ankylosis with flowing anterior osteophytes. No acute fracture of the ribs, included clavicles or shoulder girdles. No sternal fracture. IMPRESSION: 1. Extension injury of the thoracic spine with displaced T10 vertebral body fracture extending into the T9-T10 disc space and anterior vertebral body. Fracture extends through anterior osteophytes as well as the anterior superior aspect of T11 vertebral body. No convincing extension to involve the  posterior elements. There is adjacent paravertebral hemorrhage and small volume of gas, likely related to escaped vacuum phenomenon. Recommend spine consultation. 2. Moderate to large right pleural effusion, heterogeneous density. Areas of increased density within the pleural fluid may represent hemorrhage, collapsed lung or less likely masses. Recommend continued radiographic follow-up to ensure resolution. No pneumothorax or rib fractures. 3. Heterogeneously enlarged right lobe of the thyroid gland with scattered curvilinear calcifications. Recommend thyroid US (ref: J Am Coll Radiol. 2015 Feb;12(2): 143-50). Aortic Atherosclerosis (ICD10-I70.0). Electronically Signed   By: Keith Rake M.D.   On: 07/31/2020 22:29   CT Cervical Spine Wo Contrast  Result Date: 07/31/2020 CLINICAL DATA:  Recent fall from chair, initial encounter EXAM: CT HEAD WITHOUT CONTRAST CT CERVICAL SPINE WITHOUT CONTRAST TECHNIQUE: Multidetector CT imaging of the head and cervical spine was performed following the standard protocol without intravenous contrast. Multiplanar CT image reconstructions of the cervical spine were also generated. COMPARISON:  None. FINDINGS: CT HEAD FINDINGS Brain: No evidence of acute infarction, hemorrhage, hydrocephalus, extra-axial collection or mass lesion/mass effect. Calcification is noted in the midportion of the pons. Mild chronic white matter ischemic changes are noted. Vascular: No hyperdense vessel or unexpected calcification. Skull: Normal. Negative for fracture or focal lesion. Sinuses/Orbits: No acute finding. Other: Mild parietal scalp hematoma is noted in the vertex on the left. CT CERVICAL SPINE FINDINGS Alignment: Mild loss of normal cervical lordosis is noted likely related to muscular spasm Skull base and vertebrae: 7 cervical segments are well visualized. Vertebral body height is well maintained. Multilevel osteophytic changes and facet hypertrophic changes are noted. No acute fracture  or acute facet abnormality is seen. Soft tissues and spinal canal: Surrounding soft tissue structures show vascular calcifications. Enlargement of the right lobe of the thyroid is noted with evidence of calcifications and a nodule which measures at least 2.5 cm. No other soft tissue abnormality is noted. Upper chest: Visualized lung apices demonstrate evidence of a right-sided pleural effusion. This is of uncertain chronicity. No rib abnormality is noted. Other: None IMPRESSION: CT of the head: Left parietal scalp hematoma. No acute intracranial abnormality is noted. CT of the cervical spine: Multilevel degenerative changes without acute bony abnormality. Right-sided pleural effusion. Prominent right lobe of the thyroid with at least 1 nodule within. Recommend nonemergent thyroid US (ref: J Am Coll Radiol. 2015 Feb;12(2): 143-50). Electronically Signed   By: Inez Catalina M.D.   On: 07/31/2020 18:10    Scheduled Meds: . sodium chloride   Intravenous Once  . sodium chloride   Intravenous Once  . Barium Sulfate      . [MAR Hold] cloNIDine  0.2 mg Oral BID  . [MAR Hold] insulin aspart  0-6 Units Subcutaneous TID WC  . [MAR Hold] senna-docusate  2 tablet Oral QHS   Continuous Infusions: . sodium chloride 1,000 mL (08/01/20 0656)  . lactated ringers 10 mL/hr at 08/01/20 1047     LOS: 0 days   Time spent: 38 minutes   Darliss Cheney, MD [facility]  08/01/2020, 4:13 PM   How to contact the [facility] Attending or Consulting provider [place] or covering provider during after hours [place], for this patient?  1. Check the care team in [facility] and look for a) attending/consulting TRH provider listed and b) the [facility] team listed. Page or secure chat [phone]. 2. Log into [email or web address] and use [place]'s universal password to access. If you do not have the password, please contact the hospital operator. 3. Locate the [facility] provider you are looking for under [facility] and page to a number that you can  be directly reached. 4. If you still have difficulty reaching the provider, please page the [facility] (Director on Call) for the Hospitalists listed on amion for assistance.

## 2020-08-01 NOTE — Plan of Care (Signed)

## 2020-08-01 NOTE — Anesthesia Procedure Notes (Signed)
Procedure Name: Intubation Date/Time: 08/01/2020 11:50 AM Performed by: Reece Agar, CRNA Pre-anesthesia Checklist: Patient identified, Emergency Drugs available, Suction available and Patient being monitored Patient Re-evaluated:Patient Re-evaluated prior to induction Oxygen Delivery Method: Circle System Utilized Preoxygenation: Pre-oxygenation with 100% oxygen Induction Type: IV induction Ventilation: Mask ventilation without difficulty Laryngoscope Size: Glidescope and 4 Grade View: Grade I Tube type: Oral Tube size: 7.0 mm Number of attempts: 1 Airway Equipment and Method: Stylet and Oral airway Placement Confirmation: ETT inserted through vocal cords under direct vision,  positive ETCO2 and breath sounds checked- equal and bilateral Secured at: 21 cm Tube secured with: Tape Dental Injury: Teeth and Oropharynx as per pre-operative assessment

## 2020-08-01 NOTE — ED Notes (Signed)
Pt has  Been transferred to [place] - [address] via [facility] - pt is sable

## 2020-08-01 NOTE — Transfer of Care (Signed)
Immediate Anesthesia Transfer of Care Note  Patient: Jacqueline Richmond  Procedure(s) Performed: LAMINECTOMY WITH POSTERIOR LATERAL ARTHRODESIS Thoracic nine- Thoracic eleven  (N/A )  Patient Location: PACU  Anesthesia Type:General  Level of Consciousness: drowsy  Airway & Oxygen Therapy: Patient Spontanous Breathing and Patient connected to face mask oxygen  Post-op Assessment: Report given to RN and Post -op Vital signs reviewed and stable  Post vital signs: Reviewed and stable  Last Vitals:  Vitals Value Taken Time  BP 129/58 08/01/20 1440  Temp 36.4 C 08/01/20 1440  Pulse 91 08/01/20 1445  Resp 21 08/01/20 1445  SpO2 89 % 08/01/20 1445  Vitals shown include unvalidated device data.  Last Pain:  Vitals:   08/01/20 1039  TempSrc:   PainSc: 5       Patients Stated Pain Goal: 2 (03/25/70 5366)  Complications: No complications documented.

## 2020-08-01 NOTE — Progress Notes (Signed)
Patient to CT.

## 2020-08-01 NOTE — Anesthesia Preprocedure Evaluation (Addendum)
Anesthesia Evaluation  Patient identified by MRN, date of birth, ID band Patient awake    Reviewed: Allergy & Precautions, H&P , NPO status , Patient's Chart, lab work & pertinent test results  History of Anesthesia Complications (+) DIFFICULT AIRWAY and history of anesthetic complications  Airway Mallampati: III   Neck ROM: full  Mouth opening: Limited Mouth Opening  Dental   Pulmonary  Right side pleural effusion (hemothorax?)   breath sounds clear to auscultation       Cardiovascular hypertension,  Rhythm:regular Rate:Normal     Neuro/Psych T10 fx s/p fall at home    GI/Hepatic   Endo/Other  diabetes, Type 2  Renal/GU Renal InsufficiencyRenal disease     Musculoskeletal  (+) Arthritis ,   Abdominal   Peds  Hematology   Anesthesia Other Findings   Reproductive/Obstetrics                            Anesthesia Physical Anesthesia Plan  ASA: III  Anesthesia Plan: General   Post-op Pain Management:    Induction: Intravenous  PONV Risk Score and Plan: 3 and Ondansetron, Dexamethasone and Treatment may vary due to age or medical condition  Airway Management Planned: Video Laryngoscope Planned and Oral ETT  Additional Equipment:   Intra-op Plan:   Post-operative Plan: Extubation in OR  Informed Consent: I have reviewed the patients History and Physical, chart, labs and discussed the procedure including the risks, benefits and alternatives for the proposed anesthesia with the patient or authorized representative who has indicated his/her understanding and acceptance.     Dental advisory given  Plan Discussed with: CRNA, Anesthesiologist and Surgeon  Anesthesia Plan Comments:         Anesthesia Quick Evaluation

## 2020-08-02 ENCOUNTER — Inpatient Hospital Stay (HOSPITAL_COMMUNITY): Payer: Medicare PPO | Admitting: Certified Registered Nurse Anesthetist

## 2020-08-02 ENCOUNTER — Inpatient Hospital Stay (HOSPITAL_COMMUNITY): Payer: Medicare PPO

## 2020-08-02 ENCOUNTER — Encounter (HOSPITAL_COMMUNITY): Payer: Self-pay | Admitting: Neurosurgery

## 2020-08-02 DIAGNOSIS — G40909 Epilepsy, unspecified, not intractable, without status epilepticus: Secondary | ICD-10-CM

## 2020-08-02 DIAGNOSIS — J9601 Acute respiratory failure with hypoxia: Secondary | ICD-10-CM | POA: Diagnosis not present

## 2020-08-02 DIAGNOSIS — S22079A Unspecified fracture of T9-T10 vertebra, initial encounter for closed fracture: Secondary | ICD-10-CM

## 2020-08-02 DIAGNOSIS — N1831 Chronic kidney disease, stage 3a: Secondary | ICD-10-CM

## 2020-08-02 DIAGNOSIS — N179 Acute kidney failure, unspecified: Secondary | ICD-10-CM | POA: Diagnosis not present

## 2020-08-02 DIAGNOSIS — J90 Pleural effusion, not elsewhere classified: Secondary | ICD-10-CM | POA: Diagnosis not present

## 2020-08-02 DIAGNOSIS — J942 Hemothorax: Secondary | ICD-10-CM | POA: Diagnosis present

## 2020-08-02 DIAGNOSIS — R569 Unspecified convulsions: Secondary | ICD-10-CM

## 2020-08-02 DIAGNOSIS — G9341 Metabolic encephalopathy: Secondary | ICD-10-CM

## 2020-08-02 LAB — BPAM RBC
Blood Product Expiration Date: 202205182359
Blood Product Expiration Date: 202206052359
ISSUE DATE / TIME: 202205111245
ISSUE DATE / TIME: 202205111400
Unit Type and Rh: 5100
Unit Type and Rh: 5100

## 2020-08-02 LAB — CBC
HCT: 26.8 % — ABNORMAL LOW (ref 36.0–46.0)
HCT: 28.8 % — ABNORMAL LOW (ref 36.0–46.0)
Hemoglobin: 8.7 g/dL — ABNORMAL LOW (ref 12.0–15.0)
Hemoglobin: 9.5 g/dL — ABNORMAL LOW (ref 12.0–15.0)
MCH: 27.6 pg (ref 26.0–34.0)
MCH: 28.2 pg (ref 26.0–34.0)
MCHC: 32.5 g/dL (ref 30.0–36.0)
MCHC: 33 g/dL (ref 30.0–36.0)
MCV: 85.1 fL (ref 80.0–100.0)
MCV: 85.5 fL (ref 80.0–100.0)
Platelets: 160 10*3/uL (ref 150–400)
Platelets: 160 10*3/uL (ref 150–400)
RBC: 3.15 MIL/uL — ABNORMAL LOW (ref 3.87–5.11)
RBC: 3.37 MIL/uL — ABNORMAL LOW (ref 3.87–5.11)
RDW: 17.6 % — ABNORMAL HIGH (ref 11.5–15.5)
RDW: 17.2 % — ABNORMAL HIGH (ref 11.5–15.5)
WBC: 14.9 10*3/uL — ABNORMAL HIGH (ref 4.0–10.5)
WBC: 22.7 10*3/uL — ABNORMAL HIGH (ref 4.0–10.5)
nRBC: 0 % (ref 0.0–0.2)
nRBC: 0 % (ref 0.0–0.2)

## 2020-08-02 LAB — POCT I-STAT 7, (LYTES, BLD GAS, ICA,H+H)
Acid-base deficit: 6 mmol/L — ABNORMAL HIGH (ref 0.0–2.0)
Bicarbonate: 20.2 mmol/L (ref 20.0–28.0)
Calcium, Ion: 1.15 mmol/L (ref 1.15–1.40)
HCT: 25 % — ABNORMAL LOW (ref 36.0–46.0)
Hemoglobin: 8.5 g/dL — ABNORMAL LOW (ref 12.0–15.0)
O2 Saturation: 91 %
Patient temperature: 98.7
Potassium: 4.4 mmol/L (ref 3.5–5.1)
Sodium: 141 mmol/L (ref 135–145)
TCO2: 21 mmol/L — ABNORMAL LOW (ref 22–32)
pCO2 arterial: 40.1 mmHg (ref 32.0–48.0)
pH, Arterial: 7.309 — ABNORMAL LOW (ref 7.350–7.450)
pO2, Arterial: 68 mmHg — ABNORMAL LOW (ref 83.0–108.0)

## 2020-08-02 LAB — TYPE AND SCREEN
ABO/RH(D): O POS
Antibody Screen: NEGATIVE
Unit division: 0
Unit division: 0

## 2020-08-02 LAB — GLUCOSE, CAPILLARY
Glucose-Capillary: 166 mg/dL — ABNORMAL HIGH (ref 70–99)
Glucose-Capillary: 139 mg/dL — ABNORMAL HIGH (ref 70–99)
Glucose-Capillary: 106 mg/dL — ABNORMAL HIGH (ref 70–99)
Glucose-Capillary: 245 mg/dL — ABNORMAL HIGH (ref 70–99)
Glucose-Capillary: 255 mg/dL — ABNORMAL HIGH (ref 70–99)
Glucose-Capillary: 95 mg/dL (ref 70–99)
Glucose-Capillary: 132 mg/dL — ABNORMAL HIGH (ref 70–99)

## 2020-08-02 LAB — BASIC METABOLIC PANEL
Anion gap: 8 (ref 5–15)
BUN: 67 mg/dL — ABNORMAL HIGH (ref 8–23)
CO2: 20 mmol/L — ABNORMAL LOW (ref 22–32)
Calcium: 8.1 mg/dL — ABNORMAL LOW (ref 8.9–10.3)
Chloride: 110 mmol/L (ref 98–111)
Creatinine, Ser: 2.42 mg/dL — ABNORMAL HIGH (ref 0.44–1.00)
GFR, Estimated: 21 mL/min — ABNORMAL LOW (ref >60)
Glucose, Bld: 141 mg/dL — ABNORMAL HIGH (ref 70–99)
Potassium: 4.8 mmol/L (ref 3.5–5.1)
Sodium: 138 mmol/L (ref 135–145)

## 2020-08-02 IMAGING — CT CT ANGIO HEAD
2 of 7 series · 8 of 33 positions shown · IV contrast (OMNI 350)
Comparison: None.

CLINICAL DATA: Unresponsive

EXAM:
CT ANGIOGRAPHY HEAD AND NECK
TECHNIQUE: Multidetector CT imaging of the head and neck was performed using
the standard protocol during bolus administration of intravenous
contrast. Multiplanar CT image reconstructions and MIPs were
obtained to evaluate the vascular anatomy. Carotid stenosis
measurements (when applicable) are obtained utilizing NASCET
criteria, using the distal internal carotid diameter as the
denominator.
CONTRAST:  50mL OMNIPAQUE IOHEXOL 350 MG/ML SOLN

[Series 5: cta neck · axial · 0.43mm/px · z∈[-180,-66]mm · 2 of 173 slices shown]
[im 58/173  soft-tissue]
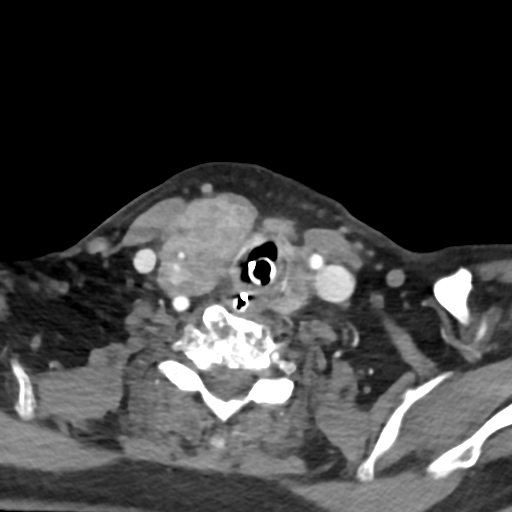
[im 115/173  bone]
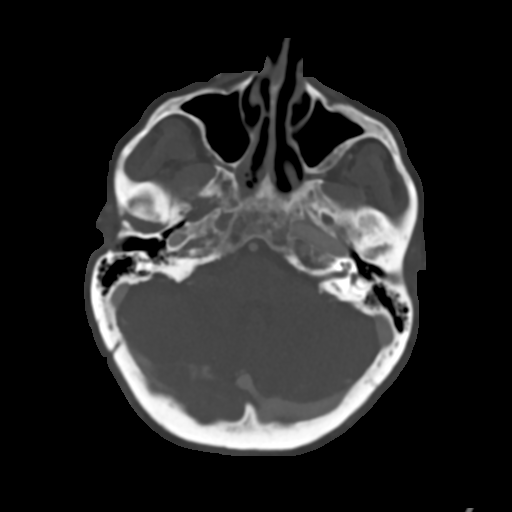

[Series 7: cta neck axial · axial · 0.39mm/px · z∈[-261,-22]mm · 6 of 345 slices shown]
[im 50/345  soft-tissue]
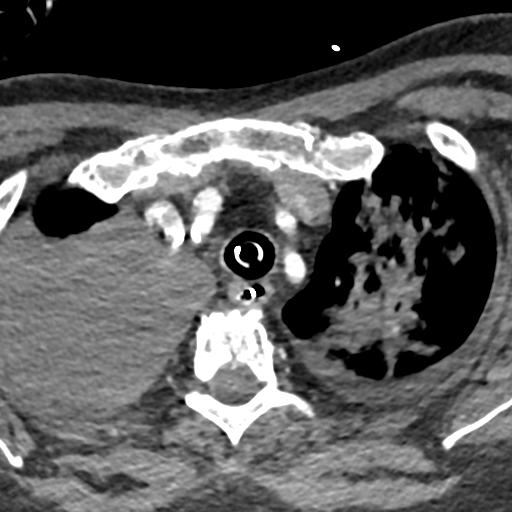
[im 99/345  soft-tissue]
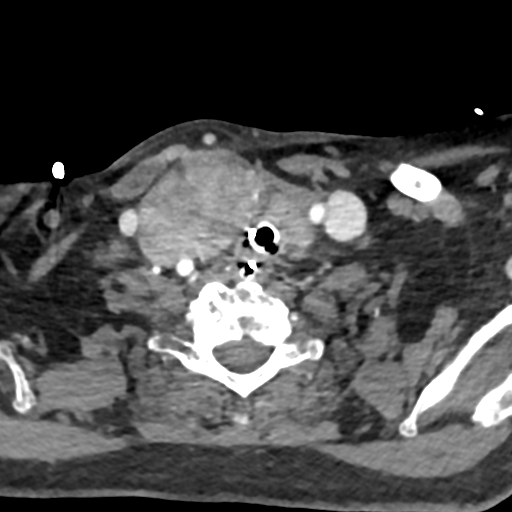
[im 148/345  soft-tissue]
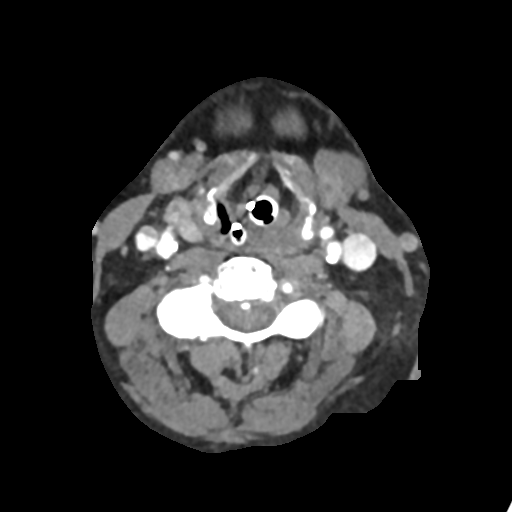
[im 197/345  soft-tissue]
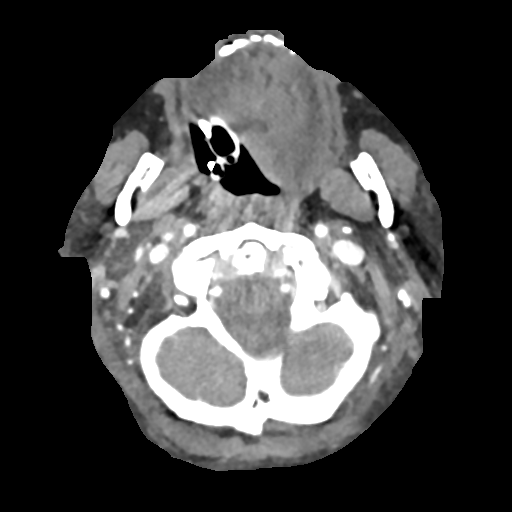
[im 246/345  soft-tissue]
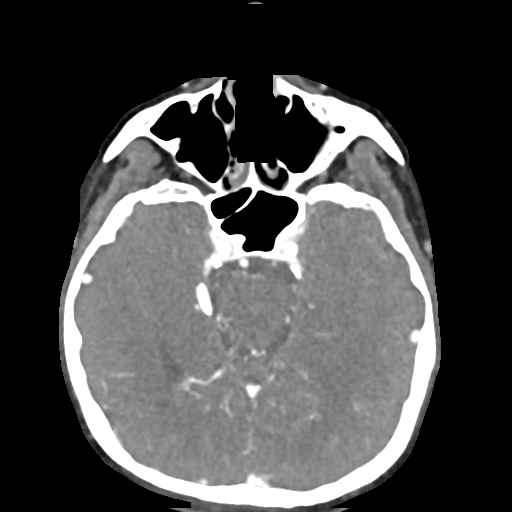
[im 295/345  soft-tissue]
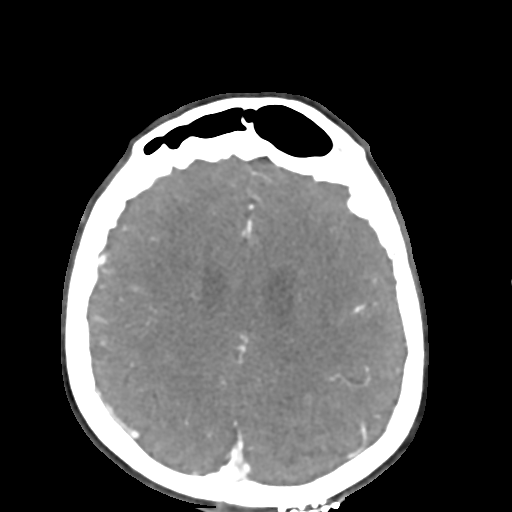

[8 of 33 positions shown; findings below may reference images not displayed]

FINDINGS: CTA NECK

Aortic arch: Calcified plaque along the arch and patent great vessel
origins.

Right carotid system: Patent. Common carotid is mildly displaced by
enlarged thyroid. Calcified plaque at the bifurcation and proximal
internal carotid with minimal stenosis.

Left carotid system: Patent. Calcified plaque at the common carotid
bifurcation and proximal internal carotid causing minimal stenosis.

Vertebral arteries: Patent and codominant.  No stenosis.

Skeleton: Advanced cervical spine degenerative changes. Degenerative
changes of temporomandibular joints.

Other neck: Enlarged, heterogeneous thyroid is evaluated on recent
ultrasound. Endotracheal and enteric tubes are present.

Upper chest: Right much greater than left pleural effusions and left
lung consolidation and ground-glass density.

Review of the MIP images confirms the above findings

CTA HEAD

Anterior circulation: The intracranial internal carotid arteries are
patent with calcified plaque causing mild stenosis. Anterior and
middle cerebral arteries are patent.

Posterior circulation: Intracranial vertebral arteries are patent
with calcified plaque causing up to moderate stenosis. Basilar
artery is patent. Major cerebellar artery origins are patent.
Bilateral posterior communicating arteries are present. Posterior
cerebral arteries are patent.

Venous sinuses: Patent as allowed by contrast bolus timing.

Review of the MIP images confirms the above findings
IMPRESSION: No large vessel occlusion, hemodynamically significant stenosis, or
evidence of dissection.

Partially imaged right much greater than left pleural effusions.
Density of pleural fluid is greater than simple fluid. Left lung
consolidation and ground-glass density is new from [DATE].

## 2020-08-02 IMAGING — CT CT HEAD CODE STROKE
3 series · 15 of 47 positions shown, 18 images · non-contrast
Comparison: [DATE]

CLINICAL DATA: Code stroke. Unresponsive. Code stroke presentation.

EXAM:
CT HEAD WITHOUT CONTRAST
TECHNIQUE: Contiguous axial images were obtained from the base of the skull
through the vertex without intravenous contrast.

[Series 4: head 5.0 st · axial · 0.41mm/px · z∈[-83,+47]mm · 9 of 32 slices shown, 12 images]
[im 3/32  brain]
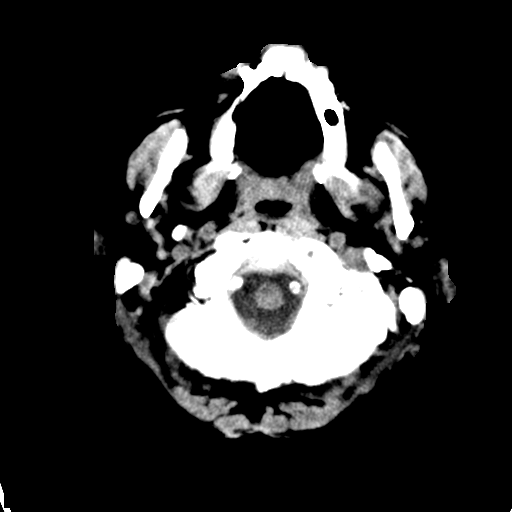
[im 3/32  bone]
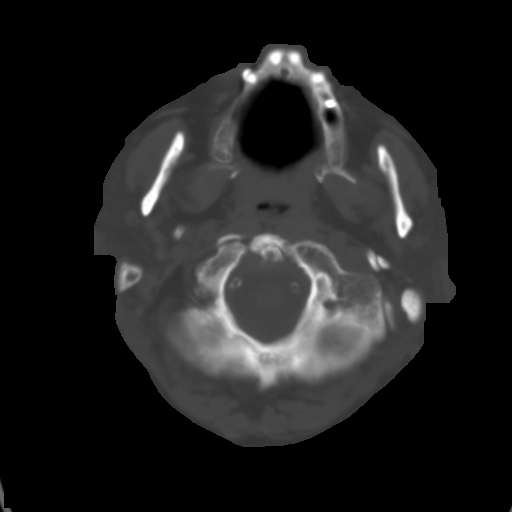
[im 6/32  brain]
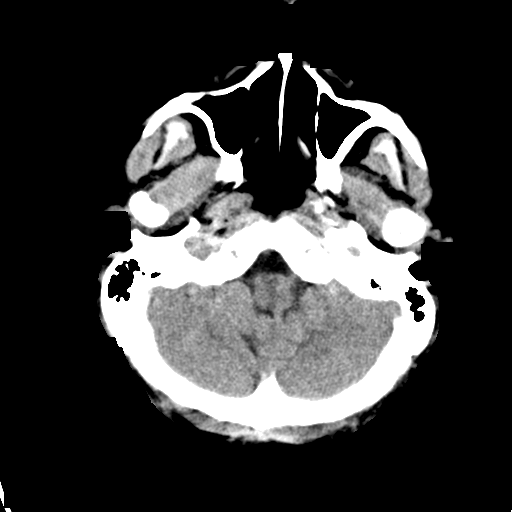
[im 9/32  brain]
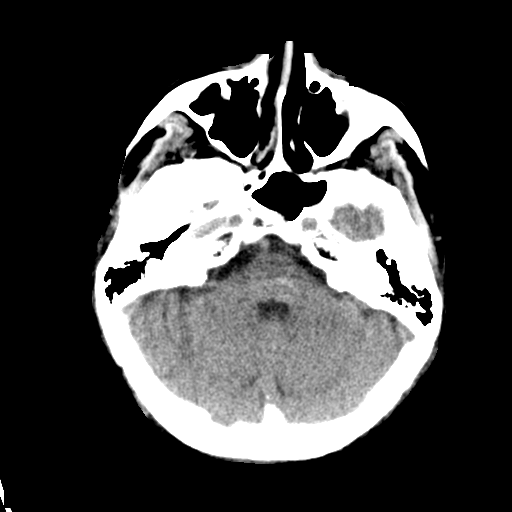
[im 12/32  brain]
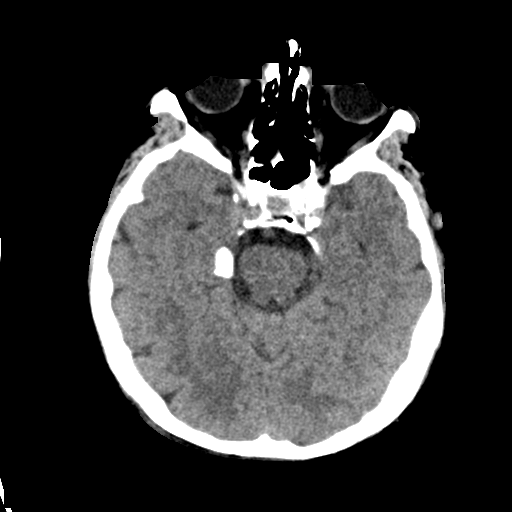
[im 17/32  brain]
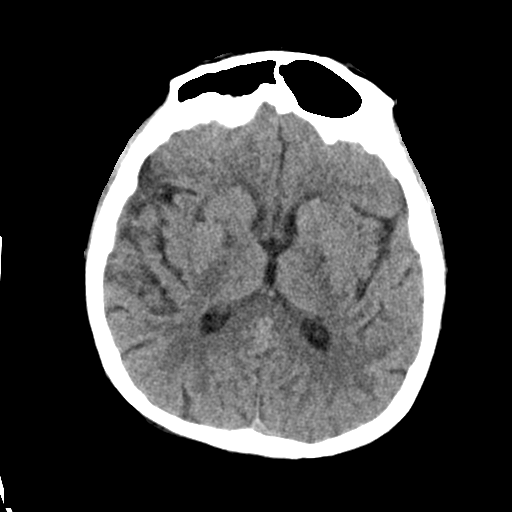
[im 17/32  bone]
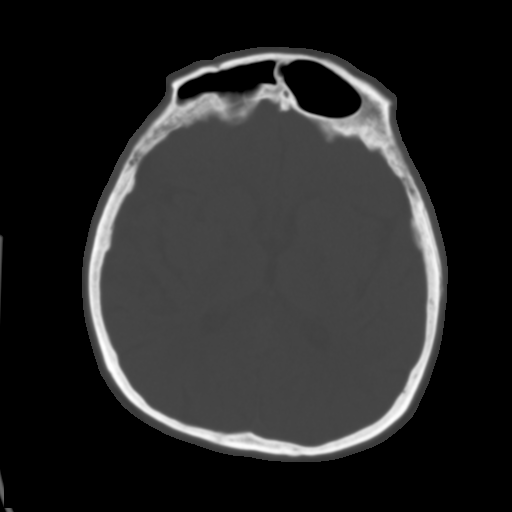
[im 20/32  brain]
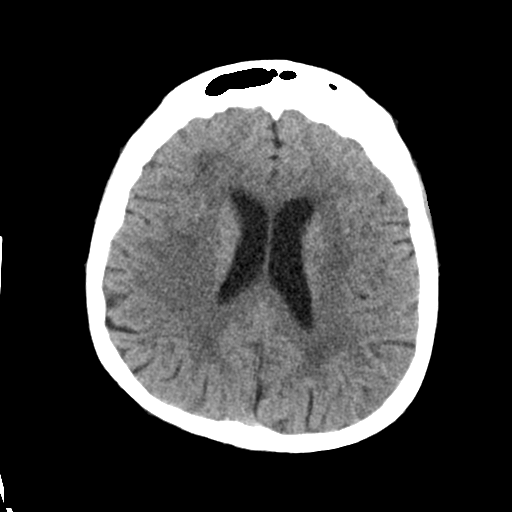
[im 23/32  brain]
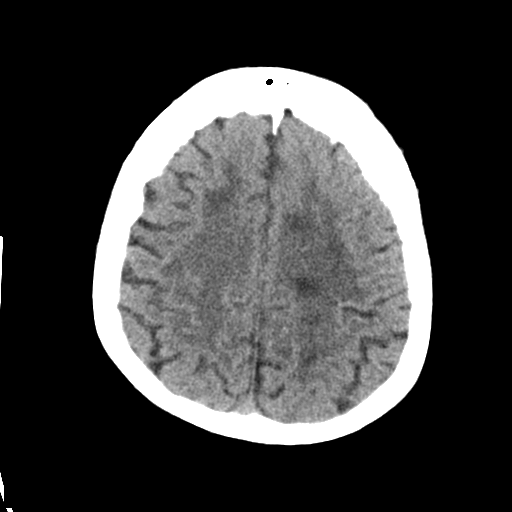
[im 26/32  brain]
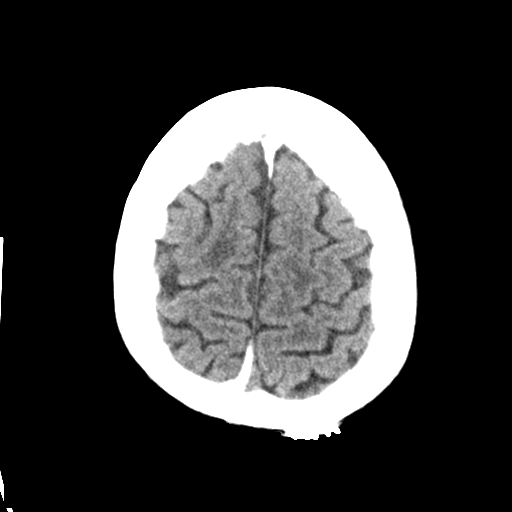
[im 29/32  brain]
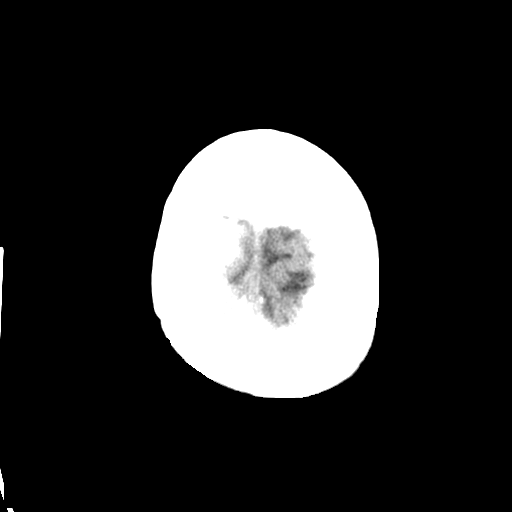
[im 29/32  bone]
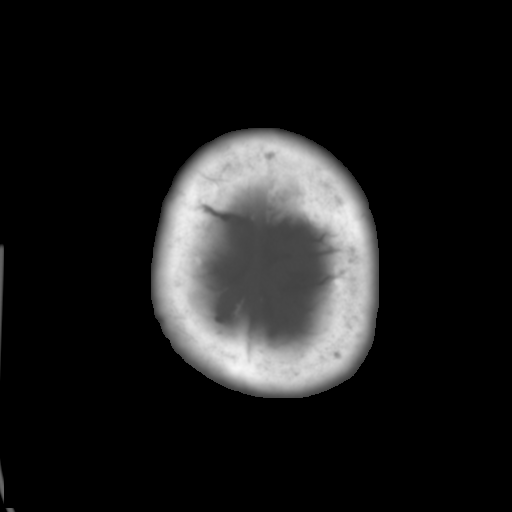

[Series 5: head 3.0 cor st · coronal · 0.31mm/px · 3 of 67 slices shown]
[im 23/67  brain]
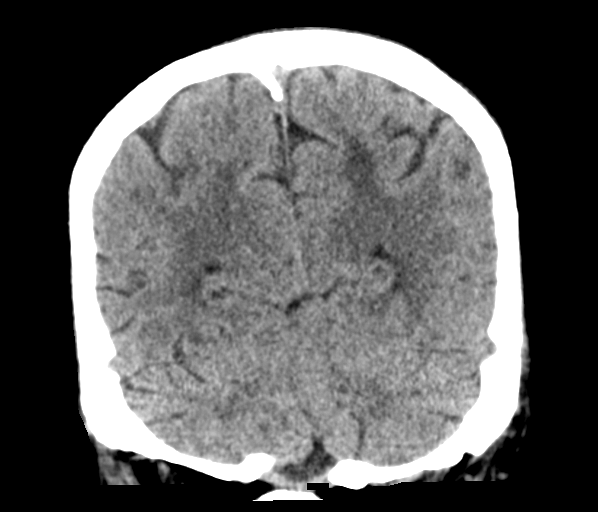
[im 30/67  brain]
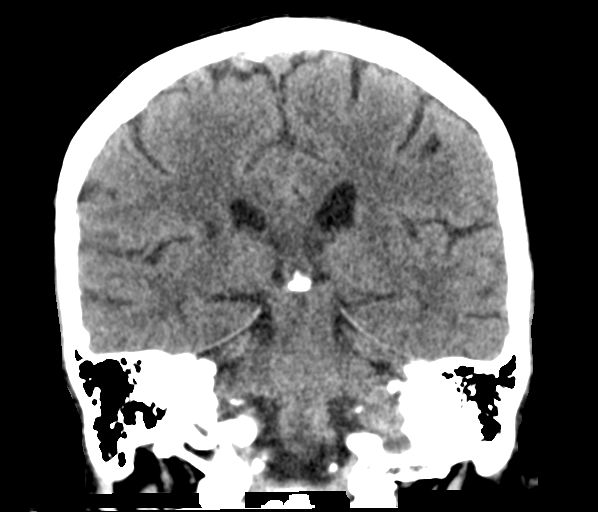
[im 37/67  brain]
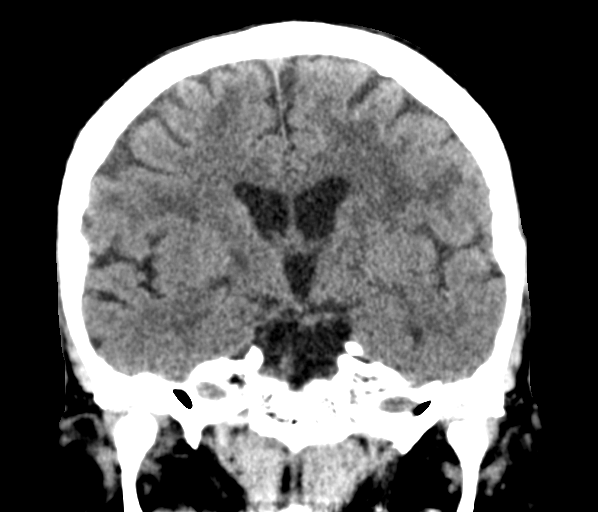

[Series 6: head 3.0 sag st · sagittal · 0.31mm/px · 3 of 61 slices shown]
[im 21/61  brain]
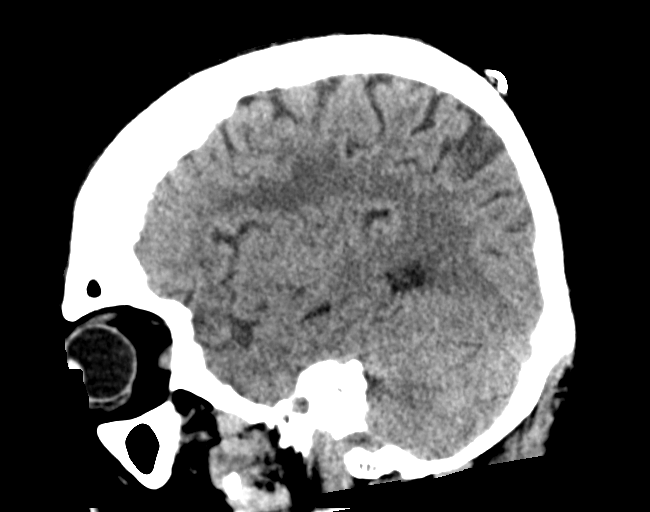
[im 31/61  brain]
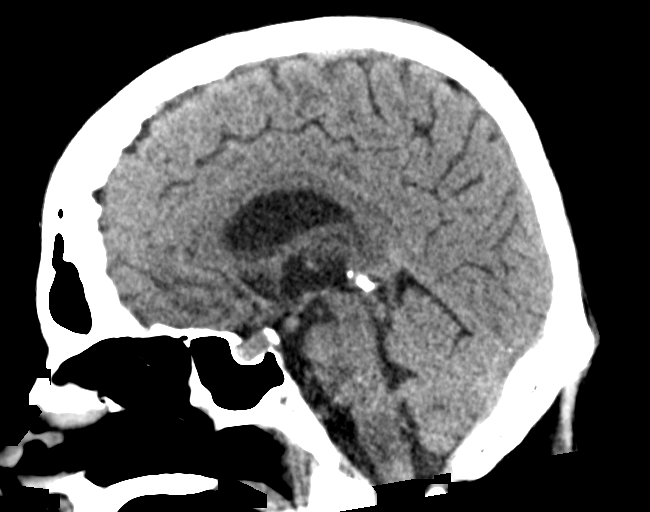
[im 41/61  brain]
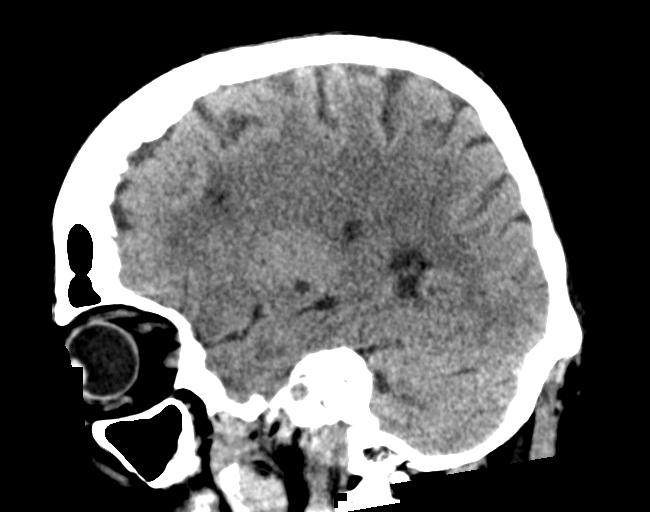

[15 of 47 positions shown; findings below may reference images not displayed]

FINDINGS: Brain: Chronic calcification in the right pons probably related to a
venous developmental anomaly. Cerebral hemispheres show extensive
chronic small-vessel ischemic changes throughout the deep and
subcortical white matter, basal ganglia and thalami. I do not
identify an acute infarction, but acute infarction could be hidden
within the extensive chronic changes. No mass, hemorrhage,
hydrocephalus or extra-axial collection.

Vascular: There is atherosclerotic calcification of the major
vessels at the base of the brain.

Skull: No skull fracture.

Sinuses/Orbits: Clear presently. Chronic mucoperiosteal thickening
of the left maxillary sinus. Orbits negative.

Other: Left posterior parietal scalp staples. Adjacent sebaceous
cyst of the scalp.

ASPECTS (Alberta Stroke Program Early CT Score)

- Ganglionic level infarction (caudate, lentiform nuclei, internal
capsule, insula, M1-M3 cortex): 7

- Supraganglionic infarction (M4-M6 cortex): 3

Total score (0-10 with 10 being normal): 10
IMPRESSION: 1. No acute finding by CT. Extensive chronic small-vessel ischemic
changes throughout the brain. Chronic calcification of the right
pons.
2. ASPECTS is 10
3. Discussed by telephone with Dr. PALEMON at [2Z] hours.

## 2020-08-02 MED ORDER — HYDROCORTISONE NA SUCCINATE PF 250 MG IJ SOLR
200.0000 mg | Freq: Once | INTRAMUSCULAR | Status: AC
  Administered 2020-08-02: 200 mg via INTRAVENOUS
  Filled 2020-08-02: qty 200

## 2020-08-02 MED ORDER — FENTANYL CITRATE (PF) 100 MCG/2ML IJ SOLN: 25.0000 ug | Freq: Once | INTRAMUSCULAR | Status: DC

## 2020-08-02 MED ORDER — PROPOFOL 1000 MG/100ML IV EMUL
INTRAVENOUS | Status: AC
  Administered 2020-08-02: 40 ug/kg/min via INTRAVENOUS
  Filled 2020-08-02: qty 100

## 2020-08-02 MED ORDER — CHLORHEXIDINE GLUCONATE CLOTH 2 % EX PADS
6.0000 | MEDICATED_PAD | Freq: Every day | CUTANEOUS | Status: DC
  Administered 2020-08-02 – 2020-08-08 (×6): 6 via TOPICAL

## 2020-08-02 MED ORDER — LIDOCAINE HCL 1 % IJ SOLN
INTRAMUSCULAR | Status: AC
  Filled 2020-08-02: qty 20

## 2020-08-02 MED ORDER — DIPHENHYDRAMINE HCL 25 MG PO CAPS: 50.0000 mg | ORAL_CAPSULE | Freq: Once | ORAL | Status: AC

## 2020-08-02 MED ORDER — CHLORHEXIDINE GLUCONATE 0.12% ORAL RINSE (MEDLINE KIT)
15.0000 mL | Freq: Two times a day (BID) | OROMUCOSAL | Status: DC
  Administered 2020-08-02 – 2020-08-07 (×8): 15 mL via OROMUCOSAL

## 2020-08-02 MED ORDER — FENTANYL 2500MCG IN NS 250ML (10MCG/ML) PREMIX INFUSION
25.0000 ug/h | INTRAVENOUS | Status: DC
  Administered 2020-08-02: 25 ug/h via INTRAVENOUS
  Filled 2020-08-02: qty 250

## 2020-08-02 MED ORDER — LEVETIRACETAM IN NACL 1000 MG/100ML IV SOLN
1000.0000 mg | INTRAVENOUS | Status: AC
  Administered 2020-08-02 (×2): 1000 mg via INTRAVENOUS
  Filled 2020-08-02 (×3): qty 100

## 2020-08-02 MED ORDER — DIPHENHYDRAMINE HCL 50 MG/ML IJ SOLN
INTRAMUSCULAR | Status: AC
  Administered 2020-08-02: 50 mg via INTRAVENOUS
  Filled 2020-08-02: qty 1

## 2020-08-02 MED ORDER — ORAL CARE MOUTH RINSE
15.0000 mL | OROMUCOSAL | Status: DC
  Administered 2020-08-02 – 2020-08-03 (×9): 15 mL via OROMUCOSAL

## 2020-08-02 MED ORDER — DIPHENHYDRAMINE HCL 50 MG/ML IJ SOLN: 50.0000 mg | Freq: Once | INTRAMUSCULAR | Status: AC

## 2020-08-02 MED ORDER — PROPOFOL 10 MG/ML IV BOLUS
INTRAVENOUS | Status: DC | PRN
  Administered 2020-08-02: 110 mg via INTRAVENOUS

## 2020-08-02 MED ORDER — FENTANYL BOLUS VIA INFUSION
25.0000 ug | INTRAVENOUS | Status: DC | PRN
  Filled 2020-08-02: qty 100

## 2020-08-02 MED ORDER — SUCCINYLCHOLINE CHLORIDE 20 MG/ML IJ SOLN
INTRAMUSCULAR | Status: DC | PRN
  Administered 2020-08-02: 100 mg via INTRAVENOUS

## 2020-08-02 MED ORDER — PROPOFOL 1000 MG/100ML IV EMUL
5.0000 ug/kg/min | INTRAVENOUS | Status: DC
  Administered 2020-08-02 (×2): 20 ug/kg/min via INTRAVENOUS
  Administered 2020-08-03: 15 ug/kg/min via INTRAVENOUS
  Filled 2020-08-02 (×2): qty 100

## 2020-08-02 MED ORDER — IOHEXOL 350 MG/ML SOLN
50.0000 mL | Freq: Once | INTRAVENOUS | Status: AC | PRN
  Administered 2020-08-02: 50 mL via INTRAVENOUS

## 2020-08-02 MED ORDER — LORAZEPAM 2 MG/ML IJ SOLN
INTRAMUSCULAR | Status: AC
  Filled 2020-08-02: qty 1

## 2020-08-02 MED ORDER — POLYETHYLENE GLYCOL 3350 17 G PO PACK
17.0000 g | PACK | Freq: Every day | ORAL | Status: DC
  Administered 2020-08-03: 17 g
  Filled 2020-08-02: qty 1

## 2020-08-02 NOTE — Significant Event (Signed)
Rapid Response Event Note   Reason for Call :  Sudden Respiratory failure O2 sats 70-80s and mental status change Per Staff they had just obtain consent for thoracentesis of pleural effusion and she was alert and oriented at her post surgical baseline.  Initial Focused Assessment:  Patient is not responding to staff.  Eyes are open pupils ditated. She is moving her upper extremities, right more than left.  She is very stiff and rigid.  Staff is bagging patient. O2 sats 88-89%.  Patient is breathing some on her own, trialed her on NRB O2 sats dropped to 82%.  Returned to ambu bag.   BP 166/87  ST 130-140s    Lung sounds are decreased She is mildly diaphoretic. Pupils are dilated and minimally reactive She will move her legs but not antigravity.  She does not respond to painful stim of leg.   NIHSS 25  Dr Doristine Bosworth and Dr Jacqualyn Posey at bedside to assess patient.  Interventions:  Anesthesia called for urgent intubation.  They initially placed an LMA, continued bagging via LMA until neurohospitalist at bedside to assess patient  O2 sats 99% 1337 Code Stroke Called Dr Sal at bedside to assess patient 1348 Intubated Started Propofol gtt for sedation 1405 Stat Head CT done Both PIVs became dislodged Placed PIV in right FA and Left AC Premedicated with solumedrol and benadryl CTA head and neck done Keppra given IV  1525  Transported to 4N ICU  Plan of Care:     Event Summary:   MD Notified: Pahwani Call Time: Joaquin Time: 1320 End Time: [place]  Raliegh Ip, RN

## 2020-08-02 NOTE — Procedures (Signed)
Patient Name: Jacqueline Richmond  MRN: [id number]  Epilepsy Attending: Lora Havens  Referring Physician/Provider: Dr Donnetta Simpers Date: 08/02/2020 Duration: 24.40 mins  Patient history: 75 y.o. female with PMH significant for CKD, HTN, Gout, HTN, DM2, HLd who is admitted with T spine fx s/p fall who had sudden unresponsiveness with stiffening of BL upper extremities and starring off. EEG to evaluate for seizure  Level of alertness:  comatose  AEDs during EEG study: LEV  Technical aspects: This EEG study was done with scalp electrodes positioned according to the 10-20 International system of electrode placement. Electrical activity was acquired at a sampling rate of 500Hz  and reviewed with a high frequency filter of 70Hz  and a low frequency filter of 1Hz . EEG data were recorded continuously and digitally stored.   Description: EEG showed continuous generalized 3 to 6 Hz theta-delta slowing admixed with 15-18hz  fronto-central beta activity. Hyperventilation and photic stimulation were not performed.     ABNORMALITY - Continuous slow, generalized  IMPRESSION: This study is suggestive of moderate diffuse encephalopathy, nonspecific etiology but likely related to sedation. No seizures or epileptiform discharges were seen throughout the recording.  Jacqueline Richmond Jacqueline Richmond

## 2020-08-02 NOTE — Progress Notes (Signed)
vLTM started.  Notified [facility] monitoring.  No skin breakdown observed.

## 2020-08-02 NOTE — Progress Notes (Addendum)
PROGRESS NOTE    Jacqueline Richmond  DDU:[id number] DOB: Aug 21, 1945 DOA: 07/31/2020 PCP: Bernerd Limbo, MD   Brief Narrative:  HPI: Jacqueline Richmond is a 75 y.o. female with medical history significant for type 2 diabetes, essential hypertension, CKD 4, who presented to droppage ED after a mechanical fall at home.  She reports that she was sitting in a rocking chair in her porch for 1 to 2 hours then she got up loss of balance and fell on the steps hitting her head.  She denies loss of consciousness.  She presented to the ED for further evaluation.  CT head nonacute.  CT chest without contrast showed extension injury of the thoracic spine with displaced T10 vertebral body fracture extending into the T9-T10 disc space and anterior vertebral body.  Fracture extends through anterior osteophytes as well as the anterior superior aspect of T11 vertebral body.  There is adjacent paravertebral hemorrhage and small volume of gas likely related to escape vacuum phenomenon.  Neurosurgery was consulted due to these findings.  Also showed on CT chest without contrast moderate to large right pleural effusion, heterogeneous density, may represent hemorrhage, collapsed lung or less likely masses, trauma surgery Dr. Kae Heller was consulted due to these findings.Marland Kitchen  Heterogeneously enlarged right lobe of the thyroid gland with scattered curvilinear calcifications.  Recommend thyroid ultrasound.  TRH, hospitalist team, was asked to admit.  Patient was accepted as a direct admit to [facility] telemetry medical unit.  ED Course:  Afebrile.  BP 134/75, pulse 80, respiratory 18, O2 saturation 99% on 2 L.  Lab studies were remarkable for serum bicarb 21, anion gap 10, BUN 61, creatinine 2.05, GFR 25.  WBC 13.5, hemoglobin 11.2, platelet 231.  Assessment & Plan:   Active Problems:   Acute renal failure superimposed on stage 3a chronic kidney disease (HCC)   Hyperkalemia   HTN (hypertension)   T10 vertebral fracture (HCC)   Hemothorax on  right  Displaced T10 vertebral fracture post mechanical fall at home: Neurosurgery on board and now she is status post T10-11 stabilization for unstable T10 fracture on 08/01/2020.  TLSO brace has been ordered by neurosurgery.  We will mobilize once it arrives.  She is doing well.  Appreciate neurosurgery help and defer management to them.  Large right pleural effusion, unclear etiology CT chest without contrast moderate to large right pleural effusion, heterogeneous density, may represent hemorrhage, collapsed lung or less likely masses.  Trauma surgery on board but no comment regarding this on their note.  I have consulted IR for therapeutic thoracentesis.  AKI on CKD stage IIIa: Her baseline creatinine seems to be around 1.4.  Currently 2.68>2.42.  Slight improvement.  Secondary to all the trauma that she had.  Continue gentle hydration.  Avoid nephrotoxic agents.  Repeat labs in the morning.  Might see improvement after thoracentesis.  Hyperkalemia: Resolved  Leukocytosis, likely reactive in the setting of fracture, Improved.  Monitor.  Essential hypertension: Blood pressure stable.  Continue beta-blocker but continue to hold clonidine.  Heterogeneously enlarged right lobe of the thyroid gland with scattered curvilinear calcifications.  TSH within normal range.  Ultrasound shows 1 nodule that meets criteria for FNA.  Not urgent at this point in time.  We will see if this can be done as inpatient once she is more stable.  Otherwise can be done as outpatient.  Type 2 diabetes mellitus: Continue SSI.  Nausea and vomiting: Resolved but NG tube is still in place.  Trauma surgery on board  and management per them.  DVT prophylaxis: SCDs Start: 08/02/20 0750   Code Status: Full Code  Family Communication: None present at bedside.  Plan of care discussed with patient in length and he verbalized understanding and agreed with it.  Status is: Inpatient  Remains inpatient appropriate  because:Inpatient level of care appropriate due to severity of illness   Dispo:  Patient From: Home  Planned Disposition: Home  Medically stable for discharge: No          Estimated body mass index is 32.95 kg/m as calculated from the following:   Height as of this encounter: 5\' 3"  (1.6 m).   Weight as of this encounter: 84.4 kg.      Nutritional status:               Consultants:   Trauma surgery  Neurosurgery  Procedures:   As above  Antimicrobials:  Anti-infectives (From admission, onward)   None         Subjective: Seen and examined.  Feels well except pain in the back.  No shortness of breath.  Looks comfortable.  Objective: Vitals:   08/01/20 1944 08/01/20 2312 08/02/20 0522 08/02/20 0801  BP: (!) 153/67 (!) 154/69 (!) 124/54 (!) 131/54  Pulse: 85 88 89 89  Resp: 17 17 16 16   Temp: (!) 97.5 F (36.4 C) (!) 97.5 F (36.4 C) 98.5 F (36.9 C) 98.5 F (36.9 C)  TempSrc: Oral Oral Oral Oral  SpO2: 96% 98% 96% 95%  Weight:      Height:        Intake/Output Summary (Last 24 hours) at 08/02/2020 1145 Last data filed at 08/01/2020 2342 Gross per 24 hour  Intake 1875 ml  Output 850 ml  Net 1025 ml   Filed Weights   07/31/20 1708  Weight: 84.4 kg    Examination:  General exam: Appears calm and comfortable, obese Respiratory system: Diminished breath sounds in the right middle and lower lobe,. Respiratory effort normal. Cardiovascular system: S1 & S2 heard, RRR. No JVD, murmurs, rubs, gallops or clicks. No pedal edema. Gastrointestinal system: Abdomen is nondistended, soft and nontender. No organomegaly or masses felt. Normal bowel sounds heard. Central nervous system: Alert and oriented. No focal neurological deficits. Extremities: Symmetric 5 x 5 power. Skin: Dressing in the thoracic vertebrae area in the back Psychiatry: Judgement and insight appear normal. Mood & affect appropriate.   Data Reviewed: I have personally reviewed  following labs and imaging studies  CBC: Recent Labs  Lab 07/31/20 1755 08/01/20 0559 08/01/20 1226 08/01/20 1336 08/01/20 1549 08/02/20 0603  WBC 13.5* 18.5*  --   --  15.1* 14.9*  HGB 11.2* 8.6* 7.8* 8.2* 10.4* 8.7*  HCT 35.1* 27.4* 23.0* 24.0* 31.4* 26.8*  MCV 86.9 89.8  --   --  86.5 85.1  PLT 231 214  --   --  163 734   Basic Metabolic Panel: Recent Labs  Lab 07/31/20 1755 08/01/20 0559 08/01/20 1226 08/01/20 1336 08/02/20 0603  NA 135 137 140 140 138  K 4.5 5.6* 4.5 4.9 4.8  CL 104 107 109  --  110  CO2 21* 22  --   --  20*  GLUCOSE 113* 194* 156*  --  141*  BUN 61* 68* 81*  --  67*  CREATININE 2.05* 2.68* 2.60*  --  2.42*  CALCIUM 9.4 8.7*  --   --  8.1*  MG  --  1.9  --   --   --  PHOS  --  6.4*  --   --   --    GFR: Estimated Creatinine Clearance: 21.3 mL/min (A) (by C-G formula based on SCr of 2.42 mg/dL (H)). Liver Function Tests: Recent Labs  Lab 08/01/20 0559  AST 21  ALT 15  ALKPHOS 86  BILITOT 0.7  PROT 6.4*  ALBUMIN 2.7*   No results for input(s): LIPASE, AMYLASE in the last 168 hours. No results for input(s): AMMONIA in the last 168 hours. Coagulation Profile: No results for input(s): INR, PROTIME in the last 168 hours. Cardiac Enzymes: No results for input(s): CKTOTAL, CKMB, CKMBINDEX, TROPONINI in the last 168 hours. BNP (last 3 results) No results for input(s): PROBNP in the last 8760 hours. HbA1C: Recent Labs    08/01/20 0602  HGBA1C 5.4   CBG: Recent Labs  Lab 08/01/20 1509 08/01/20 1630 08/01/20 2126 08/02/20 0644 08/02/20 1116  GLUCAP 152* 151* 149* 139* 106*   Lipid Profile: No results for input(s): CHOL, HDL, LDLCALC, TRIG, CHOLHDL, LDLDIRECT in the last 72 hours. Thyroid Function Tests: Recent Labs    08/01/20 0657  TSH 1.188   Anemia Panel: No results for input(s): VITAMINB12, FOLATE, FERRITIN, TIBC, IRON, RETICCTPCT in the last 72 hours. Sepsis Labs: Recent Labs  Lab 08/01/20 0018  LATICACIDVEN 1.7     Recent Results (from the past 240 hour(s))  Resp Panel by RT-PCR (Flu A&B, Covid) Nasopharyngeal Swab     Status: None   Collection Time: 07/31/20 11:08 PM   Specimen: Nasopharyngeal Swab; Nasopharyngeal(NP) swabs in vial transport medium  Result Value Ref Range Status   SARS Coronavirus 2 by RT PCR NEGATIVE NEGATIVE Final    Comment: (NOTE) SARS-CoV-2 target nucleic acids are NOT DETECTED.  The SARS-CoV-2 RNA is generally detectable in upper respiratory specimens during the acute phase of infection. The lowest concentration of SARS-CoV-2 viral copies this assay can detect is 138 copies/mL. A negative result does not preclude SARS-Cov-2 infection and should not be used as the sole basis for treatment or other patient management decisions. A negative result may occur with  improper specimen collection/handling, submission of specimen other than nasopharyngeal swab, presence of viral mutation(s) within the areas targeted by this assay, and inadequate number of viral copies(<138 copies/mL). A negative result must be combined with clinical observations, patient history, and epidemiological information. The expected result is Negative.  Fact Sheet for Patients:  EntrepreneurPulse.com.au  Fact Sheet for Healthcare Providers:  IncredibleEmployment.be  This test is no t yet approved or cleared by the [place] FDA and  has been authorized for detection and/or diagnosis of SARS-CoV-2 by FDA under an Emergency Use Authorization (EUA). This EUA will remain  in effect (meaning this test can be used) for the duration of the COVID-19 declaration under Section 564(b)(1) of the Act, 21 U.S.C.section 360bbb-3(b)(1), unless the authorization is terminated  or revoked sooner.       Influenza A by PCR NEGATIVE NEGATIVE Final   Influenza B by PCR NEGATIVE NEGATIVE Final    Comment: (NOTE) The Xpert Xpress SARS-CoV-2/FLU/RSV plus assay is intended as an  aid in the diagnosis of influenza from Nasopharyngeal swab specimens and should not be used as a sole basis for treatment. Nasal washings and aspirates are unacceptable for Xpert Xpress SARS-CoV-2/FLU/RSV testing.  Fact Sheet for Patients: EntrepreneurPulse.com.au  Fact Sheet for Healthcare Providers: IncredibleEmployment.be  This test is not yet approved or cleared by the [place] FDA and has been authorized for detection and/or diagnosis of SARS-CoV-2 by  FDA under an Emergency Use Authorization (EUA). This EUA will remain in effect (meaning this test can be used) for the duration of the COVID-19 declaration under Section 564(b)(1) of the Act, 21 U.S.C. section 360bbb-3(b)(1), unless the authorization is terminated or revoked.  Performed at [facility], [address] [place], [place] [address]   MRSA PCR Screening     Status: None   Collection Time: 08/01/20  2:59 AM   Specimen: Nasal Mucosa; Nasopharyngeal  Result Value Ref Range Status   MRSA by PCR NEGATIVE NEGATIVE Final    Comment:        The GeneXpert MRSA Assay (FDA approved for NASAL specimens only), is one component of a comprehensive MRSA colonization surveillance program. It is not intended to diagnose MRSA infection nor to guide or monitor treatment for MRSA infections. Performed at [facility], [place] [address], [place], [place] [address]       Radiology Studies: CT ABDOMEN PELVIS WO CONTRAST  Result Date: 08/02/2020 CLINICAL DATA:  Nausea and vomiting. EXAM: CT ABDOMEN AND PELVIS WITHOUT CONTRAST TECHNIQUE: Multidetector CT imaging of the abdomen and pelvis was performed following the standard protocol without IV contrast. COMPARISON:  X-ray abdomen 08/01/2020 FINDINGS: Lower chest: Interval increase in size of a heterogeneous moderate to large volume right pleural effusion. Associated complete collapse of the right lower lobe. Trace left  pleural effusion. Hepatobiliary: No focal liver abnormality is seen. Status post cholecystectomy. No biliary dilatation. Pancreas: No focal lesion. Normal pancreatic contour. No surrounding inflammatory changes. No main pancreatic ductal dilatation. Spleen: Normal in size without focal abnormality. Adrenals/Urinary Tract: No adrenal nodule bilaterally. Nonspecific bilateral perinephric stranding. Bilateral renal calcifications measuring up to 5 mm. No hydronephrosis. Exophytic 1.7 cm fluid density lesion within the right kidney likely represents a simple renal cyst. Otherwise no contour-deforming renal mass. No ureterolithiasis or hydroureter. Limited evaluation of distal ureters due to streak artifact originating from the right femoral surgical hardware. The urinary bladder is decompressed with a Foley catheter terminating within its lumen. There is limited evaluation of the urinary bladder due to streak artifact originating from the right surgical femoral hardware. Stomach/Bowel: Enteric tube coursing within the gastric lumen with side port just distal to the gastroesophageal junction. Stomach is within normal limits. No evidence of bowel wall thickening or dilatation. Scattered colonic diverticulosis. Appendix appears normal. Vascular/Lymphatic: No abdominal aorta or iliac aneurysm. Moderate atherosclerotic plaque of the aorta and its branches. No abdominal, pelvic, or inguinal lymphadenopathy. Reproductive: Uterus and bilateral adnexa are unremarkable. Other: No intraperitoneal free fluid. No intraperitoneal free gas. No organized fluid collection. Musculoskeletal: Anterior abdominal hernia repair with mesh. Interval placement of partially visualized posterolateral thoracic fusion surgical hardware in a patient with T10-T11 acute vertebral body fracture with extension to the posterior elements. Slight angulation of the left T11 (3:12) surgical hardware with no violation of the spinal canal or findings to suggest  impingement on the nerve roots. Overlying skin staples and subcutaneus soft tissue edema. Multilevel severe degenerative changes of the spine with fusion of the L3 through L5 vertebral bodies leading to straightening of the normal lumbar lordosis. Mild retrolisthesis of L5 on S1. No suspicious lytic or blastic osseous lesions. Total right hip arthroplasty.  Degenerative changes of the left hip. IMPRESSION: 1. Interval increase in size of a heterogeneous moderate to large volume right pleural effusion. Areas of increased density within the pleural fluid may represent hemothorax, collapsed right lower lobe lung, or less likely a mass lesion. 2. Interval  development of trace left pleural effusion. 3. Nonspecific bilateral perinephric stranding. Consider correlation with urinalysis for infection. 4. Bilateral nonobstructive nephrolithiasis measuring up to 5 mm. 5. Scattered colonic diverticulosis with no acute diverticulitis. 6. Known T10-T11 fracture with interval placement of partially visualized posterolateral thoracic fusion surgical hardware. 7. Severe degenerative changes of the lumbar spine with L3 through L5 vertebral body fusion. 8.  Aortic Atherosclerosis (ICD10-I70.0). Electronically Signed   By: Iven Finn M.D.   On: 08/02/2020 00:09   DG Ribs Bilateral W/Chest  Result Date: 07/31/2020 CLINICAL DATA:  Fall.  Pain. EXAM: BILATERAL RIBS AND CHEST - 4+ VIEW COMPARISON:  Remote chest radiograph 05/03/2012. No interval chest imaging. FINDINGS: No fracture or other bone lesions are seen involving the ribs. Right pleural effusion at least moderate in size. No pneumothorax. Heart size and mediastinal contours are within normal limits. Aortic atherosclerosis. IMPRESSION: 1. No evidence of rib fracture. 2. Right pleural effusion at least moderate in size. This is of unknown acuity. Electronically Signed   By: Keith Rake M.D.   On: 07/31/2020 20:39   DG Thoracic Spine 2 View  Result Date:  07/31/2020 CLINICAL DATA:  Unwitnessed fall.  Pain. EXAM: THORACIC SPINE 2 VIEWS COMPARISON:  None. FINDINGS: Broad-based dextroscoliotic curvature. No evidence of acute fracture or compression deformity. Flowing anterior osteophytes throughout the thoracic spine. Minimal disc space narrowing at T10-T11 and T11-T12. no paravertebral soft tissue abnormality IMPRESSION: 1. No radiographic evidence of acute fracture of the thoracic spine. 2. Scoliosis and degenerative change. Electronically Signed   By: Keith Rake M.D.   On: 07/31/2020 20:36   DG THORACOLUMABAR SPINE  Result Date: 08/01/2020 CLINICAL DATA:  T8 through 11 fusion. EXAM: THORACOLUMBAR SPINE 1V COMPARISON:  Preoperative imaging. FINDINGS: Two fluoroscopic spot views of the thoracic spine obtained in frontal and lateral projections. Pedicle rods in the lower thoracic spine, levels difficult to accurately delineate on coned view. Total fluoroscopy time 20 seconds. Total dose 7.78 mGy. IMPRESSION: Intraoperative fluoroscopy during thoracic fusion. Electronically Signed   By: Keith Rake M.D.   On: 08/01/2020 16:16   DG Lumbar Spine Complete  Result Date: 07/31/2020 CLINICAL DATA:  Unwitnessed fall.  Pain. EXAM: LUMBAR SPINE - COMPLETE 4+ VIEW COMPARISON:  None. FINDINGS: Five non-rib-bearing lumbar vertebra. Straightening of normal lordosis. Near complete disc space loss at L4-L5 with prominent disc space narrowing at L3-L4 and L5-S1. Endplate spurring with lesser disc space narrowing at L1-L2 and L2-L3. Multilevel facet hypertrophy. Vertebral body heights are preserved. No evidence of fracture. Degenerative change of both sacroiliac joints. IMPRESSION: 1. No fracture of the lumbar spine. 2. Straightening of normal lordosis may be positioning or muscle spasm. Multilevel degenerative disc disease and facet hypertrophy. Electronically Signed   By: Keith Rake M.D.   On: 07/31/2020 20:37   DG Forearm Left  Result Date:  08/01/2020 CLINICAL DATA:  Fall yesterday with swelling. EXAM: LEFT FOREARM - 2 VIEW COMPARISON:  None. FINDINGS: Advanced degenerative changes involving the carpal bones and radiocarpal articulation. There is also more mild to moderate degenerative change about the elbow. No acute fracture or dislocation. No elbow joint effusion. Suboptimal patient positioning secondary to clinical status. Possible soft tissue swelling adjacent the proximal ulna. IMPRESSION: Advanced degenerative change, without acute osseous abnormality. Electronically Signed   By: Abigail Miyamoto M.D.   On: 08/01/2020 13:48   CT Head Wo Contrast  Result Date: 07/31/2020 CLINICAL DATA:  Recent fall from chair, initial encounter EXAM: CT HEAD WITHOUT CONTRAST CT CERVICAL  SPINE WITHOUT CONTRAST TECHNIQUE: Multidetector CT imaging of the head and cervical spine was performed following the standard protocol without intravenous contrast. Multiplanar CT image reconstructions of the cervical spine were also generated. COMPARISON:  None. FINDINGS: CT HEAD FINDINGS Brain: No evidence of acute infarction, hemorrhage, hydrocephalus, extra-axial collection or mass lesion/mass effect. Calcification is noted in the midportion of the pons. Mild chronic white matter ischemic changes are noted. Vascular: No hyperdense vessel or unexpected calcification. Skull: Normal. Negative for fracture or focal lesion. Sinuses/Orbits: No acute finding. Other: Mild parietal scalp hematoma is noted in the vertex on the left. CT CERVICAL SPINE FINDINGS Alignment: Mild loss of normal cervical lordosis is noted likely related to muscular spasm Skull base and vertebrae: 7 cervical segments are well visualized. Vertebral body height is well maintained. Multilevel osteophytic changes and facet hypertrophic changes are noted. No acute fracture or acute facet abnormality is seen. Soft tissues and spinal canal: Surrounding soft tissue structures show vascular calcifications. Enlargement  of the right lobe of the thyroid is noted with evidence of calcifications and a nodule which measures at least 2.5 cm. No other soft tissue abnormality is noted. Upper chest: Visualized lung apices demonstrate evidence of a right-sided pleural effusion. This is of uncertain chronicity. No rib abnormality is noted. Other: None IMPRESSION: CT of the head: Left parietal scalp hematoma. No acute intracranial abnormality is noted. CT of the cervical spine: Multilevel degenerative changes without acute bony abnormality. Right-sided pleural effusion. Prominent right lobe of the thyroid with at least 1 nodule within. Recommend nonemergent thyroid US (ref: J Am Coll Radiol. 2015 Feb;12(2): 143-50). Electronically Signed   By: Inez Catalina M.D.   On: 07/31/2020 18:10   CT Chest Wo Contrast  Result Date: 07/31/2020 CLINICAL DATA:  Fall.  Chest trauma. EXAM: CT CHEST WITHOUT CONTRAST TECHNIQUE: Multidetector CT imaging of the chest was performed following the standard protocol without IV contrast. COMPARISON:  Ribbon thoracic spine radiographs earlier today. FINDINGS: Cardiovascular: Moderate aortic atherosclerosis. No periaortic stranding to suggest injury. There are coronary artery calcifications. Heart is normal in size, slightly displaced into the left chest. No pericardial effusion. Mediastinum/Nodes: Scattered small mediastinal lymph nodes, not enlarged by size criteria. No esophageal wall thickening. Heterogeneously enlarged right lobe of the thyroid gland with scattered curvilinear calcifications. Included left thyroid lobe is normal. Lungs/Pleura: Moderate to large right pleural effusion, heterogeneous density. Areas of increased density within the pleural fluid may represent hemorrhage, collapsed lung or less likely masses. Small amount of pleural fluid appears loculated and tracks anteromedially. No pneumothorax. Left lung is clear. No left pleural effusion. Upper Abdomen: No definite free fluid in the upper  abdomen. Diverticulosis at the splenic flexure of the colon. Cholecystectomy. Musculoskeletal: Bones are diffusely under mineralized. Diffuse thoracic ankylosis. Displaced fracture through T10 vertebral body involves the anterior third, extend into the T9-T10 disc space, and is displaced anteriorly. Fracture extends through anterior osteophytes as well as the anterior superior aspect of T11 vertebral body. No convincing extension to involve the posterior elements. There is adjacent paravertebral hemorrhage and small volume of gas, likely related to escaped vacuum phenomenon. No additional thoracic spine fracture. Diffuse ankylosis with flowing anterior osteophytes. No acute fracture of the ribs, included clavicles or shoulder girdles. No sternal fracture. IMPRESSION: 1. Extension injury of the thoracic spine with displaced T10 vertebral body fracture extending into the T9-T10 disc space and anterior vertebral body. Fracture extends through anterior osteophytes as well as the anterior superior aspect of T11 vertebral body. No  convincing extension to involve the posterior elements. There is adjacent paravertebral hemorrhage and small volume of gas, likely related to escaped vacuum phenomenon. Recommend spine consultation. 2. Moderate to large right pleural effusion, heterogeneous density. Areas of increased density within the pleural fluid may represent hemorrhage, collapsed lung or less likely masses. Recommend continued radiographic follow-up to ensure resolution. No pneumothorax or rib fractures. 3. Heterogeneously enlarged right lobe of the thyroid gland with scattered curvilinear calcifications. Recommend thyroid US (ref: J Am Coll Radiol. 2015 Feb;12(2): 143-50). Aortic Atherosclerosis (ICD10-I70.0). Electronically Signed   By: Keith Rake M.D.   On: 07/31/2020 22:29   CT Cervical Spine Wo Contrast  Result Date: 07/31/2020 CLINICAL DATA:  Recent fall from chair, initial encounter EXAM: CT HEAD WITHOUT  CONTRAST CT CERVICAL SPINE WITHOUT CONTRAST TECHNIQUE: Multidetector CT imaging of the head and cervical spine was performed following the standard protocol without intravenous contrast. Multiplanar CT image reconstructions of the cervical spine were also generated. COMPARISON:  None. FINDINGS: CT HEAD FINDINGS Brain: No evidence of acute infarction, hemorrhage, hydrocephalus, extra-axial collection or mass lesion/mass effect. Calcification is noted in the midportion of the pons. Mild chronic white matter ischemic changes are noted. Vascular: No hyperdense vessel or unexpected calcification. Skull: Normal. Negative for fracture or focal lesion. Sinuses/Orbits: No acute finding. Other: Mild parietal scalp hematoma is noted in the vertex on the left. CT CERVICAL SPINE FINDINGS Alignment: Mild loss of normal cervical lordosis is noted likely related to muscular spasm Skull base and vertebrae: 7 cervical segments are well visualized. Vertebral body height is well maintained. Multilevel osteophytic changes and facet hypertrophic changes are noted. No acute fracture or acute facet abnormality is seen. Soft tissues and spinal canal: Surrounding soft tissue structures show vascular calcifications. Enlargement of the right lobe of the thyroid is noted with evidence of calcifications and a nodule which measures at least 2.5 cm. No other soft tissue abnormality is noted. Upper chest: Visualized lung apices demonstrate evidence of a right-sided pleural effusion. This is of uncertain chronicity. No rib abnormality is noted. Other: None IMPRESSION: CT of the head: Left parietal scalp hematoma. No acute intracranial abnormality is noted. CT of the cervical spine: Multilevel degenerative changes without acute bony abnormality. Right-sided pleural effusion. Prominent right lobe of the thyroid with at least 1 nodule within. Recommend nonemergent thyroid US (ref: J Am Coll Radiol. 2015 Feb;12(2): 143-50). Electronically Signed   By:  Inez Catalina M.D.   On: 07/31/2020 18:10   DG Abd Portable 1V  Result Date: 08/01/2020 CLINICAL DATA:  NG tube placement EXAM: PORTABLE ABDOMEN - 1 VIEW COMPARISON:  None FINDINGS: Partially visualized thoracic spinal hardware. Esophageal tube tip and side port overlie the mid stomach. Gas pattern unremarkable IMPRESSION: Esophageal tube tip and side port overlie the mid stomach. Electronically Signed   By: Donavan Foil M.D.   On: 08/01/2020 17:21   DG C-Arm 1-60 Min  Result Date: 08/01/2020 CLINICAL DATA:  T8 through 11 fusion. EXAM: THORACOLUMBAR SPINE 1V COMPARISON:  Preoperative imaging. FINDINGS: Two fluoroscopic spot views of the thoracic spine obtained in frontal and lateral projections. Pedicle rods in the lower thoracic spine, levels difficult to accurately delineate on coned view. Total fluoroscopy time 20 seconds. Total dose 7.78 mGy. IMPRESSION: Intraoperative fluoroscopy during thoracic fusion. Electronically Signed   By: Keith Rake M.D.   On: 08/01/2020 16:16   US THYROID  Result Date: 08/02/2020 CLINICAL DATA:  Thyroid enlargement EXAM: THYROID ULTRASOUND TECHNIQUE: Ultrasound examination of the thyroid gland and  adjacent soft tissues was performed. COMPARISON:  None. FINDINGS: Parenchymal Echotexture: Mildly heterogeneous Isthmus: 0.4 cm Right lobe: 6.7 x 4.0 x 4.3 cm Left lobe: 4.0 x 2.0 x 1.1 cm _________________________________________________________ Estimated total number of nodules >/= 1 cm: 3 Number of spongiform nodules >/=  2 cm not described below (TR1): 0 Number of mixed cystic and solid nodules >/= 1.5 cm not described below (TR2): 0 _________________________________________________________ Nodule # 1: Location: Right; mid Maximum size: 3.8 cm; Other 2 dimensions: 3.5 x 3.4 cm Composition: solid/almost completely solid (2) Echogenicity: isoechoic (1) Shape: not taller-than-wide (0) Margins: smooth (0) Echogenic foci: none (0) ACR TI-RADS total points: 3. ACR TI-RADS  risk category: TR3 (3 points). ACR TI-RADS recommendations: **Given size (>/= 2.5 cm) and appearance, fine needle aspiration of this mildly suspicious nodule should be considered based on TI-RADS criteria. _________________________________________________________ Nodule # 2: Location: Right; mid Maximum size: 2.1 cm; Other 2 dimensions: 1.5 x 1.7 cm Composition: solid/almost completely solid (2) Echogenicity: isoechoic (1) Shape: not taller-than-wide (0) Margins: smooth (0) Echogenic foci: none (0) ACR TI-RADS total points: 3. ACR TI-RADS risk category: TR3 (3 points). ACR TI-RADS recommendations: *Given size (>/= 1.5 - 2.4 cm) and appearance, a follow-up ultrasound in 1 year should be considered based on TI-RADS criteria. _________________________________________________________ Nodule # 3: Location: Right; inferior Maximum size: 2.7 cm; Other 2 dimensions: 2.5 x 2.5 cm Composition: solid/almost completely solid (2) Echogenicity: hypoechoic (2) Shape: not taller-than-wide (0) Margins: smooth (0) Echogenic foci: none (0) ACR TI-RADS total points: 4. ACR TI-RADS risk category: TR4 (4-6 points). ACR TI-RADS recommendations: **Given size (>/= 1.5 cm) and appearance, fine needle aspiration of this moderately suspicious nodule should be considered based on TI-RADS criteria. _________________________________________________________ IMPRESSION: 1. Nodule 1 (TI-RADS 3) and nodule 3 (TI-RADS 4) meets criteria for FNA. 2. Nodule 2 (TI-RADS 3) meets criteria for surveillance. Follow-up thyroid ultrasound should be performed in 1 year. The above is in keeping with the ACR TI-RADS recommendations - J Am Coll Radiol 2017;14:587-595. Electronically Signed   By: Miachel Roux M.D.   On: 08/02/2020 07:17    Scheduled Meds: . sodium chloride   Intravenous Once  . sodium chloride   Intravenous Once  . atenolol  100 mg Per Tube Daily  . cloNIDine  0.2 mg Per Tube BID  . insulin aspart  0-5 Units Subcutaneous QHS  . insulin  aspart  0-9 Units Subcutaneous TID WC  . senna-docusate  2 tablet Per Tube QHS   Continuous Infusions: . sodium chloride 1,000 mL (08/01/20 0656)     LOS: 1 day   Time spent: 30 minutes   Darliss Cheney, MD [facility]  08/02/2020, 11:45 AM   How to contact the [facility] Attending or Consulting provider [place] or covering provider during after hours [place], for this patient?  1. Check the care team in [facility] and look for a) attending/consulting TRH provider listed and b) the [facility] team listed. Page or secure chat [phone]. 2. Log into [email or web address] and use [place]'s universal password to access. If you do not have the password, please contact the hospital operator. 3. Locate the [facility] provider you are looking for under [facility] and page to a number that you can be directly reached. 4. If you still have difficulty reaching the provider, please page the [facility] (Director on Call) for the Hospitalists listed on amion for assistance.

## 2020-08-02 NOTE — TOC CAGE-AID Note (Signed)
Transition of Care [facility]) - CAGE-AID Screening   Patient Details  Name: Jacqueline Richmond MRN: [id number] Date of Birth: [birth year]    Bluford Main, RN 08/02/2020, 9:46 AM   Clinical Narrative:  Pt denies alcohol and drug use.  CAGE-AID Screening:    Have You Ever Felt You Ought to Cut Down on Your Drinking or Drug Use?: No Have People Annoyed You By Critizing Your Drinking Or Drug Use?: No Have You Felt Bad Or Guilty About Your Drinking Or Drug Use?: No Have You Ever Had a Drink or Used Drugs First Thing In The Morning to Steady Your Nerves or to Get Rid of a Hangover?: No CAGE-AID Score: 0  Substance Abuse Education Offered: No

## 2020-08-02 NOTE — Progress Notes (Signed)
STAT EEG started.  Notified Dr. Hortense Ramal.

## 2020-08-02 NOTE — Progress Notes (Signed)
Occupational Therapy Evaluation Patient Details Name: Jacqueline Richmond MRN: [id number] DOB: 04-09-1945 Today's Date: 08/02/2020    History of Present Illness 75 yo female [facility] on 5/10 s/p fall down front brink steps with + head trauma. Pt sustained T10 fx with extension into T9-10 disc space, R pleural effusion. CTH shows L parietal scalp hematoma with no acute intracranial abnormality noted. s/p s/p T8-11 stabilization for unstable T10 fracture on 5/11. PMH includes type 2 diabetes, essential hypertension, CKD 4,  HLD, cataracts, R THA 2004.   Clinical Impression   Jacqueline Richmond was evaluated for the above impairments. Pt was indep with ADL/IADLs PTA, lives in 2 level home with husband. Upon arrival, pt was sitting in chair with TLSO donned with plans to transfer back to bed to be transported to IR, RN present. Pt completed sit to stand with mod A, and completed stand pivot with rw mod A. Pt recalled log rolling technique with vc. Pt demonstrated good ability to log roll with Mod A for BLE management into bed. TLSO doffed while supine in bed, pt educated that she cannot elevate the HOB while TLSO is doffed, pt verbalized understanding. Brace left in bed with pt for transport to IR. Pt would benefit from continued OT services acutely to progress function in ADLs and mobility. Recommended to dc to CIR level of rehab to progress function towards independence.     Follow Up Recommendations  CIR;Supervision/Assistance - 24 hour    Equipment Recommendations  Tub/shower bench        Precautions / Restrictions Precautions Precautions: Fall;Back Precaution Comments: reviewed BLT rules, log roll in/out of bed Required Braces or Orthoses: Spinal Brace Spinal Brace: Thoracolumbosacral orthotic;Applied in sitting position Restrictions Weight Bearing Restrictions: No Other Position/Activity Restrictions: No bending, lifting, twisting or reaching over head      Mobility Bed Mobility Overal bed  mobility: Needs Assistance Bed Mobility: Rolling;Sit to Sidelying Rolling: Mod assist Sidelying to sit: Mod assist     Sit to sidelying: Mod assist General bed mobility comments: pt wit increased pain for bed mobility    Transfers Overall transfer level: Needs assistance Equipment used: Rolling walker (2 wheeled) Transfers: Sit to/from Stand Sit to Stand: Mod assist;From elevated surface         General transfer comment: mod A for boosting from sit>stand, balance and shifting weight in standing    Balance Overall balance assessment: History of Falls;Needs assistance Sitting-balance support: Feet supported;Bilateral upper extremity supported Sitting balance-Leahy Scale: Fair     Standing balance support: Bilateral upper extremity supported;During functional activity Standing balance-Leahy Scale: Poor Standing balance comment: reliant on external support               ADL either performed or assessed with clinical judgement   ADL Overall ADL's : Needs assistance/impaired Eating/Feeding: Independent;Sitting   Grooming: Set up;Sitting   Upper Body Bathing: Minimal assistance;Sitting   Lower Body Bathing: Moderate assistance;Sit to/from stand   Upper Body Dressing : Total assistance Upper Body Dressing Details (indicate cue type and reason): Total A for donning TLSO Lower Body Dressing: Maximal assistance;Sit to/from stand   Toilet Transfer: Moderate assistance;BSC;RW;Stand-pivot   Toileting- Clothing Manipulation and Hygiene: Sit to/from stand;Maximal assistance       Functional mobility during ADLs: Moderate assistance;Rolling walker General ADL Comments: seemingly confused this session, requries step by step verbal cues                   Pertinent Vitals/Pain Pain Assessment: Faces  Faces Pain Scale: Hurts whole lot Pain Location: back Pain Descriptors / Indicators: Sore;Discomfort;Grimacing Pain Intervention(s): Limited activity within patient's  tolerance;Monitored during session     Hand Dominance Right   Extremity/Trunk Assessment Upper Extremity Assessment Upper Extremity Assessment: Generalized weakness;Overall [facility] for tasks assessed   Lower Extremity Assessment Lower Extremity Assessment: Defer to PT evaluation   Cervical / Trunk Assessment Cervical / Trunk Assessment: Other exceptions Cervical / Trunk Exceptions: s/p back surgery   Communication Communication Communication: No difficulties   Cognition Arousal/Alertness: Awake/alert Behavior During Therapy: WFL for tasks assessed/performed Overall Cognitive Status: Impaired/Different from baseline Area of Impairment: Problem solving;Safety/judgement             Safety/Judgement: Decreased awareness of safety;Decreased awareness of deficits   Problem Solving: Requires verbal cues;Difficulty sequencing;Requires tactile cues;Slow processing General Comments: Pt states she has been "confused" since surgery, states she isn't remembering things well but not observed during session. Pt requiring step-by-step cues for transfer OOB to recliner, pt stopping and starting mobility multiple times requiring cues to continue.   General Comments  Pt on 2L O2 nasal canual             Home Living Family/patient expects to be discharged to:: Private residence Living Arrangements: Spouse/significant other Available Help at Discharge: Family;Available 24 hours/day Type of Home: House Home Access: Stairs to enter;Level entry Entrance Stairs-Number of Steps: level entry through garage, 3 steps to enter front door Entrance Stairs-Rails: None Home Layout: Two level;Able to live on main level with bedroom/bathroom     Bathroom Shower/Tub: Tub/shower unit   Bathroom Toilet: Handicapped height Bathroom Accessibility: Yes How Accessible: Accessible via walker Home Equipment: Foraker - 4 wheels;Bedside commode   Additional Comments: pt has plans to convert her tub shower  into a walk in shower      Prior Functioning/Environment Level of Independence: Independent                 OT Problem List: Decreased strength;Decreased range of motion;Decreased activity tolerance;Impaired balance (sitting and/or standing);Decreased safety awareness;Decreased knowledge of use of DME or AE;Decreased knowledge of precautions;Pain      OT Treatment/Interventions: Self-care/ADL training;Therapeutic exercise;DME and/or AE instruction;Therapeutic activities;Patient/family education;Balance training    OT Goals(Current goals can be found in the care plan section) Acute Rehab OT Goals Patient Stated Goal: decrease pain OT Goal Formulation: With patient Time For Goal Achievement: 08/16/20 Potential to Achieve Goals: Fair ADL Goals Pt Will Perform Upper Body Dressing: with min assist;sitting Pt Will Perform Lower Body Dressing: with min guard assist;with adaptive equipment;sit to/from stand Pt Will Transfer to Toilet: with min guard assist;ambulating Additional ADL Goal #1: Pt will indep recall all back precautions to ensure safe transition into the next venue of care  OT Frequency: Min 2X/week    AM-PAC OT "6 Clicks" Daily Activity     Outcome Measure Help from another person eating meals?: None Help from another person taking care of personal grooming?: A Little Help from another person toileting, which includes using toliet, bedpan, or urinal?: A Lot Help from another person bathing (including washing, rinsing, drying)?: A Lot Help from another person to put on and taking off regular upper body clothing?: Total Help from another person to put on and taking off regular lower body clothing?: A Lot 6 Click Score: 14   End of Session Equipment Utilized During Treatment: Gait belt;Back brace Nurse Communication: Mobility status;Precautions  Activity Tolerance: Patient limited by pain Patient left: in bed  OT Visit Diagnosis:  Unsteadiness on feet (R26.81);Other  abnormalities of gait and mobility (R26.89);Repeated falls (R29.6);Muscle weakness (generalized) (M62.81);History of falling (Z91.81);Pain Pain - Right/Left:  (back) Pain - part of body:  (back)                Time: 0998-3382 OT Time Calculation (min): 15 min Charges:  OT General Charges $OT Visit: 1 Visit OT Evaluation $OT Eval Moderate Complexity: 1 Mod    Jaymond Waage A Zyion Leidner 08/02/2020, 2:37 PM

## 2020-08-02 NOTE — Progress Notes (Addendum)
Inpatient Rehab Admissions Coordinator Note:   Per therapy recommendations, pt was screened for CIR candidacy by Shann Medal, PT, DPT.  At this time we are recommending a CIR consult and I will place an order per our protocol.  Please contact me with questions.   Addendum: note pt intubated.  Will follow from a distance for therapy progress once extubated.   Shann Medal, PT, DPT [phone] 08/02/20 2:55 PM

## 2020-08-02 NOTE — Progress Notes (Signed)
Patient was in IR to get her thoracentesis.  Patient suddenly became altered and rigid all 4 extremities.  Rapid response was called.  I was informed of the patient's situation.  By the time I arrived to the IR, patient was already being attended by IR, anesthesiology, rapid response team and so forth.  Patient was being bagged at that point in time.  On my examination, she had very sluggish pupils bilaterally.  Patient had very tonic all 4 extremities.  Suspicion was for possible seizure or intracranial hemorrhage.  Code stroke was called.  Dr. Renaldo Reel of neurology was called at the bedside who examined the patient as well.  He ordered Keppra.  Patient was subsequently intubated.  Stroke work-up will be done by neurology.  I have also called PCCM team and they will be assuming care of the patient.  I called patient's son Enrique Sack at [phone] and left a voicemail.  Waiting for callback.

## 2020-08-02 NOTE — Consult Note (Signed)
NAME:  [name], MRN:  [id number], DOB:  11/22/1945, LOS: 1 ADMISSION DATE:  07/31/2020, CONSULTATION DATE:  08/02/2020 REFERRING MD:  Dr. Doristine Bosworth, CHIEF COMPLAINT:  AMS with inability to protect airway  History of Present Illness:  Jacqueline Richmond is a 75 y.o. female with PMH significant for type II DM, HTN, HLD, CKD stage 3/4, arthritis, and allergy who presented after suffering ground level fall at home. Patient denied any LOC. Head CT was negative but CT chest revealed significant thoracic injury including displaced T10 fracture that extended into the T9-T10 disc space and anterior vertebral body.  Fracture extends through anterior osteophytes as well as the anterior superior aspect of T11 vertebral body. There is adjacent paravertebral hemorrhage and small volume of gas likely related to escape vacuum phenomenon.  Neurosurgery was consulted due to these findings. CT chst also revealed moderate to large right pleural effusion.   Patient was admitted per [facility] for further management.   NSG preformed thoracic spine stabilization from T8-T11 5/11.   Afternoon of 5/12 while in IR for thoracentesis patient became unresponsive with ridge extremities x4. Given inability to protect airway patient was emergently intubated by anaesthesia. Urgent stroke CT was negative for acute findings.  EEG pending. PCCM consulted for further management.   Pertinent  Medical History  Type II DM, HTN, HLD, CKD stage 4, arthritis, and allergy  Significant Hospital Events: Including procedures, antibiotic start and stop dates in addition to other pertinent events   . 5/11 admitted  . 5/12 worsening AMS with concern for seizures, intubated and transfer to ICU   Interim History / Subjective:  As above  Objective   Blood pressure (!) 107/56, pulse 90, temperature 98.5 F (36.9 C), temperature source Oral, resp. rate (!) 22, height 5\' 3"  (1.6 m), weight 84.4 kg, SpO2 100 %.    FiO2 (%):  [100 %] 100 %    Intake/Output Summary (Last 24 hours) at 08/02/2020 1525 Last data filed at 08/02/2020 1500 Gross per 24 hour  Intake 304.54 ml  Output 1250 ml  Net -945.46 ml   Filed Weights   07/31/20 1708  Weight: 84.4 kg    Examination: General: Acute on chronic ill appearing elderly /female lying in bed on mechanical ventilation, in NAD HEENT: ETT, MM pink/moist, PERRL,  Neuro: Sedated on vent  CV: s1s2 regular rate and rhythm, no murmur, rubs, or gallops,  PULM:  Clear to ascultation bilaterally, no increased work of breathing  GI: soft, bowel sounds active in all 4 quadrants, non-tender, non-distended Extremities: warm/dry, no edema  Skin: no rashes or lesions  Labs/imaging that I havepersonally reviewed   CTA head and neck 5/12  No large vessel occlusion, hemodynamically significant stenosis, or evidence of dissection.  Resolved Hospital Problem list     Assessment & Plan:  Altered mental status of unknown etiology Patient became acutely altered in IR while awaiting thoracentesis; unresponsive with respiratory failure and rigidity of all 4 extremities. RRT called, ventilated with LMA/BVM, subsequently intubated. Concern for stroke vs. Seizure vs. Other encephalopathy. Code stroke called. P: - Neurology/Stroke consulted, appreciate recommendations - CT Head/CT Angio negative for acute findings; no large-vessel occlusion; extensive chronic small-vessel ischemic changes - STAT EEG, LTM - Keppra/AEDs per Neuro - Neuroprotective measures: HOB > 30 degrees, normoglycemia, normothermia, keep electrolytes WNL  Acute hypoxic respiratory failure P: - Continue full vent support (4-8cc/kg IBW) - Wean FiO2 for O2 sat > 90% - Daily WUA/SBT - VAP bundle - Pulmonary hygiene -  PAD protocol for sedation: Propofol and Fentanyl for goal RASS 0 to -1  Bilateral pleural effusions Hemothorax s/p mechanical fall CT Chest 5/10 demonstrating moderate to large R pleural effusion, heterogenous,  ?hemorrhage vs. Collapsed lung. Presented to IR for thoracentesis 5/12; became altered (as above). P: - Hold off on thoracentesis for now - Proceed with thora once clinically stable - Obtain pleural studies as able  Mechanical fall with T10 vertebral fracture S/p T10-T11 stabilization 08/01/2020 P: - Neurosurgery following - TLSO brace per NSG  AKI on CKD stage IV Baseline Cr 1.4-1.6. P: - Trend BMP - Replete electrolytes as indicated - Monitor I&Os - Renally dose all medications - Avoid nephrotoxic agents as able  T2DM P: - Continue SSI, hold mealtime insulin given NPO status - CBGs Q6H  Hypertension P: - Hold Clonidine/Atenolol in the setting of relative hypotension with Propofol/sedation  Best practice   Diet:  NPO Pain/Anxiety/Delirium protocol (if indicated): Yes (RASS goal -1) VAP protocol (if indicated): Yes DVT prophylaxis: SCD GI prophylaxis: PPI Glucose control:  SSI Yes Central venous access:  N/A Arterial line:  N/A Foley:  Yes, and it is still needed Mobility:  bed rest  PT consulted: N/A Last date of multidisciplinary goals of care discussion Pending  Code Status:  full code Disposition: ICU  Labs   CBC: Recent Labs  Lab 07/31/20 1755 08/01/20 0559 08/01/20 1226 08/01/20 1336 08/01/20 1549 08/02/20 0603  WBC 13.5* 18.5*  --   --  15.1* 14.9*  HGB 11.2* 8.6* 7.8* 8.2* 10.4* 8.7*  HCT 35.1* 27.4* 23.0* 24.0* 31.4* 26.8*  MCV 86.9 89.8  --   --  86.5 85.1  PLT 231 214  --   --  163 096    Basic Metabolic Panel: Recent Labs  Lab 07/31/20 1755 08/01/20 0559 08/01/20 1226 08/01/20 1336 08/02/20 0603  NA 135 137 140 140 138  K 4.5 5.6* 4.5 4.9 4.8  CL 104 107 109  --  110  CO2 21* 22  --   --  20*  GLUCOSE 113* 194* 156*  --  141*  BUN 61* 68* 81*  --  67*  CREATININE 2.05* 2.68* 2.60*  --  2.42*  CALCIUM 9.4 8.7*  --   --  8.1*  MG  --  1.9  --   --   --   PHOS  --  6.4*  --   --   --    GFR: Estimated Creatinine Clearance:  21.3 mL/min (A) (by C-G formula based on SCr of 2.42 mg/dL (H)). Recent Labs  Lab 07/31/20 1755 08/01/20 0018 08/01/20 0559 08/01/20 1549 08/02/20 0603  WBC 13.5*  --  18.5* 15.1* 14.9*  LATICACIDVEN  --  1.7  --   --   --     Liver Function Tests: Recent Labs  Lab 08/01/20 0559  AST 21  ALT 15  ALKPHOS 86  BILITOT 0.7  PROT 6.4*  ALBUMIN 2.7*   No results for input(s): LIPASE, AMYLASE in the last 168 hours. No results for input(s): AMMONIA in the last 168 hours.  ABG    Component Value Date/Time   PHART 7.271 (L) 08/01/2020 1336   PCO2ART 40.6 08/01/2020 1336   PO2ART 73 (L) 08/01/2020 1336   HCO3 19.2 (L) 08/01/2020 1336   TCO2 20 (L) 08/01/2020 1336   ACIDBASEDEF 8.0 (H) 08/01/2020 1336   O2SAT 94.0 08/01/2020 1336     Coagulation Profile: No results for input(s): INR, PROTIME in the last 168  hours.  Cardiac Enzymes: No results for input(s): CKTOTAL, CKMB, CKMBINDEX, TROPONINI in the last 168 hours.  HbA1C: Hgb A1c MFr Bld  Date/Time Value Ref Range Status  08/01/2020 06:02 AM 5.4 4.8 - 5.6 % Final    Comment:    (NOTE) Pre diabetes:          5.7%-6.4%  Diabetes:              >6.4%  Glycemic control for   <7.0% adults with diabetes   08/29/2011 01:04 PM 7.0 (H) <5.7 % Final    Comment:    (NOTE)                                                                       According to the ADA Clinical Practice Recommendations for 2011, when HbA1c is used as a screening test:  >=6.5%   Diagnostic of Diabetes Mellitus           (if abnormal result is confirmed) 5.7-6.4%   Increased risk of developing Diabetes Mellitus References:Diagnosis and Classification of Diabetes Mellitus,Diabetes OINO,6767,20(NOBSJ 1):S62-S69 and Standards of Medical Care in         Diabetes - 2011,Diabetes GGEZ,6629,47 (Suppl 1):S11-S61.    CBG: Recent Labs  Lab 08/01/20 1509 08/01/20 1630 08/01/20 2126 08/02/20 0644 08/02/20 1116  GLUCAP 152* 151* 149* 139* 106*     Review of Systems:   Unable to assess   Past Medical History:  She,  has a past medical history of Allergy, Anemia, Arthritis, Cataract, CKD (chronic kidney disease) stage 3, GFR 30-59 ml/min ([place]) (08/29/2011), Colon polyp, Complication of anesthesia, Difficult intubation, Diverticulosis, Gout, HTN (hypertension) (08/29/2011), Hyperlipemia, Hypertension, Psoriasis, and Type II or unspecified type diabetes mellitus without mention of complication, not stated as uncontrolled (08/29/2011).   Surgical History:   Past Surgical History:  Procedure Laterality Date  . BREAST LUMPECTOMY WITH NEEDLE LOCALIZATION Right 05/07/2012   Procedure: BREAST LUMPECTOMY WITH NEEDLE LOCALIZATION;  Surgeon: Merrie Roof, MD;  Location: [place];  Service: General;  Laterality: Right;  . CHOLECYSTECTOMY    . COLONOSCOPY  2009   Ardis Hughs   . COLONOSCOPY WITH PROPOFOL N/A 05/20/2018   Procedure: COLONOSCOPY WITH PROPOFOL;  Surgeon: Milus Banister, MD;  Location: [facility];  Service: Endoscopy;  Laterality: N/A;  . HERNIA REPAIR    . HIP SURGERY    . LAMINECTOMY WITH POSTERIOR LATERAL ARTHRODESIS LEVEL 4 N/A 08/01/2020   Procedure: LAMINECTOMY WITH POSTERIOR LATERAL ARTHRODESIS Thoracic nine- Thoracic eleven ;  Surgeon: Consuella Lose, MD;  Location: [place];  Service: Neurosurgery;  Laterality: N/A;  . POLYPECTOMY  05/20/2018   Procedure: POLYPECTOMY;  Surgeon: Milus Banister, MD;  Location: [facility];  Service: Endoscopy;;  . RHINOPLASTY    . SALPINGOOPHORECTOMY    . TOTAL HIP ARTHROPLASTY Right 2004     Social History:   reports that she has never smoked. She has never used smokeless tobacco. She reports current alcohol use. She reports that she does not use drugs.   Family History:  Her family history includes Arthritis in her sister; Heart disease in her father. There is no history of Colon cancer, Colon polyps, Esophageal cancer, Stomach cancer, or Rectal cancer.   Allergies Allergies  Allergen  Reactions  . Codeine Itching  . Iodinated Diagnostic Agents     Other reaction(s): Unknown  . Pentazocine Other (See Comments)    Hallucinations  . Statins      Home Medications  Prior to Admission medications   Medication Sig Start Date End Date Taking? Authorizing Provider  allopurinol (ZYLOPRIM) 300 MG tablet Take 1 tablet (300 mg total) by mouth daily. 06/07/19  Yes Lyndal Pulley, DO  aspirin EC 81 MG tablet Take 81 mg by mouth daily. Reported on 06/19/2015   Yes [provider]  atenolol (TENORMIN) 100 MG tablet Take 100 mg by mouth daily.   Yes [provider]  b complex vitamins tablet Take 1 tablet by mouth daily.   Yes [provider]  CINNAMON PO Take 2,000 mg by mouth 2 (two) times daily.   Yes [provider]  cloNIDine (CATAPRES) 0.3 MG tablet Take 0.3 mg by mouth 2 (two) times daily.   Yes [provider]  furosemide (LASIX) 40 MG tablet Take 40 mg by mouth daily.    Yes [provider]  Garlic 1027 MG CAPS Take 2,000 mg by mouth 2 (two) times daily.   Yes [provider]  glipiZIDE (GLUCOTROL XL) 10 MG 24 hr tablet Take 10 mg by mouth daily with breakfast.   Yes [provider]  KERENDIA 10 MG TABS Take 10 mg by mouth daily. 07/23/20  Yes [provider]  loratadine (CLARITIN) 10 MG tablet Take 10 mg by mouth daily.   Yes [provider]  niacin 500 MG tablet Take 500 mg by mouth 2 (two) times daily with a meal. Reported on 06/19/2015   Yes [provider]  Omega-3 1000 MG CAPS Take 1,000 mg by mouth 2 (two) times daily.   Yes [provider]  sitaGLIPtin (JANUVIA) 50 MG tablet Take 50 mg by mouth daily.    Yes [provider]  doxycycline (VIBRA-TABS) 100 MG tablet Take 1 tablet (100 mg total) by mouth 2 (two) times daily. Patient not taking: No sig reported 06/09/19   Lyndal Pulley, DO  fenofibrate 54 MG tablet Take 1 tablet (54 mg total) by mouth at  bedtime. Patient not taking: Reported on 08/01/2020 09/02/11 05/13/19  Thurnell Lose, MD     Critical care time:    Performed by: Johnsie Cancel  Total critical care time: 45 minutes  Critical care time was exclusive of separately billable procedures and treating other patients.  Critical care was necessary to treat or prevent imminent or life-threatening deterioration.  Critical care was time spent personally by me on the following activities: development of treatment plan with patient and/or surrogate as well as nursing, discussions with consultants, evaluation of patient's response to treatment, examination of patient, obtaining history from patient or surrogate, ordering and performing treatments and interventions, ordering and review of laboratory studies, ordering and review of radiographic studies, pulse oximetry and re-evaluation of patient's condition.  Johnsie Cancel, NP-C  Pulmonary & Critical Care Personal contact information can be found on Amion  08/02/2020, 4:07 PM

## 2020-08-02 NOTE — Progress Notes (Signed)
Progress Note  1 Day Post-Op  Subjective: CC: looks and feels much better from yesterday. She has had no further nausea or emesis and abdominal pain is resolved. She is passing flatus. She is on supplemental O2 but denies subjective shortness of breath. She has had an intermittent cough since prior to admission. Pain is currently controlled.   Objective: Vital signs in last 24 hours: Temp:  [97 F (36.1 C)-98.5 F (36.9 C)] 98.5 F (36.9 C) (05/12 0801) Pulse Rate:  [84-92] 89 (05/12 0801) Resp:  [13-20] 16 (05/12 0801) BP: (119-154)/(42-74) 131/54 (05/12 0801) SpO2:  [92 %-100 %] 95 % (05/12 0801) Arterial Line BP: (174-184)/(57-79) 177/57 (05/11 1555)    Intake/Output from previous day: 05/11 0701 - 05/12 0700 In: 1875 [I.V.:1000; Blood:625; IV Piggyback:250] Out: 850 [Urine:650; Blood:200] Intake/Output this shift: No intake/output data recorded.  PE: General: pleasant, WD, female who is laying in bed in NAD HEENT: pupils equal and round. EOMs intact Heart: regular, rate, and rhythm. Palpable radial and pedal pulses bilaterally Lungs: CTAB, no wheezes, rhonchi, or rales noted.  Respiratory effort nonlabored Abd: soft, NT, ND, +BS, well healed midline surgical scar MS: bilateral calves nontender to palpation. Left wrist swollen without ecchymosis, laceration, erythema or rash. No tenderness to palpation Skin: warm and dry  Psych: A&Ox3 with an appropriate affect.    Lab Results:  Recent Labs    08/01/20 1549 08/02/20 0603  WBC 15.1* 14.9*  HGB 10.4* 8.7*  HCT 31.4* 26.8*  PLT 163 160   BMET Recent Labs    08/01/20 0559 08/01/20 1226 08/01/20 1336 08/02/20 0603  NA 137 140 140 138  K 5.6* 4.5 4.9 4.8  CL 107 109  --  110  CO2 22  --   --  20*  GLUCOSE 194* 156*  --  141*  BUN 68* 81*  --  67*  CREATININE 2.68* 2.60*  --  2.42*  CALCIUM 8.7*  --   --  8.1*   PT/INR No results for input(s): LABPROT, INR in the last 72 hours. CMP     Component  Value Date/Time   NA 138 08/02/2020 0603   K 4.8 08/02/2020 0603   CL 110 08/02/2020 0603   CO2 20 (L) 08/02/2020 0603   GLUCOSE 141 (H) 08/02/2020 0603   BUN 67 (H) 08/02/2020 0603   CREATININE 2.42 (H) 08/02/2020 0603   CALCIUM 8.1 (L) 08/02/2020 0603   PROT 6.4 (L) 08/01/2020 0559   ALBUMIN 2.7 (L) 08/01/2020 0559   AST 21 08/01/2020 0559   ALT 15 08/01/2020 0559   ALKPHOS 86 08/01/2020 0559   BILITOT 0.7 08/01/2020 0559   GFRNONAA 21 (L) 08/02/2020 0603   GFRAA 31 (L) 05/03/2012 1434   Lipase     Component Value Date/Time   LIPASE 75 (H) 09/01/2011 0455       Studies/Results: CT ABDOMEN PELVIS WO CONTRAST  Result Date: 08/02/2020 CLINICAL DATA:  Nausea and vomiting. EXAM: CT ABDOMEN AND PELVIS WITHOUT CONTRAST TECHNIQUE: Multidetector CT imaging of the abdomen and pelvis was performed following the standard protocol without IV contrast. COMPARISON:  X-ray abdomen 08/01/2020 FINDINGS: Lower chest: Interval increase in size of a heterogeneous moderate to large volume right pleural effusion. Associated complete collapse of the right lower lobe. Trace left pleural effusion. Hepatobiliary: No focal liver abnormality is seen. Status post cholecystectomy. No biliary dilatation. Pancreas: No focal lesion. Normal pancreatic contour. No surrounding inflammatory changes. No main pancreatic ductal dilatation. Spleen: Normal in size without  focal abnormality. Adrenals/Urinary Tract: No adrenal nodule bilaterally. Nonspecific bilateral perinephric stranding. Bilateral renal calcifications measuring up to 5 mm. No hydronephrosis. Exophytic 1.7 cm fluid density lesion within the right kidney likely represents a simple renal cyst. Otherwise no contour-deforming renal mass. No ureterolithiasis or hydroureter. Limited evaluation of distal ureters due to streak artifact originating from the right femoral surgical hardware. The urinary bladder is decompressed with a Foley catheter terminating within its  lumen. There is limited evaluation of the urinary bladder due to streak artifact originating from the right surgical femoral hardware. Stomach/Bowel: Enteric tube coursing within the gastric lumen with side port just distal to the gastroesophageal junction. Stomach is within normal limits. No evidence of bowel wall thickening or dilatation. Scattered colonic diverticulosis. Appendix appears normal. Vascular/Lymphatic: No abdominal aorta or iliac aneurysm. Moderate atherosclerotic plaque of the aorta and its branches. No abdominal, pelvic, or inguinal lymphadenopathy. Reproductive: Uterus and bilateral adnexa are unremarkable. Other: No intraperitoneal free fluid. No intraperitoneal free gas. No organized fluid collection. Musculoskeletal: Anterior abdominal hernia repair with mesh. Interval placement of partially visualized posterolateral thoracic fusion surgical hardware in a patient with T10-T11 acute vertebral body fracture with extension to the posterior elements. Slight angulation of the left T11 (3:12) surgical hardware with no violation of the spinal canal or findings to suggest impingement on the nerve roots. Overlying skin staples and subcutaneus soft tissue edema. Multilevel severe degenerative changes of the spine with fusion of the L3 through L5 vertebral bodies leading to straightening of the normal lumbar lordosis. Mild retrolisthesis of L5 on S1. No suspicious lytic or blastic osseous lesions. Total right hip arthroplasty.  Degenerative changes of the left hip. IMPRESSION: 1. Interval increase in size of a heterogeneous moderate to large volume right pleural effusion. Areas of increased density within the pleural fluid may represent hemothorax, collapsed right lower lobe lung, or less likely a mass lesion. 2. Interval development of trace left pleural effusion. 3. Nonspecific bilateral perinephric stranding. Consider correlation with urinalysis for infection. 4. Bilateral nonobstructive  nephrolithiasis measuring up to 5 mm. 5. Scattered colonic diverticulosis with no acute diverticulitis. 6. Known T10-T11 fracture with interval placement of partially visualized posterolateral thoracic fusion surgical hardware. 7. Severe degenerative changes of the lumbar spine with L3 through L5 vertebral body fusion. 8.  Aortic Atherosclerosis (ICD10-I70.0). Electronically Signed   By: Iven Finn M.D.   On: 08/02/2020 00:09   DG Ribs Bilateral W/Chest  Result Date: 07/31/2020 CLINICAL DATA:  Fall.  Pain. EXAM: BILATERAL RIBS AND CHEST - 4+ VIEW COMPARISON:  Remote chest radiograph 05/03/2012. No interval chest imaging. FINDINGS: No fracture or other bone lesions are seen involving the ribs. Right pleural effusion at least moderate in size. No pneumothorax. Heart size and mediastinal contours are within normal limits. Aortic atherosclerosis. IMPRESSION: 1. No evidence of rib fracture. 2. Right pleural effusion at least moderate in size. This is of unknown acuity. Electronically Signed   By: Keith Rake M.D.   On: 07/31/2020 20:39   DG Thoracic Spine 2 View  Result Date: 07/31/2020 CLINICAL DATA:  Unwitnessed fall.  Pain. EXAM: THORACIC SPINE 2 VIEWS COMPARISON:  None. FINDINGS: Broad-based dextroscoliotic curvature. No evidence of acute fracture or compression deformity. Flowing anterior osteophytes throughout the thoracic spine. Minimal disc space narrowing at T10-T11 and T11-T12. no paravertebral soft tissue abnormality IMPRESSION: 1. No radiographic evidence of acute fracture of the thoracic spine. 2. Scoliosis and degenerative change. Electronically Signed   By: Keith Rake M.D.   On:  07/31/2020 20:36   DG THORACOLUMABAR SPINE  Result Date: 08/01/2020 CLINICAL DATA:  T8 through 11 fusion. EXAM: THORACOLUMBAR SPINE 1V COMPARISON:  Preoperative imaging. FINDINGS: Two fluoroscopic spot views of the thoracic spine obtained in frontal and lateral projections. Pedicle rods in the lower  thoracic spine, levels difficult to accurately delineate on coned view. Total fluoroscopy time 20 seconds. Total dose 7.78 mGy. IMPRESSION: Intraoperative fluoroscopy during thoracic fusion. Electronically Signed   By: Keith Rake M.D.   On: 08/01/2020 16:16   DG Lumbar Spine Complete  Result Date: 07/31/2020 CLINICAL DATA:  Unwitnessed fall.  Pain. EXAM: LUMBAR SPINE - COMPLETE 4+ VIEW COMPARISON:  None. FINDINGS: Five non-rib-bearing lumbar vertebra. Straightening of normal lordosis. Near complete disc space loss at L4-L5 with prominent disc space narrowing at L3-L4 and L5-S1. Endplate spurring with lesser disc space narrowing at L1-L2 and L2-L3. Multilevel facet hypertrophy. Vertebral body heights are preserved. No evidence of fracture. Degenerative change of both sacroiliac joints. IMPRESSION: 1. No fracture of the lumbar spine. 2. Straightening of normal lordosis may be positioning or muscle spasm. Multilevel degenerative disc disease and facet hypertrophy. Electronically Signed   By: Keith Rake M.D.   On: 07/31/2020 20:37   DG Forearm Left  Result Date: 08/01/2020 CLINICAL DATA:  Fall yesterday with swelling. EXAM: LEFT FOREARM - 2 VIEW COMPARISON:  None. FINDINGS: Advanced degenerative changes involving the carpal bones and radiocarpal articulation. There is also more mild to moderate degenerative change about the elbow. No acute fracture or dislocation. No elbow joint effusion. Suboptimal patient positioning secondary to clinical status. Possible soft tissue swelling adjacent the proximal ulna. IMPRESSION: Advanced degenerative change, without acute osseous abnormality. Electronically Signed   By: Abigail Miyamoto M.D.   On: 08/01/2020 13:48   CT Head Wo Contrast  Result Date: 07/31/2020 CLINICAL DATA:  Recent fall from chair, initial encounter EXAM: CT HEAD WITHOUT CONTRAST CT CERVICAL SPINE WITHOUT CONTRAST TECHNIQUE: Multidetector CT imaging of the head and cervical spine was performed  following the standard protocol without intravenous contrast. Multiplanar CT image reconstructions of the cervical spine were also generated. COMPARISON:  None. FINDINGS: CT HEAD FINDINGS Brain: No evidence of acute infarction, hemorrhage, hydrocephalus, extra-axial collection or mass lesion/mass effect. Calcification is noted in the midportion of the pons. Mild chronic white matter ischemic changes are noted. Vascular: No hyperdense vessel or unexpected calcification. Skull: Normal. Negative for fracture or focal lesion. Sinuses/Orbits: No acute finding. Other: Mild parietal scalp hematoma is noted in the vertex on the left. CT CERVICAL SPINE FINDINGS Alignment: Mild loss of normal cervical lordosis is noted likely related to muscular spasm Skull base and vertebrae: 7 cervical segments are well visualized. Vertebral body height is well maintained. Multilevel osteophytic changes and facet hypertrophic changes are noted. No acute fracture or acute facet abnormality is seen. Soft tissues and spinal canal: Surrounding soft tissue structures show vascular calcifications. Enlargement of the right lobe of the thyroid is noted with evidence of calcifications and a nodule which measures at least 2.5 cm. No other soft tissue abnormality is noted. Upper chest: Visualized lung apices demonstrate evidence of a right-sided pleural effusion. This is of uncertain chronicity. No rib abnormality is noted. Other: None IMPRESSION: CT of the head: Left parietal scalp hematoma. No acute intracranial abnormality is noted. CT of the cervical spine: Multilevel degenerative changes without acute bony abnormality. Right-sided pleural effusion. Prominent right lobe of the thyroid with at least 1 nodule within. Recommend nonemergent thyroid US (ref: J Am Coll Radiol.  2015 Feb;12(2): 143-50). Electronically Signed   By: Inez Catalina M.D.   On: 07/31/2020 18:10   CT Chest Wo Contrast  Result Date: 07/31/2020 CLINICAL DATA:  Fall.  Chest  trauma. EXAM: CT CHEST WITHOUT CONTRAST TECHNIQUE: Multidetector CT imaging of the chest was performed following the standard protocol without IV contrast. COMPARISON:  Ribbon thoracic spine radiographs earlier today. FINDINGS: Cardiovascular: Moderate aortic atherosclerosis. No periaortic stranding to suggest injury. There are coronary artery calcifications. Heart is normal in size, slightly displaced into the left chest. No pericardial effusion. Mediastinum/Nodes: Scattered small mediastinal lymph nodes, not enlarged by size criteria. No esophageal wall thickening. Heterogeneously enlarged right lobe of the thyroid gland with scattered curvilinear calcifications. Included left thyroid lobe is normal. Lungs/Pleura: Moderate to large right pleural effusion, heterogeneous density. Areas of increased density within the pleural fluid may represent hemorrhage, collapsed lung or less likely masses. Small amount of pleural fluid appears loculated and tracks anteromedially. No pneumothorax. Left lung is clear. No left pleural effusion. Upper Abdomen: No definite free fluid in the upper abdomen. Diverticulosis at the splenic flexure of the colon. Cholecystectomy. Musculoskeletal: Bones are diffusely under mineralized. Diffuse thoracic ankylosis. Displaced fracture through T10 vertebral body involves the anterior third, extend into the T9-T10 disc space, and is displaced anteriorly. Fracture extends through anterior osteophytes as well as the anterior superior aspect of T11 vertebral body. No convincing extension to involve the posterior elements. There is adjacent paravertebral hemorrhage and small volume of gas, likely related to escaped vacuum phenomenon. No additional thoracic spine fracture. Diffuse ankylosis with flowing anterior osteophytes. No acute fracture of the ribs, included clavicles or shoulder girdles. No sternal fracture. IMPRESSION: 1. Extension injury of the thoracic spine with displaced T10 vertebral body  fracture extending into the T9-T10 disc space and anterior vertebral body. Fracture extends through anterior osteophytes as well as the anterior superior aspect of T11 vertebral body. No convincing extension to involve the posterior elements. There is adjacent paravertebral hemorrhage and small volume of gas, likely related to escaped vacuum phenomenon. Recommend spine consultation. 2. Moderate to large right pleural effusion, heterogeneous density. Areas of increased density within the pleural fluid may represent hemorrhage, collapsed lung or less likely masses. Recommend continued radiographic follow-up to ensure resolution. No pneumothorax or rib fractures. 3. Heterogeneously enlarged right lobe of the thyroid gland with scattered curvilinear calcifications. Recommend thyroid US (ref: J Am Coll Radiol. 2015 Feb;12(2): 143-50). Aortic Atherosclerosis (ICD10-I70.0). Electronically Signed   By: Keith Rake M.D.   On: 07/31/2020 22:29   CT Cervical Spine Wo Contrast  Result Date: 07/31/2020 CLINICAL DATA:  Recent fall from chair, initial encounter EXAM: CT HEAD WITHOUT CONTRAST CT CERVICAL SPINE WITHOUT CONTRAST TECHNIQUE: Multidetector CT imaging of the head and cervical spine was performed following the standard protocol without intravenous contrast. Multiplanar CT image reconstructions of the cervical spine were also generated. COMPARISON:  None. FINDINGS: CT HEAD FINDINGS Brain: No evidence of acute infarction, hemorrhage, hydrocephalus, extra-axial collection or mass lesion/mass effect. Calcification is noted in the midportion of the pons. Mild chronic white matter ischemic changes are noted. Vascular: No hyperdense vessel or unexpected calcification. Skull: Normal. Negative for fracture or focal lesion. Sinuses/Orbits: No acute finding. Other: Mild parietal scalp hematoma is noted in the vertex on the left. CT CERVICAL SPINE FINDINGS Alignment: Mild loss of normal cervical lordosis is noted likely  related to muscular spasm Skull base and vertebrae: 7 cervical segments are well visualized. Vertebral body height is well maintained. Multilevel osteophytic  changes and facet hypertrophic changes are noted. No acute fracture or acute facet abnormality is seen. Soft tissues and spinal canal: Surrounding soft tissue structures show vascular calcifications. Enlargement of the right lobe of the thyroid is noted with evidence of calcifications and a nodule which measures at least 2.5 cm. No other soft tissue abnormality is noted. Upper chest: Visualized lung apices demonstrate evidence of a right-sided pleural effusion. This is of uncertain chronicity. No rib abnormality is noted. Other: None IMPRESSION: CT of the head: Left parietal scalp hematoma. No acute intracranial abnormality is noted. CT of the cervical spine: Multilevel degenerative changes without acute bony abnormality. Right-sided pleural effusion. Prominent right lobe of the thyroid with at least 1 nodule within. Recommend nonemergent thyroid US (ref: J Am Coll Radiol. 2015 Feb;12(2): 143-50). Electronically Signed   By: Inez Catalina M.D.   On: 07/31/2020 18:10   DG Abd Portable 1V  Result Date: 08/01/2020 CLINICAL DATA:  NG tube placement EXAM: PORTABLE ABDOMEN - 1 VIEW COMPARISON:  None FINDINGS: Partially visualized thoracic spinal hardware. Esophageal tube tip and side port overlie the mid stomach. Gas pattern unremarkable IMPRESSION: Esophageal tube tip and side port overlie the mid stomach. Electronically Signed   By: Donavan Foil M.D.   On: 08/01/2020 17:21   DG C-Arm 1-60 Min  Result Date: 08/01/2020 CLINICAL DATA:  T8 through 11 fusion. EXAM: THORACOLUMBAR SPINE 1V COMPARISON:  Preoperative imaging. FINDINGS: Two fluoroscopic spot views of the thoracic spine obtained in frontal and lateral projections. Pedicle rods in the lower thoracic spine, levels difficult to accurately delineate on coned view. Total fluoroscopy time 20 seconds. Total  dose 7.78 mGy. IMPRESSION: Intraoperative fluoroscopy during thoracic fusion. Electronically Signed   By: Keith Rake M.D.   On: 08/01/2020 16:16   US THYROID  Result Date: 08/02/2020 CLINICAL DATA:  Thyroid enlargement EXAM: THYROID ULTRASOUND TECHNIQUE: Ultrasound examination of the thyroid gland and adjacent soft tissues was performed. COMPARISON:  None. FINDINGS: Parenchymal Echotexture: Mildly heterogeneous Isthmus: 0.4 cm Right lobe: 6.7 x 4.0 x 4.3 cm Left lobe: 4.0 x 2.0 x 1.1 cm _________________________________________________________ Estimated total number of nodules >/= 1 cm: 3 Number of spongiform nodules >/=  2 cm not described below (TR1): 0 Number of mixed cystic and solid nodules >/= 1.5 cm not described below (TR2): 0 _________________________________________________________ Nodule # 1: Location: Right; mid Maximum size: 3.8 cm; Other 2 dimensions: 3.5 x 3.4 cm Composition: solid/almost completely solid (2) Echogenicity: isoechoic (1) Shape: not taller-than-wide (0) Margins: smooth (0) Echogenic foci: none (0) ACR TI-RADS total points: 3. ACR TI-RADS risk category: TR3 (3 points). ACR TI-RADS recommendations: **Given size (>/= 2.5 cm) and appearance, fine needle aspiration of this mildly suspicious nodule should be considered based on TI-RADS criteria. _________________________________________________________ Nodule # 2: Location: Right; mid Maximum size: 2.1 cm; Other 2 dimensions: 1.5 x 1.7 cm Composition: solid/almost completely solid (2) Echogenicity: isoechoic (1) Shape: not taller-than-wide (0) Margins: smooth (0) Echogenic foci: none (0) ACR TI-RADS total points: 3. ACR TI-RADS risk category: TR3 (3 points). ACR TI-RADS recommendations: *Given size (>/= 1.5 - 2.4 cm) and appearance, a follow-up ultrasound in 1 year should be considered based on TI-RADS criteria. _________________________________________________________ Nodule # 3: Location: Right; inferior Maximum size: 2.7 cm;  Other 2 dimensions: 2.5 x 2.5 cm Composition: solid/almost completely solid (2) Echogenicity: hypoechoic (2) Shape: not taller-than-wide (0) Margins: smooth (0) Echogenic foci: none (0) ACR TI-RADS total points: 4. ACR TI-RADS risk category: TR4 (4-6 points). ACR TI-RADS recommendations: **Given  size (>/= 1.5 cm) and appearance, fine needle aspiration of this moderately suspicious nodule should be considered based on TI-RADS criteria. _________________________________________________________ IMPRESSION: 1. Nodule 1 (TI-RADS 3) and nodule 3 (TI-RADS 4) meets criteria for FNA. 2. Nodule 2 (TI-RADS 3) meets criteria for surveillance. Follow-up thyroid ultrasound should be performed in 1 year. The above is in keeping with the ACR TI-RADS recommendations - J Am Coll Radiol 2017;14:587-595. Electronically Signed   By: Miachel Roux M.D.   On: 08/02/2020 07:17    Anti-infectives: Anti-infectives (From admission, onward)   None       Assessment/Plan  Mechanical fall  T10 fracture with extension into T9-10 disc space in setting of diffuse bridging osteophytes - S/p T8-T11 fusion for T10 fracture OR 5/11 with Dr. Kathyrn Sheriff. 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 Contusion to elbow. Forearm xray without acute abnormality  FEN: CLD, advance as tolerated to carb mod ID: perioperative [email or web address]   LOS: 1 day    Winferd Humphrey, [facility] 08/02/2020, 10:56 AM Please see Amion for pager number during day hours 7:00am-4:30pm

## 2020-08-02 NOTE — Progress Notes (Signed)
This nurse secure chatted with Dr. Doristine Bosworth in regards to patient foley and not having an order. Dr. Quintella Baton to leave foley in at this time.

## 2020-08-02 NOTE — Progress Notes (Signed)
Back from CT

## 2020-08-02 NOTE — Progress Notes (Signed)
MD, please contact patient's family, they have questions regarding her surgery yesterday.

## 2020-08-02 NOTE — Consult Note (Signed)
NEUROLOGY CONSULTATION NOTE   Date of service: Aug 02, 2020 Patient Name: Jacqueline Richmond MRN:  [id number] DOB:  1945/12/14 Reason for consult: "Sudden Unresponsiveness" Requesting Provider: Darliss Cheney, MD _ _ _   _ __   _ __ _ _  __ __   _ __   __ _  History of Present Illness  Jacqueline Richmond is a 75 y.o. female with PMH significant for CKd, HTN, Gout, HTN, DM2, HLd who is admitted with T spine fx s/p fall. She was in IR for thoracentesis when she was awake, alert and talking an signed the convset form herself at 1259. About 10 mins later, she was found to be unresponsive in the bed in the waiting area. Thoracentesis was delayed and Rapid response called. Neurology consulted and I immediately rushed to see her. Her vitals with sat in 96% on LMA, HR in 130s, SBP of 160s/ 140s. She was noted to have stiffening of BL upper extremities and starring off in the space with her RUE up and off the bed.    ROS   Unable to obtain due to encephalopathy.  Past History   Past Medical History:  Diagnosis Date  . Allergy   . Anemia   . Arthritis   . Cataract    biltaerally   . CKD (chronic kidney disease) stage 3, GFR 30-59 ml/min (HCC) 08/29/2011   per patient, kidneys monitored by Dr Posey Pronto endocrinologist ,lov was  05-18-2018, "kidney fx stable"  . Colon polyp   . Complication of anesthesia   . Difficult intubation    small mouth opening  . Diverticulosis   . Gout   . HTN (hypertension) 08/29/2011  . Hyperlipemia   . Hypertension   . Psoriasis   . Type II or unspecified type diabetes mellitus without mention of complication, not stated as uncontrolled 08/29/2011   Past Surgical History:  Procedure Laterality Date  . BREAST LUMPECTOMY WITH NEEDLE LOCALIZATION Right 05/07/2012   Procedure: BREAST LUMPECTOMY WITH NEEDLE LOCALIZATION;  Surgeon: Merrie Roof, MD;  Location: [place];  Service: General;  Laterality: Right;  . CHOLECYSTECTOMY    . COLONOSCOPY  2009   Ardis Hughs   . COLONOSCOPY WITH  PROPOFOL N/A 05/20/2018   Procedure: COLONOSCOPY WITH PROPOFOL;  Surgeon: Milus Banister, MD;  Location: [facility];  Service: Endoscopy;  Laterality: N/A;  . HERNIA REPAIR    . HIP SURGERY    . LAMINECTOMY WITH POSTERIOR LATERAL ARTHRODESIS LEVEL 4 N/A 08/01/2020   Procedure: LAMINECTOMY WITH POSTERIOR LATERAL ARTHRODESIS Thoracic nine- Thoracic eleven ;  Surgeon: Consuella Lose, MD;  Location: [place];  Service: Neurosurgery;  Laterality: N/A;  . POLYPECTOMY  05/20/2018   Procedure: POLYPECTOMY;  Surgeon: Milus Banister, MD;  Location: [facility];  Service: Endoscopy;;  . RHINOPLASTY    . SALPINGOOPHORECTOMY    . TOTAL HIP ARTHROPLASTY Right 2004   Family History  Problem Relation Age of Onset  . Heart disease Father   . Arthritis Sister   . Colon cancer Neg Hx   . Colon polyps Neg Hx   . Esophageal cancer Neg Hx   . Stomach cancer Neg Hx   . Rectal cancer Neg Hx    Social History   Socioeconomic History  . Marital status: Married    Spouse name: Not on file  . Number of children: Not on file  . Years of education: Not on file  . Highest education level: Not on file  Occupational History  .  Not on file  Tobacco Use  . Smoking status: Never Smoker  . Smokeless tobacco: Never Used  Vaping Use  . Vaping Use: Never used  Substance and Sexual Activity  . Alcohol use: Yes    Comment: Drinks on Special Occasions  . Drug use: No  . Sexual activity: Never  Other Topics Concern  . Not on file  Social History Narrative  . Not on file   Social Determinants of Health   Financial Resource Strain: Not on file  Food Insecurity: Not on file  Transportation Needs: Not on file  Physical Activity: Not on file  Stress: Not on file  Social Connections: Not on file   Allergies  Allergen Reactions  . Codeine Itching  . Iodinated Diagnostic Agents     Other reaction(s): Unknown  . Pentazocine Other (See Comments)    Hallucinations  . Statins     Medications    Medications Prior to Admission  Medication Sig Dispense Refill Last Dose  . allopurinol (ZYLOPRIM) 300 MG tablet Take 1 tablet (300 mg total) by mouth daily. 90 tablet 1 07/31/2020 at Unknown time  . aspirin EC 81 MG tablet Take 81 mg by mouth daily. Reported on 06/19/2015   07/31/2020 at Unknown time  . atenolol (TENORMIN) 100 MG tablet Take 100 mg by mouth daily.   07/31/2020 at 8 am  . b complex vitamins tablet Take 1 tablet by mouth daily.   07/31/2020 at Unknown time  . CINNAMON PO Take 2,000 mg by mouth 2 (two) times daily.   07/31/2020 at Unknown time  . cloNIDine (CATAPRES) 0.3 MG tablet Take 0.3 mg by mouth 2 (two) times daily.   07/31/2020 at Unknown time  . furosemide (LASIX) 40 MG tablet Take 40 mg by mouth daily.    07/31/2020 at Unknown time  . Garlic 1696 MG CAPS Take 2,000 mg by mouth 2 (two) times daily.   07/31/2020 at Unknown time  . glipiZIDE (GLUCOTROL XL) 10 MG 24 hr tablet Take 10 mg by mouth daily with breakfast.   07/31/2020 at Unknown time  . KERENDIA 10 MG TABS Take 10 mg by mouth daily.   07/31/2020 at Unknown time  . loratadine (CLARITIN) 10 MG tablet Take 10 mg by mouth daily.   07/31/2020 at Unknown time  . niacin 500 MG tablet Take 500 mg by mouth 2 (two) times daily with a meal. Reported on 06/19/2015   07/31/2020 at Unknown time  . Omega-3 1000 MG CAPS Take 1,000 mg by mouth 2 (two) times daily.   07/31/2020 at Unknown time  . sitaGLIPtin (JANUVIA) 50 MG tablet Take 50 mg by mouth daily.    07/31/2020 at Unknown time  . doxycycline (VIBRA-TABS) 100 MG tablet Take 1 tablet (100 mg total) by mouth 2 (two) times daily. (Patient not taking: No sig reported) 28 tablet 0 Not Taking at Unknown time  . fenofibrate 54 MG tablet Take 1 tablet (54 mg total) by mouth at bedtime. (Patient not taking: Reported on 08/01/2020) 30 tablet 0 Not Taking at Unknown time     Vitals   Vitals:   08/02/20 1445 08/02/20 1455 08/02/20 1510 08/02/20 1600  BP: 121/61 103/75 (!) 107/56 95/68  Pulse:  89 86 90 84  Resp: (!) 22 (!) 22 (!) 22 (!) 22  Temp:    98.7 F (37.1 C)  TempSrc:    Axillary  SpO2: 100% 100% 100% 99%  Weight:      Height:  Body mass index is 32.95 kg/m.  Physical Exam   General: Laying comfortably in bed; LMA in place and being bagged. HENT: Normal oropharynx and mucosa. Normal external appearance of ears and nose. Neck: Supple, no pain or tenderness CV: No JVD. No peripheral edema. Pulmonary: Symmetric Chest rise. Being bagged. Abdomen: Soft to touch, non-tender. Ext: No cyanosis, edema, or deformity Skin: No rash. Normal palpation of skin.  Musculoskeletal: Normal digits and nails by inspection. No clubbing.  Neurologic Examination  Mental status/Cognition: eyes open and starring off in the space with no response to voice, noise or noxious stimuli. Brainstem reflexes: corneals + BL Pupils 66mm BL and very sluggish Unable to assess cough or gag with LMA in place.  Motor:  Muscle bulk: normal, tone rigid in BL upper extremities. No responsive to noxious stimuli in any of the extremities.  Coordination/Complex Motor:  Unable to assess.  Labs   CBC:  Recent Labs  Lab 08/01/20 1549 08/02/20 0603  WBC 15.1* 14.9*  HGB 10.4* 8.7*  HCT 31.4* 26.8*  MCV 86.5 85.1  PLT 163 573    Basic Metabolic Panel:  Lab Results  Component Value Date   NA 138 08/02/2020   K 4.8 08/02/2020   CO2 20 (L) 08/02/2020   GLUCOSE 141 (H) 08/02/2020   BUN 67 (H) 08/02/2020   CREATININE 2.42 (H) 08/02/2020   CALCIUM 8.1 (L) 08/02/2020   GFRNONAA 21 (L) 08/02/2020   GFRAA 31 (L) 05/03/2012   Lipid Panel:  Lab Results  Component Value Date   LDLCALC 35 08/31/2011   HgbA1c:  Lab Results  Component Value Date   HGBA1C 5.4 08/01/2020   Urine Drug Screen: No results found for: LABOPIA, COCAINSCRNUR, LABBENZ, AMPHETMU, THCU, LABBARB  Alcohol Level No results found for: [place]  CT Head without contrast: CTH was negative for a large hypodensity  concerning for a large territory infarct or hyperdensity concerning for an ICH  CT angio Head and Neck with contrast: No LVO, no basilar thrombus.   MRI Brain: Will get after cEEG.  cEEG:  Getting hooked up right now.  Impression   CLAUDEAN LEAVELLE is a 75 y.o. female with PMH significant for CKD, HTN, Gout, HTN, DM2, HLd who is admitted with T spine fx s/p fall who had sudden unresponsiveness with stiffening of BL upper extremities and starring off. Intubated in the ED and CTH with no ICH or large stroke, CTA with no LVO. Loaded with Keppra 2G IV once in the ED. Admitted to the ICU after intubation and being hooked up to cEEG with plan for MRI [place] afterwards.  No documented prior history of seizure, no documented EtOH use.  Primary Diagnosis:  Acute encephalopathy due to seizure. Concern for prolonged tonic seizure.  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 ______________________________________________________________________  This patient is critically ill and at significant risk of neurological worsening, death and care requires constant monitoring of vital signs, hemodynamics,respiratory and cardiac monitoring, neurological assessment, discussion with family, other specialists and medical decision making of high complexity. I spent 45 minutes of neurocritical care time  in the care of  this patient. This was time spent independent of any time provided by nurse practitioner or PA.  Donnetta Simpers [facility]  Pager Number [phone] 08/02/2020  5:07 PM   Thank you for the opportunity to take part in the care of this patient. If you have any further questions, please contact the neurology  consultation attending.  Signed,  [place] Pager Number [phone] _ _ _   _ __   _ __ _ _  __ __   _ __   __ _

## 2020-08-02 NOTE — Anesthesia Procedure Notes (Signed)
Procedure Name: Intubation Date/Time: 08/02/2020 2:50 PM Performed by: Oletta Lamas, CRNA Pre-anesthesia Checklist: Patient identified, Emergency Drugs available, Suction available and Patient being monitored Patient Re-evaluated:Patient Re-evaluated prior to induction Oxygen Delivery Method: Circle System Utilized Preoxygenation: Pre-oxygenation with 100% oxygen Induction Type: IV induction Ventilation: Two handed mask ventilation required Laryngoscope Size: Glidescope and 3 Grade View: Grade I Tube type: Oral Tube size: 7.5 mm Number of attempts: 1 Airway Equipment and Method: Stylet Placement Confirmation: ETT inserted through vocal cords under direct vision,  positive ETCO2 and breath sounds checked- equal and bilateral Secured at: 22 cm Tube secured with: Tape Dental Injury: Teeth and Oropharynx as per pre-operative assessment

## 2020-08-02 NOTE — Progress Notes (Signed)
Pt transported from IR to 4N without complications.

## 2020-08-02 NOTE — Progress Notes (Signed)
NEUROSURGERY PROGRESS NOTE  S/p T8-T11 fusion for T10 fracture Doing well. Denies any pain in her back or legs. No acute events overnight.  Good strength and sensation Incision CDI OOB with therapy today  Temp:  [97 F (36.1 C)-98.5 F (36.9 C)] 98.5 F (36.9 C) (05/12 0801) Pulse Rate:  [84-92] 89 (05/12 0801) Resp:  [13-20] 16 (05/12 0801) BP: (119-154)/(42-74) 131/54 (05/12 0801) SpO2:  [92 %-100 %] 95 % (05/12 0801) Arterial Line BP: (174-184)/(57-79) 177/57 (05/11 1555)  Eleonore Chiquito, NP 08/02/2020 8:15 AM

## 2020-08-02 NOTE — Evaluation (Signed)
Physical Therapy Evaluation Patient Details Name: Jacqueline Richmond MRN: [id number] DOB: 1946-02-11 Today's Date: 08/02/2020   History of Present Illness  75 yo female [facility] s/p fall down front brink steps with + head trauma. Pt sustained T10 fx with extension into T9-10 disc space, R pleural effusion. CTH shows L parietal scalp hematoma with no acute intracranial abnormality noted. s/p s/p T8-11 stabilization for unstable T10 fracture on 5/11. PMH includes type 2 diabetes, essential hypertension, CKD 4,  HLD, cataracts, R THA 2004.  Clinical Impression   Pt presents with generalized weakness, moderate back pain, impaired mobility, decreased knowledge and application of precautions, and decreased activity tolerance. Pt to benefit from acute PT to address deficits. Pt requiring mod assist for bed mobility and transfer OOB to recliner, pt fearful of falling and requiring step-by-step cues to transfer. Pt limited by pain and SpO2 desaturation today, with SPO2 86% on RA during mobility requiring 2LO2. Pt was independent PTA, recommending CIR to maximize functional recovery at d/c. PT to progress mobility as tolerated, and will continue to follow acutely.      Follow Up Recommendations CIR    Equipment Recommendations  Other (comment) (TBD)    Recommendations for Other Services       Precautions / Restrictions Precautions Precautions: Fall;Back Precaution Comments: reviewed BLT rules, log roll in/out of bed Required Braces or Orthoses: Spinal Brace Spinal Brace: Thoracolumbosacral orthotic;Applied in sitting position Restrictions Weight Bearing Restrictions: No      Mobility  Bed Mobility Overal bed mobility: Needs Assistance Bed Mobility: Rolling;Sidelying to Sit Rolling: Mod assist Sidelying to sit: Mod assist       General bed mobility comments: mod assist for trunk and LE management, log roll to EOB, and scooting to EOB with bed pad. Verbal cuing for sequencing  task.    Transfers Overall transfer level: Needs assistance Equipment used: Rolling walker (2 wheeled) Transfers: Sit to/from Stand Sit to Stand: Mod assist;From elevated surface         General transfer comment: Mod assist for power up, rise, steadying, and pivotal steps to reach recliner towards pt's R. VC for hand placement, step-by-step sequencing.  Ambulation/Gait                Stairs            Wheelchair Mobility    Modified Rankin (Stroke Patients Only)       Balance Overall balance assessment: History of Falls;Needs assistance (history of 3 falls in a month) Sitting-balance support: Feet supported;Bilateral upper extremity supported Sitting balance-Leahy Scale: Fair     Standing balance support: Bilateral upper extremity supported;During functional activity Standing balance-Leahy Scale: Poor Standing balance comment: reliant on external support                             Pertinent Vitals/Pain Pain Assessment: Faces Faces Pain Scale: Hurts even more Pain Location: back Pain Descriptors / Indicators: Sore;Discomfort;Grimacing Pain Intervention(s): Limited activity within patient's tolerance;Monitored during session;Repositioned    Home Living Family/patient expects to be discharged to:: Private residence Living Arrangements: Spouse/significant other Available Help at Discharge: Family;Available 24 hours/day (husband is 33) Type of Home: House Home Access: Stairs to enter;Level entry Entrance Stairs-Rails: None Entrance Stairs-Number of Steps: level entry through garage, 3 steps to enter front door Home Layout: Two level;Able to live on main level with bedroom/bathroom Home Equipment: Walker - 4 wheels;Bedside commode  Prior Function Level of Independence: Independent               Hand Dominance   Dominant Hand: Right    Extremity/Trunk Assessment   Upper Extremity Assessment Upper Extremity Assessment: Defer  to OT evaluation    Lower Extremity Assessment Lower Extremity Assessment: Generalized weakness    Cervical / Trunk Assessment Cervical / Trunk Assessment: Other exceptions Cervical / Trunk Exceptions: s/p back surgery  Communication   Communication: No difficulties  Cognition Arousal/Alertness: Awake/alert Behavior During Therapy: WFL for tasks assessed/performed Overall Cognitive Status: Impaired/Different from baseline Area of Impairment: Problem solving;Safety/judgement                         Safety/Judgement: Decreased awareness of safety;Decreased awareness of deficits   Problem Solving: Requires verbal cues;Difficulty sequencing;Requires tactile cues;Slow processing General Comments: Pt states she has been "confused" since surgery, states she isn't remembering things well but not observed during session. Pt requiring step-by-step cues for transfer OOB to recliner, pt stopping and starting mobility multiple times requiring cues to continue.      General Comments General comments (skin integrity, edema, etc.): SPO2 86% on RA during transfer, placed on 2LO2 via [place] for recovery to 90-91%.    Exercises     Assessment/Plan    PT Assessment Patient needs continued PT services  PT Problem List Decreased strength;Decreased mobility;Decreased safety awareness;Decreased activity tolerance;Decreased balance;Decreased knowledge of use of DME;Pain;Decreased cognition;Decreased knowledge of precautions;Cardiopulmonary status limiting activity       PT Treatment Interventions DME instruction;Therapeutic activities;Gait training;Therapeutic exercise;Patient/family education;Balance training;Functional mobility training;Neuromuscular re-education    PT Goals (Current goals can be found in the Care Plan section)  Acute Rehab PT Goals Patient Stated Goal: decrease pain PT Goal Formulation: With patient Time For Goal Achievement: 08/16/20 Potential to Achieve Goals: Good     Frequency Min 5X/week   Barriers to discharge        Co-evaluation               AM-PAC PT "6 Clicks" Mobility  Outcome Measure Help needed turning from your back to your side while in a flat bed without using bedrails?: A Lot Help needed moving from lying on your back to sitting on the side of a flat bed without using bedrails?: A Lot Help needed moving to and from a bed to a chair (including a wheelchair)?: A Lot Help needed standing up from a chair using your arms (e.g., wheelchair or bedside chair)?: A Lot Help needed to walk in hospital room?: Total Help needed climbing 3-5 steps with a railing? : Total 6 Click Score: 10    End of Session Equipment Utilized During Treatment: Back brace;Oxygen Activity Tolerance: Patient limited by pain;Patient tolerated treatment well Patient left: in chair;with call bell/phone within reach;with chair alarm set Nurse Communication: Mobility status PT Visit Diagnosis: History of falling (Z91.81);Difficulty in walking, not elsewhere classified (R26.2);Pain Pain - Right/Left:  (mid) Pain - part of body:  (back)    Time: 6269-4854 PT Time Calculation (min) (ACUTE ONLY): 30 min   Charges:   PT Evaluation $PT Eval Low Complexity: 1 Low PT Treatments $Therapeutic Activity: 8-22 mins        Stacie Glaze, PT DPT Acute Rehabilitation Services Pager [phone]  Office [phone]   Jacqueline Richmond 08/02/2020, 1:57 PM

## 2020-08-03 ENCOUNTER — Inpatient Hospital Stay (HOSPITAL_COMMUNITY): Payer: Medicare PPO

## 2020-08-03 DIAGNOSIS — J942 Hemothorax: Secondary | ICD-10-CM

## 2020-08-03 DIAGNOSIS — S22079A Unspecified fracture of T9-T10 vertebra, initial encounter for closed fracture: Secondary | ICD-10-CM | POA: Diagnosis not present

## 2020-08-03 DIAGNOSIS — I313 Pericardial effusion (noninflammatory): Secondary | ICD-10-CM

## 2020-08-03 DIAGNOSIS — N179 Acute kidney failure, unspecified: Secondary | ICD-10-CM | POA: Diagnosis not present

## 2020-08-03 DIAGNOSIS — J90 Pleural effusion, not elsewhere classified: Secondary | ICD-10-CM | POA: Diagnosis not present

## 2020-08-03 LAB — BODY FLUID CELL COUNT WITH DIFFERENTIAL
Eos, Fluid: 1 %
Lymphs, Fluid: 7 %
Monocyte-Macrophage-Serous Fluid: 16 % — ABNORMAL LOW (ref 50–90)
Neutrophil Count, Fluid: 76 % — ABNORMAL HIGH (ref 0–25)
Total Nucleated Cell Count, Fluid: 5300 cu mm — ABNORMAL HIGH (ref 0–1000)

## 2020-08-03 LAB — ECHOCARDIOGRAM COMPLETE
AR max vel: 2.3 cm2
AV Area VTI: 2.57 cm2
AV Area mean vel: 2.52 cm2
AV Mean grad: 3 mmHg
AV Peak grad: 6.3 mmHg
Ao pk vel: 1.25 m/s
Area-P 1/2: 2.66 cm2
Calc EF: 62.6 %
Height: 63 in
MV M vel: 5.12 m/s
MV Peak grad: 104.9 mmHg
S' Lateral: 2.9 cm
Single Plane A2C EF: 59.3 %
Single Plane A4C EF: 62.1 %
Weight: 2976 oz

## 2020-08-03 LAB — CBC
HCT: 27.9 % — ABNORMAL LOW (ref 36.0–46.0)
Hemoglobin: 8.8 g/dL — ABNORMAL LOW (ref 12.0–15.0)
MCH: 27.6 pg (ref 26.0–34.0)
MCHC: 31.5 g/dL (ref 30.0–36.0)
MCV: 87.5 fL (ref 80.0–100.0)
Platelets: 151 10*3/uL (ref 150–400)
RBC: 3.19 MIL/uL — ABNORMAL LOW (ref 3.87–5.11)
RDW: 17.1 % — ABNORMAL HIGH (ref 11.5–15.5)
WBC: 15.9 10*3/uL — ABNORMAL HIGH (ref 4.0–10.5)
nRBC: 0 % (ref 0.0–0.2)

## 2020-08-03 LAB — GLUCOSE, CAPILLARY
Glucose-Capillary: 71 mg/dL (ref 70–99)
Glucose-Capillary: 73 mg/dL (ref 70–99)
Glucose-Capillary: 72 mg/dL (ref 70–99)
Glucose-Capillary: 66 mg/dL — ABNORMAL LOW (ref 70–99)
Glucose-Capillary: 76 mg/dL (ref 70–99)
Glucose-Capillary: 89 mg/dL (ref 70–99)

## 2020-08-03 LAB — BASIC METABOLIC PANEL
Anion gap: 8 (ref 5–15)
BUN: 72 mg/dL — ABNORMAL HIGH (ref 8–23)
CO2: 20 mmol/L — ABNORMAL LOW (ref 22–32)
Calcium: 7.7 mg/dL — ABNORMAL LOW (ref 8.9–10.3)
Chloride: 111 mmol/L (ref 98–111)
Creatinine, Ser: 2.04 mg/dL — ABNORMAL HIGH (ref 0.44–1.00)
GFR, Estimated: 25 mL/min — ABNORMAL LOW (ref >60)
Glucose, Bld: 76 mg/dL (ref 70–99)
Potassium: 4.2 mmol/L (ref 3.5–5.1)
Sodium: 139 mmol/L (ref 135–145)

## 2020-08-03 LAB — ALBUMIN: Albumin: 2.4 g/dL — ABNORMAL LOW (ref 3.5–5.0)

## 2020-08-03 LAB — LACTATE DEHYDROGENASE, PLEURAL OR PERITONEAL FLUID: LD, Fluid: 530 U/L — ABNORMAL HIGH (ref 3–23)

## 2020-08-03 LAB — ALBUMIN, PLEURAL OR PERITONEAL FLUID: Albumin, Fluid: 2 g/dL

## 2020-08-03 LAB — MAGNESIUM: Magnesium: 2 mg/dL (ref 1.7–2.4)

## 2020-08-03 LAB — TRIGLYCERIDES: Triglycerides: 231 mg/dL — ABNORMAL HIGH (ref <150)

## 2020-08-03 LAB — LACTATE DEHYDROGENASE: LDH: 204 U/L — ABNORMAL HIGH (ref 98–192)

## 2020-08-03 IMAGING — DX DG CHEST 1V PORT
1 series · 1 of 1 positions shown · non-contrast
Comparison: [DATE]

CLINICAL DATA: Status post right thoracentesis

EXAM:
PORTABLE CHEST 1 VIEW

[chest]
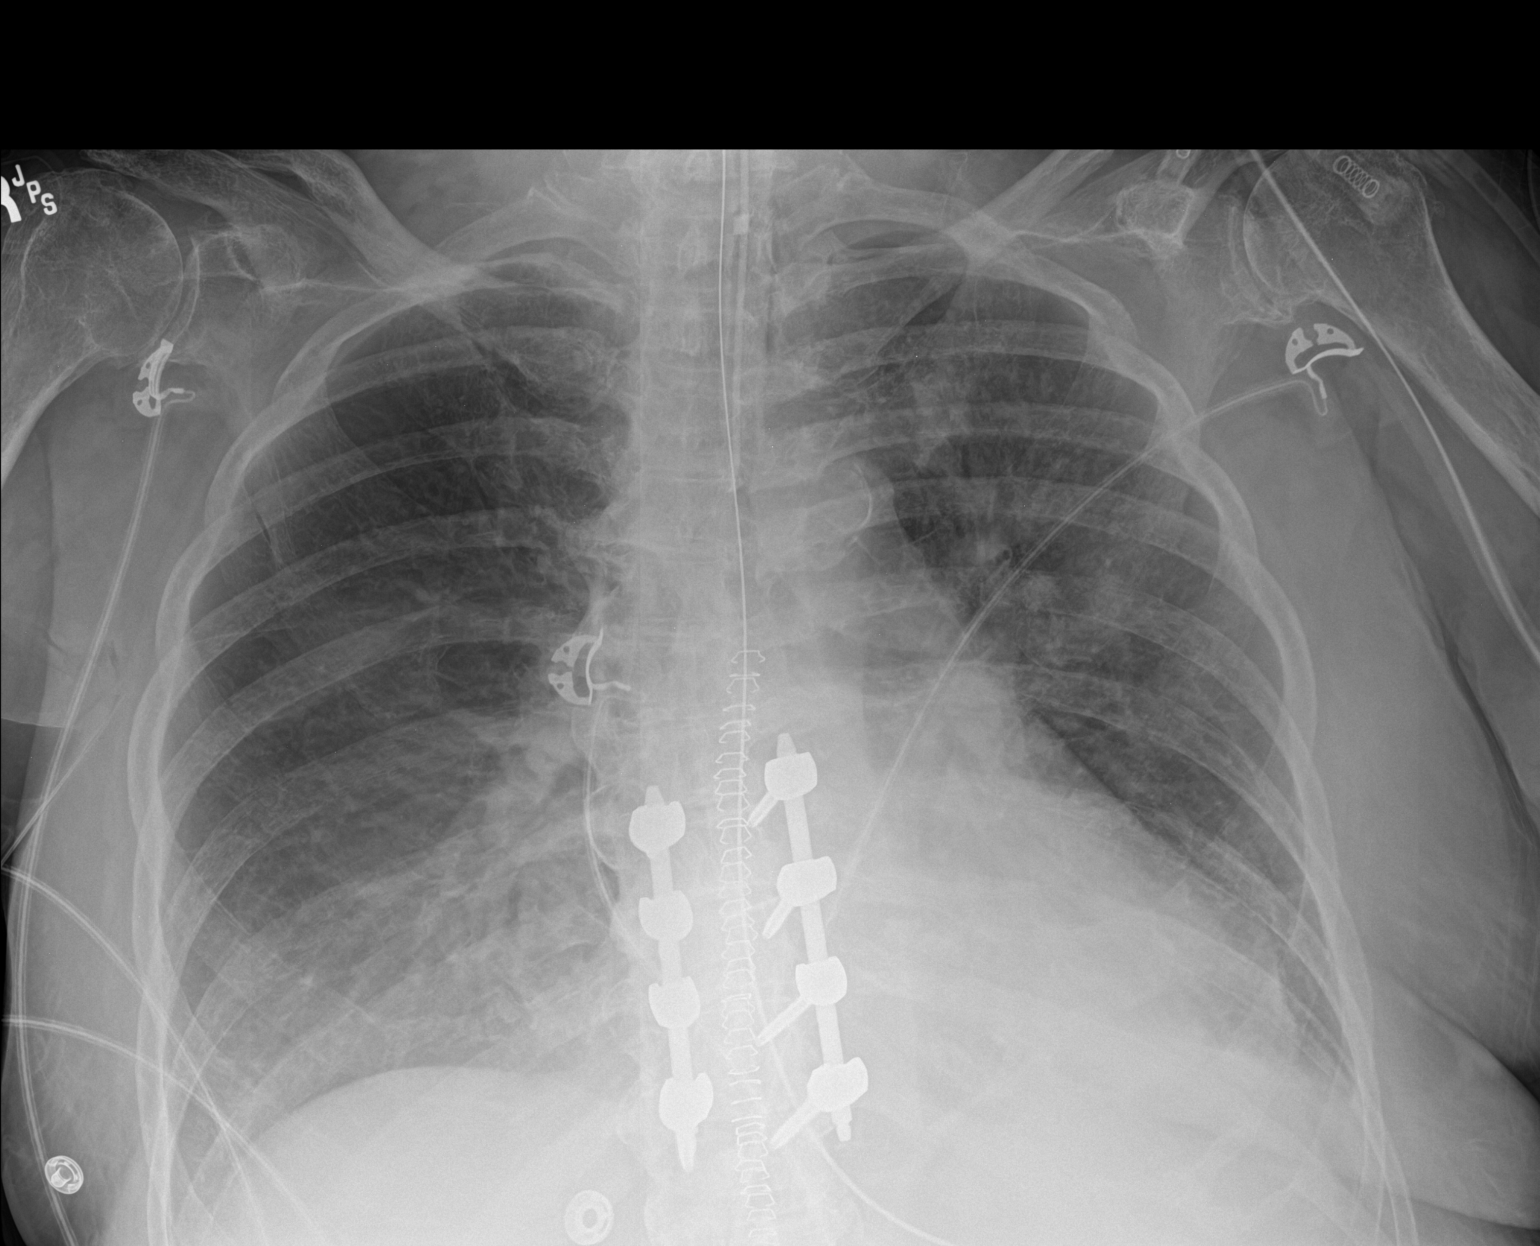

[1 of 1 positions shown; findings below may reference images not displayed]

FINDINGS: Improvement in the right effusion following thoracentesis. There is
better aeration in the right lower lung. Negative for pneumothorax.
Residual bibasilar atelectasis and left lower lobe airspace opacity
may represent pneumonia.

Heart is enlarged. Postop changes from lower thoracic fusion
evident. NG tube extends below the hemidiaphragms with the tip not
visualized as it enters the stomach. Endotracheal tube 3 cm above
the carina.
IMPRESSION: Improvement in the right effusion following thoracentesis. Negative
for pneumothorax. Otherwise stable portable chest exam.

## 2020-08-03 MED ORDER — CLONIDINE HCL 0.1 MG PO TABS
0.2000 mg | ORAL_TABLET | Freq: Two times a day (BID) | ORAL | Status: DC
  Administered 2020-08-03 – 2020-08-08 (×10): 0.2 mg via ORAL
  Filled 2020-08-03: qty 1
  Filled 2020-08-03 (×9): qty 2

## 2020-08-03 MED ORDER — ATENOLOL 25 MG PO TABS
100.0000 mg | ORAL_TABLET | Freq: Every day | ORAL | Status: DC
  Administered 2020-08-04 – 2020-08-08 (×5): 100 mg via ORAL
  Filled 2020-08-03 (×5): qty 4

## 2020-08-03 MED ORDER — SENNOSIDES-DOCUSATE SODIUM 8.6-50 MG PO TABS
2.0000 | ORAL_TABLET | Freq: Every day | ORAL | Status: DC
  Administered 2020-08-03 – 2020-08-07 (×5): 2 via ORAL
  Filled 2020-08-03 (×5): qty 2

## 2020-08-03 MED ORDER — OXYCODONE HCL 5 MG PO TABS
5.0000 mg | ORAL_TABLET | ORAL | Status: DC | PRN
  Administered 2020-08-04 – 2020-08-08 (×13): 5 mg via ORAL
  Filled 2020-08-03 (×13): qty 1

## 2020-08-03 MED ORDER — INSULIN ASPART 100 UNIT/ML IJ SOLN
0.0000 [IU] | INTRAMUSCULAR | Status: DC
  Administered 2020-08-04 (×2): 1 [IU] via SUBCUTANEOUS
  Administered 2020-08-04: 2 [IU] via SUBCUTANEOUS
  Administered 2020-08-05 – 2020-08-06 (×3): 1 [IU] via SUBCUTANEOUS
  Administered 2020-08-06 – 2020-08-07 (×2): 2 [IU] via SUBCUTANEOUS
  Administered 2020-08-07: 1 [IU] via SUBCUTANEOUS
  Administered 2020-08-08: 2 [IU] via SUBCUTANEOUS
  Administered 2020-08-08: 1 [IU] via SUBCUTANEOUS

## 2020-08-03 MED ORDER — INSULIN ASPART 100 UNIT/ML IJ SOLN: 0.0000 [IU] | Freq: Four times a day (QID) | INTRAMUSCULAR | Status: DC

## 2020-08-03 MED ORDER — POLYETHYLENE GLYCOL 3350 17 G PO PACK
17.0000 g | PACK | Freq: Every day | ORAL | Status: DC
  Administered 2020-08-04 – 2020-08-08 (×5): 17 g via ORAL
  Filled 2020-08-03 (×5): qty 1

## 2020-08-03 NOTE — Progress Notes (Signed)
NEUROSURGERY PROGRESS NOTE   Postop day 2 T8-T11 for T10 fracture. At some point yesterday patient decompensated and went into respiratory distress. She was intubated and transferred to the ICU. Patient is alert on the ventilator and nods appropriately to questions. She does not have any back pain and moving her legs well.   Temp:  [97.5 F (36.4 C)-98.8 F (37.1 C)] 97.5 F (36.4 C) (05/13 0800) Pulse Rate:  [63-90] 85 (05/13 0800) Resp:  [19-22] 19 (05/13 0800) BP: (80-166)/(49-93) 133/65 (05/13 0800) SpO2:  [82 %-100 %] 96 % (05/13 0800) FiO2 (%):  [30 %-100 %] 30 % (05/13 0800)  Eleonore Chiquito, NP 08/03/2020 8:58 AM

## 2020-08-03 NOTE — Progress Notes (Signed)
LTM discontinued; no skin breakdown was seen. [facility] notified.

## 2020-08-03 NOTE — Procedures (Signed)
Thoracentesis  Procedure Note  Jacqueline Richmond  [id number]  1946-01-14  Date:08/03/20  Time:11:43 AM   Provider Performing:Tymeir Weathington D Rollene Rotunda   Procedure: Thoracentesis with imaging guidance [phone])  Indication(s) Pleural Effusion  Consent Risks of the procedure as well as the alternatives and risks of each were explained to the patient and/or caregiver.  Consent for the procedure was obtained and is signed in the bedside chart  Anesthesia Topical only with 1% lidocaine    Time Out Verified patient identification, verified procedure, site/side was marked, verified correct patient position, special equipment/implants available, medications/allergies/relevant history reviewed, required imaging and test results available.   Sterile Technique Maximal sterile technique including full sterile barrier drape, hand hygiene, sterile gown, sterile gloves, mask, hair covering, sterile ultrasound probe cover (if used).  Procedure Description Ultrasound was used to identify appropriate pleural anatomy for placement and overlying skin marked.  Area of drainage cleaned and draped in sterile fashion. Lidocaine was used to anesthetize the skin and subcutaneous tissue.  1800 cc's of  appearing fluid was drained from the right pleural space. Catheter then removed and bandaid applied to site.   Complications/Tolerance None; patient tolerated the procedure well. Chest X-ray is ordered to confirm no post-procedural complication.   EBL Minimal   Specimen(s) Pleural fluid  JD Rexene Agent [facility] 08/03/2020, 11:44 AM  Please see [email or web address] for pager details.  From 7A-7P if no response, please call [phone]. After hours, please call ELink [phone].

## 2020-08-03 NOTE — Anesthesia Postprocedure Evaluation (Addendum)
Anesthesia Post Note  Patient: Jacqueline Richmond  Procedure(s) Performed: LAMINECTOMY WITH POSTERIOR LATERAL ARTHRODESIS Thoracic nine- Thoracic eleven  (N/A )     Patient location during evaluation: PACU Anesthesia Type: General Level of consciousness: awake and alert Pain management: pain level controlled Vital Signs Assessment: post-procedure vital signs reviewed and stable Respiratory status: spontaneous breathing, nonlabored ventilation, respiratory function stable and patient connected to nasal cannula oxygen Cardiovascular status: blood pressure returned to baseline and stable Postop Assessment: no apparent nausea or vomiting Anesthetic complications: no   No complications documented.  Last Vitals:  Vitals:   08/03/20 1200 08/03/20 1300  BP: 114/63 124/63  Pulse: 79 82  Resp: 20 15  Temp: (!) 36.1 C   SpO2: 100% 99%    Last Pain:  Vitals:   08/03/20 1200  TempSrc: Axillary  PainSc:    Pain Goal: Patients Stated Pain Goal: 3 (08/01/20 2313)                 Tiajuana Amass

## 2020-08-03 NOTE — Progress Notes (Signed)
Neurology Progress Note  Brief HPI: Jacqueline Richmond is a 75 y.o. female with PMH significant for CKD, HTN, Gout, HTN, DM2, HLD who is admitted with T spine fx s/p fall. She was in IR for thoracentesis when she was awake, alert and talking and signed the consent form herself at 1259. About 10 mins later, she was found to be unresponsive in the bed in the waiting area. Thoracentesis was delayed and Rapid response called. Neurology consulted and I immediately rushed to see her. Her vitals with sat in 96% on LMA, HR in 130s, SBP of 160s/ 140s. She was noted to have stiffening of BL upper extremities and starring off in the space with her RUE up and off the bed.  Subjective: Jacqueline Richmond is intubated, awake and alert this morning. She nods her head appropriately to yes and no questions. She is no acute distress and denies pain. She nods yes to indicate her understanding of negative head CT results and mouths a question of "when" upon discussion of extubation later today.  Objective: Vital signs in last 24 hours: Temp:  [97.5 F (36.4 C)-98.8 F (37.1 C)] 97.5 F (36.4 C) (05/13 0800) Pulse Rate:  [63-96] 88 (05/13 1000) Resp:  [19-24] 20 (05/13 1000) BP: (80-166)/(49-93) 95/54 (05/13 1000) SpO2:  [82 %-100 %] 92 % (05/13 1000) FiO2 (%):  [30 %-100 %] 30 % (05/13 0800)  Intake/Output from previous day: 05/12 0701 - 05/13 0700 In: 1466.5 [I.V.:1221.3; NG/GT:60; IV Piggyback:185.2] Out: 2275 [Urine:2275] Intake/Output this shift: Total I/O In: 63.2 [I.V.:63.2] Out: 100 [Urine:100] Nutritional status:  Diet Order            Diet NPO time specified  Diet effective now                 Physical Exam   General: intubated and in no acute distress, tolerating SBT HENT: Normal oropharynx and mucosa. Normal external appearance of ears and nose. Neck: Supple, no pain or tenderness CV: No JVD. No peripheral edema. Pulmonary: Symmetric Chest rise. Being bagged. Abdomen: Soft to touch,  non-tender. Ext: No cyanosis, edema, or deformity Skin: No rash. Normal palpation of skin.  Musculoskeletal: Normal digits and nails by inspection. No clubbing.  Neurologic Examination  Mental status/Cognition: alert, tracks movements across the room, responds to commands appropriately.  Brainstem reflexes: corneals + BL Pupils reactive BL Cranial Nerves:   CN II Pupils equal and reactive to light, no VF deficits    CN III,IV,VI EOM intact, no gaze preference or deviation, no nystagmus    CN V normal sensation in V1, V2, and V3 segments bilaterally    CN VII no asymmetry, no nasolabial fold flattening   CN VIII normal hearing to speech    CN IX & X Unable to assess due to ETT   CN XI 5/5 head turn and 5/5 shoulder shrug bilaterally    CN XII Unable to assess due to ETT   Motor:  Muscle bulk: normal, rigidity of upper extremities has resolved. Moves all extremities spontaneously. 5/5 strength in BL upper extremities, 4+/5 strength in BL lower extremities. Sensation: Intact to light touch and equal in all extremities.    Lab Results: Recent Labs    08/02/20 0603 08/02/20 1702 08/02/20 1709 08/03/20 0804  WBC 14.9*  --  22.7* 15.9*  HGB 8.7* 8.5* 9.5* 8.8*  HCT 26.8* 25.0* 28.8* 27.9*  PLT 160  --  160 151  NA 138 141  --  139  K 4.8  4.4  --  4.2  CL 110  --   --  111  CO2 20*  --   --  20*  GLUCOSE 141*  --   --  76  BUN 67*  --   --  72*  CREATININE 2.42*  --   --  2.04*  CALCIUM 8.1*  --   --  7.7*   Lipid Panel Recent Labs    08/03/20 0804  TRIG 231*    Imaging Reviewed:  EEG adult 5/12  IMPRESSION: This study is suggestive of moderate diffuse encephalopathy, nonspecific etiology but likely related to sedation. No seizures or epileptiform discharges were seen throughout the recording.  Overnight EEG with video 5/13  IMPRESSION: This study is suggestive of moderate diffuse encephalopathy, nonspecific etiology but likely related to sedation. No seizures  or epileptiform discharges were seen throughout the recording.    CT HEAD CODE STROKE WO CONTRAST 5/12  IMPRESSION: 1. No acute finding by CT. 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 Chronic calcification of the right pons. 2. ASPECTS is 10 3.   CT ANGIO HEAD CODE STROKE 5/12  IMPRESSION: No large vessel occlusion, hemodynamically significant stenosis, or evidence of dissection. Partially imaged right much greater than left pleural effusions. Density of pleural fluid is greater than simple fluid. Left lung consolidation and ground-glass density is new from 07/31/2020.    CT ANGIO NECK CODE STROKE 5/12  IMPRESSION: No large vessel occlusion, hemodynamically significant stenosis, or evidence of dissection. Partially imaged right much greater than left pleural effusions. Density of pleural fluid is greater than simple fluid. Left lung consolidation and ground-glass density is new from 07/31/2020.    Medications: I have reviewed the patient's current medications.   Impression  Assessment: 75 year old female with PMH significant for CKD, HTN, Gout, HTN, DM2, HLD who is admitted with T spine fx s/p fall who had sudden unresponsiveness with stiffening of BL upper extremities and starring off. Intubated in the ED and CTH with no ICH or large stroke, CTA with no LVO. Loaded with Keppra 2G IV once in the ED. Admitted to the ICU after intubation and being hooked up to cEEG with plan for MRI Brain afterwards. -EEG is suggestive of moderate diffuse encephalopathy, nonspecific etiology but likely related to sedation. No seizures or epileptiform discharges were seen throughout the recording. -Neurological exam is markedly improved today  Plan: - Discontinue long term continuous EEG - Continue inpatient seizure precautions - Will order MRI Brain - Encephalopathy is improving, will hold off on further work up at this time - Neurology will continue to follow

## 2020-08-03 NOTE — Procedures (Addendum)
Patient Name: Jacqueline Richmond  MRN: [id number]  Epilepsy Attending: Lora Havens  Referring Physician/Provider: Dr Donnetta Simpers Duration: 08/02/2020 1809 to 08/03/2020 1004  Patient history: 75 y.o.femalewith PMH significant for CKD, HTN, Gout, HTN, DM2, HLd who is admitted with T spine fx s/p fall who had sudden unresponsiveness with stiffening of BL upper extremities and starring off. EEG to evaluate for seizure  Level of alertness:  awake, asleep  AEDs during EEG study: LEV, Propofol  Technical aspects: This EEG study was done with scalp electrodes positioned according to the 10-20 International system of electrode placement. Electrical activity was acquired at a sampling rate of 500Hz  and reviewed with a high frequency filter of 70Hz  and a low frequency filter of 1Hz . EEG data were recorded continuously and digitally stored.   Description: The posterior dominant rhythm consists of 9 Hz activity of moderate voltage (25-35 uV) seen predominantly in posterior head regions, symmetric and reactive to eye opening and eye closing. Sleep was characterized by vertex waves, sleep spindles (12 to 14 Hz), maximal frontocentral region. EEG showed continuous generalized 3 to 6 Hz theta-delta slowing admixed with 15-18hz  fronto-central beta activity. Hyperventilation and photic stimulation were not performed.     ABNORMALITY - Continuous slow, generalized  IMPRESSION: This study is suggestive of moderate diffuse encephalopathy, nonspecific etiology but likely related to sedation. No seizures or epileptiform discharges were seen throughout the recording.  Jacqueline Richmond

## 2020-08-03 NOTE — Procedures (Signed)
Extubation Procedure Note  Patient Details:   Name: Jacqueline Richmond DOB: April 30, 1945 MRN: [id number]   Airway Documentation:    Vent end date: 08/03/20 Vent end time: 1442   Evaluation  O2 sats: stable throughout Complications: No apparent complications Patient did tolerate procedure well. Bilateral Breath Sounds: Diminished   Yes   Pt extubated to 3L [place] per MD order. Positive cuff leak noted prior to extubation. Pt able to speak and has a strong productive cough post extubation. Pt encouraged to use Y[place]auer to clear secretions. VS WNL  Jesse Sans 08/03/2020, 2:43 PM

## 2020-08-03 NOTE — Progress Notes (Signed)
Chart reviewed.  Patient is still intubated and thus will remain under PCCM care as primary service.  Hospitalist service will sign off.

## 2020-08-03 NOTE — Progress Notes (Signed)
Physical Therapy Treatment Patient Details Name: Jacqueline Richmond MRN: [id number] DOB: [birth year] Today's Date: 08/03/2020    History of Present Illness 75 yo female presents to [facility] on 5/10 s/p fall down front brink steps with + head trauma. Pt sustained T10 fx with extension into T9-10 disc space, R pleural effusion. CTH shows L parietal scalp hematoma with no acute intracranial abnormality noted. s/p T8-11 stabilization for unstable T10 fracture on 5/11. Rapid response 5/12 requiring intubation (extubated 5/13) due to unresponsiveness and hypoxia, concern for seizures.  PMH includes type 2 diabetes, essential hypertension, CKD 4,  HLD, cataracts, R THA 2004.    PT Comments    The pt was in bed upon arrival of PT, agreeable to session despite reports of pain and fatigue this afternoon following extubation. The pt reports no changes in LE sensation, and was able to demo anti-gravity movement in all four extremities. The pt continues to require assist to complete all bed mobility, as well as mod-max cues for technique, sequencing, spinal precautions, and safety with all mobility. The pt was able to maintain seated balance statically and dynamically without UE or LE supported. The pt reports she is in too much pain and too fatigued to progress to OOB mobility at this time, but was able to complete seated exercises for BLE to maintain movement and ROM. Continue to recommend CIR level therapies at d/c, goals set in evaluation remain appropriate.     Follow Up Recommendations  CIR     Equipment Recommendations  Other (comment) (defer to post acute)    Recommendations for Other Services       Precautions / Restrictions Precautions Precautions: Fall;Back Precaution Comments: reviewed BLT rules, log roll in/out of bed Required Braces or Orthoses: Spinal Brace Spinal Brace: Thoracolumbosacral orthotic;Applied in sitting position Restrictions Weight Bearing Restrictions: No Other Position/Activity  Restrictions: No bending, lifting, twisting or reaching over head    Mobility  Bed Mobility Overal bed mobility: Needs Assistance Bed Mobility: Rolling;Sit to Sidelying;Sidelying to Sit Rolling: Min assist Sidelying to sit: Max assist     Sit to sidelying: Mod assist General bed mobility comments: increased time to complete, modA to elevate trunk from [facility], and assist to BLE to return to bed. pt able to initiate roll but needs minA to complete    Transfers                 General transfer comment: pt declined due to pain and fatigue at this time        Balance Overall balance assessment: History of Falls;Needs assistance Sitting-balance support: No upper extremity supported Sitting balance-Leahy Scale: Fair Sitting balance - Comments: pt able to sustain sitting EOB without LE support or UE support, increased wobbles and sway.                                    Cognition Arousal/Alertness: Awake/alert Behavior During Therapy: WFL for tasks assessed/performed Overall Cognitive Status: Impaired/Different from baseline Area of Impairment: Safety/judgement;Problem solving;Memory                     Memory: Decreased short-term memory   Safety/Judgement: Decreased awareness of safety;Decreased awareness of deficits   Problem Solving: Requires verbal cues;Difficulty sequencing;Requires tactile cues;Slow processing General Comments: Pt initially claiming she has already been OOB with therapy today (however, pt has been on ventillator in bed all day). With cues, pt able  to admit she thinks she has her days wrong, and that she was off by a day. Pt unable to recall spinal precautions without cues, needed increased cues for safety and technique      Exercises General Exercises - Lower Extremity Ankle Circles/Pumps: AROM;Both;10 reps;Seated Long Arc Quad: AROM;Both;10 reps;Seated Heel Slides: AROM;Both;10 reps;Supine    General Comments General  comments (skin integrity, edema, etc.): SpO2 in 90s on 3L O2 through session      Pertinent Vitals/Pain Pain Assessment: Faces Faces Pain Scale: Hurts little more Pain Location: back Pain Descriptors / Indicators: Sore;Discomfort;Grimacing Pain Intervention(s): Limited activity within patient's tolerance;Monitored during session;Repositioned           PT Goals (current goals can now be found in the care plan section) Acute Rehab PT Goals Patient Stated Goal: decrease pain PT Goal Formulation: With patient Time For Goal Achievement: 08/16/20 Potential to Achieve Goals: Good Progress towards PT goals: Progressing toward goals    Frequency    Min 5X/week      PT Plan Current plan remains appropriate       AM-PAC PT "6 Clicks" Mobility   Outcome Measure  Help needed turning from your back to your side while in a flat bed without using bedrails?: A Little Help needed moving from lying on your back to sitting on the side of a flat bed without using bedrails?: A Lot Help needed moving to and from a bed to a chair (including a wheelchair)?: A Lot Help needed standing up from a chair using your arms (e.g., wheelchair or bedside chair)?: A Lot Help needed to walk in hospital room?: Total Help needed climbing 3-5 steps with a railing? : Total 6 Click Score: 11    End of Session Equipment Utilized During Treatment: Back brace;Oxygen Activity Tolerance: Patient limited by pain;Patient tolerated treatment well Patient left: in bed;with call bell/phone within reach;with bed alarm set Nurse Communication: Mobility status PT Visit Diagnosis: History of falling (Z91.81);Difficulty in walking, not elsewhere classified (R26.2);Pain     Time: 6789-3810 PT Time Calculation (min) (ACUTE ONLY): 24 min  Charges:  $Therapeutic Exercise: 8-22 mins $Therapeutic Activity: 8-22 mins                     Karma Ganja, PT, DPT   Acute Rehabilitation Department Pager #: [phone]   Otho Bellows 08/03/2020, 5:35 PM

## 2020-08-03 NOTE — Progress Notes (Signed)
NAME:  Jacqueline Richmond, MRN:  [id number], DOB:  [birth year], LOS: 2 ADMISSION DATE:  07/31/2020, CONSULTATION DATE:  08/02/2020 REFERRING MD:  Dr. Doristine Bosworth, CHIEF COMPLAINT:  AMS with inability to protect airway  History of Present Illness:  Jacqueline Richmond is a 75 y.o. female with PMH significant for type II DM, HTN, HLD, CKD stage 3/4, arthritis, and allergy who presented after suffering ground level fall at home. Patient denied any LOC. Head CT was negative but CT chest revealed significant thoracic injury including displaced T10 fracture that extended into the T9-T10 disc space and anterior vertebral body.  Fracture extends through anterior osteophytes as well as the anterior superior aspect of T11 vertebral body. There is adjacent paravertebral hemorrhage and small volume of gas likely related to escape vacuum phenomenon.  Neurosurgery was consulted due to these findings. CT chst also revealed moderate to large right pleural effusion.   Patient was admitted per [facility] for further management.   NSG preformed thoracic spine stabilization from T8-T11 5/11.   Afternoon of 5/12 while in IR for thoracentesis patient became unresponsive with ridge extremities x4. Given inability to protect airway patient was emergently intubated by anaesthesia. Urgent stroke CT was negative for acute findings.  EEG pending. PCCM consulted for further management.   Pertinent  Medical History  Type II DM, HTN, HLD, CKD stage 4, arthritis, and allergy  Significant Hospital Events: Including procedures, antibiotic start and stop dates in addition to other pertinent events   . 5/11 admitted  . 5/12 worsening AMS with concern for seizures, intubated and transfer to ICU   Interim History / Subjective:  New acute issues overnight Tolerating SBT trail this am  Daughter and daughter in law updated this AM  Objective   Blood pressure 133/65, pulse 85, temperature (!) 97.5 F (36.4 C), temperature source Axillary, resp. rate 19,  height 5\' 3"  (1.6 m), weight 84.4 kg, SpO2 96 %.    Vent Mode: PRVC FiO2 (%):  [30 %-100 %] 30 % Set Rate:  [20 bmp] 20 bmp Vt Set:  [420 mL] 420 mL PEEP:  [5 cmH20] 5 cmH20 Plateau Pressure:  [18 cmH20-26 cmH20] 21 cmH20   Intake/Output Summary (Last 24 hours) at 08/03/2020 0911 Last data filed at 08/03/2020 0800 Gross per 24 hour  Intake 1469.65 ml  Output 2375 ml  Net -905.35 ml   Filed Weights   07/31/20 1708  Weight: 84.4 kg    Examination: General: Acute on chronic ill appearing elderly female on mechanical ventilation, in NAD HEENT: ETT, MM pink/moist, PERRL,  Neuro: Alert and able to follow commands, non-focal  CV: s1s2 regular rate and rhythm, no murmur, rubs, or gallops,  PULM:  Diminished to bases, no increased work of breathing, no increased work of breathing  GI: soft, bowel sounds active in all 4 quadrants, non-tender, non-distended Extremities: warm/dry, no edema  Skin: no rashes or lesions  Labs/imaging that I havepersonally reviewed   CTA head and neck 5/12  No large vessel occlusion, hemodynamically significant stenosis, or evidence of dissection.  Resolved Hospital Problem list     Assessment & Plan:  Altered mental status of unknown etiology -Patient became acutely altered in IR while awaiting thoracentesis; unresponsive with respiratory failure and rigidity of all 4 extremities. RRT called, ventilated with LMA/BVM, subsequently intubated. Concern for stroke vs. Seizure vs. Other encephalopathy. Code stroke called. P: EEG negative overnight for acute seizure  Management per neurology  Maintain neuro protective measures; goal for eurothermia, euglycemia, eunatermia,  normoxia, and PCO2 goal of 35-40 Nutrition and bowel regiment  Seizure precautions  Aspirations precautions   Acute hypoxic respiratory failure -Intubated 5/12 in IR for airway protection  P: Tolerating SBT well, will extubate once pleural effusion Is drained  Continue ventilator  support with lung protective strategies  Wean PEEP and FiO2 for sats greater than 90%. Head of bed elevated 30 degrees. Plateau pressures less than 30 cm H20.  Follow intermittent chest x-ray and ABG.   SAT/SBT as tolerated, mentation preclude extubation  Ensure adequate pulmonary hygiene  Follow cultures  VAP bundle in place  PAD protocol  Bilateral pleural effusions Hemothorax s/p mechanical fall -CT Chest 5/10 demonstrating moderate to large R pleural effusion, heterogenous, ?hemorrhage vs. Collapsed lung. Presented to IR for thoracentesis 5/12; became altered (as above). P: [place] of chest confirms pleural effusion with hematocrit sign (likley blood with pleural effusion) CXR post thoracentesis Obtain and send pleural studies   Mechanical fall with T10 vertebral fracture S/p T10-T11 stabilization 08/01/2020 P: Primary management per neurosurgery TLSO brace when OOB Supportive care    AKI on CKD stage IV, improving  Baseline Cr 1.4-1.6. P: Follow renal function / urine output Trend Bmet Avoid nephrotoxins Ensure adequate renal perfusion  Remove foley   T2DM P: Continue SSI  CBG q4  Hypertension P: Resume home antihypertensive as hemodynamics allow  Continuous telemetry   Best practice   Diet:  NPO Pain/Anxiety/Delirium protocol (if indicated): Yes (RASS goal -1) VAP protocol (if indicated): Yes DVT prophylaxis: SCD GI prophylaxis: PPI Glucose control:  SSI Yes Central venous access:  N/A Arterial line:  N/A Foley:  Yes, and it is still needed Mobility:  bed rest  PT consulted: N/A Last date of multidisciplinary goals of care discussion Pending  Code Status:  full code Disposition: ICU   Critical care time:    Performed by: Johnsie Cancel  Total critical care time: 35 minutes  Critical care time was exclusive of separately billable procedures and treating other patients.  Critical care was necessary to treat or prevent imminent or  life-threatening deterioration.  Critical care was time spent personally by me on the following activities: development of treatment plan with patient and/or surrogate as well as nursing, discussions with consultants, evaluation of patient's response to treatment, examination of patient, obtaining history from patient or surrogate, ordering and performing treatments and interventions, ordering and review of laboratory studies, ordering and review of radiographic studies, pulse oximetry and re-evaluation of patient's condition.  Johnsie Cancel, NP-C [facility] Personal contact information can be found on Amion  08/03/2020, 9:11 AM

## 2020-08-04 ENCOUNTER — Inpatient Hospital Stay (HOSPITAL_COMMUNITY): Payer: Medicare PPO

## 2020-08-04 DIAGNOSIS — R55 Syncope and collapse: Secondary | ICD-10-CM

## 2020-08-04 LAB — BASIC METABOLIC PANEL
Anion gap: 5 (ref 5–15)
BUN: 60 mg/dL — ABNORMAL HIGH (ref 8–23)
CO2: 21 mmol/L — ABNORMAL LOW (ref 22–32)
Calcium: 8 mg/dL — ABNORMAL LOW (ref 8.9–10.3)
Chloride: 115 mmol/L — ABNORMAL HIGH (ref 98–111)
Creatinine, Ser: 1.59 mg/dL — ABNORMAL HIGH (ref 0.44–1.00)
GFR, Estimated: 34 mL/min — ABNORMAL LOW (ref >60)
Glucose, Bld: 101 mg/dL — ABNORMAL HIGH (ref 70–99)
Potassium: 4.2 mmol/L (ref 3.5–5.1)
Sodium: 141 mmol/L (ref 135–145)

## 2020-08-04 LAB — GLUCOSE, CAPILLARY
Glucose-Capillary: 89 mg/dL (ref 70–99)
Glucose-Capillary: 92 mg/dL (ref 70–99)
Glucose-Capillary: 166 mg/dL — ABNORMAL HIGH (ref 70–99)
Glucose-Capillary: 131 mg/dL — ABNORMAL HIGH (ref 70–99)
Glucose-Capillary: 111 mg/dL — ABNORMAL HIGH (ref 70–99)

## 2020-08-04 IMAGING — MR MR HEAD W/O CM
10 series · 48 of 48 positions shown · non-contrast
Comparison: CT and CTA from 2 days ago

CLINICAL DATA: Seizure with abnormal mental status

EXAM:
MRI HEAD WITHOUT CONTRAST
TECHNIQUE: Multiplanar, multiecho pulse sequences of the brain and surrounding
structures were obtained without intravenous contrast.

[Series 5: DWI · axial · 3.0mm · 0.88mm/px · z∈[-114,+38]mm · 10 of 104 slices shown (1 of 4)]
[im 1/104]
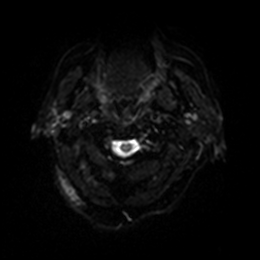
[im 12/104]
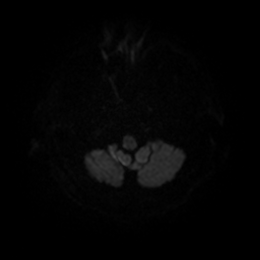
[im 23/104]
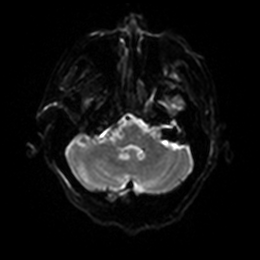
[im 35/104]
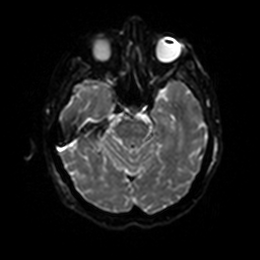
[im 46/104]
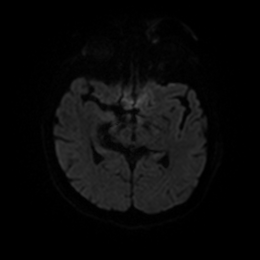
[im 58/104]
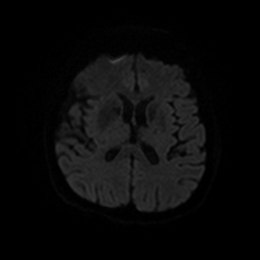
[im 69/104]
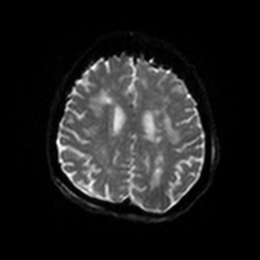
[im 81/104]
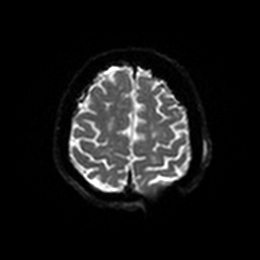
[im 92/104]
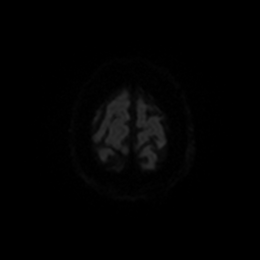
[im 104/104]
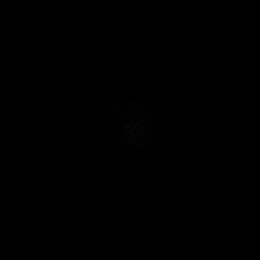

[Series 6: DWI · axial · 3.0mm · 0.88mm/px · z∈[-114,+38]mm · 5 of 52 slices shown (2 of 4)]
[im 1/52]
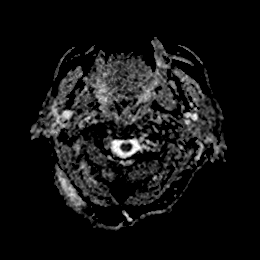
[im 13/52]
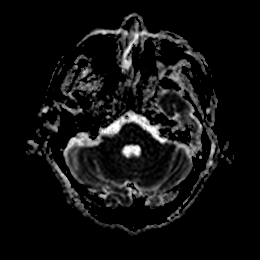
[im 26/52]
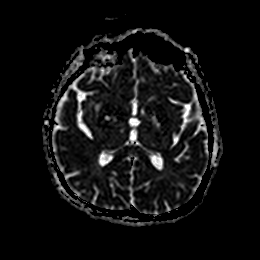
[im 39/52]
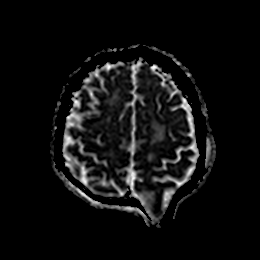
[im 52/52]
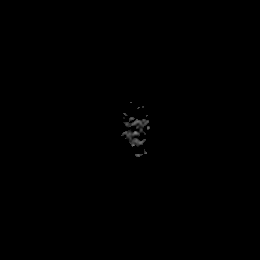

[Series 7: DWI · coronal · 4.0mm · 0.88mm/px · 6 of 64 slices shown (3 of 4)]
[im 1/64]
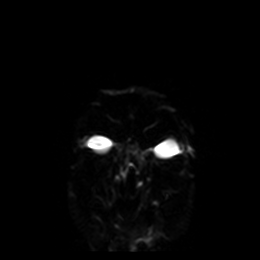
[im 13/64]
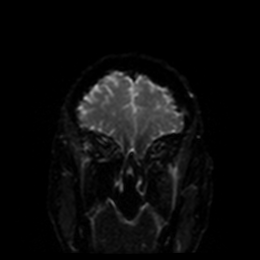
[im 26/64]
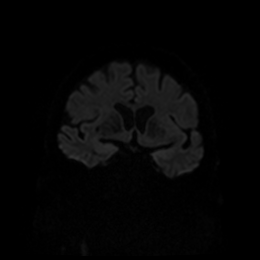
[im 38/64]
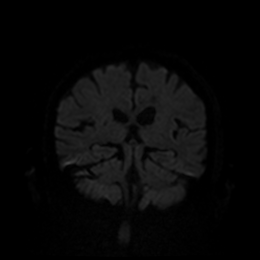
[im 51/64]
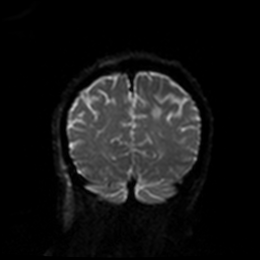
[im 64/64]
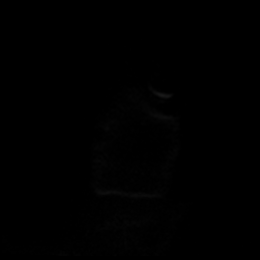

[Series 8: DWI · coronal · 4.0mm · 0.88mm/px · 3 of 32 slices shown (4 of 4)]
[im 1/32]
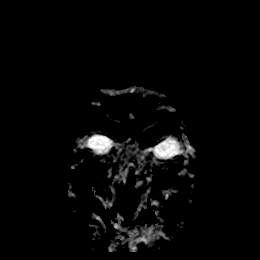
[im 16/32]
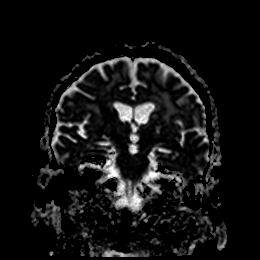
[im 32/32]
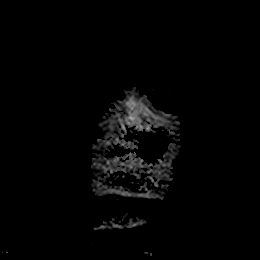

[Series 9: T1 · sagittal · 5.0mm · 0.75mm/px · 2 of 23 slices shown]
[im 1/23]
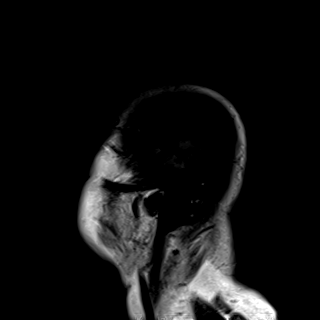
[im 23/23]
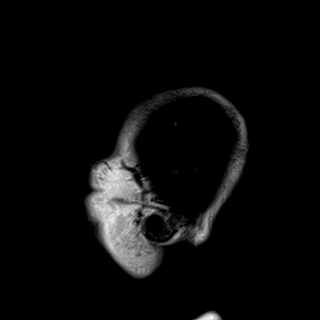

[Series 10: T2 · axial · 5.0mm · 0.72mm/px · z∈[-126,+39]mm · 3 of 29 slices shown]
[im 1/29]
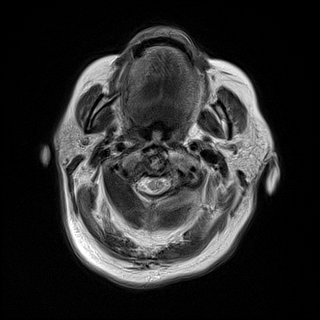
[im 15/29]
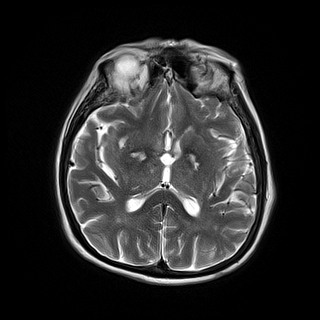
[im 29/29]
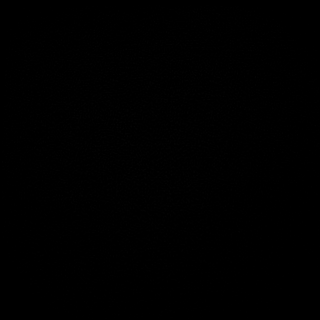

[Series 11: mag_images · axial · 3.0mm · 0.90mm/px · z∈[-122,+53]mm · 5 of 60 slices shown]
[im 1/60]
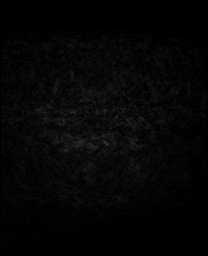
[im 15/60]
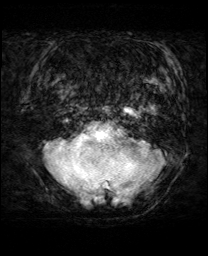
[im 30/60]
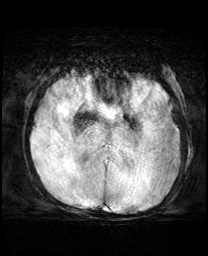
[im 45/60]
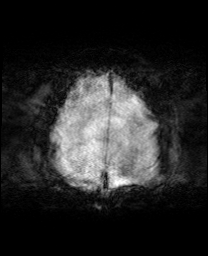
[im 60/60]
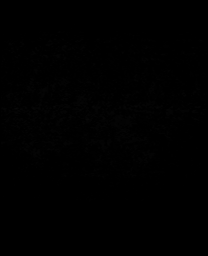

[Series 12: pha_images · axial · 3.0mm · 0.90mm/px · z∈[-110,+29]mm · 4 of 47 slices shown]
[im 1/47]
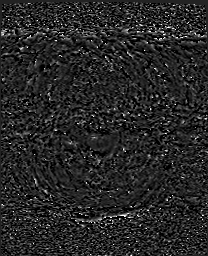
[im 16/47]
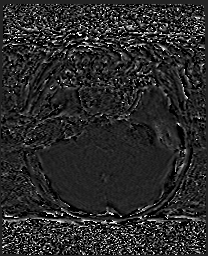
[im 31/47]
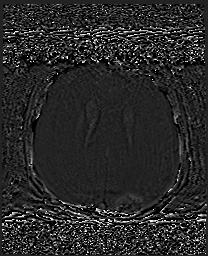
[im 47/47]
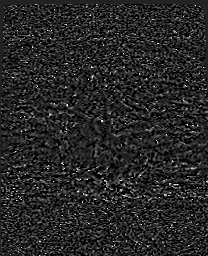

[Series 13: swi_images · axial · 3.0mm · 0.90mm/px · z∈[-122,+53]mm · 5 of 60 slices shown]
[im 1/60]
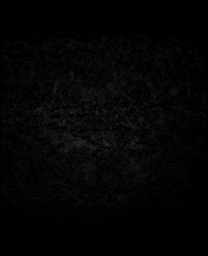
[im 15/60]
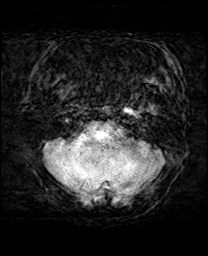
[im 30/60]
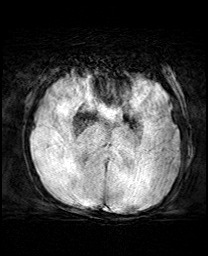
[im 45/60]
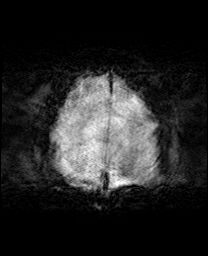
[im 60/60]
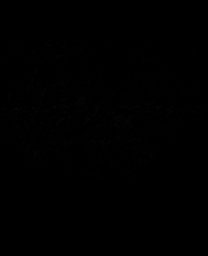

[Series 14: mip_images(sw) · axial · 24.0mm · 0.90mm/px · z∈[-112,+43]mm · 5 of 53 slices shown]
[im 1/53]
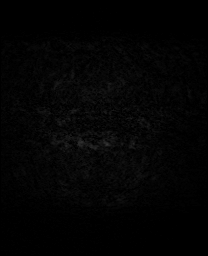
[im 14/53]
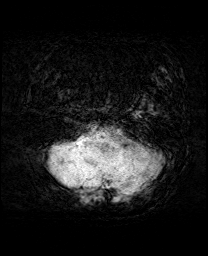
[im 27/53]
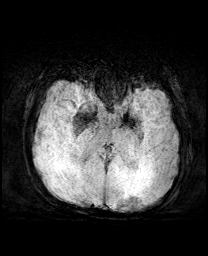
[im 40/53]
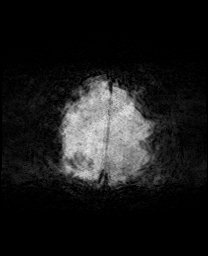
[im 53/53]
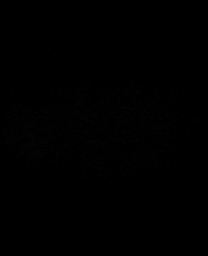

[48 of 48 positions shown; findings below may reference images not displayed]

FINDINGS: Brain: No acute infarction, hemorrhage, hydrocephalus, extra-axial
collection or mass lesion. Extensive low-density in the cerebral
white matter attributed to chronic small vessel ischemia. There is
both gliosis and scattered white matter lacunes. Cystic changes at
the bilateral globus pallidus which may reflect remote insult.
Preserved brain volume for age.

Vascular: Preserved flow voids

Skull and upper cervical spine: Normal marrow signal. Cervical spine
degeneration with C2-3 ankylosis. Artifact in the scalp where there
are skin staples by prior CT.

Sinuses/Orbits: No acute finding

Other: Truncated and motion degraded study. The patient was stopped
early due to hypoxia.
IMPRESSION: 1. No acute finding.
2. Extensive chronic small vessel disease.
3. Motion degraded and truncated study due to patient condition.

## 2020-08-04 IMAGING — DX DG CHEST 1V PORT
1 series · 1 of 1 positions shown · non-contrast
Comparison: Yesterday

CLINICAL DATA: Evaluate respiratory distress

EXAM:
PORTABLE CHEST 1 VIEW

[chest]
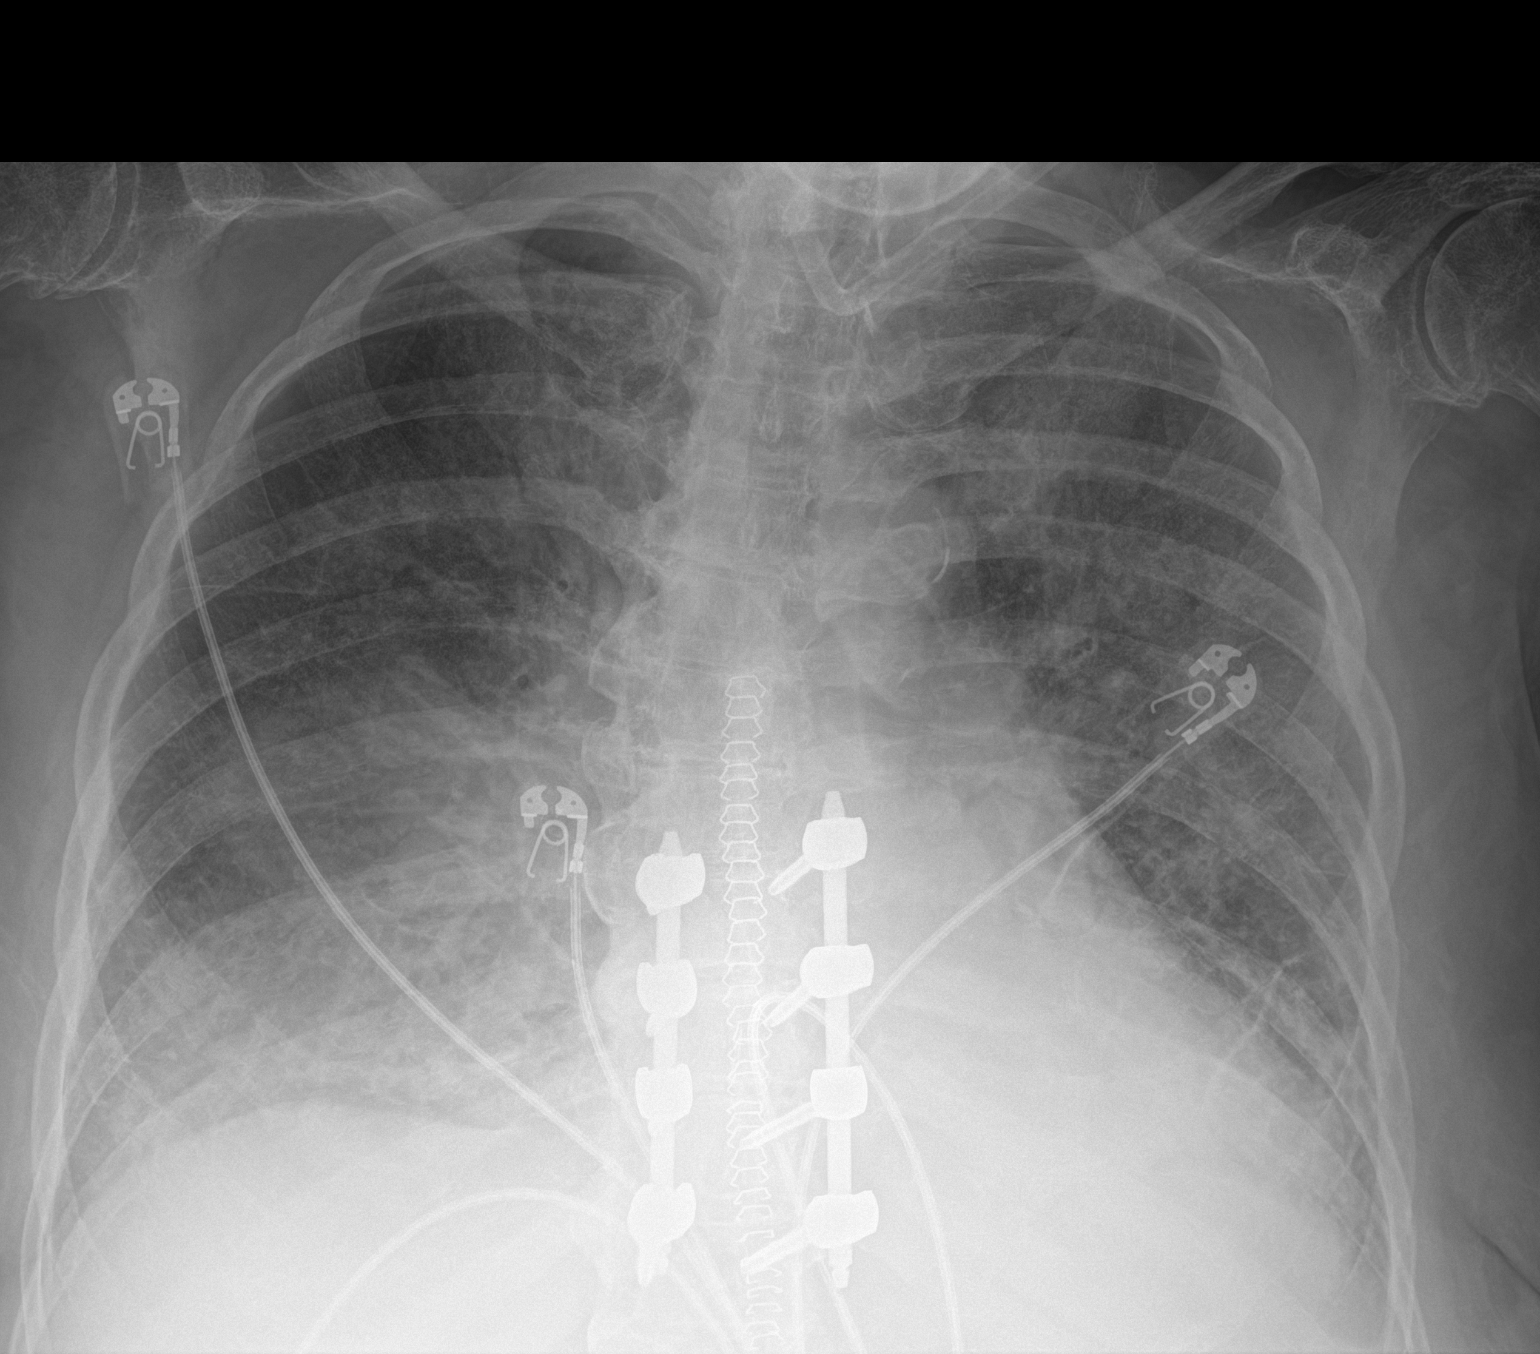

[1 of 1 positions shown; findings below may reference images not displayed]

FINDINGS: Cardiomegaly, indistinct hila, and diffuse interstitial and hazy
airspace opacity. There are likely small pleural effusions. No
pneumothorax.

Postoperative thoracic spine.
IMPRESSION: CHF.

## 2020-08-04 MED ORDER — DM-GUAIFENESIN ER 30-600 MG PO TB12
1.0000 | ORAL_TABLET | Freq: Two times a day (BID) | ORAL | Status: DC
  Administered 2020-08-04 – 2020-08-08 (×9): 1 via ORAL
  Filled 2020-08-04 (×9): qty 1

## 2020-08-04 MED ORDER — FUROSEMIDE 10 MG/ML IJ SOLN
40.0000 mg | Freq: Two times a day (BID) | INTRAMUSCULAR | Status: DC
  Administered 2020-08-04 – 2020-08-06 (×5): 40 mg via INTRAVENOUS
  Filled 2020-08-04 (×5): qty 4

## 2020-08-04 NOTE — Progress Notes (Signed)
Physical Therapy Treatment Patient Details Name: Jacqueline Richmond MRN: [id number] DOB: May 25, 1945 Today's Date: 08/04/2020    History of Present Illness 75 yo female presents to [facility] on 5/10 s/p fall down front brink steps with + head trauma. Pt sustained T10 fx with extension into T9-10 disc space, R pleural effusion. CTH shows L parietal scalp hematoma with no acute intracranial abnormality noted. s/p T8-11 stabilization for unstable T10 fracture on 5/11. Rapid response 5/12 requiring intubation (extubated 5/13) due to unresponsiveness and hypoxia, concern for seizures.  Pt with another rapid response event on 5/14 in MRI where she had to be placed on NRB due to low sats and difficulty breathing.  PMH includes type 2 diabetes, essential hypertension, CKD 4,  HLD, cataracts, R THA 2004.    PT Comments    Despite events this morning in MRI, pt was able to get OOB to the recliner chair with the use of the sara stedy standing frame.  She could not safely with the RW (we tried).  All mobility required heavy mod assist, but was accomplished with one person assist.  She would likely be safer with two for gait progression when she is ready.  PT will continue to follow acutely for safe mobility progression.   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 PT);Wheelchair cushion (measurements PT)    Recommendations for Other Services       Precautions / Restrictions Precautions Precautions: Fall;Back Precaution Booklet Issued: Yes (comment) Precaution Comments: reviewed BLT, log roll, back handout given and reviewed with pt and family Required Braces or Orthoses: Spinal Brace Spinal Brace: Thoracolumbosacral orthotic;Applied in sitting position    Mobility  Bed Mobility Overal bed mobility: Needs Assistance Bed Mobility: Rolling;Sidelying to Sit Rolling: Mod assist Sidelying to  sit: Mod assist       General bed mobility comments: Heavy mod assist to roll to the right and come up to sitting with HOB mildly elevated.  Cues for log roll technique.    Transfers Overall transfer level: Needs assistance Equipment used: Rolling walker (2 wheeled) Transfers: Sit to/from [facility] Sit to Stand: Mod assist;From elevated surface Stand pivot transfers: From elevated surface;Max assist (seated on steady)       General transfer comment: Pt stood x3 once from elevated bed to RW flexed at trunk and knees, so I did not feel safe transferring, and once from bed to steady and once from steady to chair.  All involved mod assist to power trunk up over weak legs, but steady was much safer for transfer than RW appeared to be.  Ambulation/Gait             General Gait Details: unable to yet   Stairs             Wheelchair Mobility    Modified Rankin (Stroke Patients Only)       Balance Overall balance assessment: Needs assistance Sitting-balance support: Feet supported;Bilateral upper extremity supported;No upper extremity supported;Single extremity supported Sitting balance-Leahy Scale: Fair     Standing balance support: Bilateral upper extremity supported Standing balance-Leahy Scale: Poor Standing balance comment: needs external support in standing mod assist with steady                            Cognition Arousal/Alertness: Awake/alert Behavior During Therapy: WFL for tasks assessed/performed Overall  Cognitive Status: Impaired/Different from baseline Area of Impairment: Memory                     Memory: Decreased short-term memory;Decreased recall of precautions         General Comments: Def some memory deficits, does not remember daughter being there a few days ago, doesn't remember where she is.  Just a bit fuzzy.      Exercises General Exercises - Lower Extremity Ankle Circles/Pumps: AROM;Both;20  reps (encouraged for circulation/clot prevention and edema management)    General Comments General comments (skin integrity, edema, etc.): dropped to lowest 87% on NRB 15L, RR in the mid 30s during mobility, back in the 20s at rest      Pertinent Vitals/Pain Pain Assessment: Faces Faces Pain Scale: Hurts whole lot Pain Location: back Pain Descriptors / Indicators: Grimacing;Guarding Pain Intervention(s): Limited activity within patient's tolerance;Monitored during session;Repositioned    Home Living                      Prior Function            PT Goals (current goals can now be found in the care plan section) Progress towards PT goals: Progressing toward goals    Frequency    Min 5X/week      PT Plan Current plan remains appropriate    Co-evaluation              AM-PAC PT "6 Clicks" Mobility   Outcome Measure  Help needed turning from your back to your side while in a flat bed without using bedrails?: A Lot Help needed moving from lying on your back to sitting on the side of a flat bed without using bedrails?: A Lot Help needed moving to and from a bed to a chair (including a wheelchair)?: A Lot Help needed standing up from a chair using your arms (e.g., wheelchair or bedside chair)?: A Lot Help needed to walk in hospital room?: Total Help needed climbing 3-5 steps with a railing? : Total 6 Click Score: 10    End of Session Equipment Utilized During Treatment: Back brace;Gait belt;Oxygen Activity Tolerance: Patient limited by pain;Other (comment) (and DOE) Patient left: in chair;with call bell/phone within reach;with chair alarm set Nurse Communication: Mobility status;Need for lift equipment PT Visit Diagnosis: History of falling (Z91.81);Difficulty in walking, not elsewhere classified (R26.2);Pain Pain - Right/Left:  (middle) Pain - part of body:  (low back incision)     Time: 4680-3212 PT Time Calculation (min) (ACUTE ONLY): 51  min  Charges:  $Therapeutic Activity: 23-37 mins $Self Care/Home Management: 8-22                     Verdene Lennert, PT, DPT  Acute Rehabilitation [phone] pager [phone]) [phone] office

## 2020-08-04 NOTE — Progress Notes (Signed)
Extubated and back on progressive.  States she is having some trouble with her breathing but she looks like her respirations are unlabored.  She denies back pain or leg pain or numbness tingling or weakness.  She moves her legs well in bed.  She is using her incentive spirometer.  Mobilize as tolerated.

## 2020-08-04 NOTE — Progress Notes (Signed)
PT desat during MRI procedure. SpO2 dropped down into the low 70's once the patient was laying flat on her back. Patient is having a hard time catching her breathe. This RN walked with patient back to her room with RRT. Patient is currently on 15L O2 nonrebreather mask 100 O2. Patient has taken off monneret mask twice and was educated not to however when patient does not have nonrebreather mask on correctly patient may drop down to 89 O2. Patient denies any pain and only complains of shortness of breath.MD notified.   Shelbie Proctor, RN

## 2020-08-04 NOTE — Significant Event (Signed)
Rapid Response Event Note   Reason for Call :  Oxygen desaturation  Initial Focused Assessment:  Received a call from the folks in MRI stating that this patient had dropped her oxygen saturation into the 60's.  They also stated that the patient was void of color. The MRI technicians had already put the patient back in her bed and had her sitting up when I got to her. The patient complains of having trouble catching her breath. On 4L [place] her oxygen saturation was in the low 80s.  A NRB was placed on the patient and her oxygen started to recover.    Her lungs were wet sounding on auscultation, her left side sound worse with rales and wheezing. The patient denies chest pain. The patient is adamant about using her incentive spirometer and I educated her that we need to get her O2 level back up and then she can use her IS.  BP 137/59 ECG 127 O2 94    Interventions:  16 L NRB placed on patient and the patient was transported back to 4N PCU without complication  Plan of Care:  MD ordered chest xray for today.     Event Summary:   MD Notified: Hospitalist Call Time: 0900 Arrival Time: 4967 End Time: [place], RN

## 2020-08-04 NOTE — Progress Notes (Addendum)
PROGRESS NOTE    Jacqueline Richmond  WJX:[id number] DOB: 1945-12-22 DOA: 07/31/2020 PCP: Bernerd Limbo, MD   Brief Narrative:  Jacqueline Richmond is a 75 y.o. female with medical history significant for type 2 diabetes, essential hypertension, CKD 4, who presented to [facility] ED after a mechanical fall at home.  According to her, she was sitting in a rocking chair in her porch for 1 to 2 hours then she got up loss of balance and fell on the steps hitting her head.  No loss of consciousness.  CT head nonacute.  CT chest without contrast showed extension injury of the thoracic spine with displaced T10 vertebral body fracture extending into the T9-T10 disc space and anterior vertebral body.  Fracture extends through anterior osteophytes as well as the anterior superior aspect of T11 vertebral body.  There is adjacent paravertebral hemorrhage and small volume of gas likely related to escape vacuum phenomenon.  Neurosurgery was consulted due to these findings.  Also showed on CT chest without contrast moderate to large right pleural effusion, heterogeneous density, may represent hemorrhage, collapsed lung or less likely masses, trauma surgery Dr. Kae Heller was consulted due to these findings.Marland Kitchen  Heterogeneously enlarged right lobe of the thyroid gland with scattered curvilinear calcifications.  Patient was otherwise hemodynamically stable in the ED and required only 2 L of oxygen.  She was admitted under hospitalist service.  Subsequently underwent T10-11 stabilization for unstable T10 fracture on 08/01/2020.  Due to moderate to large right hemothorax, thoracentesis was ordered for her on 08/02/2020 however before having procedure was started in the IR, patient had acute change of mental status and acute hypoxic respiratory failure for which rapid response was called.  Patient was unable to protect her airway so had to be intubated.  Code stroke was called.  She was loaded with Keppra 2 g IV by neurology.  She was transferred  under PCCM.  She had multiple work-up for stroke and she was ruled out of stroke.  She also had EEG and continuous EEG and both of them were negative for any epileptiform activity.  She underwent right-sided thoracentesis with removal of 1800 cc of bloody fluid.  She was subsequently extubated on 08/03/2020 and transferred under [place] again on 08/04/2020.  Assessment & Plan:   Active Problems:   Acute renal failure superimposed on stage 3a chronic kidney disease (HCC)   Hyperkalemia   HTN (hypertension)   T10 vertebral fracture (HCC)   Hemothorax on right  Displaced T10 vertebral fracture post mechanical fall at home: Neurosurgery on board and now she is status post T10-11 stabilization for unstable T10 fracture on 08/01/2020.  TLSO brace has been ordered by neurosurgery.  We will mobilize once it arrives.  She is doing well.  Appreciate neurosurgery help and defer management to them.  Large exudative right pleural effusion, unclear etiology CT chest without contrast moderate to large right pleural effusion, heterogeneous density, may represent hemorrhage, collapsed lung or less likely masses.  Trauma surgery was consulted initially but there was no comment regarding this on their note. thoracentesis was ordered for her on 08/02/2020 however before having procedure was started in the IR, patient had acute change of mental status and acute hypoxic respiratory failure for which rapid response was called.  Patient was unable to protect her airway so had to be intubated.  Status postthoracentesis with removal of 1800 cc of bloody fluid on 08/03/2020.  This is consistent with exudative fluid.  Acute hypoxic respiratory failure: Needed intubation  and then extubated on 08/03/2020.  Currently on nonrebreather.  sudden unresponsiveness with stiffening of BL upper extremities and starring off. CTH with no ICH or large stroke, CTA with no LVO. Loaded with Keppra 2G IV.  EEG is suggestive of moderate diffuse  encephalopathy, nonspecific etiology but likely related to sedation.No seizures or epileptiform discharges were seen throughout the recording.  Neurology exam is normal.  She has been taken off of antiseizure medications.   AKI on CKD stage IIIa: Her baseline creatinine seems to be around 1.4.  Trend has been 2.68>2.42> 2.04> 1.59.  Very close to her baseline.  Continue to avoid nephrotoxic agent.  Acute on chronic diastolic congestive heart failure: Patient has history of grade 1 diastolic dysfunction.  This morning while she was in MRI, she had hypoxia and was placed on nonrebreather Endep response was called.  I saw her in the room.  She had taken her nonrebreather mask off.  She was slightly tachypneic but fully alert and oriented.  Denied any shortness of breath.  She had rhonchi as well as crackles bilaterally in the middle lobes and lower lobes.  Chest x-ray shows pulmonary edema/congestive heart failure.  It appears that she was taking 40 mg of p.o. Lasix at home which has been held here.  I will start her on Lasix IV 40 mg twice daily with strict I's and O's.  Hyperkalemia: Resolved  Leukocytosis, likely reactive in the setting of fracture, improving.  She is afebrile.  Doubt infection.  Monitor.  Essential hypertension: Blood pressure stable.  Continue beta-blocker and clonidine.  Heterogeneously enlarged right lobe of the thyroid gland with scattered curvilinear calcifications.  TSH within normal range.  Ultrasound shows 1 nodule that meets criteria for FNA.  Not urgent at this point in time.  We will see if this can be done as inpatient once she is more stable.  Otherwise can be done as outpatient.  Type 2 diabetes mellitus: Blood sugar very well controlled.  Continue SSI.  Nausea and vomiting: Resolved.  DVT prophylaxis: SCDs Start: 08/02/20 0750   Code Status: Full Code  Family Communication: None present at bedside.  Plan of care discussed with patient in length and he  verbalized understanding and agreed with it.  Status is: Inpatient  Remains inpatient appropriate because:Inpatient level of care appropriate due to severity of illness   Dispo:  Patient From: Home  Planned Disposition: Home  Medically stable for discharge: No          Estimated body mass index is 32.95 kg/m as calculated from the following:   Height as of this encounter: '5\' 3"'  (1.6 m).   Weight as of this encounter: 84.4 kg.      Nutritional status:               Consultants:   Trauma surgery  Neurosurgery  Neurology  Procedures:   As above  Antimicrobials:  Anti-infectives (From admission, onward)   None         Subjective: Seen and examined in the room.  She had taken off her nonrebreather.  She was slightly tachypneic but fully alert and oriented and denied shortness of breath or in the complaint.  Objective: Vitals:   08/04/20 0000 08/04/20 0045 08/04/20 0415 08/04/20 0700  BP: 117/68 (!) 148/71 (!) 155/75 (!) 137/59  Pulse: 89 96 92   Resp: (!) 22 20 (!) 21   Temp: 98 F (36.7 C) 97.6 F (36.4 C) 98.2 F (36.8 C) 97.8 F (  36.6 C)  TempSrc: Oral Oral Oral Oral  SpO2: (!) 85% 91% 94%   Weight:      Height:        Intake/Output Summary (Last 24 hours) at 08/04/2020 1059 Last data filed at 08/04/2020 5456 Gross per 24 hour  Intake 799.43 ml  Output 3400 ml  Net -2600.57 ml   Filed Weights   07/31/20 1708  Weight: 84.4 kg    Examination: General exam: Appears slightly tachypneic Respiratory system: Slightly tachypneic with bilateral rhonchi and crackles. Cardiovascular system: S1 & S2 heard, sinus tachycardia. No JVD, murmurs, rubs, gallops or clicks.  Trace pitting edema bilateral lower extremity. Gastrointestinal system: Abdomen is nondistended, soft and nontender. No organomegaly or masses felt. Normal bowel sounds heard. Central nervous system: Alert and oriented. No focal neurological deficits. Extremities: Symmetric  5 x 5 power. Skin: No rashes, lesions or ulcers.  Psychiatry: Judgement and insight appear normal. Mood & affect appropriate.   Data Reviewed: I have personally reviewed following labs and imaging studies  CBC: Recent Labs  Lab 08/01/20 0559 08/01/20 1226 08/01/20 1549 08/02/20 0603 08/02/20 1702 08/02/20 1709 08/03/20 0804  WBC 18.5*  --  15.1* 14.9*  --  22.7* 15.9*  HGB 8.6*   < > 10.4* 8.7* 8.5* 9.5* 8.8*  HCT 27.4*   < > 31.4* 26.8* 25.0* 28.8* 27.9*  MCV 89.8  --  86.5 85.1  --  85.5 87.5  PLT 214  --  163 160  --  160 151   < > = values in this interval not displayed.   Basic Metabolic Panel: Recent Labs  Lab 07/31/20 1755 08/01/20 0559 08/01/20 1226 08/01/20 1336 08/02/20 0603 08/02/20 1702 08/03/20 0804 08/04/20 0425  NA 135 137 140 140 138 141 139 141  K 4.5 5.6* 4.5 4.9 4.8 4.4 4.2 4.2  CL 104 107 109  --  110  --  111 115*  CO2 21* 22  --   --  20*  --  20* 21*  GLUCOSE 113* 194* 156*  --  141*  --  76 101*  BUN 61* 68* 81*  --  67*  --  72* 60*  CREATININE 2.05* 2.68* 2.60*  --  2.42*  --  2.04* 1.59*  CALCIUM 9.4 8.7*  --   --  8.1*  --  7.7* 8.0*  MG  --  1.9  --   --   --   --  2.0  --   PHOS  --  6.4*  --   --   --   --   --   --    GFR: Estimated Creatinine Clearance: 32.4 mL/min (A) (by C-G formula based on SCr of 1.59 mg/dL (H)). Liver Function Tests: Recent Labs  Lab 08/01/20 0559 08/03/20 1159  AST 21  --   ALT 15  --   ALKPHOS 86  --   BILITOT 0.7  --   PROT 6.4*  --   ALBUMIN 2.7* 2.4*   No results for input(s): LIPASE, AMYLASE in the last 168 hours. No results for input(s): AMMONIA in the last 168 hours. Coagulation Profile: No results for input(s): INR, PROTIME in the last 168 hours. Cardiac Enzymes: No results for input(s): CKTOTAL, CKMB, CKMBINDEX, TROPONINI in the last 168 hours. BNP (last 3 results) No results for input(s): PROBNP in the last 8760 hours. HbA1C: No results for input(s): HGBA1C in the last 72  hours. CBG: Recent Labs  Lab 08/03/20 1921 08/03/20 2023 08/03/20  2320 08/04/20 0458 08/04/20 0720  GLUCAP 66* 76 89 89 92   Lipid Profile: Recent Labs    08/03/20 0804  TRIG 231*   Thyroid Function Tests: No results for input(s): TSH, T4TOTAL, FREET4, T3FREE, THYROIDAB in the last 72 hours. Anemia Panel: No results for input(s): VITAMINB12, FOLATE, FERRITIN, TIBC, IRON, RETICCTPCT in the last 72 hours. Sepsis Labs: Recent Labs  Lab 08/01/20 0018  LATICACIDVEN 1.7    Recent Results (from the past 240 hour(s))  Resp Panel by RT-PCR (Flu A&B, Covid) Nasopharyngeal Swab     Status: None   Collection Time: 07/31/20 11:08 PM   Specimen: Nasopharyngeal Swab; Nasopharyngeal(NP) swabs in vial transport medium  Result Value Ref Range Status   SARS Coronavirus 2 by RT PCR NEGATIVE NEGATIVE Final    Comment: (NOTE) SARS-CoV-2 target nucleic acids are NOT DETECTED.  The SARS-CoV-2 RNA is generally detectable in upper respiratory specimens during the acute phase of infection. The lowest concentration of SARS-CoV-2 viral copies this assay can detect is 138 copies/mL. A negative result does not preclude SARS-Cov-2 infection and should not be used as the sole basis for treatment or other patient management decisions. A negative result may occur with  improper specimen collection/handling, submission of specimen other than nasopharyngeal swab, presence of viral mutation(s) within the areas targeted by this assay, and inadequate number of viral copies(<138 copies/mL). A negative result must be combined with clinical observations, patient history, and epidemiological information. The expected result is Negative.  Fact Sheet for Patients:  EntrepreneurPulse.com.au  Fact Sheet for Healthcare Providers:  IncredibleEmployment.be  This test is no t yet approved or cleared by the [place] FDA and  has been authorized for detection and/or  diagnosis of SARS-CoV-2 by FDA under an Emergency Use Authorization (EUA). This EUA will remain  in effect (meaning this test can be used) for the duration of the COVID-19 declaration under Section 564(b)(1) of the Act, 21 U.S.C.section 360bbb-3(b)(1), unless the authorization is terminated  or revoked sooner.       Influenza A by PCR NEGATIVE NEGATIVE Final   Influenza B by PCR NEGATIVE NEGATIVE Final    Comment: (NOTE) The Xpert Xpress SARS-CoV-2/FLU/RSV plus assay is intended as an aid in the diagnosis of influenza from Nasopharyngeal swab specimens and should not be used as a sole basis for treatment. Nasal washings and aspirates are unacceptable for Xpert Xpress SARS-CoV-2/FLU/RSV testing.  Fact Sheet for Patients: EntrepreneurPulse.com.au  Fact Sheet for Healthcare Providers: IncredibleEmployment.be  This test is not yet approved or cleared by the [place] FDA and has been authorized for detection and/or diagnosis of SARS-CoV-2 by FDA under an Emergency Use Authorization (EUA). This EUA will remain in effect (meaning this test can be used) for the duration of the COVID-19 declaration under Section 564(b)(1) of the Act, 21 U.S.C. section 360bbb-3(b)(1), unless the authorization is terminated or revoked.  Performed at [facility], [address], [place], [place] [address]   Culture, blood (Routine X 2) w Reflex to ID Panel     Status: None (Preliminary result)   Collection Time: 08/01/20 12:05 AM   Specimen: BLOOD  Result Value Ref Range Status   Specimen Description   Final    BLOOD RIGHT ANTECUBITAL Performed at [facility], [address] [place], [place] [address]    Special Requests   Final    NONE Performed at [facility], [address], [place], [place] [address]    Culture   Final  NO GROWTH 1 DAY Performed at [facility], [place] [address] [place], [place] [address]    Report Status PENDING  Incomplete  Culture, blood (Routine X 2) w Reflex to ID Panel     Status: None (Preliminary result)   Collection Time: 08/01/20 12:10 AM   Specimen: BLOOD  Result Value Ref Range Status   Specimen Description   Final    BLOOD LEFT ANTECUBITAL Performed at [facility], [address], [place], [place] [address]    Special Requests   Final    NONE Performed at [facility], [address], [place], [place] [address]    Culture   Final    NO GROWTH 1 DAY Performed at [facility], [place] [address], [place], [place] [address]    Report Status PENDING  Incomplete  MRSA PCR Screening     Status: None   Collection Time: 08/01/20  2:59 AM   Specimen: Nasal Mucosa; Nasopharyngeal  Result Value Ref Range Status   MRSA by PCR NEGATIVE NEGATIVE Final    Comment:        The GeneXpert MRSA Assay (FDA approved for NASAL specimens only), is one component of a comprehensive MRSA colonization surveillance program. It is not intended to diagnose MRSA infection nor to guide or monitor treatment for MRSA infections. Performed at [facility], [place] [address] [place], [place] [address]   Body fluid culture w Gram Stain     Status: None (Preliminary result)   Collection Time: 08/03/20 11:30 AM   Specimen: Fluid  Result Value Ref Range Status   Specimen Description FLUID RIGHT PLEURAL  Final   Special Requests NONE  Final   Gram Stain PENDING  Incomplete   Culture   Final    NO GROWTH < 24 HOURS Performed at [facility], [place] [address] [place], [place] [address]    Report Status PENDING  Incomplete      Radiology Studies: MR BRAIN WO CONTRAST  Result Date: 08/04/2020 CLINICAL DATA:  Seizure with abnormal mental status EXAM: MRI HEAD WITHOUT CONTRAST TECHNIQUE: Multiplanar, multiecho pulse sequences of the brain and surrounding structures were obtained without intravenous  contrast. COMPARISON:  CT and CTA from 2 days ago FINDINGS: Brain: No acute infarction, hemorrhage, hydrocephalus, extra-axial collection or mass lesion. Extensive low-density in the cerebral white matter attributed to chronic small vessel ischemia. There is both gliosis and scattered white matter lacunes. Cystic changes at the bilateral globus pallidus which may reflect remote insult. Preserved brain volume for age. Vascular: Preserved flow voids Skull and upper cervical spine: Normal marrow signal. 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 Artifact in the scalp where there are skin staples by prior CT. Sinuses/Orbits: No acute finding Other: Truncated and motion degraded study. The patient was stopped early due to hypoxia. IMPRESSION: 1. No acute finding. 2. Extensive chronic small vessel disease. 3. Motion degraded and truncated study due to patient condition. Electronically Signed   By: Monte Fantasia M.D.   On: 08/04/2020 09:58   DG Chest Port 1 View  Result Date: 08/04/2020 CLINICAL DATA:  Evaluate respiratory distress EXAM: PORTABLE CHEST 1 VIEW COMPARISON:  Yesterday FINDINGS: Cardiomegaly, indistinct hila, and diffuse interstitial and hazy airspace opacity. There are likely small pleural effusions. No pneumothorax. Postoperative thoracic spine. IMPRESSION: CHF. Electronically Signed   By: Monte Fantasia M.D.   On: 08/04/2020 10:42   DG CHEST PORT 1 VIEW  Result Date: 08/03/2020 CLINICAL DATA:  Status post right thoracentesis EXAM: PORTABLE CHEST 1 VIEW COMPARISON:  08/02/2020 FINDINGS: Improvement in the right effusion following thoracentesis. There is better aeration in the right lower lung. Negative for pneumothorax. Residual bibasilar atelectasis and left lower lobe airspace opacity may represent pneumonia. Heart is enlarged. Postop changes from lower thoracic fusion evident. NG tube extends below the hemidiaphragms with the tip not visualized as it enters the stomach. Endotracheal tube  3 cm above the carina. IMPRESSION: Improvement in the right effusion following thoracentesis. Negative for pneumothorax. Otherwise stable portable chest exam. Electronically Signed   By: Jerilynn Mages.  Shick M.D.   On: 08/03/2020 13:32   DG CHEST PORT 1 VIEW  Result Date: 08/02/2020 CLINICAL DATA:  Intubation, ventilator dependence EXAM: PORTABLE CHEST 1 VIEW COMPARISON:  Portable exam 1557 hours compared to 07/31/2020 FINDINGS: Tip of endotracheal tube projects 1.9 cm above carina. Nasogastric tube extends into abdomen. Normal heart size, mediastinal contours, and pulmonary vascularity. Diffuse pulmonary infiltrates greater on LEFT, question asymmetric edema versus pneumonia. Persistent RIGHT pleural effusion. No pneumothorax or acute osseous findings. Bones demineralized with prior thoracic spinal fixation. IMPRESSION: Increased pulmonary infiltrates question edema versus infection. Persistent RIGHT pleural effusion. Electronically Signed   By: Lavonia Dana M.D.   On: 08/02/2020 17:19   EEG adult  Result Date: 08/02/2020 Lora Havens, MD     08/02/2020  9:08 PM Patient Name: LUVENA WENTLING MRN: [id number] Epilepsy Attending: Lora Havens Referring Physician/Provider: Dr Donnetta Simpers Date: 08/02/2020 Duration: 24.40 mins Patient history: 75 y.o. female with PMH significant for CKD, HTN, Gout, HTN, DM2, HLd who is admitted with T spine fx s/p fall who had sudden unresponsiveness with stiffening of BL upper extremities and starring off. EEG to evaluate for seizure Level of alertness:  comatose AEDs during EEG study: LEV Technical aspects: This EEG study was done with scalp electrodes positioned according to the 10-20 International system of electrode placement. Electrical activity was acquired at a sampling rate of '500Hz'  and reviewed with a high frequency filter of '70Hz'  and a low frequency filter of '1Hz' . EEG data were recorded continuously and digitally stored. Description: EEG showed continuous generalized 3  to 6 Hz theta-delta slowing admixed with 15-'18hz'  fronto-central beta activity. Hyperventilation and photic stimulation were not performed.   ABNORMALITY - Continuous slow, generalized IMPRESSION: This study is suggestive of moderate diffuse encephalopathy, nonspecific etiology but likely related to sedation. No seizures or epileptiform discharges were seen throughout the recording. Priyanka Barbra Sarks   Overnight EEG with video  Result Date: 08/03/2020 Lora Havens, MD     08/03/2020 11:20 AM Patient Name: PHYLISHA DIX MRN: [id number] Epilepsy Attending: Lora Havens Referring Physician/Provider: Dr Donnetta Simpers Duration: 08/02/2020 1809 to 08/03/2020 1004  Patient history: 75 y.o.femalewith PMH significant for CKD, HTN, Gout, HTN, DM2, HLd who is admitted with T spine fx s/p fall who had sudden unresponsiveness with stiffening of BL upper extremities and starring off. EEG to evaluate for seizure  Level of alertness:  awake, asleep  AEDs during EEG study: LEV, Propofol  Technical aspects: This EEG study was done with scalp electrodes positioned according to the 10-20 International system of electrode placement. Electrical activity was acquired at a sampling rate of '500Hz'  and reviewed with a high frequency filter of '70Hz'  and a low frequency filter of '1Hz' . EEG data were recorded continuously and digitally stored.  Description: The posterior dominant rhythm consists of 9 Hz activity of moderate voltage (25-35 uV) seen predominantly in posterior head  regions, symmetric and reactive to eye opening and eye closing. Sleep was characterized by vertex waves, sleep spindles (12 to 14 Hz), maximal frontocentral region. EEG showed continuous generalized 3 to 6 Hz theta-delta slowing admixed with 15-'18hz'  fronto-central beta activity. Hyperventilation and photic stimulation were not performed.    ABNORMALITY - Continuous slow, generalized  IMPRESSION: This study is suggestive of moderate diffuse  encephalopathy, nonspecific etiology but likely related to sedation. No seizures or epileptiform discharges were seen throughout the recording.  Lora Havens   ECHOCARDIOGRAM COMPLETE  Result Date: 08/03/2020    ECHOCARDIOGRAM REPORT   Patient Name:   SHANAH GUIMARAES Date of Exam: 08/03/2020 Medical Rec #:  [id number]     Height:       63.0 in Accession #:    [id number]    Weight:       186.0 lb Date of Birth:  04-Dec-1945      BSA:          1.875 m Patient Age:    67 years      BP:           95/54 mmHg Patient Gender: F             HR:           89 bpm. Exam Location:  Inpatient Procedure: 2D Echo, Cardiac Doppler and Color Doppler Indications:    Pleural effusion  History:        Patient has no prior history of Echocardiogram examinations.                 Risk Factors:Diabetes, Hypertension and Dyslipidemia.  Sonographer:    Luisa Hart RDCS Referring Phys: [place]  1. Left ventricular ejection fraction, by estimation, is 55 to 60%. The left ventricle has normal function. The left ventricle has no regional wall motion abnormalities. Left ventricular diastolic parameters are consistent with Grade I diastolic dysfunction (impaired relaxation).  2. Right ventricular systolic function is normal. The right ventricular size is normal. There is normal pulmonary artery systolic pressure. The estimated right ventricular systolic pressure is 44.0 mmHg.  3. The mitral valve is normal in structure. Trivial mitral valve regurgitation. No evidence of mitral stenosis.  4. The aortic valve is tricuspid. Aortic valve regurgitation is not visualized. Mild aortic valve sclerosis is present, with no evidence of aortic valve stenosis.  5. The inferior vena cava is normal in size with greater than 50% respiratory variability, suggesting right atrial pressure of 3 mmHg. FINDINGS  Left Ventricle: Left ventricular ejection fraction, by estimation, is 55 to 60%. The left ventricle has normal function. The left  ventricle has no regional wall motion abnormalities. The left ventricular internal cavity size was normal in size. There is  no left ventricular hypertrophy. Left ventricular diastolic parameters are consistent with Grade I diastolic dysfunction (impaired relaxation). Right Ventricle: The right ventricular size is normal. No increase in right ventricular wall thickness. Right ventricular systolic function is normal. There is normal pulmonary artery systolic pressure. The tricuspid regurgitant velocity is 2.69 m/s, and  with an assumed right atrial pressure of 3 mmHg, the estimated right ventricular systolic pressure is 34.7 mmHg. Left Atrium: Left atrial size was normal in size. Right Atrium: Right atrial size was normal in size. Pericardium: Trivial pericardial effusion is present. Mitral Valve: The mitral valve is normal in structure. Mild mitral annular calcification. Trivial mitral valve regurgitation. No evidence of mitral valve stenosis. Tricuspid Valve: The tricuspid valve  is normal in structure. Tricuspid valve regurgitation is trivial. Aortic Valve: The aortic valve is tricuspid. Aortic valve regurgitation is not visualized. Mild aortic valve sclerosis is present, with no evidence of aortic valve stenosis. Aortic valve mean gradient measures 3.0 mmHg. Aortic valve peak gradient measures 6.2 mmHg. Aortic valve area, by VTI measures 2.57 cm. Pulmonic Valve: The pulmonic valve was normal in structure. Pulmonic valve regurgitation is trivial. Aorta: The aortic root is normal in size and structure. Venous: The inferior vena cava is normal in size with greater than 50% respiratory variability, suggesting right atrial pressure of 3 mmHg. IAS/Shunts: No atrial level shunt detected by color flow Doppler.  LEFT VENTRICLE PLAX 2D LVIDd:         5.70 cm     Diastology LVIDs:         2.90 cm     LV e' medial:    5.87 cm/s LV PW:         1.00 cm     LV E/e' medial:  20.4 LV IVS:        0.80 cm     LV e' lateral:   6.53  cm/s LVOT diam:     2.00 cm     LV E/e' lateral: 18.4 LV SV:         68 LV SV Index:   36 LVOT Area:     3.14 cm  LV Volumes (MOD) LV vol d, MOD A2C: 55.0 ml LV vol d, MOD A4C: 68.9 ml LV vol s, MOD A2C: 22.4 ml LV vol s, MOD A4C: 26.1 ml LV SV MOD A2C:     32.6 ml LV SV MOD A4C:     68.9 ml LV SV MOD BP:      40.3 ml RIGHT VENTRICLE RV S prime:     11.00 cm/s TAPSE (M-mode): 2.6 cm LEFT ATRIUM           Index       RIGHT ATRIUM           Index LA diam:      4.00 cm 2.13 cm/m  RA Area:     11.70 cm LA Vol (A4C): 41.4 ml 22.08 ml/m RA Volume:   25.60 ml  13.65 ml/m  AORTIC VALVE                   PULMONIC VALVE AV Area (Vmax):    2.30 cm    PV Vmax:       1.06 m/s AV Area (Vmean):   2.52 cm    PV Vmean:      80.000 cm/s AV Area (VTI):     2.57 cm    PV VTI:        0.210 m AV Vmax:           125.00 cm/s PV Peak grad:  4.5 mmHg AV Vmean:          84.600 cm/s PV Mean grad:  3.0 mmHg AV VTI:            0.264 m AV Peak Grad:      6.2 mmHg AV Mean Grad:      3.0 mmHg LVOT Vmax:         91.70 cm/s LVOT Vmean:        67.800 cm/s LVOT VTI:          0.216 m LVOT/AV VTI ratio: 0.82  AORTA Ao Root diam: 3.40 cm Ao Asc diam:  3.50  cm MITRAL VALVE                TRICUSPID VALVE MV Area (PHT): 2.66 cm     TR Peak grad:   28.9 mmHg MV Decel Time: 285 msec     TR Vmax:        269.00 cm/s MR Peak grad: 104.9 mmHg MR Mean grad: 70.0 mmHg     SHUNTS MR Vmax:      512.00 cm/s   Systemic VTI:  0.22 m MR Vmean:     397.0 cm/s    Systemic Diam: 2.00 cm MV E velocity: 120.00 cm/s MV A velocity: 118.00 cm/s MV E/A ratio:  1.02 Loralie Champagne MD Electronically signed by Loralie Champagne MD Signature Date/Time: 08/03/2020/3:44:06 PM    Final    CT HEAD CODE STROKE WO CONTRAST  Result Date: 08/02/2020 CLINICAL DATA:  Code stroke. Unresponsive. Code stroke presentation. EXAM: CT HEAD WITHOUT CONTRAST TECHNIQUE: Contiguous axial images were obtained from the base of the skull through the vertex without intravenous contrast. 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 Cerebral hemispheres show extensive chronic small-vessel ischemic changes throughout the deep and subcortical white matter, basal ganglia and thalami. I do not identify an acute infarction, but acute infarction could be hidden within the extensive chronic changes. No mass, hemorrhage, hydrocephalus or extra-axial collection. Vascular: There is atherosclerotic calcification of the major vessels at the base of the brain. Skull: No skull fracture. Sinuses/Orbits: Clear presently. Chronic mucoperiosteal thickening of the left maxillary sinus. Orbits negative. Other: Left posterior parietal scalp staples. Adjacent sebaceous cyst of the scalp. ASPECTS [facility] Stroke Program Early CT Score) - Ganglionic level infarction (caudate, lentiform nuclei, internal capsule, insula, M1-M3 cortex): 7 - Supraganglionic infarction (M4-M6 cortex): 3 Total score (0-10 with 10 being normal): 10 IMPRESSION: 1. No acute finding by CT. 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 Chronic calcification of the right pons. 2. ASPECTS is 10 3. Discussed by telephone with Dr. Lorrin Goodell at 1412 hours. Electronically Signed   By: Nelson Chimes M.D.   On: 08/02/2020 14:14   CT ANGIO HEAD CODE STROKE  Result Date: 08/02/2020 CLINICAL DATA:  Unresponsive EXAM: CT ANGIOGRAPHY HEAD AND NECK TECHNIQUE: Multidetector CT imaging of the head and neck was performed using the standard protocol during bolus administration of intravenous contrast. Multiplanar CT image reconstructions and MIPs were obtained to evaluate the vascular anatomy. Carotid stenosis measurements (when applicable) are obtained utilizing NASCET criteria, using the distal internal carotid diameter as the denominator. CONTRAST:  31m OMNIPAQUE IOHEXOL 350 MG/ML SOLN COMPARISON:  None. FINDINGS: CTA NECK Aortic arch: Calcified plaque along the  arch and patent great vessel origins. Right carotid system: Patent. Common carotid is mildly displaced by enlarged thyroid. Calcified plaque at the bifurcation and proximal internal carotid with minimal stenosis. Left carotid system: Patent. Calcified plaque at the common carotid bifurcation and proximal internal carotid causing minimal stenosis. Vertebral arteries: Patent and codominant.  No stenosis. Skeleton: Advanced cervical spine degenerative changes. Degenerative changes of temporomandibular joints. Other neck: Enlarged, heterogeneous thyroid is evaluated on recent ultrasound. Endotracheal and enteric tubes are present. Upper chest: Right much greater than left pleural effusions and left lung consolidation and ground-glass density. Review of the MIP images confirms the above findings CTA HEAD Anterior circulation: The intracranial internal carotid arteries are patent with calcified plaque causing mild stenosis. Anterior and middle cerebral arteries are patent. Posterior circulation: Intracranial vertebral  arteries are patent with calcified plaque causing up to moderate stenosis. Basilar artery is patent. Major cerebellar artery origins are patent. Bilateral posterior communicating arteries are present. Posterior cerebral arteries are patent. Venous sinuses: Patent as allowed by contrast bolus timing. Review of the MIP images confirms the above findings IMPRESSION: No large vessel occlusion, hemodynamically significant stenosis, or evidence of dissection. Partially imaged right much greater than left pleural effusions. Density of pleural fluid is greater than simple fluid. Left lung consolidation and ground-glass density is new from 07/31/2020. Electronically Signed   By: Macy Mis M.D.   On: 08/02/2020 14:43   CT ANGIO NECK CODE STROKE  Result Date: 08/02/2020 CLINICAL DATA:  Unresponsive EXAM: CT ANGIOGRAPHY HEAD AND NECK TECHNIQUE: Multidetector CT imaging of the head and neck was performed using  the standard protocol during bolus administration of intravenous contrast. Multiplanar CT image reconstructions and MIPs were obtained to evaluate the vascular anatomy. Carotid stenosis measurements (when applicable) are obtained utilizing NASCET criteria, using the distal internal carotid diameter as the denominator. CONTRAST:  48m OMNIPAQUE IOHEXOL 350 MG/ML SOLN COMPARISON:  None. FINDINGS: CTA NECK Aortic arch: Calcified plaque along the arch and patent great vessel origins. Right carotid system: Patent. Common carotid is mildly displaced by enlarged thyroid. Calcified plaque at the bifurcation and proximal internal carotid with minimal stenosis. Left carotid system: Patent. Calcified plaque at the common carotid bifurcation and proximal internal carotid causing minimal stenosis. Vertebral arteries: Patent and codominant.  No stenosis. Skeleton: Advanced cervical spine degenerative changes. Degenerative changes of temporomandibular joints. Other neck: Enlarged, heterogeneous thyroid is evaluated on recent ultrasound. Endotracheal and enteric tubes are present. Upper chest: Right much greater than left pleural effusions and left lung consolidation and ground-glass density. Review of the MIP images confirms the above findings CTA HEAD Anterior circulation: The intracranial internal carotid arteries are patent with calcified plaque causing mild stenosis. Anterior and middle cerebral arteries are patent. Posterior circulation: Intracranial vertebral arteries are patent with calcified plaque causing up to moderate stenosis. Basilar artery is patent. Major cerebellar artery origins are patent. Bilateral posterior communicating arteries are present. Posterior cerebral arteries are patent. Venous sinuses: Patent as allowed by contrast bolus timing. Review of the MIP images confirms the above findings IMPRESSION: No large vessel occlusion, hemodynamically significant stenosis, or evidence of dissection. Partially imaged  right much greater than left pleural effusions. Density of pleural fluid is greater than simple fluid. Left lung consolidation and ground-glass density is new from 07/31/2020. Electronically Signed   By: PMacy MisM.D.   On: 08/02/2020 14:43    Scheduled Meds: . atenolol  100 mg Oral Daily  . chlorhexidine gluconate (MEDLINE KIT)  15 mL Mouth Rinse BID  . Chlorhexidine Gluconate Cloth  6 each Topical Daily  . cloNIDine  0.2 mg Oral BID  . dextromethorphan-guaiFENesin  1 tablet Oral BID  . furosemide  40 mg Intravenous BID  . insulin aspart  0-9 Units Subcutaneous Q4H  . polyethylene glycol  17 g Oral Daily  . senna-docusate  2 tablet Oral QHS   Continuous Infusions:    LOS: 3 days   Time spent: 35 minutes   RDarliss Cheney MD [facility]  08/04/2020, 10:59 AM   How to contact the [facility] or Consulting provider [place] covering provider during after hours [place] for this patient?  1. Check the care team [facility] look for a) attending/consulting [facility] provider listed and b) the [facility] listed. Page or  secure chat [phone]. 2. Log into [email or web address] and use Center's universal password to access. If you do not have the password, please contact the hospital operator. 3. Locate the [facility] provider you are looking for under [facility] and page to a number that you can be directly reached. 4. If you still have difficulty reaching the provider, please page the [facility] (Director on Call) for the Hospitalists listed on amion for assistance.

## 2020-08-05 LAB — BASIC METABOLIC PANEL
Anion gap: 5 (ref 5–15)
BUN: 58 mg/dL — ABNORMAL HIGH (ref 8–23)
CO2: 24 mmol/L (ref 22–32)
Calcium: 8.1 mg/dL — ABNORMAL LOW (ref 8.9–10.3)
Chloride: 112 mmol/L — ABNORMAL HIGH (ref 98–111)
Creatinine, Ser: 1.57 mg/dL — ABNORMAL HIGH (ref 0.44–1.00)
GFR, Estimated: 35 mL/min — ABNORMAL LOW (ref >60)
Glucose, Bld: 102 mg/dL — ABNORMAL HIGH (ref 70–99)
Potassium: 4.4 mmol/L (ref 3.5–5.1)
Sodium: 141 mmol/L (ref 135–145)

## 2020-08-05 LAB — GLUCOSE, CAPILLARY
Glucose-Capillary: 111 mg/dL — ABNORMAL HIGH (ref 70–99)
Glucose-Capillary: 122 mg/dL — ABNORMAL HIGH (ref 70–99)
Glucose-Capillary: 125 mg/dL — ABNORMAL HIGH (ref 70–99)
Glucose-Capillary: 128 mg/dL — ABNORMAL HIGH (ref 70–99)
Glucose-Capillary: 137 mg/dL — ABNORMAL HIGH (ref 70–99)
Glucose-Capillary: 88 mg/dL (ref 70–99)
Glucose-Capillary: 90 mg/dL (ref 70–99)
Glucose-Capillary: 95 mg/dL (ref 70–99)

## 2020-08-05 LAB — CBC WITH DIFFERENTIAL/PLATELET
Abs Immature Granulocytes: 0.1 10*3/uL — ABNORMAL HIGH (ref 0.00–0.07)
Basophils Absolute: 0.1 10*3/uL (ref 0.0–0.1)
Basophils Relative: 1 %
Eosinophils Absolute: 0.2 10*3/uL (ref 0.0–0.5)
Eosinophils Relative: 2 %
HCT: 27.9 % — ABNORMAL LOW (ref 36.0–46.0)
Hemoglobin: 8.7 g/dL — ABNORMAL LOW (ref 12.0–15.0)
Immature Granulocytes: 1 %
Lymphocytes Relative: 16 %
Lymphs Abs: 2.1 10*3/uL (ref 0.7–4.0)
MCH: 27.7 pg (ref 26.0–34.0)
MCHC: 31.2 g/dL (ref 30.0–36.0)
MCV: 88.9 fL (ref 80.0–100.0)
Monocytes Absolute: 1.1 10*3/uL — ABNORMAL HIGH (ref 0.1–1.0)
Monocytes Relative: 8 %
Neutro Abs: 9.6 10*3/uL — ABNORMAL HIGH (ref 1.7–7.7)
Neutrophils Relative %: 72 %
Platelets: 176 10*3/uL (ref 150–400)
RBC: 3.14 MIL/uL — ABNORMAL LOW (ref 3.87–5.11)
RDW: 16.3 % — ABNORMAL HIGH (ref 11.5–15.5)
WBC: 13.1 10*3/uL — ABNORMAL HIGH (ref 4.0–10.5)
nRBC: 0 % (ref 0.0–0.2)

## 2020-08-05 LAB — PROCALCITONIN: Procalcitonin: 16.21 ng/mL

## 2020-08-05 NOTE — Progress Notes (Signed)
PROGRESS NOTE    Jacqueline Richmond  PTW:[id number] DOB: 06-Dec-1945 DOA: 07/31/2020 PCP: Bernerd Limbo, MD   Brief Narrative:  Jacqueline Richmond is a 75 y.o. female with medical history significant for type 2 diabetes, essential hypertension, CKD 4, who presented to [facility] ED after a mechanical fall at home.  According to her, she was sitting in a rocking chair in her porch for 1 to 2 hours then she got up loss of balance and fell on the steps hitting her head.  No loss of consciousness.  CT head nonacute.  CT chest without contrast showed extension injury of the thoracic spine with displaced T10 vertebral body fracture extending into the T9-T10 disc space and anterior vertebral body.  Fracture extends through anterior osteophytes as well as the anterior superior aspect of T11 vertebral body.  There is adjacent paravertebral hemorrhage and small volume of gas likely related to escape vacuum phenomenon.  Neurosurgery was consulted due to these findings.  Also showed on CT chest without contrast moderate to large right pleural effusion, heterogeneous density, may represent hemorrhage, collapsed lung or less likely masses, trauma surgery Dr. Kae Heller was consulted due to these findings.Marland Kitchen  Heterogeneously enlarged right lobe of the thyroid gland with scattered curvilinear calcifications.  Patient was otherwise hemodynamically stable in the ED and required only 2 L of oxygen.  She was admitted under hospitalist service.  Subsequently underwent T10-11 stabilization for unstable T10 fracture on 08/01/2020.  Due to moderate to large right hemothorax, thoracentesis was ordered for her on 08/02/2020 however before having procedure was started in the IR, patient had acute change of mental status and acute hypoxic respiratory failure for which rapid response was called.  Patient was unable to protect her airway so had to be intubated.  Code stroke was called.  She was loaded with Keppra 2 g IV by neurology.  She was transferred  under PCCM.  She had multiple work-up for stroke and she was ruled out of stroke.  She also had EEG and continuous EEG and both of them were negative for any epileptiform activity.  She underwent right-sided thoracentesis with removal of 1800 cc of bloody fluid.  She was subsequently extubated on 08/03/2020 and transferred under [place] again on 08/04/2020.  Assessment & Plan:   Active Problems:   Acute renal failure superimposed on stage 3a chronic kidney disease (HCC)   Hyperkalemia   HTN (hypertension)   T10 vertebral fracture (HCC)   Hemothorax on right  Displaced T10 vertebral fracture post mechanical fall at home: Neurosurgery on board and now she is status post T10-11 stabilization for unstable T10 fracture on 08/01/2020.  Appreciate neurosurgery help and defer management to them.  Large exudative right pleural effusion, unclear etiology CT chest without contrast moderate to large right pleural effusion, heterogeneous density, may represent hemorrhage, collapsed lung or less likely masses.  Trauma surgery was consulted initially but there was no comment regarding this on their note. thoracentesis was ordered for her on 08/02/2020 however before having procedure was started in the IR, patient had acute change of mental status and acute hypoxic respiratory failure for which rapid response was called.  Patient was unable to protect her airway so had to be intubated.  Status postthoracentesis with removal of 1800 cc of bloody fluid on 08/03/2020.  This is consistent with exudative fluid.  Pleural fluid is showing abundant WBC, predominantly PMNs but no organisms.  Will check procalcitonin.  Acute hypoxic respiratory failure: Needed intubation and then extubated on  08/03/2020.  Currently on 4 L of oxygen.  sudden unresponsiveness with stiffening of BL upper extremities and starring off. CTH with no ICH or large stroke, CTA with no LVO. Loaded with Keppra 2G IV.  EEG is suggestive of moderate diffuse  encephalopathy, nonspecific etiology but likely related to sedation.No seizures or epileptiform discharges were seen throughout the recording.  Neurology exam is normal.  She has been taken off of antiseizure medications.  AKI on CKD stage IIIa: Her baseline creatinine seems to be around 1.4.  Trend has been 2.68>2.42> 2.04> 1.59> 1.57.  Very close to her baseline.  Continue to avoid nephrotoxic agent.  Acute on chronic diastolic congestive heart failure: Patient has history of grade 1 diastolic dysfunction.  Patient has improved since started on IV Lasix twice daily yesterday.  Unfortunately, despite our strict I's and O's orders, output is not charted well.  She has had 1 L of output just this morning according to the nurse.  Very minimal crackles at the left base.  Has been weaned down to 4 L of oxygen from nonrebreather yesterday.  We will continue IV Lasix again today and reassess tomorrow.  Hyperkalemia: Resolved  Leukocytosis, likely reactive in the setting of fracture, improving.  She is afebrile.  Doubt infection.  Improving.  Monitor.  Essential hypertension: Blood pressure stable.  Continue beta-blocker and clonidine.  Heterogeneously enlarged right lobe of the thyroid gland with scattered curvilinear calcifications.  TSH within normal range.  Ultrasound shows 1 nodule that meets criteria for FNA.  Not urgent at this point in time.  We will see if this can be done as inpatient once she is more stable.  Otherwise can be done as outpatient.  Type 2 diabetes mellitus: Blood sugar very well controlled.  Continue SSI.  Nausea and vomiting: Resolved.  DVT prophylaxis: SCDs Start: 08/02/20 0750   Code Status: Full Code  Family Communication: None present at bedside.  Plan of care discussed with patient in length and he verbalized understanding and agreed with it.  Status is: Inpatient  Remains inpatient appropriate because:Inpatient level of care appropriate due to severity of  illness   Dispo:  Patient From: Home  Planned Disposition: Home  Medically stable for discharge: No          Estimated body mass index is 32.95 kg/m as calculated from the following:   Height as of this encounter: _0  (1.6 m).   Weight as of this encounter: 84.4 kg.      Nutritional status:               Consultants:   Trauma surgery  Neurosurgery  Neurology  Procedures:   As above  Antimicrobials:  Anti-infectives (From admission, onward)   None         Subjective: Seen and examined.  Only on 4 L of nasal cannula oxygen now and she feels much better.  Denied any shortness of breath.  Objective: Vitals:   08/05/20 0015 08/05/20 0308 08/05/20 0400 08/05/20 0705  BP:   (!) 99/52 (!) 107/53  Pulse: 73  67 69  Resp: _1 Temp:  98.2 F (36.8 C)  97.9 F (36.6 C)  TempSrc:  Oral  Oral  SpO2: 92%  96% 96%  Weight:      Height:        Intake/Output Summary (Last 24 hours) at 08/05/2020 1133 Last data filed at 08/05/2020 1055 Gross per 24 hour  Intake 420 ml  Output 1000  ml  Net -580 ml   Filed Weights   07/31/20 1708  Weight: 84.4 kg    Examination: General exam: Appears calm and comfortable  Respiratory system: Crackles at the left base. Respiratory effort normal. Cardiovascular system: S1 & S2 heard, RRR. No JVD, murmurs, rubs, gallops or clicks. No pedal edema. Gastrointestinal system: Abdomen is nondistended, soft and nontender. No organomegaly or masses felt. Normal bowel sounds heard. Central nervous system: Alert and oriented. No focal neurological deficits. Extremities: Symmetric 5 x 5 power. Skin: No rashes, lesions or ulcers.  Psychiatry: Judgement and insight appear normal. Mood & affect appropriate.   Data Reviewed: I have personally reviewed following labs and imaging studies  CBC: Recent Labs  Lab 08/01/20 1549 08/02/20 0603 08/02/20 1702 08/02/20 1709 08/03/20 0804 08/05/20 0141  WBC 15.1* 14.9*  --   22.7* 15.9* 13.1*  NEUTROABS  --   --   --   --   --  9.6*  HGB 10.4* 8.7* 8.5* 9.5* 8.8* 8.7*  HCT 31.4* 26.8* 25.0* 28.8* 27.9* 27.9*  MCV 86.5 85.1  --  85.5 87.5 88.9  PLT 163 160  --  160 151 491   Basic Metabolic Panel: Recent Labs  Lab 08/01/20 0559 08/01/20 1226 08/01/20 1336 08/02/20 0603 08/02/20 1702 08/03/20 0804 08/04/20 0425 08/05/20 0141  NA 137 140   < > 138 141 139 141 141  K 5.6* 4.5   < > 4.8 4.4 4.2 4.2 4.4  CL 107 109  --  110  --  111 115* 112*  CO2 22  --   --  20*  --  20* 21* 24  GLUCOSE 194* 156*  --  141*  --  76 101* 102*  BUN 68* 81*  --  67*  --  72* 60* 58*  CREATININE 2.68* 2.60*  --  2.42*  --  2.04* 1.59* 1.57*  CALCIUM 8.7*  --   --  8.1*  --  7.7* 8.0* 8.1*  MG 1.9  --   --   --   --  2.0  --   --   PHOS 6.4*  --   --   --   --   --   --   --    < > = values in this interval not displayed.   GFR: Estimated Creatinine Clearance: 32.8 mL/min (A) (by C-G formula based on SCr of 1.57 mg/dL (H)). Liver Function Tests: Recent Labs  Lab 08/01/20 0559 08/03/20 1159  AST 21  --   ALT 15  --   ALKPHOS 86  --   BILITOT 0.7  --   PROT 6.4*  --   ALBUMIN 2.7* 2.4*   No results for input(s): LIPASE, AMYLASE in the last 168 hours. No results for input(s): AMMONIA in the last 168 hours. Coagulation Profile: No results for input(s): INR, PROTIME in the last 168 hours. Cardiac Enzymes: No results for input(s): CKTOTAL, CKMB, CKMBINDEX, TROPONINI in the last 168 hours. BNP (last 3 results) No results for input(s): PROBNP in the last 8760 hours. HbA1C: No results for input(s): HGBA1C in the last 72 hours. CBG: Recent Labs  Lab 08/04/20 1133 08/04/20 1924 08/04/20 2324 08/05/20 0316 08/05/20 0822  GLUCAP 166* 131* 111* 90 88   Lipid Profile: Recent Labs    08/03/20 0804  TRIG 231*   Thyroid Function Tests: No results for input(s): TSH, T4TOTAL, FREET4, T3FREE, THYROIDAB in the last 72 hours. Anemia Panel: No results for  input(s): VITAMINB12,  FOLATE, FERRITIN, TIBC, IRON, RETICCTPCT in the last 72 hours. Sepsis Labs: Recent Labs  Lab 08/01/20 0018  LATICACIDVEN 1.7    Recent Results (from the past 240 hour(s))  Resp Panel by RT-PCR (Flu A&B, Covid) Nasopharyngeal Swab     Status: None   Collection Time: 07/31/20 11:08 PM   Specimen: Nasopharyngeal Swab; Nasopharyngeal(NP) swabs in vial transport medium  Result Value Ref Range Status   SARS Coronavirus 2 by RT PCR NEGATIVE NEGATIVE Final    Comment: (NOTE) SARS-CoV-2 target nucleic acids are NOT DETECTED.  The SARS-CoV-2 RNA is generally detectable in upper respiratory specimens during the acute phase of infection. The lowest concentration of SARS-CoV-2 viral copies this assay can detect is 138 copies/mL. A negative result does not preclude SARS-Cov-2 infection and should not be used as the sole basis for treatment or other patient management decisions. A negative result may occur with  improper specimen collection/handling, submission of specimen other than nasopharyngeal swab, presence of viral mutation(s) within the areas targeted by this assay, and inadequate number of viral copies(<138 copies/mL). A negative result must be combined with clinical observations, patient history, and epidemiological information. The expected result is Negative.  Fact Sheet for Patients:  EntrepreneurPulse.com.au  Fact Sheet for Healthcare Providers:  IncredibleEmployment.be  This test is no t yet approved or cleared by the [place] FDA and  has been authorized for detection and/or diagnosis of SARS-CoV-2 by FDA under an Emergency Use Authorization (EUA). This EUA will remain  in effect (meaning this test can be used) for the duration of the COVID-19 declaration under Section 564(b)(1) of the Act, 21 U.S.C.section 360bbb-3(b)(1), unless the authorization is terminated  or revoked sooner.       Influenza A by PCR  NEGATIVE NEGATIVE Final   Influenza B by PCR NEGATIVE NEGATIVE Final    Comment: (NOTE) The Xpert Xpress SARS-CoV-2/FLU/RSV plus assay is intended as an aid in the diagnosis of influenza from Nasopharyngeal swab specimens and should not be used as a sole basis for treatment. Nasal washings and aspirates are unacceptable for Xpert Xpress SARS-CoV-2/FLU/RSV testing.  Fact Sheet for Patients: EntrepreneurPulse.com.au  Fact Sheet for Healthcare Providers: IncredibleEmployment.be  This test is not yet approved or cleared by the [place] FDA and has been authorized for detection and/or diagnosis of SARS-CoV-2 by FDA under an Emergency Use Authorization (EUA). This EUA will remain in effect (meaning this test can be used) for the duration of the COVID-19 declaration under Section 564(b)(1) of the Act, 21 U.S.C. section 360bbb-3(b)(1), unless the authorization is terminated or revoked.  Performed at [facility], [address], [place], [place] [address]   Culture, blood (Routine X 2) w Reflex to ID Panel     Status: None (Preliminary result)   Collection Time: 08/01/20 12:05 AM   Specimen: BLOOD  Result Value Ref Range Status   Specimen Description   Final    BLOOD RIGHT ANTECUBITAL Performed at [facility], [address], [place], [place] [address]    Special Requests   Final    NONE Performed at [facility], [address] [place], [place] [address]    Culture   Final    NO GROWTH 1 DAY Performed at [facility], [place] [address], [place], [place] [address]    Report Status PENDING  Incomplete  Culture, blood (Routine X 2) w Reflex to ID Panel     Status: None (Preliminary result)   Collection Time: 08/01/20 12:10  AM   Specimen: BLOOD  Result Value Ref Range Status   Specimen Description   Final    BLOOD LEFT ANTECUBITAL Performed at [facility],  [address] [place], [place] [address]    Special Requests   Final    NONE Performed at [facility], [address], [place], [place] [address]    Culture   Final    NO GROWTH 1 DAY Performed at [facility], [place] [address] [employer] [employer], [place] [address]    Report Status PENDING  Incomplete  MRSA PCR Screening     Status: None   Collection Time: 08/01/20  2:59 AM   Specimen: Nasal Mucosa; Nasopharyngeal  Result Value Ref Range Status   MRSA by PCR NEGATIVE NEGATIVE Final    Comment:        The GeneXpert MRSA Assay (FDA approved for NASAL specimens only), is one component of a comprehensive MRSA colonization surveillance program. It is not intended to diagnose MRSA infection nor to guide or monitor treatment for MRSA infections. Performed at [facility], [place] [address] [place], [place] [address]   Body fluid culture w Gram Stain     Status: None (Preliminary result)   Collection Time: 08/03/20 11:30 AM   Specimen: Fluid  Result Value Ref Range Status   Specimen Description FLUID RIGHT PLEURAL  Final   Special Requests NONE  Final   Gram Stain   Final    ABUNDANT WBC PRESENT, PREDOMINANTLY PMN NO ORGANISMS SEEN    Culture   Final    NO GROWTH 2 DAYS Performed at [facility], [place] [address], [place], [place] [address]    Report Status PENDING  Incomplete      Radiology Studies: MR BRAIN WO CONTRAST  Result Date: 08/04/2020 CLINICAL DATA:  Seizure with abnormal mental status EXAM: MRI HEAD WITHOUT CONTRAST TECHNIQUE: Multiplanar, multiecho pulse sequences of the brain and surrounding structures were obtained without intravenous contrast. COMPARISON:  CT and CTA from 2 days ago FINDINGS: Brain: No acute infarction, hemorrhage, hydrocephalus, extra-axial collection or mass lesion. Extensive low-density in the cerebral white matter attributed to chronic small vessel ischemia. There is both gliosis and scattered white matter  lacunes. Cystic changes at the bilateral globus pallidus which may reflect remote insult. Preserved brain volume for age. Vascular: Preserved flow voids Skull and upper cervical spine: Normal marrow signal. 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 Artifact in the scalp where there are skin staples by prior CT. Sinuses/Orbits: No acute finding Other: Truncated and motion degraded study. The patient was stopped early due to hypoxia. IMPRESSION: 1. No acute finding. 2. Extensive chronic small vessel disease. 3. Motion degraded and truncated study due to patient condition. Electronically Signed   By: Monte Fantasia M.D.   On: 08/04/2020 09:58   DG Chest Port 1 View  Result Date: 08/04/2020 CLINICAL DATA:  Evaluate respiratory distress EXAM: PORTABLE CHEST 1 VIEW COMPARISON:  Yesterday FINDINGS: Cardiomegaly, indistinct hila, and diffuse interstitial and hazy airspace opacity. There are likely small pleural effusions. No pneumothorax. Postoperative thoracic spine. IMPRESSION: CHF. Electronically Signed   By: Monte Fantasia M.D.   On: 08/04/2020 10:42   DG CHEST PORT 1 VIEW  Result Date: 08/03/2020 CLINICAL DATA:  Status post right thoracentesis EXAM: PORTABLE CHEST 1 VIEW COMPARISON:  08/02/2020 FINDINGS: Improvement in the right effusion following thoracentesis. There is better aeration in the right lower lung. Negative for pneumothorax. Residual bibasilar atelectasis and left lower  lobe airspace opacity may represent pneumonia. Heart is enlarged. Postop changes from lower thoracic fusion evident. NG tube extends below the hemidiaphragms with the tip not visualized as it enters the stomach. Endotracheal tube 3 cm above the carina. IMPRESSION: Improvement in the right effusion following thoracentesis. Negative for pneumothorax. Otherwise stable portable chest exam. Electronically Signed   By: Jerilynn Mages.  Shick M.D.   On: 08/03/2020 13:32   ECHOCARDIOGRAM COMPLETE  Result Date: 08/03/2020    ECHOCARDIOGRAM  REPORT   Patient Name:   Jacqueline Richmond Date of Exam: 08/03/2020 Medical Rec #:  [id number]     Height:       63.0 in Accession #:    [id number]    Weight:       186.0 lb Date of Birth:  1945/04/01      BSA:          1.875 m Patient Age:    17 years      BP:           95/54 mmHg Patient Gender: F             HR:           89 bpm. Exam Location:  Inpatient Procedure: 2D Echo, Cardiac Doppler and Color Doppler Indications:    Pleural effusion  History:        Patient has no prior history of Echocardiogram examinations.                 Risk Factors:Diabetes, Hypertension and Dyslipidemia.  Sonographer:    Luisa Hart RDCS Referring Phys: [place]  1. Left ventricular ejection fraction, by estimation, is 55 to 60%. The left ventricle has normal function. The left ventricle has no regional wall motion abnormalities. Left ventricular diastolic parameters are consistent with Grade I diastolic dysfunction (impaired relaxation).  2. Right ventricular systolic function is normal. The right ventricular size is normal. There is normal pulmonary artery systolic pressure. The estimated right ventricular systolic pressure is 34.2 mmHg.  3. The mitral valve is normal in structure. Trivial mitral valve regurgitation. No evidence of mitral stenosis.  4. The aortic valve is tricuspid. Aortic valve regurgitation is not visualized. Mild aortic valve sclerosis is present, with no evidence of aortic valve stenosis.  5. The inferior vena cava is normal in size with greater than 50% respiratory variability, suggesting right atrial pressure of 3 mmHg. FINDINGS  Left Ventricle: Left ventricular ejection fraction, by estimation, is 55 to 60%. The left ventricle has normal function. The left ventricle has no regional wall motion abnormalities. The left ventricular internal cavity size was normal in size. There is  no left ventricular hypertrophy. Left ventricular diastolic parameters are consistent with Grade I diastolic  dysfunction (impaired relaxation). Right Ventricle: The right ventricular size is normal. No increase in right ventricular wall thickness. Right ventricular systolic function is normal. There is normal pulmonary artery systolic pressure. The tricuspid regurgitant velocity is 2.69 m/s, and  with an assumed right atrial pressure of 3 mmHg, the estimated right ventricular systolic pressure is 87.6 mmHg. Left Atrium: Left atrial size was normal in size. Right Atrium: Right atrial size was normal in size. Pericardium: Trivial pericardial effusion is present. Mitral Valve: The mitral valve is normal in structure. Mild mitral annular calcification. Trivial mitral valve regurgitation. No evidence of mitral valve stenosis. Tricuspid Valve: The tricuspid valve is normal in structure. Tricuspid valve regurgitation is trivial. Aortic Valve: The aortic valve is tricuspid. Aortic valve  regurgitation is not visualized. Mild aortic valve sclerosis is present, with no evidence of aortic valve stenosis. Aortic valve mean gradient measures 3.0 mmHg. Aortic valve peak gradient measures 6.2 mmHg. Aortic valve area, by VTI measures 2.57 cm. Pulmonic Valve: The pulmonic valve was normal in structure. Pulmonic valve regurgitation is trivial. Aorta: The aortic root is normal in size and structure. Venous: The inferior vena cava is normal in size with greater than 50% respiratory variability, suggesting right atrial pressure of 3 mmHg. IAS/Shunts: No atrial level shunt detected by color flow Doppler.  LEFT VENTRICLE PLAX 2D LVIDd:         5.70 cm     Diastology LVIDs:         2.90 cm     LV e' medial:    5.87 cm/s LV PW:         1.00 cm     LV E/e' medial:  20.4 LV IVS:        0.80 cm     LV e' lateral:   6.53 cm/s LVOT diam:     2.00 cm     LV E/e' lateral: 18.4 LV SV:         68 LV SV Index:   36 LVOT Area:     3.14 cm  LV Volumes (MOD) LV vol d, MOD A2C: 55.0 ml LV vol d, MOD A4C: 68.9 ml LV vol s, MOD A2C: 22.4 ml LV vol s, MOD A4C:  26.1 ml LV SV MOD A2C:     32.6 ml LV SV MOD A4C:     68.9 ml LV SV MOD BP:      40.3 ml RIGHT VENTRICLE RV S prime:     11.00 cm/s TAPSE (M-mode): 2.6 cm LEFT ATRIUM           Index       RIGHT ATRIUM           Index LA diam:      4.00 cm 2.13 cm/m  RA Area:     11.70 cm LA Vol (A4C): 41.4 ml 22.08 ml/m RA Volume:   25.60 ml  13.65 ml/m  AORTIC VALVE                   PULMONIC VALVE AV Area (Vmax):    2.30 cm    PV Vmax:       1.06 m/s AV Area (Vmean):   2.52 cm    PV Vmean:      80.000 cm/s AV Area (VTI):     2.57 cm    PV VTI:        0.210 m AV Vmax:           125.00 cm/s PV Peak grad:  4.5 mmHg AV Vmean:          84.600 cm/s PV Mean grad:  3.0 mmHg AV VTI:            0.264 m AV Peak Grad:      6.2 mmHg AV Mean Grad:      3.0 mmHg LVOT Vmax:         91.70 cm/s LVOT Vmean:        67.800 cm/s LVOT VTI:          0.216 m LVOT/AV VTI ratio: 0.82  AORTA Ao Root diam: 3.40 cm Ao Asc diam:  3.50 cm MITRAL VALVE  TRICUSPID VALVE MV Area (PHT): 2.66 cm     TR Peak grad:   28.9 mmHg MV Decel Time: 285 msec     TR Vmax:        269.00 cm/s MR Peak grad: 104.9 mmHg MR Mean grad: 70.0 mmHg     SHUNTS MR Vmax:      512.00 cm/s   Systemic VTI:  0.22 m MR Vmean:     397.0 cm/s    Systemic Diam: 2.00 cm MV E velocity: 120.00 cm/s MV A velocity: 118.00 cm/s MV E/A ratio:  1.02 Loralie Champagne MD Electronically signed by Loralie Champagne MD Signature Date/Time: 08/03/2020/3:44:06 PM    Final     Scheduled Meds: . atenolol  100 mg Oral Daily  . chlorhexidine gluconate (MEDLINE KIT)  15 mL Mouth Rinse BID  . Chlorhexidine Gluconate Cloth  6 each Topical Daily  . cloNIDine  0.2 mg Oral BID  . dextromethorphan-guaiFENesin  1 tablet Oral BID  . furosemide  40 mg Intravenous BID  . insulin aspart  0-9 Units Subcutaneous Q4H  . polyethylene glycol  17 g Oral Daily  . senna-docusate  2 tablet Oral QHS   Continuous Infusions:    LOS: 4 days   Time spent: 30 minutes   Darliss Cheney, MD [facility]  08/05/2020, 11:33 AM   How to contact the [facility] Attending or Consulting provider [place] or covering provider during after hours [place], for this patient?  1. Check the care team in [facility] and look for a) attending/consulting [facility] provider listed and b) the [facility] team listed. Page or secure chat [phone]. 2. Log into [email or web address] and use [place]'s universal password to access. If you do not have the password, please contact the hospital operator. 3. Locate the [facility] provider you are looking for under [facility] and page to a number that you can be directly reached. 4. If you still have difficulty reaching the provider, please page the [facility] (Director on Call) for the Hospitalists listed on amion for assistance.

## 2020-08-05 NOTE — Progress Notes (Signed)
Denies significant back or leg pain.  Moving her legs well in bed.  Continue current management continue to mobilize in her brace

## 2020-08-05 NOTE — Progress Notes (Signed)
NEUROLOGY CONSULTATION PROGRESS NOTE   Date of service: Aug 04, 2020 Patient Name: Jacqueline Richmond MRN:  [id number] DOB:  1945-07-07  Brief HPI   Jacqueline Richmond is a 75 y.o. female with PMH significant for CKD, HTN, Gout, HTN, DM2, HLd who is admitted with T spine fx s/p fall who had sudden unresponsiveness with stiffening of BL upper extremities and starring off. Intubated in the ED and CTH with no ICH or large stroke, CTA with no LVO. Loaded with Keppra 2G IV once in the ED. Admitted to the ICU after intubation and cEEG was negative for seizure. MRI brain is negative for any acute abnormality.   Interval Hx   Gets short of breath easily and speaks in short phrases or words only. Reports she fell like she passed out when she had that event.  Vitals   Vitals:   08/05/20 0015 08/05/20 0308 08/05/20 0400 08/05/20 0705  BP:   (!) 99/52 (!) 107/53  Pulse: 73  67 69  Resp: 18  17 15   Temp:  98.2 F (36.8 C)  97.9 F (36.6 C)  TempSrc:  Oral  Oral  SpO2: 92%  96% 96%  Weight:      Height:         Body mass index is 32.95 kg/m.  Physical Exam   General: Laying comfortably in bed; mildly tachypneic. Neck: Supple, no pain or tenderness  CV: No JVD. No peripheral edema.  Pulmonary: Symmetric Chest rise. NTachypnea Abdomen: Soft to touch, non-tender.  Ext: No cyanosis, edema, or deformity  Skin: No rash. Normal palpation of skin.   Musculoskeletal: Normal digits and nails by inspection. No clubbing.   Neurologic Examination  Mental status/Cognition: Alert, oriented to self, place, month and year, good attention.  Speech/language: Non fluent likely due to shortness of breath, comprehension intact, object naming intact, repetition intact. Cranial nerves:   CN II Pupils equal and reactive to light, no VF deficits    CN III,IV,VI EOM intact, no gaze preference or deviation, no nystagmus    CN V normal sensation in V1, V2, and V3 segments bilaterally    CN VII no asymmetry, no  nasolabial fold flattening    CN VIII normal hearing to speech    CN IX & X normal palatal elevation, no uvular deviation    CN XI 5/5 head turn and 5/5 shoulder shrug bilaterally    CN XII midline tongue protrusion    Motor:  Muscle bulk: normal, tone normal, pronator drift none tremor none Mvmt Root Nerve  Muscle Right Left Comments  SA C5/6 Ax Deltoid     EF C5/6 Mc Biceps 5 5   EE C6/7/8 Rad Triceps 5 5   WF C6/7 Med FCR     WE C7/8 PIN ECU     F Ab C8/T1 U ADM/FDI 5 5   HF L1/2/3 Fem Illopsoas 5 5   KE L2/3/4 Fem Quad     DF L4/5 D Peron Tib Ant 5 5   PF S1/2 Tibial Grc/Sol 5 5    Sensation:  Light touch intact   Pin prick    Temperature    Vibration   Proprioception    Coordination/Complex Motor:  - Finger to Nose intact BL  Labs   Basic Metabolic Panel:  Lab Results  Component Value Date   NA 141 08/05/2020   K 4.4 08/05/2020   CO2 24 08/05/2020   GLUCOSE 102 (H) 08/05/2020   BUN 58 (H) 08/05/2020  CREATININE 1.57 (H) 08/05/2020   CALCIUM 8.1 (L) 08/05/2020   GFRNONAA 35 (L) 08/05/2020   GFRAA 31 (L) 05/03/2012   HbA1c:  Lab Results  Component Value Date   HGBA1C 5.4 08/01/2020   LDL:  Lab Results  Component Value Date   LDLCALC 35 08/31/2011   Urine Drug Screen: No results found for: LABOPIA, COCAINSCRNUR, LABBENZ, AMPHETMU, THCU, LABBARB  Alcohol Level No results found for: ETH No results found for: PHENYTOIN, ZONISAMIDE, LAMOTRIGINE, LEVETIRACETA No results found for: PHENYTOIN, PHENOBARB, VALPROATE, CBMZ  Imaging and Diagnostic studies    MRI Brain witout ontrast: 1. No acute finding. 2. Extensive chronic small vessel disease. 3. Motion degraded and truncated study due to patient condition.    CEEG: This study is suggestive of moderate diffuse encephalopathy, nonspecific etiology but likely related to sedation.No seizures or epileptiform discharges were seen throughout the recording.   Impression   Jacqueline Richmond is a 75 y.o.  female with PMH significant for 75 y.o. female with PMH significant for CKD, HTN, Gout, HTN, DM2, HLd who is admitted with T spine fx s/p fall who had sudden unresponsiveness with stiffening of BL upper extremities and starring off. She is now back to her baseline. Workup with cEEG and MRI Brain with no seizures, no acute abnormality.  She likely just had a convulsive syncope episode.  Recommendations  - No further inpatient neurological workup at this time. - No antiepileptic medications at this time - neurology inpatient team will signoff. Will defer eval for respiratory issues to primary team. ______________________________________________________________________   Thank you for the opportunity to take part in the care of this patient. If you have any further questions, please contact the neurology consultation attending.  Signed,  [place] Pager Number [phone]

## 2020-08-06 DIAGNOSIS — S22079A Unspecified fracture of T9-T10 vertebra, initial encounter for closed fracture: Secondary | ICD-10-CM | POA: Diagnosis not present

## 2020-08-06 LAB — CBC WITH DIFFERENTIAL/PLATELET
Abs Immature Granulocytes: 0.09 10*3/uL — ABNORMAL HIGH (ref 0.00–0.07)
Basophils Absolute: 0.1 10*3/uL (ref 0.0–0.1)
Basophils Relative: 1 %
Eosinophils Absolute: 0.3 10*3/uL (ref 0.0–0.5)
Eosinophils Relative: 3 %
HCT: 27.3 % — ABNORMAL LOW (ref 36.0–46.0)
Hemoglobin: 8.8 g/dL — ABNORMAL LOW (ref 12.0–15.0)
Immature Granulocytes: 1 %
Lymphocytes Relative: 16 %
Lymphs Abs: 1.6 10*3/uL (ref 0.7–4.0)
MCH: 28.3 pg (ref 26.0–34.0)
MCHC: 32.2 g/dL (ref 30.0–36.0)
MCV: 87.8 fL (ref 80.0–100.0)
Monocytes Absolute: 0.6 10*3/uL (ref 0.1–1.0)
Monocytes Relative: 6 %
Neutro Abs: 7.2 10*3/uL (ref 1.7–7.7)
Neutrophils Relative %: 73 %
Platelets: 204 10*3/uL (ref 150–400)
RBC: 3.11 MIL/uL — ABNORMAL LOW (ref 3.87–5.11)
RDW: 15.8 % — ABNORMAL HIGH (ref 11.5–15.5)
WBC: 9.8 10*3/uL (ref 4.0–10.5)
nRBC: 0 % (ref 0.0–0.2)

## 2020-08-06 LAB — GLUCOSE, CAPILLARY
Glucose-Capillary: 100 mg/dL — ABNORMAL HIGH (ref 70–99)
Glucose-Capillary: 112 mg/dL — ABNORMAL HIGH (ref 70–99)
Glucose-Capillary: 115 mg/dL — ABNORMAL HIGH (ref 70–99)
Glucose-Capillary: 131 mg/dL — ABNORMAL HIGH (ref 70–99)
Glucose-Capillary: 150 mg/dL — ABNORMAL HIGH (ref 70–99)
Glucose-Capillary: 152 mg/dL — ABNORMAL HIGH (ref 70–99)
Glucose-Capillary: 96 mg/dL (ref 70–99)
Glucose-Capillary: 96 mg/dL (ref 70–99)

## 2020-08-06 LAB — BASIC METABOLIC PANEL
Anion gap: 8 (ref 5–15)
BUN: 62 mg/dL — ABNORMAL HIGH (ref 8–23)
CO2: 24 mmol/L (ref 22–32)
Calcium: 8.3 mg/dL — ABNORMAL LOW (ref 8.9–10.3)
Chloride: 109 mmol/L (ref 98–111)
Creatinine, Ser: 1.68 mg/dL — ABNORMAL HIGH (ref 0.44–1.00)
GFR, Estimated: 32 mL/min — ABNORMAL LOW (ref >60)
Glucose, Bld: 100 mg/dL — ABNORMAL HIGH (ref 70–99)
Potassium: 3.8 mmol/L (ref 3.5–5.1)
Sodium: 141 mmol/L (ref 135–145)

## 2020-08-06 LAB — CYTOLOGY - NON PAP

## 2020-08-06 LAB — CULTURE, BLOOD (ROUTINE X 2)
Culture: NO GROWTH
Culture: NO GROWTH

## 2020-08-06 LAB — BODY FLUID CULTURE W GRAM STAIN: Culture: NO GROWTH

## 2020-08-06 LAB — TRIGLYCERIDES: Triglycerides: 221 mg/dL — ABNORMAL HIGH (ref <150)

## 2020-08-06 MED ORDER — FUROSEMIDE 40 MG PO TABS
40.0000 mg | ORAL_TABLET | Freq: Every day | ORAL | Status: DC
  Administered 2020-08-07 – 2020-08-08 (×2): 40 mg via ORAL
  Filled 2020-08-06 (×2): qty 1

## 2020-08-06 NOTE — Consult Note (Signed)
Physical Medicine and Rehabilitation Consult   Reason for Consult:Functional deficits due to trauma.  Referring Physician: Dr. Doristine Bosworth   HPI: Jacqueline Richmond is a 75 y.o. female with history of OA, gout, T2DM w/CKD who was admitted on 07/31/20 after a fall with subsequent displaced T10 vertebral body fracture extending into T9-T10 space with paravertebral hemorrhage, posterior head laceration and moderate to large right pleural effusion. Patient with reports of severe back pain, left elbow pain as well as 1+ week of abdominal pain with diarrhea. She underwent T8-T11 stabilization on 05/11 by Dr. Kathyrn Sheriff with recommendations to wear TLSO when upright/OOB.  On 05/12, IR consulted for thoracocentesis but patient developed seizure type activity with unresponsiveness.    She was intubated for airway protection, loaded with Keppra and EEG/MRI brain done for work up. EEG showed moderate diffuse encephalopathy and MRI brain was negative for acute changes but showed extensive small vessel disease.    She underwent thoracocentesis of 1800 cc of exudative fluid on 05/13 and tolerated extubation to NRB mask later that day. Respiratory status improving and reactive leucocytosis resolving.  Neurology felt that patient with convulsive syncope episode and not further work up needed as back to baseline neurologically. therapy ongoing and patient has had difficulty standing with confusion and STM deficits. CIR recommended due to functional decline. She is agreeable to CIR.   Review of Systems  Constitutional: Negative for chills and fever.  HENT: Positive for hearing loss.   Eyes: Negative for blurred vision and double vision.  Respiratory: Negative for shortness of breath and wheezing.   Cardiovascular: Positive for leg swelling. Negative for chest pain and palpitations.  Gastrointestinal: Negative for heartburn and nausea.  Genitourinary: Negative for dysuria and urgency.  Musculoskeletal: Positive for  falls and joint pain (bilateral shoulders and bilateral knees).  Skin: Negative for rash.  Neurological: Positive for dizziness (with transfers today) and weakness. Negative for headaches.  Psychiatric/Behavioral: Negative for memory loss.      Past Medical History:  Diagnosis Date  . Allergy   . Anemia   . Arthritis   . Cataract    biltaerally   . CKD (chronic kidney disease) stage 3, GFR 30-59 ml/min (HCC) 08/29/2011   per patient, kidneys monitored by Dr Posey Pronto endocrinologist ,lov was  05-18-2018, "kidney fx stable"  . Colon polyp   . Complication of anesthesia   . Difficult intubation    small mouth opening  . Diverticulosis   . Gout   . HTN (hypertension) 08/29/2011  . Hyperlipemia   . Hypertension   . Psoriasis   . Type II or unspecified type diabetes mellitus without mention of complication, not stated as uncontrolled 08/29/2011    Past Surgical History:  Procedure Laterality Date  . BREAST LUMPECTOMY WITH NEEDLE LOCALIZATION Right 05/07/2012   Procedure: BREAST LUMPECTOMY WITH NEEDLE LOCALIZATION;  Surgeon: Merrie Roof, MD;  Location: [facility];  Service: General;  Laterality: Right;  . CHOLECYSTECTOMY    . COLONOSCOPY  2009   Ardis Hughs   . COLONOSCOPY WITH PROPOFOL N/A 05/20/2018   Procedure: COLONOSCOPY WITH PROPOFOL;  Surgeon: Milus Banister, MD;  Location: [facility];  Service: Endoscopy;  Laterality: N/A;  . HERNIA REPAIR    . HIP SURGERY    . LAMINECTOMY WITH POSTERIOR LATERAL ARTHRODESIS LEVEL 4 N/A 08/01/2020   Procedure: LAMINECTOMY WITH POSTERIOR LATERAL ARTHRODESIS Thoracic nine- Thoracic eleven ;  Surgeon: Consuella Lose, MD;  Location: [facility];  Service: Neurosurgery;  Laterality:  N/A;  . POLYPECTOMY  05/20/2018   Procedure: POLYPECTOMY;  Surgeon: Milus Banister, MD;  Location: [facility];  Service: Endoscopy;;  . RHINOPLASTY    . SALPINGOOPHORECTOMY    . TOTAL HIP ARTHROPLASTY Right 2004    Family History  Problem Relation Age of Onset  . Heart  disease Father   . Arthritis Sister   . Colon cancer Neg Hx   . Colon polyps Neg Hx   . Esophageal cancer Neg Hx   . Stomach cancer Neg Hx   . Rectal cancer Neg Hx     Social History:  Married. 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 Independent without AD. She  reports that she has never smoked. She has never used smokeless tobacco. She reports current alcohol use--occasionally.  She reports that she does not use drugs.   Allergies  Allergen Reactions  . Codeine Itching  . Iodinated Diagnostic Agents     Other reaction(s): Unknown  . Pentazocine Other (See Comments)    Hallucinations  . Statins     Medications Prior to Admission  Medication Sig Dispense Refill  . allopurinol (ZYLOPRIM) 300 MG tablet Take 1 tablet (300 mg total) by mouth daily. 90 tablet 1  . aspirin EC 81 MG tablet Take 81 mg by mouth daily. Reported on 06/19/2015    . atenolol (TENORMIN) 100 MG tablet Take 100 mg by mouth daily.    Marland Kitchen b complex vitamins tablet Take 1 tablet by mouth daily.    Marland Kitchen CINNAMON PO Take 2,000 mg by mouth 2 (two) times daily.    . cloNIDine (CATAPRES) 0.3 MG tablet Take 0.3 mg by mouth 2 (two) times daily.    . furosemide (LASIX) 40 MG tablet Take 40 mg by mouth daily.     . Garlic 3536 MG CAPS Take 2,000 mg by mouth 2 (two) times daily.    Marland Kitchen glipiZIDE (GLUCOTROL XL) 10 MG 24 hr tablet Take 10 mg by mouth daily with breakfast.    . KERENDIA 10 MG TABS Take 10 mg by mouth daily.    Marland Kitchen loratadine (CLARITIN) 10 MG tablet Take 10 mg by mouth daily.    . niacin 500 MG tablet Take 500 mg by mouth 2 (two) times daily with a meal. Reported on 06/19/2015    . Omega-3 1000 MG CAPS Take 1,000 mg by mouth 2 (two) times daily.    . sitaGLIPtin (JANUVIA) 50 MG tablet Take 50 mg by mouth daily.     Marland Kitchen doxycycline (VIBRA-TABS) 100 MG tablet Take 1 tablet (100 mg total) by mouth 2 (two) times daily. (Patient not taking: No sig reported) 28 tablet 0  . fenofibrate 54 MG tablet Take 1 tablet (54  mg total) by mouth at bedtime. (Patient not taking: Reported on 08/01/2020) 30 tablet 0    Home: [place] expects to be discharged to:: Private residence Living Arrangements: Spouse/significant other Available Help at Discharge: Family,Available 24 hours/day Type of Home: House Home Access: Stairs to enter,Level entry Entrance Stairs-Number of Steps: level entry through garage, 3 steps to enter front door Entrance Stairs-Rails: None Home Layout: Two level,Able to live on main level with bedroom/bathroom Bathroom Shower/Tub: Chiropodist: Handicapped height Bathroom Accessibility: Yes Home Equipment: Walker - 4 wheels,Bedside commode Additional Comments: pt has plans to convert her tub shower into a walk in shower  Functional History: Prior Function Level of Independence: Independent Functional Status:  Mobility: Bed Mobility Overal bed mobility: Needs Assistance  Bed Mobility: Rolling,Sidelying to Sit Rolling: Mod assist Sidelying to sit: Mod assist Sit to sidelying: Mod assist General bed mobility comments: Heavy mod assist to roll to the right and come up to sitting with HOB mildly elevated.  Cues for log roll technique. Transfers Overall transfer level: Needs assistance Equipment used: Rolling walker (2 wheeled) Transfer via Lift Equipment: Stedy Transfers: Sit to/from [employer] to Stand: Mod assist,From elevated surface Stand pivot transfers: From elevated surface,Max assist (seated on steady) General transfer comment: Pt stood x3 once from elevated bed to RW flexed at trunk and knees, so I did not feel safe transferring, and once from bed to steady and once from steady to chair.  All involved mod assist to power trunk up over weak legs, but steady was much safer for transfer than RW appeared to be. Ambulation/Gait General Gait Details: unable to yet    ADL: ADL Overall ADL's : Needs  assistance/impaired Eating/Feeding: Independent,Sitting Grooming: Set up,Sitting Upper Body Bathing: Minimal assistance,Sitting Lower Body Bathing: Moderate assistance,Sit to/from stand Upper Body Dressing : Total assistance Upper Body Dressing Details (indicate cue type and reason): Total A for donning TLSO Lower Body Dressing: Maximal assistance,Sit to/from stand Toilet Transfer: Moderate assistance,BSC,RW,Stand-pivot Toileting- Clothing Manipulation and Hygiene: Sit to/from stand,Maximal assistance Functional mobility during ADLs: Moderate assistance,Rolling walker General ADL Comments: seemingly confused this session, requries step by step verbal cues  Cognition: Cognition Overall Cognitive Status: Impaired/Different from baseline Orientation Level: Oriented X4 Cognition Arousal/Alertness: Awake/alert Behavior During Therapy: WFL for tasks assessed/performed Overall Cognitive Status: Impaired/Different from baseline Area of Impairment: Memory Memory: Decreased short-term memory,Decreased recall of precautions Safety/Judgement: Decreased awareness of safety,Decreased awareness of deficits Problem Solving: Requires verbal cues,Difficulty sequencing,Requires tactile cues,Slow processing General Comments: Def some memory deficits, does not remember daughter being there a few days ago, doesn't remember where she is.  Just a bit fuzzy.   Blood pressure 138/63, pulse 62, temperature 97.8 F (36.6 C), temperature source Oral, resp. rate 17, height 5\' 3"  (1.6 m), weight 84.4 kg, SpO2 95 %. Physical Exam Gen: no distress, normal appearing HEENT: oral mucosa pink and moist, hard of hearing Cardio: Reg rate Chest: normal effort, normal rate of breathing Abd: soft, non-distended Ext: no edema Psych: pleasant, normal affect Skin: IV in place, spinal incision C/D/I with staples in place.   Neurological/MSK    Mental Status: She is alert.     Comments: HOH. She was able to follow simple  motor commands. Sitting in chair. 5/5 strength throughout   Results for orders placed or performed during the hospital encounter of 07/31/20 (from the past 24 hour(s))  Glucose, capillary     Status: None   Collection Time: 08/05/20 12:17 PM  Result Value Ref Range   Glucose-Capillary 95 70 - 99 mg/dL  Glucose, capillary     Status: Abnormal   Collection Time: 08/05/20  3:54 PM  Result Value Ref Range   Glucose-Capillary 111 (H) 70 - 99 mg/dL  Glucose, capillary     Status: Abnormal   Collection Time: 08/05/20  7:26 PM  Result Value Ref Range   Glucose-Capillary 137 (H) 70 - 99 mg/dL  Glucose, capillary     Status: Abnormal   Collection Time: 08/05/20  8:58 PM  Result Value Ref Range   Glucose-Capillary 128 (H) 70 - 99 mg/dL  Glucose, capillary     Status: Abnormal   Collection Time: 08/05/20 11:11 PM  Result Value Ref Range   Glucose-Capillary 122 (H) 70 -  99 mg/dL  Glucose, capillary     Status: Abnormal   Collection Time: 08/05/20 11:51 PM  Result Value Ref Range   Glucose-Capillary 125 (H) 70 - 99 mg/dL  Glucose, capillary     Status: None   Collection Time: 08/06/20  3:32 AM  Result Value Ref Range   Glucose-Capillary 96 70 - 99 mg/dL  Triglycerides     Status: Abnormal   Collection Time: 08/06/20  6:52 AM  Result Value Ref Range   Triglycerides 221 (H) <150 mg/dL  Basic metabolic panel     Status: Abnormal   Collection Time: 08/06/20  6:52 AM  Result Value Ref Range   Sodium 141 135 - 145 mmol/L   Potassium 3.8 3.5 - 5.1 mmol/L   Chloride 109 98 - 111 mmol/L   CO2 24 22 - 32 mmol/L   Glucose, Bld 100 (H) 70 - 99 mg/dL   BUN 62 (H) 8 - 23 mg/dL   Creatinine, Ser 1.68 (H) 0.44 - 1.00 mg/dL   Calcium 8.3 (L) 8.9 - 10.3 mg/dL   GFR, Estimated 32 (L) >60 mL/min   Anion gap 8 5 - 15  CBC with Differential/Platelet     Status: Abnormal   Collection Time: 08/06/20  6:52 AM  Result Value Ref Range   WBC 9.8 4.0 - 10.5 K/uL   RBC 3.11 (L) 3.87 - 5.11 MIL/uL    Hemoglobin 8.8 (L) 12.0 - 15.0 g/dL   HCT 27.3 (L) 36.0 - 46.0 %   MCV 87.8 80.0 - 100.0 fL   MCH 28.3 26.0 - 34.0 pg   MCHC 32.2 30.0 - 36.0 g/dL   RDW 15.8 (H) 11.5 - 15.5 %   Platelets 204 150 - 400 K/uL   nRBC 0.0 0.0 - 0.2 %   Neutrophils Relative % 73 %   Neutro Abs 7.2 1.7 - 7.7 K/uL   Lymphocytes Relative 16 %   Lymphs Abs 1.6 0.7 - 4.0 K/uL   Monocytes Relative 6 %   Monocytes Absolute 0.6 0.1 - 1.0 K/uL   Eosinophils Relative 3 %   Eosinophils Absolute 0.3 0.0 - 0.5 K/uL   Basophils Relative 1 %   Basophils Absolute 0.1 0.0 - 0.1 K/uL   Immature Granulocytes 1 %   Abs Immature Granulocytes 0.09 (H) 0.00 - 0.07 K/uL  Glucose, capillary     Status: Abnormal   Collection Time: 08/06/20  7:39 AM  Result Value Ref Range   Glucose-Capillary 100 (H) 70 - 99 mg/dL   No results found.   Assessment/Plan: Diagnosis: s/p T8-T11 stabilization for unstable T10 fracture 1. Does the need for close, 24 hr/day medical supervision in concert with the patient's rehab needs make it unreasonable for this patient to be served in a less intensive setting? Yes 2. Co-Morbidities requiring supervision/potential complications:  1. Obesity BMI 32.95 2. Hyperkalemia: monitor K+ TID 3. HTN: monitor BP TID 4. Right sided hemothorax: monitor RR TID 5. Acute hypoxic respiratory failure 3. Due to bladder management, bowel management, safety, skin/wound care, disease management, medication administration, pain management and patient education, does the patient require 24 hr/day rehab nursing? Yes 4. Does the patient require coordinated care of a physician, rehab nurse, therapy disciplines of PT, OT to address physical and functional deficits in the context of the above medical diagnosis(es)? Yes Addressing deficits in the following areas: balance, endurance, locomotion, strength, transferring, bowel/bladder control, bathing, dressing, feeding, grooming, toileting and psychosocial support 5. Can the  patient actively participate in  an intensive therapy program of at least 3 hrs of therapy per day at least 5 days per week? Yes 6. The potential for patient to make measurable gains while on inpatient rehab is excellent 7. Anticipated functional outcomes upon discharge from inpatient rehab are min assist  with PT, min assist with OT, independent with SLP. 8. Estimated rehab length of stay to reach the above functional goals is: 10-14 days 9. Anticipated discharge destination: Home 10. Overall Rehab/Functional Prognosis: excellent  RECOMMENDATIONS: This patient's condition is appropriate for continued rehabilitative care in the following setting: CIR Patient has agreed to participate in recommended program. Yes Note that insurance prior authorization may be required for reimbursement for recommended care.  Comment: Thank you for this consult. Admission coordinator to follow.   I have personally performed a face to face diagnostic evaluation, including, but not limited to relevant history and physical exam findings, of this patient and developed relevant assessment and plan.  Additionally, I have reviewed and concur with the physician assistant's documentation above.  Leeroy Cha, MD  Bary Leriche, PA-C 08/06/2020

## 2020-08-06 NOTE — Progress Notes (Signed)
Occupational Therapy Treatment Patient Details Name: Jacqueline Richmond MRN: [id number] DOB: 1945/11/17 Today's Date: 08/06/2020    History of present illness 75 yo female presents to [facility] on 5/10 s/p fall down front brink steps with + head trauma. Pt sustained T10 fx with extension into T9-10 disc space, R pleural effusion. CTH shows L parietal scalp hematoma with no acute intracranial abnormality noted. s/p T8-11 stabilization for unstable T10 fracture on 5/11. Rapid response 5/12 requiring intubation (extubated 5/13) due to unresponsiveness and hypoxia, concern for seizures.  Pt with another rapid response event on 5/14 in MRI where she had to be placed on NRB due to low sats and difficulty breathing.  PMH includes type 2 diabetes, essential hypertension, CKD 4,  HLD, cataracts, R THA 2004.   OT comments  Pt progressing to OOB ADL and mobility with RW. Pt taking steps and ambulating a short distance x2 times with chair follow. Pt transferring to [facility] with minA +2 and maxA for peri care. Pt limited by decreased strength, decreased ability to care for self and decreased alertness to situation. Pt requiring increased time for word retrieval and unsure if pt is [facility] or has poor attention. Pt motivated to return to PLOF. RN aware of nausea and pain. Pt would benefit from continued OT skilled services. OT following acutely.    Follow Up Recommendations  CIR;Supervision/Assistance - 24 hour    Equipment Recommendations  Tub/shower bench    Recommendations for Other Services      Precautions / Restrictions Precautions Precautions: Fall;Back Precaution Booklet Issued: Yes (comment) Precaution Comments: reviewed BLT; pt stating 2/3 Required Braces or Orthoses: Spinal Brace Spinal Brace: Thoracolumbosacral orthotic;Applied in sitting position;Other (comment) (repositioned in standing) Restrictions Weight Bearing Restrictions: No       Mobility Bed Mobility               General bed mobility  comments: in recliner pre and post session    Transfers Overall transfer level: Needs assistance Equipment used: Rolling walker (2 wheeled) Transfers: Sit to/from [facility] Sit to Stand: Min assist;+2 physical assistance;+2 safety/equipment Stand pivot transfers: Min assist;+2 physical assistance;+2 safety/equipment       General transfer comment: Stood x3 times; minA +2 for stability and for power up; modA +1 for peri care in standing.    Balance Overall balance assessment: Needs assistance Sitting-balance support: Feet supported;Bilateral upper extremity supported;No upper extremity supported;Single extremity supported Sitting balance-Leahy Scale: Fair     Standing balance support: Bilateral upper extremity supported Standing balance-Leahy Scale: Poor Standing balance comment: RW for support                           ADL either performed or assessed with clinical judgement   ADL Overall ADL's : Needs assistance/impaired                         Toilet Transfer: Minimal assistance;+2 for physical assistance;+2 for safety/equipment;BSC;Ambulation Toilet Transfer Details (indicate cue type and reason): MinA+2/ModA +1 for sit to stand for toilet hygiene. Toileting- Clothing Manipulation and Hygiene: Maximal assistance;+2 for physical assistance;+2 for safety/equipment;Cueing for safety;Cueing for sequencing;Sitting/lateral lean;Sit to/from stand Toileting - Clothing Manipulation Details (indicate cue type and reason): Pt requiring assist for stability with RW and unable to reach for pericare.     Functional mobility during ADLs: Moderate assistance;Rolling walker;Cueing for safety General ADL Comments: Pt limited by decreased strength, decreased ability to  care for self and decreased alertness to situation. Pt requiring increased time for word retrieval and unsure if pt is [facility] or has inattentive tendencies.     Vision       Perception      Praxis      Cognition Arousal/Alertness: Awake/alert Behavior During Therapy: WFL for tasks assessed/performed Overall Cognitive Status: Impaired/Different from baseline Area of Impairment: Memory                     Memory: Decreased short-term memory;Decreased recall of precautions         General Comments: recalling 2/3 precautions requiring assist with "lifting" Pt with delayed response time and unsure if due to [facility] or inattention.        Exercises     Shoulder Instructions       General Comments >92% on RA    Pertinent Vitals/ Pain       Pain Assessment: Faces Faces Pain Scale: Hurts even more Pain Location: back Pain Descriptors / Indicators: Grimacing;Guarding Pain Intervention(s): Monitored during session;Premedicated before session;Repositioned  Home Living                                          Prior Functioning/Environment              Frequency  Min 2X/week        Progress Toward Goals  OT Goals(current goals can now be found in the care plan section)  Progress towards OT goals: Progressing toward goals  Acute Rehab OT Goals Patient Stated Goal: decrease pain OT Goal Formulation: With patient Time For Goal Achievement: 08/16/20 Potential to Achieve Goals: Fair ADL Goals Pt Will Perform Upper Body Dressing: with min assist;sitting Pt Will Perform Lower Body Dressing: with min guard assist;with adaptive equipment;sit to/from stand Pt Will Transfer to Toilet: with min guard assist;ambulating Additional ADL Goal #1: Pt will indep recall all back precautions to ensure safe transition into the next venue of care  Plan Discharge plan remains appropriate    Co-evaluation    PT/OT/SLP Co-Evaluation/Treatment: Yes Reason for Co-Treatment: Complexity of the patient's impairments (multi-system involvement);For patient/therapist safety;To address functional/ADL transfers   OT goals addressed during session: ADL's  and self-care      AM-PAC OT "6 Clicks" Daily Activity     Outcome Measure   Help from another person eating meals?: None Help from another person taking care of personal grooming?: A Little Help from another person toileting, which includes using toliet, bedpan, or urinal?: A Lot Help from another person bathing (including washing, rinsing, drying)?: A Lot Help from another person to put on and taking off regular upper body clothing?: A Lot Help from another person to put on and taking off regular lower body clothing?: A Lot 6 Click Score: 15    End of Session Equipment Utilized During Treatment: Gait belt;Back brace  OT Visit Diagnosis: Unsteadiness on feet (R26.81);Repeated falls (R29.6);Muscle weakness (generalized) (M62.81);History of falling (Z91.81);Pain Pain - part of body:  (back)   Activity Tolerance Patient tolerated treatment well   Patient Left in chair;with call bell/phone within reach;with chair alarm set   Nurse Communication Mobility status;Precautions        Time: 0254-2706 OT Time Calculation (min): 36 min  Charges: OT General Charges $OT Visit: 1 Visit OT Treatments $Self Care/Home Management : 8-22 mins  Ebony Hail C, OTR/L Acute  Rehabilitation Services Pager: [phone] Office: [phone]    Jacqueline Richmond C 08/06/2020, 1:09 PM

## 2020-08-06 NOTE — Progress Notes (Signed)
Inpatient Rehab Admissions Coordinator:   Note pt extubated and able to participating in therapy.  Will reconsult for CIR per our protocol.  Shann Medal, PT, DPT Admissions Coordinator [phone] 08/06/20  9:59 AM

## 2020-08-06 NOTE — Progress Notes (Signed)
PROGRESS NOTE    Jacqueline Richmond  FKC:[id number] DOB: Sep 22, 1945 DOA: 07/31/2020 PCP: Bernerd Limbo, MD   Brief Narrative:  Jacqueline Richmond is a 75 y.o. female with medical history significant for type 2 diabetes, essential hypertension, CKD 4, who presented to [facility] ED after a mechanical fall at home.  According to her, she was sitting in a rocking chair in her porch for 1 to 2 hours then she got up loss of balance and fell on the steps hitting her head.  No loss of consciousness.  CT head nonacute.  CT chest without contrast showed extension injury of the thoracic spine with displaced T10 vertebral body fracture extending into the T9-T10 disc space and anterior vertebral body.  Fracture extends through anterior osteophytes as well as the anterior superior aspect of T11 vertebral body.  There is adjacent paravertebral hemorrhage and small volume of gas likely related to escape vacuum phenomenon.  Neurosurgery was consulted due to these findings.  Also showed on CT chest without contrast moderate to large right pleural effusion, heterogeneous density, may represent hemorrhage, collapsed lung or less likely masses, trauma surgery Dr. Kae Heller was consulted due to these findings.Marland Kitchen  Heterogeneously enlarged right lobe of the thyroid gland with scattered curvilinear calcifications.  Patient was otherwise hemodynamically stable in the ED and required only 2 L of oxygen.  She was admitted under hospitalist service.  Subsequently underwent T10-11 stabilization for unstable T10 fracture on 08/01/2020.  Due to moderate to large right hemothorax, thoracentesis was ordered for her on 08/02/2020 however before having procedure was started in the IR, patient had acute change of mental status and acute hypoxic respiratory failure for which rapid response was called.  Patient was unable to protect her airway so had to be intubated.  Code stroke was called.  She was loaded with Keppra 2 g IV by neurology.  She was transferred  under PCCM.  She had multiple work-up for stroke and she was ruled out of stroke.  She also had EEG and continuous EEG and both of them were negative for any epileptiform activity.  She underwent right-sided thoracentesis with removal of 1800 cc of bloody fluid.  She was subsequently extubated on 08/03/2020 and transferred under [place] again on 08/04/2020.  Assessment & Plan:   Active Problems:   Acute renal failure superimposed on stage 3a chronic kidney disease (HCC)   Hyperkalemia   HTN (hypertension)   T10 vertebral fracture (HCC)   Hemothorax on right  Displaced T10 vertebral fracture post mechanical fall at home: Neurosurgery on board and now she is status post T10-11 stabilization for unstable T10 fracture on 08/01/2020.  Has TLSO brace.  Working with PT.  They recommend CIR.  Per neurosurgery note, will need staples removed on 08/15/2020.  appreciate neurosurgery help and defer management to them.  Large exudative right pleural effusion, unclear etiology CT chest without contrast moderate to large right pleural effusion, heterogeneous density, may represent hemorrhage, collapsed lung or less likely masses.  Trauma surgery was consulted initially but there was no comment regarding this on their note. thoracentesis was ordered for her on 08/02/2020 however before having procedure was started in the IR, patient had acute change of mental status and acute hypoxic respiratory failure for which rapid response was called.  Patient was unable to protect her airway so had to be intubated.  Status postthoracentesis with removal of 1800 cc of bloody fluid on 08/03/2020.  This is consistent with exudative fluid.  Pleural fluid is showing  abundant WBC, predominantly PMNs but no organisms.  Procalcitonin elevated over 16 but patient's leukocytosis resolved, she is afebrile, asymptomatic.  Doubt bacterial pneumonia.  Will watch off antibiotics.  Low threshold to initiate antibiotics if any symptom appear.  Acute  hypoxic respiratory failure: Needed intubation and then extubated on 08/03/2020.  Currently on room air.  sudden unresponsiveness with stiffening of BL upper extremities and starring off. CTH with no ICH or large stroke, CTA with no LVO. Loaded with Keppra 2G IV.  EEG is suggestive of moderate diffuse encephalopathy, nonspecific etiology but likely related to sedation.No seizures or epileptiform discharges were seen throughout the recording.  Neurology exam is normal.  She has been taken off of antiseizure medications.  AKI on CKD stage IIIa: Her baseline creatinine seems to be around 1.4.  Trend has been 2.68>2.42> 2.04> 1.59> 1.57> 1.68.  Very close to her baseline.  Continue to avoid nephrotoxic agent.  Acute on chronic diastolic congestive heart failure: Significant improvement, lungs clear to auscultation, she is off of oxygen and saturating 90% on room air.  She already received IV Lasix 40 mg this morning.  We will stop further doses and start on Lasix 40 mg p.o. daily starting tomorrow.  Hyperkalemia: Resolved  Leukocytosis, likely reactive in the setting of fracture, improving.  She is afebrile.  Doubt infection.  Improving.  Monitor.  Essential hypertension: Blood pressure stable.  Continue beta-blocker and clonidine.  Heterogeneously enlarged right lobe of the thyroid gland with scattered curvilinear calcifications.  TSH within normal range.  Ultrasound shows 1 nodule that meets criteria for FNA.  Not urgent at this point in time.  We will see if this can be done as inpatient once she is more stable.  Otherwise can be done as outpatient.  Type 2 diabetes mellitus: Blood sugar very well controlled.  Continue SSI.  Nausea and vomiting: Resolved.  DVT prophylaxis: SCDs Start: 08/02/20 0750   Code Status: Full Code  Family Communication: None present at bedside.  Plan of care discussed with patient in length and he verbalized understanding and agreed with it.  Status is:  Inpatient  Remains inpatient appropriate because:Inpatient level of care appropriate due to severity of illness   Dispo:  Patient From: Home  Planned Disposition: CIR  medically stable for discharge: yes          Estimated body mass index is 32.95 kg/m as calculated from the following:   Height as of this encounter: '5\' 3"'  (1.6 m).   Weight as of this encounter: 84.4 kg.      Nutritional status:               Consultants:   Trauma surgery  Neurosurgery  Neurology  Procedures:   As above  Antimicrobials:  Anti-infectives (From admission, onward)   None         Subjective: Seen and examined.  No complaints.  Alert and oriented.  Objective: Vitals:   08/05/20 2309 08/06/20 0303 08/06/20 0309 08/06/20 0835  BP: (!) 170/73 (!) 120/57 (!) 128/54 138/63  Pulse: 82 62 62   Resp: '17 16 17   ' Temp: 98.4 F (36.9 C) 98 F (36.7 C)  97.8 F (36.6 C)  TempSrc: Oral Oral  Oral  SpO2: 95% 96% 96% 95%  Weight:      Height:        Intake/Output Summary (Last 24 hours) at 08/06/2020 0938 Last data filed at 08/06/2020 0303 Gross per 24 hour  Intake 600 ml  Output  2550 ml  Net -1950 ml   Filed Weights   07/31/20 1708  Weight: 84.4 kg    Examination: General exam: Appears calm and comfortable, obese Respiratory system: Clear to auscultation. Respiratory effort normal. Cardiovascular system: S1 & S2 heard, RRR. No JVD, murmurs, rubs, gallops or clicks. No pedal edema. Gastrointestinal system: Abdomen is nondistended, soft and nontender. No organomegaly or masses felt. Normal bowel sounds heard. Central nervous system: Alert and oriented. No focal neurological deficits. Extremities: Symmetric 5 x 5 power. Skin: No rashes, lesions or ulcers.  Psychiatry: Judgement and insight appear normal. Mood & affect appropriate.    Data Reviewed: I have personally reviewed following labs and imaging studies  CBC: Recent Labs  Lab 08/02/20 0603  08/02/20 1702 08/02/20 1709 08/03/20 0804 08/05/20 0141 08/06/20 0652  WBC 14.9*  --  22.7* 15.9* 13.1* 9.8  NEUTROABS  --   --   --   --  9.6* 7.2  HGB 8.7* 8.5* 9.5* 8.8* 8.7* 8.8*  HCT 26.8* 25.0* 28.8* 27.9* 27.9* 27.3*  MCV 85.1  --  85.5 87.5 88.9 87.8  PLT 160  --  160 151 176 748   Basic Metabolic Panel: Recent Labs  Lab 08/01/20 0559 08/01/20 1226 08/02/20 0603 08/02/20 1702 08/03/20 0804 08/04/20 0425 08/05/20 0141 08/06/20 0652  NA 137   < > 138 141 139 141 141 141  K 5.6*   < > 4.8 4.4 4.2 4.2 4.4 3.8  CL 107   < > 110  --  111 115* 112* 109  CO2 22  --  20*  --  20* 21* 24 24  GLUCOSE 194*   < > 141*  --  76 101* 102* 100*  BUN 68*   < > 67*  --  72* 60* 58* 62*  CREATININE 2.68*   < > 2.42*  --  2.04* 1.59* 1.57* 1.68*  CALCIUM 8.7*  --  8.1*  --  7.7* 8.0* 8.1* 8.3*  MG 1.9  --   --   --  2.0  --   --   --   PHOS 6.4*  --   --   --   --   --   --   --    < > = values in this interval not displayed.   GFR: Estimated Creatinine Clearance: 30.7 mL/min (A) (by C-G formula based on SCr of 1.68 mg/dL (H)). Liver Function Tests: Recent Labs  Lab 08/01/20 0559 08/03/20 1159  AST 21  --   ALT 15  --   ALKPHOS 86  --   BILITOT 0.7  --   PROT 6.4*  --   ALBUMIN 2.7* 2.4*   No results for input(s): LIPASE, AMYLASE in the last 168 hours. No results for input(s): AMMONIA in the last 168 hours. Coagulation Profile: No results for input(s): INR, PROTIME in the last 168 hours. Cardiac Enzymes: No results for input(s): CKTOTAL, CKMB, CKMBINDEX, TROPONINI in the last 168 hours. BNP (last 3 results) No results for input(s): PROBNP in the last 8760 hours. HbA1C: No results for input(s): HGBA1C in the last 72 hours. CBG: Recent Labs  Lab 08/05/20 2058 08/05/20 2311 08/05/20 2351 08/06/20 0332 08/06/20 0739  GLUCAP 128* 122* 125* 96 100*   Lipid Profile: Recent Labs    08/06/20 0652  TRIG 221*   Thyroid Function Tests: No results for input(s): TSH,  T4TOTAL, FREET4, T3FREE, THYROIDAB in the last 72 hours. Anemia Panel: No results for input(s): VITAMINB12, FOLATE, FERRITIN,  TIBC, IRON, RETICCTPCT in the last 72 hours. Sepsis Labs: Recent Labs  Lab 08/01/20 0018 08/05/20 0141  PROCALCITON  --  16.21  LATICACIDVEN 1.7  --     Recent Results (from the past 240 hour(s))  Resp Panel by RT-PCR (Flu A&B, Covid) Nasopharyngeal Swab     Status: None   Collection Time: 07/31/20 11:08 PM   Specimen: Nasopharyngeal Swab; Nasopharyngeal(NP) swabs in vial transport medium  Result Value Ref Range Status   SARS Coronavirus 2 by RT PCR NEGATIVE NEGATIVE Final    Comment: (NOTE) SARS-CoV-2 target nucleic acids are NOT DETECTED.  The SARS-CoV-2 RNA is generally detectable in upper respiratory specimens during the acute phase of infection. The lowest concentration of SARS-CoV-2 viral copies this assay can detect is 138 copies/mL. A negative result does not preclude SARS-Cov-2 infection and should not be used as the sole basis for treatment or other patient management decisions. A negative result may occur with  improper specimen collection/handling, submission of specimen other than nasopharyngeal swab, presence of viral mutation(s) within the areas targeted by this assay, and inadequate number of viral copies(<138 copies/mL). A negative result must be combined with clinical observations, patient history, and epidemiological information. The expected result is Negative.  Fact Sheet for Patients:  EntrepreneurPulse.com.au  Fact Sheet for Healthcare Providers:  IncredibleEmployment.be  This test is no t yet approved or cleared by the [place] FDA and  has been authorized for detection and/or diagnosis of SARS-CoV-2 by FDA under an Emergency Use Authorization (EUA). This EUA will remain  in effect (meaning this test can be used) for the duration of the COVID-19 declaration under Section 564(b)(1) of  the Act, 21 U.S.C.section 360bbb-3(b)(1), unless the authorization is terminated  or revoked sooner.       Influenza A by PCR NEGATIVE NEGATIVE Final   Influenza B by PCR NEGATIVE NEGATIVE Final    Comment: (NOTE) The Xpert Xpress SARS-CoV-2/FLU/RSV plus assay is intended as an aid in the diagnosis of influenza from Nasopharyngeal swab specimens and should not be used as a sole basis for treatment. Nasal washings and aspirates are unacceptable for Xpert Xpress SARS-CoV-2/FLU/RSV testing.  Fact Sheet for Patients: EntrepreneurPulse.com.au  Fact Sheet for Healthcare Providers: IncredibleEmployment.be  This test is not yet approved or cleared by the [place] FDA and has been authorized for detection and/or diagnosis of SARS-CoV-2 by FDA under an Emergency Use Authorization (EUA). This EUA will remain in effect (meaning this test can be used) for the duration of the COVID-19 declaration under Section 564(b)(1) of the Act, 21 U.S.C. section 360bbb-3(b)(1), unless the authorization is terminated or revoked.  Performed at [facility], [address] [place], [place] [address]   Culture, blood (Routine X 2) w Reflex to ID Panel     Status: None (Preliminary result)   Collection Time: 08/01/20 12:05 AM   Specimen: BLOOD  Result Value Ref Range Status   Specimen Description   Final    BLOOD RIGHT ANTECUBITAL Performed at [facility], [address] [place], [place] [address]    Special Requests   Final    NONE Performed at [facility], [address] [place], [place] [address]    Culture   Final    NO GROWTH 4 DAYS Performed at [facility], [place] [address], [place],  [address]    Report Status PENDING  Incomplete  Culture, blood (Routine X 2) w Reflex to ID Panel     Status:  None (Preliminary result)   Collection Time: 08/01/20 12:10 AM   Specimen: BLOOD   Result Value Ref Range Status   Specimen Description   Final    BLOOD LEFT ANTECUBITAL Performed at [facility], [address] [place], [place] [address]    Special Requests   Final    NONE Performed at [facility], [address] [place], [place] [address]    Culture   Final    NO GROWTH 4 DAYS Performed at [facility], [place] [address] [place], [place] [address]    Report Status PENDING  Incomplete  MRSA PCR Screening     Status: None   Collection Time: 08/01/20  2:59 AM   Specimen: Nasal Mucosa; Nasopharyngeal  Result Value Ref Range Status   MRSA by PCR NEGATIVE NEGATIVE Final    Comment:        The GeneXpert MRSA Assay (FDA approved for NASAL specimens only), is one component of a comprehensive MRSA colonization surveillance program. It is not intended to diagnose MRSA infection nor to guide or monitor treatment for MRSA infections. Performed at [facility], [place] [address] [place], [place] [address]   Body fluid culture w Gram Stain     Status: None (Preliminary result)   Collection Time: 08/03/20 11:30 AM   Specimen: Fluid  Result Value Ref Range Status   Specimen Description FLUID RIGHT PLEURAL  Final   Special Requests NONE  Final   Gram Stain   Final    ABUNDANT WBC PRESENT, PREDOMINANTLY PMN NO ORGANISMS SEEN    Culture   Final    NO GROWTH 2 DAYS Performed at [facility], [place] [address] [place], [place] [address]    Report Status PENDING  Incomplete      Radiology Studies: MR BRAIN WO CONTRAST  Result Date: 08/04/2020 CLINICAL DATA:  Seizure with abnormal mental status EXAM: MRI HEAD WITHOUT CONTRAST TECHNIQUE: Multiplanar, multiecho pulse sequences of the brain and surrounding structures were obtained without intravenous contrast. COMPARISON:  CT and CTA from 2 days ago FINDINGS: Brain: No acute infarction, hemorrhage, hydrocephalus, extra-axial collection or mass lesion. Extensive  low-density in the cerebral white matter attributed to chronic small vessel ischemia. There is both gliosis and scattered white matter lacunes. Cystic changes at the bilateral globus pallidus which may reflect remote insult. Preserved brain volume for age. Vascular: Preserved flow voids Skull and upper cervical spine: Normal marrow signal. 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 Artifact in the scalp where there are skin staples by prior CT. Sinuses/Orbits: No acute finding Other: Truncated and motion degraded study. The patient was stopped early due to hypoxia. IMPRESSION: 1. No acute finding. 2. Extensive chronic small vessel disease. 3. Motion degraded and truncated study due to patient condition. Electronically Signed   By: Monte Fantasia M.D.   On: 08/04/2020 09:58   DG Chest Port 1 View  Result Date: 08/04/2020 CLINICAL DATA:  Evaluate respiratory distress EXAM: PORTABLE CHEST 1 VIEW COMPARISON:  Yesterday FINDINGS: Cardiomegaly, indistinct hila, and diffuse interstitial and hazy airspace opacity. There are likely small pleural effusions. No pneumothorax. Postoperative thoracic spine. IMPRESSION: CHF. Electronically Signed   By: Monte Fantasia M.D.   On: 08/04/2020 10:42    Scheduled Meds: . atenolol  100 mg Oral Daily  . chlorhexidine gluconate (MEDLINE KIT)  15 mL Mouth Rinse BID  . Chlorhexidine Gluconate Cloth  6 each Topical Daily  . cloNIDine  0.2 mg Oral BID  .  dextromethorphan-guaiFENesin  1 tablet Oral BID  . [START ON 08/07/2020] furosemide  40 mg Oral Daily  . insulin aspart  0-9 Units Subcutaneous Q4H  . polyethylene glycol  17 g Oral Daily  . senna-docusate  2 tablet Oral QHS   Continuous Infusions:    LOS: 5 days   Time spent: 29 minutes   Darliss Cheney, MD [facility]  08/06/2020, 9:38 AM   How to contact the [facility] Attending or Consulting provider [place] or covering provider during after hours [place], for this patient?  1. Check the care team in [facility]  and look for a) attending/consulting [facility] provider listed and b) the [facility] team listed. Page or secure chat [phone]. 2. Log into [email or web address] and use 's universal password to access. If you do not have the password, please contact the hospital operator. 3. Locate the [facility] provider you are looking for under [facility] and page to a number that you can be directly reached. 4. If you still have difficulty reaching the provider, please page the [facility] (Director on Call) for the Hospitalists listed on amion for assistance.

## 2020-08-06 NOTE — Progress Notes (Signed)
Physical Therapy Treatment Patient Details Name: Jacqueline Richmond MRN: [id number] DOB: 1945/09/27 Today's Date: 08/06/2020    History of Present Illness 75 yo female presents to [facility] on 5/10 s/p fall down front brink steps with + head trauma. Pt sustained T10 fx with extension into T9-10 disc space, R pleural effusion. CTH shows L parietal scalp hematoma with no acute intracranial abnormality noted. s/p T8-11 stabilization for unstable T10 fracture on 5/11. Rapid response 5/12 requiring intubation (extubated 5/13) due to unresponsiveness and hypoxia, concern for seizures.  Pt with another rapid response event on 5/14 in MRI where she had to be placed on NRB due to low sats and difficulty breathing.  PMH includes type 2 diabetes, essential hypertension, CKD 4,  HLD, cataracts, R THA 2004.    PT Comments    The pt was agreeable to session this morning and demos good progress with OOB mobility at this time. The pt requires minA of 2 to power up from low surfaces, as well as repeated verbal cues for hand position and encouragement to complete mobility. The pt was then able to complete x2 short bouts of ambulation in the room with RW and minA of 2 with chair follow, but requires repeated cues for safety, positioning in RW, and posture which the pt is currently unable to maintain. The pt was able to recall 2/3 spinal precautions without cues, and needs assist/cueing to apply to mobility. The pt will continue to benefit from skilled PT acutely and following d/c to maximize return to functional independence she had prior to admission.    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 PT);Wheelchair cushion (measurements PT)    Recommendations for Other Services       Precautions / Restrictions Precautions Precautions: Fall;Back Precaution Booklet Issued: Yes (comment) Precaution  Comments: reviewed BLT; pt stating 2/3 Required Braces or Orthoses: Spinal Brace Spinal Brace: Thoracolumbosacral orthotic;Applied in sitting position;Other (comment) Spinal Brace Comments: repositioned in standing to recduce interference with telemetry Restrictions Weight Bearing Restrictions: No    Mobility  Bed Mobility Overal bed mobility: Needs Assistance             General bed mobility comments: in recliner pre and post session    Transfers Overall transfer level: Needs assistance Equipment used: Rolling walker (2 wheeled) Transfers: Sit to/from [employer] Transfers Sit to Stand: Min assist;+2 physical assistance;+2 safety/equipment Stand pivot transfers: Min assist;+2 physical assistance;+2 safety/equipment       General transfer comment: Stood x3 times; minA +2 for stability and for power up; modA +1 for peri care in standing. repeated cues (verbal and tactile) for posture and head positioning. increased time to fix trunk posture, limited ability to maintain  Ambulation/Gait Ambulation/Gait assistance: Min assist;+2 physical assistance;+2 safety/equipment (chair follow) Gait Distance (Feet): 4 Feet (+ 10 ft) Assistive device: Rolling walker (2 wheeled) Gait Pattern/deviations: Step-through pattern;Decreased stride length;Trunk flexed;Narrow base of support Gait velocity: decreased   General Gait Details: significant trunk flexion, heavy reliance on UE support. no overt LOB, minA for RW management and for stability       Balance Overall balance assessment: Needs assistance Sitting-balance support: Feet supported;Bilateral upper extremity supported;No upper extremity supported;Single extremity supported Sitting balance-Leahy Scale: Fair     Standing balance support: Bilateral upper extremity supported Standing balance-Leahy Scale: Poor Standing balance comment: RW for support  Cognition Arousal/Alertness:  Awake/alert Behavior During Therapy: WFL for tasks assessed/performed Overall Cognitive Status: Impaired/Different from baseline Area of Impairment: Memory;Problem solving;Safety/judgement                     Memory: Decreased short-term memory;Decreased recall of precautions   Safety/Judgement: Decreased awareness of safety;Decreased awareness of deficits   Problem Solving: Decreased initiation;Requires verbal cues;Requires tactile cues General Comments: recalling 2/3 precautions requiring assist with "lifting" Pt with delayed response time and unsure if due to [facility] or inattention.      Exercises      General Comments General comments (skin integrity, edema, etc.): SpO2 >92% on RA. pt found with [place] on but wall O2 turned off. [place] removed due to SpO2 stable on RA      Pertinent Vitals/Pain Pain Assessment: Faces Faces Pain Scale: Hurts even more Pain Location: back Pain Descriptors / Indicators: Grimacing;Guarding Pain Intervention(s): Limited activity within patient's tolerance;Monitored during session;Premedicated before session;Repositioned           PT Goals (current goals can now be found in the care plan section) Acute Rehab PT Goals Patient Stated Goal: decrease pain PT Goal Formulation: With patient Time For Goal Achievement: 08/16/20 Potential to Achieve Goals: Good Progress towards PT goals: Progressing toward goals    Frequency    Min 5X/week      PT Plan Current plan remains appropriate    Co-evaluation PT/OT/SLP Co-Evaluation/Treatment: Yes Reason for Co-Treatment: Complexity of the patient's impairments (multi-system involvement);For patient/therapist safety;To address functional/ADL transfers PT goals addressed during session: Mobility/safety with mobility;Balance;Proper use of DME;Strengthening/ROM OT goals addressed during session: ADL's and self-care      AM-PAC PT "6 Clicks" Mobility   Outcome Measure  Help needed turning from your  back to your side while in a flat bed without using bedrails?: A Lot Help needed moving from lying on your back to sitting on the side of a flat bed without using bedrails?: A Lot Help needed moving to and from a bed to a chair (including a wheelchair)?: A Lot Help needed standing up from a chair using your arms (e.g., wheelchair or bedside chair)?: A Lot Help needed to walk in hospital room?: A Lot Help needed climbing 3-5 steps with a railing? : A Lot 6 Click Score: 12    End of Session Equipment Utilized During Treatment: Back brace;Gait belt Activity Tolerance: Patient tolerated treatment well Patient left: in chair;with call bell/phone within reach;with chair alarm set Nurse Communication: Mobility status PT Visit Diagnosis: History of falling (Z91.81);Difficulty in walking, not elsewhere classified (R26.2);Pain     Time: 1039-1110 PT Time Calculation (min) (ACUTE ONLY): 31 min  Charges:  $Gait Training: 8-22 mins                     Karma Ganja, PT, DPT   Acute Rehabilitation Department Pager #: [phone]   Otho Bellows 08/06/2020, 2:31 PM

## 2020-08-06 NOTE — Care Management Important Message (Signed)
Important Message  Patient Details  Name: Jacqueline Richmond MRN: [id number] Date of Birth: 03-25-1945   Medicare Important Message Given:  Yes     Orbie Pyo 08/06/2020, 3:23 PM

## 2020-08-06 NOTE — Progress Notes (Signed)
  NEUROSURGERY PROGRESS NOTE   No issues overnight.  Complains of appropriate back soreness No new N/T/W  EXAM:  BP 138/63 (BP Location: Left Arm)   Pulse 62   Temp 97.8 F (36.6 C) (Oral)   Resp 17   Ht 5\' 3"  (1.6 m)   Wt 84.4 kg   SpO2 95%   BMI 32.95 kg/m   Awake, alert, oriented  Speech fluent, appropriate  CN grossly intact  MAEW, nonfocal Incision: dried blood  IMPRESSION/PLAN 75 y.o. female POD5 T8-11 stabilization for unstable T10 fracture. Motor grossly intact. No new NS recs. Continue TLSO with upright and OOB. Continue PT/OT. ?CIR vs. SNF. Ready for discharge from NS perspective when deemed appropriate by primary team. Will need staples removed 5/25. If patient remains in house, please call if you would like NS to remove the staples. We would like to f/u 2 weeks after discharge for monitoring. Please call for any concerns.

## 2020-08-06 NOTE — Progress Notes (Signed)
Inpatient Rehabilitation Admissions Coordinator  I met with patient at bedside and then contacted her spouse by phone. They prefer [facility] SNF for rehab venue for their daughter, Jacqueline Richmond works there. I have notified acute team and TOC. I will not pursue CIR admit.  Danne Baxter, RN, MSN Rehab Admissions Coordinator [phone] 08/06/2020 3:30 PM

## 2020-08-07 ENCOUNTER — Inpatient Hospital Stay: Payer: Self-pay

## 2020-08-07 LAB — GLUCOSE, CAPILLARY
Glucose-Capillary: 107 mg/dL — ABNORMAL HIGH (ref 70–99)
Glucose-Capillary: 114 mg/dL — ABNORMAL HIGH (ref 70–99)
Glucose-Capillary: 127 mg/dL — ABNORMAL HIGH (ref 70–99)
Glucose-Capillary: 156 mg/dL — ABNORMAL HIGH (ref 70–99)
Glucose-Capillary: 90 mg/dL (ref 70–99)
Glucose-Capillary: 96 mg/dL (ref 70–99)

## 2020-08-07 LAB — CBC WITH DIFFERENTIAL/PLATELET
Abs Immature Granulocytes: 0.09 10*3/uL — ABNORMAL HIGH (ref 0.00–0.07)
Basophils Absolute: 0.1 10*3/uL (ref 0.0–0.1)
Basophils Relative: 1 %
Eosinophils Absolute: 0.4 10*3/uL (ref 0.0–0.5)
Eosinophils Relative: 4 %
HCT: 26.6 % — ABNORMAL LOW (ref 36.0–46.0)
Hemoglobin: 8.5 g/dL — ABNORMAL LOW (ref 12.0–15.0)
Immature Granulocytes: 1 %
Lymphocytes Relative: 19 %
Lymphs Abs: 2 10*3/uL (ref 0.7–4.0)
MCH: 27.9 pg (ref 26.0–34.0)
MCHC: 32 g/dL (ref 30.0–36.0)
MCV: 87.2 fL (ref 80.0–100.0)
Monocytes Absolute: 0.8 10*3/uL (ref 0.1–1.0)
Monocytes Relative: 8 %
Neutro Abs: 7.2 10*3/uL (ref 1.7–7.7)
Neutrophils Relative %: 67 %
Platelets: 204 10*3/uL (ref 150–400)
RBC: 3.05 MIL/uL — ABNORMAL LOW (ref 3.87–5.11)
RDW: 15.9 % — ABNORMAL HIGH (ref 11.5–15.5)
WBC: 10.5 10*3/uL (ref 4.0–10.5)
nRBC: 0 % (ref 0.0–0.2)

## 2020-08-07 LAB — BASIC METABOLIC PANEL
Anion gap: 7 (ref 5–15)
BUN: 68 mg/dL — ABNORMAL HIGH (ref 8–23)
CO2: 25 mmol/L (ref 22–32)
Calcium: 8.3 mg/dL — ABNORMAL LOW (ref 8.9–10.3)
Chloride: 107 mmol/L (ref 98–111)
Creatinine, Ser: 1.76 mg/dL — ABNORMAL HIGH (ref 0.44–1.00)
GFR, Estimated: 30 mL/min — ABNORMAL LOW (ref >60)
Glucose, Bld: 96 mg/dL (ref 70–99)
Potassium: 3.7 mmol/L (ref 3.5–5.1)
Sodium: 139 mmol/L (ref 135–145)

## 2020-08-07 NOTE — Progress Notes (Signed)
Physical Therapy Treatment Patient Details Name: Jacqueline Richmond MRN: [id number] DOB: 24-Feb-1946 Today's Date: 08/07/2020    History of Present Illness 75 yo female presents to [facility] on 5/10 s/p fall down front brink steps with + head trauma. Pt sustained T10 fx with extension into T9-10 disc space, R pleural effusion. CTH shows L parietal scalp hematoma with no acute intracranial abnormality noted. s/p T8-11 stabilization for unstable T10 fracture on 5/11. Rapid response 5/12 requiring intubation (extubated 5/13) due to unresponsiveness and hypoxia, concern for seizures.  Pt with another rapid response event on 5/14 in MRI where she had to be placed on NRB due to low sats and difficulty breathing.  PMH includes type 2 diabetes, essential hypertension, CKD 4,  HLD, cataracts, R THA 2004.    PT Comments    Pt progressing towards her physical therapy goals. Able to participate in seated warm up exercises for BLE's and ambulate x 30 feet with a walker and chair follow. Reports dizziness, however, BP stable upon assessment. Pt displays decreased gait speed, balance deficits, decreased endurance, weakness. Recommend post acute rehab to address deficits and maximize functional mobility.     Follow Up Recommendations  SNF     Equipment Recommendations  Wheelchair (measurements PT);Wheelchair cushion (measurements PT)    Recommendations for Other Services       Precautions / Restrictions Precautions Precautions: Fall;Back Precaution Booklet Issued: Yes (comment) Required Braces or Orthoses: Spinal Brace Spinal Brace: Thoracolumbosacral orthotic;Applied in sitting position Restrictions Weight Bearing Restrictions: No    Mobility  Bed Mobility Overal bed mobility: Needs Assistance             General bed mobility comments: in recliner pre and post session    Transfers Overall transfer level: Needs assistance Equipment used: Rolling walker (2 wheeled) Transfers: Sit to/from Stand Sit  to Stand: Mod assist         General transfer comment: Cues for scooting forward to edge of chair, hand placement, modA to boost up to standing position from recliner  Ambulation/Gait Ambulation/Gait assistance: Min assist Gait Distance (Feet): 30 Feet Assistive device: Rolling walker (2 wheeled) Gait Pattern/deviations: Step-through pattern;Decreased stride length;Trunk flexed;Narrow base of support Gait velocity: decreased   General Gait Details: significant trunk flexion, heavy reliance on UE support, minA for balance. Chair follow utilized   Marine scientist [place] (Stroke Patients Only)       Balance Overall balance assessment: Needs assistance Sitting-balance support: Feet supported;Bilateral upper extremity supported;No upper extremity supported;Single extremity supported Sitting balance-Leahy Scale: Fair     Standing balance support: Bilateral upper extremity supported Standing balance-Leahy Scale: Poor Standing balance comment: RW for support                            Cognition Arousal/Alertness: Awake/alert Behavior During Therapy: WFL for tasks assessed/performed Overall Cognitive Status: Impaired/Different from baseline Area of Impairment: Memory;Problem solving;Safety/judgement                     Memory: Decreased short-term memory;Decreased recall of precautions   Safety/Judgement: Decreased awareness of safety;Decreased awareness of deficits   Problem Solving: Decreased initiation;Requires verbal cues;Requires tactile cues        Exercises General Exercises - Lower Extremity Ankle Circles/Pumps: Both;20 reps;Seated Quad Sets: Both;15 reps;Seated Long Arc Quad: Both;15 reps;Seated Hip Flexion/Marching: Both;10 reps;Seated  General Comments        Pertinent Vitals/Pain Pain Assessment: Faces Faces Pain Scale: Hurts little more Pain Location: back Pain Descriptors /  Indicators: Grimacing;Guarding Pain Intervention(s): Monitored during session    Home Living                      Prior Function            PT Goals (current goals can now be found in the care plan section) Acute Rehab PT Goals Patient Stated Goal: decrease pain PT Goal Formulation: With patient Time For Goal Achievement: 08/16/20 Potential to Achieve Goals: Good Progress towards PT goals: Progressing toward goals    Frequency    Min 3X/week      PT Plan Discharge plan needs to be updated;Frequency needs to be updated    Co-evaluation              AM-PAC PT "6 Clicks" Mobility   Outcome Measure  Help needed turning from your back to your side while in a flat bed without using bedrails?: A Lot Help needed moving from lying on your back to sitting on the side of a flat bed without using bedrails?: A Lot Help needed moving to and from a bed to a chair (including a wheelchair)?: A Lot Help needed standing up from a chair using your arms (e.g., wheelchair or bedside chair)?: A Lot Help needed to walk in hospital room?: A Little Help needed climbing 3-5 steps with a railing? : A Lot 6 Click Score: 13    End of Session Equipment Utilized During Treatment: Back brace;Gait belt Activity Tolerance: Patient tolerated treatment well Patient left: in chair;with call bell/phone within reach;with chair alarm set Nurse Communication: Mobility status PT Visit Diagnosis: History of falling (Z91.81);Difficulty in walking, not elsewhere classified (R26.2);Pain     Time: 5208-0223 PT Time Calculation (min) (ACUTE ONLY): 19 min  Charges:  $Therapeutic Activity: 8-22 mins                     Wyona Almas, PT, DPT Acute Rehabilitation Services Pager [phone] Office [phone]    Deno Etienne 08/07/2020, 5:00 PM

## 2020-08-07 NOTE — TOC Progression Note (Signed)
Transition of Care [facility]) - Progression Note    Patient Details  Name: Jacqueline Richmond MRN: [id number] Date of Birth: 01/17/1946  Transition of Care [facility]) CM/SW [place], [place] Phone Number: 08/07/2020, 3:50 PM  Clinical Narrative:     Received SNF authorization for 3 days 5/18-5/20 Reference # [id number]  Thurmond Butts, MSW, LCSW Clinical Social Worker   Expected Discharge Plan: [place] Barriers to Discharge: Insurance Authorization,Continued Medical Work up,SNF Pending bed offer  Expected Discharge Plan and Services Expected Discharge Plan: [place] In-house Referral: Clinical Social Work     Living arrangements for the past 2 months: Single Family Home                                       Social Determinants of Health (SDOH) Interventions    Readmission Risk Interventions No flowsheet data found.

## 2020-08-07 NOTE — TOC Progression Note (Signed)
Transition of Care [facility]) - Progression Note    Patient Details  Name: Jacqueline Richmond MRN: [id number] Date of Birth: Feb 20, 1946  Transition of Care [facility]) CM/SW [place], [place] Phone Number: 08/07/2020, 12:55 PM  Clinical Narrative:     Helene Kelp confirmed bed offer- Insurance Auth started reference # [id number] RN updated- covid test requested   Thurmond Butts, MSW, LCSW Clinical Social Worker    Expected Discharge Plan: [place] Barriers to Discharge: Insurance Authorization,Continued Medical Work up,SNF Pending bed offer  Expected Discharge Plan and Services Expected Discharge Plan: [place] In-house Referral: Clinical Social Work     Living arrangements for the past 2 months: Single Family Home                                       Social Determinants of Health (SDOH) Interventions    Readmission Risk Interventions No flowsheet data found.

## 2020-08-07 NOTE — TOC Initial Note (Signed)
Transition of Care [facility]) - Initial/Assessment Note    Patient Details  Name: Jacqueline Richmond MRN: [id number] Date of Birth: May 10, 1945  Transition of Care [facility]) CM/SW Contact:    Vinie Sill, LCSW Phone Number: 08/07/2020, 12:26 PM  Clinical Narrative:                  CSW met with patient at bedside. CSW introduced self and explained role. CSW confirmed with patient was agreeable to short term rehab as SNF.preferred SNF choice is only [place]. CSW explained the SNF process. Patient states no questions or concerns at this time.   CSW sent referral to [facility] and message requesting to review clinicals. CSW will provide bed offer once available.   Thurmond Butts, MSW, LCSW Clinical Social Worker    Expected Discharge Plan: Skilled Nursing Facility Barriers to Discharge: [place] Work up,SNF Pending bed offer   Patient Goals and CMS Choice        Expected Discharge Plan and Services Expected Discharge Plan: [place] In-house Referral: Clinical Social Work     Living arrangements for the past 2 months: Single Family Home                                      Prior Living Arrangements/Services Living arrangements for the past 2 months: Single Family Home Lives with:: Self,Spouse Patient language and need for interpreter reviewed:: No        Need for Family Participation in Patient Care: Yes (Comment) Care giver support system in place?: Yes (comment)   Criminal Activity/Legal Involvement Pertinent to Current Situation/Hospitalization: No - Comment as needed  Activities of Daily Living Home Assistive Devices/Equipment: None ADL Screening (condition at time of admission) Patient's cognitive ability adequate to safely complete daily activities?: Yes Is the patient deaf or have difficulty hearing?: No Does the patient have difficulty seeing, even when wearing glasses/contacts?: No Does the patient have  difficulty concentrating, remembering, or making decisions?: No Patient able to express need for assistance with ADLs?: Yes Does the patient have difficulty dressing or bathing?: No Independently performs ADLs?: Yes (appropriate for developmental age) Does the patient have difficulty walking or climbing stairs?: Yes Weakness of Legs: Both Weakness of Arms/Hands: None  Permission Sought/Granted Permission sought to share information with : Family Supports Permission granted to share information with : Yes, Verbal Permission Granted  Share Information with NAME: Hudson,Ronald  Permission granted to share info w AGENCY: SNFs  Permission granted to share info w Relationship: spouse  Permission granted to share info w Contact Information: [phone]  Emotional Assessment   Attitude/Demeanor/Rapport: Engaged Affect (typically observed): Accepting,Appropriate Orientation: : Oriented to Self,Oriented to Place,Oriented to  Time,Oriented to Situation Alcohol / Substance Use: Not Applicable Psych Involvement: No (comment)  Admission diagnosis:  Thyroid nodule [E04.1] Pleural effusion [J90] Fall [W19.XXXA] T10 vertebral fracture ([place]) [S22.079A] Laceration of scalp, initial encounter [S01.01XA] Closed fracture of tenth thoracic vertebra, unspecified fracture morphology, initial encounter [facility]) [R67.893Y] Patient Active Problem List   Diagnosis Date Noted  . Hemothorax on right 08/02/2020  . T10 vertebral fracture ([place]) 07/31/2020  . Rotator cuff arthropathy of both shoulders 04/15/2019  . History of colonic polyps   . Polyp of transverse colon   . Diverticulosis of colon without hemorrhage   . Difficult intubation   . 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  . Type II or unspecified type diabetes mellitus without  mention of complication, not stated as uncontrolled 08/29/2011  . CKD (chronic kidney disease) stage 3, GFR 30-59 ml/min (HCC) 08/29/2011  . Acute renal failure superimposed on stage 3a  chronic kidney disease ([place]) 08/29/2011  . Hyperkalemia 08/29/2011  . HTN (hypertension) 08/29/2011  . Gout 08/29/2011   PCP:  Bernerd Limbo, MD Pharmacy:   [id number] [name]: [phone]: [phone]    Social Determinants of Health (SDOH) Interventions    Readmission Risk Interventions No flowsheet data found.

## 2020-08-07 NOTE — NC FL2 (Signed)
[place] LEVEL OF CARE SCREENING TOOL     IDENTIFICATION  Patient Name: Jacqueline Richmond Birthdate: [birth year] Sex: female Admission Date (Current Location): 07/31/2020  [facility] and [place] Number:  Herbalist and Address:  [facility], [place] [address], [place], [place] [address]      Provider Number: [id number]  Attending Physician Name and Address:  Darliss Cheney, MD  Relative Name and Phone Number:       Current Level of Care: Hospital Recommended Level of Care: [place] Prior Approval Number:    Date Approved/Denied:   PASRR Number: [id number]  Discharge Plan:      Current Diagnoses: Patient Active Problem List   Diagnosis Date Noted  . Hemothorax on right 08/02/2020  . T10 vertebral fracture ([place]) 07/31/2020  . Rotator cuff arthropathy of both shoulders 04/15/2019  . History of colonic polyps   . Polyp of transverse colon   . Diverticulosis of colon without hemorrhage   . Difficult intubation   . 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  . Type II or unspecified type diabetes mellitus without mention of complication, not stated as uncontrolled 08/29/2011  . CKD (chronic kidney disease) stage 3, GFR 30-59 ml/min (HCC) 08/29/2011  . Acute renal failure superimposed on stage 3a chronic kidney disease ([place]) 08/29/2011  . Hyperkalemia 08/29/2011  . HTN (hypertension) 08/29/2011  . Gout 08/29/2011    Orientation RESPIRATION BLADDER Height & Weight     Self,Time,Situation,Place  Normal External catheter,Incontinent Weight: 186 lb (84.4 kg) Height:  $Remove'5\' 3"'uzAacSj$  (160 cm)  BEHAVIORAL SYMPTOMS/MOOD NEUROLOGICAL BOWEL NUTRITION STATUS      Continent Diet (please see discharge summary)  AMBULATORY STATUS COMMUNICATION OF NEEDS Skin   Supervision Verbally Surgical wounds (closed incison, Back)                       Personal Care Assistance Level of Assistance  Bathing,Feeding,Dressing Bathing Assistance: Limited  assistance Feeding assistance: Independent Dressing Assistance: Limited assistance     Functional Limitations Info  Sight,Hearing,Speech Sight Info: Adequate Hearing Info: Adequate Speech Info: Adequate    SPECIAL CARE FACTORS FREQUENCY  PT (By licensed PT),OT (By licensed OT)     PT Frequency: 5x per week OT Frequency: 5x per wek            Contractures Contractures Info: Not present    Additional Factors Info  Code Status,Allergies Code Status Info: FULL Allergies Info: codeine,Iodinated Diagnostic Agents,Pentazocine,Statins           Current Medications (08/07/2020):  This is the current hospital active medication list Current Facility-Administered Medications  Medication Dose Route Frequency Provider Last Rate Last Admin  . atenolol (TENORMIN) tablet 100 mg  100 mg Oral Daily Jacky Kindle, MD   100 mg at 08/07/20 0823  . chlorhexidine gluconate (MEDLINE KIT) (PERIDEX) 0.12 % solution 15 mL  15 mL Mouth Rinse BID Jacky Kindle, MD   15 mL at 08/07/20 0824  . Chlorhexidine Gluconate Cloth 2 % PADS 6 each  6 each Topical Daily Corrie Mckusick, DO   6 each at 08/07/20 [phone]  . cloNIDine (CATAPRES) tablet 0.2 mg  0.2 mg Oral BID Jacky Kindle, MD   0.2 mg at 08/07/20 0824  . dextromethorphan-guaiFENesin (MUCINEX DM) 30-600 MG per 12 hr tablet 1 tablet  1 tablet Oral BID Darliss Cheney, MD   1 tablet at 08/07/20 0823  . furosemide (LASIX) tablet 40 mg  40 mg Oral Daily  Darliss Cheney, MD   40 mg at 08/07/20 6659  . insulin aspart (novoLOG) injection 0-9 Units  0-9 Units Subcutaneous Q4H Merlene Laughter F, NP   1 Units at 08/06/20 2045  . mometasone (ELOCON) 0.1 % cream 1 application  1 application Topical Daily PRN Irene Pap N, DO      . ondansetron (ZOFRAN) injection 4 mg  4 mg Intravenous Q6H PRN Opyd, Ilene Qua, MD   4 mg at 08/06/20 1207  . oxyCODONE (Oxy IR/ROXICODONE) immediate release tablet 5 mg  5 mg Oral Q4H PRN Jacky Kindle, MD   5 mg at 08/06/20 2046  . phenol  (CHLORASEPTIC) mouth spray 1 spray  1 spray Mouth/Throat PRN Darliss Cheney, MD   1 spray at 08/01/20 1806  . polyethylene glycol (MIRALAX / GLYCOLAX) packet 17 g  17 g Oral Daily Jacky Kindle, MD   17 g at 08/07/20 0826  . senna-docusate (Senokot-S) tablet 2 tablet  2 tablet Oral QHS Jacky Kindle, MD   2 tablet at 08/06/20 2046     Discharge Medications: Please see discharge summary for a list of discharge medications.  Relevant Imaging Results:  Relevant Lab Results:   Additional Information SSN [id number]  [place] COVID-19 Vaccine 07/17/2020 , 11/22/2019 , 05/26/2019 , 05/01/2019  Vinie Sill, LCSW

## 2020-08-07 NOTE — Progress Notes (Signed)
PROGRESS NOTE    Jacqueline Richmond  BJS:[id number] DOB: 1945/11/27 DOA: 07/31/2020 PCP: Bernerd Limbo, MD   Brief Narrative:  Jacqueline Richmond is a 75 y.o. female with medical history significant for type 2 diabetes, essential hypertension, CKD 4, who presented to [facility] ED after a mechanical fall at home.  According to her, she was sitting in a rocking chair in her porch for 1 to 2 hours then she got up loss of balance and fell on the steps hitting her head.  No loss of consciousness.  CT head nonacute.  CT chest without contrast showed extension injury of the thoracic spine with displaced T10 vertebral body fracture extending into the T9-T10 disc space and anterior vertebral body.  Fracture extends through anterior osteophytes as well as the anterior superior aspect of T11 vertebral body.  There is adjacent paravertebral hemorrhage and small volume of gas likely related to escape vacuum phenomenon.  Neurosurgery was consulted due to these findings.  Also showed on CT chest without contrast moderate to large right pleural effusion, heterogeneous density, may represent hemorrhage, collapsed lung or less likely masses, trauma surgery Dr. Kae Heller was consulted due to these findings.Marland Kitchen  Heterogeneously enlarged right lobe of the thyroid gland with scattered curvilinear calcifications.  Patient was otherwise hemodynamically stable in the ED and required only 2 L of oxygen.  She was admitted under hospitalist service.  Subsequently underwent T10-11 stabilization for unstable T10 fracture on 08/01/2020.  Due to moderate to large right hemothorax, thoracentesis was ordered for her on 08/02/2020 however before having procedure was started in the IR, patient had acute change of mental status and acute hypoxic respiratory failure for which rapid response was called.  Patient was unable to protect her airway so had to be intubated.  Code stroke was called.  She was loaded with Keppra 2 g IV by neurology.  She was transferred  under PCCM.  She had multiple work-up for stroke and she was ruled out of stroke.  She also had EEG and continuous EEG and both of them were negative for any epileptiform activity.  She underwent right-sided thoracentesis with removal of 1800 cc of bloody fluid.  She was subsequently extubated on 08/03/2020 and transferred under [facility] again on 08/04/2020.  On the same day, patient once again went into respiratory distress requiring nonrebreather.  This time this was secondary to acute pulm edema/acute on chronic diastolic congestive heart failure.  She was diuresed.  Has improved over the course of last 3 days.  Currently on room air.  Seen by PT OT who recommended CIR however patient prefers to go to [facility] where her daughter works as a Statistician.  Assessment & Plan:   Active Problems:   Acute renal failure superimposed on stage 3a chronic kidney disease (HCC)   Hyperkalemia   HTN (hypertension)   T10 vertebral fracture (HCC)   Hemothorax on right  Displaced T10 vertebral fracture post mechanical fall at home: Neurosurgery on board and now she is status post T10-11 stabilization for unstable T10 fracture on 08/01/2020.  Has TLSO brace.  Working with PT.  They recommend CIR.  Per neurosurgery note, will need staples removed on 08/15/2020.  appreciate neurosurgery help and defer management to them.  Large exudative right pleural effusion, unclear etiology CT chest without contrast moderate to large right pleural effusion, heterogeneous density, may represent hemorrhage, collapsed lung or less likely masses.  Trauma surgery was consulted initially but there was no comment regarding this on their  note. thoracentesis was ordered for her on 08/02/2020 however before having procedure was started in the IR, patient had acute change of mental status and acute hypoxic respiratory failure for which rapid response was called.  Patient was unable to protect her airway so had to be intubated.  Status  postthoracentesis with removal of 1800 cc of bloody fluid on 08/03/2020.  This is consistent with exudative fluid.  Pleural fluid is showing abundant WBC, predominantly PMNs but no organisms.  Procalcitonin elevated over 16 but patient's leukocytosis resolved, she is afebrile, asymptomatic.  Doubt bacterial pneumonia.  We will continue to watch off antibiotic but keep low threshold to initiate antibiotics if she were to develop any symptoms or has worsening leukocytosis or fever.  Acute hypoxic respiratory failure: Needed intubation and then extubated on 08/03/2020.  Currently on room air.  sudden unresponsiveness with stiffening of BL upper extremities and starring off. CTH with no ICH or large stroke, CTA with no LVO. Loaded with Keppra 2G IV.  EEG is suggestive of moderate diffuse encephalopathy, nonspecific etiology but likely related to sedation.No seizures or epileptiform discharges were seen throughout the recording.  Neurology exam is normal.  She has been taken off of antiseizure medications.  AKI on CKD stage IIIa: Her baseline creatinine seems to be around 1.4.  Trend has been 2.68>2.42> 2.04> 1.59> 1.57> 1.68.  Very close to her baseline.  Continue to avoid nephrotoxic agent.  Acute on chronic diastolic congestive heart failure: Significant improvement, lungs clear to auscultation, she is off of oxygen and saturating 90% on room air.  She remains on room air but she has very faint crackles at the left base.  Continue oral Lasix.  Hyperkalemia: Resolved  Leukocytosis, likely reactive in the setting of fracture, improving.  She is afebrile.  Doubt infection.  Improving.  Monitor.  Essential hypertension: Blood pressure stable.  Continue beta-blocker and clonidine.  Heterogeneously enlarged right lobe of the thyroid gland with scattered curvilinear calcifications.  TSH within normal range.  Ultrasound shows 1 nodule that meets criteria for FNA.  Not urgent at this point in time.  We will  see if this can be done as inpatient once she is more stable.  Otherwise can be done as outpatient.  Type 2 diabetes mellitus: Blood sugar very well controlled.  Continue SSI.  Nausea and vomiting: Resolved.  DVT prophylaxis: SCDs Start: 08/02/20 0750   Code Status: Full Code  Family Communication: None present at bedside.  Plan of care discussed with patient in length and he verbalized understanding and agreed with it.  Status is: Inpatient  Remains inpatient appropriate because:Inpatient level of care appropriate due to severity of illness   Dispo:  Patient From: Home  Planned Disposition: SNF  medically stable for discharge: yes          Estimated body mass index is 32.95 kg/m as calculated from the following:   Height as of this encounter: '5\' 3"'  (1.6 m).   Weight as of this encounter: 84.4 kg.      Nutritional status:               Consultants:   Trauma surgery  Neurosurgery  Neurology  Procedures:   As above  Antimicrobials:  Anti-infectives (From admission, onward)   None         Subjective: Seen and examined.  She feels well.  No shortness of breath and no other complaint.  Objective: Vitals:   08/06/20 2337 08/07/20 0257 08/07/20 0756 08/07/20 1129  BP: (!) 145/61 (!) 123/53 (!) 144/59 (!) 126/58  Pulse: 72 (!) 56 62 67  Resp: '20 17 19 20  ' Temp: 97.9 F (36.6 C) 97.8 F (36.6 C) 97.9 F (36.6 C) 98 F (36.7 C)  TempSrc: Oral Oral Oral Oral  SpO2: 91% 95% 93% 90%  Weight:      Height:        Intake/Output Summary (Last 24 hours) at 08/07/2020 1347 Last data filed at 08/06/2020 2340 Gross per 24 hour  Intake --  Output 550 ml  Net -550 ml   Filed Weights   07/31/20 1708  Weight: 84.4 kg    Examination: General exam: Appears calm and comfortable, obese and deconditioned Respiratory system: Faint crackles at left base. Respiratory effort normal. Cardiovascular system: S1 & S2 heard, RRR. No JVD, murmurs, rubs,  gallops or clicks.  Trace pitting edema bilateral lower extremity Gastrointestinal system: Abdomen is nondistended, soft and nontender. No organomegaly or masses felt. Normal bowel sounds heard. Central nervous system: Alert and oriented. No focal neurological deficits. Extremities: Symmetric 5 x 5 power. Skin: No rashes, lesions or ulcers.  Psychiatry: Judgement and insight appear normal. Mood & affect appropriate.    Data Reviewed: I have personally reviewed following labs and imaging studies  CBC: Recent Labs  Lab 08/02/20 1709 08/03/20 0804 08/05/20 0141 08/06/20 0652 08/07/20 0208  WBC 22.7* 15.9* 13.1* 9.8 10.5  NEUTROABS  --   --  9.6* 7.2 7.2  HGB 9.5* 8.8* 8.7* 8.8* 8.5*  HCT 28.8* 27.9* 27.9* 27.3* 26.6*  MCV 85.5 87.5 88.9 87.8 87.2  PLT 160 151 176 204 222   Basic Metabolic Panel: Recent Labs  Lab 08/01/20 0559 08/01/20 1226 08/03/20 0804 08/04/20 0425 08/05/20 0141 08/06/20 0652 08/07/20 0208  NA 137   < > 139 141 141 141 139  K 5.6*   < > 4.2 4.2 4.4 3.8 3.7  CL 107   < > 111 115* 112* 109 107  CO2 22   < > 20* 21* '24 24 25  ' GLUCOSE 194*   < > 76 101* 102* 100* 96  BUN 68*   < > 72* 60* 58* 62* 68*  CREATININE 2.68*   < > 2.04* 1.59* 1.57* 1.68* 1.76*  CALCIUM 8.7*   < > 7.7* 8.0* 8.1* 8.3* 8.3*  MG 1.9  --  2.0  --   --   --   --   PHOS 6.4*  --   --   --   --   --   --    < > = values in this interval not displayed.   GFR: Estimated Creatinine Clearance: 29.3 mL/min (A) (by C-G formula based on SCr of 1.76 mg/dL (H)). Liver Function Tests: Recent Labs  Lab 08/01/20 0559 08/03/20 1159  AST 21  --   ALT 15  --   ALKPHOS 86  --   BILITOT 0.7  --   PROT 6.4*  --   ALBUMIN 2.7* 2.4*   No results for input(s): LIPASE, AMYLASE in the last 168 hours. No results for input(s): AMMONIA in the last 168 hours. Coagulation Profile: No results for input(s): INR, PROTIME in the last 168 hours. Cardiac Enzymes: No results for input(s): CKTOTAL, CKMB,  CKMBINDEX, TROPONINI in the last 168 hours. BNP (last 3 results) No results for input(s): PROBNP in the last 8760 hours. HbA1C: No results for input(s): HGBA1C in the last 72 hours. CBG: Recent Labs  Lab 08/06/20 1959 08/06/20 2339 08/07/20  0315 08/07/20 0755 08/07/20 1114  GLUCAP 150* 96 96 90 156*   Lipid Profile: Recent Labs    08/06/20 0652  TRIG 221*   Thyroid Function Tests: No results for input(s): TSH, T4TOTAL, FREET4, T3FREE, THYROIDAB in the last 72 hours. Anemia Panel: No results for input(s): VITAMINB12, FOLATE, FERRITIN, TIBC, IRON, RETICCTPCT in the last 72 hours. Sepsis Labs: Recent Labs  Lab 08/01/20 0018 08/05/20 0141  PROCALCITON  --  16.21  LATICACIDVEN 1.7  --     Recent Results (from the past 240 hour(s))  Resp Panel by RT-PCR (Flu A&B, Covid) Nasopharyngeal Swab     Status: None   Collection Time: 07/31/20 11:08 PM   Specimen: Nasopharyngeal Swab; Nasopharyngeal(NP) swabs in vial transport medium  Result Value Ref Range Status   SARS Coronavirus 2 by RT PCR NEGATIVE NEGATIVE Final    Comment: (NOTE) SARS-CoV-2 target nucleic acids are NOT DETECTED.  The SARS-CoV-2 RNA is generally detectable in upper respiratory specimens during the acute phase of infection. The lowest concentration of SARS-CoV-2 viral copies this assay can detect is 138 copies/mL. A negative result does not preclude SARS-Cov-2 infection and should not be used as the sole basis for treatment or other patient management decisions. A negative result may occur with  improper specimen collection/handling, submission of specimen other than nasopharyngeal swab, presence of viral mutation(s) within the areas targeted by this assay, and inadequate number of viral copies(<138 copies/mL). A negative result must be combined with clinical observations, patient history, and epidemiological information. The expected result is Negative.  Fact Sheet for Patients:   EntrepreneurPulse.com.au  Fact Sheet for Healthcare Providers:  IncredibleEmployment.be  This test is no t yet approved or cleared by the [place] FDA and  has been authorized for detection and/or diagnosis of SARS-CoV-2 by FDA under an Emergency Use Authorization (EUA). This EUA will remain  in effect (meaning this test can be used) for the duration of the COVID-19 declaration under Section 564(b)(1) of the Act, 21 U.S.C.section 360bbb-3(b)(1), unless the authorization is terminated  or revoked sooner.       Influenza A by PCR NEGATIVE NEGATIVE Final   Influenza B by PCR NEGATIVE NEGATIVE Final    Comment: (NOTE) The Xpert Xpress SARS-CoV-2/FLU/RSV plus assay is intended as an aid in the diagnosis of influenza from Nasopharyngeal swab specimens and should not be used as a sole basis for treatment. Nasal washings and aspirates are unacceptable for Xpert Xpress SARS-CoV-2/FLU/RSV testing.  Fact Sheet for Patients: EntrepreneurPulse.com.au  Fact Sheet for Healthcare Providers: IncredibleEmployment.be  This test is not yet approved or cleared by the [place] FDA and has been authorized for detection and/or diagnosis of SARS-CoV-2 by FDA under an Emergency Use Authorization (EUA). This EUA will remain in effect (meaning this test can be used) for the duration of the COVID-19 declaration under Section 564(b)(1) of the Act, 21 U.S.C. section 360bbb-3(b)(1), unless the authorization is terminated or revoked.  Performed at [facility], [address] [place], [place] [address]   Culture, blood (Routine X 2) w Reflex to ID Panel     Status: None   Collection Time: 08/01/20 12:05 AM   Specimen: BLOOD  Result Value Ref Range Status   Specimen Description   Final    BLOOD RIGHT ANTECUBITAL Performed at [facility], [address], [place], [place] [address]     Special Requests   Final    NONE Performed at [facility], [address] [place],  [place] [address]    Culture   Final    NO GROWTH 5 DAYS Performed at [facility], [place] [address], [place], [place] [address]    Report Status 08/06/2020 FINAL  Final  Culture, blood (Routine X 2) w Reflex to ID Panel     Status: None   Collection Time: 08/01/20 12:10 AM   Specimen: BLOOD  Result Value Ref Range Status   Specimen Description   Final    BLOOD LEFT ANTECUBITAL Performed at [facility], [address], [place], [place] [address]    Special Requests   Final    NONE Performed at [facility], [address] [place], [place] [address]    Culture   Final    NO GROWTH 5 DAYS Performed at [facility], [place] [address] [place], [place] [address]    Report Status 08/06/2020 FINAL  Final  MRSA PCR Screening     Status: None   Collection Time: 08/01/20  2:59 AM   Specimen: Nasal Mucosa; Nasopharyngeal  Result Value Ref Range Status   MRSA by PCR NEGATIVE NEGATIVE Final    Comment:        The GeneXpert MRSA Assay (FDA approved for NASAL specimens only), is one component of a comprehensive MRSA colonization surveillance program. It is not intended to diagnose MRSA infection nor to guide or monitor treatment for MRSA infections. Performed at [facility], [place] [address] [place], [place] [address]   Body fluid culture w Gram Stain     Status: None   Collection Time: 08/03/20 11:30 AM   Specimen: Fluid  Result Value Ref Range Status   Specimen Description FLUID RIGHT PLEURAL  Final   Special Requests NONE  Final   Gram Stain   Final    ABUNDANT WBC PRESENT, PREDOMINANTLY PMN NO ORGANISMS SEEN    Culture   Final    NO GROWTH 3 DAYS Performed at [facility], [place] [address] [place],  [address]    Report Status 08/06/2020 FINAL  Final      Radiology Studies: No results  found.  Scheduled Meds: . atenolol  100 mg Oral Daily  . chlorhexidine gluconate (MEDLINE KIT)  15 mL Mouth Rinse BID  . Chlorhexidine Gluconate Cloth  6 each Topical Daily  . cloNIDine  0.2 mg Oral BID  . dextromethorphan-guaiFENesin  1 tablet Oral BID  . furosemide  40 mg Oral Daily  . insulin aspart  0-9 Units Subcutaneous Q4H  . polyethylene glycol  17 g Oral Daily  . senna-docusate  2 tablet Oral QHS   Continuous Infusions:    LOS: 6 days   Time spent: 28 minutes   Darliss Cheney, MD [facility]  08/07/2020, 1:47 PM   How to contact the [facility] Attending or Consulting provider [place] or covering provider during after hours [place], for this patient?  1. Check the care team in [facility] and look for a) attending/consulting TRH provider listed and b) the [facility] team listed. Page or secure chat [phone]. 2. Log into [email or web address] and use [place]'s universal password to access. If you do not have the password, please contact the hospital operator. 3. Locate the [facility] provider you are looking for under [facility] and page to a number that you can be directly reached. 4. If you still have difficulty reaching the provider, please page the [facility] (Director on Call) for the Hospitalists listed on amion for assistance.

## 2020-08-08 DIAGNOSIS — W19XXXA Unspecified fall, initial encounter: Secondary | ICD-10-CM

## 2020-08-08 LAB — CBC WITH DIFFERENTIAL/PLATELET
Abs Immature Granulocytes: 0.13 10*3/uL — ABNORMAL HIGH (ref 0.00–0.07)
Basophils Absolute: 0.1 10*3/uL (ref 0.0–0.1)
Basophils Relative: 0 %
Eosinophils Absolute: 0.3 10*3/uL (ref 0.0–0.5)
Eosinophils Relative: 3 %
HCT: 26.9 % — ABNORMAL LOW (ref 36.0–46.0)
Hemoglobin: 8.5 g/dL — ABNORMAL LOW (ref 12.0–15.0)
Immature Granulocytes: 1 %
Lymphocytes Relative: 13 %
Lymphs Abs: 1.6 10*3/uL (ref 0.7–4.0)
MCH: 27.8 pg (ref 26.0–34.0)
MCHC: 31.6 g/dL (ref 30.0–36.0)
MCV: 87.9 fL (ref 80.0–100.0)
Monocytes Absolute: 1 10*3/uL (ref 0.1–1.0)
Monocytes Relative: 8 %
Neutro Abs: 9.3 10*3/uL — ABNORMAL HIGH (ref 1.7–7.7)
Neutrophils Relative %: 75 %
Platelets: 237 10*3/uL (ref 150–400)
RBC: 3.06 MIL/uL — ABNORMAL LOW (ref 3.87–5.11)
RDW: 16.4 % — ABNORMAL HIGH (ref 11.5–15.5)
WBC: 12.4 10*3/uL — ABNORMAL HIGH (ref 4.0–10.5)
nRBC: 0 % (ref 0.0–0.2)

## 2020-08-08 LAB — GLUCOSE, CAPILLARY
Glucose-Capillary: 129 mg/dL — ABNORMAL HIGH (ref 70–99)
Glucose-Capillary: 105 mg/dL — ABNORMAL HIGH (ref 70–99)
Glucose-Capillary: 179 mg/dL — ABNORMAL HIGH (ref 70–99)

## 2020-08-08 LAB — BASIC METABOLIC PANEL
Anion gap: 5 (ref 5–15)
BUN: 62 mg/dL — ABNORMAL HIGH (ref 8–23)
CO2: 26 mmol/L (ref 22–32)
Calcium: 8.5 mg/dL — ABNORMAL LOW (ref 8.9–10.3)
Chloride: 106 mmol/L (ref 98–111)
Creatinine, Ser: 1.53 mg/dL — ABNORMAL HIGH (ref 0.44–1.00)
GFR, Estimated: 36 mL/min — ABNORMAL LOW (ref >60)
Glucose, Bld: 113 mg/dL — ABNORMAL HIGH (ref 70–99)
Potassium: 3.5 mmol/L (ref 3.5–5.1)
Sodium: 137 mmol/L (ref 135–145)

## 2020-08-08 LAB — SARS CORONAVIRUS 2 (TAT 6-24 HRS): SARS Coronavirus 2: NEGATIVE

## 2020-08-08 MED ORDER — CLONIDINE HCL 0.2 MG PO TABS: 0.2000 mg | ORAL_TABLET | Freq: Two times a day (BID) | ORAL | 1 refills | Status: DC

## 2020-08-08 MED ORDER — MOMETASONE FUROATE 0.1 % EX CREA: 1.0000 "application " | TOPICAL_CREAM | Freq: Every day | CUTANEOUS | 0 refills | Status: DC | PRN

## 2020-08-08 MED ORDER — BENZONATATE 100 MG PO CAPS
100.0000 mg | ORAL_CAPSULE | Freq: Three times a day (TID) | ORAL | Status: DC
  Administered 2020-08-08 (×2): 100 mg via ORAL
  Filled 2020-08-08 (×2): qty 1

## 2020-08-08 MED ORDER — POLYETHYLENE GLYCOL 3350 17 G PO PACK: 17.0000 g | PACK | Freq: Every day | ORAL | 0 refills | Status: DC

## 2020-08-08 MED ORDER — HYDROCODONE-ACETAMINOPHEN 5-325 MG PO TABS: 2.0000 | ORAL_TABLET | Freq: Four times a day (QID) | ORAL | 0 refills | Status: DC | PRN

## 2020-08-08 MED ORDER — PHENOL 1.4 % MT LIQD: 1.0000 | OROMUCOSAL | 0 refills | Status: DC | PRN

## 2020-08-08 MED ORDER — BENZONATATE 100 MG PO CAPS: 100.0000 mg | ORAL_CAPSULE | Freq: Three times a day (TID) | ORAL | 0 refills | Status: DC

## 2020-08-08 NOTE — Op Note (Signed)
NEUROSURGERY OPERATIVE NOTE   PREOP DIAGNOSIS:  1. Unstable T10 Fracture   POSTOP DIAGNOSIS: Same  PROCEDURE: 1. Open reduction, internal fixation T10 fracture 2. Posterior segmental instrumentation, T8-T11 Medtronic Solera pedicle screws 3. Use of intraoperative stereotaxy  SURGEON: Dr. Consuella Lose, MD  ASSISTANT: Ferne Reus, PA-C  ANESTHESIA: General Endotracheal  EBL: 200cc  SPECIMENS: None  DRAINS: None   COMPLICATIONS: None immediate  CONDITION: Hemodynamically stable to PACU  HISTORY: Jacqueline Richmond is a 75 y.o. female admitted after a fall at home.  She had significant back pain and work-up included CT scan of the abdomen and pelvis demonstrating an unstable fracture obliquely through the T10 vertebral body consistent with a hyperextension type mechanism.  CT scan was also significant for flowing osteophytes throughout the thoracic spine suggestive of a spondyloarthropathy.  Given the morphology of the fracture this was felt to be unstable with high risk of subsequent kyphosis.  Surgical stabilization was therefore indicated.  The risks, benefits, and alternatives to surgery were all reviewed in detail with the patient.  After all her questions were answered informed consent was obtained and witnessed.  PROCEDURE IN DETAIL: The patient was brought to the operating room. After induction of general anesthesia, the patient was found to be somewhat unstable with very labile blood pressure.  Anesthesia service did administer fluids and the patient's blood pressure and heart rate stabilized.  We therefore elected to proceed with surgery.  The patient was positioned on the operative table in the prone position. All pressure points were meticulously padded.  Midline skin incision was then marked out and prepped and draped in the usual sterile fashion.  After timeout was conducted, a spinal needle was introduced in order to identify the surface projection of the T7  spinous process.  Incision was then made sharply in the midline after infiltration with local anesthetic with epinephrine.  Dissection was carried down through the thoracodorsal fascia and the T7 spinous process was identified.  Spinous process clamp was then affixed and fluoroscopy was again taken to confirm our location at T7.  At this point, the incision was carried inferiorly and the skin was undermined just above the thoracodorsal fascia.  The patient was then draped out and the O-arm was brought in and a registration CT scan was obtained.  The images were reviewed and did encompass the desired levels from T8-T11 in the field-of-view.  At this point the stereotactic system was used to identify entry points for bilateral T11, T10, T9, and T8 pedicle screws.  Fascial incisions were then made overlying each of the entry points, high-speed drill was used to create a small puncture within the cortex, and a single step awl-tap instrument was used all under stereotactic guidance to create pilot holes for the pedicle screws at the above levels.  Correctly sized pedicle screws were then placed in T11, T10, and T9.  Due to the very small size of the T9 pedicles I elected to also place screws at T8 in the same fashion.  Once the pedicle screws were placed, intraoperative fluoroscopy in both AP and lateral projection was taken to confirm excellent positioning of the screws.  Spinal alignment appeared to be well-maintained.  Rod was then sized appropriately and passed superiorly to inferiorly underneath the fascia.  Setscrews were then placed and final tightened.  Reduction towers were then removed.  The fascial incisions were then closed with interrupted 0 Vicryl stitches.  The deep subcutaneous layer was closed with interrupted 0 Vicryl stitches  and the skin was closed with staples.  Bacitracin ointment and sterile dressing was applied.  At the end of the case all sponge, needle, and instrument counts were  correct. The patient was then transferred to the stretcher, extubated, and taken to the post-anesthesia care unit in stable hemodynamic condition.   Consuella Lose, MD [facility]

## 2020-08-08 NOTE — Progress Notes (Signed)
6 staples were removed from back of pt's scalp by this RN per order.   Justice Rocher, RN

## 2020-08-08 NOTE — Progress Notes (Addendum)
Report called to [place] at [facility].  IV removed.  Written prescription for Hydrocodone included in information packet to be sent to [facility] via [facility].

## 2020-08-08 NOTE — TOC Transition Note (Signed)
Transition of Care [facility]) - CM/SW Discharge Note   Patient Details  Name: Jacqueline Richmond MRN: [id number] Date of Birth: [birth year]  Transition of Care [facility]) CM/SW Contact:  Vinie Sill, LCSW Phone Number: 08/08/2020, 2:26 PM   Clinical Narrative:     Patient will Discharge to:  [facility] Discharge Date: 08/09/2019 Family Notified:spouse Transport WU:[id number]  Per MD patient is ready for discharge. RN, patient, and facility notified of discharge. Discharge Summary sent to facility. RN given number for [phone]. Ambulance transport requested for patient.   Clinical Social Worker signing off.  Thurmond Butts, MSW, LCSW Clinical Social Worker    Final next level of care: Skilled Nursing Facility Barriers to Discharge: Barriers Resolved   Patient Goals and CMS Choice        Discharge Placement              Patient chooses bed at: [facility] Patient to be transferred to facility by: [place] Name of family member notified: Spouse Patient and family notified of of transfer: 08/08/20  Discharge Plan and Services In-house Referral: Clinical Social Work                                   Social Determinants of Health (SDOH) Interventions     Readmission Risk Interventions No flowsheet data found.

## 2020-08-08 NOTE — Progress Notes (Signed)
Pt's O2 Sats 90-92% on RA with exertion.  Pt only able to walk short distance in room due to complaints of mild dizziness.

## 2020-08-08 NOTE — Discharge Summary (Signed)
[facility] Discharge Summary   Patient: Jacqueline Richmond ZGY:[id number]  PCP: Bernerd Limbo, MD  Date of admission: 07/31/2020   Date of discharge:  08/08/2020     Discharge Diagnoses:  Principal diagnosis Fall with unstable T10 vertebral fracture Active Problems:   Acute renal failure superimposed on stage 3a chronic kidney disease (HCC)   Hyperkalemia   HTN (hypertension)   T10 vertebral fracture ([place])   Hemothorax on right  Admitted From: home Disposition:  SNF   Recommendations for Outpatient Follow-up:  1. PCP: follow up in 1 week 2. Follow up with neurosurgery in 2 week. Suture removal on lumbar incision on 08/15/2020.  3. Monitor scalp laceration. Can wash 4. Follow up LABS/TEST:  none 5. New Meds: tessalon, norco, miralax 6. Changed meds: clonidine dose reduced from 0.3 to 0.2 7. Stopped meds: aspirin, and MVI   Follow-up Information    Bernerd Limbo, MD. Schedule an appointment as soon as possible for a visit in 1 week(s).   Specialty: Family Medicine Contact information: [address] [place] [address] [phone]        Traci Sermon, [place]. Schedule an appointment as soon as possible for a visit in 2 week(s).   Specialty: Physician Assistant Contact information: [place] [address] [phone]              Discharge Instructions    Compression stockings   Complete by: As directed    Diet - low sodium heart healthy   Complete by: As directed    Increase activity slowly   Complete by: As directed    Leave dressing on - Keep it clean, dry, and intact until clinic visit   Complete by: As directed    Suture removal on lumbar incision 08/15/2020.   No wound care   Complete by: As directed       Diet recommendation: Cardiac diet  Activity: The patient is advised to gradually reintroduce usual activities, as tolerated  Discharge Condition: stable  Code Status: Full code    History of present illness: As per the H and P dictated on admission, "Jacqueline Richmond is a 75 y.o. female with medical history significant for type 2 diabetes, essential hypertension, CKD 4, who presented to [facility] ED after a mechanical fall at home.  She reports that she was sitting in a rocking chair in her porch for 1 to 2 hours then she got up loss of balance and fell on the steps hitting her head.  She denies loss of consciousness.  She presented to the ED for further evaluation.  CT head nonacute.  CT chest without contrast showed extension injury of the thoracic spine with displaced T10 vertebral body fracture extending into the T9-T10 disc space and anterior vertebral body.  Fracture extends through anterior osteophytes as well as the anterior superior aspect of T11 vertebral body.  There is adjacent paravertebral hemorrhage and small volume of gas likely related to escape vacuum phenomenon.  Neurosurgery was consulted due to these findings.  Also showed on CT chest without contrast moderate to large right pleural effusion, heterogeneous density, may represent hemorrhage, collapsed lung or less likely masses, trauma surgery Dr. Kae Heller was consulted due to these findings.Marland Kitchen  Heterogeneously enlarged right lobe of the thyroid gland with scattered curvilinear calcifications.  Recommend thyroid ultrasound.  TR[facility], hospitalist team, was asked to admit.  Patient was accepted as a direct admit to [facility] telemetry medical unit."  Hospital Course:  Presented to [facility] ED after a mechanical fall at home. Underwent T8-11 stabilization for unstable T10 fracture on 08/01/2020.  Due to moderate to large right hemothorax, thoracentesis was ordered for her on 08/02/2020 however before having procedure was started in the IR, patient had acute change of mental status and acute hypoxic respiratory failure for which rapid response was called.  Patient was unable to protect her airway so had to be intubated.  Code stroke  was called.  She was loaded with Keppra 2 g IV by neurology.  She was transferred under PCCM.  She ruled out of stroke. EEG and continuous EEG and both of them were negative for any epileptiform activity.  She underwent right-sided thoracentesis with removal of 1800 cc of bloody fluid.  She was subsequently extubated on 08/03/2020 and transferred under [place] again on 08/04/2020.  On the same day, patient once again went into respiratory distress requiring nonrebreather.  This time this was secondary to acute pulm edema/acute on chronic diastolic congestive heart failure.  She was diuresed.  Has improved over the course of last 3 days.  Currently on room air.  Seen by PT OT who recommended CIR however patient prefers to go to [facility] where her daughter works as a Statistician.  Summary of her active problems in the hospital is as following.   Displaced T10 vertebral fracture post mechanical fall at home: Neurosurgery on board and now she is status post T10-11 stabilization for unstable T10 fracture on 08/01/2020.   Has TLSO brace. Wear when upright and ambulating.  Working with PT. They recommend CIR. Pt preferred to go to SNF.  Per neurosurgery note, will need staples removed on 08/15/2020.   appreciate neurosurgery help and defer management to them.  Large exudative right pleural effusion, unclear etiology CT chest without contrast moderate to large right pleural effusion, heterogeneous density, may represent hemorrhage, collapsed lung or less likely masses.  Trauma surgery was consulted initially but there was no comment regarding this on their note.  Thoracentesis was ordered for her on 08/02/2020 however before having procedure was started in the IR, patient had acute change of mental status and acute hypoxic respiratory failure for which rapid response was called.  Patient was unable to protect her airway so had to be intubated. Status post thoracentesis with removal of 1800 cc of bloody  fluid on 08/03/2020.  This is consistent with exudative fluid.  Pleural fluid is showing abundant WBC, predominantly PMNs but no organisms. Procalcitonin elevated over 16 but patient's leukocytosis resolved, she is afebrile, asymptomatic.  Doubt bacterial pneumonia.  Pt was watched, off antibiotic but no new symptoms or worsening leukocytosis or fever.  Acute hypoxic respiratory failure:  Needed intubation and then extubated on 08/03/2020.  Currently on room air.  Seizure like event Likely syncope Dizziness Sudden unresponsiveness with stiffening of BL upper extremities and starring off. CTH with no ICH or large stroke, CTA with no LVO. Loaded with Keppra 2G IV.  EEGis suggestive of moderate diffuse encephalopathy, nonspecific etiology but likely related to sedation.No seizures or epileptiform discharges were seen throughout the recording.  Neurology consulted, recommended no further work and and no AEDs.  Exam is normal.  She has been taken off of antiseizure medications.  AKI on CKD stage IIIa:  Her baseline creatinine seems to be around 1.4.   Trend has been 2.68>2.42> 2.04> 1.59> 1.57> 1.68.  Very close to her baseline.  Continue to avoid nephrotoxic agent.  Acute on chronic  diastolic congestive heart failure:  Significant improvement, lungs clear to auscultation, she is off of oxygen and saturating 90% on room air.  She remains on room air but she has very faint crackles at the left base.  Continue oral Lasix.  Hyperkalemia:  Resolved  Leukocytosis,  likely reactive in the setting of fracture, improving.  She is afebrile. Doubt infection.  Improving.  Monitor.  Essential hypertension:  Blood pressure stable.  Continue beta-blocker and clonidine. Dose adjusted to BP.   Heterogeneously enlarged right lobe of the thyroid gland with scattered curvilinear calcifications. TSH within normal range.   Ultrasound shows 1 nodule that meets criteria for FNA.  Not urgent at this  point in time. Can be done as outpatient.  Type 2 diabetes mellitus Blood sugar very well controlled.   Nausea and vomiting:  Resolved.  Obesity  Placing the pt at higher risk of poor outcome.  Body mass index is 32.95 kg/m.   Pain control  - [employer] Controlled Substance Reporting System database was reviewed. - 5 day supply was provided. - Patient was instructed, not to drive, operate heavy machinery, perform activities at heights, swimming or participation in water activities or provide baby sitting services while on Pain, Sleep and Anxiety Medications; until her outpatient Physician has advised to do so again.  - Also recommended to not to take more than prescribed Pain, Sleep and Anxiety Medications.  Patient was seen by physical therapy, who recommended SNF. On the day of the discharge the patient's vitals were stable, and no other acute medical condition were reported by patient. The patient was felt safe to be discharge at SNF with Therapy.  Consultants: neurosurgery, neurology, PCCM, trauma surgery, CIR Procedures: scalp laceration repair S/p T8-T11 fusion for T10 fracture  DISCHARGE MEDICATION: Allergies as of 08/08/2020      Reactions   Codeine Itching   Iodinated Diagnostic Agents    Other reaction(s): Unknown   Pentazocine Other (See Comments)   Hallucinations   Statins       Medication List    STOP taking these medications   aspirin EC 81 MG tablet   CINNAMON PO   doxycycline 100 MG tablet Commonly known as: VIBRA-TABS   fenofibrate 54 MG tablet   Garlic 5993 MG Caps   Kerendia 10 MG Tabs Generic drug: Finerenone     TAKE these medications   allopurinol 300 MG tablet Commonly known as: ZYLOPRIM Take 1 tablet (300 mg total) by mouth daily.   atenolol 100 MG tablet Commonly known as: TENORMIN Take 100 mg by mouth daily.   b complex vitamins tablet Take 1 tablet by mouth daily.   benzonatate 100 MG capsule Commonly known as:  TESSALON Take 1 capsule (100 mg total) by mouth 3 (three) times daily.   cloNIDine 0.2 MG tablet Commonly known as: CATAPRES Take 1 tablet (0.2 mg total) by mouth 2 (two) times daily. What changed:   medication strength  how much to take   furosemide 40 MG tablet Commonly known as: LASIX Take 40 mg by mouth daily.   glipiZIDE 10 MG 24 hr tablet Commonly known as: GLUCOTROL XL Take 10 mg by mouth daily with breakfast.   HYDROcodone-acetaminophen 5-325 MG tablet Commonly known as: NORCO/VICODIN Take 2 tablets by mouth every 6 (six) hours as needed for moderate pain or severe pain.   loratadine 10 MG tablet Commonly known as: CLARITIN Take 10 mg by mouth daily.   mometasone 0.1 % cream Commonly known as: ELOCON Apply  1 application topically daily as needed (psoriasis).   niacin 500 MG tablet Take 500 mg by mouth 2 (two) times daily with a meal. Reported on 06/19/2015   Omega-3 1000 MG Caps Take 1,000 mg by mouth 2 (two) times daily.   phenol 1.4 % Liqd Commonly known as: CHLORASEPTIC Use as directed 1 spray in the mouth or throat as needed for throat irritation / pain.   polyethylene glycol 17 g packet Commonly known as: MIRALAX / GLYCOLAX Take 17 g by mouth daily.   sitaGLIPtin 50 MG tablet Commonly known as: JANUVIA Take 50 mg by mouth daily.            Discharge Care Instructions  (From admission, onward)         Start     Ordered   08/08/20 0000  Leave dressing on - Keep it clean, dry, and intact until clinic visit       Comments: Suture removal on lumbar incision 08/15/2020.   08/08/20 1222          Discharge Exam: Filed Weights   07/31/20 1708  Weight: 84.4 kg   Vitals:   08/08/20 0742 08/08/20 1147  BP: (!) 148/57 (!) 149/70  Pulse: 64 76  Resp: 18 20  Temp: 98.5 F (36.9 C) 98.1 F (36.7 C)  SpO2: 94% 93%   General: Appear in mild distress, no Rash; Oral Mucosa Clear, moist. no Abnormal Neck Mass Or lumps, Conjunctiva normal   Cardiovascular: S1 and S2 Present, no Murmur, Respiratory: good respiratory effort, Bilateral Air entry present and CTA, no Crackles, no wheezes Abdomen: Bowel Sound present, Soft and no tenderness Extremities: no Pedal edema Neurology: alert and oriented to time, place, and person affect appropriate. no new focal deficit Gait not checked due to patient safety concerns    The results of significant diagnostics from this hospitalization (including imaging, microbiology, ancillary and laboratory) are listed below for reference.    Significant Diagnostic Studies: CT ABDOMEN PELVIS WO CONTRAST  Result Date: 08/02/2020 CLINICAL DATA:  Nausea and vomiting. EXAM: CT ABDOMEN AND PELVIS WITHOUT CONTRAST TECHNIQUE: Multidetector CT imaging of the abdomen and pelvis was performed following the standard protocol without IV contrast. COMPARISON:  X-ray abdomen 08/01/2020 FINDINGS: Lower chest: Interval increase in size of a heterogeneous moderate to large volume right pleural effusion. Associated complete collapse of the right lower lobe. Trace left pleural effusion. Hepatobiliary: No focal liver abnormality is seen. Status post cholecystectomy. No biliary dilatation. Pancreas: No focal lesion. Normal pancreatic contour. No surrounding inflammatory changes. No main pancreatic ductal dilatation. Spleen: Normal in size without focal abnormality. Adrenals/Urinary Tract: No adrenal nodule bilaterally. Nonspecific bilateral perinephric stranding. Bilateral renal calcifications measuring up to 5 mm. No hydronephrosis. Exophytic 1.7 cm fluid density lesion within the right kidney likely represents a simple renal cyst. Otherwise no contour-deforming renal mass. No ureterolithiasis or hydroureter. Limited evaluation of distal ureters due to streak artifact originating from the right femoral surgical hardware. The urinary bladder is decompressed with a Foley catheter terminating within its lumen. There is limited evaluation  of the urinary bladder due to streak artifact originating from the right surgical femoral hardware. Stomach/Bowel: Enteric tube coursing within the gastric lumen with side port just distal to the gastroesophageal junction. Stomach is within normal limits. No evidence of bowel wall thickening or dilatation. Scattered colonic diverticulosis. Appendix appears normal. Vascular/Lymphatic: No abdominal aorta or iliac aneurysm. Moderate atherosclerotic plaque of the aorta and its branches. No abdominal, pelvic, or inguinal lymphadenopathy. Reproductive:  Uterus and bilateral adnexa are unremarkable. Other: No intraperitoneal free fluid. No intraperitoneal free gas. No organized fluid collection. Musculoskeletal: Anterior abdominal hernia repair with mesh. Interval placement of partially visualized posterolateral thoracic fusion surgical hardware in a patient with T10-T11 acute vertebral body fracture with extension to the posterior elements. Slight angulation of the left T11 (3:12) surgical hardware with no violation of the spinal canal or findings to suggest impingement on the nerve roots. Overlying skin staples and subcutaneus soft tissue edema. Multilevel severe degenerative changes of the spine with fusion of the L3 through L5 vertebral bodies leading to straightening of the normal lumbar lordosis. Mild retrolisthesis of L5 on S1. No suspicious lytic or blastic osseous lesions. Total right hip arthroplasty.  Degenerative changes of the left hip. IMPRESSION: 1. Interval increase in size of a heterogeneous moderate to large volume right pleural effusion. Areas of increased density within the pleural fluid may represent hemothorax, collapsed right lower lobe lung, or less likely a mass lesion. 2. Interval development of trace left pleural effusion. 3. Nonspecific bilateral perinephric stranding. Consider correlation with urinalysis for infection. 4. Bilateral nonobstructive nephrolithiasis measuring up to 5 mm. 5.  Scattered colonic diverticulosis with no acute diverticulitis. 6. Known T10-T11 fracture with interval placement of partially visualized posterolateral thoracic fusion surgical hardware. 7. Severe degenerative changes of the lumbar spine with L3 through L5 vertebral body fusion. 8.  Aortic Atherosclerosis (ICD10-I70.0). Electronically Signed   By: Iven Finn M.D.   On: 08/02/2020 00:09   DG Ribs Bilateral W/Chest  Result Date: 07/31/2020 CLINICAL DATA:  Fall.  Pain. EXAM: BILATERAL RIBS AND CHEST - 4+ VIEW COMPARISON:  Remote chest radiograph 05/03/2012. No interval chest imaging. FINDINGS: No fracture or other bone lesions are seen involving the ribs. Right pleural effusion at least moderate in size. No pneumothorax. Heart size and mediastinal contours are within normal limits. Aortic atherosclerosis. IMPRESSION: 1. No evidence of rib fracture. 2. Right pleural effusion at least moderate in size. This is of unknown acuity. Electronically Signed   By: Keith Rake M.D.   On: 07/31/2020 20:39   DG Thoracic Spine 2 View  Result Date: 07/31/2020 CLINICAL DATA:  Unwitnessed fall.  Pain. EXAM: THORACIC SPINE 2 VIEWS COMPARISON:  None. FINDINGS: Broad-based dextroscoliotic curvature. No evidence of acute fracture or compression deformity. Flowing anterior osteophytes throughout the thoracic spine. Minimal disc space narrowing at T10-T11 and T11-T12. no paravertebral soft tissue abnormality IMPRESSION: 1. No radiographic evidence of acute fracture of the thoracic spine. 2. Scoliosis and degenerative change. Electronically Signed   By: Keith Rake M.D.   On: 07/31/2020 20:36   DG THORACOLUMABAR SPINE  Result Date: 08/01/2020 CLINICAL DATA:  T8 through 11 fusion. EXAM: THORACOLUMBAR SPINE 1V COMPARISON:  Preoperative imaging. FINDINGS: Two fluoroscopic spot views of the thoracic spine obtained in frontal and lateral projections. Pedicle rods in the lower thoracic spine, levels difficult to  accurately delineate on coned view. Total fluoroscopy time 20 seconds. Total dose 7.78 mGy. IMPRESSION: Intraoperative fluoroscopy during thoracic fusion. Electronically Signed   By: Keith Rake M.D.   On: 08/01/2020 16:16   DG Lumbar Spine Complete  Result Date: 07/31/2020 CLINICAL DATA:  Unwitnessed fall.  Pain. EXAM: LUMBAR SPINE - COMPLETE 4+ VIEW COMPARISON:  None. FINDINGS: Five non-rib-bearing lumbar vertebra. Straightening of normal lordosis. Near complete disc space loss at L4-L5 with prominent disc space narrowing at L3-L4 and L5-S1. Endplate spurring with lesser disc space narrowing at L1-L2 and L2-L3. Multilevel facet hypertrophy. Vertebral body heights  are preserved. No evidence of fracture. Degenerative change of both sacroiliac joints. IMPRESSION: 1. No fracture of the lumbar spine. 2. Straightening of normal lordosis may be positioning or muscle spasm. Multilevel degenerative disc disease and facet hypertrophy. Electronically Signed   By: Keith Rake M.D.   On: 07/31/2020 20:37   DG Forearm Left  Result Date: 08/01/2020 CLINICAL DATA:  Fall yesterday with swelling. EXAM: LEFT FOREARM - 2 VIEW COMPARISON:  None. FINDINGS: Advanced degenerative changes involving the carpal bones and radiocarpal articulation. There is also more mild to moderate degenerative change about the elbow. No acute fracture or dislocation. No elbow joint effusion. Suboptimal patient positioning secondary to clinical status. Possible soft tissue swelling adjacent the proximal ulna. IMPRESSION: Advanced degenerative change, without acute osseous abnormality. Electronically Signed   By: Abigail Miyamoto M.D.   On: 08/01/2020 13:48   CT Head Wo Contrast  Result Date: 07/31/2020 CLINICAL DATA:  Recent fall from chair, initial encounter EXAM: CT HEAD WITHOUT CONTRAST CT CERVICAL SPINE WITHOUT CONTRAST TECHNIQUE: Multidetector CT imaging of the head and cervical spine was performed following the standard protocol  without intravenous contrast. Multiplanar CT image reconstructions of the cervical spine were also generated. COMPARISON:  None. FINDINGS: CT HEAD FINDINGS Brain: No evidence of acute infarction, hemorrhage, hydrocephalus, extra-axial collection or mass lesion/mass effect. Calcification is noted in the midportion of the pons. Mild chronic white matter ischemic changes are noted. Vascular: No hyperdense vessel or unexpected calcification. Skull: Normal. Negative for fracture or focal lesion. Sinuses/Orbits: No acute finding. Other: Mild parietal scalp hematoma is noted in the vertex on the left. CT CERVICAL SPINE FINDINGS Alignment: Mild loss of normal cervical lordosis is noted likely related to muscular spasm Skull base and vertebrae: 7 cervical segments are well visualized. Vertebral body height is well maintained. Multilevel osteophytic changes and facet hypertrophic changes are noted. No acute fracture or acute facet abnormality is seen. Soft tissues and spinal canal: Surrounding soft tissue structures show vascular calcifications. Enlargement of the right lobe of the thyroid is noted with evidence of calcifications and a nodule which measures at least 2.5 cm. No other soft tissue abnormality is noted. Upper chest: Visualized lung apices demonstrate evidence of a right-sided pleural effusion. This is of uncertain chronicity. No rib abnormality is noted. Other: None IMPRESSION: CT of the head: Left parietal scalp hematoma. No acute intracranial abnormality is noted. CT of the cervical spine: Multilevel degenerative changes without acute bony abnormality. Right-sided pleural effusion. Prominent right lobe of the thyroid with at least 1 nodule within. Recommend nonemergent thyroid US (ref: J Am Coll Radiol. 2015 Feb;12(2): 143-50). Electronically Signed   By: Inez Catalina M.D.   On: 07/31/2020 18:10   CT Chest Wo Contrast  Result Date: 07/31/2020 CLINICAL DATA:  Fall.  Chest trauma. EXAM: CT CHEST WITHOUT  CONTRAST TECHNIQUE: Multidetector CT imaging of the chest was performed following the standard protocol without IV contrast. COMPARISON:  Ribbon thoracic spine radiographs earlier today. FINDINGS: Cardiovascular: Moderate aortic atherosclerosis. No periaortic stranding to suggest injury. There are coronary artery calcifications. Heart is normal in size, slightly displaced into the left chest. No pericardial effusion. Mediastinum/Nodes: Scattered small mediastinal lymph nodes, not enlarged by size criteria. No esophageal wall thickening. Heterogeneously enlarged right lobe of the thyroid gland with scattered curvilinear calcifications. Included left thyroid lobe is normal. Lungs/Pleura: Moderate to large right pleural effusion, heterogeneous density. Areas of increased density within the pleural fluid may represent hemorrhage, collapsed lung or less likely masses. Small amount  of pleural fluid appears loculated and tracks anteromedially. No pneumothorax. Left lung is clear. No left pleural effusion. Upper Abdomen: No definite free fluid in the upper abdomen. Diverticulosis at the splenic flexure of the colon. Cholecystectomy. Musculoskeletal: Bones are diffusely under mineralized. Diffuse thoracic ankylosis. Displaced fracture through T10 vertebral body involves the anterior third, extend into the T9-T10 disc space, and is displaced anteriorly. Fracture extends through anterior osteophytes as well as the anterior superior aspect of T11 vertebral body. No convincing extension to involve the posterior elements. There is adjacent paravertebral hemorrhage and small volume of gas, likely related to escaped vacuum phenomenon. No additional thoracic spine fracture. Diffuse ankylosis with flowing anterior osteophytes. No acute fracture of the ribs, included clavicles or shoulder girdles. No sternal fracture. IMPRESSION: 1. Extension injury of the thoracic spine with displaced T10 vertebral body fracture extending into the  T9-T10 disc space and anterior vertebral body. Fracture extends through anterior osteophytes as well as the anterior superior aspect of T11 vertebral body. No convincing extension to involve the posterior elements. There is adjacent paravertebral hemorrhage and small volume of gas, likely related to escaped vacuum phenomenon. Recommend spine consultation. 2. Moderate to large right pleural effusion, heterogeneous density. Areas of increased density within the pleural fluid may represent hemorrhage, collapsed lung or less likely masses. Recommend continued radiographic follow-up to ensure resolution. No pneumothorax or rib fractures. 3. Heterogeneously enlarged right lobe of the thyroid gland with scattered curvilinear calcifications. Recommend thyroid US (ref: J Am Coll Radiol. 2015 Feb;12(2): 143-50). Aortic Atherosclerosis (ICD10-I70.0). Electronically Signed   By: Keith Rake M.D.   On: 07/31/2020 22:29   CT Cervical Spine Wo Contrast  Result Date: 07/31/2020 CLINICAL DATA:  Recent fall from chair, initial encounter EXAM: CT HEAD WITHOUT CONTRAST CT CERVICAL SPINE WITHOUT CONTRAST TECHNIQUE: Multidetector CT imaging of the head and cervical spine was performed following the standard protocol without intravenous contrast. Multiplanar CT image reconstructions of the cervical spine were also generated. COMPARISON:  None. FINDINGS: CT HEAD FINDINGS Brain: No evidence of acute infarction, hemorrhage, hydrocephalus, extra-axial collection or mass lesion/mass effect. Calcification is noted in the midportion of the pons. Mild chronic white matter ischemic changes are noted. Vascular: No hyperdense vessel or unexpected calcification. Skull: Normal. Negative for fracture or focal lesion. Sinuses/Orbits: No acute finding. Other: Mild parietal scalp hematoma is noted in the vertex on the left. CT CERVICAL SPINE FINDINGS Alignment: Mild loss of normal cervical lordosis is noted likely related to muscular spasm Skull  base and vertebrae: 7 cervical segments are well visualized. Vertebral body height is well maintained. Multilevel osteophytic changes and facet hypertrophic changes are noted. No acute fracture or acute facet abnormality is seen. Soft tissues and spinal canal: Surrounding soft tissue structures show vascular calcifications. Enlargement of the right lobe of the thyroid is noted with evidence of calcifications and a nodule which measures at least 2.5 cm. No other soft tissue abnormality is noted. Upper chest: Visualized lung apices demonstrate evidence of a right-sided pleural effusion. This is of uncertain chronicity. No rib abnormality is noted. Other: None IMPRESSION: CT of the head: Left parietal scalp hematoma. No acute intracranial abnormality is noted. CT of the cervical spine: Multilevel degenerative changes without acute bony abnormality. Right-sided pleural effusion. Prominent right lobe of the thyroid with at least 1 nodule within. Recommend nonemergent thyroid US (ref: J Am Coll Radiol. 2015 Feb;12(2): 143-50). Electronically Signed   By: Inez Catalina M.D.   On: 07/31/2020 18:10   MR BRAIN WO  CONTRAST  Result Date: 08/04/2020 CLINICAL DATA:  Seizure with abnormal mental status EXAM: MRI HEAD WITHOUT CONTRAST TECHNIQUE: Multiplanar, multiecho pulse sequences of the brain and surrounding structures were obtained without intravenous contrast. COMPARISON:  CT and CTA from 2 days ago FINDINGS: Brain: No acute infarction, hemorrhage, hydrocephalus, extra-axial collection or mass lesion. Extensive low-density in the cerebral white matter attributed to chronic small vessel ischemia. There is both gliosis and scattered white matter lacunes. Cystic changes at the bilateral globus pallidus which may reflect remote insult. Preserved brain volume for age. Vascular: Preserved flow voids Skull and upper cervical spine: Normal marrow signal. 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 Artifact in the scalp where  there are skin staples by prior CT. Sinuses/Orbits: No acute finding Other: Truncated and motion degraded study. The patient was stopped early due to hypoxia. IMPRESSION: 1. No acute finding. 2. Extensive chronic small vessel disease. 3. Motion degraded and truncated study due to patient condition. Electronically Signed   By: Monte Fantasia M.D.   On: 08/04/2020 09:58   DG Chest Port 1 View  Result Date: 08/04/2020 CLINICAL DATA:  Evaluate respiratory distress EXAM: PORTABLE CHEST 1 VIEW COMPARISON:  Yesterday FINDINGS: Cardiomegaly, indistinct hila, and diffuse interstitial and hazy airspace opacity. There are likely small pleural effusions. No pneumothorax. Postoperative thoracic spine. IMPRESSION: CHF. Electronically Signed   By: Monte Fantasia M.D.   On: 08/04/2020 10:42   DG CHEST PORT 1 VIEW  Result Date: 08/03/2020 CLINICAL DATA:  Status post right thoracentesis EXAM: PORTABLE CHEST 1 VIEW COMPARISON:  08/02/2020 FINDINGS: Improvement in the right effusion following thoracentesis. There is better aeration in the right lower lung. Negative for pneumothorax. Residual bibasilar atelectasis and left lower lobe airspace opacity may represent pneumonia. Heart is enlarged. Postop changes from lower thoracic fusion evident. NG tube extends below the hemidiaphragms with the tip not visualized as it enters the stomach. Endotracheal tube 3 cm above the carina. IMPRESSION: Improvement in the right effusion following thoracentesis. Negative for pneumothorax. Otherwise stable portable chest exam. Electronically Signed   By: Jerilynn Mages.  Shick M.D.   On: 08/03/2020 13:32   DG CHEST PORT 1 VIEW  Result Date: 08/02/2020 CLINICAL DATA:  Intubation, ventilator dependence EXAM: PORTABLE CHEST 1 VIEW COMPARISON:  Portable exam 1557 hours compared to 07/31/2020 FINDINGS: Tip of endotracheal tube projects 1.9 cm above carina. Nasogastric tube extends into abdomen. Normal heart size, mediastinal contours, and pulmonary  vascularity. Diffuse pulmonary infiltrates greater on LEFT, question asymmetric edema versus pneumonia. Persistent RIGHT pleural effusion. No pneumothorax or acute osseous findings. Bones demineralized with prior thoracic spinal fixation. IMPRESSION: Increased pulmonary infiltrates question edema versus infection. Persistent RIGHT pleural effusion. Electronically Signed   By: Lavonia Dana M.D.   On: 08/02/2020 17:19   DG Abd Portable 1V  Result Date: 08/01/2020 CLINICAL DATA:  NG tube placement EXAM: PORTABLE ABDOMEN - 1 VIEW COMPARISON:  None FINDINGS: Partially visualized thoracic spinal hardware. Esophageal tube tip and side port overlie the mid stomach. Gas pattern unremarkable IMPRESSION: Esophageal tube tip and side port overlie the mid stomach. Electronically Signed   By: Donavan Foil M.D.   On: 08/01/2020 17:21   EEG adult  Result Date: 08/02/2020 Lora Havens, MD     08/02/2020  9:08 PM Patient Name: Jacqueline Richmond MRN: [id number] Epilepsy Attending: Lora Havens Referring Physician/Provider: Dr Donnetta Simpers Date: 08/02/2020 Duration: 24.40 mins Patient history: 75 y.o. female with PMH significant for CKD, HTN, Gout, HTN, DM2, HLd who is admitted  with T spine fx s/p fall who had sudden unresponsiveness with stiffening of BL upper extremities and starring off. EEG to evaluate for seizure Level of alertness:  comatose AEDs during EEG study: LEV Technical aspects: This EEG study was done with scalp electrodes positioned according to the 10-20 International system of electrode placement. Electrical activity was acquired at a sampling rate of 500Hz  and reviewed with a high frequency filter of 70Hz  and a low frequency filter of 1Hz . EEG data were recorded continuously and digitally stored. Description: EEG showed continuous generalized 3 to 6 Hz theta-delta slowing admixed with 15-18hz  fronto-central beta activity. Hyperventilation and photic stimulation were not performed.   ABNORMALITY -  Continuous slow, generalized IMPRESSION: This study is suggestive of moderate diffuse encephalopathy, nonspecific etiology but likely related to sedation. No seizures or epileptiform discharges were seen throughout the recording. Priyanka Barbra Sarks   Overnight EEG with video  Result Date: 08/03/2020 Lora Havens, MD     08/03/2020 11:20 AM Patient Name: Jacqueline Richmond MRN: [id number] Epilepsy Attending: Lora Havens Referring Physician/Provider: Dr Donnetta Simpers Duration: 08/02/2020 1809 to 08/03/2020 1004  Patient history: 75 y.o.femalewith PMH significant for CKD, HTN, Gout, HTN, DM2, HLd who is admitted with T spine fx s/p fall who had sudden unresponsiveness with stiffening of BL upper extremities and starring off. EEG to evaluate for seizure  Level of alertness:  awake, asleep  AEDs during EEG study: LEV, Propofol  Technical aspects: This EEG study was done with scalp electrodes positioned according to the 10-20 International system of electrode placement. Electrical activity was acquired at a sampling rate of 500Hz  and reviewed with a high frequency filter of 70Hz  and a low frequency filter of 1Hz . EEG data were recorded continuously and digitally stored.  Description: The posterior dominant rhythm consists of 9 Hz activity of moderate voltage (25-35 uV) seen predominantly in posterior head regions, symmetric and reactive to eye opening and eye closing. Sleep was characterized by vertex waves, sleep spindles (12 to 14 Hz), maximal frontocentral region. EEG showed continuous generalized 3 to 6 Hz theta-delta slowing admixed with 15-18hz  fronto-central beta activity. Hyperventilation and photic stimulation were not performed.    ABNORMALITY - Continuous slow, generalized  IMPRESSION: This study is suggestive of moderate diffuse encephalopathy, nonspecific etiology but likely related to sedation. No seizures or epileptiform discharges were seen throughout the recording.  Lora Havens    DG C-Arm [phone] Min  Result Date: 08/01/2020 CLINICAL DATA:  T8 through 11 fusion. EXAM: THORACOLUMBAR SPINE 1V COMPARISON:  Preoperative imaging. FINDINGS: Two fluoroscopic spot views of the thoracic spine obtained in frontal and lateral projections. Pedicle rods in the lower thoracic spine, levels difficult to accurately delineate on coned view. Total fluoroscopy time 20 seconds. Total dose 7.78 mGy. IMPRESSION: Intraoperative fluoroscopy during thoracic fusion. Electronically Signed   By: Keith Rake M.D.   On: 08/01/2020 16:16   DG C-Arm 1-60 Min-No Report  Result Date: 08/07/2020 Fluoroscopy was utilized by the requesting physician.  No radiographic interpretation.   ECHOCARDIOGRAM COMPLETE  Result Date: 08/03/2020    ECHOCARDIOGRAM REPORT   Patient Name:   Jacqueline Richmond Date of Exam: 08/03/2020 Medical Rec #:  [id number]     Height:       63.0 in Accession #:    [id number]    Weight:       186.0 lb Date of Birth:  [birth year]      BSA:  1.875 m Patient Age:    85 years      BP:           95/54 mmHg Patient Gender: F             HR:           89 bpm. Exam Location:  Inpatient Procedure: 2D Echo, Cardiac Doppler and Color Doppler Indications:    Pleural effusion  History:        Patient has no prior history of Echocardiogram examinations.                 Risk Factors:Diabetes, Hypertension and Dyslipidemia.  Sonographer:    Luisa Hart RDCS Referring Phys: [place]  1. Left ventricular ejection fraction, by estimation, is 55 to 60%. The left ventricle has normal function. The left ventricle has no regional wall motion abnormalities. Left ventricular diastolic parameters are consistent with Grade I diastolic dysfunction (impaired relaxation).  2. Right ventricular systolic function is normal. The right ventricular size is normal. There is normal pulmonary artery systolic pressure. The estimated right ventricular systolic pressure is 16.0 mmHg.  3. The mitral valve is  normal in structure. Trivial mitral valve regurgitation. No evidence of mitral stenosis.  4. The aortic valve is tricuspid. Aortic valve regurgitation is not visualized. Mild aortic valve sclerosis is present, with no evidence of aortic valve stenosis.  5. The inferior vena cava is normal in size with greater than 50% respiratory variability, suggesting right atrial pressure of 3 mmHg. FINDINGS  Left Ventricle: Left ventricular ejection fraction, by estimation, is 55 to 60%. The left ventricle has normal function. The left ventricle has no regional wall motion abnormalities. The left ventricular internal cavity size was normal in size. There is  no left ventricular hypertrophy. Left ventricular diastolic parameters are consistent with Grade I diastolic dysfunction (impaired relaxation). Right Ventricle: The right ventricular size is normal. No increase in right ventricular wall thickness. Right ventricular systolic function is normal. There is normal pulmonary artery systolic pressure. The tricuspid regurgitant velocity is 2.69 m/s, and  with an assumed right atrial pressure of 3 mmHg, the estimated right ventricular systolic pressure is 10.9 mmHg. Left Atrium: Left atrial size was normal in size. Right Atrium: Right atrial size was normal in size. Pericardium: Trivial pericardial effusion is present. Mitral Valve: The mitral valve is normal in structure. Mild mitral annular calcification. Trivial mitral valve regurgitation. No evidence of mitral valve stenosis. Tricuspid Valve: The tricuspid valve is normal in structure. Tricuspid valve regurgitation is trivial. Aortic Valve: The aortic valve is tricuspid. Aortic valve regurgitation is not visualized. Mild aortic valve sclerosis is present, with no evidence of aortic valve stenosis. Aortic valve mean gradient measures 3.0 mmHg. Aortic valve peak gradient measures 6.2 mmHg. Aortic valve area, by VTI measures 2.57 cm. Pulmonic Valve: The pulmonic valve was normal in  structure. Pulmonic valve regurgitation is trivial. Aorta: The aortic root is normal in size and structure. Venous: The inferior vena cava is normal in size with greater than 50% respiratory variability, suggesting right atrial pressure of 3 mmHg. IAS/Shunts: No atrial level shunt detected by color flow Doppler.  LEFT VENTRICLE PLAX 2D LVIDd:         5.70 cm     Diastology LVIDs:         2.90 cm     LV e' medial:    5.87 cm/s LV PW:         1.00 cm  LV E/e' medial:  20.4 LV IVS:        0.80 cm     LV e' lateral:   6.53 cm/s LVOT diam:     2.00 cm     LV E/e' lateral: 18.4 LV SV:         68 LV SV Index:   36 LVOT Area:     3.14 cm  LV Volumes (MOD) LV vol d, MOD A2C: 55.0 ml LV vol d, MOD A4C: 68.9 ml LV vol s, MOD A2C: 22.4 ml LV vol s, MOD A4C: 26.1 ml LV SV MOD A2C:     32.6 ml LV SV MOD A4C:     68.9 ml LV SV MOD BP:      40.3 ml RIGHT VENTRICLE RV S prime:     11.00 cm/s TAPSE (M-mode): 2.6 cm LEFT ATRIUM           Index       RIGHT ATRIUM           Index LA diam:      4.00 cm 2.13 cm/m  RA Area:     11.70 cm LA Vol (A4C): 41.4 ml 22.08 ml/m RA Volume:   25.60 ml  13.65 ml/m  AORTIC VALVE                   PULMONIC VALVE AV Area (Vmax):    2.30 cm    PV Vmax:       1.06 m/s AV Area (Vmean):   2.52 cm    PV Vmean:      80.000 cm/s AV Area (VTI):     2.57 cm    PV VTI:        0.210 m AV Vmax:           125.00 cm/s PV Peak grad:  4.5 mmHg AV Vmean:          84.600 cm/s PV Mean grad:  3.0 mmHg AV VTI:            0.264 m AV Peak Grad:      6.2 mmHg AV Mean Grad:      3.0 mmHg LVOT Vmax:         91.70 cm/s LVOT Vmean:        67.800 cm/s LVOT VTI:          0.216 m LVOT/AV VTI ratio: 0.82  AORTA Ao Root diam: 3.40 cm Ao Asc diam:  3.50 cm MITRAL VALVE                TRICUSPID VALVE MV Area (PHT): 2.66 cm     TR Peak grad:   28.9 mmHg MV Decel Time: 285 msec     TR Vmax:        269.00 cm/s MR Peak grad: 104.9 mmHg MR Mean grad: 70.0 mmHg     SHUNTS MR Vmax:      512.00 cm/s   Systemic VTI:  0.22 m MR Vmean:      397.0 cm/s    Systemic Diam: 2.00 cm MV E velocity: 120.00 cm/s MV A velocity: 118.00 cm/s MV E/A ratio:  1.02 Loralie Champagne MD Electronically signed by Loralie Champagne MD Signature Date/Time: 08/03/2020/3:44:06 PM    Final    US THYROID  Result Date: 08/02/2020 CLINICAL DATA:  Thyroid enlargement EXAM: THYROID ULTRASOUND TECHNIQUE: Ultrasound examination of the thyroid gland and adjacent soft tissues was performed. COMPARISON:  None. FINDINGS: Parenchymal Echotexture: Mildly heterogeneous Isthmus:  0.4 cm Right lobe: 6.7 x 4.0 x 4.3 cm Left lobe: 4.0 x 2.0 x 1.1 cm _________________________________________________________ Estimated total number of nodules >/= 1 cm: 3 Number of spongiform nodules >/=  2 cm not described below (TR1): 0 Number of mixed cystic and solid nodules >/= 1.5 cm not described below ([place]): 0 _________________________________________________________ Nodule # 1: Location: Right; mid Maximum size: 3.8 cm; Other 2 dimensions: 3.5 x 3.4 cm Composition: solid/almost completely solid (2) Echogenicity: isoechoic (1) Shape: not taller-than-wide (0) Margins: smooth (0) Echogenic foci: none (0) ACR TI-RADS total points: 3. ACR TI-RADS risk category: TR3 (3 points). ACR TI-RADS recommendations: **Given size (>/= 2.5 cm) and appearance, fine needle aspiration of this mildly suspicious nodule should be considered based on TI-RADS criteria. _________________________________________________________ Nodule # 2: Location: Right; mid Maximum size: 2.1 cm; Other 2 dimensions: 1.5 x 1.7 cm Composition: solid/almost completely solid (2) Echogenicity: isoechoic (1) Shape: not taller-than-wide (0) Margins: smooth (0) Echogenic foci: none (0) ACR TI-RADS total points: 3. ACR TI-RADS risk category: TR3 (3 points). ACR TI-RADS recommendations: *Given size (>/= 1.5 - 2.4 cm) and appearance, a follow-up ultrasound in 1 year should be considered based on TI-RADS criteria.  _________________________________________________________ Nodule # 3: Location: Right; inferior Maximum size: 2.7 cm; Other 2 dimensions: 2.5 x 2.5 cm Composition: solid/almost completely solid (2) Echogenicity: hypoechoic (2) Shape: not taller-than-wide (0) Margins: smooth (0) Echogenic foci: none (0) ACR TI-RADS total points: 4. ACR TI-RADS risk category: TR4 (4-6 points). ACR TI-RADS recommendations: **Given size (>/= 1.5 cm) and appearance, fine needle aspiration of this moderately suspicious nodule should be considered based on TI-RADS criteria. _________________________________________________________ IMPRESSION: 1. Nodule 1 (TI-RADS 3) and nodule 3 (TI-RADS 4) meets criteria for FNA. 2. Nodule 2 (TI-RADS 3) meets criteria for surveillance. Follow-up thyroid ultrasound should be performed in 1 year. The above is in keeping with the ACR TI-RADS recommendations - J Am Coll Radiol 2017;14:587-595. Electronically Signed   By: Miachel Roux M.D.   On: 08/02/2020 07:17   CT HEAD CODE STROKE WO CONTRAST  Result Date: 08/02/2020 CLINICAL DATA:  Code stroke. Unresponsive. Code stroke presentation. EXAM: CT HEAD WITHOUT CONTRAST TECHNIQUE: Contiguous axial images were obtained from the base of the skull through the vertex without intravenous contrast. 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 Cerebral hemispheres show extensive chronic small-vessel ischemic changes throughout the deep and subcortical white matter, basal ganglia and thalami. I do not identify an acute infarction, but acute infarction could be hidden within the extensive chronic changes. No mass, hemorrhage, hydrocephalus or extra-axial collection. Vascular: There is atherosclerotic calcification of the major vessels at the base of the brain. Skull: No skull fracture. Sinuses/Orbits: Clear presently. Chronic mucoperiosteal thickening of the left maxillary sinus.  Orbits negative. Other: Left posterior parietal scalp staples. Adjacent sebaceous cyst of the scalp. ASPECTS [facility] Stroke Program Early CT Score) - Ganglionic level infarction (caudate, lentiform nuclei, internal capsule, insula, M1-M3 cortex): 7 - Supraganglionic infarction (M4-M6 cortex): 3 Total score (0-10 with 10 being normal): 10 IMPRESSION: 1. No acute finding by CT. 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 Chronic calcification of the right pons. 2. ASPECTS is 10 3. Discussed by telephone with Dr. Lorrin Goodell at 1412 hours. Electronically Signed   By: Nelson Chimes M.D.   On: 08/02/2020 14:14   CT ANGIO HEAD CODE STROKE  Result Date: 08/02/2020 CLINICAL DATA:  Unresponsive EXAM: CT ANGIOGRAPHY HEAD AND NECK TECHNIQUE: Multidetector  CT imaging of the head and neck was performed using the standard protocol during bolus administration of intravenous contrast. Multiplanar CT image reconstructions and MIPs were obtained to evaluate the vascular anatomy. Carotid stenosis measurements (when applicable) are obtained utilizing NASCET criteria, using the distal internal carotid diameter as the denominator. CONTRAST:  24mL OMNIPAQUE IOHEXOL 350 MG/ML SOLN COMPARISON:  None. FINDINGS: CTA NECK Aortic arch: Calcified plaque along the arch and patent great vessel origins. Right carotid system: Patent. Common carotid is mildly displaced by enlarged thyroid. Calcified plaque at the bifurcation and proximal internal carotid with minimal stenosis. Left carotid system: Patent. Calcified plaque at the common carotid bifurcation and proximal internal carotid causing minimal stenosis. Vertebral arteries: Patent and codominant.  No stenosis. Skeleton: Advanced cervical spine degenerative changes. Degenerative changes of temporomandibular joints. Other neck: Enlarged, heterogeneous thyroid is evaluated on recent ultrasound. Endotracheal and enteric tubes are present. Upper chest: Right much greater  than left pleural effusions and left lung consolidation and ground-glass density. Review of the MIP images confirms the above findings CTA HEAD Anterior circulation: The intracranial internal carotid arteries are patent with calcified plaque causing mild stenosis. Anterior and middle cerebral arteries are patent. Posterior circulation: Intracranial vertebral arteries are patent with calcified plaque causing up to moderate stenosis. Basilar artery is patent. Major cerebellar artery origins are patent. Bilateral posterior communicating arteries are present. Posterior cerebral arteries are patent. Venous sinuses: Patent as allowed by contrast bolus timing. Review of the MIP images confirms the above findings IMPRESSION: No large vessel occlusion, hemodynamically significant stenosis, or evidence of dissection. Partially imaged right much greater than left pleural effusions. Density of pleural fluid is greater than simple fluid. Left lung consolidation and ground-glass density is new from 07/31/2020. Electronically Signed   By: Macy Mis M.D.   On: 08/02/2020 14:43   CT ANGIO NECK CODE STROKE  Result Date: 08/02/2020 CLINICAL DATA:  Unresponsive EXAM: CT ANGIOGRAPHY HEAD AND NECK TECHNIQUE: Multidetector CT imaging of the head and neck was performed using the standard protocol during bolus administration of intravenous contrast. Multiplanar CT image reconstructions and MIPs were obtained to evaluate the vascular anatomy. Carotid stenosis measurements (when applicable) are obtained utilizing NASCET criteria, using the distal internal carotid diameter as the denominator. CONTRAST:  70mL OMNIPAQUE IOHEXOL 350 MG/ML SOLN COMPARISON:  None. FINDINGS: CTA NECK Aortic arch: Calcified plaque along the arch and patent great vessel origins. Right carotid system: Patent. Common carotid is mildly displaced by enlarged thyroid. Calcified plaque at the bifurcation and proximal internal carotid with minimal stenosis. Left  carotid system: Patent. Calcified plaque at the common carotid bifurcation and proximal internal carotid causing minimal stenosis. Vertebral arteries: Patent and codominant.  No stenosis. Skeleton: Advanced cervical spine degenerative changes. Degenerative changes of temporomandibular joints. Other neck: Enlarged, heterogeneous thyroid is evaluated on recent ultrasound. Endotracheal and enteric tubes are present. Upper chest: Right much greater than left pleural effusions and left lung consolidation and ground-glass density. Review of the MIP images confirms the above findings CTA HEAD Anterior circulation: The intracranial internal carotid arteries are patent with calcified plaque causing mild stenosis. Anterior and middle cerebral arteries are patent. Posterior circulation: Intracranial vertebral arteries are patent with calcified plaque causing up to moderate stenosis. Basilar artery is patent. Major cerebellar artery origins are patent. Bilateral posterior communicating arteries are present. Posterior cerebral arteries are patent. Venous sinuses: Patent as allowed by contrast bolus timing. Review of the MIP images confirms the above findings IMPRESSION: No large vessel occlusion, hemodynamically significant  stenosis, or evidence of dissection. Partially imaged right much greater than left pleural effusions. Density of pleural fluid is greater than simple fluid. Left lung consolidation and ground-glass density is new from 07/31/2020. Electronically Signed   By: Macy Mis M.D.   On: 08/02/2020 14:43    Microbiology: Recent Results (from the past 240 hour(s))  Resp Panel by RT-PCR (Flu A&B, Covid) Nasopharyngeal Swab     Status: None   Collection Time: 07/31/20 11:08 PM   Specimen: Nasopharyngeal Swab; Nasopharyngeal(NP) swabs in vial transport medium  Result Value Ref Range Status   SARS Coronavirus 2 by RT PCR NEGATIVE NEGATIVE Final    Comment: (NOTE) SARS-CoV-2 target nucleic acids are NOT  DETECTED.  The SARS-CoV-2 RNA is generally detectable in upper respiratory specimens during the acute phase of infection. The lowest concentration of SARS-CoV-2 viral copies this assay can detect is 138 copies/mL. A negative result does not preclude SARS-Cov-2 infection and should not be used as the sole basis for treatment or other patient management decisions. A negative result may occur with  improper specimen collection/handling, submission of specimen other than nasopharyngeal swab, presence of viral mutation(s) within the areas targeted by this assay, and inadequate number of viral copies(<138 copies/mL). A negative result must be combined with clinical observations, patient history, and epidemiological information. The expected result is Negative.  Fact Sheet for Patients:  EntrepreneurPulse.com.au  Fact Sheet for Healthcare Providers:  IncredibleEmployment.be  This test is no t yet approved or cleared by the [place] FDA and  has been authorized for detection and/or diagnosis of SARS-CoV-2 by FDA under an Emergency Use Authorization (EUA). This EUA will remain  in effect (meaning this test can be used) for the duration of the COVID-19 declaration under Section 564(b)(1) of the Act, 21 U.S.C.section 360bbb-3(b)(1), unless the authorization is terminated  or revoked sooner.       Influenza A by PCR NEGATIVE NEGATIVE Final   Influenza B by PCR NEGATIVE NEGATIVE Final    Comment: (NOTE) The Xpert Xpress SARS-CoV-2/FLU/RSV plus assay is intended as an aid in the diagnosis of influenza from Nasopharyngeal swab specimens and should not be used as a sole basis for treatment. Nasal washings and aspirates are unacceptable for Xpert Xpress SARS-CoV-2/FLU/RSV testing.  Fact Sheet for Patients: EntrepreneurPulse.com.au  Fact Sheet for Healthcare Providers: IncredibleEmployment.be  This test is not yet  approved or cleared by the [place] FDA and has been authorized for detection and/or diagnosis of SARS-CoV-2 by FDA under an Emergency Use Authorization (EUA). This EUA will remain in effect (meaning this test can be used) for the duration of the COVID-19 declaration under Section 564(b)(1) of the Act, 21 U.S.C. section 360bbb-3(b)(1), unless the authorization is terminated or revoked.  Performed at [facility], [address] [place], [place] [address]   Culture, blood (Routine X 2) w Reflex to ID Panel     Status: None   Collection Time: 08/01/20 12:05 AM   Specimen: BLOOD  Result Value Ref Range Status   Specimen Description   Final    BLOOD RIGHT ANTECUBITAL Performed at [facility], [address] [place], [place] [address]    Special Requests   Final    NONE Performed at [facility], [address] [place], [place] [address]    Culture   Final    NO GROWTH 5 DAYS Performed at [facility], [place] [address], [place], [place] [address]    Report Status 08/06/2020 FINAL  Final  Culture, blood (Routine X 2) w Reflex to ID Panel     Status: None   Collection Time: 08/01/20 12:10 AM   Specimen: BLOOD  Result Value Ref Range Status   Specimen Description   Final    BLOOD LEFT ANTECUBITAL Performed at [facility], [address] [place], [place] [address]    Special Requests   Final    NONE Performed at [facility], [address] [place], [place] [address]    Culture   Final    NO GROWTH 5 DAYS Performed at [facility], [place] [address] [place], [place] [address]    Report Status 08/06/2020 FINAL  Final  MRSA PCR Screening     Status: None   Collection Time: 08/01/20  2:59 AM   Specimen: Nasal Mucosa; Nasopharyngeal  Result Value Ref Range Status   MRSA by PCR NEGATIVE NEGATIVE Final    Comment:        The GeneXpert MRSA Assay (FDA approved  for NASAL specimens only), is one component of a comprehensive MRSA colonization surveillance program. It is not intended to diagnose MRSA infection nor to guide or monitor treatment for MRSA infections. Performed at [facility], [place] [address], [place], [place] [address]   Body fluid culture w Gram Stain     Status: None   Collection Time: 08/03/20 11:30 AM   Specimen: Fluid  Result Value Ref Range Status   Specimen Description FLUID RIGHT PLEURAL  Final   Special Requests NONE  Final   Gram Stain   Final    ABUNDANT WBC PRESENT, PREDOMINANTLY PMN NO ORGANISMS SEEN    Culture   Final    NO GROWTH 3 DAYS Performed at [facility], [place] [address], [place], [place] [address]    Report Status 08/06/2020 FINAL  Final  SARS CORONAVIRUS 2 (TAT 6-24 HRS) Nasopharyngeal Nasopharyngeal Swab     Status: None   Collection Time: 08/07/20  5:04 PM   Specimen: Nasopharyngeal Swab  Result Value Ref Range Status   SARS Coronavirus 2 NEGATIVE NEGATIVE Final    Comment: (NOTE) SARS-CoV-2 target nucleic acids are NOT DETECTED.  The SARS-CoV-2 RNA is generally detectable in upper and lower respiratory specimens during the acute phase of infection. Negative results do not preclude SARS-CoV-2 infection, do not rule out co-infections with other pathogens, and should not be used as the sole basis for treatment or other patient management decisions. Negative results must be combined with clinical observations, patient history, and epidemiological information. The expected result is Negative.  Fact Sheet for Patients: SugarRoll.be  Fact Sheet for Healthcare Providers: https://www.woods-mathews.com/  This test is not yet approved or cleared by the [place] FDA and  has been authorized for detection and/or diagnosis of SARS-CoV-2 by FDA under an Emergency Use Authorization (EUA). This EUA will remain  in effect (meaning this test can be  used) for the duration of the COVID-19 declaration under Se ction 564(b)(1) of the Act, 21 U.S.C. section 360bbb-3(b)(1), unless the authorization is terminated or revoked sooner.  Performed at [facility], [place] [address] [place], [place] [address]      Labs: CBC: Recent Labs  Lab 08/03/20 0804 08/05/20 0141 08/06/20 0652 08/07/20 0208 08/08/20 0056  WBC 15.9* 13.1* 9.8 10.5 12.4*  NEUTROABS  --  9.6* 7.2 7.2 9.3*  HGB 8.8* 8.7* 8.8* 8.5* 8.5*  HCT 27.9* 27.9* 27.3* 26.6* 26.9*  MCV 87.5 88.9 87.8  87.2 87.9  PLT 151 176 204 204 544   Basic Metabolic Panel: Recent Labs  Lab 08/03/20 0804 08/04/20 0425 08/05/20 0141 08/06/20 0652 08/07/20 0208 08/08/20 0056  NA 139 141 141 141 139 137  K 4.2 4.2 4.4 3.8 3.7 3.5  CL 111 115* 112* 109 107 106  CO2 20* 21* 24 24 25 26   GLUCOSE 76 101* 102* 100* 96 113*  BUN 72* 60* 58* 62* 68* 62*  CREATININE 2.04* 1.59* 1.57* 1.68* 1.76* 1.53*  CALCIUM 7.7* 8.0* 8.1* 8.3* 8.3* 8.5*  MG 2.0  --   --   --   --   --    Liver Function Tests: Recent Labs  Lab 08/03/20 1159  ALBUMIN 2.4*   CBG: Recent Labs  Lab 08/07/20 2032 08/07/20 2336 08/08/20 0404 08/08/20 0741 08/08/20 1145  GLUCAP 127* 107* 129* 105* 179*    Time spent: 35 minutes  Signed:  Berle Mull  [facility]  08/08/2020 12:22 PM

## 2020-08-09 ENCOUNTER — Non-Acute Institutional Stay (SKILLED_NURSING_FACILITY): Payer: Medicare Other | Admitting: Internal Medicine

## 2020-08-09 ENCOUNTER — Encounter: Payer: Self-pay | Admitting: Internal Medicine

## 2020-08-09 DIAGNOSIS — D62 Acute posthemorrhagic anemia: Secondary | ICD-10-CM | POA: Diagnosis not present

## 2020-08-09 DIAGNOSIS — E042 Nontoxic multinodular goiter: Secondary | ICD-10-CM

## 2020-08-09 DIAGNOSIS — E1365 Other specified diabetes mellitus with hyperglycemia: Secondary | ICD-10-CM

## 2020-08-09 DIAGNOSIS — IMO0002 Reserved for concepts with insufficient information to code with codable children: Secondary | ICD-10-CM

## 2020-08-09 DIAGNOSIS — J942 Hemothorax: Secondary | ICD-10-CM | POA: Diagnosis not present

## 2020-08-09 DIAGNOSIS — E1322 Other specified diabetes mellitus with diabetic chronic kidney disease: Secondary | ICD-10-CM

## 2020-08-09 DIAGNOSIS — S22079G Unspecified fracture of T9-T10 vertebra, subsequent encounter for fracture with delayed healing: Secondary | ICD-10-CM

## 2020-08-09 DIAGNOSIS — N179 Acute kidney failure, unspecified: Secondary | ICD-10-CM

## 2020-08-09 DIAGNOSIS — N183 Chronic kidney disease, stage 3 unspecified: Secondary | ICD-10-CM

## 2020-08-09 DIAGNOSIS — N1831 Chronic kidney disease, stage 3a: Secondary | ICD-10-CM

## 2020-08-09 NOTE — Patient Instructions (Signed)
See assessment and plan under each diagnosis in the problem list and acutely for this visit 

## 2020-08-09 NOTE — Assessment & Plan Note (Addendum)
Isometric exercises discussed to be performed 4- 5 times prior to standing if she had been seated or lying for a period of time.

## 2020-08-09 NOTE — Assessment & Plan Note (Signed)
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

## 2020-08-09 NOTE — Assessment & Plan Note (Addendum)
A1c was prediabetic at 5.4%.   During hospitalization glucoses ranged from 76 up to 194.  Both of those values were outliers.  Oral sulfonylurea therapy will be monitored closely due to its being on Beers' List due to hypoglycemia risk

## 2020-08-09 NOTE — Assessment & Plan Note (Signed)
Iron supplementation ordered at [facility].

## 2020-08-09 NOTE — Progress Notes (Signed)
NURSING HOME LOCATION:  [facility] ROOM NUMBER:  [id number]  CODE STATUS:  Full Code  PCP:  Bernerd Limbo MD  This is a comprehensive admission note to this SNFperformed on this date less than 30 days from date of admission. Included are preadmission medical/surgical history; reconciled medication list; family history; social history and comprehensive review of systems.  Corrections and additions to the records were documented. Comprehensive physical exam was also performed. Additionally a clinical summary was entered for each active diagnosis pertinent to this admission in the Problem List to enhance continuity of care.  HPI: She was hospitalized 5/10 - 08/08/2020 after falling from her porch 2 steps and striking her head.  In the ED CT of the head was nonacute. CT of the chest without contrast revealed extension injury of the thoracic spine with displaced T10 vertebral body fracture extending into the T9-T10 disc space and anterior vertebral body.  The fracture extended through the anterior osteophytes as well as the anterior superior segment of T11 vertebral body.  There was evidence of adjacent paravertebral hemorrhage and a small volume of gas, likely related to escape vacuum phenomenon.  An incidental finding was a large right pleural effusion suggesting hemorrhage.  Trauma surgeon Dr. Windle Guard consulted ; thoracentesis was to be performed 5/12 but prior to the procedure in IR the patient had acute mental status changes with acute hypoxic respiratory failure for which rapid response team was called.  Patient was unable to protect her airway and required intubation.  Code stroke was called.  The event was described as sudden unresponsiveness with stiffening of upper extremities with a distant stare.  CT of the head revealed no intracranial hemorrhage or stroke.  CTA revealed no LVO.  She received 2 g IV of Keppra as per Neurology.  Subsequently stroke was ruled out.  EEG and  continuous EEG were negative for any epileptiform activity; findings were suggestive of moderate diffuse encephalopathy of nonspecific etiology possibly related to sedation.  Antiseizure medications were subsequently discontinued. Once stabilized thoracentesis was performed with removal of 1800 cc of bloody fluid.  Exudative nature of the fluid was documented; abundant WBCs were present predominantly PMNs but no organisms were documented.  Procalcitonin was elevated over 16 but the leukocytosis did resolve and bacterial pneumonia was not felt to be present. Extubation was completed 5/13 and she was transferred from the PCCM service to [facility] on 5/14. That same day the patient again went into respiratory distress requiring nonrebreather.  Etiology was acute pulmonary edema/acute on chronic diastolic congestive heart failure.  Diuresis was initiated with improvement. Creatinine peaked at 2.68 but subsequently dropped to 1.68; baseline was felt to be 1.4. Neurosurgery completed T10-11 stabilization for the unstable T10 fracture on 5/11 via posterior lateral arthrodesis level 4.  TLSO brace was to be worn when upright and ambulating.  Neurosurgical staples were to be removed 5/25. Incidental finding on CT was enlargement of the right lobe of the thyroid gland with scattered curvilinear calcifications.Thyroid ultrasound 5/12 revealed 3 nodules, 2 of which met FNA criteria. PT/OT recommended CIR but patient preferred to go to [facility] where her daughter works as a Astronomer.  Past medical and surgical history: Includes essential hypertension, dyslipidemia, diverticulosis, history of colon polyps, and CKD. Surgeries and procedures include breast lumpectomy, cholecystectomy, colonoscopies, and TKA.  Social history: Occasional alcohol intake; non-smoker.  Family history: Reviewed   Review of systems: She denies any neurologic or cardiologic prodrome prior to the fall.  She states that she had been in  the rocking chair on her porch and got up to walk back to the front door.  She felt unbalanced , grabbed the other rocking chair and then fell backwards down the stairs. She denies any episodes of hypoglycemia.  She states that glucoses are checked occasionally at home and range from 150 up to 175.  She denied any diabetic related symptoms.   She denies any bleeding dyscrasias.  Constitutional: No fever, significant weight change  Eyes: No redness, discharge, pain, vision change ENT/mouth: No nasal congestion, purulent discharge, earache, change in hearing, sore throat  Cardiovascular: No chest pain, palpitations, paroxysmal nocturnal dyspnea, claudication, edema  Respiratory: No cough, sputum production, hemoptysis, DOE, significant snoring, apnea Gastrointestinal: No heartburn, dysphagia, abdominal pain, nausea /vomiting, rectal bleeding, melena, change in bowels Genitourinary: No dysuria, hematuria, pyuria, incontinence, nocturia Musculoskeletal: No joint stiffness, joint swelling, weakness, pain Dermatologic: No rash, pruritus, change in appearance of skin Neurologic: No dizziness, headache, syncope, seizures, numbness, tingling Psychiatric: No significant anxiety, depression, insomnia, anorexia Endocrine: No change in hair/skin/nails, excessive thirst, excessive hunger, excessive urination  Hematologic/lymphatic: No significant bruising, lymphadenopathy, abnormal bleeding Allergy/immunology: No itchy/watery eyes, significant sneezing, urticaria, angioedema  Physical exam:  Pertinent or positive findings: She was sitting in the wheelchair wearing the thoracic brace.  Dental hygiene is good.  Breath sounds are decreased.  Heart rate is slow.  Pedal pulses are decreased.  Strength to opposition is stronger in the upper extremities than in the lower extremities.  The skin @ the left ankle area reveals dry, keratotic hyperpigmented changes.  There are some stasis changes bilaterally but greater on  the left.  General appearance: Adequately nourished; no acute distress, increased work of breathing is present.   Lymphatic: No lymphadenopathy about the head, neck, axilla. Eyes: No conjunctival inflammation or lid edema is present. There is no scleral icterus. Ears:  External ear exam shows no significant lesions or deformities.   Nose:  External nasal examination shows no deformity or inflammation. Nasal mucosa are pink and moist without lesions, exudates Oral exam: Lips and gums are healthy appearing.There is no oropharyngeal erythema or exudate. Neck:  No thyromegaly, masses, tenderness noted.    Heart:  No gallop, murmur, click, rub.  Lungs:  without wheezes, rhonchi, rales, rubs. Abdomen: Bowel sounds are normal.  Abdomen is soft and nontender with no organomegaly, hernias, masses. GU: Deferred  Extremities:  No cyanosis, clubbing, edema. Neurologic exam: Balance, Rhomberg, finger to nose testing could not be completed due to clinical state Skin: Warm & dry w/o tenting.  See clinical summary under each active problem in the Problem List with associated updated therapeutic plan

## 2020-08-10 DIAGNOSIS — E042 Nontoxic multinodular goiter: Secondary | ICD-10-CM | POA: Insufficient documentation

## 2020-08-10 NOTE — Assessment & Plan Note (Signed)
Clinically no respiratory compromise @ present

## 2020-08-10 NOTE — Assessment & Plan Note (Signed)
PCP can arrange FNA post discharge from SNF

## 2020-08-13 MED FILL — Medication: Qty: 1 | Status: AC

## 2020-08-17 ENCOUNTER — Non-Acute Institutional Stay (SKILLED_NURSING_FACILITY): Payer: Medicare PPO | Admitting: Adult Health

## 2020-08-17 ENCOUNTER — Encounter: Payer: Self-pay | Admitting: Adult Health

## 2020-08-17 DIAGNOSIS — D62 Acute posthemorrhagic anemia: Secondary | ICD-10-CM

## 2020-08-17 DIAGNOSIS — E1122 Type 2 diabetes mellitus with diabetic chronic kidney disease: Secondary | ICD-10-CM | POA: Diagnosis not present

## 2020-08-17 DIAGNOSIS — I5032 Chronic diastolic (congestive) heart failure: Secondary | ICD-10-CM

## 2020-08-17 DIAGNOSIS — S22079G Unspecified fracture of T9-T10 vertebra, subsequent encounter for fracture with delayed healing: Secondary | ICD-10-CM

## 2020-08-17 DIAGNOSIS — N1832 Chronic kidney disease, stage 3b: Secondary | ICD-10-CM

## 2020-08-17 NOTE — Progress Notes (Signed)
Location:  [facility] Room Number: 116-A Place of Service:  SNF (31) Provider:  Durenda Age, DNP, FNP-BC  Patient Care Team: Bernerd Limbo, MD as PCP - General (Family Medicine)  Extended Emergency Contact Information Primary Emergency Contact: Andujo,Ronald Address: [address]          [place] Phone: [phone] Mobile Phone: [phone] Relation: Spouse Secondary Emergency Contact: Enrique Sack Mobile Phone: [phone] Relation: Son Interpreter needed? No  Code Status:   Full Code  Goals of care: Advanced Directive information Advanced Directives 08/24/2020  Does Patient Have a Medical Advance Directive? No  Would patient like information on creating a medical advance directive? No - Patient declined  Pre-existing out of facility DNR order (yellow form or pink MOST form) -     Chief Complaint  Patient presents with  . Acute Visit    Short term rehab.    HPI:  Pt is a 75 y.o. female seen today for medical management of chronic diseases. She is a short-term rehab patient of [facility]. CBGs ranging from 73 to 185. She takes Glipizide 10 mg daily and Januvia 50 mg daily for Diabetes mellitus. No SOB noted. She takes 40 mg daily for  CHF.   She was admitted to [facility] on 08/08/20 post hospital admission 07/31/20 to 08/08/20 S/P fall at home sustaining unstable T10 vertebral fracture. She was sitting in a rocking chair in her porch for 1 to 2 hours then she got up and loss balance and fell on the steps hitting her head. She did not loss consciousness. CT head nonacute. CT chest without contrast showed extension injury of the thoracic spine with displaced T10 vertebral body fracture extending into the T9-T10 disc space and anterior vertebral body.  Fracture extends through anterior osteophytes as well as the anterior superior aspect of T11  vertebral body.  There is adjacent paravertebral hemorrhage and small volume of gas likely related to escape vacuum phenomenon.  Neurosurgery was consulted.  CT chest without contrast showed moderate to large right pleural effusion, heterogeneous density, may represent hemorrhage, collapsed lung or less likely masses.  Trauma surgery, Dr. Windle Guard, was consulted.  Heterogeneously enlarged right lobe of the thyroid gland with scattered curvilinear calcifications.  Thyroid ultrasound was recommended.  She underwent T8-11 stabilization for unstable T10 fracture on 08/01/2020. Thoracentesis was done on 08/02/2020 due to moderate to large right hemothorax.  However, before the procedure was a started in the IR, patient had acute change of mental status and acute hypoxic respiratory failure for which rapid response was called.  He was intubated and code stroke was called.  She ruled out of stroke.  EEG and continuous EEG were negative for any epileptiform activity.  She underwent right-sided thoracentesis with removal of 1800 cc of bloody fluid.  She was subsequently extubated on 08/03/2020.  On 08/04/2020, patient went into respiratory distress requiring nonrebreather.  This was thought to be due to acute pulmonary edema/acute on chronic diastolic congestive heart failure.  She was diuresed.  She then eventually improved and discharged to [facility].   Past Medical History:  Diagnosis Date  . Allergy   . Anemia   . Arthritis   . Cataract    biltaerally   . CKD (chronic kidney disease) stage 3, GFR 30-59 ml/min (HCC) 08/29/2011   per patient, kidneys monitored by Dr Posey Pronto endocrinologist ,lov was  05-18-2018, "kidney fx stable"  .  Colon polyp   . Complication of anesthesia   . Difficult intubation    small mouth opening  . Diverticulosis   . Gout   . HTN (hypertension) 08/29/2011  . Hyperlipemia   . Psoriasis   . Type II or unspecified type diabetes mellitus without mention of  complication, not stated as uncontrolled 08/29/2011   Past Surgical History:  Procedure Laterality Date  . BREAST LUMPECTOMY WITH NEEDLE LOCALIZATION Right 05/07/2012   Procedure: BREAST LUMPECTOMY WITH NEEDLE LOCALIZATION;  Surgeon: Merrie Roof, MD;  Location: [facility];  Service: General;  Laterality: Right;  . CHOLECYSTECTOMY    . COLONOSCOPY  2009   Ardis Hughs   . COLONOSCOPY WITH PROPOFOL N/A 05/20/2018   Procedure: COLONOSCOPY WITH PROPOFOL;  Surgeon: Milus Banister, MD;  Location: [facility];  Service: Endoscopy;  Laterality: N/A;  . HERNIA REPAIR    . HIP SURGERY    . LAMINECTOMY WITH POSTERIOR LATERAL ARTHRODESIS LEVEL 4 N/A 08/01/2020   Procedure: LAMINECTOMY WITH POSTERIOR LATERAL ARTHRODESIS Thoracic nine- Thoracic eleven ;  Surgeon: Consuella Lose, MD;  Location: [facility];  Service: Neurosurgery;  Laterality: N/A;  . POLYPECTOMY  05/20/2018   Procedure: POLYPECTOMY;  Surgeon: Milus Banister, MD;  Location: [facility];  Service: Endoscopy;;  . RHINOPLASTY    . SALPINGOOPHORECTOMY    . TOTAL HIP ARTHROPLASTY Right 2004    Allergies  Allergen Reactions  . Codeine Itching  . Iodinated Diagnostic Agents     Other reaction(s): Unknown  . Pentazocine Other (See Comments)    Hallucinations  . Statins     Outpatient Encounter Medications as of 08/17/2020  Medication Sig  . allopurinol (ZYLOPRIM) 300 MG tablet Take 150 mg by mouth daily. 1/2 Tablet  . atenolol (TENORMIN) 100 MG tablet Take 100 mg by mouth daily.  Marland Kitchen b complex vitamins tablet Take 1 tablet by mouth daily.  . bisacodyl (DULCOLAX) 10 MG suppository Place 10 mg rectally as needed for moderate constipation.  . cloNIDine (CATAPRES) 0.2 MG tablet Take 1 tablet (0.2 mg total) by mouth 2 (two) times daily.  . ferrous sulfate 325 (65 FE) MG tablet Take 325 mg by mouth daily with breakfast.  . furosemide (LASIX) 40 MG tablet Take 40 mg by mouth daily.   Marland Kitchen loratadine (CLARITIN) 10 MG tablet Take 10 mg by mouth daily.  .  Magnesium Hydroxide (MILK OF MAGNESIA PO) Take 30 mLs by mouth as needed.  . mometasone (ELOCON) 0.1 % cream Apply 1 application topically daily as needed (psoriasis).  . Omega-3 1000 MG CAPS Take 1,000 mg by mouth 2 (two) times daily.  . ondansetron (ZOFRAN) 4 MG tablet Take 4 mg by mouth every 8 (eight) hours as needed for nausea or vomiting.  . polyethylene glycol (MIRALAX / GLYCOLAX) 17 g packet Take 17 g by mouth daily.  . sitaGLIPtin (JANUVIA) 50 MG tablet Take 50 mg by mouth daily.   . Sodium Phosphates (RA SALINE ENEMA RE) Place rectally as needed.  . [DISCONTINUED] benzonatate (TESSALON) 100 MG capsule Take 1 capsule (100 mg total) by mouth 3 (three) times daily.  . [DISCONTINUED] glipiZIDE (GLUCOTROL XL) 10 MG 24 hr tablet Take 10 mg by mouth daily with breakfast.  . [DISCONTINUED] HYDROcodone-acetaminophen (NORCO/VICODIN) 5-325 MG tablet Take 2 tablets by mouth every 6 (six) hours as needed for moderate pain or severe pain.  . [DISCONTINUED] allopurinol (ZYLOPRIM) 300 MG tablet Take 1 tablet (300 mg total) by mouth daily.  . [DISCONTINUED] niacin 500 MG  tablet Take 500 mg by mouth 2 (two) times daily with a meal. Reported on 06/19/2015  . [DISCONTINUED] phenol (CHLORASEPTIC) 1.4 % LIQD Use as directed 1 spray in the mouth or throat as needed for throat irritation / pain.   No facility-administered encounter medications on file as of 08/17/2020.    Review of Systems  GENERAL: No change in appetite, no fatigue, no weight changes, no fever or. chills  MOUTH and THROAT: Denies oral discomfort, gingival pain or bleeding RESPIRATORY: no cough, SOB, DOE, wheezing, hemoptysis CARDIAC: No chest pain or palpitations GI: No abdominal pain, diarrhea, constipation, heart burn, nausea or vomiting GU: Denies dysuria, frequency, hematuria, incontinence, or discharge NEUROLOGICAL: Denies dizziness, syncope, numbness, or headache PSYCHIATRIC: Denies feelings of depression or anxiety. No report of  hallucinations, insomnia, paranoia, or agitation   Immunization History  Administered Date(s) Administered  . Fluad Quad(high Dose 65+) 12/03/2018  . Influenza Split 12/22/2008, 11/30/2009, 12/13/2010, 12/25/2011  . Influenza, High Dose Seasonal PF 01/11/2015, 11/23/2017, 12/03/2018, 12/13/2019  . Influenza, Seasonal, Injecte, Preservative Fre 12/16/2013  . Influenza,inj,Quad PF,6+ Mos 12/11/2015, 11/14/2016  . Influenza,inj,Quad PF,6-35 Mos 12/11/2015, 11/14/2016  . Influenza,inj,quad, With Preservative 12/16/2013  . PFIZER(Purple Top)SARS-COV-2 Vaccination 05/01/2019, 05/26/2019, 11/22/2019, 07/17/2020  . Pneumococcal Conjugate-13 01/16/2012  . Pneumococcal Polysaccharide-23 03/24/2005, 08/08/2014  . Tdap 09/13/2010   Pertinent  Health Maintenance Due  Topic Date Due  . FOOT EXAM  Never done  . OPHTHALMOLOGY EXAM  Never done  . MAMMOGRAM  Never done  . DEXA SCAN  Never done  . INFLUENZA VACCINE  10/22/2020  . HEMOGLOBIN A1C  02/01/2021  . COLONOSCOPY (Pts 45-25yrs Insurance coverage will need to be confirmed)  05/20/2028  . PNA vac Low Risk Adult  Completed   No flowsheet data found.   Vitals:   08/17/20 1143  BP: 135/62  Pulse: 74  Resp: 17  Temp: (!) 96.7 F (35.9 C)  Weight: 167 lb (75.8 kg)  Height: 5\' 3"  (1.6 m)   Body mass index is 29.58 kg/m.  Physical Exam  GENERAL APPEARANCE: Well nourished. In no acute distress. Obese SKIN:  Right heel with discoloration MOUTH and THROAT: Lips are without lesions. Oral mucosa is moist and without lesions.  RESPIRATORY: Breathing is even & unlabored, BS CTAB. Has TLSO brace CARDIAC: RRR, no murmur,no extra heart sounds, BLE 2+ edema, R > L GI: Abdomen soft, normal BS, no masses, no tenderness NEUROLOGICAL: There is no tremor. Speech is clear. Alert and oriented X 3 PSYCHIATRIC:  Affect and behavior are appropriate  Labs reviewed: Recent Labs    08/01/20 0559 08/01/20 1226 08/03/20 0804 08/04/20 0425  08/06/20 0652 08/07/20 0208 08/08/20 0056  NA 137   < > 139   < > 141 139 137  K 5.6*   < > 4.2   < > 3.8 3.7 3.5  CL 107   < > 111   < > 109 107 106  CO2 22   < > 20*   < > 24 25 26   GLUCOSE 194*   < > 76   < > 100* 96 113*  BUN 68*   < > 72*   < > 62* 68* 62*  CREATININE 2.68*   < > 2.04*   < > 1.68* 1.76* 1.53*  CALCIUM 8.7*   < > 7.7*   < > 8.3* 8.3* 8.5*  MG 1.9  --  2.0  --   --   --   --   PHOS 6.4*  --   --   --   --   --   --    < > =  values in this interval not displayed.   Recent Labs    08/01/20 0559 08/03/20 1159  AST 21  --   ALT 15  --   ALKPHOS 86  --   BILITOT 0.7  --   PROT 6.4*  --   ALBUMIN 2.7* 2.4*   Recent Labs    08/06/20 0652 08/07/20 0208 08/08/20 0056  WBC 9.8 10.5 12.4*  NEUTROABS 7.2 7.2 9.3*  HGB 8.8* 8.5* 8.5*  HCT 27.3* 26.6* 26.9*  MCV 87.8 87.2 87.9  PLT 204 204 237   Lab Results  Component Value Date   TSH 1.188 08/01/2020   Lab Results  Component Value Date   HGBA1C 5.4 08/01/2020   Lab Results  Component Value Date   CHOL 116 08/31/2011   HDL 21 (L) 08/31/2011   LDLCALC 35 08/31/2011   TRIG 221 (H) 08/06/2020   CHOLHDL 5.5 08/31/2011    Significant Diagnostic Results in last 30 days:  CT ABDOMEN PELVIS WO CONTRAST  Result Date: 08/02/2020 CLINICAL DATA:  Nausea and vomiting. EXAM: CT ABDOMEN AND PELVIS WITHOUT CONTRAST TECHNIQUE: Multidetector CT imaging of the abdomen and pelvis was performed following the standard protocol without IV contrast. COMPARISON:  X-ray abdomen 08/01/2020 FINDINGS: Lower chest: Interval increase in size of a heterogeneous moderate to large volume right pleural effusion. Associated complete collapse of the right lower lobe. Trace left pleural effusion. Hepatobiliary: No focal liver abnormality is seen. Status post cholecystectomy. No biliary dilatation. Pancreas: No focal lesion. Normal pancreatic contour. No surrounding inflammatory changes. No main pancreatic ductal dilatation. Spleen: Normal  in size without focal abnormality. Adrenals/Urinary Tract: No adrenal nodule bilaterally. Nonspecific bilateral perinephric stranding. Bilateral renal calcifications measuring up to 5 mm. No hydronephrosis. Exophytic 1.7 cm fluid density lesion within the right kidney likely represents a simple renal cyst. Otherwise no contour-deforming renal mass. No ureterolithiasis or hydroureter. Limited evaluation of distal ureters due to streak artifact originating from the right femoral surgical hardware. The urinary bladder is decompressed with a Foley catheter terminating within its lumen. There is limited evaluation of the urinary bladder due to streak artifact originating from the right surgical femoral hardware. Stomach/Bowel: Enteric tube coursing within the gastric lumen with side port just distal to the gastroesophageal junction. Stomach is within normal limits. No evidence of bowel wall thickening or dilatation. Scattered colonic diverticulosis. Appendix appears normal. Vascular/Lymphatic: No abdominal aorta or iliac aneurysm. Moderate atherosclerotic plaque of the aorta and its branches. No abdominal, pelvic, or inguinal lymphadenopathy. Reproductive: Uterus and bilateral adnexa are unremarkable. Other: No intraperitoneal free fluid. No intraperitoneal free gas. No organized fluid collection. Musculoskeletal: Anterior abdominal hernia repair with mesh. Interval placement of partially visualized posterolateral thoracic fusion surgical hardware in a patient with T10-T11 acute vertebral body fracture with extension to the posterior elements. Slight angulation of the left T11 (3:12) surgical hardware with no violation of the spinal canal or findings to suggest impingement on the nerve roots. Overlying skin staples and subcutaneus soft tissue edema. Multilevel severe degenerative changes of the spine with fusion of the L3 through L5 vertebral bodies leading to straightening of the normal lumbar lordosis. Mild  retrolisthesis of L5 on S1. No suspicious lytic or blastic osseous lesions. Total right hip arthroplasty.  Degenerative changes of the left hip. IMPRESSION: 1. Interval increase in size of a heterogeneous moderate to large volume right pleural effusion. Areas of increased density within the pleural fluid may represent hemothorax, collapsed right lower lobe lung, or less likely a mass lesion.  2. Interval development of trace left pleural effusion. 3. Nonspecific bilateral perinephric stranding. Consider correlation with urinalysis for infection. 4. Bilateral nonobstructive nephrolithiasis measuring up to 5 mm. 5. Scattered colonic diverticulosis with no acute diverticulitis. 6. Known T10-T11 fracture with interval placement of partially visualized posterolateral thoracic fusion surgical hardware. 7. Severe degenerative changes of the lumbar spine with L3 through L5 vertebral body fusion. 8.  Aortic Atherosclerosis (ICD10-I70.0). Electronically Signed   By: Iven Finn M.D.   On: 08/02/2020 00:09   DG Ribs Bilateral W/Chest  Result Date: 07/31/2020 CLINICAL DATA:  Fall.  Pain. EXAM: BILATERAL RIBS AND CHEST - 4+ VIEW COMPARISON:  Remote chest radiograph 05/03/2012. No interval chest imaging. FINDINGS: No fracture or other bone lesions are seen involving the ribs. Right pleural effusion at least moderate in size. No pneumothorax. Heart size and mediastinal contours are within normal limits. Aortic atherosclerosis. IMPRESSION: 1. No evidence of rib fracture. 2. Right pleural effusion at least moderate in size. This is of unknown acuity. Electronically Signed   By: Keith Rake M.D.   On: 07/31/2020 20:39   DG Thoracic Spine 2 View  Result Date: 07/31/2020 CLINICAL DATA:  Unwitnessed fall.  Pain. EXAM: THORACIC SPINE 2 VIEWS COMPARISON:  None. FINDINGS: Broad-based dextroscoliotic curvature. No evidence of acute fracture or compression deformity. Flowing anterior osteophytes throughout the thoracic spine.  Minimal disc space narrowing at T10-T11 and T11-T12. no paravertebral soft tissue abnormality IMPRESSION: 1. No radiographic evidence of acute fracture of the thoracic spine. 2. Scoliosis and degenerative change. Electronically Signed   By: Keith Rake M.D.   On: 07/31/2020 20:36   DG THORACOLUMABAR SPINE  Result Date: 08/01/2020 CLINICAL DATA:  T8 through 11 fusion. EXAM: THORACOLUMBAR SPINE 1V COMPARISON:  Preoperative imaging. FINDINGS: Two fluoroscopic spot views of the thoracic spine obtained in frontal and lateral projections. Pedicle rods in the lower thoracic spine, levels difficult to accurately delineate on coned view. Total fluoroscopy time 20 seconds. Total dose 7.78 mGy. IMPRESSION: Intraoperative fluoroscopy during thoracic fusion. Electronically Signed   By: Keith Rake M.D.   On: 08/01/2020 16:16   DG Lumbar Spine Complete  Result Date: 07/31/2020 CLINICAL DATA:  Unwitnessed fall.  Pain. EXAM: LUMBAR SPINE - COMPLETE 4+ VIEW COMPARISON:  None. FINDINGS: Five non-rib-bearing lumbar vertebra. Straightening of normal lordosis. Near complete disc space loss at L4-L5 with prominent disc space narrowing at L3-L4 and L5-S1. Endplate spurring with lesser disc space narrowing at L1-L2 and L2-L3. Multilevel facet hypertrophy. Vertebral body heights are preserved. No evidence of fracture. Degenerative change of both sacroiliac joints. IMPRESSION: 1. No fracture of the lumbar spine. 2. Straightening of normal lordosis may be positioning or muscle spasm. Multilevel degenerative disc disease and facet hypertrophy. Electronically Signed   By: Keith Rake M.D.   On: 07/31/2020 20:37   DG Forearm Left  Result Date: 08/01/2020 CLINICAL DATA:  Fall yesterday with swelling. EXAM: LEFT FOREARM - 2 VIEW COMPARISON:  None. FINDINGS: Advanced degenerative changes involving the carpal bones and radiocarpal articulation. There is also more mild to moderate degenerative change about the elbow. No  acute fracture or dislocation. No elbow joint effusion. Suboptimal patient positioning secondary to clinical status. Possible soft tissue swelling adjacent the proximal ulna. IMPRESSION: Advanced degenerative change, without acute osseous abnormality. Electronically Signed   By: Abigail Miyamoto M.D.   On: 08/01/2020 13:48   CT Head Wo Contrast  Result Date: 07/31/2020 CLINICAL DATA:  Recent fall from chair, initial encounter EXAM: CT HEAD WITHOUT CONTRAST  CT CERVICAL SPINE WITHOUT CONTRAST TECHNIQUE: Multidetector CT imaging of the head and cervical spine was performed following the standard protocol without intravenous contrast. Multiplanar CT image reconstructions of the cervical spine were also generated. COMPARISON:  None. FINDINGS: CT HEAD FINDINGS Brain: No evidence of acute infarction, hemorrhage, hydrocephalus, extra-axial collection or mass lesion/mass effect. Calcification is noted in the midportion of the pons. Mild chronic white matter ischemic changes are noted. Vascular: No hyperdense vessel or unexpected calcification. Skull: Normal. Negative for fracture or focal lesion. Sinuses/Orbits: No acute finding. Other: Mild parietal scalp hematoma is noted in the vertex on the left. CT CERVICAL SPINE FINDINGS Alignment: Mild loss of normal cervical lordosis is noted likely related to muscular spasm Skull base and vertebrae: 7 cervical segments are well visualized. Vertebral body height is well maintained. Multilevel osteophytic changes and facet hypertrophic changes are noted. No acute fracture or acute facet abnormality is seen. Soft tissues and spinal canal: Surrounding soft tissue structures show vascular calcifications. Enlargement of the right lobe of the thyroid is noted with evidence of calcifications and a nodule which measures at least 2.5 cm. No other soft tissue abnormality is noted. Upper chest: Visualized lung apices demonstrate evidence of a right-sided pleural effusion. This is of uncertain  chronicity. No rib abnormality is noted. Other: None IMPRESSION: CT of the head: Left parietal scalp hematoma. No acute intracranial abnormality is noted. CT of the cervical spine: Multilevel degenerative changes without acute bony abnormality. Right-sided pleural effusion. Prominent right lobe of the thyroid with at least 1 nodule within. Recommend nonemergent thyroid US (ref: J Am Coll Radiol. 2015 Feb;12(2): 143-50). Electronically Signed   By: Inez Catalina M.D.   On: 07/31/2020 18:10   CT Chest Wo Contrast  Result Date: 07/31/2020 CLINICAL DATA:  Fall.  Chest trauma. EXAM: CT CHEST WITHOUT CONTRAST TECHNIQUE: Multidetector CT imaging of the chest was performed following the standard protocol without IV contrast. COMPARISON:  Ribbon thoracic spine radiographs earlier today. FINDINGS: Cardiovascular: Moderate aortic atherosclerosis. No periaortic stranding to suggest injury. There are coronary artery calcifications. Heart is normal in size, slightly displaced into the left chest. No pericardial effusion. Mediastinum/Nodes: Scattered small mediastinal lymph nodes, not enlarged by size criteria. No esophageal wall thickening. Heterogeneously enlarged right lobe of the thyroid gland with scattered curvilinear calcifications. Included left thyroid lobe is normal. Lungs/Pleura: Moderate to large right pleural effusion, heterogeneous density. Areas of increased density within the pleural fluid may represent hemorrhage, collapsed lung or less likely masses. Small amount of pleural fluid appears loculated and tracks anteromedially. No pneumothorax. Left lung is clear. No left pleural effusion. Upper Abdomen: No definite free fluid in the upper abdomen. Diverticulosis at the splenic flexure of the colon. Cholecystectomy. Musculoskeletal: Bones are diffusely under mineralized. Diffuse thoracic ankylosis. Displaced fracture through T10 vertebral body involves the anterior third, extend into the T9-T10 disc space, and is  displaced anteriorly. Fracture extends through anterior osteophytes as well as the anterior superior aspect of T11 vertebral body. No convincing extension to involve the posterior elements. There is adjacent paravertebral hemorrhage and small volume of gas, likely related to escaped vacuum phenomenon. No additional thoracic spine fracture. Diffuse ankylosis with flowing anterior osteophytes. No acute fracture of the ribs, included clavicles or shoulder girdles. No sternal fracture. IMPRESSION: 1. Extension injury of the thoracic spine with displaced T10 vertebral body fracture extending into the T9-T10 disc space and anterior vertebral body. Fracture extends through anterior osteophytes as well as the anterior superior aspect of T11 vertebral  body. No convincing extension to involve the posterior elements. There is adjacent paravertebral hemorrhage and small volume of gas, likely related to escaped vacuum phenomenon. Recommend spine consultation. 2. Moderate to large right pleural effusion, heterogeneous density. Areas of increased density within the pleural fluid may represent hemorrhage, collapsed lung or less likely masses. Recommend continued radiographic follow-up to ensure resolution. No pneumothorax or rib fractures. 3. Heterogeneously enlarged right lobe of the thyroid gland with scattered curvilinear calcifications. Recommend thyroid US (ref: J Am Coll Radiol. 2015 Feb;12(2): 143-50). Aortic Atherosclerosis (ICD10-I70.0). Electronically Signed   By: Keith Rake M.D.   On: 07/31/2020 22:29   CT Cervical Spine Wo Contrast  Result Date: 07/31/2020 CLINICAL DATA:  Recent fall from chair, initial encounter EXAM: CT HEAD WITHOUT CONTRAST CT CERVICAL SPINE WITHOUT CONTRAST TECHNIQUE: Multidetector CT imaging of the head and cervical spine was performed following the standard protocol without intravenous contrast. Multiplanar CT image reconstructions of the cervical spine were also generated. COMPARISON:   None. FINDINGS: CT HEAD FINDINGS Brain: No evidence of acute infarction, hemorrhage, hydrocephalus, extra-axial collection or mass lesion/mass effect. Calcification is noted in the midportion of the pons. Mild chronic white matter ischemic changes are noted. Vascular: No hyperdense vessel or unexpected calcification. Skull: Normal. Negative for fracture or focal lesion. Sinuses/Orbits: No acute finding. Other: Mild parietal scalp hematoma is noted in the vertex on the left. CT CERVICAL SPINE FINDINGS Alignment: Mild loss of normal cervical lordosis is noted likely related to muscular spasm Skull base and vertebrae: 7 cervical segments are well visualized. Vertebral body height is well maintained. Multilevel osteophytic changes and facet hypertrophic changes are noted. No acute fracture or acute facet abnormality is seen. Soft tissues and spinal canal: Surrounding soft tissue structures show vascular calcifications. Enlargement of the right lobe of the thyroid is noted with evidence of calcifications and a nodule which measures at least 2.5 cm. No other soft tissue abnormality is noted. Upper chest: Visualized lung apices demonstrate evidence of a right-sided pleural effusion. This is of uncertain chronicity. No rib abnormality is noted. Other: None IMPRESSION: CT of the head: Left parietal scalp hematoma. No acute intracranial abnormality is noted. CT of the cervical spine: Multilevel degenerative changes without acute bony abnormality. Right-sided pleural effusion. Prominent right lobe of the thyroid with at least 1 nodule within. Recommend nonemergent thyroid US (ref: J Am Coll Radiol. 2015 Feb;12(2): 143-50). Electronically Signed   By: Inez Catalina M.D.   On: 07/31/2020 18:10   MR BRAIN WO CONTRAST  Result Date: 08/04/2020 CLINICAL DATA:  Seizure with abnormal mental status EXAM: MRI HEAD WITHOUT CONTRAST TECHNIQUE: Multiplanar, multiecho pulse sequences of the brain and surrounding structures were obtained  without intravenous contrast. COMPARISON:  CT and CTA from 2 days ago FINDINGS: Brain: No acute infarction, hemorrhage, hydrocephalus, extra-axial collection or mass lesion. Extensive low-density in the cerebral white matter attributed to chronic small vessel ischemia. There is both gliosis and scattered white matter lacunes. Cystic changes at the bilateral globus pallidus which may reflect remote insult. Preserved brain volume for age. Vascular: Preserved flow voids Skull and upper cervical spine: Normal marrow signal. 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 Artifact in the scalp where there are skin staples by prior CT. Sinuses/Orbits: No acute finding Other: Truncated and motion degraded study. The patient was stopped early due to hypoxia. IMPRESSION: 1. No acute finding. 2. Extensive chronic small vessel disease. 3. Motion degraded and truncated study due to patient condition. Electronically Signed   By: Angelica Chessman  Watts M.D.   On: 08/04/2020 09:58   DG Chest Port 1 View  Result Date: 08/04/2020 CLINICAL DATA:  Evaluate respiratory distress EXAM: PORTABLE CHEST 1 VIEW COMPARISON:  Yesterday FINDINGS: Cardiomegaly, indistinct hila, and diffuse interstitial and hazy airspace opacity. There are likely small pleural effusions. No pneumothorax. Postoperative thoracic spine. IMPRESSION: CHF. Electronically Signed   By: Monte Fantasia M.D.   On: 08/04/2020 10:42   DG CHEST PORT 1 VIEW  Result Date: 08/03/2020 CLINICAL DATA:  Status post right thoracentesis EXAM: PORTABLE CHEST 1 VIEW COMPARISON:  08/02/2020 FINDINGS: Improvement in the right effusion following thoracentesis. There is better aeration in the right lower lung. Negative for pneumothorax. Residual bibasilar atelectasis and left lower lobe airspace opacity may represent pneumonia. Heart is enlarged. Postop changes from lower thoracic fusion evident. NG tube extends below the hemidiaphragms with the tip not visualized as it enters the  stomach. Endotracheal tube 3 cm above the carina. IMPRESSION: Improvement in the right effusion following thoracentesis. Negative for pneumothorax. Otherwise stable portable chest exam. Electronically Signed   By: Jerilynn Mages.  Shick M.D.   On: 08/03/2020 13:32   DG CHEST PORT 1 VIEW  Result Date: 08/02/2020 CLINICAL DATA:  Intubation, ventilator dependence EXAM: PORTABLE CHEST 1 VIEW COMPARISON:  Portable exam 1557 hours compared to 07/31/2020 FINDINGS: Tip of endotracheal tube projects 1.9 cm above carina. Nasogastric tube extends into abdomen. Normal heart size, mediastinal contours, and pulmonary vascularity. Diffuse pulmonary infiltrates greater on LEFT, question asymmetric edema versus pneumonia. Persistent RIGHT pleural effusion. No pneumothorax or acute osseous findings. Bones demineralized with prior thoracic spinal fixation. IMPRESSION: Increased pulmonary infiltrates question edema versus infection. Persistent RIGHT pleural effusion. Electronically Signed   By: Lavonia Dana M.D.   On: 08/02/2020 17:19   DG Abd Portable 1V  Result Date: 08/01/2020 CLINICAL DATA:  NG tube placement EXAM: PORTABLE ABDOMEN - 1 VIEW COMPARISON:  None FINDINGS: Partially visualized thoracic spinal hardware. Esophageal tube tip and side port overlie the mid stomach. Gas pattern unremarkable IMPRESSION: Esophageal tube tip and side port overlie the mid stomach. Electronically Signed   By: Donavan Foil M.D.   On: 08/01/2020 17:21   EEG adult  Result Date: 08/02/2020 Lora Havens, MD     08/02/2020  9:08 PM Patient Name: Jacqueline Richmond MRN: [id number] Epilepsy Attending: Lora Havens Referring Physician/Provider: Dr Donnetta Simpers Date: 08/02/2020 Duration: 24.40 mins Patient history: 75 y.o. female with PMH significant for CKD, HTN, Gout, HTN, DM2, HLd who is admitted with T spine fx s/p fall who had sudden unresponsiveness with stiffening of BL upper extremities and starring off. EEG to evaluate for seizure Level of  alertness:  comatose AEDs during EEG study: LEV Technical aspects: This EEG study was done with scalp electrodes positioned according to the 10-20 International system of electrode placement. Electrical activity was acquired at a sampling rate of 500Hz  and reviewed with a high frequency filter of 70Hz  and a low frequency filter of 1Hz . EEG data were recorded continuously and digitally stored. Description: EEG showed continuous generalized 3 to 6 Hz theta-delta slowing admixed with 15-18hz  fronto-central beta activity. Hyperventilation and photic stimulation were not performed.   ABNORMALITY - Continuous slow, generalized IMPRESSION: This study is suggestive of moderate diffuse encephalopathy, nonspecific etiology but likely related to sedation. No seizures or epileptiform discharges were seen throughout the recording. Priyanka Barbra Sarks   Overnight EEG with video  Result Date: 08/03/2020 Lora Havens, MD     08/03/2020 11:20 AM Patient  Name: Jacqueline Richmond MRN: [id number] Epilepsy Attending: Lora Havens Referring Physician/Provider: Dr Donnetta Simpers Duration: 08/02/2020 1809 to 08/03/2020 1004  Patient history: 75 y.o.femalewith PMH significant for CKD, HTN, Gout, HTN, DM2, HLd who is admitted with T spine fx s/p fall who had sudden unresponsiveness with stiffening of BL upper extremities and starring off. EEG to evaluate for seizure  Level of alertness:  awake, asleep  AEDs during EEG study: LEV, Propofol  Technical aspects: This EEG study was done with scalp electrodes positioned according to the 10-20 International system of electrode placement. Electrical activity was acquired at a sampling rate of 500Hz  and reviewed with a high frequency filter of 70Hz  and a low frequency filter of 1Hz . EEG data were recorded continuously and digitally stored.  Description: The posterior dominant rhythm consists of 9 Hz activity of moderate voltage (25-35 uV) seen predominantly in posterior head regions,  symmetric and reactive to eye opening and eye closing. Sleep was characterized by vertex waves, sleep spindles (12 to 14 Hz), maximal frontocentral region. EEG showed continuous generalized 3 to 6 Hz theta-delta slowing admixed with 15-18hz  fronto-central beta activity. Hyperventilation and photic stimulation were not performed.    ABNORMALITY - Continuous slow, generalized  IMPRESSION: This study is suggestive of moderate diffuse encephalopathy, nonspecific etiology but likely related to sedation. No seizures or epileptiform discharges were seen throughout the recording.  Lora Havens   DG C-Arm [phone] Min  Result Date: 08/01/2020 CLINICAL DATA:  T8 through 11 fusion. EXAM: THORACOLUMBAR SPINE 1V COMPARISON:  Preoperative imaging. FINDINGS: Two fluoroscopic spot views of the thoracic spine obtained in frontal and lateral projections. Pedicle rods in the lower thoracic spine, levels difficult to accurately delineate on coned view. Total fluoroscopy time 20 seconds. Total dose 7.78 mGy. IMPRESSION: Intraoperative fluoroscopy during thoracic fusion. Electronically Signed   By: Keith Rake M.D.   On: 08/01/2020 16:16   DG C-Arm 1-60 Min-No Report  Result Date: 08/07/2020 Fluoroscopy was utilized by the requesting physician.  No radiographic interpretation.   ECHOCARDIOGRAM COMPLETE  Result Date: 08/03/2020    ECHOCARDIOGRAM REPORT   Patient Name:   Jacqueline Richmond Date of Exam: 08/03/2020 Medical Rec #:  [id number]     Height:       63.0 in Accession #:    [id number]    Weight:       186.0 lb Date of Birth:  1945-08-02      BSA:          1.875 m Patient Age:    22 years      BP:           95/54 mmHg Patient Gender: F             HR:           89 bpm. Exam Location:  Inpatient Procedure: 2D Echo, Cardiac Doppler and Color Doppler Indications:    Pleural effusion  History:        Patient has no prior history of Echocardiogram examinations.                 Risk Factors:Diabetes, Hypertension and  Dyslipidemia.  Sonographer:    Luisa Hart RDCS Referring Phys: [place]  1. Left ventricular ejection fraction, by estimation, is 55 to 60%. The left ventricle has normal function. The left ventricle has no regional wall motion abnormalities. Left ventricular diastolic parameters are consistent with Grade I diastolic dysfunction (impaired relaxation).  2. Right ventricular  systolic function is normal. The right ventricular size is normal. There is normal pulmonary artery systolic pressure. The estimated right ventricular systolic pressure is 87.5 mmHg.  3. The mitral valve is normal in structure. Trivial mitral valve regurgitation. No evidence of mitral stenosis.  4. The aortic valve is tricuspid. Aortic valve regurgitation is not visualized. Mild aortic valve sclerosis is present, with no evidence of aortic valve stenosis.  5. The inferior vena cava is normal in size with greater than 50% respiratory variability, suggesting right atrial pressure of 3 mmHg. FINDINGS  Left Ventricle: Left ventricular ejection fraction, by estimation, is 55 to 60%. The left ventricle has normal function. The left ventricle has no regional wall motion abnormalities. The left ventricular internal cavity size was normal in size. There is  no left ventricular hypertrophy. Left ventricular diastolic parameters are consistent with Grade I diastolic dysfunction (impaired relaxation). Right Ventricle: The right ventricular size is normal. No increase in right ventricular wall thickness. Right ventricular systolic function is normal. There is normal pulmonary artery systolic pressure. The tricuspid regurgitant velocity is 2.69 m/s, and  with an assumed right atrial pressure of 3 mmHg, the estimated right ventricular systolic pressure is 64.3 mmHg. Left Atrium: Left atrial size was normal in size. Right Atrium: Right atrial size was normal in size. Pericardium: Trivial pericardial effusion is present. Mitral Valve:  The mitral valve is normal in structure. Mild mitral annular calcification. Trivial mitral valve regurgitation. No evidence of mitral valve stenosis. Tricuspid Valve: The tricuspid valve is normal in structure. Tricuspid valve regurgitation is trivial. Aortic Valve: The aortic valve is tricuspid. Aortic valve regurgitation is not visualized. Mild aortic valve sclerosis is present, with no evidence of aortic valve stenosis. Aortic valve mean gradient measures 3.0 mmHg. Aortic valve peak gradient measures 6.2 mmHg. Aortic valve area, by VTI measures 2.57 cm. Pulmonic Valve: The pulmonic valve was normal in structure. Pulmonic valve regurgitation is trivial. Aorta: The aortic root is normal in size and structure. Venous: The inferior vena cava is normal in size with greater than 50% respiratory variability, suggesting right atrial pressure of 3 mmHg. IAS/Shunts: No atrial level shunt detected by color flow Doppler.  LEFT VENTRICLE PLAX 2D LVIDd:         5.70 cm     Diastology LVIDs:         2.90 cm     LV e' medial:    5.87 cm/s LV PW:         1.00 cm     LV E/e' medial:  20.4 LV IVS:        0.80 cm     LV e' lateral:   6.53 cm/s LVOT diam:     2.00 cm     LV E/e' lateral: 18.4 LV SV:         68 LV SV Index:   36 LVOT Area:     3.14 cm  LV Volumes (MOD) LV vol d, MOD A2C: 55.0 ml LV vol d, MOD A4C: 68.9 ml LV vol s, MOD A2C: 22.4 ml LV vol s, MOD A4C: 26.1 ml LV SV MOD A2C:     32.6 ml LV SV MOD A4C:     68.9 ml LV SV MOD BP:      40.3 ml RIGHT VENTRICLE RV S prime:     11.00 cm/s TAPSE (M-mode): 2.6 cm LEFT ATRIUM           Index       RIGHT ATRIUM  Index LA diam:      4.00 cm 2.13 cm/m  RA Area:     11.70 cm LA Vol (A4C): 41.4 ml 22.08 ml/m RA Volume:   25.60 ml  13.65 ml/m  AORTIC VALVE                   PULMONIC VALVE AV Area (Vmax):    2.30 cm    PV Vmax:       1.06 m/s AV Area (Vmean):   2.52 cm    PV Vmean:      80.000 cm/s AV Area (VTI):     2.57 cm    PV VTI:        0.210 m AV Vmax:            125.00 cm/s PV Peak grad:  4.5 mmHg AV Vmean:          84.600 cm/s PV Mean grad:  3.0 mmHg AV VTI:            0.264 m AV Peak Grad:      6.2 mmHg AV Mean Grad:      3.0 mmHg LVOT Vmax:         91.70 cm/s LVOT Vmean:        67.800 cm/s LVOT VTI:          0.216 m LVOT/AV VTI ratio: 0.82  AORTA Ao Root diam: 3.40 cm Ao Asc diam:  3.50 cm MITRAL VALVE                TRICUSPID VALVE MV Area (PHT): 2.66 cm     TR Peak grad:   28.9 mmHg MV Decel Time: 285 msec     TR Vmax:        269.00 cm/s MR Peak grad: 104.9 mmHg MR Mean grad: 70.0 mmHg     SHUNTS MR Vmax:      512.00 cm/s   Systemic VTI:  0.22 m MR Vmean:     397.0 cm/s    Systemic Diam: 2.00 cm MV E velocity: 120.00 cm/s MV A velocity: 118.00 cm/s MV E/A ratio:  1.02 Loralie Champagne MD Electronically signed by Loralie Champagne MD Signature Date/Time: 08/03/2020/3:44:06 PM    Final    US THYROID  Result Date: 08/02/2020 CLINICAL DATA:  Thyroid enlargement EXAM: THYROID ULTRASOUND TECHNIQUE: Ultrasound examination of the thyroid gland and adjacent soft tissues was performed. COMPARISON:  None. FINDINGS: Parenchymal Echotexture: Mildly heterogeneous Isthmus: 0.4 cm Right lobe: 6.7 x 4.0 x 4.3 cm Left lobe: 4.0 x 2.0 x 1.1 cm _________________________________________________________ Estimated total number of nodules >/= 1 cm: 3 Number of spongiform nodules >/=  2 cm not described below (TR1): 0 Number of mixed cystic and solid nodules >/= 1.5 cm not described below (TR2): 0 _________________________________________________________ Nodule # 1: Location: Right; mid Maximum size: 3.8 cm; Other 2 dimensions: 3.5 x 3.4 cm Composition: solid/almost completely solid (2) Echogenicity: isoechoic (1) Shape: not taller-than-wide (0) Margins: smooth (0) Echogenic foci: none (0) ACR TI-RADS total points: 3. ACR TI-RADS risk category: TR3 (3 points). ACR TI-RADS recommendations: **Given size (>/= 2.5 cm) and appearance, fine needle aspiration of this mildly suspicious nodule should be  considered based on TI-RADS criteria. _________________________________________________________ Nodule # 2: Location: Right; mid Maximum size: 2.1 cm; Other 2 dimensions: 1.5 x 1.7 cm Composition: solid/almost completely solid (2) Echogenicity: isoechoic (1) Shape: not taller-than-wide (0) Margins: smooth (0) Echogenic foci: none (0) ACR TI-RADS total points: 3. ACR TI-RADS risk category:  TR3 (3 points). ACR TI-RADS recommendations: *Given size (>/= 1.5 - 2.4 cm) and appearance, a follow-up ultrasound in 1 year should be considered based on TI-RADS criteria. _________________________________________________________ Nodule # 3: Location: Right; inferior Maximum size: 2.7 cm; Other 2 dimensions: 2.5 x 2.5 cm Composition: solid/almost completely solid (2) Echogenicity: hypoechoic (2) Shape: not taller-than-wide (0) Margins: smooth (0) Echogenic foci: none (0) ACR TI-RADS total points: 4. ACR TI-RADS risk category: TR4 (4-6 points). ACR TI-RADS recommendations: **Given size (>/= 1.5 cm) and appearance, fine needle aspiration of this moderately suspicious nodule should be considered based on TI-RADS criteria. _________________________________________________________ IMPRESSION: 1. Nodule 1 (TI-RADS 3) and nodule 3 (TI-RADS 4) meets criteria for FNA. 2. Nodule 2 (TI-RADS 3) meets criteria for surveillance. Follow-up thyroid ultrasound should be performed in 1 year. The above is in keeping with the ACR TI-RADS recommendations - J Am Coll Radiol 2017;14:587-595. Electronically Signed   By: Miachel Roux M.D.   On: 08/02/2020 07:17   CT HEAD CODE STROKE WO CONTRAST  Result Date: 08/02/2020 CLINICAL DATA:  Code stroke. Unresponsive. Code stroke presentation. EXAM: CT HEAD WITHOUT CONTRAST TECHNIQUE: Contiguous axial images were obtained from the base of the skull through the vertex without intravenous contrast. 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 Cerebral hemispheres show extensive chronic small-vessel ischemic changes throughout the deep and subcortical white matter, basal ganglia and thalami. I do not identify an acute infarction, but acute infarction could be hidden within the extensive chronic changes. No mass, hemorrhage, hydrocephalus or extra-axial collection. Vascular: There is atherosclerotic calcification of the major vessels at the base of the brain. Skull: No skull fracture. Sinuses/Orbits: Clear presently. Chronic mucoperiosteal thickening of the left maxillary sinus. Orbits negative. Other: Left posterior parietal scalp staples. Adjacent sebaceous cyst of the scalp. ASPECTS [facility] Stroke Program Early CT Score) - Ganglionic level infarction (caudate, lentiform nuclei, internal capsule, insula, M1-M3 cortex): 7 - Supraganglionic infarction (M4-M6 cortex): 3 Total score (0-10 with 10 being normal): 10 IMPRESSION: 1. No acute finding by CT. 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 Chronic calcification of the right pons. 2. ASPECTS is 10 3. Discussed by telephone with Dr. Lorrin Goodell at 1412 hours. Electronically Signed   By: Nelson Chimes M.D.   On: 08/02/2020 14:14   CT ANGIO HEAD CODE STROKE  Result Date: 08/02/2020 CLINICAL DATA:  Unresponsive EXAM: CT ANGIOGRAPHY HEAD AND NECK TECHNIQUE: Multidetector CT imaging of the head and neck was performed using the standard protocol during bolus administration of intravenous contrast. Multiplanar CT image reconstructions and MIPs were obtained to evaluate the vascular anatomy. Carotid stenosis measurements (when applicable) are obtained utilizing NASCET criteria, using the distal internal carotid diameter as the denominator. CONTRAST:  68mL OMNIPAQUE IOHEXOL 350 MG/ML SOLN COMPARISON:  None. FINDINGS: CTA NECK Aortic arch: Calcified plaque along the arch and patent great vessel origins. Right carotid system: Patent. Common carotid is mildly  displaced by enlarged thyroid. Calcified plaque at the bifurcation and proximal internal carotid with minimal stenosis. Left carotid system: Patent. Calcified plaque at the common carotid bifurcation and proximal internal carotid causing minimal stenosis. Vertebral arteries: Patent and codominant.  No stenosis. Skeleton: Advanced cervical spine degenerative changes. Degenerative changes of temporomandibular joints. Other neck: Enlarged, heterogeneous thyroid is evaluated on recent ultrasound. Endotracheal and enteric tubes are present. Upper chest: Right much greater than left pleural effusions and left lung consolidation and ground-glass density. Review of the MIP images confirms  the above findings CTA HEAD Anterior circulation: The intracranial internal carotid arteries are patent with calcified plaque causing mild stenosis. Anterior and middle cerebral arteries are patent. Posterior circulation: Intracranial vertebral arteries are patent with calcified plaque causing up to moderate stenosis. Basilar artery is patent. Major cerebellar artery origins are patent. Bilateral posterior communicating arteries are present. Posterior cerebral arteries are patent. Venous sinuses: Patent as allowed by contrast bolus timing. Review of the MIP images confirms the above findings IMPRESSION: No large vessel occlusion, hemodynamically significant stenosis, or evidence of dissection. Partially imaged right much greater than left pleural effusions. Density of pleural fluid is greater than simple fluid. Left lung consolidation and ground-glass density is new from 07/31/2020. Electronically Signed   By: Macy Mis M.D.   On: 08/02/2020 14:43   CT ANGIO NECK CODE STROKE  Result Date: 08/02/2020 CLINICAL DATA:  Unresponsive EXAM: CT ANGIOGRAPHY HEAD AND NECK TECHNIQUE: Multidetector CT imaging of the head and neck was performed using the standard protocol during bolus administration of intravenous contrast. Multiplanar CT  image reconstructions and MIPs were obtained to evaluate the vascular anatomy. Carotid stenosis measurements (when applicable) are obtained utilizing NASCET criteria, using the distal internal carotid diameter as the denominator. CONTRAST:  28mL OMNIPAQUE IOHEXOL 350 MG/ML SOLN COMPARISON:  None. FINDINGS: CTA NECK Aortic arch: Calcified plaque along the arch and patent great vessel origins. Right carotid system: Patent. Common carotid is mildly displaced by enlarged thyroid. Calcified plaque at the bifurcation and proximal internal carotid with minimal stenosis. Left carotid system: Patent. Calcified plaque at the common carotid bifurcation and proximal internal carotid causing minimal stenosis. Vertebral arteries: Patent and codominant.  No stenosis. Skeleton: Advanced cervical spine degenerative changes. Degenerative changes of temporomandibular joints. Other neck: Enlarged, heterogeneous thyroid is evaluated on recent ultrasound. Endotracheal and enteric tubes are present. Upper chest: Right much greater than left pleural effusions and left lung consolidation and ground-glass density. Review of the MIP images confirms the above findings CTA HEAD Anterior circulation: The intracranial internal carotid arteries are patent with calcified plaque causing mild stenosis. Anterior and middle cerebral arteries are patent. Posterior circulation: Intracranial vertebral arteries are patent with calcified plaque causing up to moderate stenosis. Basilar artery is patent. Major cerebellar artery origins are patent. Bilateral posterior communicating arteries are present. Posterior cerebral arteries are patent. Venous sinuses: Patent as allowed by contrast bolus timing. Review of the MIP images confirms the above findings IMPRESSION: No large vessel occlusion, hemodynamically significant stenosis, or evidence of dissection. Partially imaged right much greater than left pleural effusions. Density of pleural fluid is greater than  simple fluid. Left lung consolidation and ground-glass density is new from 07/31/2020. Electronically Signed   By: Macy Mis M.D.   On: 08/02/2020 14:43    Assessment/Plan  1. Type 2 diabetes mellitus with stage 3b chronic kidney disease, without long-term current use of insulin (HCC) Lab Results  Component Value Date   HGBA1C 5.4 08/01/2020   -  CBGs low, will decrease from 10 mg daily to 5 mg daily  2. Chronic diastolic (congestive) heart failure (HCC) -  No SOB -   Continue furosemide 40 mg daily  3. Acute blood loss anemia Lab Results  Component Value Date   HGB 8.5 (L) 08/08/2020   -   Continue ferrous sulfate  4. Closed fracture of tenth thoracic vertebra with delayed healing, unspecified fracture morphology, subsequent encounter -  S/P stabilization of T10-11 on 08/01/2020 -  Continue TLSO brace -   Follow-up  with neurosurgery     Family/ staff Communication:   Discussed plan of care with resident and charge nurse.  Labs/tests ordered:  None  Goals of care:   Short-term care   Durenda Age, DNP, MSN, FNP-BC [facility] [phone] (Monday-Friday 8:00 a.m. - 5:00 p.m.) [phone] (after hours)

## 2020-08-18 ENCOUNTER — Encounter: Payer: Self-pay | Admitting: Internal Medicine

## 2020-08-21 ENCOUNTER — Other Ambulatory Visit: Payer: Self-pay | Admitting: Adult Health

## 2020-08-21 MED ORDER — HYDROCODONE-ACETAMINOPHEN 5-325 MG PO TABS: 2.0000 | ORAL_TABLET | Freq: Four times a day (QID) | ORAL | 0 refills | Status: DC | PRN

## 2020-08-24 ENCOUNTER — Non-Acute Institutional Stay (SKILLED_NURSING_FACILITY): Payer: Medicare PPO | Admitting: Adult Health

## 2020-08-24 ENCOUNTER — Encounter: Payer: Self-pay | Admitting: Adult Health

## 2020-08-24 DIAGNOSIS — D62 Acute posthemorrhagic anemia: Secondary | ICD-10-CM

## 2020-08-24 DIAGNOSIS — N1832 Chronic kidney disease, stage 3b: Secondary | ICD-10-CM

## 2020-08-24 DIAGNOSIS — S22079G Unspecified fracture of T9-T10 vertebra, subsequent encounter for fracture with delayed healing: Secondary | ICD-10-CM | POA: Diagnosis not present

## 2020-08-24 DIAGNOSIS — E1122 Type 2 diabetes mellitus with diabetic chronic kidney disease: Secondary | ICD-10-CM | POA: Diagnosis not present

## 2020-08-24 DIAGNOSIS — I5032 Chronic diastolic (congestive) heart failure: Secondary | ICD-10-CM

## 2020-08-24 NOTE — Progress Notes (Signed)
Location:  [place] Room Number: 116-A Place of Service:  SNF (31) Provider:  Durenda Age, DNP, FNP-BC  Patient Care Team: Bernerd Limbo, MD as PCP - General (Family Medicine)  Extended Emergency Contact Information Primary Emergency Contact: Stankovic,Ronald Address: [address]          [place] Phone: [phone] Mobile Phone: [phone] Relation: Spouse Secondary Emergency Contact: Enrique Sack Mobile Phone: [phone] Relation: Son Interpreter needed? No  Code Status:  FULL CODE  Goals of care: Advanced Directive information Advanced Directives 08/24/2020  Does Patient Have a Medical Advance Directive? No  Would patient like information on creating a medical advance directive? No - Patient declined  Pre-existing out of facility DNR order (yellow form or pink MOST form) -     Chief Complaint  Patient presents with  . Acute Visit     For possible discharge    HPI:  Pt is a 75 y.o. female who is for possible discharge to [facility]. She has a surgical wound at her midback that was noted to have slough and eschar. She followed up with surgeon yesterday and was told that she will start with antibiotic. [facility] representative was at bedside. CBGs ranging from 72 to 208, with outlier 256. She takes Januvia 50 mg daily for diabetes mellitus.  She was admitted to [facility] on 08/08/20 post hospital admission 07/31/20 to 08/08/20 S/P fall fall at home sustaining unstable T10 vertebral fracture.  She underwent T8-11 stabilization of her unstable T10 fracture on 08/01/2020. She was discharged with a TLSO brace. She was admitted to [facility] for a short-term rehabilitation.   Past Medical History:  Diagnosis Date  . Allergy   . Anemia   . Arthritis   . Cataract    biltaerally   . CKD (chronic kidney disease) stage 3, GFR 30-59 ml/min (HCC) 08/29/2011   per  patient, kidneys monitored by Dr Posey Pronto endocrinologist ,lov was  05-18-2018, "kidney fx stable"  . Colon polyp   . Complication of anesthesia   . Difficult intubation    small mouth opening  . Diverticulosis   . Gout   . HTN (hypertension) 08/29/2011  . Hyperlipemia   . Psoriasis   . Type II or unspecified type diabetes mellitus without mention of complication, not stated as uncontrolled 08/29/2011   Past Surgical History:  Procedure Laterality Date  . BREAST LUMPECTOMY WITH NEEDLE LOCALIZATION Right 05/07/2012   Procedure: BREAST LUMPECTOMY WITH NEEDLE LOCALIZATION;  Surgeon: Merrie Roof, MD;  Location: [place];  Service: General;  Laterality: Right;  . CHOLECYSTECTOMY    . COLONOSCOPY  2009   Ardis Hughs   . COLONOSCOPY WITH PROPOFOL N/A 05/20/2018   Procedure: COLONOSCOPY WITH PROPOFOL;  Surgeon: Milus Banister, MD;  Location: [facility];  Service: Endoscopy;  Laterality: N/A;  . HERNIA REPAIR    . HIP SURGERY    . LAMINECTOMY WITH POSTERIOR LATERAL ARTHRODESIS LEVEL 4 N/A 08/01/2020   Procedure: LAMINECTOMY WITH POSTERIOR LATERAL ARTHRODESIS Thoracic nine- Thoracic eleven ;  Surgeon: Consuella Lose, MD;  Location: [place];  Service: Neurosurgery;  Laterality: N/A;  . POLYPECTOMY  05/20/2018   Procedure: POLYPECTOMY;  Surgeon: Milus Banister, MD;  Location: [facility];  Service: Endoscopy;;  . RHINOPLASTY    . SALPINGOOPHORECTOMY    . TOTAL HIP ARTHROPLASTY Right 2004    Allergies  Allergen Reactions  . Codeine Itching  . Iodinated Diagnostic Agents  Other reaction(s): Unknown  . Pentazocine Other (See Comments)    Hallucinations  . Statins     Outpatient Encounter Medications as of 08/24/2020  Medication Sig  . allopurinol (ZYLOPRIM) 300 MG tablet Take 150 mg by mouth daily. 1/2 Tablet  . atenolol (TENORMIN) 100 MG tablet Take 100 mg by mouth daily.  Marland Kitchen b complex vitamins tablet Take 1 tablet by mouth daily.  . benzonatate (TESSALON) 100 MG capsule Take 100 mg by  mouth every 8 (eight) hours as needed for cough.  . bisacodyl (DULCOLAX) 10 MG suppository Place 10 mg rectally as needed for moderate constipation.  . cloNIDine (CATAPRES) 0.2 MG tablet Take 1 tablet (0.2 mg total) by mouth 2 (two) times daily.  . ferrous sulfate 325 (65 FE) MG tablet Take 325 mg by mouth daily with breakfast.  . furosemide (LASIX) 40 MG tablet Take 40 mg by mouth daily.   Marland Kitchen HYDROcodone-acetaminophen (NORCO/VICODIN) 5-325 MG tablet Take 2 tablets by mouth every 6 (six) hours as needed for moderate pain or severe pain.  Marland Kitchen loratadine (CLARITIN) 10 MG tablet Take 10 mg by mouth daily.  . Magnesium Hydroxide (MILK OF MAGNESIA PO) Take 30 mLs by mouth as needed.  . mometasone (ELOCON) 0.1 % cream Apply 1 application topically daily as needed (psoriasis).  . Omega-3 1000 MG CAPS Take 1,000 mg by mouth 2 (two) times daily.  . ondansetron (ZOFRAN) 4 MG tablet Take 4 mg by mouth every 8 (eight) hours as needed for nausea or vomiting.  . polyethylene glycol (MIRALAX / GLYCOLAX) 17 g packet Take 17 g by mouth daily.  . sitaGLIPtin (JANUVIA) 50 MG tablet Take 50 mg by mouth daily.   . Sodium Phosphates (RA SALINE ENEMA RE) Place rectally as needed.  . [DISCONTINUED] benzonatate (TESSALON) 100 MG capsule Take 1 capsule (100 mg total) by mouth 3 (three) times daily.  . [DISCONTINUED] glipiZIDE (GLUCOTROL XL) 10 MG 24 hr tablet Take 10 mg by mouth daily with breakfast.   No facility-administered encounter medications on file as of 08/24/2020.    Review of Systems  GENERAL: No change in appetite, no fatigue, no weight changes, no fever or chills  MOUTH and THROAT: Denies oral discomfort, gingival pain or bleeding, pain from teeth or hoarseness   RESPIRATORY: no cough, SOB, DOE, wheezing, hemoptysis CARDIAC: No chest pain or palpitations GI: No abdominal pain, diarrhea, constipation, heart burn, nausea or vomiting GU: Denies dysuria, frequency, hematuria, incontinence, or  discharge NEUROLOGICAL: Denies dizziness, syncope, numbness, or headache PSYCHIATRIC: Denies feelings of depression or anxiety. No report of hallucinations, insomnia, paranoia, or agitation   Immunization History  Administered Date(s) Administered  . Fluad Quad(high Dose 65+) 12/03/2018  . Influenza Split 12/22/2008, 11/30/2009, 12/13/2010, 12/25/2011  . Influenza, High Dose Seasonal PF 01/11/2015, 11/23/2017, 12/03/2018, 12/13/2019  . Influenza, Seasonal, Injecte, Preservative Fre 12/16/2013  . Influenza,inj,Quad PF,6+ Mos 12/11/2015, 11/14/2016  . Influenza,inj,Quad PF,6-35 Mos 12/11/2015, 11/14/2016  . Influenza,inj,quad, With Preservative 12/16/2013  . PFIZER(Purple Top)SARS-COV-2 Vaccination 05/01/2019, 05/26/2019, 11/22/2019, 07/17/2020  . Pneumococcal Conjugate-13 01/16/2012  . Pneumococcal Polysaccharide-23 03/24/2005, 08/08/2014  . Tdap 09/13/2010   Pertinent  Health Maintenance Due  Topic Date Due  . FOOT EXAM  Never done  . OPHTHALMOLOGY EXAM  Never done  . MAMMOGRAM  Never done  . DEXA SCAN  Never done  . INFLUENZA VACCINE  10/22/2020  . HEMOGLOBIN A1C  02/01/2021  . COLONOSCOPY (Pts 45-14yrs Insurance coverage will need to be confirmed)  05/20/2028  . PNA vac  Low Risk Adult  Completed   No flowsheet data found.   Vitals:   08/24/20 1125  BP: 129/61  Pulse: 69  Resp: 16  Temp: (!) 94.6 F (34.8 C)  Weight: 162 lb 6.4 oz (73.7 kg)  Height: 5\' 3"  (1.6 m)   Body mass index is 28.77 kg/m.  Physical Exam  GENERAL APPEARANCE: Well nourished. In no acute distress. Normal body habitus SKIN:  See HPI MOUTH and THROAT: Lips are without lesions. Oral mucosa is moist and without lesions. Tongue is normal in shape, size, and color and without lesions RESPIRATORY: Breathing is even & unlabored, BS CTAB CARDIAC: RRR, no murmur,no extra heart sounds, BLE 2+ edema GI: Abdomen soft, normal BS, no masses, no tenderness NEUROLOGICAL: There is no tremor. Speech is clear.  Alert and oriented X 3. PSYCHIATRIC:  Affect and behavior are appropriate  Labs reviewed: Recent Labs    08/01/20 0559 08/01/20 1226 08/03/20 0804 08/04/20 0425 08/06/20 6644 08/07/20 0208 08/08/20 0056  NA 137   < > 139   < > 141 139 137  K 5.6*   < > 4.2   < > 3.8 3.7 3.5  CL 107   < > 111   < > 109 107 106  CO2 22   < > 20*   < > 24 25 26   GLUCOSE 194*   < > 76   < > 100* 96 113*  BUN 68*   < > 72*   < > 62* 68* 62*  CREATININE 2.68*   < > 2.04*   < > 1.68* 1.76* 1.53*  CALCIUM 8.7*   < > 7.7*   < > 8.3* 8.3* 8.5*  MG 1.9  --  2.0  --   --   --   --   PHOS 6.4*  --   --   --   --   --   --    < > = values in this interval not displayed.   Recent Labs    08/01/20 0559 08/03/20 1159  AST 21  --   ALT 15  --   ALKPHOS 86  --   BILITOT 0.7  --   PROT 6.4*  --   ALBUMIN 2.7* 2.4*   Recent Labs    08/06/20 0652 08/07/20 0208 08/08/20 0056  WBC 9.8 10.5 12.4*  NEUTROABS 7.2 7.2 9.3*  HGB 8.8* 8.5* 8.5*  HCT 27.3* 26.6* 26.9*  MCV 87.8 87.2 87.9  PLT 204 204 237   Lab Results  Component Value Date   TSH 1.188 08/01/2020   Lab Results  Component Value Date   HGBA1C 5.4 08/01/2020   Lab Results  Component Value Date   CHOL 116 08/31/2011   HDL 21 (L) 08/31/2011   LDLCALC 35 08/31/2011   TRIG 221 (H) 08/06/2020   CHOLHDL 5.5 08/31/2011    Significant Diagnostic Results in last 30 days:  CT ABDOMEN PELVIS WO CONTRAST  Result Date: 08/02/2020 CLINICAL DATA:  Nausea and vomiting. EXAM: CT ABDOMEN AND PELVIS WITHOUT CONTRAST TECHNIQUE: Multidetector CT imaging of the abdomen and pelvis was performed following the standard protocol without IV contrast. COMPARISON:  X-ray abdomen 08/01/2020 FINDINGS: Lower chest: Interval increase in size of a heterogeneous moderate to large volume right pleural effusion. Associated complete collapse of the right lower lobe. Trace left pleural effusion. Hepatobiliary: No focal liver abnormality is seen. Status post  cholecystectomy. No biliary dilatation. Pancreas: No focal lesion. Normal pancreatic contour. No surrounding inflammatory changes.  No main pancreatic ductal dilatation. Spleen: Normal in size without focal abnormality. Adrenals/Urinary Tract: No adrenal nodule bilaterally. Nonspecific bilateral perinephric stranding. Bilateral renal calcifications measuring up to 5 mm. No hydronephrosis. Exophytic 1.7 cm fluid density lesion within the right kidney likely represents a simple renal cyst. Otherwise no contour-deforming renal mass. No ureterolithiasis or hydroureter. Limited evaluation of distal ureters due to streak artifact originating from the right femoral surgical hardware. The urinary bladder is decompressed with a Foley catheter terminating within its lumen. There is limited evaluation of the urinary bladder due to streak artifact originating from the right surgical femoral hardware. Stomach/Bowel: Enteric tube coursing within the gastric lumen with side port just distal to the gastroesophageal junction. Stomach is within normal limits. No evidence of bowel wall thickening or dilatation. Scattered colonic diverticulosis. Appendix appears normal. Vascular/Lymphatic: No abdominal aorta or iliac aneurysm. Moderate atherosclerotic plaque of the aorta and its branches. No abdominal, pelvic, or inguinal lymphadenopathy. Reproductive: Uterus and bilateral adnexa are unremarkable. Other: No intraperitoneal free fluid. No intraperitoneal free gas. No organized fluid collection. Musculoskeletal: Anterior abdominal hernia repair with mesh. Interval placement of partially visualized posterolateral thoracic fusion surgical hardware in a patient with T10-T11 acute vertebral body fracture with extension to the posterior elements. Slight angulation of the left T11 (3:12) surgical hardware with no violation of the spinal canal or findings to suggest impingement on the nerve roots. Overlying skin staples and subcutaneus soft  tissue edema. Multilevel severe degenerative changes of the spine with fusion of the L3 through L5 vertebral bodies leading to straightening of the normal lumbar lordosis. Mild retrolisthesis of L5 on S1. No suspicious lytic or blastic osseous lesions. Total right hip arthroplasty.  Degenerative changes of the left hip. IMPRESSION: 1. Interval increase in size of a heterogeneous moderate to large volume right pleural effusion. Areas of increased density within the pleural fluid may represent hemothorax, collapsed right lower lobe lung, or less likely a mass lesion. 2. Interval development of trace left pleural effusion. 3. Nonspecific bilateral perinephric stranding. Consider correlation with urinalysis for infection. 4. Bilateral nonobstructive nephrolithiasis measuring up to 5 mm. 5. Scattered colonic diverticulosis with no acute diverticulitis. 6. Known T10-T11 fracture with interval placement of partially visualized posterolateral thoracic fusion surgical hardware. 7. Severe degenerative changes of the lumbar spine with L3 through L5 vertebral body fusion. 8.  Aortic Atherosclerosis (ICD10-I70.0). Electronically Signed   By: Iven Finn M.D.   On: 08/02/2020 00:09   DG Ribs Bilateral W/Chest  Result Date: 07/31/2020 CLINICAL DATA:  Fall.  Pain. EXAM: BILATERAL RIBS AND CHEST - 4+ VIEW COMPARISON:  Remote chest radiograph 05/03/2012. No interval chest imaging. FINDINGS: No fracture or other bone lesions are seen involving the ribs. Right pleural effusion at least moderate in size. No pneumothorax. Heart size and mediastinal contours are within normal limits. Aortic atherosclerosis. IMPRESSION: 1. No evidence of rib fracture. 2. Right pleural effusion at least moderate in size. This is of unknown acuity. Electronically Signed   By: Keith Rake M.D.   On: 07/31/2020 20:39   DG Thoracic Spine 2 View  Result Date: 07/31/2020 CLINICAL DATA:  Unwitnessed fall.  Pain. EXAM: THORACIC SPINE 2 VIEWS  COMPARISON:  None. FINDINGS: Broad-based dextroscoliotic curvature. No evidence of acute fracture or compression deformity. Flowing anterior osteophytes throughout the thoracic spine. Minimal disc space narrowing at T10-T11 and T11-T12. no paravertebral soft tissue abnormality IMPRESSION: 1. No radiographic evidence of acute fracture of the thoracic spine. 2. Scoliosis and degenerative change. Electronically Signed  By: Keith Rake M.D.   On: 07/31/2020 20:36   DG THORACOLUMABAR SPINE  Result Date: 08/01/2020 CLINICAL DATA:  T8 through 11 fusion. EXAM: THORACOLUMBAR SPINE 1V COMPARISON:  Preoperative imaging. FINDINGS: Two fluoroscopic spot views of the thoracic spine obtained in frontal and lateral projections. Pedicle rods in the lower thoracic spine, levels difficult to accurately delineate on coned view. Total fluoroscopy time 20 seconds. Total dose 7.78 mGy. IMPRESSION: Intraoperative fluoroscopy during thoracic fusion. Electronically Signed   By: Keith Rake M.D.   On: 08/01/2020 16:16   DG Lumbar Spine Complete  Result Date: 07/31/2020 CLINICAL DATA:  Unwitnessed fall.  Pain. EXAM: LUMBAR SPINE - COMPLETE 4+ VIEW COMPARISON:  None. FINDINGS: Five non-rib-bearing lumbar vertebra. Straightening of normal lordosis. Near complete disc space loss at L4-L5 with prominent disc space narrowing at L3-L4 and L5-S1. Endplate spurring with lesser disc space narrowing at L1-L2 and L2-L3. Multilevel facet hypertrophy. Vertebral body heights are preserved. No evidence of fracture. Degenerative change of both sacroiliac joints. IMPRESSION: 1. No fracture of the lumbar spine. 2. Straightening of normal lordosis may be positioning or muscle spasm. Multilevel degenerative disc disease and facet hypertrophy. Electronically Signed   By: Keith Rake M.D.   On: 07/31/2020 20:37   DG Forearm Left  Result Date: 08/01/2020 CLINICAL DATA:  Fall yesterday with swelling. EXAM: LEFT FOREARM - 2 VIEW COMPARISON:   None. FINDINGS: Advanced degenerative changes involving the carpal bones and radiocarpal articulation. There is also more mild to moderate degenerative change about the elbow. No acute fracture or dislocation. No elbow joint effusion. Suboptimal patient positioning secondary to clinical status. Possible soft tissue swelling adjacent the proximal ulna. IMPRESSION: Advanced degenerative change, without acute osseous abnormality. Electronically Signed   By: Abigail Miyamoto M.D.   On: 08/01/2020 13:48   CT Head Wo Contrast  Result Date: 07/31/2020 CLINICAL DATA:  Recent fall from chair, initial encounter EXAM: CT HEAD WITHOUT CONTRAST CT CERVICAL SPINE WITHOUT CONTRAST TECHNIQUE: Multidetector CT imaging of the head and cervical spine was performed following the standard protocol without intravenous contrast. Multiplanar CT image reconstructions of the cervical spine were also generated. COMPARISON:  None. FINDINGS: CT HEAD FINDINGS Brain: No evidence of acute infarction, hemorrhage, hydrocephalus, extra-axial collection or mass lesion/mass effect. Calcification is noted in the midportion of the pons. Mild chronic white matter ischemic changes are noted. Vascular: No hyperdense vessel or unexpected calcification. Skull: Normal. Negative for fracture or focal lesion. Sinuses/Orbits: No acute finding. Other: Mild parietal scalp hematoma is noted in the vertex on the left. CT CERVICAL SPINE FINDINGS Alignment: Mild loss of normal cervical lordosis is noted likely related to muscular spasm Skull base and vertebrae: 7 cervical segments are well visualized. Vertebral body height is well maintained. Multilevel osteophytic changes and facet hypertrophic changes are noted. No acute fracture or acute facet abnormality is seen. Soft tissues and spinal canal: Surrounding soft tissue structures show vascular calcifications. Enlargement of the right lobe of the thyroid is noted with evidence of calcifications and a nodule which  measures at least 2.5 cm. No other soft tissue abnormality is noted. Upper chest: Visualized lung apices demonstrate evidence of a right-sided pleural effusion. This is of uncertain chronicity. No rib abnormality is noted. Other: None IMPRESSION: CT of the head: Left parietal scalp hematoma. No acute intracranial abnormality is noted. CT of the cervical spine: Multilevel degenerative changes without acute bony abnormality. Right-sided pleural effusion. Prominent right lobe of the thyroid with at least 1 nodule within. Recommend  nonemergent thyroid US (ref: J Am Coll Radiol. 2015 Feb;12(2): 143-50). Electronically Signed   By: Inez Catalina M.D.   On: 07/31/2020 18:10   CT Chest Wo Contrast  Result Date: 07/31/2020 CLINICAL DATA:  Fall.  Chest trauma. EXAM: CT CHEST WITHOUT CONTRAST TECHNIQUE: Multidetector CT imaging of the chest was performed following the standard protocol without IV contrast. COMPARISON:  Ribbon thoracic spine radiographs earlier today. FINDINGS: Cardiovascular: Moderate aortic atherosclerosis. No periaortic stranding to suggest injury. There are coronary artery calcifications. Heart is normal in size, slightly displaced into the left chest. No pericardial effusion. Mediastinum/Nodes: Scattered small mediastinal lymph nodes, not enlarged by size criteria. No esophageal wall thickening. Heterogeneously enlarged right lobe of the thyroid gland with scattered curvilinear calcifications. Included left thyroid lobe is normal. Lungs/Pleura: Moderate to large right pleural effusion, heterogeneous density. Areas of increased density within the pleural fluid may represent hemorrhage, collapsed lung or less likely masses. Small amount of pleural fluid appears loculated and tracks anteromedially. No pneumothorax. Left lung is clear. No left pleural effusion. Upper Abdomen: No definite free fluid in the upper abdomen. Diverticulosis at the splenic flexure of the colon. Cholecystectomy. Musculoskeletal:  Bones are diffusely under mineralized. Diffuse thoracic ankylosis. Displaced fracture through T10 vertebral body involves the anterior third, extend into the T9-T10 disc space, and is displaced anteriorly. Fracture extends through anterior osteophytes as well as the anterior superior aspect of T11 vertebral body. No convincing extension to involve the posterior elements. There is adjacent paravertebral hemorrhage and small volume of gas, likely related to escaped vacuum phenomenon. No additional thoracic spine fracture. Diffuse ankylosis with flowing anterior osteophytes. No acute fracture of the ribs, included clavicles or shoulder girdles. No sternal fracture. IMPRESSION: 1. Extension injury of the thoracic spine with displaced T10 vertebral body fracture extending into the T9-T10 disc space and anterior vertebral body. Fracture extends through anterior osteophytes as well as the anterior superior aspect of T11 vertebral body. No convincing extension to involve the posterior elements. There is adjacent paravertebral hemorrhage and small volume of gas, likely related to escaped vacuum phenomenon. Recommend spine consultation. 2. Moderate to large right pleural effusion, heterogeneous density. Areas of increased density within the pleural fluid may represent hemorrhage, collapsed lung or less likely masses. Recommend continued radiographic follow-up to ensure resolution. No pneumothorax or rib fractures. 3. Heterogeneously enlarged right lobe of the thyroid gland with scattered curvilinear calcifications. Recommend thyroid US (ref: J Am Coll Radiol. 2015 Feb;12(2): 143-50). Aortic Atherosclerosis (ICD10-I70.0). Electronically Signed   By: Keith Rake M.D.   On: 07/31/2020 22:29   CT Cervical Spine Wo Contrast  Result Date: 07/31/2020 CLINICAL DATA:  Recent fall from chair, initial encounter EXAM: CT HEAD WITHOUT CONTRAST CT CERVICAL SPINE WITHOUT CONTRAST TECHNIQUE: Multidetector CT imaging of the head and  cervical spine was performed following the standard protocol without intravenous contrast. Multiplanar CT image reconstructions of the cervical spine were also generated. COMPARISON:  None. FINDINGS: CT HEAD FINDINGS Brain: No evidence of acute infarction, hemorrhage, hydrocephalus, extra-axial collection or mass lesion/mass effect. Calcification is noted in the midportion of the pons. Mild chronic white matter ischemic changes are noted. Vascular: No hyperdense vessel or unexpected calcification. Skull: Normal. Negative for fracture or focal lesion. Sinuses/Orbits: No acute finding. Other: Mild parietal scalp hematoma is noted in the vertex on the left. CT CERVICAL SPINE FINDINGS Alignment: Mild loss of normal cervical lordosis is noted likely related to muscular spasm Skull base and vertebrae: 7 cervical segments are well visualized. Vertebral  body height is well maintained. Multilevel osteophytic changes and facet hypertrophic changes are noted. No acute fracture or acute facet abnormality is seen. Soft tissues and spinal canal: Surrounding soft tissue structures show vascular calcifications. Enlargement of the right lobe of the thyroid is noted with evidence of calcifications and a nodule which measures at least 2.5 cm. No other soft tissue abnormality is noted. Upper chest: Visualized lung apices demonstrate evidence of a right-sided pleural effusion. This is of uncertain chronicity. No rib abnormality is noted. Other: None IMPRESSION: CT of the head: Left parietal scalp hematoma. No acute intracranial abnormality is noted. CT of the cervical spine: Multilevel degenerative changes without acute bony abnormality. Right-sided pleural effusion. Prominent right lobe of the thyroid with at least 1 nodule within. Recommend nonemergent thyroid US (ref: J Am Coll Radiol. 2015 Feb;12(2): 143-50). Electronically Signed   By: Inez Catalina M.D.   On: 07/31/2020 18:10   MR BRAIN WO CONTRAST  Result Date:  08/04/2020 CLINICAL DATA:  Seizure with abnormal mental status EXAM: MRI HEAD WITHOUT CONTRAST TECHNIQUE: Multiplanar, multiecho pulse sequences of the brain and surrounding structures were obtained without intravenous contrast. COMPARISON:  CT and CTA from 2 days ago FINDINGS: Brain: No acute infarction, hemorrhage, hydrocephalus, extra-axial collection or mass lesion. Extensive low-density in the cerebral white matter attributed to chronic small vessel ischemia. There is both gliosis and scattered white matter lacunes. Cystic changes at the bilateral globus pallidus which may reflect remote insult. Preserved brain volume for age. Vascular: Preserved flow voids Skull and upper cervical spine: Normal marrow signal. 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 Artifact in the scalp where there are skin staples by prior CT. Sinuses/Orbits: No acute finding Other: Truncated and motion degraded study. The patient was stopped early due to hypoxia. IMPRESSION: 1. No acute finding. 2. Extensive chronic small vessel disease. 3. Motion degraded and truncated study due to patient condition. Electronically Signed   By: Monte Fantasia M.D.   On: 08/04/2020 09:58   DG Chest Port 1 View  Result Date: 08/04/2020 CLINICAL DATA:  Evaluate respiratory distress EXAM: PORTABLE CHEST 1 VIEW COMPARISON:  Yesterday FINDINGS: Cardiomegaly, indistinct hila, and diffuse interstitial and hazy airspace opacity. There are likely small pleural effusions. No pneumothorax. Postoperative thoracic spine. IMPRESSION: CHF. Electronically Signed   By: Monte Fantasia M.D.   On: 08/04/2020 10:42   DG CHEST PORT 1 VIEW  Result Date: 08/03/2020 CLINICAL DATA:  Status post right thoracentesis EXAM: PORTABLE CHEST 1 VIEW COMPARISON:  08/02/2020 FINDINGS: Improvement in the right effusion following thoracentesis. There is better aeration in the right lower lung. Negative for pneumothorax. Residual bibasilar atelectasis and left lower lobe  airspace opacity may represent pneumonia. Heart is enlarged. Postop changes from lower thoracic fusion evident. NG tube extends below the hemidiaphragms with the tip not visualized as it enters the stomach. Endotracheal tube 3 cm above the carina. IMPRESSION: Improvement in the right effusion following thoracentesis. Negative for pneumothorax. Otherwise stable portable chest exam. Electronically Signed   By: Jerilynn Mages.  Shick M.D.   On: 08/03/2020 13:32   DG CHEST PORT 1 VIEW  Result Date: 08/02/2020 CLINICAL DATA:  Intubation, ventilator dependence EXAM: PORTABLE CHEST 1 VIEW COMPARISON:  Portable exam 1557 hours compared to 07/31/2020 FINDINGS: Tip of endotracheal tube projects 1.9 cm above carina. Nasogastric tube extends into abdomen. Normal heart size, mediastinal contours, and pulmonary vascularity. Diffuse pulmonary infiltrates greater on LEFT, question asymmetric edema versus pneumonia. Persistent RIGHT pleural effusion. No pneumothorax or acute osseous findings.  Bones demineralized with prior thoracic spinal fixation. IMPRESSION: Increased pulmonary infiltrates question edema versus infection. Persistent RIGHT pleural effusion. Electronically Signed   By: Lavonia Dana M.D.   On: 08/02/2020 17:19   DG Abd Portable 1V  Result Date: 08/01/2020 CLINICAL DATA:  NG tube placement EXAM: PORTABLE ABDOMEN - 1 VIEW COMPARISON:  None FINDINGS: Partially visualized thoracic spinal hardware. Esophageal tube tip and side port overlie the mid stomach. Gas pattern unremarkable IMPRESSION: Esophageal tube tip and side port overlie the mid stomach. Electronically Signed   By: Donavan Foil M.D.   On: 08/01/2020 17:21   EEG adult  Result Date: 08/02/2020 Lora Havens, MD     08/02/2020  9:08 PM Patient Name: FAUNA NEUNER MRN: [id number] Epilepsy Attending: Lora Havens Referring Physician/Provider: Dr Donnetta Simpers Date: 08/02/2020 Duration: 24.40 mins Patient history: 75 y.o. female with PMH significant for  CKD, HTN, Gout, HTN, DM2, HLd who is admitted with T spine fx s/p fall who had sudden unresponsiveness with stiffening of BL upper extremities and starring off. EEG to evaluate for seizure Level of alertness:  comatose AEDs during EEG study: LEV Technical aspects: This EEG study was done with scalp electrodes positioned according to the 10-20 International system of electrode placement. Electrical activity was acquired at a sampling rate of 500Hz  and reviewed with a high frequency filter of 70Hz  and a low frequency filter of 1Hz . EEG data were recorded continuously and digitally stored. Description: EEG showed continuous generalized 3 to 6 Hz theta-delta slowing admixed with 15-18hz  fronto-central beta activity. Hyperventilation and photic stimulation were not performed.   ABNORMALITY - Continuous slow, generalized IMPRESSION: This study is suggestive of moderate diffuse encephalopathy, nonspecific etiology but likely related to sedation. No seizures or epileptiform discharges were seen throughout the recording. Priyanka Barbra Sarks   Overnight EEG with video  Result Date: 08/03/2020 Lora Havens, MD     08/03/2020 11:20 AM Patient Name: NILAYA BOUIE MRN: [id number] Epilepsy Attending: Lora Havens Referring Physician/Provider: Dr Donnetta Simpers Duration: 08/02/2020 1809 to 08/03/2020 1004  Patient history: 75 y.o.femalewith PMH significant for CKD, HTN, Gout, HTN, DM2, HLd who is admitted with T spine fx s/p fall who had sudden unresponsiveness with stiffening of BL upper extremities and starring off. EEG to evaluate for seizure  Level of alertness:  awake, asleep  AEDs during EEG study: LEV, Propofol  Technical aspects: This EEG study was done with scalp electrodes positioned according to the 10-20 International system of electrode placement. Electrical activity was acquired at a sampling rate of 500Hz  and reviewed with a high frequency filter of 70Hz  and a low frequency filter of 1Hz . EEG data were  recorded continuously and digitally stored.  Description: The posterior dominant rhythm consists of 9 Hz activity of moderate voltage (25-35 uV) seen predominantly in posterior head regions, symmetric and reactive to eye opening and eye closing. Sleep was characterized by vertex waves, sleep spindles (12 to 14 Hz), maximal frontocentral region. EEG showed continuous generalized 3 to 6 Hz theta-delta slowing admixed with 15-18hz  fronto-central beta activity. Hyperventilation and photic stimulation were not performed.    ABNORMALITY - Continuous slow, generalized  IMPRESSION: This study is suggestive of moderate diffuse encephalopathy, nonspecific etiology but likely related to sedation. No seizures or epileptiform discharges were seen throughout the recording.  Lora Havens   DG C-Arm [id number] Min  Result Date: 08/01/2020 CLINICAL DATA:  T8 through 11 fusion. EXAM: THORACOLUMBAR SPINE 1V COMPARISON:  Preoperative imaging. FINDINGS:  Two fluoroscopic spot views of the thoracic spine obtained in frontal and lateral projections. Pedicle rods in the lower thoracic spine, levels difficult to accurately delineate on coned view. Total fluoroscopy time 20 seconds. Total dose 7.78 mGy. IMPRESSION: Intraoperative fluoroscopy during thoracic fusion. Electronically Signed   By: Keith Rake M.D.   On: 08/01/2020 16:16   DG C-Arm 1-60 Min-No Report  Result Date: 08/07/2020 Fluoroscopy was utilized by the requesting physician.  No radiographic interpretation.   ECHOCARDIOGRAM COMPLETE  Result Date: 08/03/2020    ECHOCARDIOGRAM REPORT   Patient Name:   ANAPAOLA KINSEL Date of Exam: 08/03/2020 Medical Rec #:  [id number]     Height:       63.0 in Accession #:    [id number]    Weight:       186.0 lb Date of Birth:  02-May-1945      BSA:          1.875 m Patient Age:    51 years      BP:           95/54 mmHg Patient Gender: F             HR:           89 bpm. Exam Location:  Inpatient Procedure: 2D Echo, Cardiac Doppler  and Color Doppler Indications:    Pleural effusion  History:        Patient has no prior history of Echocardiogram examinations.                 Risk Factors:Diabetes, Hypertension and Dyslipidemia.  Sonographer:    Luisa Hart RDCS Referring Phys: [place]  1. Left ventricular ejection fraction, by estimation, is 55 to 60%. The left ventricle has normal function. The left ventricle has no regional wall motion abnormalities. Left ventricular diastolic parameters are consistent with Grade I diastolic dysfunction (impaired relaxation).  2. Right ventricular systolic function is normal. The right ventricular size is normal. There is normal pulmonary artery systolic pressure. The estimated right ventricular systolic pressure is 10.2 mmHg.  3. The mitral valve is normal in structure. Trivial mitral valve regurgitation. No evidence of mitral stenosis.  4. The aortic valve is tricuspid. Aortic valve regurgitation is not visualized. Mild aortic valve sclerosis is present, with no evidence of aortic valve stenosis.  5. The inferior vena cava is normal in size with greater than 50% respiratory variability, suggesting right atrial pressure of 3 mmHg. FINDINGS  Left Ventricle: Left ventricular ejection fraction, by estimation, is 55 to 60%. The left ventricle has normal function. The left ventricle has no regional wall motion abnormalities. The left ventricular internal cavity size was normal in size. There is  no left ventricular hypertrophy. Left ventricular diastolic parameters are consistent with Grade I diastolic dysfunction (impaired relaxation). Right Ventricle: The right ventricular size is normal. No increase in right ventricular wall thickness. Right ventricular systolic function is normal. There is normal pulmonary artery systolic pressure. The tricuspid regurgitant velocity is 2.69 m/s, and  with an assumed right atrial pressure of 3 mmHg, the estimated right ventricular systolic pressure  is 72.5 mmHg. Left Atrium: Left atrial size was normal in size. Right Atrium: Right atrial size was normal in size. Pericardium: Trivial pericardial effusion is present. Mitral Valve: The mitral valve is normal in structure. Mild mitral annular calcification. Trivial mitral valve regurgitation. No evidence of mitral valve stenosis. Tricuspid Valve: The tricuspid valve is normal in structure. Tricuspid valve regurgitation  is trivial. Aortic Valve: The aortic valve is tricuspid. Aortic valve regurgitation is not visualized. Mild aortic valve sclerosis is present, with no evidence of aortic valve stenosis. Aortic valve mean gradient measures 3.0 mmHg. Aortic valve peak gradient measures 6.2 mmHg. Aortic valve area, by VTI measures 2.57 cm. Pulmonic Valve: The pulmonic valve was normal in structure. Pulmonic valve regurgitation is trivial. Aorta: The aortic root is normal in size and structure. Venous: The inferior vena cava is normal in size with greater than 50% respiratory variability, suggesting right atrial pressure of 3 mmHg. IAS/Shunts: No atrial level shunt detected by color flow Doppler.  LEFT VENTRICLE PLAX 2D LVIDd:         5.70 cm     Diastology LVIDs:         2.90 cm     LV e' medial:    5.87 cm/s LV PW:         1.00 cm     LV E/e' medial:  20.4 LV IVS:        0.80 cm     LV e' lateral:   6.53 cm/s LVOT diam:     2.00 cm     LV E/e' lateral: 18.4 LV SV:         68 LV SV Index:   36 LVOT Area:     3.14 cm  LV Volumes (MOD) LV vol d, MOD A2C: 55.0 ml LV vol d, MOD A4C: 68.9 ml LV vol s, MOD A2C: 22.4 ml LV vol s, MOD A4C: 26.1 ml LV SV MOD A2C:     32.6 ml LV SV MOD A4C:     68.9 ml LV SV MOD BP:      40.3 ml RIGHT VENTRICLE RV S prime:     11.00 cm/s TAPSE (M-mode): 2.6 cm LEFT ATRIUM           Index       RIGHT ATRIUM           Index LA diam:      4.00 cm 2.13 cm/m  RA Area:     11.70 cm LA Vol (A4C): 41.4 ml 22.08 ml/m RA Volume:   25.60 ml  13.65 ml/m  AORTIC VALVE                   PULMONIC VALVE  AV Area (Vmax):    2.30 cm    PV Vmax:       1.06 m/s AV Area (Vmean):   2.52 cm    PV Vmean:      80.000 cm/s AV Area (VTI):     2.57 cm    PV VTI:        0.210 m AV Vmax:           125.00 cm/s PV Peak grad:  4.5 mmHg AV Vmean:          84.600 cm/s PV Mean grad:  3.0 mmHg AV VTI:            0.264 m AV Peak Grad:      6.2 mmHg AV Mean Grad:      3.0 mmHg LVOT Vmax:         91.70 cm/s LVOT Vmean:        67.800 cm/s LVOT VTI:          0.216 m LVOT/AV VTI ratio: 0.82  AORTA Ao Root diam: 3.40 cm Ao Asc diam:  3.50 cm MITRAL VALVE  TRICUSPID VALVE MV Area (PHT): 2.66 cm     TR Peak grad:   28.9 mmHg MV Decel Time: 285 msec     TR Vmax:        269.00 cm/s MR Peak grad: 104.9 mmHg MR Mean grad: 70.0 mmHg     SHUNTS MR Vmax:      512.00 cm/s   Systemic VTI:  0.22 m MR Vmean:     397.0 cm/s    Systemic Diam: 2.00 cm MV E velocity: 120.00 cm/s MV A velocity: 118.00 cm/s MV E/A ratio:  1.02 Loralie Champagne MD Electronically signed by Loralie Champagne MD Signature Date/Time: 08/03/2020/3:44:06 PM    Final    US THYROID  Result Date: 08/02/2020 CLINICAL DATA:  Thyroid enlargement EXAM: THYROID ULTRASOUND TECHNIQUE: Ultrasound examination of the thyroid gland and adjacent soft tissues was performed. COMPARISON:  None. FINDINGS: Parenchymal Echotexture: Mildly heterogeneous Isthmus: 0.4 cm Right lobe: 6.7 x 4.0 x 4.3 cm Left lobe: 4.0 x 2.0 x 1.1 cm _________________________________________________________ Estimated total number of nodules >/= 1 cm: 3 Number of spongiform nodules >/=  2 cm not described below (TR1): 0 Number of mixed cystic and solid nodules >/= 1.5 cm not described below (TR2): 0 _________________________________________________________ Nodule # 1: Location: Right; mid Maximum size: 3.8 cm; Other 2 dimensions: 3.5 x 3.4 cm Composition: solid/almost completely solid (2) Echogenicity: isoechoic (1) Shape: not taller-than-wide (0) Margins: smooth (0) Echogenic foci: none (0) ACR TI-RADS total  points: 3. ACR TI-RADS risk category: TR3 (3 points). ACR TI-RADS recommendations: **Given size (>/= 2.5 cm) and appearance, fine needle aspiration of this mildly suspicious nodule should be considered based on TI-RADS criteria. _________________________________________________________ Nodule # 2: Location: Right; mid Maximum size: 2.1 cm; Other 2 dimensions: 1.5 x 1.7 cm Composition: solid/almost completely solid (2) Echogenicity: isoechoic (1) Shape: not taller-than-wide (0) Margins: smooth (0) Echogenic foci: none (0) ACR TI-RADS total points: 3. ACR TI-RADS risk category: TR3 (3 points). ACR TI-RADS recommendations: *Given size (>/= 1.5 - 2.4 cm) and appearance, a follow-up ultrasound in 1 year should be considered based on TI-RADS criteria. _________________________________________________________ Nodule # 3: Location: Right; inferior Maximum size: 2.7 cm; Other 2 dimensions: 2.5 x 2.5 cm Composition: solid/almost completely solid (2) Echogenicity: hypoechoic (2) Shape: not taller-than-wide (0) Margins: smooth (0) Echogenic foci: none (0) ACR TI-RADS total points: 4. ACR TI-RADS risk category: TR4 (4-6 points). ACR TI-RADS recommendations: **Given size (>/= 1.5 cm) and appearance, fine needle aspiration of this moderately suspicious nodule should be considered based on TI-RADS criteria. _________________________________________________________ IMPRESSION: 1. Nodule 1 (TI-RADS 3) and nodule 3 (TI-RADS 4) meets criteria for FNA. 2. Nodule 2 (TI-RADS 3) meets criteria for surveillance. Follow-up thyroid ultrasound should be performed in 1 year. The above is in keeping with the ACR TI-RADS recommendations - J Am Coll Radiol 2017;14:587-595. Electronically Signed   By: Miachel Roux M.D.   On: 08/02/2020 07:17   CT HEAD CODE STROKE WO CONTRAST  Result Date: 08/02/2020 CLINICAL DATA:  Code stroke. Unresponsive. Code stroke presentation. EXAM: CT HEAD WITHOUT CONTRAST TECHNIQUE: Contiguous axial images were  obtained from the base of the skull through the vertex without intravenous contrast. 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 Cerebral hemispheres show extensive chronic small-vessel ischemic changes throughout the deep and subcortical white matter, basal ganglia and thalami. I do not identify an acute infarction, but acute infarction could be hidden within the extensive chronic changes. No mass,  hemorrhage, hydrocephalus or extra-axial collection. Vascular: There is atherosclerotic calcification of the major vessels at the base of the brain. Skull: No skull fracture. Sinuses/Orbits: Clear presently. Chronic mucoperiosteal thickening of the left maxillary sinus. Orbits negative. Other: Left posterior parietal scalp staples. Adjacent sebaceous cyst of the scalp. ASPECTS [facility] Stroke Program Early CT Score) - Ganglionic level infarction (caudate, lentiform nuclei, internal capsule, insula, M1-M3 cortex): 7 - Supraganglionic infarction (M4-M6 cortex): 3 Total score (0-10 with 10 being normal): 10 IMPRESSION: 1. No acute finding by CT. 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 Chronic calcification of the right pons. 2. ASPECTS is 10 3. Discussed by telephone with Dr. Lorrin Goodell at 1412 hours. Electronically Signed   By: Nelson Chimes M.D.   On: 08/02/2020 14:14   CT ANGIO HEAD CODE STROKE  Result Date: 08/02/2020 CLINICAL DATA:  Unresponsive EXAM: CT ANGIOGRAPHY HEAD AND NECK TECHNIQUE: Multidetector CT imaging of the head and neck was performed using the standard protocol during bolus administration of intravenous contrast. Multiplanar CT image reconstructions and MIPs were obtained to evaluate the vascular anatomy. Carotid stenosis measurements (when applicable) are obtained utilizing NASCET criteria, using the distal internal carotid diameter as the denominator. CONTRAST:  82mL OMNIPAQUE  IOHEXOL 350 MG/ML SOLN COMPARISON:  None. FINDINGS: CTA NECK Aortic arch: Calcified plaque along the arch and patent great vessel origins. Right carotid system: Patent. Common carotid is mildly displaced by enlarged thyroid. Calcified plaque at the bifurcation and proximal internal carotid with minimal stenosis. Left carotid system: Patent. Calcified plaque at the common carotid bifurcation and proximal internal carotid causing minimal stenosis. Vertebral arteries: Patent and codominant.  No stenosis. Skeleton: Advanced cervical spine degenerative changes. Degenerative changes of temporomandibular joints. Other neck: Enlarged, heterogeneous thyroid is evaluated on recent ultrasound. Endotracheal and enteric tubes are present. Upper chest: Right much greater than left pleural effusions and left lung consolidation and ground-glass density. Review of the MIP images confirms the above findings CTA HEAD Anterior circulation: The intracranial internal carotid arteries are patent with calcified plaque causing mild stenosis. Anterior and middle cerebral arteries are patent. Posterior circulation: Intracranial vertebral arteries are patent with calcified plaque causing up to moderate stenosis. Basilar artery is patent. Major cerebellar artery origins are patent. Bilateral posterior communicating arteries are present. Posterior cerebral arteries are patent. Venous sinuses: Patent as allowed by contrast bolus timing. Review of the MIP images confirms the above findings IMPRESSION: No large vessel occlusion, hemodynamically significant stenosis, or evidence of dissection. Partially imaged right much greater than left pleural effusions. Density of pleural fluid is greater than simple fluid. Left lung consolidation and ground-glass density is new from 07/31/2020. Electronically Signed   By: Macy Mis M.D.   On: 08/02/2020 14:43   CT ANGIO NECK CODE STROKE  Result Date: 08/02/2020 CLINICAL DATA:  Unresponsive EXAM: CT  ANGIOGRAPHY HEAD AND NECK TECHNIQUE: Multidetector CT imaging of the head and neck was performed using the standard protocol during bolus administration of intravenous contrast. Multiplanar CT image reconstructions and MIPs were obtained to evaluate the vascular anatomy. Carotid stenosis measurements (when applicable) are obtained utilizing NASCET criteria, using the distal internal carotid diameter as the denominator. CONTRAST:  31mL OMNIPAQUE IOHEXOL 350 MG/ML SOLN COMPARISON:  None. FINDINGS: CTA NECK Aortic arch: Calcified plaque along the arch and patent great vessel origins. Right carotid system: Patent. Common carotid is mildly displaced by enlarged thyroid. Calcified plaque at the bifurcation and proximal internal carotid with minimal stenosis. Left carotid system: Patent. Calcified plaque at the  common carotid bifurcation and proximal internal carotid causing minimal stenosis. Vertebral arteries: Patent and codominant.  No stenosis. Skeleton: Advanced cervical spine degenerative changes. Degenerative changes of temporomandibular joints. Other neck: Enlarged, heterogeneous thyroid is evaluated on recent ultrasound. Endotracheal and enteric tubes are present. Upper chest: Right much greater than left pleural effusions and left lung consolidation and ground-glass density. Review of the MIP images confirms the above findings CTA HEAD Anterior circulation: The intracranial internal carotid arteries are patent with calcified plaque causing mild stenosis. Anterior and middle cerebral arteries are patent. Posterior circulation: Intracranial vertebral arteries are patent with calcified plaque causing up to moderate stenosis. Basilar artery is patent. Major cerebellar artery origins are patent. Bilateral posterior communicating arteries are present. Posterior cerebral arteries are patent. Venous sinuses: Patent as allowed by contrast bolus timing. Review of the MIP images confirms the above findings IMPRESSION: No  large vessel occlusion, hemodynamically significant stenosis, or evidence of dissection. Partially imaged right much greater than left pleural effusions. Density of pleural fluid is greater than simple fluid. Left lung consolidation and ground-glass density is new from 07/31/2020. Electronically Signed   By: Macy Mis M.D.   On: 08/02/2020 14:43    Assessment/Plan  1. Closed fracture of tenth thoracic vertebra with delayed healing, unspecified fracture morphology, subsequent encounter -  S/P stabilization on 08/01/20 - was started on Bactrim DS 1 tab BID X 10 days -   Follow up with neurosurgery -  Continue wound treatment daily, TLSO brace  2. Type 2 diabetes mellitus with stage 3b chronic kidney disease, without long-term current use of insulin (HCC) Lab Results  Component Value Date   HGBA1C 5.4 08/01/2020   -Continue Januvia and CBG checks  3. Acute blood loss anemia Lab Results  Component Value Date   HGB 8.5 (L) 08/08/2020   -Continue ferrous sulfate  4. Chronic diastolic (congestive) heart failure (HCC) -  No SOB, continue furosemide   Family/ staff Communication: Discussed plan of care with resident and charge nurse.  Labs/tests ordered:    CBC and BMP  Goals of care:   Short-term care   Durenda Age, DNP, MSN, FNP-BC [facility] [phone] (Monday-Friday 8:00 a.m. - 5:00 p.m.) [phone] (after hours)

## 2020-08-29 ENCOUNTER — Encounter (HOSPITAL_COMMUNITY): Payer: Self-pay | Admitting: Neurosurgery

## 2020-08-29 ENCOUNTER — Other Ambulatory Visit: Payer: Self-pay | Admitting: Neurosurgery

## 2020-08-29 ENCOUNTER — Encounter: Payer: Self-pay | Admitting: Adult Health

## 2020-08-29 ENCOUNTER — Other Ambulatory Visit: Payer: Self-pay

## 2020-08-29 ENCOUNTER — Non-Acute Institutional Stay (SKILLED_NURSING_FACILITY): Payer: Medicare PPO | Admitting: Adult Health

## 2020-08-29 DIAGNOSIS — T8149XA Infection following a procedure, other surgical site, initial encounter: Secondary | ICD-10-CM

## 2020-08-29 DIAGNOSIS — S22079G Unspecified fracture of T9-T10 vertebra, subsequent encounter for fracture with delayed healing: Secondary | ICD-10-CM | POA: Diagnosis not present

## 2020-08-29 DIAGNOSIS — E1122 Type 2 diabetes mellitus with diabetic chronic kidney disease: Secondary | ICD-10-CM

## 2020-08-29 DIAGNOSIS — N1832 Chronic kidney disease, stage 3b: Secondary | ICD-10-CM

## 2020-08-29 DIAGNOSIS — I10 Essential (primary) hypertension: Secondary | ICD-10-CM

## 2020-08-29 DIAGNOSIS — I5032 Chronic diastolic (congestive) heart failure: Secondary | ICD-10-CM | POA: Diagnosis not present

## 2020-08-29 NOTE — Progress Notes (Signed)
Location:  [facility]: [address] Place of Service:  SNF (31) Provider:  Durenda Age, DNP, FNP-BC  Patient Care Team: Bernerd Limbo, MD as PCP - General (Family Medicine)  Extended Emergency Contact Information Primary Emergency Contact: Romberg,Ronald Address: [address]          [place] Phone: [phone] Mobile Phone: [phone] Relation: Spouse Secondary Emergency Contact: Enrique Sack Mobile Phone: [phone] Relation: Son Interpreter needed? No  Code Status:  FULL CODE  Goals of care: Advanced Directive information Advanced Directives 08/29/2020  Does Patient Have a Medical Advance Directive? No  Would patient like information on creating a medical advance directive? No - Patient declined  Pre-existing out of facility DNR order (yellow form or pink MOST form) -     Chief Complaint  Patient presents with  . Acute Visit    For possible discharge to ALF    HPI:  Pt is a 75 y.o. female who is for possible discharge to ALF. She was was seen at [facility] today for back wound evaluation. [name] stated that resident will have midback wound debridement tomorrow. She had a fall at home for which she sustained an unstable T10 vertebral fracture.  She underwent T8-11 stab incision of her unstable T10 fracture on 08/01/2020. She was started on Bactrim DS BID on 08/24/20 for her surgical wound infection. She has an eschar and slough. No reported fever.   Past Medical History:  Diagnosis Date  . Allergy   . Anemia   . Arthritis   . Cataract    biltaerally   . CKD (chronic kidney disease) stage 3, GFR 30-59 ml/min (HCC) 08/29/2011   per patient, kidneys monitored by Dr Posey Pronto endocrinologist ,lov was  05-18-2018, "kidney fx stable"  . Colon polyp   . Complication of anesthesia   . Difficult intubation    small mouth opening  . Diverticulosis   .  Gout   . HTN (hypertension) 08/29/2011  . Hyperlipemia   . Psoriasis   . Type II or unspecified type diabetes mellitus without mention of complication, not stated as uncontrolled 08/29/2011   Past Surgical History:  Procedure Laterality Date  . BREAST LUMPECTOMY WITH NEEDLE LOCALIZATION Right 05/07/2012   Procedure: BREAST LUMPECTOMY WITH NEEDLE LOCALIZATION;  Surgeon: Merrie Roof, MD;  Location: [place];  Service: General;  Laterality: Right;  . CHOLECYSTECTOMY    . COLONOSCOPY  2009   Ardis Hughs   . COLONOSCOPY WITH PROPOFOL N/A 05/20/2018   Procedure: COLONOSCOPY WITH PROPOFOL;  Surgeon: Milus Banister, MD;  Location: [facility];  Service: Endoscopy;  Laterality: N/A;  . HERNIA REPAIR    . HIP SURGERY    . LAMINECTOMY WITH POSTERIOR LATERAL ARTHRODESIS LEVEL 4 N/A 08/01/2020   Procedure: LAMINECTOMY WITH POSTERIOR LATERAL ARTHRODESIS Thoracic nine- Thoracic eleven ;  Surgeon: Consuella Lose, MD;  Location: [place];  Service: Neurosurgery;  Laterality: N/A;  . POLYPECTOMY  05/20/2018   Procedure: POLYPECTOMY;  Surgeon: Milus Banister, MD;  Location: [facility];  Service: Endoscopy;;  . RHINOPLASTY    . SALPINGOOPHORECTOMY    . TOTAL HIP ARTHROPLASTY Right 2004    Allergies  Allergen Reactions  . Codeine Itching  . Iodinated Diagnostic Agents     Other reaction(s): Unknown  . Pentazocine Other (See Comments)    Hallucinations  . Statins     Outpatient Encounter Medications as of 08/29/2020  Medication Sig  .  allopurinol (ZYLOPRIM) 300 MG tablet Take 150 mg by mouth in the morning. (0900)  . atenolol (TENORMIN) 100 MG tablet Take 100 mg by mouth in the morning. (0900)  . b complex vitamins tablet Take 1 tablet by mouth in the morning. (0900)  . benzonatate (TESSALON) 100 MG capsule Take 100 mg by mouth every 8 (eight) hours as needed for cough.  . bisacodyl (DULCOLAX) 10 MG suppository Place 10 mg rectally as needed for moderate constipation.  . cloNIDine (CATAPRES) 0.2 MG  tablet Take 1 tablet (0.2 mg total) by mouth 2 (two) times daily. (Patient taking differently: Take 0.2 mg by mouth 2 (two) times daily. (0900 & 2100))  . ferrous sulfate 325 (65 FE) MG tablet Take 325 mg by mouth daily with breakfast. (0900)  . furosemide (LASIX) 40 MG tablet Take 40 mg by mouth in the morning. (0900)  . HYDROcodone-acetaminophen (NORCO/VICODIN) 5-325 MG tablet Take 2 tablets by mouth every 6 (six) hours as needed for moderate pain or severe pain.  Marland Kitchen loratadine (CLARITIN) 10 MG tablet Take 10 mg by mouth in the morning. (0900)  . mometasone (ELOCON) 0.1 % cream Apply 1 application topically daily as needed (psoriasis).  . Omega-3 1000 MG CAPS Take 1,000 mg by mouth 2 (two) times daily. (0900 & 2100)  . ondansetron (ZOFRAN) 4 MG tablet Take 4 mg by mouth every 8 (eight) hours as needed for nausea or vomiting.  . polyethylene glycol (MIRALAX / GLYCOLAX) 17 g packet Take 17 g by mouth daily. (Patient taking differently: Take 17 g by mouth in the morning. (0900))  . sitaGLIPtin (JANUVIA) 50 MG tablet Take 50 mg by mouth in the morning. (0900)  . [DISCONTINUED] Magnesium Hydroxide (MILK OF MAGNESIA PO) Take 30 mLs by mouth as needed.  . [DISCONTINUED] mupirocin cream (BACTROBAN) 2 % Apply 1 application topically.  . [DISCONTINUED] Sodium Phosphates (RA SALINE ENEMA RE) Place rectally as needed.  . [DISCONTINUED] Sulfamethoxazole-Trimethoprim (BACTRIM DS PO) Take 2 tablets by mouth every 12 (twelve) hours.   No facility-administered encounter medications on file as of 08/29/2020.    Review of Systems  GENERAL: No change in appetite, no fever or chills MOUTH and THROAT: Denies oral discomfort, gingival pain  RESPIRATORY: no cough, SOB, DOE, wheezing, hemoptysis CARDIAC: No chest pain or palpitations GI: No abdominal pain, diarrhea, constipation, heart burn, nausea or vomiting GU: Denies dysuria, frequency, hematuria, incontinence, or discharge NEUROLOGICAL: Denies dizziness,  syncope, numbness, or headache PSYCHIATRIC: Denies feelings of depression or anxiety. No report of hallucinations, insomnia, paranoia, or agitation   Immunization History  Administered Date(s) Administered  . Fluad Quad(high Dose 65+) 12/03/2018  . Influenza Split 12/22/2008, 11/30/2009, 12/13/2010, 12/25/2011  . Influenza, High Dose Seasonal PF 01/11/2015, 11/23/2017, 12/03/2018, 12/13/2019  . Influenza, Seasonal, Injecte, Preservative Fre 12/16/2013  . Influenza,inj,Quad PF,6+ Mos 12/11/2015, 11/14/2016  . Influenza,inj,Quad PF,6-35 Mos 12/11/2015, 11/14/2016  . Influenza,inj,quad, With Preservative 12/16/2013  . PFIZER(Purple Top)SARS-COV-2 Vaccination 05/01/2019, 05/26/2019, 11/22/2019, 07/17/2020  . Pneumococcal Conjugate-13 01/16/2012  . Pneumococcal Polysaccharide-23 03/24/2005, 08/08/2014  . Tdap 09/13/2010   Pertinent  Health Maintenance Due  Topic Date Due  . FOOT EXAM  Never done  . OPHTHALMOLOGY EXAM  Never done  . MAMMOGRAM  Never done  . DEXA SCAN  Never done  . INFLUENZA VACCINE  10/22/2020  . HEMOGLOBIN A1C  02/01/2021  . COLONOSCOPY (Pts 45-102yrs Insurance coverage will need to be confirmed)  05/20/2028  . PNA vac Low Risk Adult  Completed   No flowsheet  data found.   Vitals:   08/29/20 1042  BP: 101/65  Pulse: 61  Resp: 20  Temp: 98 F (36.7 C)  Weight: 161 lb 12.8 oz (73.4 kg)  Height: 5\' 3"  (1.6 m)   Body mass index is 28.66 kg/m.  Physical Exam  GENERAL APPEARANCE: Well nourished. In no acute distress.  SKIN:  midback surgical wound with slogh and eschar, surrounding skin has erythema MOUTH and THROAT: Lips are without lesions. Oral mucosa is moist and without lesions.  RESPIRATORY: Breathing is even & unlabored, BS CTAB CARDIAC: RRR, no murmur,no extra heart sounds, BLE 2+ edema GI: Abdomen soft, normal BS, no masses, no tenderness NEUROLOGICAL: There is no tremor. Speech is clear. Alert and oriented X 3. PSYCHIATRIC:  Affect and behavior  are appropriate  Labs reviewed: Recent Labs    08/01/20 0559 08/01/20 1226 08/03/20 0804 08/04/20 0425 08/06/20 1607 08/07/20 0208 08/08/20 0056  NA 137   < > 139   < > 141 139 137  K 5.6*   < > 4.2   < > 3.8 3.7 3.5  CL 107   < > 111   < > 109 107 106  CO2 22   < > 20*   < > 24 25 26   GLUCOSE 194*   < > 76   < > 100* 96 113*  BUN 68*   < > 72*   < > 62* 68* 62*  CREATININE 2.68*   < > 2.04*   < > 1.68* 1.76* 1.53*  CALCIUM 8.7*   < > 7.7*   < > 8.3* 8.3* 8.5*  MG 1.9  --  2.0  --   --   --   --   PHOS 6.4*  --   --   --   --   --   --    < > = values in this interval not displayed.   Recent Labs    08/01/20 0559 08/03/20 1159  AST 21  --   ALT 15  --   ALKPHOS 86  --   BILITOT 0.7  --   PROT 6.4*  --   ALBUMIN 2.7* 2.4*   Recent Labs    08/06/20 0652 08/07/20 0208 08/08/20 0056  WBC 9.8 10.5 12.4*  NEUTROABS 7.2 7.2 9.3*  HGB 8.8* 8.5* 8.5*  HCT 27.3* 26.6* 26.9*  MCV 87.8 87.2 87.9  PLT 204 204 237   Lab Results  Component Value Date   TSH 1.188 08/01/2020   Lab Results  Component Value Date   HGBA1C 5.4 08/01/2020   Lab Results  Component Value Date   CHOL 116 08/31/2011   HDL 21 (L) 08/31/2011   LDLCALC 35 08/31/2011   TRIG 221 (H) 08/06/2020   CHOLHDL 5.5 08/31/2011    Significant Diagnostic Results in last 30 days:  CT ABDOMEN PELVIS WO CONTRAST  Result Date: 08/02/2020 CLINICAL DATA:  Nausea and vomiting. EXAM: CT ABDOMEN AND PELVIS WITHOUT CONTRAST TECHNIQUE: Multidetector CT imaging of the abdomen and pelvis was performed following the standard protocol without IV contrast. COMPARISON:  X-ray abdomen 08/01/2020 FINDINGS: Lower chest: Interval increase in size of a heterogeneous moderate to large volume right pleural effusion. Associated complete collapse of the right lower lobe. Trace left pleural effusion. Hepatobiliary: No focal liver abnormality is seen. Status post cholecystectomy. No biliary dilatation. Pancreas: No focal lesion. Normal  pancreatic contour. No surrounding inflammatory changes. No main pancreatic ductal dilatation. Spleen: Normal in size without focal abnormality. Adrenals/Urinary  Tract: No adrenal nodule bilaterally. Nonspecific bilateral perinephric stranding. Bilateral renal calcifications measuring up to 5 mm. No hydronephrosis. Exophytic 1.7 cm fluid density lesion within the right kidney likely represents a simple renal cyst. Otherwise no contour-deforming renal mass. No ureterolithiasis or hydroureter. Limited evaluation of distal ureters due to streak artifact originating from the right femoral surgical hardware. The urinary bladder is decompressed with a Foley catheter terminating within its lumen. There is limited evaluation of the urinary bladder due to streak artifact originating from the right surgical femoral hardware. Stomach/Bowel: Enteric tube coursing within the gastric lumen with side port just distal to the gastroesophageal junction. Stomach is within normal limits. No evidence of bowel wall thickening or dilatation. Scattered colonic diverticulosis. Appendix appears normal. Vascular/Lymphatic: No abdominal aorta or iliac aneurysm. Moderate atherosclerotic plaque of the aorta and its branches. No abdominal, pelvic, or inguinal lymphadenopathy. Reproductive: Uterus and bilateral adnexa are unremarkable. Other: No intraperitoneal free fluid. No intraperitoneal free gas. No organized fluid collection. Musculoskeletal: Anterior abdominal hernia repair with mesh. Interval placement of partially visualized posterolateral thoracic fusion surgical hardware in a patient with T10-T11 acute vertebral body fracture with extension to the posterior elements. Slight angulation of the left T11 (3:12) surgical hardware with no violation of the spinal canal or findings to suggest impingement on the nerve roots. Overlying skin staples and subcutaneus soft tissue edema. Multilevel severe degenerative changes of the spine with fusion  of the L3 through L5 vertebral bodies leading to straightening of the normal lumbar lordosis. Mild retrolisthesis of L5 on S1. No suspicious lytic or blastic osseous lesions. Total right hip arthroplasty.  Degenerative changes of the left hip. IMPRESSION: 1. Interval increase in size of a heterogeneous moderate to large volume right pleural effusion. Areas of increased density within the pleural fluid may represent hemothorax, collapsed right lower lobe lung, or less likely a mass lesion. 2. Interval development of trace left pleural effusion. 3. Nonspecific bilateral perinephric stranding. Consider correlation with urinalysis for infection. 4. Bilateral nonobstructive nephrolithiasis measuring up to 5 mm. 5. Scattered colonic diverticulosis with no acute diverticulitis. 6. Known T10-T11 fracture with interval placement of partially visualized posterolateral thoracic fusion surgical hardware. 7. Severe degenerative changes of the lumbar spine with L3 through L5 vertebral body fusion. 8.  Aortic Atherosclerosis (ICD10-I70.0). Electronically Signed   By: Iven Finn M.D.   On: 08/02/2020 00:09   DG Ribs Bilateral W/Chest  Result Date: 07/31/2020 CLINICAL DATA:  Fall.  Pain. EXAM: BILATERAL RIBS AND CHEST - 4+ VIEW COMPARISON:  Remote chest radiograph 05/03/2012. No interval chest imaging. FINDINGS: No fracture or other bone lesions are seen involving the ribs. Right pleural effusion at least moderate in size. No pneumothorax. Heart size and mediastinal contours are within normal limits. Aortic atherosclerosis. IMPRESSION: 1. No evidence of rib fracture. 2. Right pleural effusion at least moderate in size. This is of unknown acuity. Electronically Signed   By: Keith Rake M.D.   On: 07/31/2020 20:39   DG Thoracic Spine 2 View  Result Date: 07/31/2020 CLINICAL DATA:  Unwitnessed fall.  Pain. EXAM: THORACIC SPINE 2 VIEWS COMPARISON:  None. FINDINGS: Broad-based dextroscoliotic curvature. No evidence of  acute fracture or compression deformity. Flowing anterior osteophytes throughout the thoracic spine. Minimal disc space narrowing at T10-T11 and T11-T12. no paravertebral soft tissue abnormality IMPRESSION: 1. No radiographic evidence of acute fracture of the thoracic spine. 2. Scoliosis and degenerative change. Electronically Signed   By: Keith Rake M.D.   On: 07/31/2020 20:36  DG THORACOLUMABAR SPINE  Result Date: 08/01/2020 CLINICAL DATA:  T8 through 11 fusion. EXAM: THORACOLUMBAR SPINE 1V COMPARISON:  Preoperative imaging. FINDINGS: Two fluoroscopic spot views of the thoracic spine obtained in frontal and lateral projections. Pedicle rods in the lower thoracic spine, levels difficult to accurately delineate on coned view. Total fluoroscopy time 20 seconds. Total dose 7.78 mGy. IMPRESSION: Intraoperative fluoroscopy during thoracic fusion. Electronically Signed   By: Keith Rake M.D.   On: 08/01/2020 16:16   DG Lumbar Spine Complete  Result Date: 07/31/2020 CLINICAL DATA:  Unwitnessed fall.  Pain. EXAM: LUMBAR SPINE - COMPLETE 4+ VIEW COMPARISON:  None. FINDINGS: Five non-rib-bearing lumbar vertebra. Straightening of normal lordosis. Near complete disc space loss at L4-L5 with prominent disc space narrowing at L3-L4 and L5-S1. Endplate spurring with lesser disc space narrowing at L1-L2 and L2-L3. Multilevel facet hypertrophy. Vertebral body heights are preserved. No evidence of fracture. Degenerative change of both sacroiliac joints. IMPRESSION: 1. No fracture of the lumbar spine. 2. Straightening of normal lordosis may be positioning or muscle spasm. Multilevel degenerative disc disease and facet hypertrophy. Electronically Signed   By: Keith Rake M.D.   On: 07/31/2020 20:37   DG Forearm Left  Result Date: 08/01/2020 CLINICAL DATA:  Fall yesterday with swelling. EXAM: LEFT FOREARM - 2 VIEW COMPARISON:  None. FINDINGS: Advanced degenerative changes involving the carpal bones and  radiocarpal articulation. There is also more mild to moderate degenerative change about the elbow. No acute fracture or dislocation. No elbow joint effusion. Suboptimal patient positioning secondary to clinical status. Possible soft tissue swelling adjacent the proximal ulna. IMPRESSION: Advanced degenerative change, without acute osseous abnormality. Electronically Signed   By: Abigail Miyamoto M.D.   On: 08/01/2020 13:48   CT Head Wo Contrast  Result Date: 07/31/2020 CLINICAL DATA:  Recent fall from chair, initial encounter EXAM: CT HEAD WITHOUT CONTRAST CT CERVICAL SPINE WITHOUT CONTRAST TECHNIQUE: Multidetector CT imaging of the head and cervical spine was performed following the standard protocol without intravenous contrast. Multiplanar CT image reconstructions of the cervical spine were also generated. COMPARISON:  None. FINDINGS: CT HEAD FINDINGS Brain: No evidence of acute infarction, hemorrhage, hydrocephalus, extra-axial collection or mass lesion/mass effect. Calcification is noted in the midportion of the pons. Mild chronic white matter ischemic changes are noted. Vascular: No hyperdense vessel or unexpected calcification. Skull: Normal. Negative for fracture or focal lesion. Sinuses/Orbits: No acute finding. Other: Mild parietal scalp hematoma is noted in the vertex on the left. CT CERVICAL SPINE FINDINGS Alignment: Mild loss of normal cervical lordosis is noted likely related to muscular spasm Skull base and vertebrae: 7 cervical segments are well visualized. Vertebral body height is well maintained. Multilevel osteophytic changes and facet hypertrophic changes are noted. No acute fracture or acute facet abnormality is seen. Soft tissues and spinal canal: Surrounding soft tissue structures show vascular calcifications. Enlargement of the right lobe of the thyroid is noted with evidence of calcifications and a nodule which measures at least 2.5 cm. No other soft tissue abnormality is noted. Upper chest:  Visualized lung apices demonstrate evidence of a right-sided pleural effusion. This is of uncertain chronicity. No rib abnormality is noted. Other: None IMPRESSION: CT of the head: Left parietal scalp hematoma. No acute intracranial abnormality is noted. CT of the cervical spine: Multilevel degenerative changes without acute bony abnormality. Right-sided pleural effusion. Prominent right lobe of the thyroid with at least 1 nodule within. Recommend nonemergent thyroid US (ref: J Am Coll Radiol. 2015 Feb;12(2): 143-50). Electronically  Signed   By: Inez Catalina M.D.   On: 07/31/2020 18:10   CT Chest Wo Contrast  Result Date: 07/31/2020 CLINICAL DATA:  Fall.  Chest trauma. EXAM: CT CHEST WITHOUT CONTRAST TECHNIQUE: Multidetector CT imaging of the chest was performed following the standard protocol without IV contrast. COMPARISON:  Ribbon thoracic spine radiographs earlier today. FINDINGS: Cardiovascular: Moderate aortic atherosclerosis. No periaortic stranding to suggest injury. There are coronary artery calcifications. Heart is normal in size, slightly displaced into the left chest. No pericardial effusion. Mediastinum/Nodes: Scattered small mediastinal lymph nodes, not enlarged by size criteria. No esophageal wall thickening. Heterogeneously enlarged right lobe of the thyroid gland with scattered curvilinear calcifications. Included left thyroid lobe is normal. Lungs/Pleura: Moderate to large right pleural effusion, heterogeneous density. Areas of increased density within the pleural fluid may represent hemorrhage, collapsed lung or less likely masses. Small amount of pleural fluid appears loculated and tracks anteromedially. No pneumothorax. Left lung is clear. No left pleural effusion. Upper Abdomen: No definite free fluid in the upper abdomen. Diverticulosis at the splenic flexure of the colon. Cholecystectomy. Musculoskeletal: Bones are diffusely under mineralized. Diffuse thoracic ankylosis. Displaced  fracture through T10 vertebral body involves the anterior third, extend into the T9-T10 disc space, and is displaced anteriorly. Fracture extends through anterior osteophytes as well as the anterior superior aspect of T11 vertebral body. No convincing extension to involve the posterior elements. There is adjacent paravertebral hemorrhage and small volume of gas, likely related to escaped vacuum phenomenon. No additional thoracic spine fracture. Diffuse ankylosis with flowing anterior osteophytes. No acute fracture of the ribs, included clavicles or shoulder girdles. No sternal fracture. IMPRESSION: 1. Extension injury of the thoracic spine with displaced T10 vertebral body fracture extending into the T9-T10 disc space and anterior vertebral body. Fracture extends through anterior osteophytes as well as the anterior superior aspect of T11 vertebral body. No convincing extension to involve the posterior elements. There is adjacent paravertebral hemorrhage and small volume of gas, likely related to escaped vacuum phenomenon. Recommend spine consultation. 2. Moderate to large right pleural effusion, heterogeneous density. Areas of increased density within the pleural fluid may represent hemorrhage, collapsed lung or less likely masses. Recommend continued radiographic follow-up to ensure resolution. No pneumothorax or rib fractures. 3. Heterogeneously enlarged right lobe of the thyroid gland with scattered curvilinear calcifications. Recommend thyroid US (ref: J Am Coll Radiol. 2015 Feb;12(2): 143-50). Aortic Atherosclerosis (ICD10-I70.0). Electronically Signed   By: Keith Rake M.D.   On: 07/31/2020 22:29   CT Cervical Spine Wo Contrast  Result Date: 07/31/2020 CLINICAL DATA:  Recent fall from chair, initial encounter EXAM: CT HEAD WITHOUT CONTRAST CT CERVICAL SPINE WITHOUT CONTRAST TECHNIQUE: Multidetector CT imaging of the head and cervical spine was performed following the standard protocol without  intravenous contrast. Multiplanar CT image reconstructions of the cervical spine were also generated. COMPARISON:  None. FINDINGS: CT HEAD FINDINGS Brain: No evidence of acute infarction, hemorrhage, hydrocephalus, extra-axial collection or mass lesion/mass effect. Calcification is noted in the midportion of the pons. Mild chronic white matter ischemic changes are noted. Vascular: No hyperdense vessel or unexpected calcification. Skull: Normal. Negative for fracture or focal lesion. Sinuses/Orbits: No acute finding. Other: Mild parietal scalp hematoma is noted in the vertex on the left. CT CERVICAL SPINE FINDINGS Alignment: Mild loss of normal cervical lordosis is noted likely related to muscular spasm Skull base and vertebrae: 7 cervical segments are well visualized. Vertebral body height is well maintained. Multilevel osteophytic changes and facet hypertrophic changes  are noted. No acute fracture or acute facet abnormality is seen. Soft tissues and spinal canal: Surrounding soft tissue structures show vascular calcifications. Enlargement of the right lobe of the thyroid is noted with evidence of calcifications and a nodule which measures at least 2.5 cm. No other soft tissue abnormality is noted. Upper chest: Visualized lung apices demonstrate evidence of a right-sided pleural effusion. This is of uncertain chronicity. No rib abnormality is noted. Other: None IMPRESSION: CT of the head: Left parietal scalp hematoma. No acute intracranial abnormality is noted. CT of the cervical spine: Multilevel degenerative changes without acute bony abnormality. Right-sided pleural effusion. Prominent right lobe of the thyroid with at least 1 nodule within. Recommend nonemergent thyroid US (ref: J Am Coll Radiol. 2015 Feb;12(2): 143-50). Electronically Signed   By: Inez Catalina M.D.   On: 07/31/2020 18:10   MR BRAIN WO CONTRAST  Result Date: 08/04/2020 CLINICAL DATA:  Seizure with abnormal mental status EXAM: MRI HEAD  WITHOUT CONTRAST TECHNIQUE: Multiplanar, multiecho pulse sequences of the brain and surrounding structures were obtained without intravenous contrast. COMPARISON:  CT and CTA from 2 days ago FINDINGS: Brain: No acute infarction, hemorrhage, hydrocephalus, extra-axial collection or mass lesion. Extensive low-density in the cerebral white matter attributed to chronic small vessel ischemia. There is both gliosis and scattered white matter lacunes. Cystic changes at the bilateral globus pallidus which may reflect remote insult. Preserved brain volume for age. Vascular: Preserved flow voids Skull and upper cervical spine: Normal marrow signal. 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 Artifact in the scalp where there are skin staples by prior CT. Sinuses/Orbits: No acute finding Other: Truncated and motion degraded study. The patient was stopped early due to hypoxia. IMPRESSION: 1. No acute finding. 2. Extensive chronic small vessel disease. 3. Motion degraded and truncated study due to patient condition. Electronically Signed   By: Monte Fantasia M.D.   On: 08/04/2020 09:58   DG Chest Port 1 View  Result Date: 08/04/2020 CLINICAL DATA:  Evaluate respiratory distress EXAM: PORTABLE CHEST 1 VIEW COMPARISON:  Yesterday FINDINGS: Cardiomegaly, indistinct hila, and diffuse interstitial and hazy airspace opacity. There are likely small pleural effusions. No pneumothorax. Postoperative thoracic spine. IMPRESSION: CHF. Electronically Signed   By: Monte Fantasia M.D.   On: 08/04/2020 10:42   DG CHEST PORT 1 VIEW  Result Date: 08/03/2020 CLINICAL DATA:  Status post right thoracentesis EXAM: PORTABLE CHEST 1 VIEW COMPARISON:  08/02/2020 FINDINGS: Improvement in the right effusion following thoracentesis. There is better aeration in the right lower lung. Negative for pneumothorax. Residual bibasilar atelectasis and left lower lobe airspace opacity may represent pneumonia. Heart is enlarged. Postop changes from  lower thoracic fusion evident. NG tube extends below the hemidiaphragms with the tip not visualized as it enters the stomach. Endotracheal tube 3 cm above the carina. IMPRESSION: Improvement in the right effusion following thoracentesis. Negative for pneumothorax. Otherwise stable portable chest exam. Electronically Signed   By: Jerilynn Mages.  Shick M.D.   On: 08/03/2020 13:32   DG CHEST PORT 1 VIEW  Result Date: 08/02/2020 CLINICAL DATA:  Intubation, ventilator dependence EXAM: PORTABLE CHEST 1 VIEW COMPARISON:  Portable exam 1557 hours compared to 07/31/2020 FINDINGS: Tip of endotracheal tube projects 1.9 cm above carina. Nasogastric tube extends into abdomen. Normal heart size, mediastinal contours, and pulmonary vascularity. Diffuse pulmonary infiltrates greater on LEFT, question asymmetric edema versus pneumonia. Persistent RIGHT pleural effusion. No pneumothorax or acute osseous findings. Bones demineralized with prior thoracic spinal fixation. IMPRESSION: Increased pulmonary infiltrates question  edema versus infection. Persistent RIGHT pleural effusion. Electronically Signed   By: Lavonia Dana M.D.   On: 08/02/2020 17:19   DG Abd Portable 1V  Result Date: 08/01/2020 CLINICAL DATA:  NG tube placement EXAM: PORTABLE ABDOMEN - 1 VIEW COMPARISON:  None FINDINGS: Partially visualized thoracic spinal hardware. Esophageal tube tip and side port overlie the mid stomach. Gas pattern unremarkable IMPRESSION: Esophageal tube tip and side port overlie the mid stomach. Electronically Signed   By: Donavan Foil M.D.   On: 08/01/2020 17:21   EEG adult  Result Date: 08/02/2020 Lora Havens, MD     08/02/2020  9:08 PM Patient Name: Jacqueline Richmond MRN: [id number] Epilepsy Attending: Lora Havens Referring Physician/Provider: Dr Donnetta Simpers Date: 08/02/2020 Duration: 24.40 mins Patient history: 75 y.o. female with PMH significant for CKD, HTN, Gout, HTN, DM2, HLd who is admitted with T spine fx s/p fall who had  sudden unresponsiveness with stiffening of BL upper extremities and starring off. EEG to evaluate for seizure Level of alertness:  comatose AEDs during EEG study: LEV Technical aspects: This EEG study was done with scalp electrodes positioned according to the 10-20 International system of electrode placement. Electrical activity was acquired at a sampling rate of 500Hz  and reviewed with a high frequency filter of 70Hz  and a low frequency filter of 1Hz . EEG data were recorded continuously and digitally stored. Description: EEG showed continuous generalized 3 to 6 Hz theta-delta slowing admixed with 15-18hz  fronto-central beta activity. Hyperventilation and photic stimulation were not performed.   ABNORMALITY - Continuous slow, generalized IMPRESSION: This study is suggestive of moderate diffuse encephalopathy, nonspecific etiology but likely related to sedation. No seizures or epileptiform discharges were seen throughout the recording. Priyanka Barbra Sarks   Overnight EEG with video  Result Date: 08/03/2020 Lora Havens, MD     08/03/2020 11:20 AM Patient Name: Jacqueline Richmond MRN: [id number] Epilepsy Attending: Lora Havens Referring Physician/Provider: Dr Donnetta Simpers Duration: 08/02/2020 1809 to 08/03/2020 1004  Patient history: 75 y.o.femalewith PMH significant for CKD, HTN, Gout, HTN, DM2, HLd who is admitted with T spine fx s/p fall who had sudden unresponsiveness with stiffening of BL upper extremities and starring off. EEG to evaluate for seizure  Level of alertness:  awake, asleep  AEDs during EEG study: LEV, Propofol  Technical aspects: This EEG study was done with scalp electrodes positioned according to the 10-20 International system of electrode placement. Electrical activity was acquired at a sampling rate of 500Hz  and reviewed with a high frequency filter of 70Hz  and a low frequency filter of 1Hz . EEG data were recorded continuously and digitally stored.  Description: The posterior  dominant rhythm consists of 9 Hz activity of moderate voltage (25-35 uV) seen predominantly in posterior head regions, symmetric and reactive to eye opening and eye closing. Sleep was characterized by vertex waves, sleep spindles (12 to 14 Hz), maximal frontocentral region. EEG showed continuous generalized 3 to 6 Hz theta-delta slowing admixed with 15-18hz  fronto-central beta activity. Hyperventilation and photic stimulation were not performed.    ABNORMALITY - Continuous slow, generalized  IMPRESSION: This study is suggestive of moderate diffuse encephalopathy, nonspecific etiology but likely related to sedation. No seizures or epileptiform discharges were seen throughout the recording.  Lora Havens   DG C-Arm [phone] Min  Result Date: 08/01/2020 CLINICAL DATA:  T8 through 11 fusion. EXAM: THORACOLUMBAR SPINE 1V COMPARISON:  Preoperative imaging. FINDINGS: Two fluoroscopic spot views of the thoracic spine obtained in frontal and  lateral projections. Pedicle rods in the lower thoracic spine, levels difficult to accurately delineate on coned view. Total fluoroscopy time 20 seconds. Total dose 7.78 mGy. IMPRESSION: Intraoperative fluoroscopy during thoracic fusion. Electronically Signed   By: Keith Rake M.D.   On: 08/01/2020 16:16   DG C-Arm 1-60 Min-No Report  Result Date: 08/07/2020 Fluoroscopy was utilized by the requesting physician.  No radiographic interpretation.   ECHOCARDIOGRAM COMPLETE  Result Date: 08/03/2020    ECHOCARDIOGRAM REPORT   Patient Name:   SHARNIKA BINNEY Date of Exam: 08/03/2020 Medical Rec #:  [id number]     Height:       63.0 in Accession #:    [id number]    Weight:       186.0 lb Date of Birth:  1946/01/14      BSA:          1.875 m Patient Age:    55 years      BP:           95/54 mmHg Patient Gender: F             HR:           89 bpm. Exam Location:  Inpatient Procedure: 2D Echo, Cardiac Doppler and Color Doppler Indications:    Pleural effusion  History:        Patient  has no prior history of Echocardiogram examinations.                 Risk Factors:Diabetes, Hypertension and Dyslipidemia.  Sonographer:    Luisa Hart RDCS Referring Phys: [place]  1. Left ventricular ejection fraction, by estimation, is 55 to 60%. The left ventricle has normal function. The left ventricle has no regional wall motion abnormalities. Left ventricular diastolic parameters are consistent with Grade I diastolic dysfunction (impaired relaxation).  2. Right ventricular systolic function is normal. The right ventricular size is normal. There is normal pulmonary artery systolic pressure. The estimated right ventricular systolic pressure is 59.5 mmHg.  3. The mitral valve is normal in structure. Trivial mitral valve regurgitation. No evidence of mitral stenosis.  4. The aortic valve is tricuspid. Aortic valve regurgitation is not visualized. Mild aortic valve sclerosis is present, with no evidence of aortic valve stenosis.  5. The inferior vena cava is normal in size with greater than 50% respiratory variability, suggesting right atrial pressure of 3 mmHg. FINDINGS  Left Ventricle: Left ventricular ejection fraction, by estimation, is 55 to 60%. The left ventricle has normal function. The left ventricle has no regional wall motion abnormalities. The left ventricular internal cavity size was normal in size. There is  no left ventricular hypertrophy. Left ventricular diastolic parameters are consistent with Grade I diastolic dysfunction (impaired relaxation). Right Ventricle: The right ventricular size is normal. No increase in right ventricular wall thickness. Right ventricular systolic function is normal. There is normal pulmonary artery systolic pressure. The tricuspid regurgitant velocity is 2.69 m/s, and  with an assumed right atrial pressure of 3 mmHg, the estimated right ventricular systolic pressure is 63.8 mmHg. Left Atrium: Left atrial size was normal in size. Right Atrium:  Right atrial size was normal in size. Pericardium: Trivial pericardial effusion is present. Mitral Valve: The mitral valve is normal in structure. Mild mitral annular calcification. Trivial mitral valve regurgitation. No evidence of mitral valve stenosis. Tricuspid Valve: The tricuspid valve is normal in structure. Tricuspid valve regurgitation is trivial. Aortic Valve: The aortic valve is tricuspid. Aortic valve regurgitation  is not visualized. Mild aortic valve sclerosis is present, with no evidence of aortic valve stenosis. Aortic valve mean gradient measures 3.0 mmHg. Aortic valve peak gradient measures 6.2 mmHg. Aortic valve area, by VTI measures 2.57 cm. Pulmonic Valve: The pulmonic valve was normal in structure. Pulmonic valve regurgitation is trivial. Aorta: The aortic root is normal in size and structure. Venous: The inferior vena cava is normal in size with greater than 50% respiratory variability, suggesting right atrial pressure of 3 mmHg. IAS/Shunts: No atrial level shunt detected by color flow Doppler.  LEFT VENTRICLE PLAX 2D LVIDd:         5.70 cm     Diastology LVIDs:         2.90 cm     LV e' medial:    5.87 cm/s LV PW:         1.00 cm     LV E/e' medial:  20.4 LV IVS:        0.80 cm     LV e' lateral:   6.53 cm/s LVOT diam:     2.00 cm     LV E/e' lateral: 18.4 LV SV:         68 LV SV Index:   36 LVOT Area:     3.14 cm  LV Volumes (MOD) LV vol d, MOD A2C: 55.0 ml LV vol d, MOD A4C: 68.9 ml LV vol s, MOD A2C: 22.4 ml LV vol s, MOD A4C: 26.1 ml LV SV MOD A2C:     32.6 ml LV SV MOD A4C:     68.9 ml LV SV MOD BP:      40.3 ml RIGHT VENTRICLE RV S prime:     11.00 cm/s TAPSE (M-mode): 2.6 cm LEFT ATRIUM           Index       RIGHT ATRIUM           Index LA diam:      4.00 cm 2.13 cm/m  RA Area:     11.70 cm LA Vol (A4C): 41.4 ml 22.08 ml/m RA Volume:   25.60 ml  13.65 ml/m  AORTIC VALVE                   PULMONIC VALVE AV Area (Vmax):    2.30 cm    PV Vmax:       1.06 m/s AV Area (Vmean):   2.52  cm    PV Vmean:      80.000 cm/s AV Area (VTI):     2.57 cm    PV VTI:        0.210 m AV Vmax:           125.00 cm/s PV Peak grad:  4.5 mmHg AV Vmean:          84.600 cm/s PV Mean grad:  3.0 mmHg AV VTI:            0.264 m AV Peak Grad:      6.2 mmHg AV Mean Grad:      3.0 mmHg LVOT Vmax:         91.70 cm/s LVOT Vmean:        67.800 cm/s LVOT VTI:          0.216 m LVOT/AV VTI ratio: 0.82  AORTA Ao Root diam: 3.40 cm Ao Asc diam:  3.50 cm MITRAL VALVE                TRICUSPID  VALVE MV Area (PHT): 2.66 cm     TR Peak grad:   28.9 mmHg MV Decel Time: 285 msec     TR Vmax:        269.00 cm/s MR Peak grad: 104.9 mmHg MR Mean grad: 70.0 mmHg     SHUNTS MR Vmax:      512.00 cm/s   Systemic VTI:  0.22 m MR Vmean:     397.0 cm/s    Systemic Diam: 2.00 cm MV E velocity: 120.00 cm/s MV A velocity: 118.00 cm/s MV E/A ratio:  1.02 Loralie Champagne MD Electronically signed by Loralie Champagne MD Signature Date/Time: 08/03/2020/3:44:06 PM    Final    US THYROID  Result Date: 08/02/2020 CLINICAL DATA:  Thyroid enlargement EXAM: THYROID ULTRASOUND TECHNIQUE: Ultrasound examination of the thyroid gland and adjacent soft tissues was performed. COMPARISON:  None. FINDINGS: Parenchymal Echotexture: Mildly heterogeneous Isthmus: 0.4 cm Right lobe: 6.7 x 4.0 x 4.3 cm Left lobe: 4.0 x 2.0 x 1.1 cm _________________________________________________________ Estimated total Richmond of nodules >/= 1 cm: 3 [place] of spongiform nodules >/=  2 cm not described below (TR1): 0 [place] of mixed cystic and solid nodules >/= 1.5 cm not described below (TR2): 0 _________________________________________________________ Nodule # 1: Location: Right; mid Maximum size: 3.8 cm; Other 2 dimensions: 3.5 x 3.4 cm Composition: solid/almost completely solid (2) Echogenicity: isoechoic (1) Shape: not taller-than-wide (0) Margins: smooth (0) Echogenic foci: none (0) ACR TI-RADS total points: 3. ACR TI-RADS risk category: TR3 (3 points). ACR TI-RADS recommendations:  **Given size (>/= 2.5 cm) and appearance, fine needle aspiration of this mildly suspicious nodule should be considered based on TI-RADS criteria. _________________________________________________________ Nodule # 2: Location: Right; mid Maximum size: 2.1 cm; Other 2 dimensions: 1.5 x 1.7 cm Composition: solid/almost completely solid (2) Echogenicity: isoechoic (1) Shape: not taller-than-wide (0) Margins: smooth (0) Echogenic foci: none (0) ACR TI-RADS total points: 3. ACR TI-RADS risk category: TR3 (3 points). ACR TI-RADS recommendations: *Given size (>/= 1.5 - 2.4 cm) and appearance, a follow-up ultrasound in 1 year should be considered based on TI-RADS criteria. _________________________________________________________ Nodule # 3: Location: Right; inferior Maximum size: 2.7 cm; Other 2 dimensions: 2.5 x 2.5 cm Composition: solid/almost completely solid (2) Echogenicity: hypoechoic (2) Shape: not taller-than-wide (0) Margins: smooth (0) Echogenic foci: none (0) ACR TI-RADS total points: 4. ACR TI-RADS risk category: TR4 (4-6 points). ACR TI-RADS recommendations: **Given size (>/= 1.5 cm) and appearance, fine needle aspiration of this moderately suspicious nodule should be considered based on TI-RADS criteria. _________________________________________________________ IMPRESSION: 1. Nodule 1 (TI-RADS 3) and nodule 3 (TI-RADS 4) meets criteria for FNA. 2. Nodule 2 (TI-RADS 3) meets criteria for surveillance. Follow-up thyroid ultrasound should be performed in 1 year. The above is in keeping with the ACR TI-RADS recommendations - J Am Coll Radiol 2017;14:587-595. Electronically Signed   By: Miachel Roux M.D.   On: 08/02/2020 07:17   CT HEAD CODE STROKE WO CONTRAST  Result Date: 08/02/2020 CLINICAL DATA:  Code stroke. Unresponsive. Code stroke presentation. EXAM: CT HEAD WITHOUT CONTRAST TECHNIQUE: Contiguous axial images were obtained from the base of the skull through the vertex without intravenous contrast.  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 Cerebral hemispheres show extensive chronic small-vessel ischemic changes throughout the deep and subcortical white matter, basal ganglia and thalami. I do not identify an acute infarction, but acute infarction could be hidden within the extensive chronic changes. No mass, hemorrhage,  hydrocephalus or extra-axial collection. Vascular: There is atherosclerotic calcification of the major vessels at the base of the brain. Skull: No skull fracture. Sinuses/Orbits: Clear presently. Chronic mucoperiosteal thickening of the left maxillary sinus. Orbits negative. Other: Left posterior parietal scalp staples. Adjacent sebaceous cyst of the scalp. ASPECTS [facility] Stroke Program Early CT Score) - Ganglionic level infarction (caudate, lentiform nuclei, internal capsule, insula, M1-M3 cortex): 7 - Supraganglionic infarction (M4-M6 cortex): 3 Total score (0-10 with 10 being normal): 10 IMPRESSION: 1. No acute finding by CT. 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 Chronic calcification of the right pons. 2. ASPECTS is 10 3. Discussed by telephone with Dr. Lorrin Goodell at 1412 hours. Electronically Signed   By: Nelson Chimes M.D.   On: 08/02/2020 14:14   CT ANGIO HEAD CODE STROKE  Result Date: 08/02/2020 CLINICAL DATA:  Unresponsive EXAM: CT ANGIOGRAPHY HEAD AND NECK TECHNIQUE: Multidetector CT imaging of the head and neck was performed using the standard protocol during bolus administration of intravenous contrast. Multiplanar CT image reconstructions and MIPs were obtained to evaluate the vascular anatomy. Carotid stenosis measurements (when applicable) are obtained utilizing NASCET criteria, using the distal internal carotid diameter as the denominator. CONTRAST:  8mL OMNIPAQUE IOHEXOL 350 MG/ML SOLN COMPARISON:  None. FINDINGS: CTA NECK Aortic arch: Calcified  plaque along the arch and patent great vessel origins. Right carotid system: Patent. Common carotid is mildly displaced by enlarged thyroid. Calcified plaque at the bifurcation and proximal internal carotid with minimal stenosis. Left carotid system: Patent. Calcified plaque at the common carotid bifurcation and proximal internal carotid causing minimal stenosis. Vertebral arteries: Patent and codominant.  No stenosis. Skeleton: Advanced cervical spine degenerative changes. Degenerative changes of temporomandibular joints. Other neck: Enlarged, heterogeneous thyroid is evaluated on recent ultrasound. Endotracheal and enteric tubes are present. Upper chest: Right much greater than left pleural effusions and left lung consolidation and ground-glass density. Review of the MIP images confirms the above findings CTA HEAD Anterior circulation: The intracranial internal carotid arteries are patent with calcified plaque causing mild stenosis. Anterior and middle cerebral arteries are patent. Posterior circulation: Intracranial vertebral arteries are patent with calcified plaque causing up to moderate stenosis. Basilar artery is patent. Major cerebellar artery origins are patent. Bilateral posterior communicating arteries are present. Posterior cerebral arteries are patent. Venous sinuses: Patent as allowed by contrast bolus timing. Review of the MIP images confirms the above findings IMPRESSION: No large vessel occlusion, hemodynamically significant stenosis, or evidence of dissection. Partially imaged right much greater than left pleural effusions. Density of pleural fluid is greater than simple fluid. Left lung consolidation and ground-glass density is new from 07/31/2020. Electronically Signed   By: Macy Mis M.D.   On: 08/02/2020 14:43   CT ANGIO NECK CODE STROKE  Result Date: 08/02/2020 CLINICAL DATA:  Unresponsive EXAM: CT ANGIOGRAPHY HEAD AND NECK TECHNIQUE: Multidetector CT imaging of the head and neck was  performed using the standard protocol during bolus administration of intravenous contrast. Multiplanar CT image reconstructions and MIPs were obtained to evaluate the vascular anatomy. Carotid stenosis measurements (when applicable) are obtained utilizing NASCET criteria, using the distal internal carotid diameter as the denominator. CONTRAST:  50mL OMNIPAQUE IOHEXOL 350 MG/ML SOLN COMPARISON:  None. FINDINGS: CTA NECK Aortic arch: Calcified plaque along the arch and patent great vessel origins. Right carotid system: Patent. Common carotid is mildly displaced by enlarged thyroid. Calcified plaque at the bifurcation and proximal internal carotid with minimal stenosis. Left carotid system: Patent. Calcified plaque at the common  carotid bifurcation and proximal internal carotid causing minimal stenosis. Vertebral arteries: Patent and codominant.  No stenosis. Skeleton: Advanced cervical spine degenerative changes. Degenerative changes of temporomandibular joints. Other neck: Enlarged, heterogeneous thyroid is evaluated on recent ultrasound. Endotracheal and enteric tubes are present. Upper chest: Right much greater than left pleural effusions and left lung consolidation and ground-glass density. Review of the MIP images confirms the above findings CTA HEAD Anterior circulation: The intracranial internal carotid arteries are patent with calcified plaque causing mild stenosis. Anterior and middle cerebral arteries are patent. Posterior circulation: Intracranial vertebral arteries are patent with calcified plaque causing up to moderate stenosis. Basilar artery is patent. Major cerebellar artery origins are patent. Bilateral posterior communicating arteries are present. Posterior cerebral arteries are patent. Venous sinuses: Patent as allowed by contrast bolus timing. Review of the MIP images confirms the above findings IMPRESSION: No large vessel occlusion, hemodynamically significant stenosis, or evidence of dissection.  Partially imaged right much greater than left pleural effusions. Density of pleural fluid is greater than simple fluid. Left lung consolidation and ground-glass density is new from 07/31/2020. Electronically Signed   By: Macy Mis M.D.   On: 08/02/2020 14:43    Assessment/Plan  1. Surgical wound infection -  For wound debridement tomorrow, 08/30/20 -   Continue Bactrim DS -   Continue wound treatment daily  2. Closed fracture of tenth thoracic vertebra with delayed healing, unspecified fracture morphology, subsequent encounter -  S/P stabilization on 08/01/2020 -   Continue TLSO brace and follow-up with neurosurgery  3. Primary hypertension -  BPs stable, continue atenolol and clonidine  4. Type 2 diabetes mellitus with stage 3b chronic kidney disease, without long-term current use of insulin (HCC) Lab Results  Component Value Date   HGBA1C 5.4 08/01/2020   -   CBGs stable, continue Januvia and CBG checks  5. Chronic diastolic (congestive) heart failure (HCC) -  No SOB, continue Lasix    Family/ staff Communication: Discussed plan of care with resident and charge nurse.  Labs/tests ordered: None  Goals of care:   Short-term care   Durenda Age, DNP, MSN, FNP-BC [facility] [phone] (Monday-Friday 8:00 a.m. - 5:00 p.m.) [phone] (after hours)

## 2020-08-29 NOTE — Progress Notes (Signed)
Per Dr. Kalman Shan from anesthesia, the pt can have clear liquids until 12 noon on the day of surgery. Will relay to the pt's facility.

## 2020-08-29 NOTE — Progress Notes (Signed)
Anesthesia Chart Review: Same day workup  Pt recently hospitalized 5/10 - 08/08/2020 after falling from her porch 2 steps and striking her head.  In the ED CT of the head was nonacute. CT of the chest without contrast revealed extension injury of the thoracic spine with displaced T10 vertebral body fracture extending into the T9-T10 disc space and anterior vertebral body.  The fracture extended through the anterior osteophytes as well as the anterior superior segment of T11 vertebral body.  There was evidence of adjacent paravertebral hemorrhage and a small volume of gas, likely related to escape vacuum phenomenon.  An incidental finding was a large right pleural effusion suggesting hemorrhage.  Trauma surgeon Dr. Windle Guard consulted ; thoracentesis was to be performed 5/12 but prior to the procedure in IR the patient had acute mental status changes with acute hypoxic respiratory failure for which rapid response team was called.  Patient was unable to protect her airway and required intubation.  Code stroke was called.  The event was described as sudden unresponsiveness with stiffening of upper extremities with a distant stare.  CT of the head revealed no intracranial hemorrhage or stroke.  CTA revealed no LVO.  She received 2 g IV of Keppra as per Neurology.  Subsequently stroke was ruled out.  EEG and continuous EEG were negative for any epileptiform activity; findings were suggestive of moderate diffuse encephalopathy of nonspecific etiology possibly related to sedation.  Antiseizure medications were subsequently discontinued and neurology recommended no further workup. Once stabilized thoracentesis was performed with removal of 1800 cc of bloody fluid.  Exudative nature of the fluid was documented; abundant WBCs were present predominantly PMNs but no organisms were documented.  Procalcitonin was elevated over 16 but the leukocytosis did resolve and bacterial pneumonia was not felt to be present. Extubation was  completed 5/13 and she was transferred from the PCCM service to [facility] on 5/14. That same day the patient again went into respiratory distress requiring nonrebreather.  Etiology was acute pulmonary edema/acute on chronic diastolic congestive heart failure.  Diuresis was initiated with improvement. Creatinine peaked at 2.68 but subsequently dropped to 1.68; baseline was felt to be 1.4. Neurosurgery completed T10-11 stabilization for the unstable T10 fracture on 5/11 via posterior lateral arthrodesis.  Hx of difficult intubation due to small mouth opening. Glidescope has been used previously.   EKG 08/01/2020: NSR.  Rate 82.  TTE 08/03/2020: 1. Left ventricular ejection fraction, by estimation, is 55 to 60%. The  left ventricle has normal function. The left ventricle has no regional  wall motion abnormalities. Left ventricular diastolic parameters are  consistent with Grade I diastolic  dysfunction (impaired relaxation).  2. Right ventricular systolic function is normal. The right ventricular  size is normal. There is normal pulmonary artery systolic pressure. The  estimated right ventricular systolic pressure is 35.4 mmHg.  3. The mitral valve is normal in structure. Trivial mitral valve  regurgitation. No evidence of mitral stenosis.  4. The aortic valve is tricuspid. Aortic valve regurgitation is not  visualized. Mild aortic valve sclerosis is present, with no evidence of  aortic valve stenosis.  5. The inferior vena cava is normal in size with greater than 50%  respiratory variability, suggesting right atrial pressure of 3 mmHg.    Jacqueline Richmond [facility] Stay [facility]/Anesthesiology Phone [phone] 08/29/2020 3:07 PM

## 2020-08-29 NOTE — Pre-Procedure Instructions (Addendum)
LAINIE DAUBERT  08/29/2020      Your procedure is scheduled on Thurs., August 30, 2020 from 4:43PM-5:43PM  Report to [facility] Entrance "A" at 1:40PM  Call this number if you have problems the morning of surgery:  [phone]   Remember:  Do not eat after midnight on June 8th  You may drink clear liquids until 12 Noon day of surgery.  Clear liquids allowed are:  Water, Juice (non-citric and without pulp - diabetics please choose diet or no sugar options), Carbonated beverages - (diabetics please choose diet or no sugar options), Clear Tea, Black Coffee only (no creamer, milk or cream including half and half), Plain Jell-O only (diabetics please choose diet or no sugar options), Gatorade (diabetics please choose diet or no sugar options) and Plain Popsicles only  PLEASE PROVIDE THE EXACT TIMES FOR LAST SOLIDS AND LIQUIDS.    Take these medicines the morning of surgery with A SIP OF WATER: PLEASE PROVIDE EXACT TIMES THE MEDICINES WERE GIVEN. Allopurinol (ZYLOPRIM)  Atenolol (TENORMIN) CloNIDine (CATAPRES) Loratadine (CLARITIN)   If Needed: HYDROcodone-acetaminophen (NORCO/VICODIN) Ondansetron (ZOFRAN)   As of today, STOP taking all Aspirin (unless instructed by your doctor) and Other Aspirin containing products, Vitamins, Fish oils, and Herbal medications. Also stop all NSAIDS i.e. Advil, Ibuprofen, Motrin, Aleve, Anaprox, Naproxen, BC, Goody Powders, and all Supplements.   WHAT DO I DO ABOUT MY DIABETES MEDICATION?  Marland Kitchen Do not take SitaGLIPtin (JANUVIA) the morning of surgery.  How to Manage Your Diabetes Before and After Surgery  How do I manage my blood sugar before surgery? o Check your blood sugar the morning of your surgery when you wake up and every 2 hours until you get to the Short Stay unit. . If your blood sugar is less than 70 mg/dL, you will need to treat for low blood sugar: o Do not take insulin. o Treat a low blood sugar (less than 70 mg/dL) with  cup of  clear juice (cranberry or apple), 4 glucose tablets, OR glucose gel. Recheck blood sugar in 15 minutes after treatment (to make sure it is greater than 70 mg/dL). If your blood sugar is not greater than 70 mg/dL on recheck, call [phone] o  for further instructions.  . If your CBG is greater than 220 mg/dL, call the number above for further instructions.  Reviewed and Endorsed by [facility] Patient Education Committee, August 2015   No Smoking of any kind, Tobacco/Vaping, or Alcohol products 24 hours prior to your procedure. If you use a Cpap at night, you may bring all equipment for your overnight stay.    Day of Surgery:  Do not wear jewelry, make-up or nail polish.  Do not wear lotions, powders, or perfumes, or deodorant.  Do not shave 48 hours prior to surgery.  Men may shave face and neck.  Do not bring valuables to the hospital.  [facility] is not responsible for any belongings or valuables.  Contacts, dentures or bridgework may not be worn into surgery.    For patients admitted to the hospital, discharge time will be determined by your treatment team.  Patients discharged the day of surgery will not be allowed to drive home, and someone age 43 and over needs to stay with them for 24 hours.   Special instructions:  [facility]- Preparing For Surgery  Before surgery, you can play an important role. Because skin is not sterile, your skin needs to be as free of germs as  possible. You can reduce the number of germs on your skin by washing with Soap before surgery.    Oral Hygiene is also important to reduce your risk of infection.  Remember - BRUSH YOUR TEETH THE MORNING OF SURGERY WITH YOUR REGULAR TOOTHPASTE  Reminders: Do not apply any deodorants/lotions.  Please wear clean clothes to the hospital/surgery center.   Remember to brush your teeth WITH YOUR REGULAR TOOTHPASTE.

## 2020-08-29 NOTE — Progress Notes (Addendum)
Spoke with the daughter-in-law [name]- [phone] to go over some of the pre-op information, and she sts she is the person of contact for the pt. Nathaneil Canary the pt currently resides at [place], fx: [phone], and to relay the other information to the nurse. Informed Jocelyn from [place] of the pt's surgery and faxed pre-op instructions. Jocelyn confirmed receipt of the instructions.

## 2020-08-29 NOTE — Anesthesia Preprocedure Evaluation (Addendum)
Anesthesia Evaluation  Patient identified by MRN, date of birth, ID band Patient awake    Reviewed: Allergy & Precautions, NPO status , Patient's Chart, lab work & pertinent test results, reviewed documented beta blocker date and time   History of Anesthesia Complications (+) DIFFICULT AIRWAY and history of anesthetic complications  Airway Mallampati: IV  TM Distance: >3 FB Neck ROM: Full  Mouth opening: Limited Mouth Opening  Dental  (+) Dental Advisory Given, Teeth Intact   Pulmonary neg pulmonary ROS,    Pulmonary exam normal breath sounds clear to auscultation       Cardiovascular hypertension, Pt. on home beta blockers and Pt. on medications Normal cardiovascular exam Rhythm:Regular Rate:Normal     Neuro/Psych negative neurological ROS     GI/Hepatic negative GI ROS, Neg liver ROS,   Endo/Other  negative endocrine ROSdiabetes  Renal/GU Renal disease     Musculoskeletal  (+) Arthritis ,   Abdominal   Peds  Hematology  (+) Blood dyscrasia, anemia ,   Anesthesia Other Findings   Reproductive/Obstetrics                                                            Anesthesia Evaluation  Patient identified by MRN, date of birth, ID band Patient awake    Reviewed: Allergy & Precautions, H&P , NPO status , Patient's Chart, lab work & pertinent test results  History of Anesthesia Complications (+) DIFFICULT AIRWAY and history of anesthetic complications  Airway Mallampati: III   Neck ROM: full  Mouth opening: Limited Mouth Opening  Dental   Pulmonary  Right side pleural effusion (hemothorax?)   breath sounds clear to auscultation       Cardiovascular hypertension,  Rhythm:regular Rate:Normal     Neuro/Psych T10 fx s/p fall at home    GI/Hepatic   Endo/Other  diabetes, Type 2  Renal/GU Renal InsufficiencyRenal disease     Musculoskeletal  (+) Arthritis ,    Abdominal   Peds  Hematology   Anesthesia Other Findings   Reproductive/Obstetrics                            Anesthesia Physical Anesthesia Plan  ASA: III  Anesthesia Plan: General   Post-op Pain Management:    Induction: Intravenous  PONV Risk Score and Plan: 3 and Ondansetron, Dexamethasone and Treatment may vary due to age or medical condition  Airway Management Planned: Video Laryngoscope Planned and Oral ETT  Additional Equipment:   Intra-op Plan:   Post-operative Plan: Extubation in OR  Informed Consent: I have reviewed the patients History and Physical, chart, labs and discussed the procedure including the risks, benefits and alternatives for the proposed anesthesia with the patient or authorized representative who has indicated his/her understanding and acceptance.     Dental advisory given  Plan Discussed with: CRNA, Anesthesiologist and Surgeon  Anesthesia Plan Comments:         Anesthesia Quick Evaluation                                   Anesthesia Evaluation  Patient identified by MRN, date of birth, ID band Patient awake    Reviewed: Allergy & Precautions,  NPO status , Patient's Chart, lab work & pertinent test results  History of Anesthesia Complications (+) DIFFICULT AIRWAY  Airway Mallampati: IV  TM Distance: <3 FB Neck ROM: Limited  Mouth opening: Limited Mouth Opening  Dental  (+) Dental Advisory Given   Pulmonary neg pulmonary ROS,    breath sounds clear to auscultation       Cardiovascular hypertension, Pt. on medications and Pt. on home beta blockers  Rhythm:Regular Rate:Normal     Neuro/Psych negative neurological ROS     GI/Hepatic negative GI ROS, Neg liver ROS,   Endo/Other  diabetes, Type 2, Oral Hypoglycemic Agents  Renal/GU Renal disease     Musculoskeletal  (+) Arthritis ,   Abdominal   Peds  Hematology negative hematology ROS (+)   Anesthesia Other  Findings   Reproductive/Obstetrics                            Anesthesia Physical Anesthesia Plan  ASA: II  Anesthesia Plan: MAC   Post-op Pain Management:    Induction: Intravenous  PONV Risk Score and Plan: 2 and Propofol infusion, Ondansetron and Treatment may vary due to age or medical condition  Airway Management Planned: Natural Airway and Simple Face Mask  Additional Equipment:   Intra-op Plan:   Post-operative Plan:   Informed Consent: I have reviewed the patients History and Physical, chart, labs and discussed the procedure including the risks, benefits and alternatives for the proposed anesthesia with the patient or authorized representative who has indicated his/her understanding and acceptance.     Dental advisory given  Plan Discussed with: CRNA  Anesthesia Plan Comments:        Anesthesia Quick Evaluation  Anesthesia Physical Anesthesia Plan  ASA: III  Anesthesia Plan: General   Post-op Pain Management:    Induction: Intravenous  PONV Risk Score and Plan: 4 or greater and Ondansetron, Dexamethasone, Treatment may vary due to age or medical condition and Diphenhydramine  Airway Management Planned: Oral ETT and Video Laryngoscope Planned  Additional Equipment: None  Intra-op Plan:   Post-operative Plan: Possible Post-op intubation/ventilation  Informed Consent: I have reviewed the patients History and Physical, chart, labs and discussed the procedure including the risks, benefits and alternatives for the proposed anesthesia with the patient or authorized representative who has indicated his/her understanding and acceptance.     Dental advisory given  Plan Discussed with: CRNA  Anesthesia Plan Comments: (PAT note by Karoline Caldwell, PA-C: Pt recently hospitalized 5/10 - 08/08/2020 after falling from her porch 2 steps and striking her head.  In the ED CT of the head was nonacute. CT of the chest without contrast  revealed extension injury of the thoracic spine with displaced T10 vertebral body fracture extending into the T9-T10 disc space and anterior vertebral body.  The fracture extended through the anterior osteophytes as well as the anterior superior segment of T11 vertebral body.  There was evidence of adjacent paravertebral hemorrhage and a small volume of gas, likely related to escape vacuum phenomenon.  An incidental finding was a large right pleural effusion suggesting hemorrhage.  Trauma surgeon Dr. Windle Guard consulted ; thoracentesis was to be performed 5/12 but prior to the procedure in IR the patient had acute mental status changes with acute hypoxic respiratory failure for which rapid response team was called.  Patient was unable to protect her airway and required intubation.  Code stroke was called.  The event was described as sudden unresponsiveness  with stiffening of upper extremities with a distant stare.  CT of the head revealed no intracranial hemorrhage or stroke.  CTA revealed no LVO.  She received 2 g IV of Keppra as per Neurology.  Subsequently stroke was ruled out.  EEG and continuous EEG were negative for any epileptiform activity; findings were suggestive of moderate diffuse encephalopathy of nonspecific etiology possibly related to sedation.  Antiseizure medications were subsequently discontinued and neurology recommended no further workup. Once stabilized thoracentesis was performed with removal of 1800 cc of bloody fluid.  Exudative nature of the fluid was documented; abundant WBCs were present predominantly PMNs but no organisms were documented.  Procalcitonin was elevated over 16 but the leukocytosis did resolve and bacterial pneumonia was not felt to be present. Extubation was completed 5/13 and she was transferred from the PCCM service to [facility] on 5/14. That same day the patient again went into respiratory distress requiring nonrebreather.  Etiology was acute pulmonary edema/acute on chronic  diastolic congestive heart failure.  Diuresis was initiated with improvement. Creatinine peaked at 2.68 but subsequently dropped to 1.68; baseline was felt to be 1.4. Neurosurgery completed T10-11 stabilization for the unstable T10 fracture on 5/11 via posterior lateral arthrodesis.  Hx of difficult intubation due to small mouth opening. Glidescope has been used previously.   EKG 08/01/2020: NSR.  Rate 82.  TTE 08/03/2020: 1. Left ventricular ejection fraction, by estimation, is 55 to 60%. The  left ventricle has normal function. The left ventricle has no regional  wall motion abnormalities. Left ventricular diastolic parameters are  consistent with Grade I diastolic  dysfunction (impaired relaxation).  2. Right ventricular systolic function is normal. The right ventricular  size is normal. There is normal pulmonary artery systolic pressure. The  estimated right ventricular systolic pressure is 26.8 mmHg.  3. The mitral valve is normal in structure. Trivial mitral valve  regurgitation. No evidence of mitral stenosis.  4. The aortic valve is tricuspid. Aortic valve regurgitation is not  visualized. Mild aortic valve sclerosis is present, with no evidence of  aortic valve stenosis.  5. The inferior vena cava is normal in size with greater than 50%  respiratory variability, suggesting right atrial pressure of 3 mmHg.   )       Anesthesia Quick Evaluation

## 2020-08-30 ENCOUNTER — Ambulatory Visit (HOSPITAL_COMMUNITY): Payer: Medicare PPO | Admitting: Physician Assistant

## 2020-08-30 ENCOUNTER — Observation Stay (HOSPITAL_COMMUNITY)
Admission: RE | Admit: 2020-08-30 | Discharge: 2020-09-05 | Disposition: A | Discharge status: Home / Self Care | Payer: Medicare PPO | Attending: Neurosurgery | Admitting: Neurosurgery

## 2020-08-30 ENCOUNTER — Encounter (HOSPITAL_COMMUNITY)
Admission: RE | Disposition: A | Discharge status: Home / Self Care | Payer: Self-pay | Source: Home / Self Care | Attending: Neurosurgery

## 2020-08-30 ENCOUNTER — Encounter (HOSPITAL_COMMUNITY): Payer: Self-pay | Admitting: Neurosurgery

## 2020-08-30 DIAGNOSIS — T8141XA Infection following a procedure, superficial incisional surgical site, initial encounter: Secondary | ICD-10-CM | POA: Insufficient documentation

## 2020-08-30 DIAGNOSIS — Z20822 Contact with and (suspected) exposure to covid-19: Secondary | ICD-10-CM | POA: Diagnosis not present

## 2020-08-30 DIAGNOSIS — Y828 Other medical devices associated with adverse incidents: Secondary | ICD-10-CM | POA: Diagnosis not present

## 2020-08-30 DIAGNOSIS — T8132XA Disruption of internal operation (surgical) wound, not elsewhere classified, initial encounter: Principal | ICD-10-CM | POA: Insufficient documentation

## 2020-08-30 DIAGNOSIS — N183 Chronic kidney disease, stage 3 unspecified: Secondary | ICD-10-CM | POA: Diagnosis not present

## 2020-08-30 DIAGNOSIS — Z79899 Other long term (current) drug therapy: Secondary | ICD-10-CM | POA: Diagnosis not present

## 2020-08-30 DIAGNOSIS — T8130XA Disruption of wound, unspecified, initial encounter: Secondary | ICD-10-CM | POA: Diagnosis present

## 2020-08-30 DIAGNOSIS — Z96641 Presence of right artificial hip joint: Secondary | ICD-10-CM | POA: Diagnosis not present

## 2020-08-30 DIAGNOSIS — I129 Hypertensive chronic kidney disease with stage 1 through stage 4 chronic kidney disease, or unspecified chronic kidney disease: Secondary | ICD-10-CM | POA: Insufficient documentation

## 2020-08-30 DIAGNOSIS — Z7984 Long term (current) use of oral hypoglycemic drugs: Secondary | ICD-10-CM | POA: Insufficient documentation

## 2020-08-30 DIAGNOSIS — E1122 Type 2 diabetes mellitus with diabetic chronic kidney disease: Secondary | ICD-10-CM | POA: Diagnosis not present

## 2020-08-30 HISTORY — PX: APPLICATION OF WOUND VAC: SHX5189

## 2020-08-30 HISTORY — PX: WOUND EXPLORATION: SHX6188

## 2020-08-30 LAB — CBC
HCT: 30.3 % — ABNORMAL LOW (ref 36.0–46.0)
HCT: 30.6 % — ABNORMAL LOW (ref 36.0–46.0)
Hemoglobin: 9.1 g/dL — ABNORMAL LOW (ref 12.0–15.0)
Hemoglobin: 9.3 g/dL — ABNORMAL LOW (ref 12.0–15.0)
MCH: 26.5 pg (ref 26.0–34.0)
MCH: 26.3 pg (ref 26.0–34.0)
MCHC: 30 g/dL (ref 30.0–36.0)
MCHC: 30.4 g/dL (ref 30.0–36.0)
MCV: 88.1 fL (ref 80.0–100.0)
MCV: 86.7 fL (ref 80.0–100.0)
Platelets: 265 10*3/uL (ref 150–400)
Platelets: 267 10*3/uL (ref 150–400)
RBC: 3.44 MIL/uL — ABNORMAL LOW (ref 3.87–5.11)
RBC: 3.53 MIL/uL — ABNORMAL LOW (ref 3.87–5.11)
RDW: 16.5 % — ABNORMAL HIGH (ref 11.5–15.5)
RDW: 16.6 % — ABNORMAL HIGH (ref 11.5–15.5)
WBC: 9.8 10*3/uL (ref 4.0–10.5)
WBC: 8.7 10*3/uL (ref 4.0–10.5)
nRBC: 0 % (ref 0.0–0.2)
nRBC: 0 % (ref 0.0–0.2)

## 2020-08-30 LAB — BASIC METABOLIC PANEL
Anion gap: 10 (ref 5–15)
BUN: 52 mg/dL — ABNORMAL HIGH (ref 8–23)
CO2: 20 mmol/L — ABNORMAL LOW (ref 22–32)
Calcium: 8.6 mg/dL — ABNORMAL LOW (ref 8.9–10.3)
Chloride: 101 mmol/L (ref 98–111)
Creatinine, Ser: 3.3 mg/dL — ABNORMAL HIGH (ref 0.44–1.00)
GFR, Estimated: 14 mL/min — ABNORMAL LOW (ref >60)
Glucose, Bld: 93 mg/dL (ref 70–99)
Potassium: 5.1 mmol/L (ref 3.5–5.1)
Sodium: 131 mmol/L — ABNORMAL LOW (ref 135–145)

## 2020-08-30 LAB — GLUCOSE, CAPILLARY
Glucose-Capillary: 90 mg/dL (ref 70–99)
Glucose-Capillary: 82 mg/dL (ref 70–99)
Glucose-Capillary: 92 mg/dL (ref 70–99)
Glucose-Capillary: 104 mg/dL — ABNORMAL HIGH (ref 70–99)

## 2020-08-30 LAB — SARS CORONAVIRUS 2 BY RT PCR (HOSPITAL ORDER, PERFORMED IN ~~LOC~~ HOSPITAL LAB): SARS Coronavirus 2: NEGATIVE

## 2020-08-30 LAB — CREATININE, SERUM
Creatinine, Ser: 3.15 mg/dL — ABNORMAL HIGH (ref 0.44–1.00)
GFR, Estimated: 15 mL/min — ABNORMAL LOW (ref >60)

## 2020-08-30 SURGERY — WOUND EXPLORATION
Anesthesia: General

## 2020-08-30 MED ORDER — INSULIN ASPART 100 UNIT/ML IJ SOLN
0.0000 [IU] | Freq: Three times a day (TID) | INTRAMUSCULAR | Status: DC
  Administered 2020-09-01: 2 [IU] via SUBCUTANEOUS

## 2020-08-30 MED ORDER — PHENYLEPHRINE HCL-NACL 10-0.9 MG/250ML-% IV SOLN
INTRAVENOUS | Status: DC | PRN
  Administered 2020-08-30: 50 ug/min via INTRAVENOUS

## 2020-08-30 MED ORDER — B COMPLEX-C PO TABS
1.0000 | ORAL_TABLET | Freq: Every day | ORAL | Status: DC
  Administered 2020-08-31 – 2020-09-04 (×5): 1 via ORAL
  Filled 2020-08-30 (×5): qty 1

## 2020-08-30 MED ORDER — LIDOCAINE HCL (CARDIAC) PF 100 MG/5ML IV SOSY
PREFILLED_SYRINGE | INTRAVENOUS | Status: DC | PRN
  Administered 2020-08-30: 60 mg via INTRAVENOUS

## 2020-08-30 MED ORDER — ONDANSETRON HCL 4 MG PO TABS: 4.0000 mg | ORAL_TABLET | Freq: Three times a day (TID) | ORAL | Status: DC | PRN

## 2020-08-30 MED ORDER — OMEGA-3 1000 MG PO CAPS: 1000.0000 mg | ORAL_CAPSULE | Freq: Two times a day (BID) | ORAL | Status: DC

## 2020-08-30 MED ORDER — METHOCARBAMOL 500 MG PO TABS
500.0000 mg | ORAL_TABLET | Freq: Four times a day (QID) | ORAL | Status: DC | PRN
  Administered 2020-08-31 – 2020-09-03 (×4): 500 mg via ORAL
  Filled 2020-08-30 (×5): qty 1

## 2020-08-30 MED ORDER — FENTANYL CITRATE (PF) 100 MCG/2ML IJ SOLN
25.0000 ug | INTRAMUSCULAR | Status: DC | PRN
  Administered 2020-08-30 (×2): 25 ug via INTRAVENOUS

## 2020-08-30 MED ORDER — HYDROCODONE-ACETAMINOPHEN 5-325 MG PO TABS
2.0000 | ORAL_TABLET | Freq: Four times a day (QID) | ORAL | Status: DC | PRN
Start: 2020-08-30 — End: 2020-09-05
  Administered 2020-08-30 – 2020-09-04 (×11): 2 via ORAL
  Filled 2020-08-30 (×11): qty 2

## 2020-08-30 MED ORDER — PROPOFOL 10 MG/ML IV BOLUS
INTRAVENOUS | Status: DC | PRN
  Administered 2020-08-30: 100 mg via INTRAVENOUS

## 2020-08-30 MED ORDER — LINAGLIPTIN 5 MG PO TABS: 5.0000 mg | ORAL_TABLET | Freq: Every day | ORAL | Status: DC

## 2020-08-30 MED ORDER — ACETAMINOPHEN 325 MG PO TABS
650.0000 mg | ORAL_TABLET | ORAL | Status: DC | PRN
  Administered 2020-09-01: 650 mg via ORAL
  Filled 2020-08-30: qty 2

## 2020-08-30 MED ORDER — LORATADINE 10 MG PO TABS
10.0000 mg | ORAL_TABLET | Freq: Every morning | ORAL | Status: DC
  Administered 2020-08-31 – 2020-09-04 (×5): 10 mg via ORAL
  Filled 2020-08-30 (×4): qty 1

## 2020-08-30 MED ORDER — MAGNESIUM HYDROXIDE 400 MG/5ML PO SUSP: 30.0000 mL | Freq: Every day | ORAL | Status: DC | PRN

## 2020-08-30 MED ORDER — CHLORHEXIDINE GLUCONATE CLOTH 2 % EX PADS: 6.0000 | MEDICATED_PAD | Freq: Once | CUTANEOUS | Status: DC

## 2020-08-30 MED ORDER — HEPARIN SODIUM (PORCINE) 5000 UNIT/ML IJ SOLN: 5000.0000 [IU] | Freq: Three times a day (TID) | INTRAMUSCULAR | Status: DC

## 2020-08-30 MED ORDER — DEXAMETHASONE SODIUM PHOSPHATE 4 MG/ML IJ SOLN
INTRAMUSCULAR | Status: DC | PRN
  Administered 2020-08-30: 4 mg via INTRAVENOUS

## 2020-08-30 MED ORDER — BENZONATATE 100 MG PO CAPS
100.0000 mg | ORAL_CAPSULE | Freq: Three times a day (TID) | ORAL | Status: DC | PRN
  Administered 2020-08-31 – 2020-09-03 (×2): 100 mg via ORAL
  Filled 2020-08-30 (×4): qty 1

## 2020-08-30 MED ORDER — LACTATED RINGERS IV SOLN: INTRAVENOUS | Status: DC

## 2020-08-30 MED ORDER — MENTHOL 3 MG MT LOZG: 1.0000 | LOZENGE | OROMUCOSAL | Status: DC | PRN

## 2020-08-30 MED ORDER — GLIPIZIDE 5 MG PO TABS
5.0000 mg | ORAL_TABLET | Freq: Every day | ORAL | Status: DC
  Administered 2020-08-31 – 2020-09-03 (×3): 5 mg via ORAL
  Filled 2020-08-30 (×3): qty 1

## 2020-08-30 MED ORDER — PANTOPRAZOLE SODIUM 40 MG PO TBEC
40.0000 mg | DELAYED_RELEASE_TABLET | Freq: Every day | ORAL | Status: DC
  Administered 2020-08-30 – 2020-09-04 (×6): 40 mg via ORAL
  Filled 2020-08-30 (×6): qty 1

## 2020-08-30 MED ORDER — EPHEDRINE SULFATE 50 MG/ML IJ SOLN
INTRAMUSCULAR | Status: DC | PRN
  Administered 2020-08-30: 25 mg via INTRAVENOUS
  Administered 2020-08-30: 15 mg via INTRAVENOUS

## 2020-08-30 MED ORDER — PROPOFOL 10 MG/ML IV BOLUS
INTRAVENOUS | Status: AC
  Filled 2020-08-30: qty 20

## 2020-08-30 MED ORDER — 0.9 % SODIUM CHLORIDE (POUR BTL) OPTIME
TOPICAL | Status: DC | PRN
  Administered 2020-08-30: 1000 mL

## 2020-08-30 MED ORDER — FUROSEMIDE 40 MG PO TABS
40.0000 mg | ORAL_TABLET | Freq: Every morning | ORAL | Status: DC
  Administered 2020-08-31 – 2020-09-04 (×5): 40 mg via ORAL
  Filled 2020-08-30 (×5): qty 1

## 2020-08-30 MED ORDER — FERROUS SULFATE 325 (65 FE) MG PO TABS
325.0000 mg | ORAL_TABLET | Freq: Every day | ORAL | Status: DC
  Administered 2020-08-31 – 2020-09-04 (×5): 325 mg via ORAL
  Filled 2020-08-30 (×5): qty 1

## 2020-08-30 MED ORDER — PHENOL 1.4 % MT LIQD: 1.0000 | OROMUCOSAL | Status: DC | PRN

## 2020-08-30 MED ORDER — FENTANYL CITRATE (PF) 100 MCG/2ML IJ SOLN
INTRAMUSCULAR | Status: DC | PRN
  Administered 2020-08-30: 100 ug via INTRAVENOUS

## 2020-08-30 MED ORDER — ONDANSETRON HCL 4 MG/2ML IJ SOLN
INTRAMUSCULAR | Status: DC | PRN
  Administered 2020-08-30: 4 mg via INTRAVENOUS

## 2020-08-30 MED ORDER — DROPERIDOL 2.5 MG/ML IJ SOLN: 0.6250 mg | Freq: Once | INTRAMUSCULAR | Status: DC | PRN

## 2020-08-30 MED ORDER — CHLORHEXIDINE GLUCONATE 0.12 % MT SOLN
OROMUCOSAL | Status: AC
  Administered 2020-08-30: 15 mL via OROMUCOSAL
  Filled 2020-08-30: qty 15

## 2020-08-30 MED ORDER — FENTANYL CITRATE (PF) 250 MCG/5ML IJ SOLN
INTRAMUSCULAR | Status: AC
  Filled 2020-08-30: qty 5

## 2020-08-30 MED ORDER — ACETAMINOPHEN 650 MG RE SUPP: 650.0000 mg | RECTAL | Status: DC | PRN

## 2020-08-30 MED ORDER — FENTANYL CITRATE (PF) 100 MCG/2ML IJ SOLN
INTRAMUSCULAR | Status: AC
  Filled 2020-08-30: qty 2

## 2020-08-30 MED ORDER — SODIUM CHLORIDE 0.9% FLUSH
3.0000 mL | Freq: Two times a day (BID) | INTRAVENOUS | Status: DC
  Administered 2020-08-31 – 2020-09-04 (×10): 3 mL via INTRAVENOUS

## 2020-08-30 MED ORDER — CHLORHEXIDINE GLUCONATE 0.12 % MT SOLN
15.0000 mL | OROMUCOSAL | Status: AC
  Filled 2020-08-30: qty 15

## 2020-08-30 MED ORDER — CEFAZOLIN SODIUM-DEXTROSE 2-4 GM/100ML-% IV SOLN
2.0000 g | INTRAVENOUS | Status: AC
  Administered 2020-08-30: 2 g via INTRAVENOUS
  Filled 2020-08-30: qty 100

## 2020-08-30 MED ORDER — SENNA 8.6 MG PO TABS
1.0000 | ORAL_TABLET | Freq: Two times a day (BID) | ORAL | Status: DC
  Administered 2020-08-30 – 2020-09-04 (×7): 8.6 mg via ORAL
  Filled 2020-08-30 (×8): qty 1

## 2020-08-30 MED ORDER — PANTOPRAZOLE SODIUM 40 MG IV SOLR: 40.0000 mg | Freq: Every day | INTRAVENOUS | Status: DC

## 2020-08-30 MED ORDER — INSULIN ASPART 100 UNIT/ML IJ SOLN: 0.0000 [IU] | Freq: Every day | INTRAMUSCULAR | Status: DC

## 2020-08-30 MED ORDER — CLONIDINE HCL 0.2 MG PO TABS
0.2000 mg | ORAL_TABLET | Freq: Two times a day (BID) | ORAL | Status: DC
  Administered 2020-08-30 – 2020-09-04 (×11): 0.2 mg via ORAL
  Filled 2020-08-30 (×12): qty 1

## 2020-08-30 MED ORDER — SODIUM CHLORIDE 0.9% FLUSH: 3.0000 mL | INTRAVENOUS | Status: DC | PRN

## 2020-08-30 MED ORDER — METHOCARBAMOL 1000 MG/10ML IJ SOLN
500.0000 mg | Freq: Four times a day (QID) | INTRAVENOUS | Status: DC | PRN
  Filled 2020-08-30 (×2): qty 5

## 2020-08-30 MED ORDER — B COMPLEX PO TABS: 1.0000 | ORAL_TABLET | Freq: Every morning | ORAL | Status: DC

## 2020-08-30 MED ORDER — ALLOPURINOL 300 MG PO TABS
150.0000 mg | ORAL_TABLET | Freq: Every morning | ORAL | Status: DC
  Administered 2020-08-31 – 2020-09-04 (×5): 150 mg via ORAL
  Filled 2020-08-30: qty 1
  Filled 2020-08-30 (×2): qty 0.5
  Filled 2020-08-30 (×2): qty 1

## 2020-08-30 MED ORDER — SODIUM CHLORIDE 0.9 % IV SOLN: 250.0000 mL | INTRAVENOUS | Status: DC

## 2020-08-30 MED ORDER — HEPARIN SODIUM (PORCINE) 5000 UNIT/ML IJ SOLN
5000.0000 [IU] | Freq: Three times a day (TID) | INTRAMUSCULAR | Status: DC
  Administered 2020-08-31 – 2020-09-04 (×13): 5000 [IU] via SUBCUTANEOUS
  Filled 2020-08-30 (×12): qty 1

## 2020-08-30 MED ORDER — CEFAZOLIN SODIUM-DEXTROSE 2-4 GM/100ML-% IV SOLN
2.0000 g | Freq: Two times a day (BID) | INTRAVENOUS | Status: AC
  Administered 2020-08-31: 2 g via INTRAVENOUS
  Filled 2020-08-30: qty 100

## 2020-08-30 MED ORDER — ONDANSETRON HCL 4 MG/2ML IJ SOLN
4.0000 mg | Freq: Four times a day (QID) | INTRAMUSCULAR | Status: DC | PRN
  Administered 2020-08-30 – 2020-08-31 (×2): 4 mg via INTRAVENOUS
  Filled 2020-08-30 (×3): qty 2

## 2020-08-30 MED ORDER — ATENOLOL 50 MG PO TABS
100.0000 mg | ORAL_TABLET | Freq: Every morning | ORAL | Status: DC
  Administered 2020-08-31 – 2020-09-04 (×5): 100 mg via ORAL
  Filled 2020-08-30 (×5): qty 2

## 2020-08-30 MED ORDER — OMEGA-3-ACID ETHYL ESTERS 1 G PO CAPS
1.0000 g | ORAL_CAPSULE | Freq: Two times a day (BID) | ORAL | Status: DC
  Administered 2020-08-30 – 2020-09-04 (×11): 1 g via ORAL
  Filled 2020-08-30 (×11): qty 1

## 2020-08-30 MED ORDER — SULFAMETHOXAZOLE-TRIMETHOPRIM 800-160 MG PO TABS: 2.0000 | ORAL_TABLET | Freq: Two times a day (BID) | ORAL | Status: DC

## 2020-08-30 MED ORDER — ROCURONIUM BROMIDE 100 MG/10ML IV SOLN
INTRAVENOUS | Status: DC | PRN
  Administered 2020-08-30: 30 mg via INTRAVENOUS

## 2020-08-30 MED ORDER — SUGAMMADEX SODIUM 200 MG/2ML IV SOLN
INTRAVENOUS | Status: DC | PRN
  Administered 2020-08-30: 200 mg via INTRAVENOUS

## 2020-08-30 MED ORDER — DOCUSATE SODIUM 100 MG PO CAPS
100.0000 mg | ORAL_CAPSULE | Freq: Two times a day (BID) | ORAL | Status: DC
  Administered 2020-08-30 – 2020-09-04 (×7): 100 mg via ORAL
  Filled 2020-08-30 (×9): qty 1

## 2020-08-30 MED ORDER — SODIUM CHLORIDE 0.9 % IV SOLN: INTRAVENOUS | Status: DC

## 2020-08-30 MED ORDER — ONDANSETRON HCL 4 MG PO TABS: 4.0000 mg | ORAL_TABLET | Freq: Four times a day (QID) | ORAL | Status: DC | PRN

## 2020-08-30 MED ORDER — BISACODYL 10 MG RE SUPP: 10.0000 mg | RECTAL | Status: DC | PRN

## 2020-08-30 SURGICAL SUPPLY — 44 items
CANISTER SUCT 3000ML PPV (MISCELLANEOUS) ×4 IMPLANT
CANISTER WOUND CARE 500ML ATS (WOUND CARE) ×1 IMPLANT
CARTRIDGE OIL MAESTRO DRILL (MISCELLANEOUS) ×1 IMPLANT
COVER WAND RF STERILE (DRAPES) ×4 IMPLANT
DIFFUSER DRILL AIR PNEUMATIC (MISCELLANEOUS) ×1 IMPLANT
DRAPE LAPAROTOMY 100X72X124 (DRAPES) ×2 IMPLANT
DRAPE LAPAROTOMY T 102X78X121 (DRAPES) ×2 IMPLANT
DRSG VAC ATS MED SENSATRAC (GAUZE/BANDAGES/DRESSINGS) ×1 IMPLANT
DRSG VAC ATS SM SENSATRAC (GAUZE/BANDAGES/DRESSINGS) ×1 IMPLANT
ELECT REM PT RETURN 9FT ADLT (ELECTROSURGICAL) ×2
ELECTRODE REM PT RTRN 9FT ADLT (ELECTROSURGICAL) ×1 IMPLANT
GAUZE 4X4 16PLY RFD (DISPOSABLE) IMPLANT
GAUZE SPONGE 4X4 12PLY STRL (GAUZE/BANDAGES/DRESSINGS) IMPLANT
GLOVE BIO SURGEON STRL SZ7 (GLOVE) IMPLANT
GLOVE BIO SURGEON STRL SZ7.5 (GLOVE) IMPLANT
GLOVE BIO SURGEON STRL SZ8 (GLOVE) ×3 IMPLANT
GLOVE BIOGEL PI IND STRL 7.5 (GLOVE) ×2 IMPLANT
GLOVE BIOGEL PI INDICATOR 7.5 (GLOVE) ×2
GLOVE ECLIPSE 7.0 STRL STRAW (GLOVE) ×2 IMPLANT
GLOVE EXAM NITRILE XL STR (GLOVE) IMPLANT
GLOVE SRG 8 PF TXTR STRL LF DI (GLOVE) IMPLANT
GLOVE SURG UNDER POLY LF SZ7 (GLOVE) IMPLANT
GLOVE SURG UNDER POLY LF SZ8 (GLOVE) ×4
GOWN STRL REUS W/ TWL LRG LVL3 (GOWN DISPOSABLE) ×1 IMPLANT
GOWN STRL REUS W/ TWL XL LVL3 (GOWN DISPOSABLE) IMPLANT
GOWN STRL REUS W/TWL 2XL LVL3 (GOWN DISPOSABLE) IMPLANT
GOWN STRL REUS W/TWL LRG LVL3 (GOWN DISPOSABLE) ×2
GOWN STRL REUS W/TWL XL LVL3 (GOWN DISPOSABLE)
KIT BASIN OR (CUSTOM PROCEDURE TRAY) ×4 IMPLANT
KIT TURNOVER KIT B (KITS) ×2 IMPLANT
NEEDLE HYPO 22GX1.5 SAFETY (NEEDLE) ×2 IMPLANT
NS IRRIG 1000ML POUR BTL (IV SOLUTION) ×4 IMPLANT
OIL CARTRIDGE MAESTRO DRILL (MISCELLANEOUS)
PACK LAMINECTOMY NEURO (CUSTOM PROCEDURE TRAY) ×4 IMPLANT
PAD ARMBOARD 7.5X6 YLW CONV (MISCELLANEOUS) ×6 IMPLANT
SPONGE SURGIFOAM ABS GEL SZ50 (HEMOSTASIS) IMPLANT
SUT VIC AB 0 CT1 18XCR BRD8 (SUTURE) ×1 IMPLANT
SUT VIC AB 0 CT1 8-18 (SUTURE) ×2
SUT VICRYL 3-0 RB1 18 ABS (SUTURE) ×4 IMPLANT
SWAB COLLECTION DEVICE MRSA (MISCELLANEOUS) IMPLANT
SWAB CULTURE ESWAB REG 1ML (MISCELLANEOUS) IMPLANT
TOWEL GREEN STERILE (TOWEL DISPOSABLE) ×4 IMPLANT
TOWEL GREEN STERILE FF (TOWEL DISPOSABLE) ×4 IMPLANT
WATER STERILE IRR 1000ML POUR (IV SOLUTION) ×4 IMPLANT

## 2020-08-30 NOTE — Progress Notes (Signed)
Patient's husband Jori Moll updated via phone.

## 2020-08-30 NOTE — H&P (Signed)
Chief Complaint   Wound infection   History of Present Illness  Jacqueline Richmond is a 75 y.o. female who previously underwent percutaneous instrumented stabilization of a T10 fracture.  She was seen in the outpatient neurosurgery clinic with wound dehiscence and a significant amount of eschar on her wound.  Attempt was made to treat her conservatively with antibiotics without success.  She therefore presents for wound debridement and placement of a wound VAC.  Past Medical History   Past Medical History:  Diagnosis Date   Allergy    Anemia    Arthritis    Cataract    biltaerally    CKD (chronic kidney disease) stage 3, GFR 30-59 ml/min ([place]) 08/29/2011   per patient, kidneys monitored by Dr Posey Pronto endocrinologist ,lov was  05-18-2018, "kidney fx stable"   Colon polyp    Complication of anesthesia    Difficult intubation    small mouth opening   Diverticulosis    Gout    HTN (hypertension) 08/29/2011   Hyperlipemia    Psoriasis    Type II or unspecified type diabetes mellitus without mention of complication, not stated as uncontrolled 08/29/2011    Past Surgical History   Past Surgical History:  Procedure Laterality Date   BREAST LUMPECTOMY WITH NEEDLE LOCALIZATION Right 05/07/2012   Procedure: BREAST LUMPECTOMY WITH NEEDLE LOCALIZATION;  Surgeon: Merrie Roof, MD;  Location: [facility];  Service: General;  Laterality: Right;   CHOLECYSTECTOMY     COLONOSCOPY  2009   [facility]    COLONOSCOPY WITH PROPOFOL N/A 05/20/2018   Procedure: COLONOSCOPY WITH PROPOFOL;  Surgeon: Milus Banister, MD;  Location: [facility];  Service: Endoscopy;  Laterality: N/A;   HERNIA REPAIR     HIP SURGERY     LAMINECTOMY WITH POSTERIOR LATERAL ARTHRODESIS LEVEL 4 N/A 08/01/2020   Procedure: LAMINECTOMY WITH POSTERIOR LATERAL ARTHRODESIS Thoracic nine- Thoracic eleven ;  Surgeon: Consuella Lose, MD;  Location: [facility];  Service: Neurosurgery;  Laterality: N/A;   POLYPECTOMY  05/20/2018   Procedure:  POLYPECTOMY;  Surgeon: Milus Banister, MD;  Location: [facility];  Service: Endoscopy;;   RHINOPLASTY     SALPINGOOPHORECTOMY     TOTAL HIP ARTHROPLASTY Right 2004    Social History   Social History   Tobacco Use   Smoking status: Never   Smokeless tobacco: Never  Vaping Use   Vaping Use: Never used  Substance Use Topics   Alcohol use: Yes    Comment: Drinks on Special Occasions   Drug use: No    Medications   Prior to Admission medications   Medication Sig Start Date End Date Taking? Authorizing Provider  allopurinol (ZYLOPRIM) 300 MG tablet Take 150 mg by mouth in the morning. (0900)   Yes [provider]  atenolol (TENORMIN) 100 MG tablet Take 100 mg by mouth in the morning. (0900)   Yes [provider]  b complex vitamins tablet Take 1 tablet by mouth in the morning. (0900)   Yes [provider]  benzonatate (TESSALON) 100 MG capsule Take 100 mg by mouth every 8 (eight) hours as needed for cough.   Yes [provider]  cloNIDine (CATAPRES) 0.2 MG tablet Take 1 tablet (0.2 mg total) by mouth 2 (two) times daily. Patient taking differently: Take 0.2 mg by mouth 2 (two) times daily. (0900 & 2100) 08/08/20  Yes Lavina Hamman, MD  ferrous sulfate 325 (65 FE) MG tablet Take 325 mg by mouth daily with breakfast. (  0900)   Yes [provider]  furosemide (LASIX) 40 MG tablet Take 40 mg by mouth in the morning. (0900)   Yes [provider]  glipiZIDE (GLUCOTROL) 5 MG tablet Take 5 mg by mouth daily before breakfast. (1000)   Yes [provider]  HYDROcodone-acetaminophen (NORCO/VICODIN) 5-325 MG tablet Take 2 tablets by mouth every 6 (six) hours as needed for moderate pain or severe pain. 08/21/20 08/21/21 Yes Medina-Vargas, Monina C, NP  loratadine (CLARITIN) 10 MG tablet Take 10 mg by mouth in the morning. (0900)   Yes [provider]  magnesium hydroxide (MILK OF MAGNESIA) 400 MG/5ML suspension Take 30 mLs by  mouth daily as needed for mild constipation.   Yes [provider]  mometasone (ELOCON) 0.1 % cream Apply 1 application topically daily as needed (psoriasis). 08/08/20  Yes Lavina Hamman, MD  Omega-3 1000 MG CAPS Take 1,000 mg by mouth 2 (two) times daily. (0900 & 2100)   Yes [provider]  ondansetron (ZOFRAN) 4 MG tablet Take 4 mg by mouth every 8 (eight) hours as needed for nausea or vomiting.   Yes [provider]  polyethylene glycol (MIRALAX / GLYCOLAX) 17 g packet Take 17 g by mouth daily. Patient taking differently: Take 17 g by mouth in the morning. (0900) 08/08/20  Yes Lavina Hamman, MD  sitaGLIPtin (JANUVIA) 50 MG tablet Take 50 mg by mouth in the morning. (0900)   Yes [provider]  sulfamethoxazole-trimethoprim (BACTRIM DS) 800-160 MG tablet Take 2 tablets by mouth 2 (two) times daily. (0800 & 2100)   Yes [provider]  bisacodyl (DULCOLAX) 10 MG suppository Place 10 mg rectally as needed for moderate constipation.    [provider]    Allergies   Allergies  Allergen Reactions   Codeine Itching   Iodinated Diagnostic Agents     Other reaction(s): Unknown   Pentazocine Other (See Comments)    Hallucinations   Statins     Review of Systems  ROS  Neurologic Exam  Awake, alert, oriented Memory and concentration grossly intact Speech fluent, appropriate CN grossly intact Motor exam: Upper Extremities Deltoid Bicep Tricep Grip  Right 5/5 5/5 5/5 5/5  Left 5/5 5/5 5/5 5/5   Lower Extremities IP Quad PF DF EHL  Right 5/5 5/5 5/5 5/5 5/5  Left 5/5 5/5 5/5 5/5 5/5   Sensation grossly intact to LT  Impression  - 75 y.o. female status post percutaneous instrumented stabilization of T10 fracture with wound dehiscence and superficial wound infection  Plan  -We will plan on proceeding with wound debridement and application of a wound VAC  I have reviewed the indications for surgery with the patient and her  daughter.  Expected postoperative course as well as the associated risks, benefits, and alternatives to surgery were all reviewed.  After all her questions were answered informed consent was obtained and witnessed.   Consuella Lose, MD [facility]

## 2020-08-30 NOTE — Anesthesia Procedure Notes (Signed)
Procedure Name: Intubation Date/Time: 08/30/2020 6:20 PM Performed by: Moshe Salisbury, CRNA Pre-anesthesia Checklist: Patient identified, Emergency Drugs available, Suction available and Patient being monitored Patient Re-evaluated:Patient Re-evaluated prior to induction Oxygen Delivery Method: Circle System Utilized Preoxygenation: Pre-oxygenation with 100% oxygen Induction Type: IV induction Ventilation: Mask ventilation without difficulty Laryngoscope Size: Glidescope and 3 Grade View: Grade I Tube type: Oral Tube size: 7.0 mm Number of attempts: 1 Airway Equipment and Method: Stylet and Oral airway Placement Confirmation: ETT inserted through vocal cords under direct vision, positive ETCO2 and breath sounds checked- equal and bilateral Secured at: 21 cm Tube secured with: Tape Dental Injury: Teeth and Oropharynx as per pre-operative assessment

## 2020-08-30 NOTE — Anesthesia Postprocedure Evaluation (Signed)
Anesthesia Post Note  Patient: Jacqueline Richmond  Procedure(s) Performed: SUPERFICIALWOUND EXPLORATION, APPLY WOUND VAC APPLICATION OF WOUND VAC     Patient location during evaluation: PACU Anesthesia Type: General Level of consciousness: awake Pain management: pain level controlled Vital Signs Assessment: post-procedure vital signs reviewed and stable Respiratory status: spontaneous breathing, nonlabored ventilation, respiratory function stable and patient connected to nasal cannula oxygen Cardiovascular status: blood pressure returned to baseline and stable Postop Assessment: no apparent nausea or vomiting Anesthetic complications: no   No notable events documented.  Last Vitals:  Vitals:   08/30/20 2015 08/30/20 2040  BP: (!) 161/71 (!) 159/69  Pulse: 74 74  Resp: (!) 24 18  Temp: (!) 36.4 C 36.7 C  SpO2: 96% 100%    Last Pain:  Vitals:   08/30/20 2136  TempSrc:   PainSc: 6                  Abreanna Drawdy P Morissa Obeirne

## 2020-08-30 NOTE — Progress Notes (Signed)
Covid swab dropped off in Micro lab @ 1344.

## 2020-08-30 NOTE — Transfer of Care (Signed)
Immediate Anesthesia Transfer of Care Note  Patient: Jacqueline Richmond  Procedure(s) Performed: SUPERFICIALWOUND EXPLORATION, APPLY WOUND VAC APPLICATION OF WOUND VAC  Patient Location: PACU  Anesthesia Type:General  Level of Consciousness: awake, alert , oriented and patient cooperative  Airway & Oxygen Therapy: Patient Spontanous Breathing and Patient connected to nasal cannula oxygen  Post-op Assessment: Report given to RN and Post -op Vital signs reviewed and stable  Post vital signs: Reviewed and stable  Last Vitals:  Vitals Value Taken Time  BP 133/57 08/30/20 1914  Temp    Pulse 79 08/30/20 1914  Resp 15 08/30/20 1914  SpO2 96 % 08/30/20 1914  Vitals shown include unvalidated device data.  Last Pain:  Vitals:   08/30/20 1350  TempSrc:   PainSc: 8       Patients Stated Pain Goal: 5 (94/07/68 0881)  Complications: No notable events documented.

## 2020-08-31 ENCOUNTER — Encounter (HOSPITAL_COMMUNITY): Payer: Self-pay | Admitting: Neurosurgery

## 2020-08-31 DIAGNOSIS — T8132XA Disruption of internal operation (surgical) wound, not elsewhere classified, initial encounter: Secondary | ICD-10-CM | POA: Diagnosis not present

## 2020-08-31 LAB — GLUCOSE, CAPILLARY
Glucose-Capillary: 118 mg/dL — ABNORMAL HIGH (ref 70–99)
Glucose-Capillary: 89 mg/dL (ref 70–99)
Glucose-Capillary: 53 mg/dL — ABNORMAL LOW (ref 70–99)
Glucose-Capillary: 75 mg/dL (ref 70–99)
Glucose-Capillary: 42 mg/dL — CL (ref 70–99)
Glucose-Capillary: 48 mg/dL — ABNORMAL LOW (ref 70–99)
Glucose-Capillary: 91 mg/dL (ref 70–99)

## 2020-08-31 LAB — BASIC METABOLIC PANEL
Anion gap: 9 (ref 5–15)
BUN: 47 mg/dL — ABNORMAL HIGH (ref 8–23)
CO2: 22 mmol/L (ref 22–32)
Calcium: 8.9 mg/dL (ref 8.9–10.3)
Chloride: 102 mmol/L (ref 98–111)
Creatinine, Ser: 3.13 mg/dL — ABNORMAL HIGH (ref 0.44–1.00)
GFR, Estimated: 15 mL/min — ABNORMAL LOW (ref >60)
Glucose, Bld: 132 mg/dL — ABNORMAL HIGH (ref 70–99)
Potassium: 6.2 mmol/L — ABNORMAL HIGH (ref 3.5–5.1)
Sodium: 133 mmol/L — ABNORMAL LOW (ref 135–145)

## 2020-08-31 MED ORDER — ALBUTEROL SULFATE (2.5 MG/3ML) 0.083% IN NEBU
2.5000 mg | INHALATION_SOLUTION | Freq: Once | RESPIRATORY_TRACT | Status: AC
  Administered 2020-08-31: 2.5 mg via RESPIRATORY_TRACT

## 2020-08-31 MED ORDER — GLUCAGON HCL RDNA (DIAGNOSTIC) 1 MG IJ SOLR
1.0000 mg | Freq: Once | INTRAMUSCULAR | Status: AC | PRN
  Administered 2020-08-31: 1 mg via INTRAVENOUS

## 2020-08-31 MED ORDER — ONDANSETRON HCL 4 MG PO TABS
4.0000 mg | ORAL_TABLET | ORAL | Status: DC | PRN
Start: 2020-08-31 — End: 2020-09-05

## 2020-08-31 MED ORDER — ALBUTEROL SULFATE (2.5 MG/3ML) 0.083% IN NEBU
INHALATION_SOLUTION | RESPIRATORY_TRACT | Status: AC
  Filled 2020-08-31: qty 3

## 2020-08-31 MED ORDER — ONDANSETRON HCL 4 MG/2ML IJ SOLN
4.0000 mg | INTRAMUSCULAR | Status: DC | PRN
  Administered 2020-08-31: 4 mg via INTRAVENOUS

## 2020-08-31 MED ORDER — GLUCAGON HCL RDNA (DIAGNOSTIC) 1 MG IJ SOLR
INTRAMUSCULAR | Status: AC
  Filled 2020-08-31: qty 1

## 2020-08-31 MED ORDER — DEXTROSE 50 % IV SOLN
INTRAVENOUS | Status: AC
  Filled 2020-08-31: qty 50

## 2020-08-31 NOTE — Progress Notes (Signed)
Date and time results received: 08/31/20 2108  Test: CBG Critical Value:42  Name of Provider Notified:None

## 2020-08-31 NOTE — Progress Notes (Signed)
Patient was hypoglycemic with a blood sugar level of 42 at 2108. She was asymptomatic and no complaints of dizziness nor jittery feeling. She was given 1 mg Glucagon via IM on her left deltoid at 2117 due to lack of IV access. At 2140, her blood sugar went up to 91. Requested IV team for an IV restart and will restart her fluids. Will continue to monitor patient.

## 2020-08-31 NOTE — Discharge Summary (Addendum)
Physician Discharge Summary  Patient ID: ARIEN Richmond MRN: [id number] DOB/AGE: June 04, 1945 75 y.o.  Admit date: 08/30/2020 Discharge date: 09/04/2020  Admission Diagnoses:  Wound dehiscence  Discharge Diagnoses:  Same Active Problems:   Wound dehiscence   Discharged Condition: Stable  Hospital Course:  Jacqueline Richmond is a 75 y.o. female admitted for wound debridement and application of wound vac after previous percutaneous stabilization of T10 fracture. She was kept in-house for PT/OT eval. She was discharged postoperatively back to her SNF. I have spoken to CSW and there are personnel available at [facility] to manage her wound VAC.  Treatments: Surgery - wound debridement/application of VAC  Discharge Exam: Blood pressure 140/63, pulse 91, temperature 99.1 F (37.3 C), resp. rate 18, height 5\' 3"  (1.6 m), weight 73.4 kg, SpO2 97 %. Awake, alert, oriented Speech fluent, appropriate CN grossly intact Moving BLE  Wound vac in place, good seal  Disposition: Discharge disposition: 03-Skilled Nursing Facility      Discharge Instructions     Call MD for:  redness, tenderness, or signs of infection (pain, swelling, redness, odor or green/yellow discharge around incision site)   Complete by: As directed    Call MD for:  redness, tenderness, or signs of infection (pain, swelling, redness, odor or green/yellow discharge around incision site)   Complete by: As directed    Call MD for:  redness, tenderness, or signs of infection (pain, swelling, redness, odor or green/yellow discharge around incision site)   Complete by: As directed    Call MD for:  temperature >100.4   Complete by: As directed    Call MD for:  temperature >100.4   Complete by: As directed    Call MD for:  temperature >100.4   Complete by: As directed    Diet - low sodium heart healthy   Complete by: As directed    Discharge instructions   Complete by: As directed    Walk at home as much as possible, at least  4 times / day   Discharge instructions   Complete by: As directed    Walk at home as much as possible, at least 4 times / day   Discharge instructions   Complete by: As directed    Walk at home as much as possible, at least 4 times / day   Discharge wound care:   Complete by: As directed    Per wound/ostomy RN for mgmt of wound VAC   Discharge wound care:   Complete by: As directed    Per [facility] RN   Discharge wound care:   Complete by: As directed    VAC mgmt per wound care RN   Increase activity slowly   Complete by: As directed    Increase activity slowly   Complete by: As directed    Increase activity slowly   Complete by: As directed    Lifting restrictions   Complete by: As directed    No lifting > 10 lbs   Lifting restrictions   Complete by: As directed    No lifting > 10 lbs   Lifting restrictions   Complete by: As directed    No lifting > 10 lbs   May shower / Bathe   Complete by: As directed    48 hours after surgery   May shower / Bathe   Complete by: As directed    48 hours after surgery   May shower / Bathe   Complete by: As directed  48 hours after surgery   May walk up steps   Complete by: As directed    May walk up steps   Complete by: As directed    May walk up steps   Complete by: As directed    Other Restrictions   Complete by: As directed    No bending/twisting at waist   Other Restrictions   Complete by: As directed    No bending/twisting at waist   Other Restrictions   Complete by: As directed    No bending/twisting at waist      Allergies as of 09/04/2020       Reactions   Codeine Itching   Iodinated Diagnostic Agents    Other reaction(s): Unknown   Pentazocine Other (See Comments)   Hallucinations   Statins         Medication List     STOP taking these medications    sulfamethoxazole-trimethoprim 800-160 MG tablet Commonly known as: BACTRIM DS       TAKE these medications    allopurinol 300 MG tablet Commonly  known as: ZYLOPRIM Take 150 mg by mouth in the morning. (0900)   atenolol 100 MG tablet Commonly known as: TENORMIN Take 100 mg by mouth in the morning. (0900)   b complex vitamins tablet Take 1 tablet by mouth in the morning. (0900)   benzonatate 100 MG capsule Commonly known as: TESSALON Take 100 mg by mouth every 8 (eight) hours as needed for cough.   bisacodyl 10 MG suppository Commonly known as: DULCOLAX Place 10 mg rectally as needed for moderate constipation.   feeding supplement Liqd Take 237 mLs by mouth 3 (three) times daily between meals.   ferrous sulfate 325 (65 FE) MG tablet Take 325 mg by mouth daily with breakfast. (0900)   furosemide 40 MG tablet Commonly known as: LASIX Take 40 mg by mouth in the morning. (0900)   glipiZIDE 5 MG tablet Commonly known as: GLUCOTROL Take 5 mg by mouth daily before breakfast. (1000)   HYDROcodone-acetaminophen 5-325 MG tablet Commonly known as: NORCO/VICODIN Take 2 tablets by mouth every 6 (six) hours as needed for moderate pain or severe pain.   loratadine 10 MG tablet Commonly known as: CLARITIN Take 10 mg by mouth in the morning. (0900)   magnesium hydroxide 400 MG/5ML suspension Commonly known as: MILK OF MAGNESIA Take 30 mLs by mouth daily as needed for mild constipation.   mometasone 0.1 % cream Commonly known as: ELOCON Apply 1 application topically daily as needed (psoriasis).   multivitamin with minerals Tabs tablet Take 1 tablet by mouth daily.   Omega-3 1000 MG Caps Take 1,000 mg by mouth 2 (two) times daily. (0900 & 2100)   ondansetron 4 MG tablet Commonly known as: ZOFRAN Take 4 mg by mouth every 8 (eight) hours as needed for nausea or vomiting.   sitaGLIPtin 50 MG tablet Commonly known as: JANUVIA Take 50 mg by mouth in the morning. (0900)       ASK your doctor about these medications    cloNIDine 0.2 MG tablet Commonly known as: CATAPRES Take 1 tablet (0.2 mg total) by mouth 2 (two)  times daily.   polyethylene glycol 17 g packet Commonly known as: MIRALAX / GLYCOLAX Take 17 g by mouth daily.               Discharge Care Instructions  (From admission, onward)           Start     Ordered  09/04/20 0000  Discharge wound care:       Comments: VAC mgmt per wound care RN   09/04/20 1143   09/03/20 0000  Discharge wound care:       Comments: Per [facility] RN   09/03/20 1401   08/31/20 0000  Discharge wound care:       Comments: Per wound/ostomy RN for mgmt of wound VAC   08/31/20 1032            Follow-up Information     Consuella Lose, MD Follow up in 2 week(s).   Specialty: Neurosurgery Why: For wound re-check Contact information: [address] [place] [address] [phone]                 Signed: Jairo Ben 09/04/2020, 11:44 AM

## 2020-08-31 NOTE — Evaluation (Signed)
Occupational Therapy Evaluation Patient Details Name: Jacqueline Richmond MRN: [id number] DOB: [birth year] Today's Date: 08/31/2020    History of Present Illness Patient is a 75 y/o female who was admitted on 08/30/20 for wound debridement and application of wound vac after previous percutaneous stabilization of T10 fracture. PMH includes DM2, essential HTN, CKD, Rt THA 2004, T8-11 stabilization for T10 fx 08/01/20.   Clinical Impression   Patient admitted for above and limited by problem list below, including pain, precautions, balance, decreased activity tolerance and generalized weakness. Pt was at [facility], reports using RW for mobility and needing some assist for ADLs.  She currently requires mod assist for bed mobility, min assist for transfers and in room mobility and up to max assist for ADLs.  She is on 3L O2 during session with VSS.  Requires cueing to adhere to back precautions functionally, although able to report precautions without assist. Continue education on back precautions, ADL compensatory techniques and safety.  Believe she will benefit from continued OT services while admitted and after dc at SNF to optimize independence, safety with ADLs and mobility.     Follow Up Recommendations  SNF;Supervision/Assistance - 24 hour    Equipment Recommendations  None recommended by OT    Recommendations for Other Services       Precautions / Restrictions Precautions Precautions: Fall;Back;Other (comment) Precaution Booklet Issued: Yes (comment) Precaution Comments: reviewed precautions, wound vac, O2 Required Braces or Orthoses: Spinal Brace Spinal Brace: Thoracolumbosacral orthotic (per orders, no brace needed) Restrictions Weight Bearing Restrictions: No      Mobility Bed Mobility Overal bed mobility: Needs Assistance Bed Mobility: Rolling;Sidelying to Sit;Sit to Sidelying Rolling: Min assist Sidelying to sit: Mod assist;HOB elevated     Sit to sidelying: Mod assist;HOB  elevated General bed mobility comments: requires cueing for log roll technique, pt attempting to sit up with heavy use of rails and requires mod assist for trunk to ascend after log roll; returned to supine with assist for BLEs back to supine    Transfers Overall transfer level: Needs assistance Equipment used: Rolling walker (2 wheeled) Transfers: Sit to/from Stand Sit to Stand: Min assist         General transfer comment: Min A to power to standing with cues for hand placement as pt attempting to pull up on RW. Flexed trunk/hips in standing.    Balance Overall balance assessment: Needs assistance Sitting-balance support: Feet supported;No upper extremity supported Sitting balance-Leahy Scale: Good Sitting balance - Comments: supervision statically   Standing balance support: Bilateral upper extremity supported;During functional activity Standing balance-Leahy Scale: Poor Standing balance comment: Requires UE support in standing.                           ADL either performed or assessed with clinical judgement   ADL Overall ADL's : Needs assistance/impaired     Grooming: Min guard;Standing;Wash/dry hands   Upper Body Bathing: Set up;Sitting   Lower Body Bathing: Moderate assistance;Sit to/from stand   Upper Body Dressing : Minimal assistance;Sitting   Lower Body Dressing: Maximal assistance;Sit to/from stand Lower Body Dressing Details (indicate cue type and reason): requires assist for socks, min assist sit to stand Toilet Transfer: Minimal assistance;Ambulation;RW   Toileting- Clothing Manipulation and Hygiene: Minimal assistance;Sit to/from stand Toileting - Clothing Manipulation Details (indicate cue type and reason): clothing mgmt     Functional mobility during ADLs: Minimal assistance;Rolling walker;Cueing for safety;Cueing for sequencing General ADL Comments: pt limited by  back precautions, pain, decreased activity tolerance, balance and cognition      Vision   Vision Assessment?: No apparent visual deficits     Perception     Praxis      Pertinent Vitals/Pain Pain Assessment: 0-10 Pain Score: 8  Pain Location: back Pain Descriptors / Indicators: Operative site guarding;Sore Pain Intervention(s): Limited activity within patient's tolerance;Monitored during session;Repositioned     Hand Dominance Right   Extremity/Trunk Assessment Upper Extremity Assessment Upper Extremity Assessment: Generalized weakness   Lower Extremity Assessment Lower Extremity Assessment: Defer to PT evaluation   Cervical / Trunk Assessment Cervical / Trunk Assessment: Other exceptions Cervical / Trunk Exceptions: s/p back surgery   Communication Communication Communication: No difficulties   Cognition Arousal/Alertness: Awake/alert Behavior During Therapy: WFL for tasks assessed/performed;Flat affect Overall Cognitive Status: No family/caregiver present to determine baseline cognitive functioning Area of Impairment: Memory;Following commands;Awareness;Problem solving;Safety/judgement                     Memory: Decreased short-term memory Following Commands: Follows one step commands consistently;Follows one step commands with increased time;Follows multi-step commands inconsistently Safety/Judgement: Decreased awareness of safety;Decreased awareness of deficits Awareness: Emergent Problem Solving: Slow processing;Difficulty sequencing;Requires verbal cues General Comments: patient requires repetition at times to follow commands, with difficutly adhering to precautions functionally. Able to state 3/3 precautions.  Decreased safety awareness.   General Comments  Spouse present during session.    Exercises     Shoulder Instructions      Home Living Family/patient expects to be discharged to:: Skilled nursing facility                                        Prior Functioning/Environment Level of  Independence: Needs assistance    ADL's / Homemaking Assistance Needed: reports needing some assist for LBADLs at [facility], but ambulating to/from restroom without assist using RW   Comments: Reports using RW for ambulation at [place].        OT Problem List: Decreased strength;Decreased activity tolerance;Impaired balance (sitting and/or standing);Pain;Decreased knowledge of precautions;Decreased knowledge of use of DME or AE;Decreased safety awareness;Decreased cognition      OT Treatment/Interventions: Self-care/ADL training;Therapeutic exercise;DME and/or AE instruction;Therapeutic activities;Cognitive remediation/compensation;Balance training;Patient/family education    OT Goals(Current goals can be found in the care plan section) Acute Rehab OT Goals Patient Stated Goal: to get better OT Goal Formulation: With patient Time For Goal Achievement: 09/14/20 Potential to Achieve Goals: Good  OT Frequency: Min 2X/week   Barriers to D/C:            Co-evaluation              AM-PAC OT "6 Clicks" Daily Activity     Outcome Measure Help from another person eating meals?: None Help from another person taking care of personal grooming?: A Little Help from another person toileting, which includes using toliet, bedpan, or urinal?: A Little Help from another person bathing (including washing, rinsing, drying)?: A Little Help from another person to put on and taking off regular upper body clothing?: A Little Help from another person to put on and taking off regular lower body clothing?: A Lot 6 Click Score: 18   End of Session Equipment Utilized During Treatment: Rolling walker;Oxygen Nurse Communication: Mobility status  Activity Tolerance: Patient tolerated treatment well Patient left: in bed;with call bell/phone within reach;with family/visitor present;with SCD's reapplied  OT  Visit Diagnosis: Other abnormalities of gait and mobility (R26.89);Muscle weakness  (generalized) (M62.81);Pain;History of falling (Z91.81) Pain - part of body:  (back)                Time: 6893-4068 OT Time Calculation (min): 26 min Charges:  OT General Charges $OT Visit: 1 Visit OT Evaluation $OT Eval Moderate Complexity: 1 Mod OT Treatments $Self Care/Home Management : 8-22 mins  Jolaine Artist, OT Acute Rehabilitation Services Pager [phone] Office [phone]   Delight Stare 08/31/2020, 1:25 PM

## 2020-08-31 NOTE — Consult Note (Signed)
[facility] Nurse Consult Note: Patient receiving care in [place] Reason for Consult: Wound vac management Spoke with Bedside RN and she states that this patient has a hospital vac on since yesterday and will be transferred to [facility] today. She is already in process of trying to get a home vac for her to be transferred. [place] services are not needed at this time. Will sign off.   Thank you for the consult. [place] nurse will not follow at this time.   Please re-consult the [facility] team if needed.  Cathlean Marseilles Tamala Julian, MSN, RN, [place], Lysle Pearl, [facility] Wound Treatment Associate Pager [phone]

## 2020-08-31 NOTE — Evaluation (Signed)
Physical Therapy Evaluation Patient Details Name: Jacqueline Richmond MRN: [id number] DOB: 16-Aug-1945 Today's Date: 08/31/2020   History of Present Illness  Patient is a 75 y/o female who was admitted on 08/30/20 for wound debridement and application of wound vac after previous percutaneous stabilization of T10 fracture. PMH includes DM2, essential HTN, CKD, Rt THA 2004, T8-11 stabilization for T10 fx 08/01/20.  Clinical Impression  Patient presents with nausea, pain and post surgical deficits s/p above surgery. Pt was at [facility] and reports using RW for ambulation and assisting with ADLs. Today, pt requires Mod A for bed mobility and Min A for transfers with cues for hand placement. Tolerated gait training with Min guard assist. Requires cues to adhere to back precautions during mobility. Able to recall precautions from OT session but has a hard time implementing them. Would benefit from return to SNF to maximize independence and mobility prior to return home. Will follow acutely.    Follow Up Recommendations SNF (return to [facility])    Equipment Recommendations  None recommended by PT    Recommendations for Other Services       Precautions / Restrictions Precautions Precautions: Fall;Back;Other (comment) Precaution Booklet Issued: Yes (comment) Precaution Comments: Reviewed precautions./handout provided by OT, wound vac, 02 Required Braces or Orthoses: Spinal Brace Spinal Brace: Thoracolumbosacral orthotic (per orders, no brace needed) Restrictions Weight Bearing Restrictions: No      Mobility  Bed Mobility Overal bed mobility: Needs Assistance Bed Mobility: Rolling;Sidelying to Sit Rolling: Min assist Sidelying to sit: Mod assist;HOB elevated       General bed mobility comments: Cues for log roll technique as pt trying to sit up with heavy use of rails; needs assist with trunk to get to EOB. Assist to bring BLEs into bed to return to supine.    Transfers Overall transfer  level: Needs assistance Equipment used: Rolling walker (2 wheeled) Transfers: Sit to/from Stand Sit to Stand: Min assist         General transfer comment: Min A to power to standing with cues for hand placement as pt attempting to pull up on RW. Flexed trunk/hips in standing.  Ambulation/Gait Ambulation/Gait assistance: Min guard Gait Distance (Feet): 70 Feet Assistive device: Rolling walker (2 wheeled) Gait Pattern/deviations: Step-through pattern;Decreased stride length;Trunk flexed Gait velocity: decreased   General Gait Details: Slow, mildly unsteady gait with RW for support; assist with turns. Cues for upright posture.  Stairs            Wheelchair Mobility    Modified Rankin (Stroke Patients Only)       Balance Overall balance assessment: Needs assistance Sitting-balance support: Feet supported;No upper extremity supported Sitting balance-Leahy Scale: Good Sitting balance - Comments: Assist needed to donn TLSO   Standing balance support: During functional activity Standing balance-Leahy Scale: Poor Standing balance comment: Requires UE support in standing.                             Pertinent Vitals/Pain Pain Assessment: 0-10 Pain Score: 8  Pain Location: back Pain Descriptors / Indicators: Operative site guarding;Sore Pain Intervention(s): Monitored during session;Repositioned    Home Living Family/patient expects to be discharged to:: Skilled nursing facility                      Prior Function Level of Independence: Needs assistance         Comments: Reports using RW for ambulation at [facility].  Hand Dominance   Dominant Hand: Right    Extremity/Trunk Assessment   Upper Extremity Assessment Upper Extremity Assessment: Defer to OT evaluation    Lower Extremity Assessment Lower Extremity Assessment: Generalized weakness (but functional)    Cervical / Trunk Assessment Cervical / Trunk Assessment: Other  exceptions Cervical / Trunk Exceptions: s/p back surgery  Communication   Communication: No difficulties  Cognition Arousal/Alertness: Awake/alert Behavior During Therapy: WFL for tasks assessed/performed;Flat affect Overall Cognitive Status: No family/caregiver present to determine baseline cognitive functioning                                 General Comments: Requires repetition at times to follow commands, possibly due to [facility]. Able to state precautions but does not follow them during functional tasks/mobility.      General Comments General comments (skin integrity, edema, etc.): Spouse present during session.    Exercises     Assessment/Plan    PT Assessment Patient needs continued PT services  PT Problem List Decreased strength;Decreased mobility;Decreased knowledge of precautions;Pain;Decreased knowledge of use of DME;Decreased skin integrity       PT Treatment Interventions Therapeutic exercise;Patient/family education;Therapeutic activities;Functional mobility training;Gait training;Balance training;DME instruction    PT Goals (Current goals can be found in the Care Plan section)  Acute Rehab PT Goals Patient Stated Goal: to get better PT Goal Formulation: With patient/family Time For Goal Achievement: 09/14/20 Potential to Achieve Goals: Good    Frequency Min 3X/week   Barriers to discharge        Co-evaluation               AM-PAC PT "6 Clicks" Mobility  Outcome Measure Help needed turning from your back to your side while in a flat bed without using bedrails?: A Little Help needed moving from lying on your back to sitting on the side of a flat bed without using bedrails?: A Lot Help needed moving to and from a bed to a chair (including a wheelchair)?: A Little Help needed standing up from a chair using your arms (e.g., wheelchair or bedside chair)?: A Little Help needed to walk in hospital room?: A Little Help needed climbing 3-5 steps  with a railing? : A Lot 6 Click Score: 16    End of Session Equipment Utilized During Treatment: Back brace;Gait belt Activity Tolerance: Patient limited by pain;Patient tolerated treatment well Patient left: in bed;with call bell/phone within reach;with bed alarm set;with family/visitor present Nurse Communication: Mobility status PT Visit Diagnosis: Pain;Difficulty in walking, not elsewhere classified (R26.2) Pain - part of body:  (back)    Time: 2440-1027 PT Time Calculation (min) (ACUTE ONLY): 22 min   Charges:   PT Evaluation $PT Eval Moderate Complexity: 1 Mod          Marisa Severin, PT, DPT Acute Rehabilitation Services Pager [phone] Office [place] 08/31/2020, 1:12 PM

## 2020-08-31 NOTE — Plan of Care (Signed)
Adequately Ready for Discharge 

## 2020-08-31 NOTE — TOC Initial Note (Addendum)
Transition of Care [facility]) - Initial/Assessment Note    Patient Details  Name: Jacqueline Richmond MRN: [id number] Date of Birth: November 21, 1945  [facility]) CM/SW Contact:    [name], LCSW Phone Number: 08/31/2020, 1:03 PM  Clinical Narrative:    CSW contacted regarding wound vac for pt.  Order from brought to floor, signed by MD, submitted to [facility].  Per [name], order in process.  CSW met with pt, husband [name], daughter [name] in room, permission given to speak with husband and daughter present.  Pt lives with husband but has been at [facility] for SNF rehab past 2 weeks.  Pt [employer] Josem Kaufmann ran out and plan was for pt to transition to [facility], however, [name] was unable to manage pt wound.  Family has continued to self pay for pt to remain at [facility].  Discussed wound vac.  Discussed potential reauth for more rehab at [facility], family/pt agreeable and will self pay if rehab not recommended by PT.  CSW spoke with RN and MD, agreed to have PT do eval for new SNF auth.  CSW spoke with [name] at [facility], who confirmed pt has bed.  [name] called back lator stating they need new SNF auth.  CSW informed [name] we don't have PT recommendation yet, may or may not be recommended for more SNF, family willing to self pay if can't get insurance auth.    1200: [name] again spoke with her management and reports that they cannot accept pt back as self pay anymore, pt cannot return until has new insurance auth for rehab.  RN informed.  Still awaiting PT recommendation. CSW updated pt daughter [name].     1410: SNF auth request submitted to [employer]       1510: CSW spoke with [name] at [facility].  Wound vac will be delivered within 2 hours.     1550: SNF auth still pending in [employer].  1550: Per [name] at [facility], they cannot do a weekend admission.     Expected Discharge Plan: Skilled Nursing Facility Barriers to Discharge: Other (must enter comment) (SNF pending insurance  auth)   Patient Goals and CMS Choice Patient states their goals for this hospitalization and ongoing recovery are:: "walk, feel all right" CMS Medicare.gov Compare Post Acute Care list provided to::  (NA-return to [facility])    Expected Discharge Plan and Services Expected Discharge Plan: [place] In-house Referral: Clinical Social Work   Post Acute Care Choice: [place] Living arrangements for the past 2 months: [place] (Pt has been at [facility] for past 2 weeks) Expected Discharge Date: 08/31/20               DME Arranged: Other see comment (wound vac) DME Agency: [employer] Date DME Agency Contacted: 08/31/20 Time DME Agency Contacted: 0900 Representative spoke with at DME Agency: [place] Arranged: NA          Prior Living Arrangements/Services Living arrangements for the past 2 months: [place] (Pt has been at [facility] for past 2 weeks) Lives with:: Spouse Patient language and need for interpreter reviewed:: Yes Do you feel safe going back to the place where you live?: Yes      Need for Family Participation in Patient Care: Yes (Comment) Care giver support system in place?: Yes (comment) Current home services: Other (comment) (none) Criminal Activity/Legal Involvement Pertinent to Current Situation/Hospitalization: No - Comment as needed  Activities of Daily Living Home Assistive Devices/Equipment: Wheelchair, CBG Meter  ADL Screening (condition at time of admission) Patient's cognitive ability adequate to safely complete daily activities?: Yes Is the patient deaf or have difficulty hearing?: No Does the patient have difficulty seeing, even when wearing glasses/contacts?: No Does the patient have difficulty concentrating, remembering, or making decisions?: No Patient able to express need for assistance with ADLs?: Yes Does the patient have difficulty dressing or bathing?: No Independently performs ADLs?: Yes (appropriate  for developmental age) Does the patient have difficulty walking or climbing stairs?: No Weakness of Legs: None Weakness of Arms/Hands: None  Permission Sought/Granted Permission sought to share information with : Family Supports, Chartered certified accountant granted to share information with : Yes, Verbal Permission Granted  Share Information with NAME: husband [name], daughter [name]  Permission granted to share info w AGENCY: [facility]        Emotional Assessment Appearance:: Appears stated age Attitude/Demeanor/Rapport: Engaged Affect (typically observed): Appropriate, Pleasant Orientation: : Oriented to Self, Oriented to Place, Oriented to  Time, Oriented to Situation Alcohol / Substance Use: Not Applicable Psych Involvement: No (comment)  Admission diagnosis:  Wound dehiscence [T81.30XA] Patient Active Problem List   Diagnosis Date Noted   Wound dehiscence 08/30/2020   Multiple thyroid nodules 08/10/2020   Acute blood loss anemia 08/09/2020   Hemothorax on right 08/02/2020   T10 vertebral fracture ([place]) 07/31/2020   Rotator cuff arthropathy of both shoulders 04/15/2019   History of colonic polyps    Polyp of transverse colon    Diverticulosis of colon without hemorrhage    Difficult intubation    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   Uncontrolled secondary diabetes mellitus with stage 3 CKD (GFR 30-59) ([place]) 08/29/2011   CKD (chronic kidney disease) stage 3, GFR 30-59 ml/min ([place]) 08/29/2011   Acute renal failure superimposed on stage 3a chronic kidney disease ([place]) 08/29/2011   Hyperkalemia 08/29/2011   HTN (hypertension) 08/29/2011   Gout 08/29/2011   PCP:  Bernerd Limbo, MD Pharmacy:   [id number] [place] [address] [place]Phone: [phone]: [phone] [place]Phone: [phone]: [phone]    Social Determinants of Health (SDOH) Interventions    Readmission Risk Interventions No flowsheet data found.

## 2020-08-31 NOTE — NC FL2 (Signed)
[place] LEVEL OF CARE SCREENING TOOL     IDENTIFICATION  Patient Name: Jacqueline Richmond Birthdate: 1945/11/12 Sex: female Admission Date (Current Location): 08/30/2020  [facility] and [place] Number:  Herbalist and Address:  [facility] [place] [place] [address] [place], [place] [address]      Provider Number: [id number]  Attending Physician Name and Address:  Consuella Lose, MD  Relative Name and Phone Number:  Campi,Ronald Spouse [phone]  [phone]    Current Level of Care: Hospital Recommended Level of Care: [place] Prior Approval Number:    Date Approved/Denied:   PASRR Number: [id number]  Discharge Plan: SNF    Current Diagnoses: Patient Active Problem List   Diagnosis Date Noted   Wound dehiscence 08/30/2020   Multiple thyroid nodules 08/10/2020   Acute blood loss anemia 08/09/2020   Hemothorax on right 08/02/2020   T10 vertebral fracture ([place]) 07/31/2020   Rotator cuff arthropathy of both shoulders 04/15/2019   History of colonic polyps    Polyp of transverse colon    Diverticulosis of colon without hemorrhage    Difficult intubation    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   Uncontrolled secondary diabetes mellitus with stage 3 CKD (GFR 30-59) (HCC) 08/29/2011   CKD (chronic kidney disease) stage 3, GFR 30-59 ml/min (HCC) 08/29/2011   Acute renal failure superimposed on stage 3a chronic kidney disease (HCC) 08/29/2011   Hyperkalemia 08/29/2011   HTN (hypertension) 08/29/2011   Gout 08/29/2011    Orientation RESPIRATION BLADDER Height & Weight     Self, Time, Situation, Place  O2 Incontinent Weight: 161 lb 12.8 oz (73.4 kg) Height:  5\' 3"  (160 cm)  BEHAVIORAL SYMPTOMS/MOOD NEUROLOGICAL BOWEL NUTRITION STATUS      Continent Diet (see discharge summary)  AMBULATORY STATUS COMMUNICATION OF NEEDS Skin   Independent Verbally Surgical wounds                       Personal Care Assistance Level  of Assistance  Bathing, Feeding, Dressing Bathing Assistance: Limited assistance Feeding assistance: Independent Dressing Assistance: Maximum assistance     Functional Limitations Info  Sight, Hearing, Speech Sight Info: Adequate Hearing Info: Adequate Speech Info: Adequate    SPECIAL CARE FACTORS FREQUENCY  PT (By licensed PT), OT (By licensed OT)     PT Frequency: 5x week OT Frequency: 5x week            Contractures Contractures Info: Not present    Additional Factors Info  Code Status, Allergies Code Status Info: full Allergies Info: Codeine, Iodinated Diagnostic Agents, Pentazocine, Statins           Current Medications (08/31/2020):  This is the current hospital active medication list Current Facility-Administered Medications  Medication Dose Route Frequency Provider Last Rate Last Admin   0.9 %  sodium chloride infusion   Intravenous Continuous Nundkumar, Nena Polio, MD       0.9 %  sodium chloride infusion  250 mL Intravenous Continuous Consuella Lose, MD       acetaminophen (TYLENOL) tablet 650 mg  650 mg Oral Q4H PRN Consuella Lose, MD       Or   acetaminophen (TYLENOL) suppository 650 mg  650 mg Rectal Q4H PRN Consuella Lose, MD       allopurinol (ZYLOPRIM) tablet 150 mg  150 mg Oral q AM Consuella Lose, MD   150 mg at 08/31/20 0628   atenolol (TENORMIN) tablet 100 mg  100  mg Oral q AM Consuella Lose, MD   100 mg at 08/31/20 2353   B-complex with vitamin C tablet 1 tablet  1 tablet Oral Daily Consuella Lose, MD   1 tablet at 08/31/20 0950   benzonatate (TESSALON) capsule 100 mg  100 mg Oral Q8H PRN Consuella Lose, MD   100 mg at 08/31/20 0131   bisacodyl (DULCOLAX) suppository 10 mg  10 mg Rectal PRN Consuella Lose, MD       cloNIDine (CATAPRES) tablet 0.2 mg  0.2 mg Oral BID Consuella Lose, MD   0.2 mg at 08/31/20 0950   docusate sodium (COLACE) capsule 100 mg  100 mg Oral BID Consuella Lose, MD   100 mg at 08/31/20 6144    ferrous sulfate tablet 325 mg  325 mg Oral Q breakfast Consuella Lose, MD   325 mg at 08/31/20 3154   furosemide (LASIX) tablet 40 mg  40 mg Oral q AM Consuella Lose, MD   40 mg at 08/31/20 0086   glipiZIDE (GLUCOTROL) tablet 5 mg  5 mg Oral QAC breakfast Consuella Lose, MD   5 mg at 08/31/20 7619   heparin injection 5,000 Units  5,000 Units Subcutaneous Q8H Consuella Lose, MD       HYDROcodone-acetaminophen (NORCO/VICODIN) 5-325 MG per tablet 2 tablet  2 tablet Oral Q6H PRN Consuella Lose, MD   2 tablet at 08/31/20 0950   insulin aspart (novoLOG) injection 0-15 Units  0-15 Units Subcutaneous TID WC Consuella Lose, MD       insulin aspart (novoLOG) injection 0-5 Units  0-5 Units Subcutaneous QHS Consuella Lose, MD       loratadine (CLARITIN) tablet 10 mg  10 mg Oral q AM Consuella Lose, MD   10 mg at 08/31/20 5093   magnesium hydroxide (MILK OF MAGNESIA) suspension 30 mL  30 mL Oral Daily PRN Consuella Lose, MD       menthol-cetylpyridinium (CEPACOL) lozenge 3 mg  1 lozenge Oral PRN Consuella Lose, MD       Or   phenol (CHLORASEPTIC) mouth spray 1 spray  1 spray Mouth/Throat PRN Consuella Lose, MD       methocarbamol (ROBAXIN) tablet 500 mg  500 mg Oral Q6H PRN Consuella Lose, MD       Or   methocarbamol (ROBAXIN) 500 mg in dextrose 5 % 50 mL IVPB  500 mg Intravenous Q6H PRN Consuella Lose, MD       omega-3 acid ethyl esters (LOVAZA) capsule 1 g  1 g Oral BID Consuella Lose, MD   1 g at 08/31/20 0951   ondansetron (ZOFRAN) tablet 4 mg  4 mg Oral Q4H PRN Consuella Lose, MD       Or   ondansetron (ZOFRAN) injection 4 mg  4 mg Intravenous Q4H PRN Consuella Lose, MD   4 mg at 08/31/20 0622   pantoprazole (PROTONIX) EC tablet 40 mg  40 mg Oral QHS Consuella Lose, MD   40 mg at 08/30/20 2135   senna (SENOKOT) tablet 8.6 mg  1 tablet Oral BID Consuella Lose, MD   8.6 mg at 08/31/20 0950   sodium chloride flush (NS) 0.9 %  injection 3 mL  3 mL Intravenous Q12H Consuella Lose, MD   3 mL at 08/31/20 0952   sodium chloride flush (NS) 0.9 % injection 3 mL  3 mL Intravenous PRN Consuella Lose, MD         Discharge Medications: Please see discharge summary for a list of discharge medications.  Relevant Imaging Results:  Relevant Lab Results:   Additional Information SSN [id number]  [place] COVID-19 Vaccine 07/17/2020 , 11/22/2019 , 05/26/2019 , 05/01/2019  [name], LCSW

## 2020-09-01 DIAGNOSIS — T8132XA Disruption of internal operation (surgical) wound, not elsewhere classified, initial encounter: Secondary | ICD-10-CM | POA: Diagnosis not present

## 2020-09-01 LAB — BASIC METABOLIC PANEL
Anion gap: 8 (ref 5–15)
BUN: 43 mg/dL — ABNORMAL HIGH (ref 8–23)
CO2: 24 mmol/L (ref 22–32)
Calcium: 8.8 mg/dL — ABNORMAL LOW (ref 8.9–10.3)
Chloride: 104 mmol/L (ref 98–111)
Creatinine, Ser: 2.67 mg/dL — ABNORMAL HIGH (ref 0.44–1.00)
GFR, Estimated: 18 mL/min — ABNORMAL LOW (ref >60)
Glucose, Bld: 49 mg/dL — ABNORMAL LOW (ref 70–99)
Potassium: 5.2 mmol/L — ABNORMAL HIGH (ref 3.5–5.1)
Sodium: 136 mmol/L (ref 135–145)

## 2020-09-01 LAB — GLUCOSE, CAPILLARY
Glucose-Capillary: 35 mg/dL — CL (ref 70–99)
Glucose-Capillary: 131 mg/dL — ABNORMAL HIGH (ref 70–99)
Glucose-Capillary: 52 mg/dL — ABNORMAL LOW (ref 70–99)
Glucose-Capillary: 83 mg/dL (ref 70–99)
Glucose-Capillary: 59 mg/dL — ABNORMAL LOW (ref 70–99)
Glucose-Capillary: 71 mg/dL (ref 70–99)
Glucose-Capillary: 110 mg/dL — ABNORMAL HIGH (ref 70–99)

## 2020-09-01 MED ORDER — INSULIN ASPART 100 UNIT/ML IJ SOLN: 0.0000 [IU] | Freq: Three times a day (TID) | INTRAMUSCULAR | Status: DC

## 2020-09-01 MED ORDER — ENSURE ENLIVE PO LIQD
237.0000 mL | Freq: Two times a day (BID) | ORAL | Status: DC
  Administered 2020-09-01 – 2020-09-03 (×4): 237 mL via ORAL
  Filled 2020-09-01: qty 237

## 2020-09-01 MED ORDER — DEXTROSE 50 % IV SOLN
25.0000 g | Freq: Once | INTRAVENOUS | Status: AC
  Administered 2020-09-01: 25 g via INTRAVENOUS

## 2020-09-01 NOTE — Progress Notes (Signed)
Date and time results received: 09/01/20 0513  Test: CBG Critical Value: 35  Name of Provider Notified: Nundkumar  Orders Received? Or Actions Taken?:  D50 25 gm given

## 2020-09-01 NOTE — Progress Notes (Signed)
Patient alert and oriented, comfortable in bed and no c/o pain at this time. Patient transfer to [phone] via bed with all belongings taking to room and given to staff member. Patient awaiting bed at facility. Patient's daughter notified of transfer and room location.

## 2020-09-01 NOTE — Progress Notes (Signed)
Checked BS 59 , alert and oriented, ambulatory. Offered Ensure and able to consume supplement. Patient having dinner at this time with no discomforts.  Will re-check CBG after dinner.

## 2020-09-01 NOTE — Progress Notes (Signed)
At 0513, pt's CBG was 35; she was diaphoretic and stated she feels weak. She was given D50 solution 25 grams via IV at 0517 and some candies to chew on. Her blood sugar was rechecked again at 0550 and went up to 131. Will continue to monitor patient.

## 2020-09-01 NOTE — Progress Notes (Signed)
Overall patient stable.  Pain well controlled.  She remains afebrile.  Her vital signs are stable.  Motor and sensory function are intact.  Vac appears to be working well without any complicating features.  Overall stable following wound revision.  Continue VAC wound care.  Patient awaiting nursing home placement which will probably be early this week.  Transfer to floor.

## 2020-09-01 NOTE — Progress Notes (Signed)
CBG re-checked 71. Back in bed and able to move with minimal help.

## 2020-09-01 NOTE — Plan of Care (Signed)
  Problem: Activity: Goal: Risk for activity intolerance will decrease Outcome: Progressing   

## 2020-09-02 DIAGNOSIS — T8132XA Disruption of internal operation (surgical) wound, not elsewhere classified, initial encounter: Secondary | ICD-10-CM | POA: Diagnosis not present

## 2020-09-02 LAB — GLUCOSE, CAPILLARY
Glucose-Capillary: 77 mg/dL (ref 70–99)
Glucose-Capillary: 61 mg/dL — ABNORMAL LOW (ref 70–99)
Glucose-Capillary: 68 mg/dL — ABNORMAL LOW (ref 70–99)
Glucose-Capillary: 76 mg/dL (ref 70–99)
Glucose-Capillary: 59 mg/dL — ABNORMAL LOW (ref 70–99)
Glucose-Capillary: 177 mg/dL — ABNORMAL HIGH (ref 70–99)
Glucose-Capillary: 60 mg/dL — ABNORMAL LOW (ref 70–99)
Glucose-Capillary: 60 mg/dL — ABNORMAL LOW (ref 70–99)
Glucose-Capillary: 124 mg/dL — ABNORMAL HIGH (ref 70–99)

## 2020-09-02 MED ORDER — DEXTROSE 50 % IV SOLN
INTRAVENOUS | Status: AC
  Administered 2020-09-02: 50 mL
  Filled 2020-09-02: qty 50

## 2020-09-02 MED ORDER — DEXTROSE 50 % IV SOLN
INTRAVENOUS | Status: AC
  Administered 2020-09-02: 25 mL
  Filled 2020-09-02: qty 50

## 2020-09-02 MED ORDER — DEXTROSE 50 % IV SOLN: 12.5000 g | INTRAVENOUS | Status: AC

## 2020-09-02 MED ORDER — DEXTROSE 50 % IV SOLN: 1.0000 | Freq: Once | INTRAVENOUS | Status: AC

## 2020-09-02 NOTE — Progress Notes (Signed)
   Providing Compassionate, Quality Care - Together    I spoke with Dr. Rhea Bleacher of [facility] for a Peer to Peer regarding Ms. Mauro discharging to a skilled nursing facility. Based on Ms. Skarda current therapy evaluations, they do not feel that she needs skilled nursing facility level of care. She was ambulating 100' with a walker/minimal assist and is now ambulating 74' with a walker/minimal assist based on therapy notes. The wound VAC does not change the verdict. They are willing to approve Ms. Landgrebe return to long term care at [facility] with therapies and wound VAC dressing changes three times a week. If Ms. Ford situation changes, the claim can be resubmitted.    Viona Gilmore, DNP, AGNP-C Nurse Practitioner 09/02/2020 12:54 PM  [facility] [place] [address], [place], [place] [address] P: [phone]    F: [phone]

## 2020-09-02 NOTE — Progress Notes (Signed)
Hypoglycemic Event  CBG: 56  Treatment: D50 50 mL (25 gm)  Symptoms: None  Follow-up CBG: Time:1644 CBG Result:177  Possible Reasons for Event: Unknown  Comments/MD notified:hypoglycemic protocol followed.    Laurelyn Terrero S Chasin [place]

## 2020-09-02 NOTE — TOC Progression Note (Addendum)
Transition of Care [facility]) - Progression Note    Patient Details  Name: Jacqueline Richmond MRN: [id number] Date of Birth: 03/07/1946  Transition of [facility]) CM/SW Contact  [name], LCSW Phone Number: 09/02/2020, 9:02 AM  Clinical Narrative:   CSW received call from [facility] regarding SNF authorization.  Everlene Balls is offering peer to peer review on this request which needs to be completed by 12 noon on Monday, 6/13.  MD please call [phone], option 5.    1515: TC [employer], confirms that they denied SNF auth.  Family can appeal by calling [phone].    Expected Discharge Plan: Skilled Nursing Facility Barriers to Discharge: Other (must enter comment) (SNF pending insurance auth)  Expected Discharge Plan and Services Expected Discharge Plan: [place] In-house Referral: Clinical Social Work   Post Acute Care Choice: [place] Living arrangements for the past 2 months: [place] (Pt has been at [facility] for past 2 weeks) Expected Discharge Date: 08/31/20               DME Arranged: Other see comment (wound vac) DME Agency: [employer] Date DME Agency Contacted: 08/31/20 Time DME Agency Contacted: 0900 Representative spoke with at DME Agency: [name] [facility] Arranged: NA           Social Determinants of Health ([place]) Interventions    Readmission Risk Interventions No flowsheet data found.

## 2020-09-02 NOTE — Plan of Care (Signed)
  Problem: Activity: Goal: Risk for activity intolerance will decrease Outcome: Progressing   

## 2020-09-02 NOTE — Progress Notes (Signed)
Hypoglycemic Event  CBG: 61  Treatment: juice8 oz juice/soda  Symptoms: None  Follow-up CBG: Time:1300 CBG Result:76  Possible Reasons for Event: Unknown  Comments/MD notified: yes    [place]

## 2020-09-02 NOTE — Progress Notes (Signed)
   Providing Compassionate, Quality Care - Together   Subjective: Patient reports no issues overnight. She is up to chair, donning TLSO brace.  Objective: Vital signs in last 24 hours: Temp:  [97.8 F (36.6 C)-98.4 F (36.9 C)] 97.8 F (36.6 C) (06/12 1101) Pulse Rate:  [67-83] 77 (06/12 1101) Resp:  [16-18] 18 (06/12 1101) BP: (112-138)/(36-64) 128/59 (06/12 1101) SpO2:  [92 %-97 %] 94 % (06/12 1101)  Intake/Output from previous day: 06/11 0701 - 06/12 0700 In: 954 [P.O.:954] Out: 50 [Drains:50] Intake/Output this shift: Total I/O In: 120 [P.O.:120] Out: -   Alert and oriented Speech clear MAE, motor and sensory function appear intact Wound VAC in place, cannister with small amount of serosanguinous drainage   Lab Results: Recent Labs    08/30/20 1339 08/30/20 2057  WBC 9.8 8.7  HGB 9.1* 9.3*  HCT 30.3* 30.6*  PLT 265 267   BMET Recent Labs    08/31/20 0507 09/01/20 1514  NA 133* 136  K 6.2* 5.2*  CL 102 104  CO2 22 24  GLUCOSE 132* 49*  BUN 47* 43*  CREATININE 3.13* 2.67*  CALCIUM 8.9 8.8*    Studies/Results: No results found.  Assessment/Plan: Patient is stable following wound revision by Dr. Kathyrn Sheriff. It appears a Peer to Peer is needed for some reason. The patient came from SNF and her therapy and nursing care needs are the same, if not greater now with the wound vac. It is unclear why a Peer to Peer is necessary.   LOS: 0 days   -I have called the number listed for Peer to Peer. I am awaiting call back from a provider. -Continue wound VAC care. -Continue to mobilize.   Viona Gilmore, DNP, AGNP-C Nurse Practitioner  [facility] [place] [address], [place], [place],  [address] P: [phone]    F: [phone]  09/02/2020, 11:30 AM

## 2020-09-02 NOTE — Progress Notes (Signed)
CBG 68 , patient having lunch and re-checked went down to 61. Patient denies any discomfort, alert and oriented. Fluids offered and will check CBG afterwards.Patient had glucotrol tab in the morning. Will notify MD.

## 2020-09-03 DIAGNOSIS — T8132XA Disruption of internal operation (surgical) wound, not elsewhere classified, initial encounter: Secondary | ICD-10-CM | POA: Diagnosis not present

## 2020-09-03 LAB — GLUCOSE, CAPILLARY
Glucose-Capillary: 34 mg/dL — CL (ref 70–99)
Glucose-Capillary: 108 mg/dL — ABNORMAL HIGH (ref 70–99)
Glucose-Capillary: 130 mg/dL — ABNORMAL HIGH (ref 70–99)
Glucose-Capillary: 121 mg/dL — ABNORMAL HIGH (ref 70–99)
Glucose-Capillary: 132 mg/dL — ABNORMAL HIGH (ref 70–99)
Glucose-Capillary: 104 mg/dL — ABNORMAL HIGH (ref 70–99)
Glucose-Capillary: 81 mg/dL (ref 70–99)

## 2020-09-03 MED ORDER — ADULT MULTIVITAMIN W/MINERALS CH: 1.0000 | ORAL_TABLET | Freq: Every day | ORAL | Status: DC

## 2020-09-03 MED ORDER — GLIPIZIDE 2.5 MG HALF TABLET: 2.5000 mg | ORAL_TABLET | Freq: Every day | ORAL | Status: DC

## 2020-09-03 MED ORDER — ENSURE ENLIVE PO LIQD: 237.0000 mL | Freq: Three times a day (TID) | ORAL | 12 refills | Status: DC

## 2020-09-03 MED ORDER — ADULT MULTIVITAMIN W/MINERALS CH
1.0000 | ORAL_TABLET | Freq: Every day | ORAL | Status: DC
  Administered 2020-09-03 – 2020-09-04 (×2): 1 via ORAL
  Filled 2020-09-03 (×2): qty 1

## 2020-09-03 MED ORDER — INSULIN ASPART 100 UNIT/ML IJ SOLN
0.0000 [IU] | Freq: Three times a day (TID) | INTRAMUSCULAR | Status: DC
  Administered 2020-09-04: 3 [IU] via SUBCUTANEOUS
  Administered 2020-09-04: 2 [IU] via SUBCUTANEOUS

## 2020-09-03 MED ORDER — ENSURE ENLIVE PO LIQD
237.0000 mL | Freq: Three times a day (TID) | ORAL | Status: DC
  Administered 2020-09-03 (×2): 237 mL via ORAL

## 2020-09-03 NOTE — Progress Notes (Signed)
  NEUROSURGERY PROGRESS NOTE   No issues overnight. Pt with minimal back pain this am. Ready to be discharged from hospital.  EXAM:  BP (!) 145/64 (BP Location: Right Arm)   Pulse 88   Temp 97.7 F (36.5 C) (Oral)   Resp 16   Ht 5\' 3"  (1.6 m)   Wt 73.4 kg   SpO2 98%   BMI 28.66 kg/m   Awake, alert, oriented  Speech fluent, appropriate  CN grossly intact  MAE well Wound vac in place, maintaining suction  IMPRESSION:  75 y.o. female s/p wound debridement, VAC application. Reviewed PT/OT recs, insurance denied SNF level of care. Can be transferred back to [place] d/c glipizide, decrease Novolog  PLAN: - Can d/c back to [place].   Consuella Lose, MD [facility]

## 2020-09-03 NOTE — Progress Notes (Signed)
Initial Nutrition Assessment  DOCUMENTATION CODES:   Not applicable  INTERVENTION:   -Ensure Enlive po TID, each supplement provides 350 kcal and 20 grams of protein  -MVI with minerals daily -Magic cup TID with meals, each supplement provides 290 kcal and 9 grams of protein   NUTRITION DIAGNOSIS:   Increased nutrient needs related to wound healing, post-op healing as evidenced by estimated needs.  GOAL:   Patient will meet greater than or equal to 90% of their needs  MONITOR:   PO intake, Supplement acceptance, Labs, Weight trends, Skin, I & O's  REASON FOR ASSESSMENT:   Malnutrition Screening Tool    ASSESSMENT:   Jacqueline Richmond is a 75 y.o. female who previously underwent percutaneous instrumented stabilization of a T10 fracture.  She was seen in the outpatient neurosurgery clinic with wound dehiscence and a significant amount of eschar on her wound.  Attempt was made to treat her conservatively with antibiotics without success.  She therefore presents for wound debridement and placement of a wound VAC.  Pt admitted with status post percutaneous instrumented stabilization of T10 fracture with wound dehiscence and superficial wound infection.   6/9- s/p superficial wound exploration with wound vac application  Reviewed I/O's: +1.8 L x 24 hours and +3. L since admission  Drain output: 15 ml x 24 hours  Spoke with pt at bedside, who reports poor appetite over the past month due to nausea. Observed breakfast and lunch trays on tray table- pt consumed 50% of cereal and english muffin for breakfast and 25% of [place] and mashed potatoes for lunch. Documented meal completion 50-100%.  Per pt, she estimates that she has lost 50 pounds in the past month. Reviewed wt hx; pt has experienced a 3.2% wt loss over the past 3 months.   Discussed importance of good meal and supplement intake to promote healing. Pt is amenable to Ensure Enlive supplements.   Medications reviewed and  include b-complex with vitamin C, colace, ferrous sulfate, lasix furosemide, lasix, senokot, and lovaza.   Lab Results  Component Value Date   HGBA1C 5.4 08/01/2020   PTA DM medications are 5 mg glipizide daily.   Labs reviewed: CBGS: 60-130 (inpatient orders for glycemic control are 5 mg glipizide daily, 0-9 units insulin aspart TID with meals,).    NUTRITION - FOCUSED PHYSICAL EXAM:  Flowsheet Row Most Recent Value  Orbital Region No depletion  Upper Arm Region No depletion  Thoracic and Lumbar Region No depletion  Buccal Region No depletion  Temple Region No depletion  Clavicle Bone Region No depletion  Clavicle and Acromion Bone Region No depletion  Scapular Bone Region No depletion  Dorsal Hand No depletion  Patellar Region No depletion  Anterior Thigh Region No depletion  Posterior Calf Region No depletion  Edema (RD Assessment) Mild  Hair Reviewed  Eyes Reviewed  Mouth Reviewed  Skin Reviewed  Nails Reviewed       Diet Order:   Diet Order             Diet - low sodium heart healthy           Diet Carb Modified Fluid consistency: Thin; Room service appropriate? Yes  Diet effective now                   EDUCATION NEEDS:   Education needs have been addressed  Skin:  Skin Assessment: Skin Integrity Issues: Skin Integrity Issues:: Wound VAC, DTI DTI: rt heel Wound Vac: closed back  Last BM:  09/03/20  Height:   Ht Readings from Last 1 Encounters:  08/30/20 5\' 3"  (1.6 m)    Weight:   Wt Readings from Last 1 Encounters:  08/30/20 73.4 kg    Ideal Body Weight:  52.3 kg  BMI:  Body mass index is 28.66 kg/m.  Estimated Nutritional Needs:   Kcal:  1850-2050  Protein:  105-120 grams  Fluid:  > 1.8 L    Loistine Chance, RD, LDN, [place] Registered Dietitian II Certified Diabetes Care and Education Specialist Please refer to [facility] for RD and/or RD on-call/weekend/after hours pager

## 2020-09-03 NOTE — Progress Notes (Signed)
Inpatient Diabetes Program Recommendations  AACE/ADA: New Consensus Statement on Inpatient Glycemic Control (2015)  Target Ranges:  Prepandial:   less than 140 mg/dL      Peak postprandial:   less than 180 mg/dL (1-2 hours)      Critically ill patients:  140 - 180 mg/dL   Lab Results  Component Value Date   GLUCAP 121 (H) 09/03/2020   HGBA1C 5.4 08/01/2020    Review of Glycemic Control  Diabetes history: DM Outpatient Diabetes medications: glipizide 5 mg QAM, Januvia 50 mg QAM Current orders for Inpatient glycemic control: Novolog 0-9 units TID with meals and 0-5 HS, glipizide 2.5 mg QAM  Many hypos throughout this admission. HgbA1C - 5.4% D/C glipizide while inpatient.  Inpatient Diabetes Program Recommendations:    D/C glipizide while inpatient to prevent hypoglycemia. Decrease Novolog to 0-6 units TID  Will continue to follow.  Thank you. Lorenda Peck, RD, LDN, CDE Inpatient Diabetes Coordinator [phone]

## 2020-09-03 NOTE — TOC Progression Note (Addendum)
Transition of Care [facility]) - Progression Note    Patient Details  Name: Jacqueline Richmond MRN: [id number] Date of Birth: Feb 01, 1946  Transition of Care [facility]) CM/SW Contact  [name], LCSW Phone Number: 09/03/2020, 5:13 PM  Clinical Narrative:  Visited with patient, who was napping but awakened when her name was called. When asked, Jacqueline Richmond gave CSW permission to call her husband regarding the insurance denial for her to return to SNF. Call made to Jacqueline Richmond [phone]) regarding his wife's insurance denial and he asked that his daughter-in-law, Jacqueline Richmond [phone]) be contacted as she has been taking care of "all of this". Call made to Jacqueline Richmond and she indicated that patient's care at [facility] has been paid out of pocket and the information that per [facility], patient can't return private pay, but must get insurance auth for ST rehab. [name] reported that she talked with [place] with [facility] and was told that patient can return private pay. [name] indicated that going home is not an option for patient and insurance denied while she was at [facility] and she talked with [name] regarding this. [name] was informed that CSW would talk with the admissions director at [facility] and get back with her.   Attempted to reach [place], admissions director at [facility] (3:19 pm) using her mobile number and the call failed. Called [place] main number and asked for [place] and a message was left for [name] to call CSW (3:19 pm). Attempted to reach [facility] again (4:32 pm) and message left. CSW will continue attempts to reach the admissions director.   4:49 pm: Received call from [facility] regarding patient. CSW informed that the family had come to [facility] this afternoon to sign paperwork for patient to return rehab and wound care (private pay) for 30 days while they work on getting patient into an Emergency planning/management officer. CSW checked and no recent COVID  test - last test 6/9, and per [facility], a recent test needed. [facility] called (4:53 pm) and at this point, there are 18 transports ahead of patient.    Talked with patient's nurse and updated her regarding patient's discharge tomorrow and the need for a COVID test. CSW will follow-up with MD on Tuesday, 6/14 regarding patient discharging on Tuesday and request the d/c summary be updated and a COVID test ordered.         Expected Discharge Plan: Skilled Nursing Facility Barriers to Discharge: Other (must enter comment) (SNF pending insurance auth)  Expected Discharge Plan and Services Expected Discharge Plan: [place] In-house Referral: Clinical Social Work   Post Acute Care Choice: [place] Living arrangements for the past 2 months: [place] (Pt has been at [facility] for past 2 weeks) Expected Discharge Date: 09/03/20               DME Arranged: Other see comment (wound vac) DME Agency: [facility] Date DME Agency Contacted: 08/31/20 Time DME Agency Contacted: 0900 Representative spoke with at DME Agency: [name] Arranged: NA           Social Determinants of Health ([place]) Interventions    Readmission Risk Interventions No flowsheet data found.

## 2020-09-03 NOTE — Progress Notes (Signed)
Physical Therapy Treatment Patient Details Name: Jacqueline Richmond MRN: [id number] DOB: Jan 16, 1946 Today's Date: 09/03/2020    History of Present Illness Patient is a 75 y/o female who was admitted on 08/30/20 for wound debridement and application of wound vac after previous percutaneous stabilization of T10 fracture. PMH includes DM2, essential HTN, CKD, Rt THA 2004, T8-11 stabilization for T10 fx 08/01/20.    PT Comments    Continuing work on functional mobility and activity tolerance;  Pt very concerned about when her vac dressing would be changed, but agreeable to amb; Walked in the hallway, and noted she tended to keep RW too far in front with her trunk flexed; mod to max cues for posture and RW proximity; Ended session in chair for pt to eat dinner; She seemed pleased to be OOB   Follow Up Recommendations  SNF (return to [facility])     Equipment Recommendations  None recommended by PT    Recommendations for Other Services       Precautions / Restrictions Precautions Precautions: Fall;Back;Other (comment) Precaution Comments: reviewed precautions, wound vac, O2 Required Braces or Orthoses: Spinal Brace Spinal Brace: Thoracolumbosacral orthotic (per orders, no brace needed; Pt requested to put it on)    Mobility  Bed Mobility Overal bed mobility: Needs Assistance Bed Mobility: Rolling;Sidelying to Sit;Sit to Sidelying Rolling: Min assist Sidelying to sit: Mod assist;HOB elevated       General bed mobility comments: requires cueing for log roll technique, pt attempting to sit up with heavy use of rails and requires mod assist for trunk to ascend after log roll    Transfers Overall transfer level: Needs assistance Equipment used: Rolling walker (2 wheeled) Transfers: Sit to/from Stand Sit to Stand: Min assist         General transfer comment: Min A to power to standing with cues for hand placement as pt attempting to pull up on RW. Flexed trunk/hips in standing. cues to  wait and get inot optimal position directly in front of seat before sitting  Ambulation/Gait Ambulation/Gait assistance: Min guard Gait Distance (Feet): 50 Feet Assistive device: Rolling walker (2 wheeled) Gait Pattern/deviations: Step-through pattern;Decreased stride length;Trunk flexed Gait velocity: decreased   General Gait Details: Slow, mildly unsteady gait with RW for support; assist with turns. Cues for upright posture and RW proximity   Stairs             Wheelchair Mobility    Modified Rankin (Stroke Patients Only)       Balance     Sitting balance-Leahy Scale: Good       Standing balance-Leahy Scale: Poor                              Cognition Arousal/Alertness: Awake/alert Behavior During Therapy: WFL for tasks assessed/performed;Flat affect Overall Cognitive Status: No family/caregiver present to determine baseline cognitive functioning Area of Impairment: Memory                     Memory: Decreased short-term memory Following Commands: Follows one step commands consistently       General Comments: patient requires repetition at times to follow commands, with difficutly adhering to precautions functionally. Able to state 3/3 precautions.  Decreased safety awareness.      Exercises      General Comments General comments (skin integrity, edema, etc.): Vac dressing intact      Pertinent Vitals/Pain Pain Assessment: Faces Faces Pain Scale: Hurts  a little bit Pain Location: back Pain Descriptors / Indicators: Operative site guarding;Sore Pain Intervention(s): Monitored during session    Home Living                      Prior Function            PT Goals (current goals can now be found in the care plan section) Acute Rehab PT Goals Patient Stated Goal: to get better PT Goal Formulation: With patient/family Time For Goal Achievement: 09/14/20 Potential to Achieve Goals: Good Progress towards PT goals:  Progressing toward goals    Frequency    Min 3X/week      PT Plan Current plan remains appropriate    Co-evaluation              AM-PAC PT "6 Clicks" Mobility   Outcome Measure  Help needed turning from your back to your side while in a flat bed without using bedrails?: A Little Help needed moving from lying on your back to sitting on the side of a flat bed without using bedrails?: A Lot Help needed moving to and from a bed to a chair (including a wheelchair)?: A Little Help needed standing up from a chair using your arms (e.g., wheelchair or bedside chair)?: A Little Help needed to walk in hospital room?: A Little Help needed climbing 3-5 steps with a railing? : A Lot 6 Click Score: 16    End of Session Equipment Utilized During Treatment: Back brace;Gait belt Activity Tolerance: Patient tolerated treatment well Patient left: in chair;with call bell/phone within reach;with chair alarm set;Other (comment) (with dinner in front of her) Nurse Communication: Mobility status PT Visit Diagnosis: Pain;Difficulty in walking, not elsewhere classified (R26.2) Pain - part of body:  (back)     Time: 0076-2263 PT Time Calculation (min) (ACUTE ONLY): 20 min  Charges:  $Gait Training: 8-22 mins                     Roney Marion, [place] Pager [phone] Office [place] 09/03/2020, 6:07 PM

## 2020-09-03 NOTE — Progress Notes (Signed)
Hypoglycemic Event  CBG @ 2116 - 60  Patient drank one chocolate Ensure  CBG @ 2138 - 60  1/2 AMP D50 given  CBG @ 2243 - 124  Will continue to monitor patient.  Earleen Reaper

## 2020-09-04 DIAGNOSIS — T8132XA Disruption of internal operation (surgical) wound, not elsewhere classified, initial encounter: Secondary | ICD-10-CM | POA: Diagnosis not present

## 2020-09-04 LAB — GLUCOSE, CAPILLARY
Glucose-Capillary: 113 mg/dL — ABNORMAL HIGH (ref 70–99)
Glucose-Capillary: 199 mg/dL — ABNORMAL HIGH (ref 70–99)
Glucose-Capillary: 217 mg/dL — ABNORMAL HIGH (ref 70–99)
Glucose-Capillary: 183 mg/dL — ABNORMAL HIGH (ref 70–99)

## 2020-09-04 LAB — RESP PANEL BY RT-PCR (FLU A&B, COVID) ARPGX2
Influenza A by PCR: NEGATIVE
Influenza B by PCR: NEGATIVE
SARS Coronavirus 2 by RT PCR: NEGATIVE

## 2020-09-04 NOTE — Progress Notes (Signed)
Report called to [facility]. Pt. Awaiting transport.

## 2020-09-04 NOTE — Progress Notes (Signed)
Occupational Therapy Treatment Patient Details Name: Jacqueline Richmond MRN: [id number] DOB: 06-Dec-1945 Today's Date: 09/04/2020    History of present illness Patient is a 75 y/o female who was admitted on 08/30/20 for wound debridement and application of wound vac after previous percutaneous stabilization of T10 fracture. PMH includes DM2, essential HTN, CKD, Rt THA 2004, T8-11 stabilization for T10 fx 08/01/20.   OT comments  Pt making progress with functional goals. Session focused on bed mobility to sit EOB, donning back brace, UB ADLs, functional mobility with RW, BSC transfers, toileting tasks and back precautions. OT will continue to follow acutely to maximize level of function and safety  Follow Up Recommendations  SNF;Supervision/Assistance - 24 hour    Equipment Recommendations  Other (comment) (TBD at SNF)    Recommendations for Other Services      Precautions / Restrictions Precautions Precautions: Fall;Back;Other (comment) Precaution Comments: reviewed back precautions, wound vac, O2 Required Braces or Orthoses: Spinal Brace Spinal Brace: Thoracolumbosacral orthotic Restrictions Weight Bearing Restrictions: No       Mobility Bed Mobility Overal bed mobility: Needs Assistance Bed Mobility: Rolling;Sidelying to Sit;Sit to Sidelying Rolling: Min assist Sidelying to sit: Mod assist;HOB elevated     Sit to sidelying: Mod assist;HOB elevated General bed mobility comments: cues for log roll technique, mod A for trunk elevation    Transfers Overall transfer level: Needs assistance Equipment used: Rolling walker (2 wheeled) Transfers: Sit to/from Stand Sit to Stand: Min assist         General transfer comment: cues for hand placement as pt attempting to pull up on RW. Flexed trunk/hips in standing. cues to stanhd upright/no bending    Balance Overall balance assessment: Needs assistance Sitting-balance support: Feet supported;No upper extremity supported Sitting  balance-Leahy Scale: Good     Standing balance support: Bilateral upper extremity supported;During functional activity Standing balance-Leahy Scale: Poor                             ADL either performed or assessed with clinical judgement   ADL Overall ADL's : Needs assistance/impaired     Grooming: Wash/dry hands;Wash/dry face;Min guard;Standing       Lower Body Bathing: Moderate assistance;Sit to/from stand   Upper Body Dressing : Min guard;Sitting       Toilet Transfer: Minimal assistance;Ambulation;RW;BSC   Toileting- Clothing Manipulation and Hygiene: Minimal assistance;Sit to/from stand       Functional mobility during ADLs: Minimal assistance;Rolling walker;Cueing for safety;Cueing for sequencing General ADL Comments: pt limited by back precautions, pain, decreased activity tolerance, balance and cognition     Vision Patient Visual Report: No change from baseline     Perception     Praxis      Cognition Arousal/Alertness: Awake/alert Behavior During Therapy: WFL for tasks assessed/performed;Flat affect Overall Cognitive Status: No family/caregiver present to determine baseline cognitive functioning                       Memory: Decreased short-term memory Following Commands: Follows one step commands consistently Safety/Judgement: Decreased awareness of safety;Decreased awareness of deficits   Problem Solving: Slow processing;Difficulty sequencing;Requires verbal cues General Comments: patient requires repetition at times to follow commands, with difficutly adhering to precautions functionally. Able to state 3/3 precautions.  Decreased safety awareness.        Exercises     Shoulder Instructions       General Comments  Pertinent Vitals/ Pain       Pain Assessment: Faces Faces Pain Scale: Hurts little more Pain Location: back Pain Descriptors / Indicators: Operative site guarding;Sore Pain Intervention(s): Monitored  during session;Repositioned  Home Living                                          Prior Functioning/Environment              Frequency  Min 2X/week        Progress Toward Goals  OT Goals(current goals can now be found in the care plan section)  Progress towards OT goals: Progressing toward goals  Acute Rehab OT Goals Patient Stated Goal: to get better  Plan Discharge plan remains appropriate    Co-evaluation                 AM-PAC OT "6 Clicks" Daily Activity     Outcome Measure   Help from another person eating meals?: None Help from another person taking care of personal grooming?: A Little Help from another person toileting, which includes using toliet, bedpan, or urinal?: A Little Help from another person bathing (including washing, rinsing, drying)?: A Little Help from another person to put on and taking off regular upper body clothing?: None Help from another person to put on and taking off regular lower body clothing?: A Lot 6 Click Score: 19    End of Session Equipment Utilized During Treatment: Gait belt;Rolling walker;Back brace;Other (comment) (BSC)  OT Visit Diagnosis: Other abnormalities of gait and mobility (R26.89);Muscle weakness (generalized) (M62.81);Pain;History of falling (Z91.81) Pain - part of body:  (back)   Activity Tolerance Patient limited by fatigue   Patient Left in bed;with call bell/phone within reach;with family/visitor present;with SCD's reapplied   Nurse Communication          Time: 7897-8478 OT Time Calculation (min): 25 min  Charges: OT General Charges $OT Visit: 1 Visit OT Treatments $Self Care/Home Management : 8-22 mins $Therapeutic Activity: 8-22 mins     Britt Bottom 09/04/2020, 2:22 PM

## 2020-09-04 NOTE — TOC Transition Note (Addendum)
Transition of Care (TOC) - CM/SW Discharge Note *Discharged to [facility] *Phone number for report: [phone]   Patient Details  Name: Jacqueline Richmond MRN: [id number] Date of Birth: 1946-03-14  Transition of Care [facility]) CM/SW Contact:  Sable Feil, LCSW Phone Number: 09/04/2020, 2:48 PM   Clinical Narrative:  Patient medically stable for discharge and returning to [facility] today. Discharge clinicals transmitted to facility and CSW talked with patient at the bedside and daughter-in-law [name] by phone regarding discharge and waiting for COVID test to result. Contacted daughter-in-law once transport arranged and message left.    Final next level of care: [place] ([place]) Barriers to Discharge: Barriers Resolved   Patient Goals and CMS Choice Patient states their goals for this hospitalization and ongoing recovery are:: Patient agreeable to returning to [facility] to continue rehab CMS Medicare.gov Compare Post Acute Care list provided to:: Other (Comment Required) (Not needed as patient returning to [facility]) Choice offered to / list presented to : NA  Discharge Placement   Existing PASRR number confirmed : 08/31/20          Patient chooses bed at: [facility] Patient to be transferred to facility by: Non-emergency ambulance transport Name of family member notified: [name] [name] [phone]) Patient and family notified of of transfer: 09/04/20  Discharge Plan and Services In-house Referral: Clinical Social Work   Post Acute Care Choice: [place]          DME Arranged: Other see comment (wound vac) DME Agency: [employer] Date DME Agency Contacted: 08/31/20 Time DME Agency Contacted: 0900 Representative spoke with at DME Agency: Winfield Rast Arranged: NA          Social Determinants of Health ([place]) Interventions  No SDOH interventions needed or requested at  discharge.   Readmission Risk Interventions No flowsheet data found.

## 2020-09-05 ENCOUNTER — Non-Acute Institutional Stay (SKILLED_NURSING_FACILITY): Payer: Medicare PPO | Admitting: Adult Health

## 2020-09-05 ENCOUNTER — Encounter: Payer: Self-pay | Admitting: Adult Health

## 2020-09-05 DIAGNOSIS — E1122 Type 2 diabetes mellitus with diabetic chronic kidney disease: Secondary | ICD-10-CM | POA: Diagnosis not present

## 2020-09-05 DIAGNOSIS — I10 Essential (primary) hypertension: Secondary | ICD-10-CM | POA: Diagnosis not present

## 2020-09-05 DIAGNOSIS — D62 Acute posthemorrhagic anemia: Secondary | ICD-10-CM

## 2020-09-05 DIAGNOSIS — I5032 Chronic diastolic (congestive) heart failure: Secondary | ICD-10-CM

## 2020-09-05 DIAGNOSIS — N1832 Chronic kidney disease, stage 3b: Secondary | ICD-10-CM

## 2020-09-05 DIAGNOSIS — N179 Acute kidney failure, unspecified: Secondary | ICD-10-CM

## 2020-09-05 DIAGNOSIS — S22079G Unspecified fracture of T9-T10 vertebra, subsequent encounter for fracture with delayed healing: Secondary | ICD-10-CM

## 2020-09-05 DIAGNOSIS — T8130XA Disruption of wound, unspecified, initial encounter: Secondary | ICD-10-CM | POA: Diagnosis not present

## 2020-09-05 NOTE — Progress Notes (Signed)
Location:  [place] Room Number: 528-U Place of Service:  SNF (31) Provider:  Durenda Age, DNP, FNP-BC  Patient Care Team: Bernerd Limbo, MD as PCP - General (Family Medicine)  Extended Emergency Contact Information Primary Emergency Contact: Wantz,Ronald Address: [address]          [place] Phone: [phone] Mobile Phone: [phone] Relation: Spouse Secondary Emergency Contact: Enrique Sack Mobile Phone: [phone] Relation: Son Interpreter needed? No  Code Status:  Full Code   Goals of care: Advanced Directive information Advanced Directives 09/05/2020  Does Patient Have a Medical Advance Directive? No  Would patient like information on creating a medical advance directive? No - Patient declined  Pre-existing out of facility DNR order (yellow form or pink MOST form) -     Chief Complaint  Patient presents with   Hospitalization Follow-up    Follow-up from recent hospital stay 08/30/2020-09/05/2020    HPI:  Pt is a 75 y.o. female who was re-admitted to [facility] on 09/04/20 post hospital admission 08/30/20 to 09/04/20 due to wound dehiscence for which she had wound debridement and application of wound VAC on 08/30/20 after previous percutaneous stabilization of T10 fracture. Of note, she was previously at [facility] having short-term rehabilitation  S/P T8-11 stabilization of her unstable T10 fracture on 08/01/2020.   She was seen in her room today prior to application of wound vac. She stated that her back pain level is 5/10.   Past Medical History:  Diagnosis Date   Allergy    Anemia    Arthritis    Cataract    biltaerally    CKD (chronic kidney disease) stage 3, GFR 30-59 ml/min ([place]) 08/29/2011   per patient, kidneys monitored by Dr Posey Pronto endocrinologist ,lov was  05-18-2018, "kidney fx stable"   Colon polyp    Complication of anesthesia    Difficult  intubation    small mouth opening   Diverticulosis    Gout    HTN (hypertension) 08/29/2011   Hyperlipemia    Psoriasis    Type II or unspecified type diabetes mellitus without mention of complication, not stated as uncontrolled 08/29/2011   Past Surgical History:  Procedure Laterality Date   APPLICATION OF WOUND VAC N/A 08/30/2020   Procedure: APPLICATION OF WOUND VAC;  Surgeon: Consuella Lose, MD;  Location: [place];  Service: Neurosurgery;  Laterality: N/A;   BREAST LUMPECTOMY WITH NEEDLE LOCALIZATION Right 05/07/2012   Procedure: BREAST LUMPECTOMY WITH NEEDLE LOCALIZATION;  Surgeon: Merrie Roof, MD;  Location: [place];  Service: General;  Laterality: Right;   CHOLECYSTECTOMY     COLONOSCOPY  2009   [place]    COLONOSCOPY WITH PROPOFOL N/A 05/20/2018   Procedure: COLONOSCOPY WITH PROPOFOL;  Surgeon: Milus Banister, MD;  Location: [facility];  Service: Endoscopy;  Laterality: N/A;   HERNIA REPAIR     HIP SURGERY     LAMINECTOMY WITH POSTERIOR LATERAL ARTHRODESIS LEVEL 4 N/A 08/01/2020   Procedure: LAMINECTOMY WITH POSTERIOR LATERAL ARTHRODESIS Thoracic nine- Thoracic eleven ;  Surgeon: Consuella Lose, MD;  Location: [place];  Service: Neurosurgery;  Laterality: N/A;   POLYPECTOMY  05/20/2018   Procedure: POLYPECTOMY;  Surgeon: Milus Banister, MD;  Location: [facility];  Service: Endoscopy;;   RHINOPLASTY     SALPINGOOPHORECTOMY     TOTAL HIP ARTHROPLASTY Right 2004   WOUND EXPLORATION N/A 08/30/2020   Procedure: SUPERFICIALWOUND EXPLORATION, APPLY WOUND VAC;  Surgeon:  Consuella Lose, MD;  Location: [place];  Service: Neurosurgery;  Laterality: N/A;    Allergies  Allergen Reactions   Codeine Itching   Iodinated Diagnostic Agents     Other reaction(s): Unknown   Pentazocine Other (See Comments)    Hallucinations   Statins     Outpatient Encounter Medications as of 09/05/2020  Medication Sig   allopurinol (ZYLOPRIM) 300 MG tablet Take 150 mg by mouth in the morning.  (0900)   atenolol (TENORMIN) 100 MG tablet Take 100 mg by mouth in the morning. (0900)   b complex vitamins tablet Take 1 tablet by mouth in the morning. (0900)   benzonatate (TESSALON) 100 MG capsule Take 100 mg by mouth every 8 (eight) hours as needed for cough.   bisacodyl (DULCOLAX) 10 MG suppository Place 10 mg rectally as needed for moderate constipation.   cloNIDine (CATAPRES) 0.2 MG tablet Take 1 tablet (0.2 mg total) by mouth 2 (two) times daily.   feeding supplement (ENSURE ENLIVE / ENSURE PLUS) LIQD Take 237 mLs by mouth 3 (three) times daily between meals.   ferrous sulfate 325 (65 FE) MG tablet Take 325 mg by mouth daily with breakfast. (0900)   furosemide (LASIX) 40 MG tablet Take 40 mg by mouth in the morning. (0900)   glipiZIDE (GLUCOTROL) 5 MG tablet Take 5 mg by mouth daily before breakfast. (1000)   HYDROcodone-acetaminophen (NORCO/VICODIN) 5-325 MG tablet Take 2 tablets by mouth every 6 (six) hours as needed for moderate pain or severe pain.   loratadine (CLARITIN) 10 MG tablet Take 10 mg by mouth in the morning. (0900)   magnesium hydroxide (MILK OF MAGNESIA) 400 MG/5ML suspension Take 30 mLs by mouth daily as needed for mild constipation.   mometasone (ELOCON) 0.1 % cream Apply 1 application topically daily as needed (psoriasis).   Multiple Vitamin (MULTIVITAMIN WITH MINERALS) TABS tablet Take 1 tablet by mouth daily.   Omega-3 1000 MG CAPS Take 1,000 mg by mouth 2 (two) times daily. (0900 & 2100)   ondansetron (ZOFRAN) 4 MG tablet Take 4 mg by mouth every 8 (eight) hours as needed for nausea or vomiting.   polyethylene glycol (MIRALAX / GLYCOLAX) 17 g packet Take 17 g by mouth daily.   sitaGLIPtin (JANUVIA) 50 MG tablet Take 50 mg by mouth in the morning. (0900)   No facility-administered encounter medications on file as of 09/05/2020.    Review of Systems  GENERAL: No change in appetite, no fatigue, no weight changes, no fever, chills or weakness MOUTH and THROAT:  Denies oral discomfort, gingival pain or bleeding, RESPIRATORY: no cough, SOB, DOE, wheezing, hemoptysis CARDIAC: No chest pain, edema or palpitations GI: No abdominal pain, diarrhea, constipation, heart burn, nausea or vomiting GU: Denies dysuria, frequency, hematuria, incontinence, or discharge NEUROLOGICAL: Denies dizziness, syncope, numbness, or headache PSYCHIATRIC: Denies feelings of depression or anxiety. No report of hallucinations, insomnia, paranoia, or agitation    Immunization History  Administered Date(s) Administered   Fluad Quad(high Dose 65+) 12/03/2018   Influenza Split 12/22/2008, 11/30/2009, 12/13/2010, 12/25/2011   Influenza, High Dose Seasonal PF 01/11/2015, 11/23/2017, 12/03/2018, 12/13/2019   Influenza, Seasonal, Injecte, Preservative Fre 12/16/2013   Influenza,inj,Quad PF,6+ Mos 12/11/2015, 11/14/2016   Influenza,inj,Quad PF,6-35 Mos 12/11/2015, 11/14/2016   Influenza,inj,quad, With Preservative 12/16/2013   PFIZER(Purple Top)SARS-COV-2 Vaccination 05/01/2019, 05/26/2019, 11/22/2019, 07/17/2020   Pneumococcal Conjugate-13 01/16/2012   Pneumococcal Polysaccharide-23 03/24/2005, 08/08/2014   Tdap 09/13/2010   Pertinent  Health Maintenance Due  Topic Date Due   FOOT EXAM  Never done   OPHTHALMOLOGY EXAM  Never done   MAMMOGRAM  Never done   DEXA SCAN  Never done   INFLUENZA VACCINE  10/22/2020   HEMOGLOBIN A1C  02/01/2021   COLONOSCOPY (Pts 45-51yrs Insurance coverage will need to be confirmed)  05/20/2028   PNA vac Low Risk Adult  Completed   No flowsheet data found.   Vitals:   09/05/20 1009  BP: 120/71  Pulse: 77  Resp: 18  Temp: (!) 97 F (36.1 C)  Weight: 161 lb (73 kg)  Height: 5\' 3"  (1.6 m)   Body mass index is 28.52 kg/m.  Physical Exam  GENERAL APPEARANCE: Well nourished. In no acute distress. Normal body habitus SKIN:  midback surgical incision with dressing. MOUTH and THROAT: Lips are without lesions. Oral mucosa is moist and  without lesions. Tongue is normal in shape, size, and color and without lesions RESPIRATORY: Breathing is even & unlabored, BS CTAB CARDIAC: RRR, no murmur,no extra heart sounds, no edema GI: Abdomen soft, normal BS, no masses, no tenderness, NEUROLOGICAL: There is no tremor. Speech is clear. Alert and oriented X 3. PSYCHIATRIC:  Affect and behavior are appropriate  Labs reviewed: Recent Labs    08/01/20 0559 08/01/20 1226 08/03/20 0804 08/04/20 0425 08/30/20 1339 08/30/20 2057 08/31/20 0507 09/01/20 1514  NA 137   < > 139   < > 131*  --  133* 136  K 5.6*   < > 4.2   < > 5.1  --  6.2* 5.2*  CL 107   < > 111   < > 101  --  102 104  CO2 22   < > 20*   < > 20*  --  22 24  GLUCOSE 194*   < > 76   < > 93  --  132* 49*  BUN 68*   < > 72*   < > 52*  --  47* 43*  CREATININE 2.68*   < > 2.04*   < > 3.30* 3.15* 3.13* 2.67*  CALCIUM 8.7*   < > 7.7*   < > 8.6*  --  8.9 8.8*  MG 1.9  --  2.0  --   --   --   --   --   PHOS 6.4*  --   --   --   --   --   --   --    < > = values in this interval not displayed.   Recent Labs    08/01/20 0559 08/03/20 1159  AST 21  --   ALT 15  --   ALKPHOS 86  --   BILITOT 0.7  --   PROT 6.4*  --   ALBUMIN 2.7* 2.4*   Recent Labs    08/06/20 2637 08/07/20 0208 08/08/20 0056 08/30/20 1339 08/30/20 2057  WBC 9.8 10.5 12.4* 9.8 8.7  NEUTROABS 7.2 7.2 9.3*  --   --   HGB 8.8* 8.5* 8.5* 9.1* 9.3*  HCT 27.3* 26.6* 26.9* 30.3* 30.6*  MCV 87.8 87.2 87.9 88.1 86.7  PLT 204 204 237 265 267   Lab Results  Component Value Date   TSH 1.188 08/01/2020   Lab Results  Component Value Date   HGBA1C 5.4 08/01/2020   Lab Results  Component Value Date   CHOL 116 08/31/2011   HDL 21 (L) 08/31/2011   LDLCALC 35 08/31/2011   TRIG 221 (H) 08/06/2020   CHOLHDL 5.5 08/31/2011    Significant Diagnostic Results in last  30 days:  No results found.  Assessment/Plan   1. Wound dehiscence S/P debridement and application of wound VAC on 08/30/20 -Closed  fracture of tenth thoracic vertebra with delayed healing, unspecified fracture morphology, subsequent encounter S/P percutaneous stabilization of fracture -   continue wound VAC and follow-up with neurosurgery in 2 weeks -   Continue Norco 5-325 mg 2 tabs every 6 hours PRN for pain  2. Primary hypertension -  BP 120/71 -   Continue atenolol 100 mg 1 tab daily  3. Acute blood loss anemia Last CBC Lab Results  Component Value Date   WBC 8.7 08/30/2020   HGB 9.3 (L) 08/30/2020   HCT 30.6 (L) 08/30/2020   MCV 86.7 08/30/2020   MCH 26.3 08/30/2020   RDW 16.6 (H) 08/30/2020   PLT 267 08/30/2020   -Continue ferrous sulfate 325 mg daily  4. Type 2 diabetes mellitus with stage 3b chronic kidney disease, without long-term current use of insulin (HCC) Lab Results  Component Value Date   HGBA1C 5.4 08/01/2020   -Continue Januvia 50 mg daily and discontinue glipizide due to AKI  5. Chronic diastolic (congestive) heart failure (HCC) -No SOB, continue furosemide 40 mg daily  6. AKI (acute kidney injury) [facility]) Lab Results  Component Value Date   NA 136 09/01/2020   K 5.2 (H) 09/01/2020   CO2 24 09/01/2020   GLUCOSE 49 (L) 09/01/2020   BUN 43 (H) 09/01/2020   CREATININE 2.67 (H) 09/01/2020   CALCIUM 8.8 (L) 09/01/2020   GFRNONAA 18 (L) 09/01/2020   GFRAA 31 (L) 05/03/2012   -  will discontinue Glipizide and monitor BMP      Family/ staff Communication: Discussed plan of care with resident and charge nurse.  Labs/tests ordered:  CBC and BMP  Goals of care:   None   Durenda Age, DNP, MSN, FNP-BC [facility] [phone] (Monday-Friday 8:00 a.m. - 5:00 p.m.) [phone] (after hours)

## 2020-09-05 NOTE — Op Note (Signed)
  NEUROSURGERY OPERATIVE NOTE   PREOP DIAGNOSIS:  Superficial wound infection Wound dehiscence   POSTOP DIAGNOSIS: Same  PROCEDURE: Debridement of lumbar wound Application of wound vac  SURGEON: Dr. Consuella Lose, MD  ASSISTANT: None  ANESTHESIA: General Endotracheal  EBL: Minimal  SPECIMENS: None  DRAINS: Wound VAC COMPLICATIONS: None immediate  CONDITION: Hemodynamically stable to  PACU  HISTORY: Jacqueline Richmond is a 75 y.o. female  who was previously admitted after a fall and suffered a T10 fracture requiring operative percutaneous stabilization from T8-T12.  Patient has since been discharged to skilled nursing facility at [facility] and was seen in the Outpatient Neurosurgery Clinic with dehiscence of the inferior aspect of her wound.  She therefore presents for debridement of her wound and application of a wound VAC.   PROCEDURE IN DETAIL: The patient was brought to the operating room. After induction of general anesthesia, the patient was positioned on the operative table in the prone position. All pressure points were meticulously padded.  The previous skin incision was then identified and prepped and draped in the usual sterile fashion.    After time-out was conducted, the dehiscent portion of the wound was noted to be in the inferior portion of the wound measuring approximately 5 cm.  There was a significant amount of eschar surrounding the dehiscent portion of the wound.  Using a 11. Blade scalpel, the eschar was ellipsed out, and healthy bleeding tissue was identified at the edges.  Any small remaining amount of eschar in the bed of the wound was also removed sharply.  The more superior aspect of the wound was probed and there did not appear to be any dehiscence or infection.  There was no dehiscence deeper than the subcutaneous layer.  At this point, active arterial bleeding and the skin edges was controlled easily with bipolar electrocautery.  The wound VAC sponge was  then cut to the appropriate size and placed over the wound.  Occlusive dressing was then placed and connected to the wound VA C.  Good suction was maintained.  At the end of the case all sponge, needle, and instrument counts were correct. The patient was then transferred to the stretcher, extubated, and taken to the post-anesthesia care unit in stable hemodynamic condition.  Consuella Lose, MD [facility]

## 2020-09-10 ENCOUNTER — Non-Acute Institutional Stay (SKILLED_NURSING_FACILITY): Payer: Medicare PPO | Admitting: Internal Medicine

## 2020-09-10 ENCOUNTER — Encounter: Payer: Self-pay | Admitting: Internal Medicine

## 2020-09-10 DIAGNOSIS — E1322 Other specified diabetes mellitus with diabetic chronic kidney disease: Secondary | ICD-10-CM | POA: Diagnosis not present

## 2020-09-10 DIAGNOSIS — T8130XA Disruption of wound, unspecified, initial encounter: Secondary | ICD-10-CM

## 2020-09-10 DIAGNOSIS — N183 Chronic kidney disease, stage 3 unspecified: Secondary | ICD-10-CM | POA: Diagnosis not present

## 2020-09-10 DIAGNOSIS — IMO0002 Reserved for concepts with insufficient information to code with codable children: Secondary | ICD-10-CM

## 2020-09-10 DIAGNOSIS — M1A0120 Idiopathic chronic gout, left shoulder, without tophus (tophi): Secondary | ICD-10-CM | POA: Diagnosis not present

## 2020-09-10 DIAGNOSIS — E1365 Other specified diabetes mellitus with hyperglycemia: Secondary | ICD-10-CM

## 2020-09-10 NOTE — Assessment & Plan Note (Addendum)
Glipizide high risk; D/C & monitor glucoses. No apparent discordance

## 2020-09-10 NOTE — Assessment & Plan Note (Addendum)
Medication List reviewed; Allopurinol dose should be less than 50 mg twice weekly according to Up To Date with this GFR of 14-18.  She will be assessed off the allopurinol.

## 2020-09-10 NOTE — Assessment & Plan Note (Signed)
Wound Care Nurse to monitor @ SNF

## 2020-09-10 NOTE — Progress Notes (Signed)
NURSING HOME LOCATION:  [facility] ROOM NUMBER:  [id number]  CODE STATUS:  Full Code  PCP:  Bernerd Limbo MD  This is a nursing facility follow up visit for Jacqueline Richmond readmission within 30 days.  Interim medical record and care since last SNF visit was updated with review of diagnostic studies and change in clinical status since last visit were documented.  HPI: She was rehospitalized 6/9 - 09/04/2020 for wound dehiscence which was documented at the [facility] outpatient clinic visit.  She had failed conservative treatment with antibiotics and was therefore admitted for wound debridement and wound VAC application.  The wound was in the context of the prior percutaneous stabilization instrumentation of T10 fracture by Dr. Kathyrn Sheriff. CKD stage IV was present with GFR ranging from 14 up to 18.  Creatinine varied from 2.67 up to 3.30.  At the time of her previous discharge from the hospital 5/18 the creatinine had been 1.53. She also exhibited anemia of chronic disease. Glucoses ranged from 49 up to 132.  The most current A1c was 5.4% on 5/11. Sulfamethoxazole trimethoprim was discontinued.  She was discharged back to the SNF for PT/OT as well as wound care by the Wound Care Nurse.  Review of systems: According to the Wound Care Nurse the wound is stable.  She has no significant symptoms at this time.  She states her glucoses have been 107 up to 120.  She denies any hypoglycemia.  She has had a minor cough with scant thick sputum.  She also describes loose stool today without frank diarrhea.  She denies any recent gout symptoms.  Constitutional: No fever, significant weight change, fatigue  Eyes: No redness, discharge, pain, vision change ENT/mouth: No nasal congestion,  purulent discharge, earache, change in hearing, sore throat  Cardiovascular: No chest pain, palpitations, paroxysmal nocturnal dyspnea, claudication, edema  Respiratory: No hemoptysis, DOE, significant  snoring, apnea   Gastrointestinal: No heartburn, dysphagia, abdominal pain, nausea /vomiting, rectal bleeding, melena Genitourinary: No dysuria, hematuria, pyuria, incontinence, nocturia Musculoskeletal: No joint stiffness, joint swelling, weakness, pain Dermatologic: No rash, pruritus, change in appearance of skin Neurologic: No dizziness, headache, syncope, seizures, numbness, tingling Psychiatric: No significant anxiety, depression, insomnia, anorexia Endocrine: No change in hair/skin/nails, excessive thirst, excessive hunger, excessive urination  Hematologic/lymphatic: No significant bruising, lymphadenopathy, abnormal bleeding Allergy/immunology: No itchy/watery eyes, significant sneezing, urticaria, angioedema  Physical exam:  Pertinent or positive findings: She was sitting in the wheelchair with no evidence of discomfort.  Dental hygiene is extremely good.  Heart sounds are distant.  Abdomen is protuberant.  She has 1+ edema at the sock line.  The MCP joint of the left index finger is prominent and there is some lateral deviation of the hand.  General appearance: Adequately nourished; no acute distress, increased work of breathing is present.   Lymphatic: No lymphadenopathy about the head, neck, axilla. Eyes: No conjunctival inflammation or lid edema is present. There is no scleral icterus. Ears:  External ear exam shows no significant lesions or deformities.   Nose:  External nasal examination shows no deformity or inflammation. Nasal mucosa are pink and moist without lesions, exudates Oral exam:  Lips and gums are healthy appearing. There is no oropharyngeal erythema or exudate. Neck:  No thyromegaly, masses, tenderness noted.    Heart:  Normal rate and regular rhythm. S1 and S2 normal without gallop, murmur, click, rub .  Lungs: Chest clear to auscultation without wheezes, rhonchi, rales, rubs. Abdomen: Bowel sounds are normal. Abdomen  is soft and nontender with no organomegaly,  hernias, masses. GU: Deferred  Extremities:  No cyanosis, clubbing Neurologic exam :Balance, Rhomberg, finger to nose testing could not be completed due to clinical state Skin: Warm & dry w/o tenting. No significant rash.  See summary under each active problem in the Problem List with associated updated therapeutic plan

## 2020-09-10 NOTE — Patient Instructions (Signed)
See assessment and plan under each diagnosis in the problem list and acutely for this visit 

## 2020-09-11 ENCOUNTER — Encounter: Payer: Self-pay | Admitting: Adult Health

## 2020-09-11 ENCOUNTER — Non-Acute Institutional Stay (SKILLED_NURSING_FACILITY): Payer: Medicare PPO | Admitting: Adult Health

## 2020-09-11 DIAGNOSIS — N179 Acute kidney failure, unspecified: Secondary | ICD-10-CM | POA: Diagnosis not present

## 2020-09-11 DIAGNOSIS — E875 Hyperkalemia: Secondary | ICD-10-CM

## 2020-09-11 DIAGNOSIS — E790 Hyperuricemia without signs of inflammatory arthritis and tophaceous disease: Secondary | ICD-10-CM | POA: Diagnosis not present

## 2020-09-11 LAB — CBC AND DIFFERENTIAL
HCT: 28 — AB (ref 36–46)
Hemoglobin: 8.6 — AB (ref 12.0–16.0)
Platelets: 323 (ref 150–399)
WBC: 6.6

## 2020-09-11 LAB — CBC: RBC: 3.28 — AB (ref 3.87–5.11)

## 2020-09-11 NOTE — Progress Notes (Signed)
Location:  [place] Room Number: 462-V Place of Service:  SNF (31) Provider:  Durenda Age, DNP, FNP-BC  Patient Care Team: Bernerd Limbo, MD as PCP - General (Family Medicine)  Extended Emergency Contact Information Primary Emergency Contact: Bayliss,Ronald Address: [address]          [place] Phone: [phone] Mobile Phone: [phone] Relation: Spouse Secondary Emergency Contact: Enrique Sack Mobile Phone: [phone] Relation: Son Interpreter needed? No  Code Status:  Full Code   Goals of care: Advanced Directive information Advanced Directives 09/11/2020  Does Patient Have a Medical Advance Directive? No  Would patient like information on creating a medical advance directive? No - Patient declined  Pre-existing out of facility DNR order (yellow form or pink MOST form) -     Chief Complaint  Patient presents with   Acute Visit    Elevated K+    HPI:  Pt is a 75 y.o. female seen today for elevated K 5.2, and uric acid 7.40, elevated, elevated. She was seen in the room today. She denies having any gouty pains on her joints. She used to be on allopurinol which was discontinued due to acute kidney injury. Latest  BMP showed creatinine 2.29 and GFR 20.43, 09/11/20.  She is currently having short-term rehabilitation.  She was admitted to [place] on 09/04/20 post hospital admission 08/30/20 to 09/04/2020 due to wound dehiscence for which she had wound debridement and application of wound VAC on 08/30/20 after previous percutaneous stabilization of T10 fracture.  Past Medical History:  Diagnosis Date   Allergy    Anemia    Arthritis    Cataract    biltaerally    CKD (chronic kidney disease) stage 3, GFR 30-59 ml/min ([place]) 08/29/2011   per patient, kidneys monitored by Dr Posey Pronto endocrinologist ,lov was  05-18-2018, "kidney fx stable"   Colon polyp    Complication of  anesthesia    Difficult intubation    small mouth opening   Diverticulosis    Gout    HTN (hypertension) 08/29/2011   Hyperlipemia    Psoriasis    Type II or unspecified type diabetes mellitus without mention of complication, not stated as uncontrolled 08/29/2011   Past Surgical History:  Procedure Laterality Date   APPLICATION OF WOUND VAC N/A 08/30/2020   Procedure: APPLICATION OF WOUND VAC;  Surgeon: Consuella Lose, MD;  Location: [place];  Service: Neurosurgery;  Laterality: N/A;   BREAST LUMPECTOMY WITH NEEDLE LOCALIZATION Right 05/07/2012   Procedure: BREAST LUMPECTOMY WITH NEEDLE LOCALIZATION;  Surgeon: Merrie Roof, MD;  Location: [facility];  Service: General;  Laterality: Right;   CHOLECYSTECTOMY     COLONOSCOPY  2009   [facility]    COLONOSCOPY WITH PROPOFOL N/A 05/20/2018   Procedure: COLONOSCOPY WITH PROPOFOL;  Surgeon: Milus Banister, MD;  [facility];  Service: Endoscopy;  Laterality: N/A;   HERNIA REPAIR     HIP SURGERY     LAMINECTOMY WITH POSTERIOR LATERAL ARTHRODESIS LEVEL 4 N/A 08/01/2020   Procedure: LAMINECTOMY WITH POSTERIOR LATERAL ARTHRODESIS Thoracic nine- Thoracic eleven ;  Surgeon: Consuella Lose, MD;  Location: [place];  Service: Neurosurgery;  Laterality: N/A;   POLYPECTOMY  05/20/2018   Procedure: POLYPECTOMY;  Surgeon: Milus Banister, MD;  [facility];  Service: Endoscopy;;   RHINOPLASTY     SALPINGOOPHORECTOMY     TOTAL HIP ARTHROPLASTY Right 2004   WOUND EXPLORATION N/A 08/30/2020  Procedure: SUPERFICIALWOUND EXPLORATION, APPLY WOUND VAC;  Surgeon: Consuella Lose, MD;  Location: [place];  Service: Neurosurgery;  Laterality: N/A;    Allergies  Allergen Reactions   Codeine Itching   Iodinated Diagnostic Agents     Other reaction(s): Unknown   Pentazocine Other (See Comments)    Hallucinations   Statins     Outpatient Encounter Medications as of 09/11/2020  Medication Sig   atenolol (TENORMIN) 100 MG tablet Take 100 mg by  mouth in the morning. (0900)   b complex vitamins tablet Take 1 tablet by mouth in the morning. (0900)   benzonatate (TESSALON) 100 MG capsule Take 100 mg by mouth every 8 (eight) hours as needed for cough.   bisacodyl (DULCOLAX) 10 MG suppository Place 10 mg rectally as needed for moderate constipation.   cloNIDine (CATAPRES) 0.2 MG tablet Take 1 tablet (0.2 mg total) by mouth 2 (two) times daily.   feeding supplement (ENSURE ENLIVE / ENSURE PLUS) LIQD Take 237 mLs by mouth 3 (three) times daily between meals.   ferrous sulfate 325 (65 FE) MG tablet Take 325 mg by mouth daily with breakfast. (0900)   furosemide (LASIX) 40 MG tablet Take 40 mg by mouth in the morning. (0900)   HYDROcodone-acetaminophen (NORCO/VICODIN) 5-325 MG tablet Take 2 tablets by mouth every 6 (six) hours as needed for moderate pain or severe pain.   loratadine (CLARITIN) 10 MG tablet Take 10 mg by mouth in the morning. (0900)   magnesium hydroxide (MILK OF MAGNESIA) 400 MG/5ML suspension Take 30 mLs by mouth daily as needed for mild constipation.   Menthol, Topical Analgesic, (BIOFREEZE) 4 % GEL Apply 1 application topically as directed. To right foot three times daily for pain   mometasone (ELOCON) 0.1 % cream Apply 1 application topically daily as needed (psoriasis).   Multiple Vitamin (MULTIVITAMIN WITH MINERALS) TABS tablet Take 1 tablet by mouth daily.   Omega-3 1000 MG CAPS Take 1,000 mg by mouth 2 (two) times daily. (0900 & 2100)   ondansetron (ZOFRAN) 4 MG tablet Take 4 mg by mouth every 8 (eight) hours as needed for nausea or vomiting.   polyethylene glycol (MIRALAX / GLYCOLAX) 17 g packet Take 17 g by mouth daily.   sitaGLIPtin (JANUVIA) 50 MG tablet Take 50 mg by mouth in the morning. (0900)   [EXPIRED] sodium polystyrene (KAYEXALATE) 15 GM/60ML suspension Take 15 g by mouth once.   allopurinol (ZYLOPRIM) 300 MG tablet Take 150 mg by mouth in the morning. (0900)   [DISCONTINUED] glipiZIDE (GLUCOTROL) 5 MG tablet  Take 5 mg by mouth daily before breakfast. (1000)   No facility-administered encounter medications on file as of 09/11/2020.    Review of Systems  GENERAL: No change in appetite, no fatigue, no weight changes, no fever, chills  MOUTH and THROAT: Denies oral discomfort, gingival pain or bleeding RESPIRATORY: no cough, SOB, DOE, wheezing, hemoptysis CARDIAC: No chest pain, edema or palpitations GI: No abdominal pain, diarrhea, constipation, heart burn, nausea or vomiting GU: Denies dysuria, frequency, hematuria, incontinence, or discharge NEUROLOGICAL: Denies dizziness, syncope, numbness, or headache PSYCHIATRIC: Denies feelings of depression or anxiety. No report of hallucinations, insomnia, paranoia, or agitation    Immunization History  Administered Date(s) Administered   Fluad Quad(high Dose 65+) 12/03/2018   Influenza Split 12/22/2008, 11/30/2009, 12/13/2010, 12/25/2011   Influenza, High Dose Seasonal PF 01/11/2015, 11/23/2017, 12/03/2018, 12/13/2019   Influenza, Seasonal, Injecte, Preservative Fre 12/16/2013   Influenza,inj,Quad PF,6+ Mos 12/11/2015, 11/14/2016   Influenza,inj,Quad PF,6-35 Mos 12/11/2015,  11/14/2016   Influenza,inj,quad, With Preservative 12/16/2013   PFIZER(Purple Top)SARS-COV-2 Vaccination 05/01/2019, 05/26/2019, 11/22/2019, 07/17/2020   Pneumococcal Conjugate-13 01/16/2012   Pneumococcal Polysaccharide-23 03/24/2005, 08/08/2014   Tdap 09/13/2010   Pertinent  Health Maintenance Due  Topic Date Due   FOOT EXAM  Never done   OPHTHALMOLOGY EXAM  Never done   MAMMOGRAM  Never done   DEXA SCAN  Never done   INFLUENZA VACCINE  10/22/2020   HEMOGLOBIN A1C  02/01/2021   COLONOSCOPY (Pts 45-44yrs Insurance coverage will need to be confirmed)  05/20/2028   PNA vac Low Risk Adult  Completed   No flowsheet data found.   Vitals:   09/11/20 1523  BP: 134/63  Pulse: 75  Resp: 20  Temp: 97.6 F (36.4 C)  Weight: 151 lb 3.2 oz (68.6 kg)  Height: 5\' 3"  (1.6  m)   Body mass index is 26.78 kg/m.  Physical Exam  GENERAL APPEARANCE: Well nourished. In no acute distress. Normal body habitus SKIN:  midback surgical incision attached to wound vac MOUTH and THROAT: Lips are without lesions. Oral mucosa is moist and without lesions.  RESPIRATORY: Breathing is even & unlabored, BS CTAB CARDIAC: RRR, no murmur,no extra heart sounds, no edema GI: Abdomen soft, normal BS, no masses, no tenderness NEUROLOGICAL: There is no tremor. Speech is clear. Alert and oriented X 3. PSYCHIATRIC:  Affect and behavior are appropriate  Labs reviewed: Recent Labs    08/01/20 0559 08/01/20 1226 08/03/20 0804 08/04/20 0425 08/30/20 1339 08/30/20 2057 08/31/20 0507 09/01/20 1514  NA 137   < > 139   < > 131*  --  133* 136  K 5.6*   < > 4.2   < > 5.1  --  6.2* 5.2*  CL 107   < > 111   < > 101  --  102 104  CO2 22   < > 20*   < > 20*  --  22 24  GLUCOSE 194*   < > 76   < > 93  --  132* 49*  BUN 68*   < > 72*   < > 52*  --  47* 43*  CREATININE 2.68*   < > 2.04*   < > 3.30* 3.15* 3.13* 2.67*  CALCIUM 8.7*   < > 7.7*   < > 8.6*  --  8.9 8.8*  MG 1.9  --  2.0  --   --   --   --   --   PHOS 6.4*  --   --   --   --   --   --   --    < > = values in this interval not displayed.   Recent Labs    08/01/20 0559 08/03/20 1159  AST 21  --   ALT 15  --   ALKPHOS 86  --   BILITOT 0.7  --   PROT 6.4*  --   ALBUMIN 2.7* 2.4*   Recent Labs    08/06/20 0652 08/07/20 0208 08/08/20 0056 08/30/20 1339 08/30/20 2057  WBC 9.8 10.5 12.4* 9.8 8.7  NEUTROABS 7.2 7.2 9.3*  --   --   HGB 8.8* 8.5* 8.5* 9.1* 9.3*  HCT 27.3* 26.6* 26.9* 30.3* 30.6*  MCV 87.8 87.2 87.9 88.1 86.7  PLT 204 204 237 265 267   Lab Results  Component Value Date   TSH 1.188 08/01/2020   Lab Results  Component Value Date   HGBA1C 5.4 08/01/2020   Lab  Results  Component Value Date   CHOL 116 08/31/2011   HDL 21 (L) 08/31/2011   LDLCALC 35 08/31/2011   TRIG 221 (H) 08/06/2020   CHOLHDL  5.5 08/31/2011     Assessment/Plan  1. Hyperkalemia Lab Results  Component Value Date   K 5.2 (H) 09/01/2020   - will give Kayexalate 15 gm/60 ml PO X 1  2. Elevated uric acid in blood -  uric acid 7.40, elevated -   will start on renally dosed Allopurinol 100 mg give 1/2 tab = 50 mg PO every other day -  repeat uric acis in 1 month  3. AKI (acute kidney injury) ([place]) -  creatinine 2.29, GFR 20.43 -  will monitor   Family/ staff Communication: Discussed plan of care with resident and charge nurse.  Labs/tests ordered: Uric acid in 1 month  Goals of care:   Short-term care   Durenda Age, DNP, MSN, FNP-BC [facility] [phone] (Monday-Friday 8:00 a.m. - 5:00 p.m.) [phone] (after hours)

## 2020-09-12 NOTE — Assessment & Plan Note (Signed)
No active gout symptoms D/C Allopurinol due to progressive CKD

## 2020-09-19 DIAGNOSIS — Z6827 Body mass index (BMI) 27.0-27.9, adult: Secondary | ICD-10-CM | POA: Insufficient documentation

## 2020-09-25 ENCOUNTER — Encounter: Payer: Self-pay | Admitting: Adult Health

## 2020-09-25 ENCOUNTER — Non-Acute Institutional Stay (SKILLED_NURSING_FACILITY): Payer: Medicare PPO | Admitting: Adult Health

## 2020-09-25 DIAGNOSIS — N189 Chronic kidney disease, unspecified: Secondary | ICD-10-CM | POA: Diagnosis not present

## 2020-09-25 DIAGNOSIS — E875 Hyperkalemia: Secondary | ICD-10-CM

## 2020-09-25 DIAGNOSIS — R600 Localized edema: Secondary | ICD-10-CM

## 2020-09-25 DIAGNOSIS — N179 Acute kidney failure, unspecified: Secondary | ICD-10-CM | POA: Diagnosis not present

## 2020-09-25 NOTE — Progress Notes (Addendum)
Location:  [place] Room Number: 938 A Place of Service:  SNF (31) Provider:  Durenda Age, DNP, FNP-BC  Patient Care Team: Bernerd Limbo, MD as PCP - General (Family Medicine)  Extended Emergency Contact Information Primary Emergency Contact: Jackson,Ronald Address: [address]          [place] Phone: [phone] Mobile Phone: [phone] Relation: Spouse Secondary Emergency Contact: Enrique Sack Mobile Phone: [phone] Relation: Son Interpreter needed? No  Code Status:  Full Code  Goals of care: Advanced Directive information Advanced Directives 09/11/2020  Does Patient Have a Medical Advance Directive? No  Would patient like information on creating a medical advance directive? No - Patient declined  Pre-existing out of facility DNR order (yellow form or pink MOST form) -     Chief Complaint  Patient presents with   Acute Visit    Edema of lower extremities    HPI:  Pt is a 75 y.o. female seen today for edema of lower extremities, 2= on right and 1+ on left. Right lower leg noted weeping with clear drainage. No erythema, fever nor SOB. She denies pain on lower extremities. She is currently taking Lasix 40 mg daily for CHF.   She was re-admitted to [facility] on 09/04/20 post hospital admission 08/30/20 to 09/04/2020 for wound dehiscence for which she had wound debridement and application of wound VAC on 08/30/20 after previous percutaneous stabilization of T10 fracture.  Of note, she was previously at [facility] having short-term rehabilitation S/P T8-11 stabilization of her unstable T10 fracture on 08/02/2018.  Past Medical History:  Diagnosis Date   Allergy    Anemia    Arthritis    Cataract    biltaerally    CKD (chronic kidney disease) stage 3, GFR 30-59 ml/min ([place]) 08/29/2011   per patient, kidneys monitored by Dr Posey Pronto endocrinologist ,lov was   05-18-2018, "kidney fx stable"   Colon polyp    Complication of anesthesia    Difficult intubation    small mouth opening   Diverticulosis    Gout    HTN (hypertension) 08/29/2011   Hyperlipemia    Psoriasis    Type II or unspecified type diabetes mellitus without mention of complication, not stated as uncontrolled 08/29/2011   Past Surgical History:  Procedure Laterality Date   APPLICATION OF WOUND VAC N/A 08/30/2020   Procedure: APPLICATION OF WOUND VAC;  Surgeon: Consuella Lose, MD;  Location: [place];  Service: Neurosurgery;  Laterality: N/A;   BREAST LUMPECTOMY WITH NEEDLE LOCALIZATION Right 05/07/2012   Procedure: BREAST LUMPECTOMY WITH NEEDLE LOCALIZATION;  Surgeon: Merrie Roof, MD;  Location: [place];  Service: General;  Laterality: Right;   CHOLECYSTECTOMY     COLONOSCOPY  2009   [facility]    COLONOSCOPY WITH PROPOFOL N/A 05/20/2018   Procedure: COLONOSCOPY WITH PROPOFOL;  Surgeon: Milus Banister, MD;  [facility];  Service: Endoscopy;  Laterality: N/A;   HERNIA REPAIR     HIP SURGERY     LAMINECTOMY WITH POSTERIOR LATERAL ARTHRODESIS LEVEL 4 N/A 08/01/2020   Procedure: LAMINECTOMY WITH POSTERIOR LATERAL ARTHRODESIS Thoracic nine- Thoracic eleven ;  Surgeon: Consuella Lose, MD;  Location: [place];  Service: Neurosurgery;  Laterality: N/A;   POLYPECTOMY  05/20/2018   Procedure: POLYPECTOMY;  Surgeon: Milus Banister, MD;  [facility];  Service: Endoscopy;;   RHINOPLASTY     SALPINGOOPHORECTOMY     TOTAL HIP ARTHROPLASTY  Right 2004   WOUND EXPLORATION N/A 08/30/2020   Procedure: SUPERFICIALWOUND EXPLORATION, APPLY WOUND VAC;  Surgeon: Consuella Lose, MD;  Location: [place];  Service: Neurosurgery;  Laterality: N/A;    Allergies  Allergen Reactions   Codeine Itching   Iodinated Diagnostic Agents     Other reaction(s): Unknown   Pentazocine Other (See Comments)    Hallucinations   Statins     Outpatient Encounter Medications as of 09/25/2020   Medication Sig   allopurinol (ZYLOPRIM) 300 MG tablet Take 150 mg by mouth in the morning. (0900)   atenolol (TENORMIN) 100 MG tablet Take 100 mg by mouth in the morning. (0900)   b complex vitamins tablet Take 1 tablet by mouth in the morning. (0900)   benzonatate (TESSALON) 100 MG capsule Take 100 mg by mouth every 8 (eight) hours as needed for cough.   bisacodyl (DULCOLAX) 10 MG suppository Place 10 mg rectally as needed for moderate constipation.   cloNIDine (CATAPRES) 0.2 MG tablet Take 1 tablet (0.2 mg total) by mouth 2 (two) times daily.   feeding supplement (ENSURE ENLIVE / ENSURE PLUS) LIQD Take 237 mLs by mouth 3 (three) times daily between meals.   ferrous sulfate 325 (65 FE) MG tablet Take 325 mg by mouth daily with breakfast. (0900)   furosemide (LASIX) 40 MG tablet Take 40 mg by mouth in the morning. (0900)   HYDROcodone-acetaminophen (NORCO/VICODIN) 5-325 MG tablet Take 2 tablets by mouth every 6 (six) hours as needed for moderate pain or severe pain.   loratadine (CLARITIN) 10 MG tablet Take 10 mg by mouth in the morning. (0900)   magnesium hydroxide (MILK OF MAGNESIA) 400 MG/5ML suspension Take 30 mLs by mouth daily as needed for mild constipation.   Menthol, Topical Analgesic, (BIOFREEZE) 4 % GEL Apply 1 application topically as directed. To right foot three times daily for pain   mometasone (ELOCON) 0.1 % cream Apply 1 application topically daily as needed (psoriasis).   Multiple Vitamin (MULTIVITAMIN WITH MINERALS) TABS tablet Take 1 tablet by mouth daily.   Omega-3 1000 MG CAPS Take 1,000 mg by mouth 2 (two) times daily. (0900 & 2100)   ondansetron (ZOFRAN) 4 MG tablet Take 4 mg by mouth every 8 (eight) hours as needed for nausea or vomiting.   polyethylene glycol (MIRALAX / GLYCOLAX) 17 g packet Take 17 g by mouth daily.   sitaGLIPtin (JANUVIA) 50 MG tablet Take 50 mg by mouth in the morning. (0900)   No facility-administered encounter medications on file as of 09/25/2020.     Review of Systems  GENERAL: No change in appetite, no fatigue, no fever, chills or weakness MOUTH and THROAT: Denies oral discomfort, gingival pain or bleeding RESPIRATORY: no cough, SOB, DOE, wheezing, hemoptysis CARDIAC: No chest pain or palpitations GI: No abdominal pain, diarrhea, constipation, heart burn, nausea or vomiting GU: Denies dysuria, frequency, hematuria, incontinence, or discharge NEUROLOGICAL: Denies dizziness, syncope, numbness, or headache PSYCHIATRIC: Denies feelings of depression or anxiety. No report of hallucinations, insomnia, paranoia, or agitation   Immunization History  Administered Date(s) Administered   Fluad Quad(high Dose 65+) 12/03/2018   Influenza Split 12/22/2008, 11/30/2009, 12/13/2010, 12/25/2011   Influenza, High Dose Seasonal PF 01/11/2015, 11/23/2017, 12/03/2018, 12/13/2019   Influenza, Seasonal, Injecte, Preservative Fre 12/16/2013   Influenza,inj,Quad PF,6+ Mos 12/11/2015, 11/14/2016   Influenza,inj,Quad PF,6-35 Mos 12/11/2015, 11/14/2016   Influenza,inj,quad, With Preservative 12/16/2013   PFIZER(Purple Top)SARS-COV-2 Vaccination 05/01/2019, 05/26/2019, 11/22/2019, 07/17/2020   Pneumococcal Conjugate-13 01/16/2012   Pneumococcal Polysaccharide-23 03/24/2005,  08/08/2014   Tdap 09/13/2010   Pertinent  Health Maintenance Due  Topic Date Due   FOOT EXAM  Never done   OPHTHALMOLOGY EXAM  Never done   MAMMOGRAM  Never done   DEXA SCAN  Never done   INFLUENZA VACCINE  10/22/2020   HEMOGLOBIN A1C  02/01/2021   COLONOSCOPY (Pts 45-36yrs Insurance coverage will need to be confirmed)  05/20/2028   PNA vac Low Risk Adult  Completed   No flowsheet data found.   Vitals:   09/25/20 1000  BP: (!) 121/59  Pulse: 63  Resp: 18  Temp: (!) 97.5 F (36.4 C)  Weight: 154 lb 6.4 oz (70 kg)  Height: 5\' 3"  (1.6 m)   Body mass index is 27.35 kg/m.  Physical Exam  GENERAL APPEARANCE: Well nourished. In no acute distress. Normal body  habitus SKIN:  midback surgical wound attached to wound vac, weeping right lower leg with clear drainage, moderate MOUTH and THROAT: Lips are without lesions. Oral mucosa is moist and without lesions.  RESPIRATORY: Breathing is even & unlabored, BS CTAB CARDIAC: RRR, no murmur,no extra heart sounds, BLE edema, Right 2+ and left 1+ GI: Abdomen soft, normal BS, no masses, no tenderness EXTREMITIES:  Able to move X 4 extremities NEUROLOGICAL: There is no tremor. Speech is clear. Alert and oriented X 3.  PSYCHIATRIC: Affect and behavior are appropriate  Labs reviewed: Recent Labs    08/01/20 0559 08/01/20 1226 08/03/20 0804 08/04/20 0425 08/30/20 1339 08/30/20 2057 08/31/20 0507 09/01/20 1514  NA 137   < > 139   < > 131*  --  133* 136  K 5.6*   < > 4.2   < > 5.1  --  6.2* 5.2*  CL 107   < > 111   < > 101  --  102 104  CO2 22   < > 20*   < > 20*  --  22 24  GLUCOSE 194*   < > 76   < > 93  --  132* 49*  BUN 68*   < > 72*   < > 52*  --  47* 43*  CREATININE 2.68*   < > 2.04*   < > 3.30* 3.15* 3.13* 2.67*  CALCIUM 8.7*   < > 7.7*   < > 8.6*  --  8.9 8.8*  MG 1.9  --  2.0  --   --   --   --   --   PHOS 6.4*  --   --   --   --   --   --   --    < > = values in this interval not displayed.   Recent Labs    08/01/20 0559 08/03/20 1159  AST 21  --   ALT 15  --   ALKPHOS 86  --   BILITOT 0.7  --   PROT 6.4*  --   ALBUMIN 2.7* 2.4*   Recent Labs    08/06/20 0652 08/07/20 0208 08/08/20 0056 08/30/20 1339 08/30/20 2057  WBC 9.8 10.5 12.4* 9.8 8.7  NEUTROABS 7.2 7.2 9.3*  --   --   HGB 8.8* 8.5* 8.5* 9.1* 9.3*  HCT 27.3* 26.6* 26.9* 30.3* 30.6*  MCV 87.8 87.2 87.9 88.1 86.7  PLT 204 204 237 265 267   Lab Results  Component Value Date   TSH 1.188 08/01/2020   Lab Results  Component Value Date   HGBA1C 5.4 08/01/2020   Lab Results  Component  Value Date   CHOL 116 08/31/2011   HDL 21 (L) 08/31/2011   LDLCALC 35 08/31/2011   TRIG 221 (H) 08/06/2020   CHOLHDL 5.5  08/31/2011    Significant Diagnostic Results in last 30 days:  No results found.  Assessment/Plan  1. Bilateral lower extremity edema -    Will start Lasix 40 mg 1 tab daily x3 days -  will apply xeroform to right lower extremity, then kerlix, then ACE wrap daily  2. Hyperkalemia Lab Results  Component Value Date   K 5.2 (H) 09/01/2020   -  will monitor  3. Acute kidney injury superimposed on chronic kidney disease [facility]) Lab Results  Component Value Date   NA 136 09/01/2020   K 5.2 (H) 09/01/2020   CO2 24 09/01/2020   GLUCOSE 49 (L) 09/01/2020   BUN 43 (H) 09/01/2020   CREATININE 2.67 (H) 09/01/2020   CALCIUM 8.8 (L) 09/01/2020   GFRNONAA 18 (L) 09/01/2020   GFRAA 31 (L) 05/03/2012   -   will monitor     Family/ staff Communication: Discussed plan of care with resident and charge nurse.  Labs/tests ordered: BMP on 09/28/2020, venous ultrasound of bilateral lower extremity to rule out DVT  Goals of care:   Short-term care   Durenda Age, DNP, MSN, FNP-BC [facility] [phone] (Monday-Friday 8:00 a.m. - 5:00 p.m.) [phone] (after hours)

## 2020-09-29 LAB — COMPREHENSIVE METABOLIC PANEL
Calcium: 9.5 (ref 8.7–10.7)
GFR calc Af Amer: 29.66
GFR calc non Af Amer: 25.59

## 2020-09-29 LAB — BASIC METABOLIC PANEL
BUN: 56 — AB (ref 4–21)
CO2: 24 — AB (ref 13–22)
Chloride: 96 — AB (ref 99–108)
Creatinine: 1.9 — AB (ref <=1.1)
Glucose: 139
Potassium: 4.9 (ref 3.4–5.3)
Sodium: 134 — AB (ref 137–147)

## 2020-10-05 ENCOUNTER — Encounter: Payer: Self-pay | Admitting: Adult Health

## 2020-10-05 ENCOUNTER — Non-Acute Institutional Stay (SKILLED_NURSING_FACILITY): Payer: Medicare PPO | Admitting: Adult Health

## 2020-10-05 DIAGNOSIS — I5032 Chronic diastolic (congestive) heart failure: Secondary | ICD-10-CM | POA: Diagnosis not present

## 2020-10-05 DIAGNOSIS — R600 Localized edema: Secondary | ICD-10-CM | POA: Diagnosis not present

## 2020-10-05 DIAGNOSIS — E875 Hyperkalemia: Secondary | ICD-10-CM | POA: Diagnosis not present

## 2020-10-05 DIAGNOSIS — N184 Chronic kidney disease, stage 4 (severe): Secondary | ICD-10-CM | POA: Diagnosis not present

## 2020-10-05 NOTE — Progress Notes (Signed)
Location:  [place] Room Number: 416-S Place of Service:  SNF (31) Provider:  Durenda Age, DNP, FNP-BC  Patient Care Team: Bernerd Limbo, MD as PCP - General (Family Medicine)  Extended Emergency Contact Information Primary Emergency Contact: Pfund,Ronald Address: [address]          [place] Phone: [phone] Mobile Phone: [phone] Relation: Spouse Secondary Emergency Contact: Enrique Sack Mobile Phone: [phone] Relation: Son Interpreter needed? No  Code Status: Full code   Goals of care: Advanced Directive information Advanced Directives 10/05/2020  Does Patient Have a Medical Advance Directive? No  Would patient like information on creating a medical advance directive? No - Patient declined  Pre-existing out of facility DNR order (yellow form or pink MOST form) -     Chief Complaint  Patient presents with   Acute Visit    Short term rehab    HPI:  Pt is a 75 y.o. female seen today for medical management of chronic diseases. She is a short-term care resident of [facility]. BMP drawn today showed:  K 5.3, creatinine 1.97 and GFR 24.49. She is currently taking Lasix 40 mg BID for CHF and lower extremity edema. Bilateral lower extremity has unna wraps due to edema and drainage. No SOB.     Past Medical History:  Diagnosis Date   Allergy    Anemia    Arthritis    Cataract    biltaerally    CKD (chronic kidney disease) stage 3, GFR 30-59 ml/min ([place]) 08/29/2011   per patient, kidneys monitored by Dr Posey Pronto endocrinologist ,lov was  05-18-2018, "kidney fx stable"   Colon polyp    Complication of anesthesia    Difficult intubation    small mouth opening   Diverticulosis    Gout    HTN (hypertension) 08/29/2011   Hyperlipemia    Psoriasis    Type II or unspecified type diabetes mellitus without mention of complication, not stated as uncontrolled  08/29/2011   Past Surgical History:  Procedure Laterality Date   APPLICATION OF WOUND VAC N/A 08/30/2020   Procedure: APPLICATION OF WOUND VAC;  Surgeon: Consuella Lose, MD;  Location: [place];  Service: Neurosurgery;  Laterality: N/A;   BREAST LUMPECTOMY WITH NEEDLE LOCALIZATION Right 05/07/2012   Procedure: BREAST LUMPECTOMY WITH NEEDLE LOCALIZATION;  Surgeon: Merrie Roof, MD;  Location: [place];  Service: General;  Laterality: Right;   CHOLECYSTECTOMY     COLONOSCOPY  2009   [facility]    COLONOSCOPY WITH PROPOFOL N/A 05/20/2018   Procedure: COLONOSCOPY WITH PROPOFOL;  Surgeon: Milus Banister, MD;  Location: [facility];  Service: Endoscopy;  Laterality: N/A;   HERNIA REPAIR     HIP SURGERY     LAMINECTOMY WITH POSTERIOR LATERAL ARTHRODESIS LEVEL 4 N/A 08/01/2020   Procedure: LAMINECTOMY WITH POSTERIOR LATERAL ARTHRODESIS Thoracic nine- Thoracic eleven ;  Surgeon: Consuella Lose, MD;  Location: [place];  Service: Neurosurgery;  Laterality: N/A;   POLYPECTOMY  05/20/2018   Procedure: POLYPECTOMY;  Surgeon: Milus Banister, MD;  [facility];  Service: Endoscopy;;   RHINOPLASTY     SALPINGOOPHORECTOMY     TOTAL HIP ARTHROPLASTY Right 2004   WOUND EXPLORATION N/A 08/30/2020   Procedure: SUPERFICIALWOUND EXPLORATION, APPLY WOUND VAC;  Surgeon: Consuella Lose, MD;  Location: [place];  Service: Neurosurgery;  Laterality: N/A;    Allergies  Allergen Reactions   Codeine Itching   Iodinated Diagnostic Agents  Other reaction(s): Unknown   Pentazocine Other (See Comments)    Hallucinations   Statins     Outpatient Encounter Medications as of 10/05/2020  Medication Sig   allopurinol (ZYLOPRIM) 300 MG tablet Take 150 mg by mouth in the morning. (0900)   atenolol (TENORMIN) 100 MG tablet Take 100 mg by mouth in the morning. (0900)   b complex vitamins tablet Take 1 tablet by mouth in the morning. (0900)   bisacodyl (DULCOLAX) 10 MG suppository Place 10 mg rectally as needed  for moderate constipation.   cloNIDine (CATAPRES) 0.2 MG tablet Take 1 tablet (0.2 mg total) by mouth 2 (two) times daily.   feeding supplement (ENSURE ENLIVE / ENSURE PLUS) LIQD Take 237 mLs by mouth 3 (three) times daily between meals.   ferrous sulfate 325 (65 FE) MG tablet Take 325 mg by mouth daily with breakfast. (0900)   furosemide (LASIX) 40 MG tablet Take 40 mg by mouth in the morning. (0900)   HYDROcodone-acetaminophen (NORCO/VICODIN) 5-325 MG tablet Take 2 tablets by mouth every 6 (six) hours as needed for moderate pain or severe pain.   loratadine (CLARITIN) 10 MG tablet Take 10 mg by mouth in the morning. (0900)   magnesium hydroxide (MILK OF MAGNESIA) 400 MG/5ML suspension Take 30 mLs by mouth daily as needed for mild constipation.   Menthol, Topical Analgesic, (BIOFREEZE) 4 % GEL Apply 1 application topically as directed. To right foot three times daily for pain   mometasone (ELOCON) 0.1 % cream Apply 1 application topically daily as needed (psoriasis).   Multiple Vitamin (MULTIVITAMIN WITH MINERALS) TABS tablet Take 1 tablet by mouth daily.   NON FORMULARY Diet: Heart Healthy, NAS, Regular textute Thin liquids   Omega-3 1000 MG CAPS Take 1,000 mg by mouth 2 (two) times daily. (0900 & 2100)   ondansetron (ZOFRAN) 4 MG tablet Take 4 mg by mouth every 8 (eight) hours as needed for nausea or vomiting.   polyethylene glycol (MIRALAX / GLYCOLAX) 17 g packet Take 17 g by mouth daily.   sitaGLIPtin (JANUVIA) 50 MG tablet Take 50 mg by mouth in the morning. (0900)   [DISCONTINUED] benzonatate (TESSALON) 100 MG capsule Take 100 mg by mouth every 8 (eight) hours as needed for cough.   No facility-administered encounter medications on file as of 10/05/2020.    Review of Systems  GENERAL: No change in appetite, no fatigue, no weight changes, no fever or chills  MOUTH and THROAT: Denies oral discomfort, gingival pain or bleeding RESPIRATORY: no cough, SOB, DOE, wheezing,  hemoptysis CARDIAC: No chest pain or palpitations GI: No abdominal pain, diarrhea, constipation, heart burn, nausea or vomiting GU: Denies dysuria, frequency, hematuria or discharge NEUROLOGICAL: Denies dizziness, syncope, numbness, or headache PSYCHIATRIC: Denies feelings of depression or anxiety. No report of hallucinations, insomnia, paranoia, or agitation   Immunization History  Administered Date(s) Administered   Fluad Quad(high Dose 65+) 12/03/2018   Influenza Split 12/22/2008, 11/30/2009, 12/13/2010, 12/25/2011   Influenza, High Dose Seasonal PF 01/11/2015, 11/23/2017, 12/03/2018, 12/13/2019   Influenza, Seasonal, Injecte, Preservative Fre 12/16/2013   Influenza,inj,Quad PF,6+ Mos 12/11/2015, 11/14/2016   Influenza,inj,Quad PF,6-35 Mos 12/11/2015, 11/14/2016   Influenza,inj,quad, With Preservative 12/16/2013   PFIZER(Purple Top)SARS-COV-2 Vaccination 05/01/2019, 05/26/2019, 11/22/2019, 07/17/2020   Pneumococcal Conjugate-13 01/16/2012   Pneumococcal Polysaccharide-23 03/24/2005, 08/08/2014   Tdap 09/13/2010   Pertinent  Health Maintenance Due  Topic Date Due   FOOT EXAM  Never done   OPHTHALMOLOGY EXAM  Never done   MAMMOGRAM  Never done  DEXA SCAN  Never done   INFLUENZA VACCINE  10/22/2020   HEMOGLOBIN A1C  02/01/2021   COLONOSCOPY (Pts 45-41yrs Insurance coverage will need to be confirmed)  05/20/2028   PNA vac Low Risk Adult  Completed   No flowsheet data found.   Vitals:   10/05/20 0933  BP: 112/74  Pulse: 73  Resp: 18  Temp: (!) 96.4 F (35.8 C)  Weight: 158 lb (71.7 kg)  Height: 5\' 3"  (1.6 m)   Body mass index is 27.99 kg/m.  Physical Exam  GENERAL APPEARANCE: Well nourished. In no acute distress. Normal body habitus SKIN:  midback surgical wound attached to wound vac MOUTH and THROAT: Lips are without lesions. Oral mucosa is moist and without lesions.  RESPIRATORY: Breathing is even & unlabored, BS CTAB CARDIAC: RRR, no murmur,no extra heart  sounds, BLE 2+ edema GI: Abdomen soft, normal BS, no masses, no tenderness NEUROLOGICAL: There is no tremor. Speech is clear. Alert and oriented X 3. PSYCHIATRIC:  Affect and behavior are appropriate  Labs reviewed: Recent Labs    08/01/20 0559 08/01/20 1226 08/03/20 0804 08/04/20 0425 08/30/20 1339 08/30/20 2057 08/31/20 0507 09/01/20 1514 09/29/20 0000  NA 137   < > 139   < > 131*  --  133* 136 134*  K 5.6*   < > 4.2   < > 5.1  --  6.2* 5.2* 4.9  CL 107   < > 111   < > 101  --  102 104 96*  CO2 22   < > 20*   < > 20*  --  22 24 24*  GLUCOSE 194*   < > 76   < > 93  --  132* 49*  --   BUN 68*   < > 72*   < > 52*  --  47* 43* 56*  CREATININE 2.68*   < > 2.04*   < > 3.30*   < > 3.13* 2.67* 1.9*  CALCIUM 8.7*   < > 7.7*   < > 8.6*  --  8.9 8.8* 9.5  MG 1.9  --  2.0  --   --   --   --   --   --   PHOS 6.4*  --   --   --   --   --   --   --   --    < > = values in this interval not displayed.   Recent Labs    08/01/20 0559 08/03/20 1159  AST 21  --   ALT 15  --   ALKPHOS 86  --   BILITOT 0.7  --   PROT 6.4*  --   ALBUMIN 2.7* 2.4*   Recent Labs    08/06/20 0652 08/07/20 0208 08/08/20 0056 08/30/20 1339 08/30/20 2057 09/11/20 0000  WBC 9.8 10.5 12.4* 9.8 8.7 6.6  NEUTROABS 7.2 7.2 9.3*  --   --   --   HGB 8.8* 8.5* 8.5* 9.1* 9.3* 8.6*  HCT 27.3* 26.6* 26.9* 30.3* 30.6* 28*  MCV 87.8 87.2 87.9 88.1 86.7  --   PLT 204 204 237 265 267 323   Lab Results  Component Value Date   TSH 1.188 08/01/2020   Lab Results  Component Value Date   HGBA1C 5.4 08/01/2020   Lab Results  Component Value Date   CHOL 116 08/31/2011   HDL 21 (L) 08/31/2011   LDLCALC 35 08/31/2011   TRIG 221 (H) 08/06/2020   CHOLHDL 5.5  08/31/2011    Significant Diagnostic Results in last 30 days:  No results found.  Assessment/Plan  1. Hyperkalemia -   K 5.3, elevated -  give Kayexalate 15 gm/60 ml PO X 1 -  BMP on 10/08/20  2. Bilateral lower extremity edema -  continue Lasix 40  mg BID -  continue unna wraps  3. Chronic kidney disease (CKD), stage IV (severe) (HCC) -  creatinine 1.97, GFR 24.49 -  will monitor  4. Chronic diastolic (congestive) heart failure (HCC) -  no SOB, continue Lasix 40 mg BID      Family/ staff Communication:  Discussed plan of care with resident and charge nurse.  Labs/tests ordered:   BMP on 10/08/20  Goals of care:   None   Durenda Age, DNP, MSN, FNP-BC [facility] [phone] (Monday-Friday 8:00 a.m. - 5:00 p.m.) [phone] (after hours)

## 2020-10-12 ENCOUNTER — Encounter: Payer: Self-pay | Admitting: Adult Health

## 2020-10-12 ENCOUNTER — Non-Acute Institutional Stay (SKILLED_NURSING_FACILITY): Payer: Medicare PPO | Admitting: Adult Health

## 2020-10-12 DIAGNOSIS — T8130XA Disruption of wound, unspecified, initial encounter: Secondary | ICD-10-CM

## 2020-10-12 DIAGNOSIS — N184 Chronic kidney disease, stage 4 (severe): Secondary | ICD-10-CM

## 2020-10-12 DIAGNOSIS — R0602 Shortness of breath: Secondary | ICD-10-CM

## 2020-10-12 DIAGNOSIS — E1122 Type 2 diabetes mellitus with diabetic chronic kidney disease: Secondary | ICD-10-CM

## 2020-10-12 DIAGNOSIS — I10 Essential (primary) hypertension: Secondary | ICD-10-CM

## 2020-10-12 DIAGNOSIS — I5032 Chronic diastolic (congestive) heart failure: Secondary | ICD-10-CM | POA: Diagnosis not present

## 2020-10-12 NOTE — Progress Notes (Signed)
Location:  [place] Room Number: 761 A Place of Service:  SNF (31) Provider:  Durenda Age, DNP, FNP-BC  Patient Care Team: Bernerd Limbo, MD as PCP - General (Family Medicine)  Extended Emergency Contact Information Primary Emergency Contact: Pitner,Ronald Address: [address]          [place] Phone: [phone] Mobile Phone: [phone] Relation: Spouse Secondary Emergency Contact: Enrique Sack Mobile Phone: [phone] Relation: Son Interpreter needed? No  Code Status:  FULL CODE  Goals of care: Advanced Directive information Advanced Directives 10/12/2020  Does Patient Have a Medical Advance Directive? No  Would patient like information on creating a medical advance directive? -  Pre-existing out of facility DNR order (yellow form or pink MOST form) -     Chief Complaint  Patient presents with   Acute Visit    Short term rehab    HPI:  Pt is a 75 y.o. female seen today for medical management of chronic diseases. She is currently having a short-term rehabilitation at [place]. She had wound dehiscence for which she had wound debridement and application of wound VAC on 08/30/20 after previous percutaneous stabilization of T10 fracture. Her wound vac has now been discontinued. She was seen in her room. She complained of having shortness of breath. No reported hypoxia.  CBGs ranging from 99-188.  She takes Januvia 50 mg daily for diabetes mellitus.  SBPs ranging from 131-149, with outliers 185 and 187.  She states clonidine 0.2 mg 1 tablet twice a day and atenolol 100 mg daily for hypertension.   Past Medical History:  Diagnosis Date   Allergy    Anemia    Arthritis    Cataract    biltaerally    CKD (chronic kidney disease) stage 3, GFR 30-59 ml/min ([place]) 08/29/2011   per patient, kidneys monitored by Dr Posey Pronto endocrinologist ,lov was  05-18-2018, "kidney fx  stable"   Colon polyp    Complication of anesthesia    Difficult intubation    small mouth opening   Diverticulosis    Gout    HTN (hypertension) 08/29/2011   Hyperlipemia    Psoriasis    Type II or unspecified type diabetes mellitus without mention of complication, not stated as uncontrolled 08/29/2011   Past Surgical History:  Procedure Laterality Date   APPLICATION OF WOUND VAC N/A 08/30/2020   Procedure: APPLICATION OF WOUND VAC;  Surgeon: Consuella Lose, MD;  Location: [place];  Service: Neurosurgery;  Laterality: N/A;   BREAST LUMPECTOMY WITH NEEDLE LOCALIZATION Right 05/07/2012   Procedure: BREAST LUMPECTOMY WITH NEEDLE LOCALIZATION;  Surgeon: Merrie Roof, MD;  Location: [place];  Service: General;  Laterality: Right;   CHOLECYSTECTOMY     COLONOSCOPY  2009   [facility]    COLONOSCOPY WITH PROPOFOL N/A 05/20/2018   Procedure: COLONOSCOPY WITH PROPOFOL;  Surgeon: Milus Banister, MD;  [facility];  Service: Endoscopy;  Laterality: N/A;   HERNIA REPAIR     HIP SURGERY     LAMINECTOMY WITH POSTERIOR LATERAL ARTHRODESIS LEVEL 4 N/A 08/01/2020   Procedure: LAMINECTOMY WITH POSTERIOR LATERAL ARTHRODESIS Thoracic nine- Thoracic eleven ;  Surgeon: Consuella Lose, MD;  Location: [place];  Service: Neurosurgery;  Laterality: N/A;   POLYPECTOMY  05/20/2018   Procedure: POLYPECTOMY;  Surgeon: Milus Banister, MD;  [facility];  Service: Endoscopy;;   RHINOPLASTY     SALPINGOOPHORECTOMY     TOTAL HIP  ARTHROPLASTY Right 2004   WOUND EXPLORATION N/A 08/30/2020   Procedure: SUPERFICIALWOUND EXPLORATION, APPLY WOUND VAC;  Surgeon: Consuella Lose, MD;  Location: [place];  Service: Neurosurgery;  Laterality: N/A;    Allergies  Allergen Reactions   Codeine Itching   Iodinated Diagnostic Agents     Other reaction(s): Unknown   Pentazocine Other (See Comments)    Hallucinations   Statins     Outpatient Encounter Medications as of 10/12/2020  Medication Sig    allopurinol (ZYLOPRIM) 100 MG tablet Take 50 mg by mouth every other day. TAKE 1/2 TABLET (50MG ) BY MOUTH EVERY OTHER DAY FOR GOUT   atenolol (TENORMIN) 100 MG tablet Take 100 mg by mouth in the morning. (0900)   b complex vitamins tablet Take 1 tablet by mouth in the morning. (0900)   bisacodyl (DULCOLAX) 10 MG suppository Place 10 mg rectally as needed for moderate constipation.   cloNIDine (CATAPRES) 0.2 MG tablet Take 1 tablet (0.2 mg total) by mouth 2 (two) times daily.   feeding supplement (ENSURE ENLIVE / ENSURE PLUS) LIQD Take 237 mLs by mouth 3 (three) times daily between meals.   ferrous sulfate 325 (65 FE) MG tablet Take 325 mg by mouth daily with breakfast. (0900)   furosemide (LASIX) 40 MG tablet Take 40 mg by mouth in the morning. (0900)   HYDROcodone-acetaminophen (NORCO/VICODIN) 5-325 MG tablet Take 2 tablets by mouth every 6 (six) hours as needed for moderate pain or severe pain.   loratadine (CLARITIN) 10 MG tablet Take 10 mg by mouth in the morning. (0900)   magnesium hydroxide (MILK OF MAGNESIA) 400 MG/5ML suspension Take 30 mLs by mouth daily as needed for mild constipation.   Menthol, Topical Analgesic, (BIOFREEZE) 4 % GEL Apply 1 application topically as directed. To right foot three times daily for pain   mometasone (ELOCON) 0.1 % cream Apply 1 application topically daily as needed (psoriasis).   Multiple Vitamin (MULTIVITAMIN WITH MINERALS) TABS tablet Take 1 tablet by mouth daily.   Omega-3 1000 MG CAPS Take 1,000 mg by mouth 2 (two) times daily. (0900 & 2100)   ondansetron (ZOFRAN) 4 MG tablet Take 4 mg by mouth every 8 (eight) hours as needed for nausea or vomiting.   polyethylene glycol (MIRALAX / GLYCOLAX) 17 g packet Take 17 g by mouth daily.   sitaGLIPtin (JANUVIA) 50 MG tablet Take 50 mg by mouth in the morning. (0900)   Sodium Phosphates (RA SALINE ENEMA RE) Place 1 Dose rectally as needed. If not relieved by Biscodyl suppository, givedisposable Saline Enema  rectally X 1 dose/24 hrs as needed   NON FORMULARY Diet: Heart Healthy, NAS, Regular textute Thin liquids   No facility-administered encounter medications on file as of 10/12/2020.    Review of Systems  GENERAL: No change in appetite, no fatigue, no weight changes, no fever or chills MOUTH and THROAT: Denies oral discomfort, gingival pain or bleeding RESPIRATORY: +cough, and SOB CARDIAC: No chest pain or palpitations GI: No abdominal pain, diarrhea, constipation, heart burn, nausea or vomiting GU: Denies dysuria, frequency, hematuria, incontinence, or discharge NEUROLOGICAL: Denies dizziness, syncope, numbness, or headache PSYCHIATRIC: Denies feelings of depression or anxiety. No report of hallucinations, insomnia, paranoia, or agitation   Immunization History  Administered Date(s) Administered   Fluad Quad(high Dose 65+) 12/03/2018   Influenza Split 12/22/2008, 11/30/2009, 12/13/2010, 12/25/2011   Influenza, High Dose Seasonal PF 01/11/2015, 11/23/2017, 12/03/2018, 12/13/2019   Influenza, Seasonal, Injecte, Preservative Fre 12/16/2013   Influenza,inj,Quad PF,6+ Mos 12/11/2015, 11/14/2016  Influenza,inj,Quad PF,6-35 Mos 12/11/2015, 11/14/2016   Influenza,inj,quad, With Preservative 12/16/2013   PFIZER(Purple Top)SARS-COV-2 Vaccination 05/01/2019, 05/26/2019, 11/22/2019, 07/17/2020   Pneumococcal Conjugate-13 01/16/2012   Pneumococcal Polysaccharide-23 03/24/2005, 08/08/2014   Tdap 09/13/2010   Pertinent  Health Maintenance Due  Topic Date Due   FOOT EXAM  Never done   OPHTHALMOLOGY EXAM  Never done   MAMMOGRAM  Never done   DEXA SCAN  Never done   INFLUENZA VACCINE  10/22/2020   HEMOGLOBIN A1C  02/01/2021   COLONOSCOPY (Pts 45-4yrs Insurance coverage will need to be confirmed)  05/20/2028   PNA vac Low Risk Adult  Completed   No flowsheet data found.   Vitals:   10/12/20 1004  BP: (!) 149/77  Pulse: 71  Resp: 18  Temp: (!) 97.3 F (36.3 C)  Height: 5\' 3"  (1.6 m)    Body mass index is 27.99 kg/m.  Physical Exam  GENERAL APPEARANCE: Well nourished. In no acute distress. Obese. SKIN:  Surgical wound on mid back is covered with dressing MOUTH and THROAT: Lips are without lesions. Oral mucosa is moist and without lesions. RESPIRATORY: Breathing is even & unlabored, BS CTAB CARDIAC: RRR, no murmur,no extra heart sounds, BLE 2+ edema GI: Abdomen soft, normal BS, no masses, no tenderness NEUROLOGICAL: There is no tremor. Speech is clear. Alert and oriented X 3. PSYCHIATRIC:  Affect and behavior are appropriate  Labs reviewed: Recent Labs    08/01/20 0559 08/01/20 1226 08/03/20 0804 08/04/20 0425 08/30/20 1339 08/30/20 2057 08/31/20 0507 09/01/20 1514 09/29/20 0000  NA 137   < > 139   < > 131*  --  133* 136 134*  K 5.6*   < > 4.2   < > 5.1  --  6.2* 5.2* 4.9  CL 107   < > 111   < > 101  --  102 104 96*  CO2 22   < > 20*   < > 20*  --  22 24 24*  GLUCOSE 194*   < > 76   < > 93  --  132* 49*  --   BUN 68*   < > 72*   < > 52*  --  47* 43* 56*  CREATININE 2.68*   < > 2.04*   < > 3.30*   < > 3.13* 2.67* 1.9*  CALCIUM 8.7*   < > 7.7*   < > 8.6*  --  8.9 8.8* 9.5  MG 1.9  --  2.0  --   --   --   --   --   --   PHOS 6.4*  --   --   --   --   --   --   --   --    < > = values in this interval not displayed.   Recent Labs    08/01/20 0559 08/03/20 1159  AST 21  --   ALT 15  --   ALKPHOS 86  --   BILITOT 0.7  --   PROT 6.4*  --   ALBUMIN 2.7* 2.4*   Recent Labs    08/06/20 0652 08/07/20 0208 08/08/20 0056 08/30/20 1339 08/30/20 2057 09/11/20 0000  WBC 9.8 10.5 12.4* 9.8 8.7 6.6  NEUTROABS 7.2 7.2 9.3*  --   --   --   HGB 8.8* 8.5* 8.5* 9.1* 9.3* 8.6*  HCT 27.3* 26.6* 26.9* 30.3* 30.6* 28*  MCV 87.8 87.2 87.9 88.1 86.7  --   PLT 204 204 237 265  267 323   Lab Results  Component Value Date   TSH 1.188 08/01/2020   Lab Results  Component Value Date   HGBA1C 5.4 08/01/2020   Lab Results  Component Value Date   CHOL 116  08/31/2011   HDL 21 (L) 08/31/2011   LDLCALC 35 08/31/2011   TRIG 221 (H) 08/06/2020   CHOLHDL 5.5 08/31/2011    Significant Diagnostic Results in last 30 days:  No results found.  Assessment/Plan  1. Shortness of breath -  will get chest x-ray done -  has cough, will start guaifenesin 100 mg /32ml give 10 mL = 200 mg at 6 AM, 2 PM and 10 PM x1 week  2. Chronic diastolic (congestive) heart failure (HCC) -   Continue Lasix 40 mg daily  3. Primary hypertension -   Continue clonidine 0.2 mg twice a day and atenolol 100 mg daily  4. Type 2 diabetes mellitus with stage 4 chronic kidney disease, without long-term current use of insulin (HCC) Lab Results  Component Value Date   HGBA1C 5.4 08/01/2020   -   Continue Januvia 50 mg daily  5. Wound dehiscence -   S/P debridement on 08/30/20, wound VAC was discontinued -    continue wound treatment daily    Family/ staff Communication:   Discussed plan of care with resident and charge nurse.  Labs/tests ordered: Chest x-ray  Goals of care:   Short-term care   Durenda Age, DNP, MSN, FNP-BC [facility] [phone] (Monday-Friday 8:00 a.m. - 5:00 p.m.) [phone] (after hours)

## 2020-10-14 ENCOUNTER — Telehealth: Payer: Self-pay | Admitting: Adult Health

## 2020-10-14 NOTE — Telephone Encounter (Signed)
Nurse Iris from [facility] called to report CXR results. Resident had some sob and cough and an xray was obtained on 7/22. They are calling the results to me on 7/24.   Results show large right pleural effusion and small left pleural effusion. The nurse reports now that she is NOT sob and is feeling better. She is able to ambulate to the BR without distress. She has a hx of CHF. Echo reviewed with EF 65% and no atrial enlargement in may of 2022. She is s/p T10 fx and has a non healing wound with wound vac.  Nurse reports her sats are 97-98%.  Albumin is low when last checked in May as well. Since she is not symptomatic per the nurse will forward info to [facility] NP and Dr. Linna Darner to review the xray in the am and check on the resident

## 2020-10-17 ENCOUNTER — Encounter: Payer: Self-pay | Admitting: Adult Health

## 2020-10-17 ENCOUNTER — Non-Acute Institutional Stay (SKILLED_NURSING_FACILITY): Payer: Medicare PPO | Admitting: Adult Health

## 2020-10-17 DIAGNOSIS — R7989 Other specified abnormal findings of blood chemistry: Secondary | ICD-10-CM

## 2020-10-17 DIAGNOSIS — I5032 Chronic diastolic (congestive) heart failure: Secondary | ICD-10-CM

## 2020-10-17 DIAGNOSIS — N184 Chronic kidney disease, stage 4 (severe): Secondary | ICD-10-CM | POA: Diagnosis not present

## 2020-10-17 DIAGNOSIS — R059 Cough, unspecified: Secondary | ICD-10-CM

## 2020-10-17 LAB — CBC AND DIFFERENTIAL
HCT: 32 — AB (ref 36–46)
Hemoglobin: 10.5 — AB (ref 12.0–16.0)
Platelets: 367 (ref 150–399)
WBC: 9

## 2020-10-17 LAB — BASIC METABOLIC PANEL
BUN: 33 — AB (ref 4–21)
CO2: 23 — AB (ref 13–22)
Chloride: 97 — AB (ref 99–108)
Creatinine: 1.7 — AB (ref <=1.1)
Glucose: 147
Potassium: 5 (ref 3.4–5.3)
Sodium: 138 (ref 137–147)

## 2020-10-17 LAB — COMPREHENSIVE METABOLIC PANEL
Calcium: 9.8 (ref 8.7–10.7)
GFR calc Af Amer: 33.44
GFR calc non Af Amer: 28.85

## 2020-10-17 LAB — CBC: RBC: 4.17 (ref 3.87–5.11)

## 2020-10-17 NOTE — Progress Notes (Signed)
Location:  [facility] Room Number: 782 A Place of Service:  SNF (31) Provider:  Durenda Age, DNP, FNP-BC  Patient Care Team: Bernerd Limbo, MD as PCP - General (Family Medicine)  Extended Emergency Contact Information Primary Emergency Contact: Sahlin,Ronald Address: [address]          [place] Phone: [phone] Mobile Phone: [phone] Relation: Spouse Secondary Emergency Contact: Enrique Sack Mobile Phone: [phone] Relation: Son Interpreter needed? No  Code Status:    Goals of care: Advanced Directive information Advanced Directives 10/12/2020  Does Patient Have a Medical Advance Directive? No  Would patient like information on creating a medical advance directive? -  Pre-existing out of facility DNR order (yellow form or pink MOST form) -     Chief Complaint  Patient presents with   Acute Visit    Shortness of breath    HPI:  Pt is a 75 y.o. female who had shortness of breath with no hypoxia. Chest x-ray done on 10/12/20 showed large right and small left pleural effusions. She was seen in her room today. She stated that she feels a lot better and no more SOB. Chest x-ray done on 08/04/20 showed vascular engorgement and tissue density on right lower lobe versus pleural effusion, evidence of CHF with airspace opacity on Right lower lobe. Chest x-ray done on 10/12/20 does not show vascular engorgement.  D-dimer was obtained and was significantly elevated at 16.4, BNP 173, wbc 9.0, creatinine 1.72, GFR 28.85. She is currently taking Lasix 40 mg daily for chronic diastolic CHF. She stated that she occasionally has productive cough with whitish/greenish phlegm.  Past Medical History:  Diagnosis Date   Allergy    Anemia    Arthritis    Cataract    biltaerally    CKD (chronic kidney disease) stage 3, GFR 30-59 ml/min ([place]) 08/29/2011   per patient, kidneys monitored by Dr Posey Pronto  endocrinologist ,lov was  05-18-2018, "kidney fx stable"   Colon polyp    Complication of anesthesia    Difficult intubation    small mouth opening   Diverticulosis    Gout    HTN (hypertension) 08/29/2011   Hyperlipemia    Psoriasis    Type II or unspecified type diabetes mellitus without mention of complication, not stated as uncontrolled 08/29/2011   Past Surgical History:  Procedure Laterality Date   APPLICATION OF WOUND VAC N/A 08/30/2020   Procedure: APPLICATION OF WOUND VAC;  Surgeon: Consuella Lose, MD;  Location: [facility];  Service: Neurosurgery;  Laterality: N/A;   BREAST LUMPECTOMY WITH NEEDLE LOCALIZATION Right 05/07/2012   Procedure: BREAST LUMPECTOMY WITH NEEDLE LOCALIZATION;  Surgeon: Merrie Roof, MD;  Location: [facility];  Service: General;  Laterality: Right;   CHOLECYSTECTOMY     COLONOSCOPY  2009   [facility]    COLONOSCOPY WITH PROPOFOL N/A 05/20/2018   Procedure: COLONOSCOPY WITH PROPOFOL;  Surgeon: Milus Banister, MD;  Location: [facility];  Service: Endoscopy;  Laterality: N/A;   HERNIA REPAIR     HIP SURGERY     LAMINECTOMY WITH POSTERIOR LATERAL ARTHRODESIS LEVEL 4 N/A 08/01/2020   Procedure: LAMINECTOMY WITH POSTERIOR LATERAL ARTHRODESIS Thoracic nine- Thoracic eleven ;  Surgeon: Consuella Lose, MD;  Location: [facility];  Service: Neurosurgery;  Laterality: N/A;   POLYPECTOMY  05/20/2018   Procedure: POLYPECTOMY;  Surgeon: Milus Banister, MD;  Location: [facility];  Service: Endoscopy;;   RHINOPLASTY  SALPINGOOPHORECTOMY     TOTAL HIP ARTHROPLASTY Right 2004   WOUND EXPLORATION N/A 08/30/2020   Procedure: SUPERFICIALWOUND EXPLORATION, APPLY WOUND VAC;  Surgeon: Consuella Lose, MD;  Location: [facility];  Service: Neurosurgery;  Laterality: N/A;    Allergies  Allergen Reactions   Codeine Itching   Iodinated Diagnostic Agents     Other reaction(s): Unknown   Pentazocine Other (See Comments)    Hallucinations   Statins     Outpatient Encounter  Medications as of 10/17/2020  Medication Sig   allopurinol (ZYLOPRIM) 100 MG tablet Take 50 mg by mouth every other day. TAKE 1/2 TABLET (50MG ) BY MOUTH EVERY OTHER DAY FOR GOUT   atenolol (TENORMIN) 100 MG tablet Take 100 mg by mouth in the morning. (0900)   b complex vitamins tablet Take 1 tablet by mouth in the morning. (0900)   bisacodyl (DULCOLAX) 10 MG suppository Place 10 mg rectally as needed for moderate constipation.   cloNIDine (CATAPRES) 0.2 MG tablet Take 1 tablet (0.2 mg total) by mouth 2 (two) times daily.   feeding supplement (ENSURE ENLIVE / ENSURE PLUS) LIQD Take 237 mLs by mouth 3 (three) times daily between meals.   ferrous sulfate 325 (65 FE) MG tablet Take 325 mg by mouth daily with breakfast. (0900)   furosemide (LASIX) 40 MG tablet Take 40 mg by mouth in the morning. (0900)   HYDROcodone-acetaminophen (NORCO/VICODIN) 5-325 MG tablet Take 2 tablets by mouth every 6 (six) hours as needed for moderate pain or severe pain.   loratadine (CLARITIN) 10 MG tablet Take 10 mg by mouth in the morning. (0900)   magnesium hydroxide (MILK OF MAGNESIA) 400 MG/5ML suspension Take 30 mLs by mouth daily as needed for mild constipation.   Menthol, Topical Analgesic, (BIOFREEZE) 4 % GEL Apply 1 application topically as directed. To right foot three times daily for pain   mometasone (ELOCON) 0.1 % cream Apply 1 application topically daily as needed (psoriasis).   Multiple Vitamin (MULTIVITAMIN WITH MINERALS) TABS tablet Take 1 tablet by mouth daily.   NON FORMULARY Diet: Heart Healthy, NAS, Regular textute Thin liquids   Omega-3 1000 MG CAPS Take 1,000 mg by mouth 2 (two) times daily. (0900 & 2100)   ondansetron (ZOFRAN) 4 MG tablet Take 4 mg by mouth every 8 (eight) hours as needed for nausea or vomiting.   polyethylene glycol (MIRALAX / GLYCOLAX) 17 g packet Take 17 g by mouth daily.   sitaGLIPtin (JANUVIA) 50 MG tablet Take 50 mg by mouth in the morning. (0900)   Sodium Phosphates (RA  SALINE ENEMA RE) Place 1 Dose rectally as needed. If not relieved by Biscodyl suppository, givedisposable Saline Enema rectally X 1 dose/24 hrs as needed   No facility-administered encounter medications on file as of 10/17/2020.    Review of Systems  GENERAL: No fever or chills  MOUTH and THROAT: Denies oral discomfort, gingival pain or bleeding RESPIRATORY: + cough CARDIAC: No chest pain or palpitations GI: No abdominal pain, diarrhea, constipation, heart burn, nausea or vomiting GU: Denies dysuria, frequency, hematuria or discharge NEUROLOGICAL: Denies dizziness, syncope, numbness, or headache PSYCHIATRIC: Denies feelings of depression or anxiety. No report of hallucinations, insomnia, paranoia, or agitation   Immunization History  Administered Date(s) Administered   Fluad Quad(high Dose 65+) 12/03/2018   Influenza Split 12/22/2008, 11/30/2009, 12/13/2010, 12/25/2011   Influenza, High Dose Seasonal PF 01/11/2015, 11/23/2017, 12/03/2018, 12/13/2019   Influenza, Seasonal, Injecte, Preservative Fre 12/16/2013   Influenza,inj,Quad PF,6+ Mos 12/11/2015, 11/14/2016   Influenza,inj,Quad  PF,6-35 Mos 12/11/2015, 11/14/2016   Influenza,inj,quad, With Preservative 12/16/2013   PFIZER(Purple Top)SARS-COV-2 Vaccination 05/01/2019, 05/26/2019, 11/22/2019, 07/17/2020   Pneumococcal Conjugate-13 01/16/2012   Pneumococcal Polysaccharide-23 03/24/2005, 08/08/2014   Tdap 09/13/2010   Pertinent  Health Maintenance Due  Topic Date Due   FOOT EXAM  Never done   OPHTHALMOLOGY EXAM  Never done   MAMMOGRAM  Never done   DEXA SCAN  Never done   INFLUENZA VACCINE  10/22/2020   HEMOGLOBIN A1C  02/01/2021   COLONOSCOPY (Pts 45-52yrs Insurance coverage will need to be confirmed)  05/20/2028   PNA vac Low Risk Adult  Completed   No flowsheet data found.   Vitals:   10/17/20 1600  BP: 140/74  Pulse: 78  Resp: 20  Temp: (!) 96.3 F (35.7 C)  Weight: 150 lb 6.4 oz (68.2 kg)  Height: 5\' 3"  (1.6 m)    Body mass index is 26.64 kg/m.  Physical Exam  GENERAL APPEARANCE: Well nourished. In no acute distress. Normal body habitus SKIN:  mid back surgical incision with dressing MOUTH and THROAT: Lips are without lesions. Oral mucosa is moist and without lesions.  RESPIRATORY: Breathing is even & unlabored, BS CTAB CARDIAC: RRR, no murmur,no extra heart sounds, BLE 1-2+ edema GI: Abdomen soft, normal BS, no masses, no tenderness EXTREMITIES:  Able to move X 4 extremities NEUROLOGICAL: There is no tremor. Speech is clear. Alert and oriented X 3. PSYCHIATRIC:  Affect and behavior are appropriate  Labs reviewed: Recent Labs    08/01/20 0559 08/01/20 1226 08/03/20 0804 08/04/20 0425 08/30/20 1339 08/30/20 2057 08/31/20 0507 09/01/20 1514 09/29/20 0000  NA 137   < > 139   < > 131*  --  133* 136 134*  K 5.6*   < > 4.2   < > 5.1  --  6.2* 5.2* 4.9  CL 107   < > 111   < > 101  --  102 104 96*  CO2 22   < > 20*   < > 20*  --  22 24 24*  GLUCOSE 194*   < > 76   < > 93  --  132* 49*  --   BUN 68*   < > 72*   < > 52*  --  47* 43* 56*  CREATININE 2.68*   < > 2.04*   < > 3.30*   < > 3.13* 2.67* 1.9*  CALCIUM 8.7*   < > 7.7*   < > 8.6*  --  8.9 8.8* 9.5  MG 1.9  --  2.0  --   --   --   --   --   --   PHOS 6.4*  --   --   --   --   --   --   --   --    < > = values in this interval not displayed.   Recent Labs    08/01/20 0559 08/03/20 1159  AST 21  --   ALT 15  --   ALKPHOS 86  --   BILITOT 0.7  --   PROT 6.4*  --   ALBUMIN 2.7* 2.4*   Recent Labs    08/06/20 0652 08/07/20 0208 08/08/20 0056 08/30/20 1339 08/30/20 2057 09/11/20 0000  WBC 9.8 10.5 12.4* 9.8 8.7 6.6  NEUTROABS 7.2 7.2 9.3*  --   --   --   HGB 8.8* 8.5* 8.5* 9.1* 9.3* 8.6*  HCT 27.3* 26.6* 26.9* 30.3* 30.6* 28*  MCV  87.8 87.2 87.9 88.1 86.7  --   PLT 204 204 237 265 267 323   Lab Results  Component Value Date   TSH 1.188 08/01/2020   Lab Results  Component Value Date   HGBA1C 5.4 08/01/2020   Lab  Results  Component Value Date   CHOL 116 08/31/2011   HDL 21 (L) 08/31/2011   LDLCALC 35 08/31/2011   TRIG 221 (H) 08/06/2020   CHOLHDL 5.5 08/31/2011    Significant Diagnostic Results in last 30 days:  No results found.  Assessment/Plan  1. Elevated d-dimer -  d-dimer 16.4, no SOB nor chest pain -  will start on Eliquis 2.5 mg BID  2. Chronic diastolic (congestive) heart failure (HCC) -  BNP 173, continue Lasix 40 mg daily  3. Chronic kidney disease (CKD), stage IV (severe) (HCC) -   creatinine 1.72 and GFR 28.85, improved  4. Cough -  will start Guaifenesin 100 mg/5 ml give 10 ml = 200 mg Q 6am, 2PM and 10 PM X 2 weeks    Family/ staff Communication:   Discussed plan of care with resident and charge nurse.  Labs/tests ordered:  CRP  Goals of care:   Short-term care   Durenda Age, DNP, MSN, FNP-BC [facility] [phone] (Monday-Friday 8:00 a.m. - 5:00 p.m.) [phone] (after hours)

## 2020-10-18 ENCOUNTER — Non-Acute Institutional Stay (SKILLED_NURSING_FACILITY): Payer: Medicare PPO | Admitting: Adult Health

## 2020-10-18 ENCOUNTER — Encounter: Payer: Self-pay | Admitting: Adult Health

## 2020-10-18 DIAGNOSIS — R7989 Other specified abnormal findings of blood chemistry: Secondary | ICD-10-CM | POA: Diagnosis not present

## 2020-10-18 DIAGNOSIS — I5032 Chronic diastolic (congestive) heart failure: Secondary | ICD-10-CM | POA: Diagnosis not present

## 2020-10-18 DIAGNOSIS — M1A0120 Idiopathic chronic gout, left shoulder, without tophus (tophi): Secondary | ICD-10-CM

## 2020-10-18 DIAGNOSIS — J90 Pleural effusion, not elsewhere classified: Secondary | ICD-10-CM

## 2020-10-18 DIAGNOSIS — I10 Essential (primary) hypertension: Secondary | ICD-10-CM

## 2020-10-18 DIAGNOSIS — E1122 Type 2 diabetes mellitus with diabetic chronic kidney disease: Secondary | ICD-10-CM

## 2020-10-18 DIAGNOSIS — N184 Chronic kidney disease, stage 4 (severe): Secondary | ICD-10-CM

## 2020-10-18 DIAGNOSIS — D62 Acute posthemorrhagic anemia: Secondary | ICD-10-CM

## 2020-10-18 DIAGNOSIS — T8130XA Disruption of wound, unspecified, initial encounter: Secondary | ICD-10-CM

## 2020-10-18 DIAGNOSIS — R059 Cough, unspecified: Secondary | ICD-10-CM

## 2020-10-18 MED ORDER — SITAGLIPTIN PHOSPHATE 50 MG PO TABS: 50.0000 mg | ORAL_TABLET | Freq: Every morning | ORAL | 0 refills | Status: DC

## 2020-10-18 MED ORDER — APIXABAN 2.5 MG PO TABS: 2.5000 mg | ORAL_TABLET | Freq: Two times a day (BID) | ORAL | 0 refills | Status: DC

## 2020-10-18 MED ORDER — LORATADINE 10 MG PO TABS: 10.0000 mg | ORAL_TABLET | Freq: Every morning | ORAL | 0 refills | Status: DC

## 2020-10-18 MED ORDER — HYDROCODONE-ACETAMINOPHEN 5-325 MG PO TABS: 2.0000 | ORAL_TABLET | Freq: Four times a day (QID) | ORAL | 0 refills | Status: DC | PRN

## 2020-10-18 MED ORDER — POLYETHYLENE GLYCOL 3350 17 G PO PACK: 17.0000 g | PACK | Freq: Every day | ORAL | 1 refills | Status: DC

## 2020-10-18 MED ORDER — GUAIFENESIN 100 MG/5ML PO LIQD: 200.0000 mg | Freq: Three times a day (TID) | ORAL | 0 refills | Status: AC

## 2020-10-18 MED ORDER — ALLOPURINOL 100 MG PO TABS: 50.0000 mg | ORAL_TABLET | ORAL | 0 refills | Status: DC

## 2020-10-18 MED ORDER — FUROSEMIDE 40 MG PO TABS: 40.0000 mg | ORAL_TABLET | Freq: Every morning | ORAL | 0 refills | Status: DC

## 2020-10-18 MED ORDER — FERROUS SULFATE 325 (65 FE) MG PO TABS: 325.0000 mg | ORAL_TABLET | Freq: Every day | ORAL | 0 refills | Status: DC

## 2020-10-18 MED ORDER — ATENOLOL 100 MG PO TABS: 100.0000 mg | ORAL_TABLET | Freq: Every morning | ORAL | 0 refills | Status: DC

## 2020-10-18 MED ORDER — CLONIDINE HCL 0.2 MG PO TABS: 0.2000 mg | ORAL_TABLET | Freq: Two times a day (BID) | ORAL | 0 refills | Status: DC

## 2020-10-18 NOTE — Progress Notes (Addendum)
Location:  [facility] Room Number: 268-T Place of Service:  SNF (31) Provider:  Durenda Age, DNP, FNP-BC  Patient Care Team: Bernerd Limbo, MD as PCP - General (Family Medicine)  Extended Emergency Contact Information Primary Emergency Contact: Hudgins,Ronald Address: [address]          [place] Phone: [phone] Mobile Phone: [phone] Relation: Spouse Secondary Emergency Contact: Enrique Sack Mobile Phone: [phone] Relation: Son Interpreter needed? No  Code Status: Full code   Goals of care: Advanced Directive information Advanced Directives 10/18/2020  Does Patient Have a Medical Advance Directive? No  Does patient want to make changes to medical advance directive? No - Patient declined  Would patient like information on creating a medical advance directive? -  Pre-existing out of facility DNR order (yellow form or pink MOST form) -     Chief Complaint  Patient presents with   Discharge Note    HPI:  Pt is a 75 y.o. female who is for discharge home on 10/18/20 with Home health PT, OT and Nursing.  She was admitted to [facility] on 09/04/20 post hospital admission 08/30/20 to 09/04/20 due to wound dehiscence for which she had wound debridement and application of wound vac on 08/30/20 after previous percutaneous stabilization of T10 fracture. Of note, she was previously at [facility] having short-term rehabilitation S/P T8-11 stabilization of her unstable T10 fracture on 08/01/20.  Wound vac was discontinued now. Silver collagen applied to wound and covered with foam dressing daily. Unna boots applied to bilateral lower extremities due to edema. She complained of slight SOB with no hypoxia and chest x-ray on 10/12/20 showed large right and small left pleural effusions. Compared to her chest x-ray done on 08/04/20 which showed vascular engorgement and tissue density on  right lower lobe versus pleural effusion, evidence of CHF with airspace opacity on right lower lobe. Ches x-ray done on 10/12/20 does not show vascular engorgement. On 10/17/20, D-dimer was obtained and was significantly elevated at 16.4, BNP 173, wbc 9.0, creatinine 1.72, GFR 28.85. She takes Lasix 40 mg daily for chronic diastolic CHF. COVID-19 swab done on 10/17/20 was negative. She was started on Guaifenesin 200 mg TID X 2 weeks for cough and Eliquis 2.5 mg BID for elevated d-dimer.  Patient was admitted to this facility for short-term rehabilitation after the patient's recent hospitalization.  Patient has completed SNF rehabilitation and therapy has cleared the patient for discharge.   Past Medical History:  Diagnosis Date   Allergy    Anemia    Arthritis    Cataract    biltaerally    CKD (chronic kidney disease) stage 3, GFR 30-59 ml/min ([place]) 08/29/2011   per patient, kidneys monitored by Dr Posey Pronto endocrinologist ,lov was  05-18-2018, "kidney fx stable"   Colon polyp    Complication of anesthesia    Difficult intubation    small mouth opening   Diverticulosis    Gout    HTN (hypertension) 08/29/2011   Hyperlipemia    Psoriasis    Type II or unspecified type diabetes mellitus without mention of complication, not stated as uncontrolled 08/29/2011   Past Surgical History:  Procedure Laterality Date   APPLICATION OF WOUND VAC N/A 08/30/2020   Procedure: APPLICATION OF WOUND VAC;  Surgeon: Consuella Lose, MD;  Location: [place];  Service: Neurosurgery;  Laterality: N/A;   BREAST LUMPECTOMY WITH NEEDLE LOCALIZATION Right 05/07/2012   Procedure: BREAST LUMPECTOMY WITH  NEEDLE LOCALIZATION;  Surgeon: Merrie Roof, MD;  Location: [facility];  Service: General;  Laterality: Right;   CHOLECYSTECTOMY     COLONOSCOPY  2009   [facility]    COLONOSCOPY WITH PROPOFOL N/A 05/20/2018   Procedure: COLONOSCOPY WITH PROPOFOL;  Surgeon: Milus Banister, MD;  Location: [facility];  Service: Endoscopy;   Laterality: N/A;   HERNIA REPAIR     HIP SURGERY     LAMINECTOMY WITH POSTERIOR LATERAL ARTHRODESIS LEVEL 4 N/A 08/01/2020   Procedure: LAMINECTOMY WITH POSTERIOR LATERAL ARTHRODESIS Thoracic nine- Thoracic eleven ;  Surgeon: Consuella Lose, MD;  Location: [facility];  Service: Neurosurgery;  Laterality: N/A;   POLYPECTOMY  05/20/2018   Procedure: POLYPECTOMY;  Surgeon: Milus Banister, MD;  Location: [facility];  Service: Endoscopy;;   RHINOPLASTY     SALPINGOOPHORECTOMY     TOTAL HIP ARTHROPLASTY Right 2004   WOUND EXPLORATION N/A 08/30/2020   Procedure: SUPERFICIALWOUND EXPLORATION, APPLY WOUND VAC;  Surgeon: Consuella Lose, MD;  Location: [facility];  Service: Neurosurgery;  Laterality: N/A;    Allergies  Allergen Reactions   Codeine Itching   Iodinated Diagnostic Agents     Other reaction(s): Unknown   Pentazocine Other (See Comments)    Hallucinations   Statins     Outpatient Encounter Medications as of 10/18/2020  Medication Sig   allopurinol (ZYLOPRIM) 100 MG tablet Take 50 mg by mouth every other day. TAKE 1/2 TABLET (50MG ) BY MOUTH EVERY OTHER DAY FOR GOUT   apixaban (ELIQUIS) 2.5 MG TABS tablet Take 2.5 mg by mouth 2 (two) times daily. Elevated D-Dimer   atenolol (TENORMIN) 100 MG tablet Take 100 mg by mouth in the morning. (0900)   b complex vitamins tablet Take 1 tablet by mouth in the morning. (0900)   bisacodyl (DULCOLAX) 10 MG suppository Place 10 mg rectally as needed for moderate constipation.   cloNIDine (CATAPRES) 0.2 MG tablet Take 1 tablet (0.2 mg total) by mouth 2 (two) times daily.   feeding supplement (ENSURE ENLIVE / ENSURE PLUS) LIQD Take 237 mLs by mouth 3 (three) times daily between meals.   ferrous sulfate 325 (65 FE) MG tablet Take 325 mg by mouth daily with breakfast. (0900)   furosemide (LASIX) 40 MG tablet Take 40 mg by mouth in the morning. (0900)   guaiFENesin (ROBITUSSIN) 100 MG/5ML liquid Give 56ml by mouth for cough x for two weeks    HYDROcodone-acetaminophen (NORCO/VICODIN) 5-325 MG tablet Take 2 tablets by mouth every 6 (six) hours as needed for moderate pain or severe pain.   loratadine (CLARITIN) 10 MG tablet Take 10 mg by mouth in the morning. (0900)   magnesium hydroxide (MILK OF MAGNESIA) 400 MG/5ML suspension Take 30 mLs by mouth daily as needed for mild constipation.   Menthol, Topical Analgesic, (BIOFREEZE) 4 % GEL Apply 1 application topically as directed. To right foot three times daily for pain   mometasone (ELOCON) 0.1 % cream Apply 1 application topically daily as needed (psoriasis).   Multiple Vitamin (MULTIVITAMIN WITH MINERALS) TABS tablet Take 1 tablet by mouth daily.   NON FORMULARY Diet: Heart Healthy, NAS, Regular textute Thin liquids   Omega-3 1000 MG CAPS Take 1,000 mg by mouth 2 (two) times daily. (0900 & 2100)   ondansetron (ZOFRAN) 4 MG tablet Take 4 mg by mouth every 8 (eight) hours as needed for nausea or vomiting.   polyethylene glycol (MIRALAX / GLYCOLAX) 17 g packet Take 17 g by mouth daily.   sitaGLIPtin (  JANUVIA) 50 MG tablet Take 50 mg by mouth in the morning. (0900)   Sodium Phosphates (RA SALINE ENEMA RE) Place 1 Dose rectally as needed. If not relieved by Biscodyl suppository, givedisposable Saline Enema rectally X 1 dose/24 hrs as needed   No facility-administered encounter medications on file as of 10/18/2020.    Review of Systems  GENERAL: No change in appetite, no fatigue, no weight changes, no fever or chills  MOUTH and THROAT: Denies oral discomfort, gingival pain or bleeding, RESPIRATORY: no wheezing, hemoptysis, +cough CARDIAC: No chest pain or palpitations GI: No abdominal pain, diarrhea, constipation, heart burn, nausea or vomiting GU: Denies dysuria, frequency, hematuria, incontinence, or discharge NEUROLOGICAL: Denies dizziness, syncope, numbness, or headache PSYCHIATRIC: Denies feelings of depression or anxiety. No report of hallucinations, insomnia, paranoia, or  agitation   Immunization History  Administered Date(s) Administered   Fluad Quad(high Dose 65+) 12/03/2018   Influenza Split 12/22/2008, 11/30/2009, 12/13/2010, 12/25/2011   Influenza, High Dose Seasonal PF 01/11/2015, 11/23/2017, 12/03/2018, 12/13/2019   Influenza, Seasonal, Injecte, Preservative Fre 12/16/2013   Influenza,inj,Quad PF,6+ Mos 12/11/2015, 11/14/2016   Influenza,inj,Quad PF,6-35 Mos 12/11/2015, 11/14/2016   Influenza,inj,quad, With Preservative 12/16/2013   PFIZER(Purple Top)SARS-COV-2 Vaccination 05/01/2019, 05/26/2019, 11/22/2019, 07/17/2020   Pneumococcal Conjugate-13 01/16/2012   Pneumococcal Polysaccharide-23 03/24/2005, 08/08/2014   Tdap 09/13/2010   Pertinent  Health Maintenance Due  Topic Date Due   FOOT EXAM  Never done   OPHTHALMOLOGY EXAM  Never done   MAMMOGRAM  Never done   DEXA SCAN  Never done   INFLUENZA VACCINE  10/22/2020   HEMOGLOBIN A1C  02/01/2021   COLONOSCOPY (Pts 45-55yrs Insurance coverage will need to be confirmed)  05/20/2028   PNA vac Low Risk Adult  Completed   No flowsheet data found.   Vitals:   10/18/20 1049  BP: 138/74  Pulse: 78  Resp: 18  Temp: (!) 97.3 F (36.3 C)  Weight: 150 lb 6.4 oz (68.2 kg)  Height: 5\' 3"  (1.6 m)   Body mass index is 26.64 kg/m.  Physical Exam  GENERAL APPEARANCE: Well nourished. In no acute distress.  SKIN:  Mid back wound with dressing. MOUTH and THROAT: Lips are without lesions. Oral mucosa is moist and without lesions.  RESPIRATORY: Breathing is even & unlabored, BS CTAB CARDIAC: RRR, no murmur,no extra heart sounds, BLE 1-2+ edema GI: Abdomen soft, normal BS, no masses, no tenderness EXTREMITIES:  Able to move X 4 extremities. NEUROLOGICAL: There is no tremor. Speech is clear. Alert and oriented X 3. PSYCHIATRIC:  Affect and behavior are appropriate  Labs reviewed: Recent Labs    08/01/20 0559 08/01/20 1226 08/03/20 0804 08/04/20 0425 08/30/20 1339 08/30/20 2057  08/31/20 0507 09/01/20 1514 09/29/20 0000 10/17/20 0000  NA 137   < > 139   < > 131*  --  133* 136 134* 138  K 5.6*   < > 4.2   < > 5.1  --  6.2* 5.2* 4.9 5.0  CL 107   < > 111   < > 101  --  102 104 96* 97*  CO2 22   < > 20*   < > 20*  --  22 24 24* 23*  GLUCOSE 194*   < > 76   < > 93  --  132* 49*  --   --   BUN 68*   < > 72*   < > 52*  --  47* 43* 56* 33*  CREATININE 2.68*   < > 2.04*   < >  3.30*   < > 3.13* 2.67* 1.9* 1.7*  CALCIUM 8.7*   < > 7.7*   < > 8.6*  --  8.9 8.8* 9.5 9.8  MG 1.9  --  2.0  --   --   --   --   --   --   --   PHOS 6.4*  --   --   --   --   --   --   --   --   --    < > = values in this interval not displayed.   Recent Labs    08/01/20 0559 08/03/20 1159  AST 21  --   ALT 15  --   ALKPHOS 86  --   BILITOT 0.7  --   PROT 6.4*  --   ALBUMIN 2.7* 2.4*   Recent Labs    08/06/20 7616 08/07/20 0208 08/08/20 0056 08/30/20 1339 08/30/20 2057 09/11/20 0000 10/17/20 0000  WBC 9.8 10.5 12.4* 9.8 8.7 6.6 9.0  NEUTROABS 7.2 7.2 9.3*  --   --   --   --   HGB 8.8* 8.5* 8.5* 9.1* 9.3* 8.6* 10.5*  HCT 27.3* 26.6* 26.9* 30.3* 30.6* 28* 32*  MCV 87.8 87.2 87.9 88.1 86.7  --   --   PLT 204 204 237 265 267 323 367   Lab Results  Component Value Date   TSH 1.188 08/01/2020   Lab Results  Component Value Date   HGBA1C 5.4 08/01/2020   Lab Results  Component Value Date   CHOL 116 08/31/2011   HDL 21 (L) 08/31/2011   LDLCALC 35 08/31/2011   TRIG 221 (H) 08/06/2020   CHOLHDL 5.5 08/31/2011    Significant Diagnostic Results in last 30 days:  No results found.  Assessment/Plan  1. Wound dehiscence -  S/P debridement on 08/30/20, wound VAC was discontinued -  wound treatment: Cleanse mid back surgical incision with NS.  Apply silver collagen and foam dressing daily - HYDROcodone-acetaminophen (NORCO/VICODIN) 5-325 MG tablet; Take 2 tablets by mouth every 6 (six) hours as needed for moderate pain or severe pain.  Dispense: 30 tablet; Refill: 0  2.  Elevated d-dimer -  d-dimer 16.4 - apixaban (ELIQUIS) 2.5 MG TABS tablet; Take 1 tablet (2.5 mg total) by mouth 2 (two) times daily. Elevated D-Dimer  Dispense: 60 tablet; Refill: 0 -  follow up with PCP for repeat d-dimer in 2 weeks  3. Chronic diastolic (congestive) heart failure (HCC) - furosemide (LASIX) 40 MG tablet; Take 1 tablet (40 mg total) by mouth in the morning. (0900)  Dispense: 30 tablet; Refill: 0  4. Chronic kidney disease (CKD), stage IV (severe) (HCC) Lab Results  Component Value Date   NA 138 10/17/2020   K 5.0 10/17/2020   CO2 23 (A) 10/17/2020   GLUCOSE 49 (L) 09/01/2020   BUN 33 (A) 10/17/2020   CREATININE 1.7 (A) 10/17/2020   CALCIUM 9.8 10/17/2020   GFRNONAA 28.85 10/17/2020   GFRAA 33.44 10/17/2020   -  stable  5. Primary hypertension - atenolol (TENORMIN) 100 MG tablet; Take 1 tablet (100 mg total) by mouth in the morning. (0900)  Dispense: 30 tablet; Refill: 0 - cloNIDine (CATAPRES) 0.2 MG tablet; Take 1 tablet (0.2 mg total) by mouth 2 (two) times daily.  Dispense: 60 tablet; Refill: 0  6. Type 2 diabetes mellitus with stage 4 chronic kidney disease, without long-term current use of insulin (HCC) Lab Results  Component Value Date   HGBA1C 5.4  08/01/2020   - sitaGLIPtin (JANUVIA) 50 MG tablet; Take 1 tablet (50 mg total) by mouth in the morning. (0900)  Dispense: 30 tablet; Refill: 0  7. 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 TAKE 1/2 TABLET (50MG ) BY MOUTH EVERY OTHER DAY FOR GOUT  Dispense: 7.5 tablet; Refill: 0  8. Acute blood loss anemia Lab Results  Component Value Date   WBC 9.0 10/17/2020   HGB 10.5 (A) 10/17/2020   HCT 32 (A) 10/17/2020   MCV 86.7 08/30/2020   PLT 367 10/17/2020    - ferrous sulfate 325 (65 FE) MG tablet; Take 1 tablet (325 mg total) by mouth daily with breakfast. (0900)  Dispense: 30 tablet; Refill:  0  9. Cough - guaiFENesin (ROBITUSSIN) 100 MG/5ML liquid; Take 10 mLs (200 mg total) by mouth 3 (three) times daily for 14 days. Give 82ml by mouth for cough x for two weeks  Dispense: 420 mL; Refill: 0  10.  Pleural effusion -  chest x-ray done on 10/12/20 showed large right and small left pleural effusion -   will need a repeat chest x-ray in 1-2 weeks    I have filled out patient's discharge paperwork and e-.  Patient will have home health PT, OT and Nursing.  DME provided:  wheelchair, 3-in-1, semi-electric hospital bed, shower transfer bench  Wheelchair  -patient suffers from T10 fracture which impairs her ability to perform daily activities like toileting, dressing, grooming and bathing in the home.  A cane or walker will not resolve issue with performing activities of daily living.  A wheelchair will allow patient to safely perform daily activities.  Patient can safely propel the wheelchair in the home.  Semielectric hospital bed   -she suffers from chronic diastolic heart failure and has trouble breathing at night when head is elevated less than 30 degrees or more.  Bed wedges do not provide enough elevation to resolve breathing issues.  Shortness of breath constipation to require frequent and immediate changes in body position which cannot be achieved with a normal bed.   Total discharge time: Greater than 30 minutes Greater than 50% was spent in counseling and coordination of care.  Discharge time involved coordination of the discharge process with social worker, nursing staff and therapy department. Medical justification for home health services/DME verified.   Durenda Age, DNP, MSN, FNP-BC [facility] [phone] (Monday-Friday 8:00 a.m. - 5:00 p.m.) [phone] (after hours)

## 2020-10-29 ENCOUNTER — Encounter (HOSPITAL_COMMUNITY): Payer: Self-pay | Admitting: Neurosurgery

## 2020-11-10 ENCOUNTER — Other Ambulatory Visit: Payer: Self-pay | Admitting: Adult Health

## 2020-11-10 DIAGNOSIS — M1A0120 Idiopathic chronic gout, left shoulder, without tophus (tophi): Secondary | ICD-10-CM

## 2020-11-14 ENCOUNTER — Other Ambulatory Visit: Payer: Self-pay | Admitting: Adult Health

## 2020-11-14 DIAGNOSIS — N184 Chronic kidney disease, stage 4 (severe): Secondary | ICD-10-CM

## 2020-11-14 DIAGNOSIS — E1122 Type 2 diabetes mellitus with diabetic chronic kidney disease: Secondary | ICD-10-CM

## 2020-11-28 ENCOUNTER — Other Ambulatory Visit: Payer: Self-pay | Admitting: Adult Health

## 2020-11-28 DIAGNOSIS — R7989 Other specified abnormal findings of blood chemistry: Secondary | ICD-10-CM

## 2020-12-03 ENCOUNTER — Other Ambulatory Visit: Payer: Self-pay

## 2020-12-03 ENCOUNTER — Encounter: Payer: Self-pay | Admitting: Pulmonary Disease

## 2020-12-03 ENCOUNTER — Ambulatory Visit: Payer: Medicare PPO | Admitting: Pulmonary Disease

## 2020-12-03 ENCOUNTER — Ambulatory Visit (INDEPENDENT_AMBULATORY_CARE_PROVIDER_SITE_OTHER): Payer: Medicare PPO

## 2020-12-03 VITALS — BP 98/58 | HR 82 | Temp 97.8°F | Ht 64.0 in | Wt 146.8 lb

## 2020-12-03 DIAGNOSIS — I5189 Other ill-defined heart diseases: Secondary | ICD-10-CM

## 2020-12-03 DIAGNOSIS — J90 Pleural effusion, not elsewhere classified: Secondary | ICD-10-CM

## 2020-12-03 LAB — BASIC METABOLIC PANEL
BUN: 48 mg/dL — ABNORMAL HIGH (ref 6–23)
CO2: 23 mEq/L (ref 19–32)
Calcium: 10.2 mg/dL (ref 8.4–10.5)
Chloride: 105 mEq/L (ref 96–112)
Creatinine, Ser: 1.64 mg/dL — ABNORMAL HIGH (ref 0.40–1.20)
GFR: 30.7 mL/min — ABNORMAL LOW (ref >60.00)
Glucose, Bld: 171 mg/dL — ABNORMAL HIGH (ref 70–99)
Potassium: 4.3 mEq/L (ref 3.5–5.1)
Sodium: 137 mEq/L (ref 135–145)

## 2020-12-03 LAB — MAGNESIUM: Magnesium: 1.8 mg/dL (ref 1.5–2.5)

## 2020-12-03 IMAGING — DX DG CHEST 2V
2 series · 2 of 2 positions shown · non-contrast
Comparison: [DATE], thoracic plain film [DATE]

CLINICAL DATA: 75-year-old female with a history of pleural
effusion

EXAM:
CHEST - 2 VIEW

[chest pa]
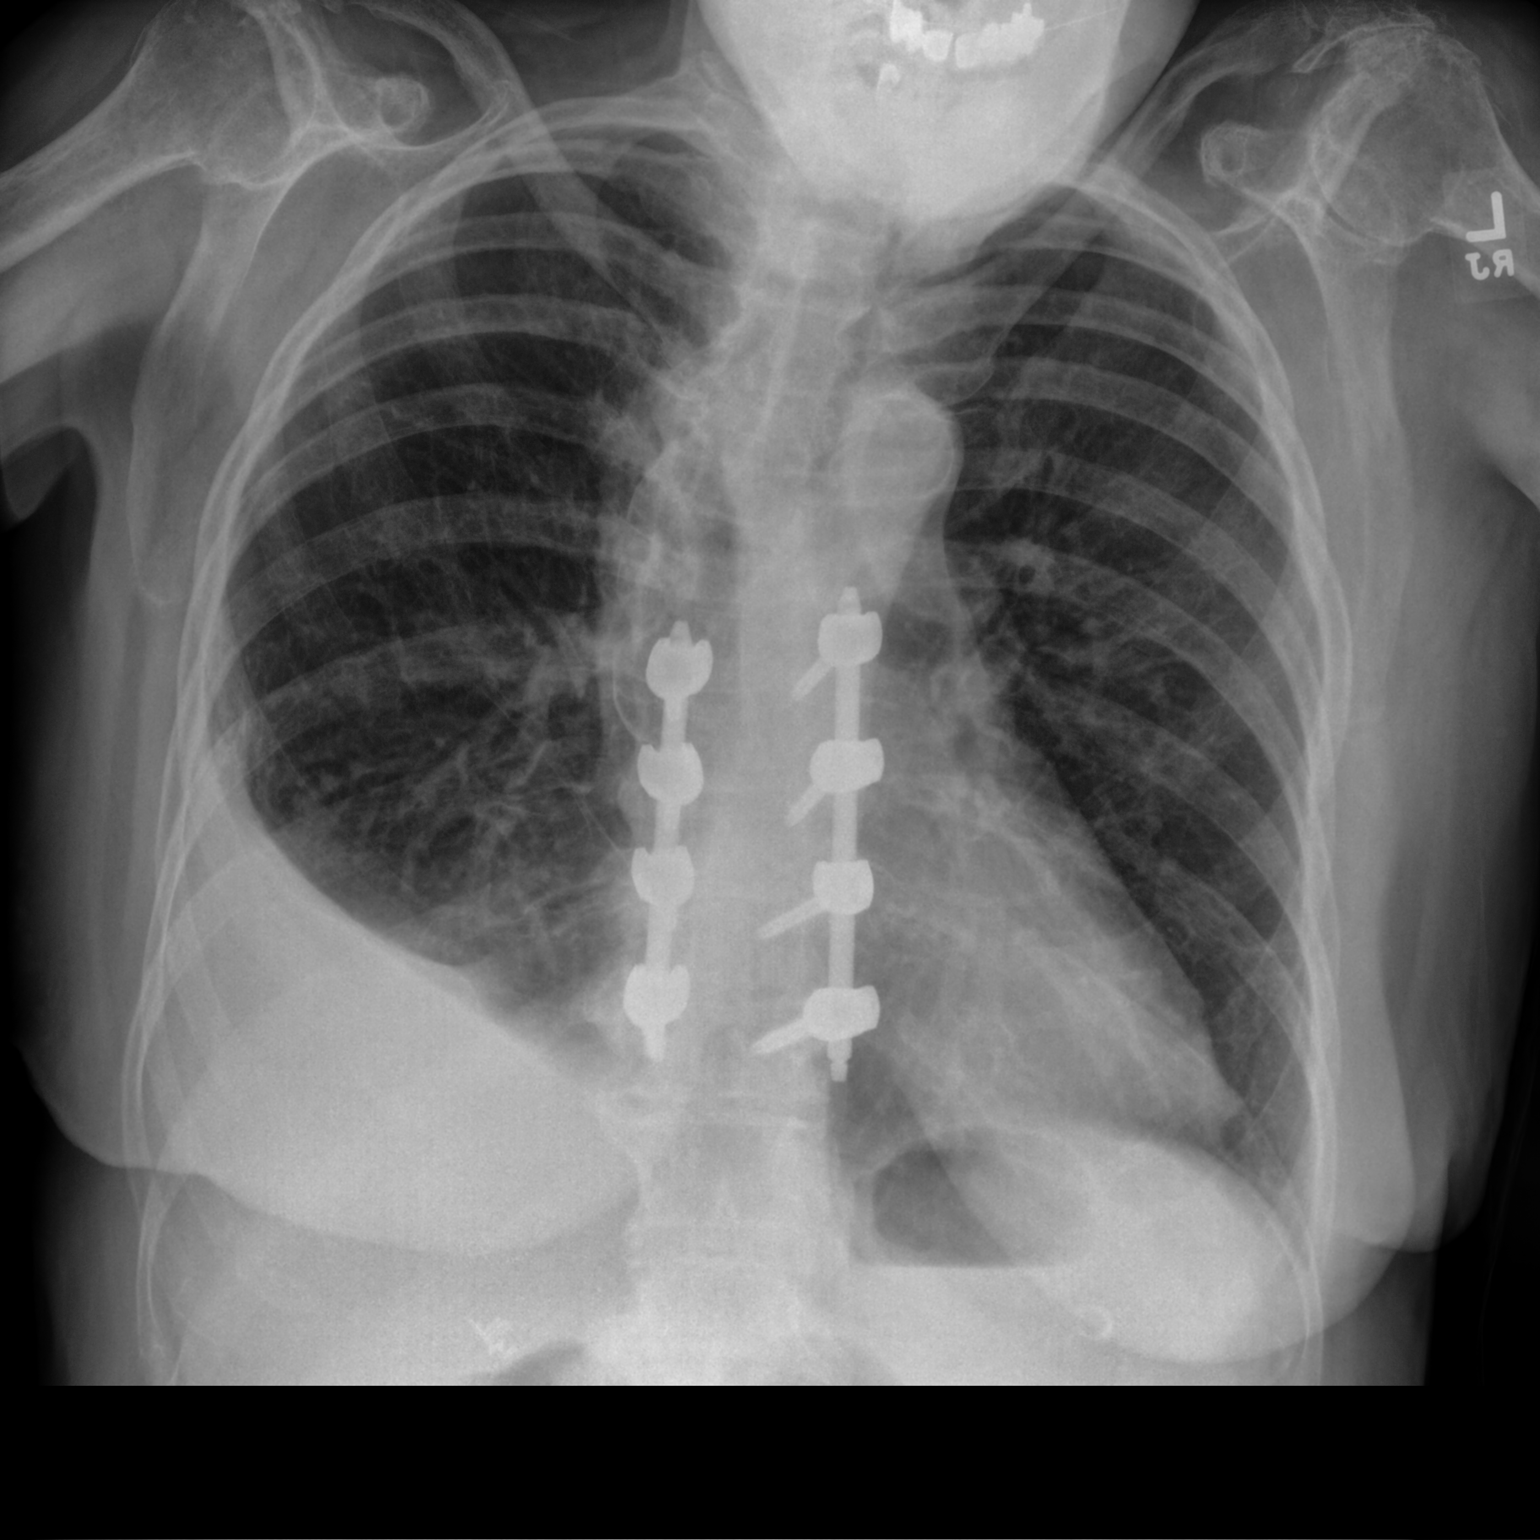

[chest lat]
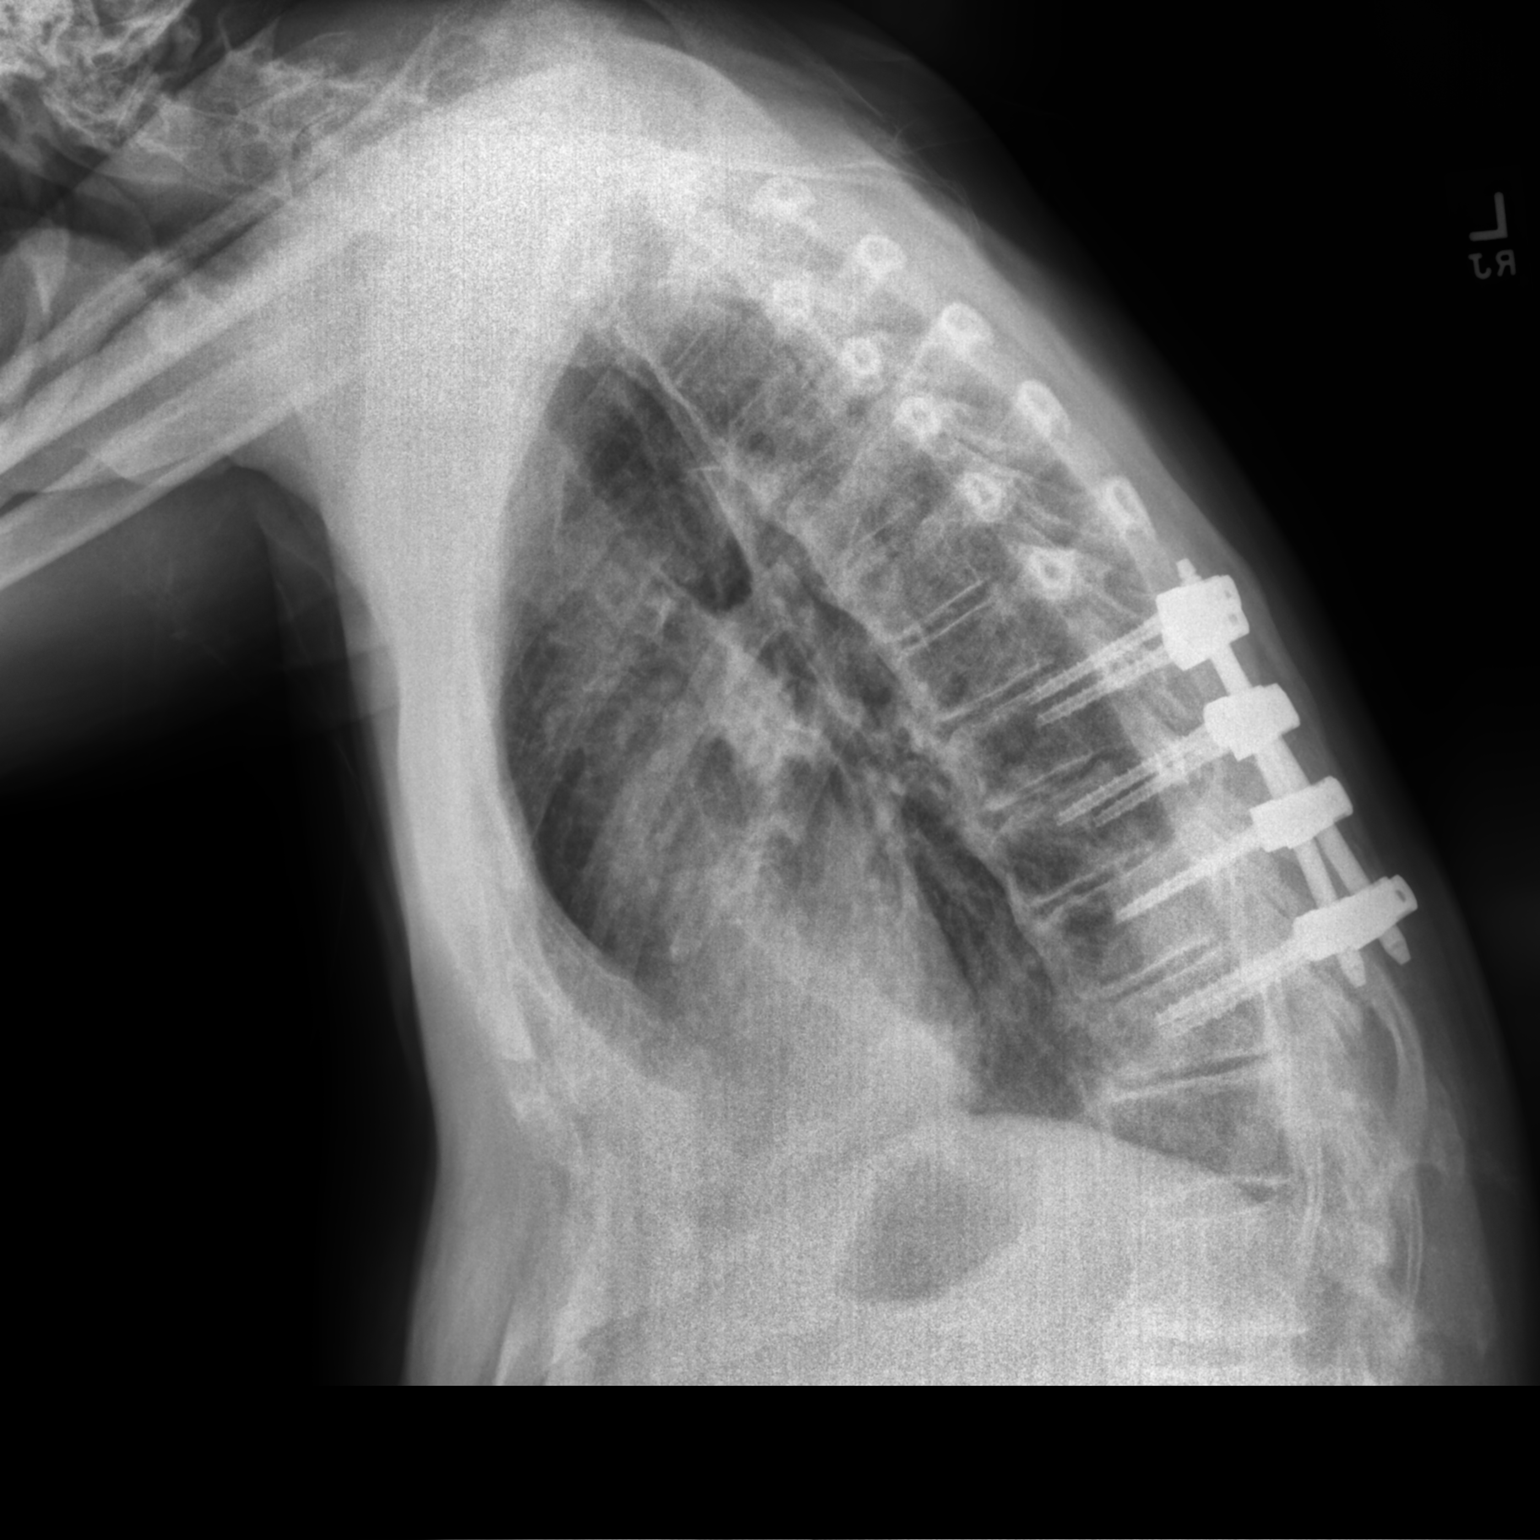

[2 of 2 positions shown; findings below may reference images not displayed]

FINDINGS: Cardiomediastinal silhouette unchanged in size and contour. No
evidence of central vascular congestion. No interlobular septal
thickening.

Opacity at the right lung base obscuring the right hemidiaphragm, is
relatively unchanged when compared to the thoracic plain film
[DATE] along the medial component.

No confluent airspace disease in the left lung or the residual right
lung.

Mild bronchial wall thickening.

Coarsened interstitial markings persist.

No acute displaced fracture. Surgical changes of lower thoracic
fixation with no hardware fracture identified.
IMPRESSION: Opacity at the right lung base, persisting from the thoracic plain
film dated [DATE], likely a combination of pleural fluid,
atelectasis, and/or consolidation.

Surgical changes of the thoracic spine.

## 2020-12-03 NOTE — Progress Notes (Signed)
@Patient  ID: Jacqueline Richmond, female    DOB: [birth year], 75 y.o.   MRN: [id number]  Chief Complaint  Patient presents with   Consult    Pt states SOB, and leg drainage.     Referring provider: Bernerd Limbo, MD  HPI:   75 y.o. woman whom we are seeing in consultation for evaluation of dyspnea on exertion and bilateral pleural effusions.  Discharge summary x2 reviewed from hospitalization earlier in the year.  Most recent pulmonary consult note during hospitalization reviewed.  Patient reports no pulmonary issues until her back surgery May 2022.  She developed hypoxemia and respiratory issues which prompted chest x-ray that on 08/02/2020 revealed small bilateral pleural effusions and vascular congestion consistent with pulmonary edema and fluid overload on my interpretation.  Thoracentesis planned with IR.  She was lied flat and suddenly coded.  The procedure was not started, no needles etc. were inserted.  She was intubated and taken to the ICU.  Concern for seizure-like activity at the time.  Thoracentesis were performed.  Presumably this was concern for hemothorax based on notes with positive swirl sign on ultrasound.  Report of thoracentesis performed next day 08/03/2020 reviewed and described as bloody.  However there is no hematocrit or RBC count to further evaluate.  Was exudative via lights criteria, inflammatory.  Neutrophilic predominant.  She was socially extubated.  Course complicated by acute renal failure.  She was simply discharged from the hospital 08/08/2020.  Readmitted early June 2022 with dehydration and then again mid September 10, 2000 with wound dehiscence.  She has had ongoing shortness of breath.  Most noticeable when lying flat.  Some with exertion but not as bad when lying flat.  In addition she describes worsening lower extremity swelling.  To the point where she is developed fluid-filled blisters.  These have popped and reportedly filled with clear fluid.  Legs are increasingly  swelling.  Today they are wrapped in Ace bandages and a bandages are soaked as well as the bottom of her pants.  She had a chest x-ray 10/13/2020 reportedly bilateral right and left pleural effusions.  I cannot view these images.  This prompted referral.   PMH: Diastolic dysfunction, hypertension, diabetes, hyperlipidemia, chronic kidney disease Surgical history: Nasal reconstruction Family history: Mother with CAD, father with CAD Social history: Never smoker, lives in [place] / Pulmonary Flowsheets:   ACT:  No flowsheet data found.  MMRC: No flowsheet data found.  Epworth:  No flowsheet data found.  Tests:   FENO:  No results found for: NITRICOXIDE  PFT: No flowsheet data found.  WALK:  No flowsheet data found.  Imaging: Personally reviewed and as per EMR and discussion in this note  Lab Results: Personally reviewed and as per EMR, notably improving anemia CBC    Component Value Date/Time   WBC 9.0 10/17/2020 0000   WBC 8.7 08/30/2020 2057   RBC 4.17 10/17/2020 0000   HGB 10.5 (A) 10/17/2020 0000   HCT 32 (A) 10/17/2020 0000   PLT 367 10/17/2020 0000   MCV 86.7 08/30/2020 2057   MCH 26.3 08/30/2020 2057   MCHC 30.4 08/30/2020 2057   RDW 16.6 (H) 08/30/2020 2057   LYMPHSABS 1.6 08/08/2020 0056   MONOABS 1.0 08/08/2020 0056   EOSABS 0.3 08/08/2020 0056   BASOSABS 0.1 08/08/2020 0056    BMET    Component Value Date/Time   NA 138 10/17/2020 0000   K 5.0 10/17/2020 0000   CL 97 (A) 10/17/2020  0000   CO2 23 (A) 10/17/2020 0000   GLUCOSE 49 (L) 09/01/2020 1514   BUN 33 (A) 10/17/2020 0000   CREATININE 1.7 (A) 10/17/2020 0000   CREATININE 2.67 (H) 09/01/2020 1514   CALCIUM 9.8 10/17/2020 0000   GFRNONAA 28.85 10/17/2020 0000   GFRNONAA 18 (L) 09/01/2020 1514   GFRAA 33.44 10/17/2020 0000    BNP No results found for: BNP  ProBNP No results found for: PROBNP  Specialty Problems       Pulmonary Problems   Difficult intubation     small mouth opening      Hemothorax on right    Posttraumatic 08/02/2020 prethoracentesis patient had AMS with acute hypoxic respiratory failure; intubation was required. Subsequently thoracentesis performed with removal of 1800 cc of bloody fluid, exudative in nature.       Allergies  Allergen Reactions   Codeine Itching   Iodinated Diagnostic Agents     Other reaction(s): Unknown   Pentazocine Other (See Comments)    Hallucinations   Statins     Immunization History  Administered Date(s) Administered   Fluad Quad(high Dose 65+) 12/03/2018   Influenza Split 12/22/2008, 11/30/2009, 12/13/2010, 12/25/2011   Influenza, High Dose Seasonal PF 01/11/2015, 11/23/2017, 12/03/2018, 12/13/2019   Influenza, Seasonal, Injecte, Preservative Fre 12/16/2013   Influenza,inj,Quad PF,6+ Mos 12/11/2015, 11/14/2016   Influenza,inj,Quad PF,6-35 Mos 12/11/2015, 11/14/2016   Influenza,inj,quad, With Preservative 12/16/2013   PFIZER(Purple Top)SARS-COV-2 Vaccination 05/01/2019, 05/26/2019, 11/22/2019, 07/17/2020   Pneumococcal Conjugate-13 01/16/2012   Pneumococcal Polysaccharide-23 03/24/2005, 08/08/2014   Tdap 09/13/2010    Past Medical History:  Diagnosis Date   Allergy    Anemia    Arthritis    Cataract    biltaerally    CKD (chronic kidney disease) stage 3, GFR 30-59 ml/min ([facility]) 08/29/2011   per patient, kidneys monitored by Dr Posey Pronto endocrinologist ,lov was  05-18-2018, "kidney fx stable"   Colon polyp    Complication of anesthesia    Difficult intubation    small mouth opening   Diverticulosis    Gout    HTN (hypertension) 08/29/2011   Hyperlipemia    Psoriasis    Type II or unspecified type diabetes mellitus without mention of complication, not stated as uncontrolled 08/29/2011    Tobacco History: Social History   Tobacco Use  Smoking Status Never  Smokeless Tobacco Never   Counseling given: Not Answered   Continue to not smoke  Outpatient Encounter Medications as  of 12/03/2020  Medication Sig   allopurinol (ZYLOPRIM) 100 MG tablet Take 0.5 tablets (50 mg total) by mouth every other day. TAKE 1/2 TABLET (50MG ) BY MOUTH EVERY OTHER DAY FOR GOUT   apixaban (ELIQUIS) 2.5 MG TABS tablet Take 1 tablet (2.5 mg total) by mouth 2 (two) times daily. Elevated D-Dimer   atenolol (TENORMIN) 100 MG tablet Take 1 tablet (100 mg total) by mouth in the morning. (0900)   b complex vitamins tablet Take 1 tablet by mouth in the morning. (0900)   bisacodyl (DULCOLAX) 10 MG suppository Place 10 mg rectally as needed for moderate constipation.   cloNIDine (CATAPRES) 0.2 MG tablet Take 1 tablet (0.2 mg total) by mouth 2 (two) times daily.   feeding supplement (ENSURE ENLIVE / ENSURE PLUS) LIQD Take 237 mLs by mouth 3 (three) times daily between meals.   ferrous sulfate 325 (65 FE) MG tablet Take 1 tablet (325 mg total) by mouth daily with breakfast. (0900)   furosemide (LASIX) 40 MG tablet Take 1 tablet (40 mg  total) by mouth in the morning. (0900)   HYDROcodone-acetaminophen (NORCO/VICODIN) 5-325 MG tablet Take 2 tablets by mouth every 6 (six) hours as needed for moderate pain or severe pain.   loratadine (CLARITIN) 10 MG tablet Take 1 tablet (10 mg total) by mouth in the morning. (0900)   magnesium hydroxide (MILK OF MAGNESIA) 400 MG/5ML suspension Take 30 mLs by mouth daily as needed for mild constipation.   Menthol, Topical Analgesic, (BIOFREEZE) 4 % GEL Apply 1 application topically as directed. To right foot three times daily for pain   mometasone (ELOCON) 0.1 % cream Apply 1 application topically daily as needed (psoriasis).   Multiple Vitamin (MULTIVITAMIN WITH MINERALS) TABS tablet Take 1 tablet by mouth daily.   NON FORMULARY Diet: Heart Healthy, NAS, Regular textute Thin liquids   Omega-3 1000 MG CAPS Take 1,000 mg by mouth 2 (two) times daily. (0900 & 2100)   ondansetron (ZOFRAN) 4 MG tablet Take 4 mg by mouth every 8 (eight) hours as needed for nausea or vomiting.    polyethylene glycol (MIRALAX / GLYCOLAX) 17 g packet Take 17 g by mouth daily.   sitaGLIPtin (JANUVIA) 50 MG tablet Take 1 tablet (50 mg total) by mouth in the morning. (0900)   Sodium Phosphates (RA SALINE ENEMA RE) Place 1 Dose rectally as needed. If not relieved by Biscodyl suppository, givedisposable Saline Enema rectally X 1 dose/24 hrs as needed   No facility-administered encounter medications on file as of 12/03/2020.     Review of Systems  Review of Systems  No chest pain with exertion or otherwise.  No syncope or presyncope.  Comprehensive review of systems otherwise negative. Physical Exam  BP (!) 98/58 (BP Location: Right Arm, Patient Position: Sitting, Cuff Size: Normal)   Pulse 82   Temp 97.8 F (36.6 C) (Oral)   Ht 5\' 4"  (1.626 m)   Wt 146 lb 12.8 oz (66.6 kg)   SpO2 99%   BMI 25.20 kg/m   Wt Readings from Last 5 Encounters:  12/03/20 146 lb 12.8 oz (66.6 kg)  10/18/20 150 lb 6.4 oz (68.2 kg)  10/17/20 150 lb 6.4 oz (68.2 kg)  10/12/20 152 lb 3.2 oz (69 kg)  10/05/20 158 lb (71.7 kg)    BMI Readings from Last 5 Encounters:  12/03/20 25.20 kg/m  10/18/20 26.64 kg/m  10/17/20 26.64 kg/m  10/12/20 26.96 kg/m  10/05/20 27.99 kg/m     Physical Exam General: Elderly, in no acute distress Eyes: EOMI, icterus Neck: Supple, cannot appreciate JVP sitting upright Pulmonary: Absent breath sounds nearly all the way up on the right posteriorly, absent breath sounds left base, crackles mid lung field on left, good excursion Cardiovascular: Regular rate and rhythm, normal Abdomen: Nondistended, bowel sounds present MSK: No synovitis, joint effusion Extremities: Tense deeply pitting edema bilaterally with wrapped ankles and Ace bandages, soaked in fluid as well as bottom of sweatpants, blistering bilaterally with clear appearing fluid Neuro: No weakness, slow gait Psych: Normal mood, full affect   Assessment & Plan:   Pleural effusions: In setting diastolic  dysfunction, orthopnea, worsening bilateral lower extremity swelling with blisters and oozing, most likely related to backup from the heart.  Right greater than left.  Possible hemothorax in the past given description of positive swirl sign on ultrasound.  Unfortunately fluid studies not very descriptive, no RBC, no hematocrit.  I think she should increase her Lasix as discussed at length today.  In addition, given concern for prior hemothorax, I did recommend repeat  thoracentesis on the larger side, reportedly right on recent chest x-ray.  She declines this at this time.  Will order chest x-ray today for further evaluation.  Cannot see most recent chest imaging 09/2020.  Diastolic CHF: Appears decompensated with worsening lower extremity swelling, orthopnea, bilateral pleural effusions.  Increase Lasix from 40 mg daily to 60 mg daily.  Labs today.  Repeat in the week.  High intensity monitoring given her significant CKD in the past is warranted.  Decide dosing moving forward based on response to increased dose as well as laboratory values.  Referral to cardiology for ongoing management today.   Return in about 3 weeks (around 12/24/2020).   Lanier Clam, MD 12/03/2020

## 2020-12-03 NOTE — Patient Instructions (Addendum)
Nice to meet you  Increase lasix to 60 mg daily - take 1 and a half tabs daily starting tomorrow. Call us Thursday or Friday and let us know if you are urinating more.  We will get labs today and repeat Friday morning to make sure kidney function is tolerating the change.  I sent a referral to cardiology to help [place] with the fluid build up  We will get a CXR today.  Return to clinic in 2-4 weeks with Dr. Silas Flood or NP

## 2020-12-04 ENCOUNTER — Telehealth: Payer: Self-pay | Admitting: Pulmonary Disease

## 2020-12-04 DIAGNOSIS — Z5181 Encounter for therapeutic drug level monitoring: Secondary | ICD-10-CM

## 2020-12-04 NOTE — Telephone Encounter (Signed)
Pt calling again.

## 2020-12-04 NOTE — Telephone Encounter (Signed)
Called and spoke with patient. She was under the impression that she needed to increase the Lasix to 50mg . I went over the AVS with her and explained that she is to take a whole 40mg  tablet plus a half tablet (20mg ) to make the total 60mg . She verbalized understanding and stated that she did have enough tablets to do this.   I also reminded her to come in Friday morning for labs. She confirmed that she would be here.   Orders have placed for labs.   Nothing further needed at time of call.

## 2020-12-07 ENCOUNTER — Emergency Department (HOSPITAL_BASED_OUTPATIENT_CLINIC_OR_DEPARTMENT_OTHER)
Admission: EM | Admit: 2020-12-07 | Discharge: 2020-12-07 | Disposition: A | Discharge status: Home / Self Care | Payer: Medicare PPO | Attending: Emergency Medicine | Admitting: Emergency Medicine

## 2020-12-07 ENCOUNTER — Emergency Department (HOSPITAL_BASED_OUTPATIENT_CLINIC_OR_DEPARTMENT_OTHER): Payer: Medicare PPO | Admitting: Radiology

## 2020-12-07 ENCOUNTER — Other Ambulatory Visit: Payer: Self-pay

## 2020-12-07 ENCOUNTER — Encounter (HOSPITAL_BASED_OUTPATIENT_CLINIC_OR_DEPARTMENT_OTHER): Payer: Self-pay

## 2020-12-07 DIAGNOSIS — X58XXXA Exposure to other specified factors, initial encounter: Secondary | ICD-10-CM | POA: Diagnosis not present

## 2020-12-07 DIAGNOSIS — Z96641 Presence of right artificial hip joint: Secondary | ICD-10-CM | POA: Insufficient documentation

## 2020-12-07 DIAGNOSIS — S81801A Unspecified open wound, right lower leg, initial encounter: Secondary | ICD-10-CM | POA: Diagnosis not present

## 2020-12-07 DIAGNOSIS — S8991XA Unspecified injury of right lower leg, initial encounter: Secondary | ICD-10-CM | POA: Diagnosis present

## 2020-12-07 DIAGNOSIS — E1122 Type 2 diabetes mellitus with diabetic chronic kidney disease: Secondary | ICD-10-CM | POA: Insufficient documentation

## 2020-12-07 DIAGNOSIS — Z7901 Long term (current) use of anticoagulants: Secondary | ICD-10-CM | POA: Diagnosis not present

## 2020-12-07 DIAGNOSIS — I878 Other specified disorders of veins: Secondary | ICD-10-CM | POA: Insufficient documentation

## 2020-12-07 DIAGNOSIS — N1831 Chronic kidney disease, stage 3a: Secondary | ICD-10-CM | POA: Diagnosis not present

## 2020-12-07 DIAGNOSIS — Z7984 Long term (current) use of oral hypoglycemic drugs: Secondary | ICD-10-CM | POA: Insufficient documentation

## 2020-12-07 DIAGNOSIS — Z79899 Other long term (current) drug therapy: Secondary | ICD-10-CM | POA: Diagnosis not present

## 2020-12-07 DIAGNOSIS — I130 Hypertensive heart and chronic kidney disease with heart failure and stage 1 through stage 4 chronic kidney disease, or unspecified chronic kidney disease: Secondary | ICD-10-CM | POA: Diagnosis not present

## 2020-12-07 DIAGNOSIS — I5030 Unspecified diastolic (congestive) heart failure: Secondary | ICD-10-CM | POA: Insufficient documentation

## 2020-12-07 LAB — BRAIN NATRIURETIC PEPTIDE: B Natriuretic Peptide: 126.4 pg/mL — ABNORMAL HIGH (ref 0.0–100.0)

## 2020-12-07 LAB — BASIC METABOLIC PANEL
Anion gap: 11 (ref 5–15)
BUN: 64 mg/dL — ABNORMAL HIGH (ref 8–23)
CO2: 22 mmol/L (ref 22–32)
Calcium: 10.3 mg/dL (ref 8.9–10.3)
Chloride: 105 mmol/L (ref 98–111)
Creatinine, Ser: 2.01 mg/dL — ABNORMAL HIGH (ref 0.44–1.00)
GFR, Estimated: 26 mL/min — ABNORMAL LOW (ref >60)
Glucose, Bld: 111 mg/dL — ABNORMAL HIGH (ref 70–99)
Potassium: 4.7 mmol/L (ref 3.5–5.1)
Sodium: 138 mmol/L (ref 135–145)

## 2020-12-07 LAB — CBC WITH DIFFERENTIAL/PLATELET
Abs Immature Granulocytes: 0.04 10*3/uL (ref 0.00–0.07)
Basophils Absolute: 0.1 10*3/uL (ref 0.0–0.1)
Basophils Relative: 1 %
Eosinophils Absolute: 0.1 10*3/uL (ref 0.0–0.5)
Eosinophils Relative: 1 %
HCT: 36.9 % (ref 36.0–46.0)
Hemoglobin: 11.3 g/dL — ABNORMAL LOW (ref 12.0–15.0)
Immature Granulocytes: 0 %
Lymphocytes Relative: 12 %
Lymphs Abs: 1.3 10*3/uL (ref 0.7–4.0)
MCH: 24.2 pg — ABNORMAL LOW (ref 26.0–34.0)
MCHC: 30.6 g/dL (ref 30.0–36.0)
MCV: 79 fL — ABNORMAL LOW (ref 80.0–100.0)
Monocytes Absolute: 0.7 10*3/uL (ref 0.1–1.0)
Monocytes Relative: 7 %
Neutro Abs: 8.4 10*3/uL — ABNORMAL HIGH (ref 1.7–7.7)
Neutrophils Relative %: 79 %
Platelets: 286 10*3/uL (ref 150–400)
RBC: 4.67 MIL/uL (ref 3.87–5.11)
RDW: 17 % — ABNORMAL HIGH (ref 11.5–15.5)
WBC: 10.7 10*3/uL — ABNORMAL HIGH (ref 4.0–10.5)
nRBC: 0 % (ref 0.0–0.2)

## 2020-12-07 IMAGING — DX DG FOOT COMPLETE 3+V*R*
2 series · 3 of 3 positions shown · non-contrast
Comparison: None.

CLINICAL DATA: Right lower extremity swelling.

EXAM:
RIGHT FOOT COMPLETE - 3+ VIEW

[Series 1: foot · 0.14mm/px · 2 of 2 slices shown]
[im 1/2]
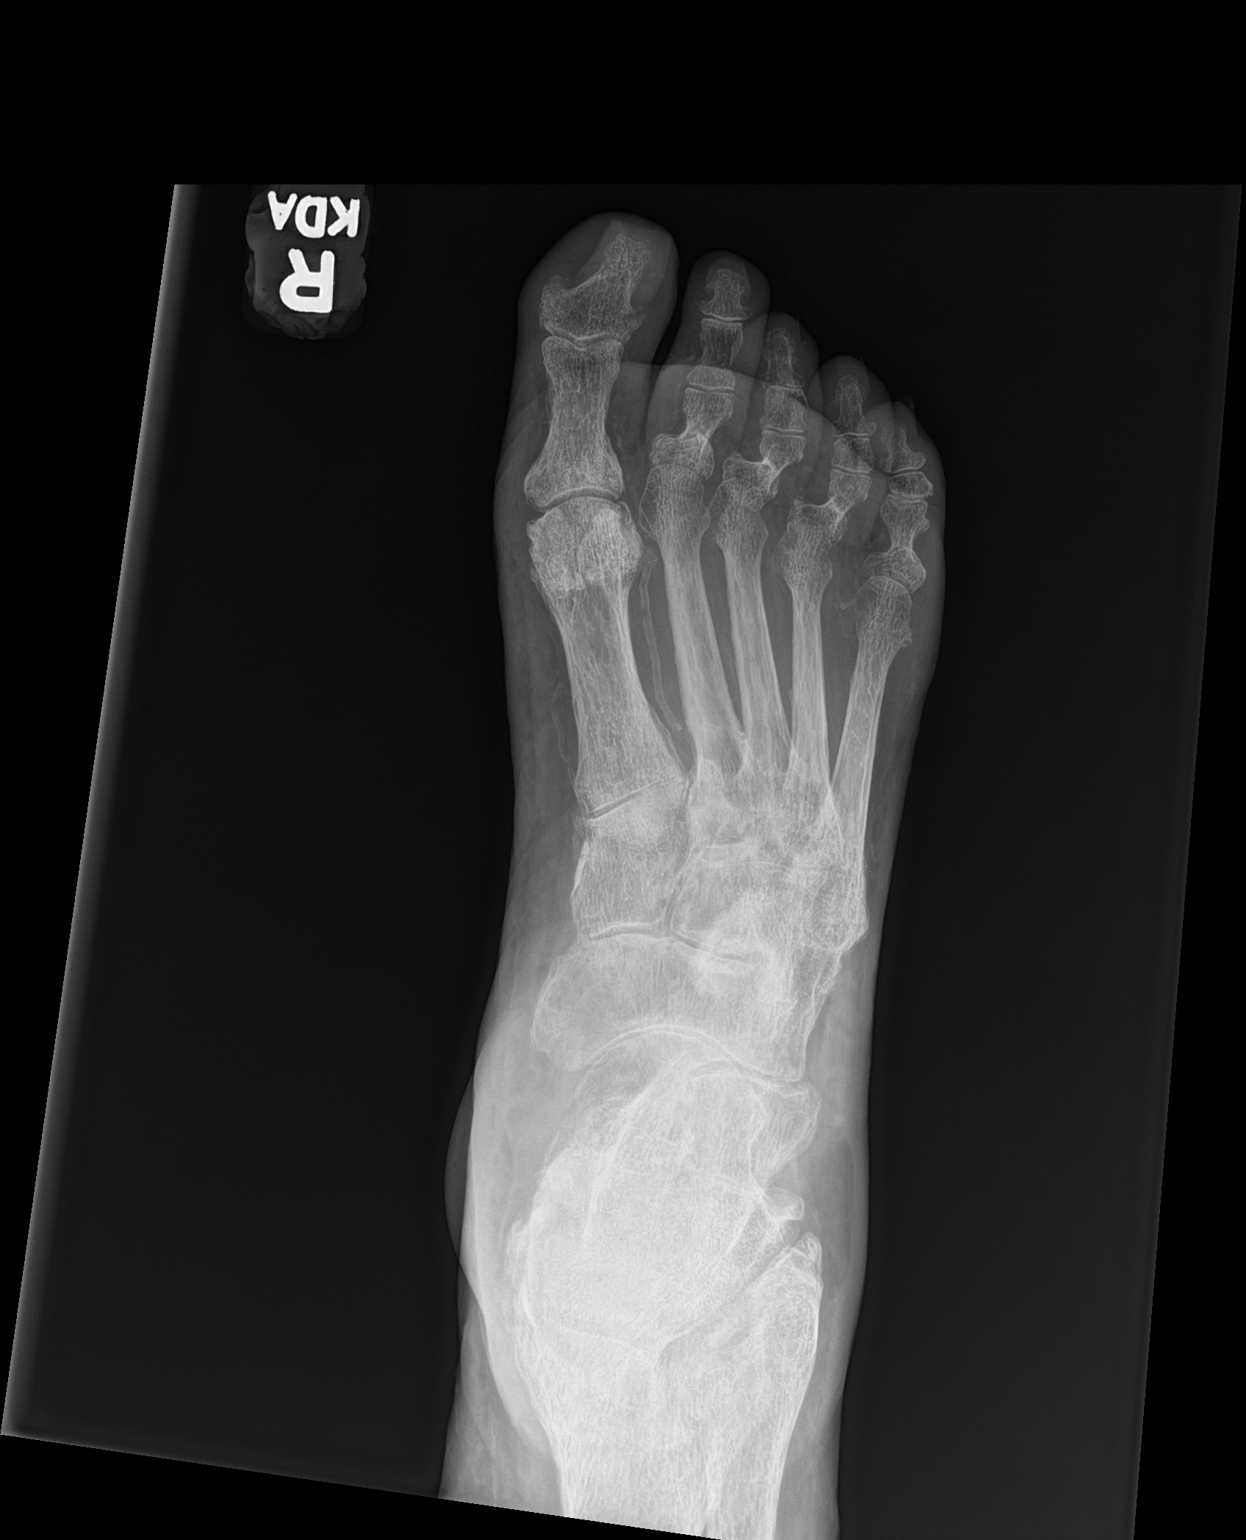
[im 2/2]
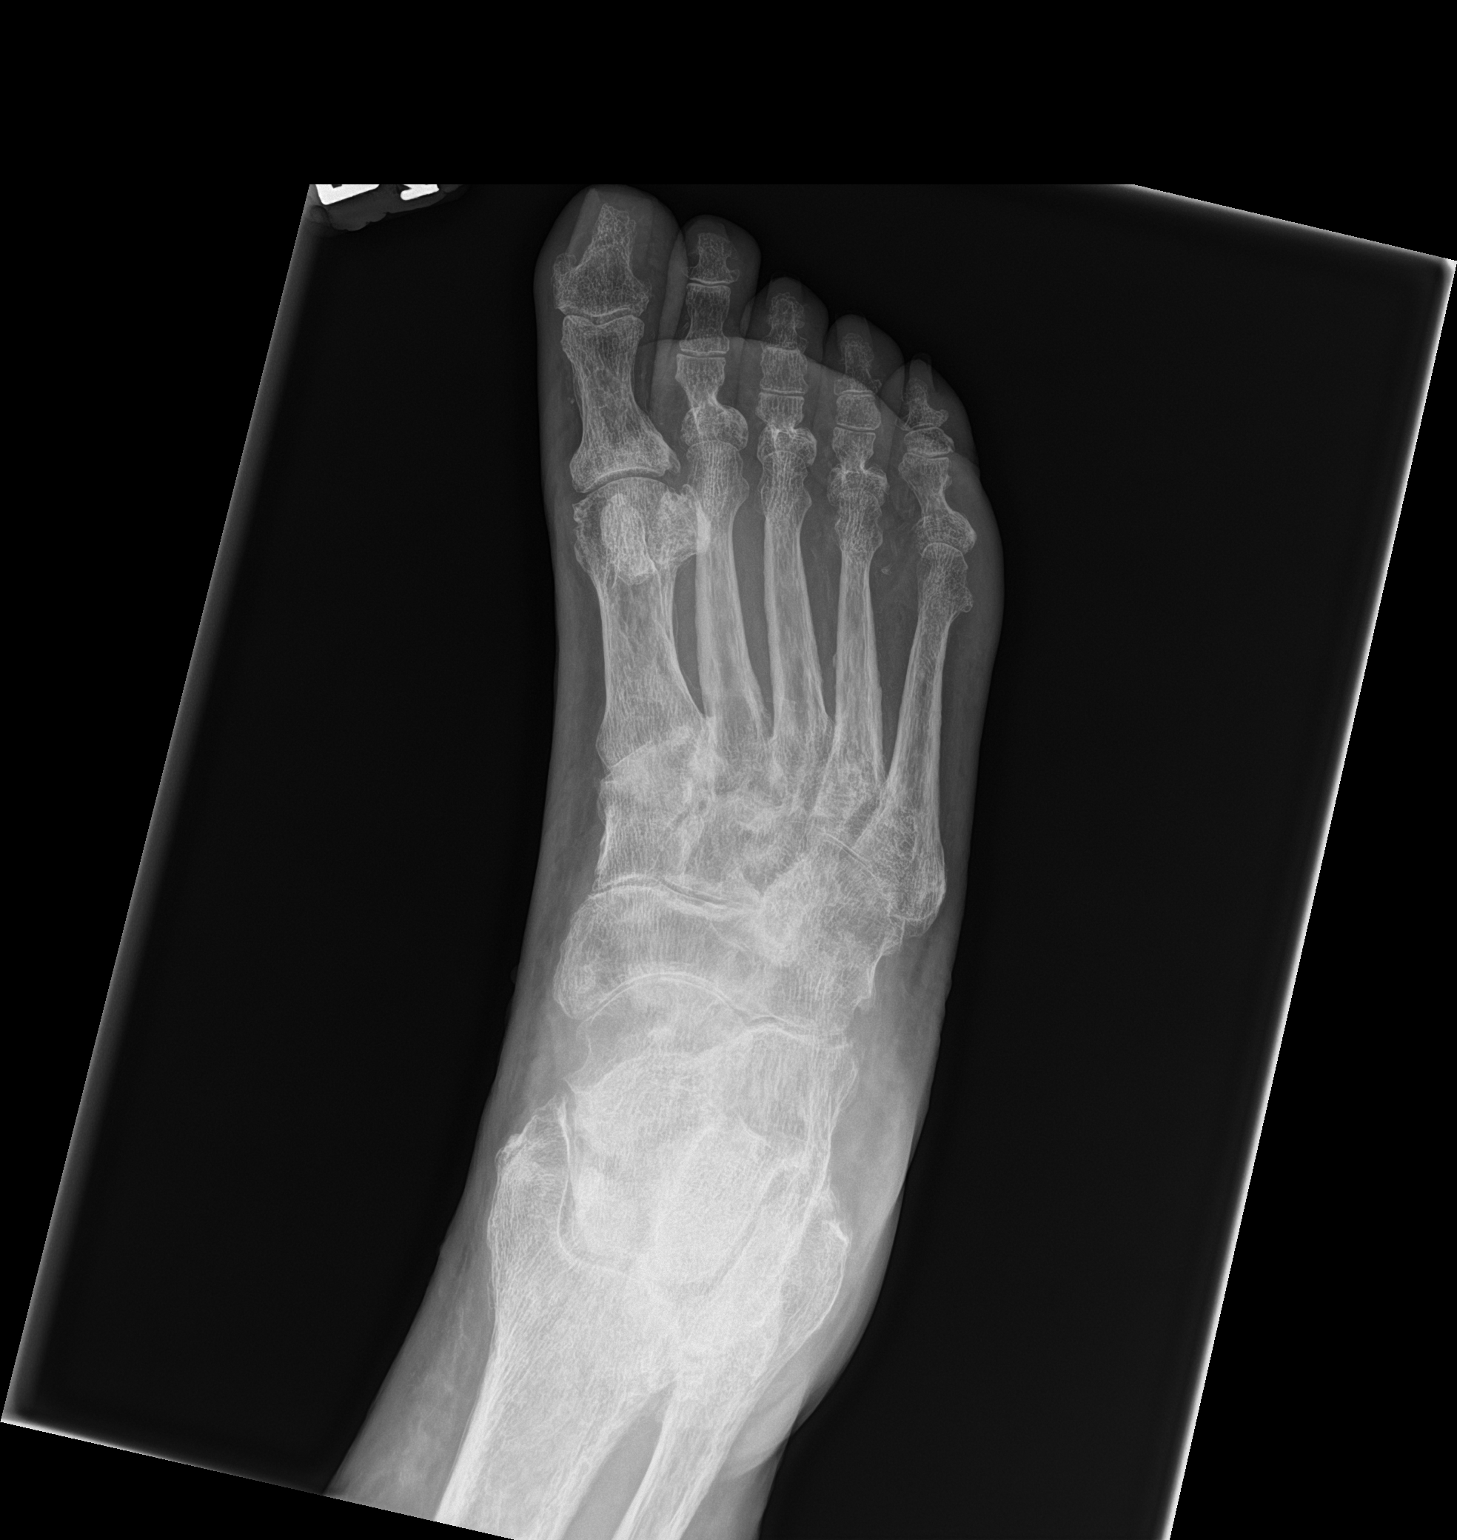

[leg]
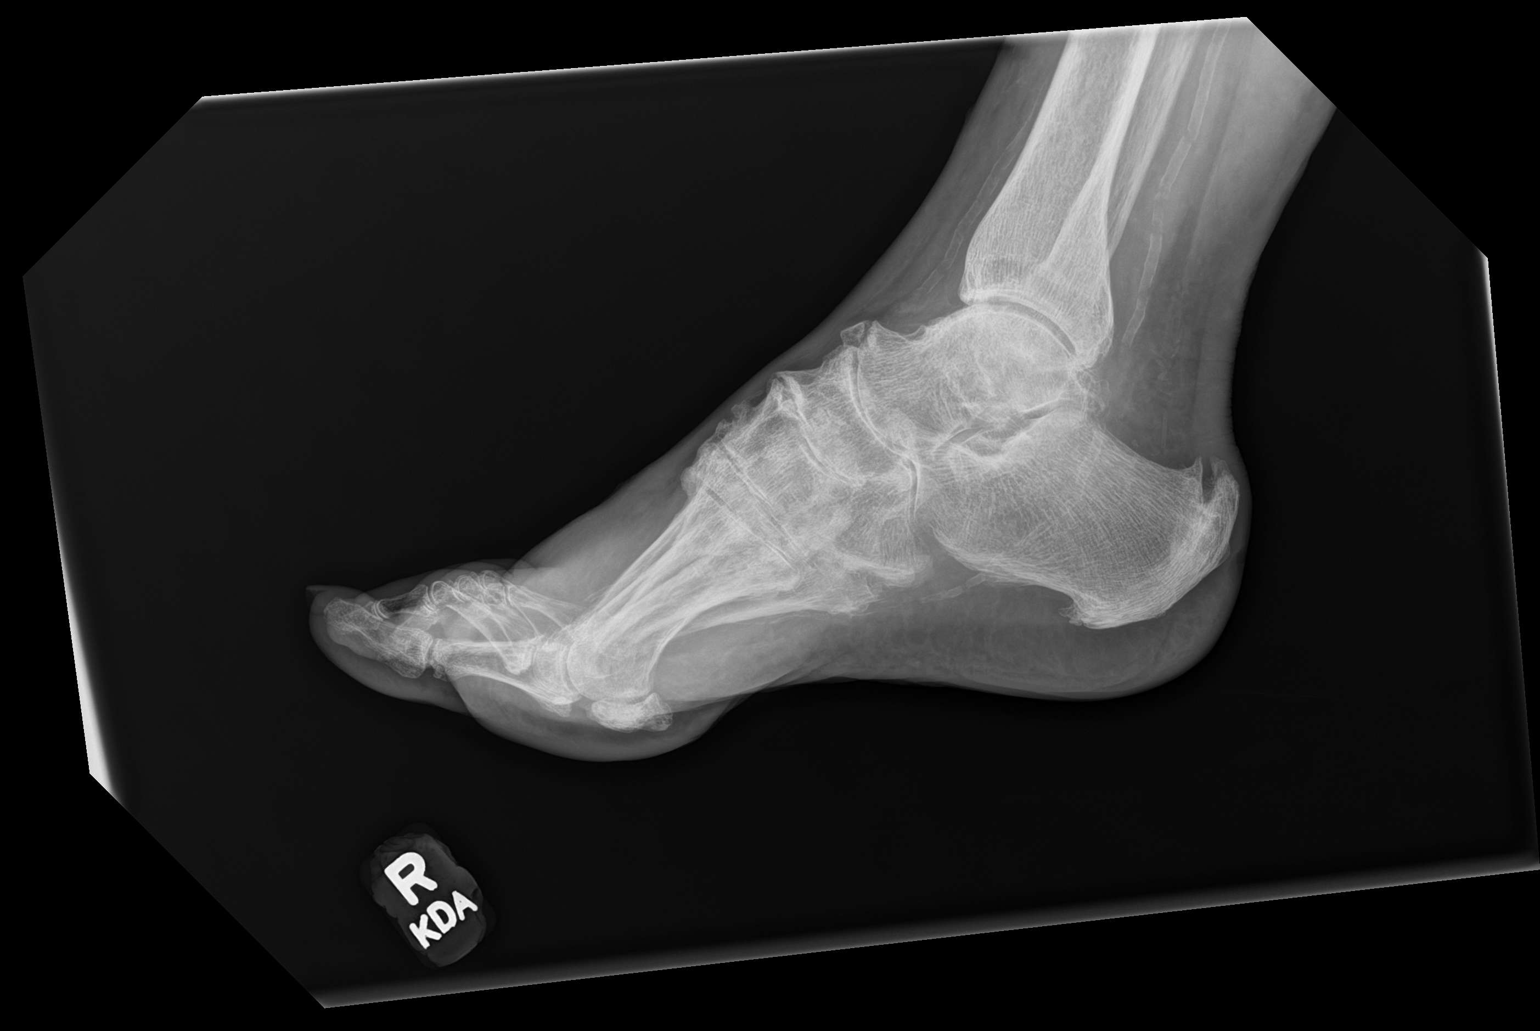

[3 of 3 positions shown; findings below may reference images not displayed]

FINDINGS: There is no evidence of fracture or dislocation. Vascular
calcifications are noted. Moderate posterior calcaneal spurring is
noted. Degenerative changes are seen involving the tarsal and
tarsometatarsal joints. No lytic destruction is seen to suggest
osteomyelitis.
IMPRESSION: Chronic findings as described above. No lytic destruction is seen to
suggest osteomyelitis.

## 2020-12-07 IMAGING — DX DG TIBIA/FIBULA 2V*R*
1 series · 4 of 4 positions shown · non-contrast
Comparison: None.

CLINICAL DATA: Leg and foot swelling with erythema. Concern for
fracture or osteomyelitis. Possible infection of venous stasis ulcer
to right leg.

EXAM:
RIGHT TIBIA AND FIBULA - 2 VIEW

[Series 1: leg · 0.14mm/px · 4 of 4 slices shown]
[im 1/4]
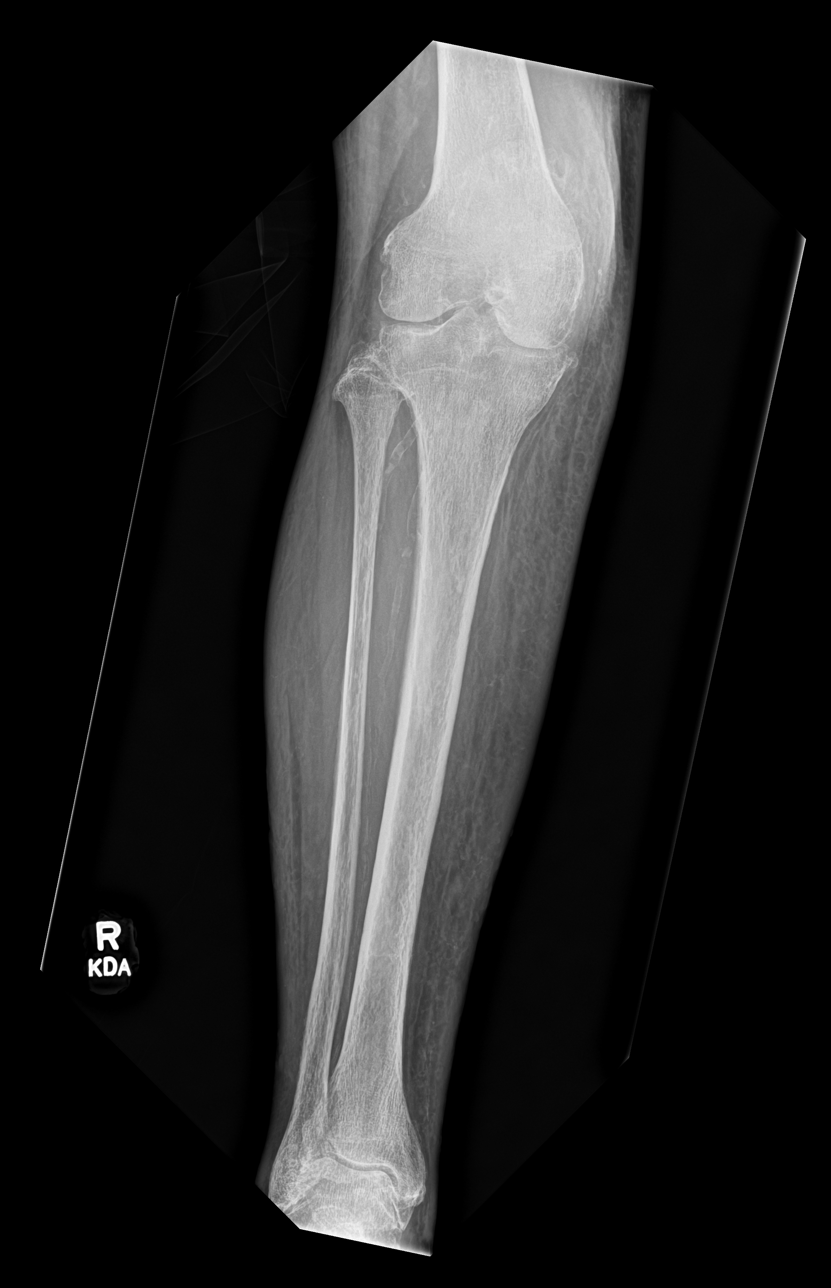
[im 2/4]
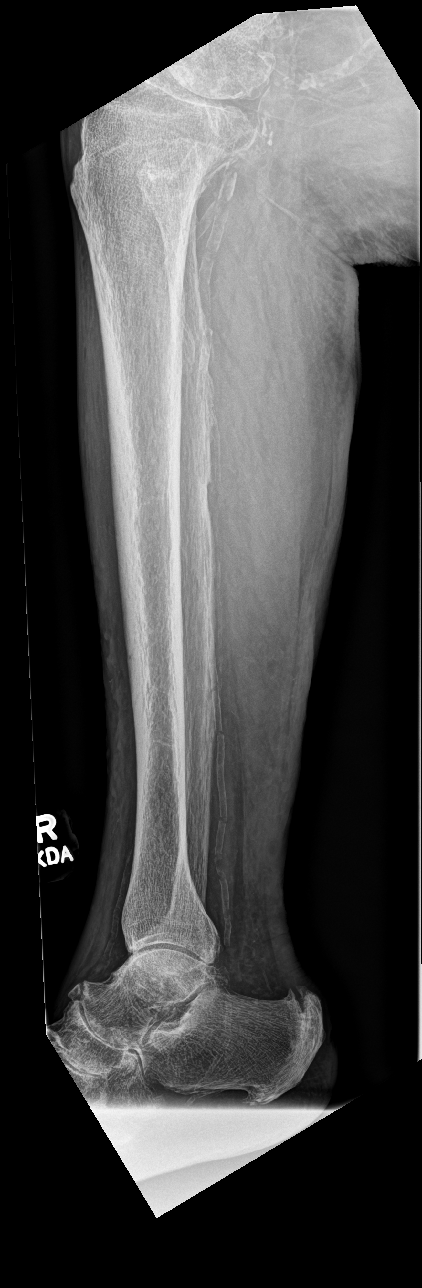
[im 3/4]
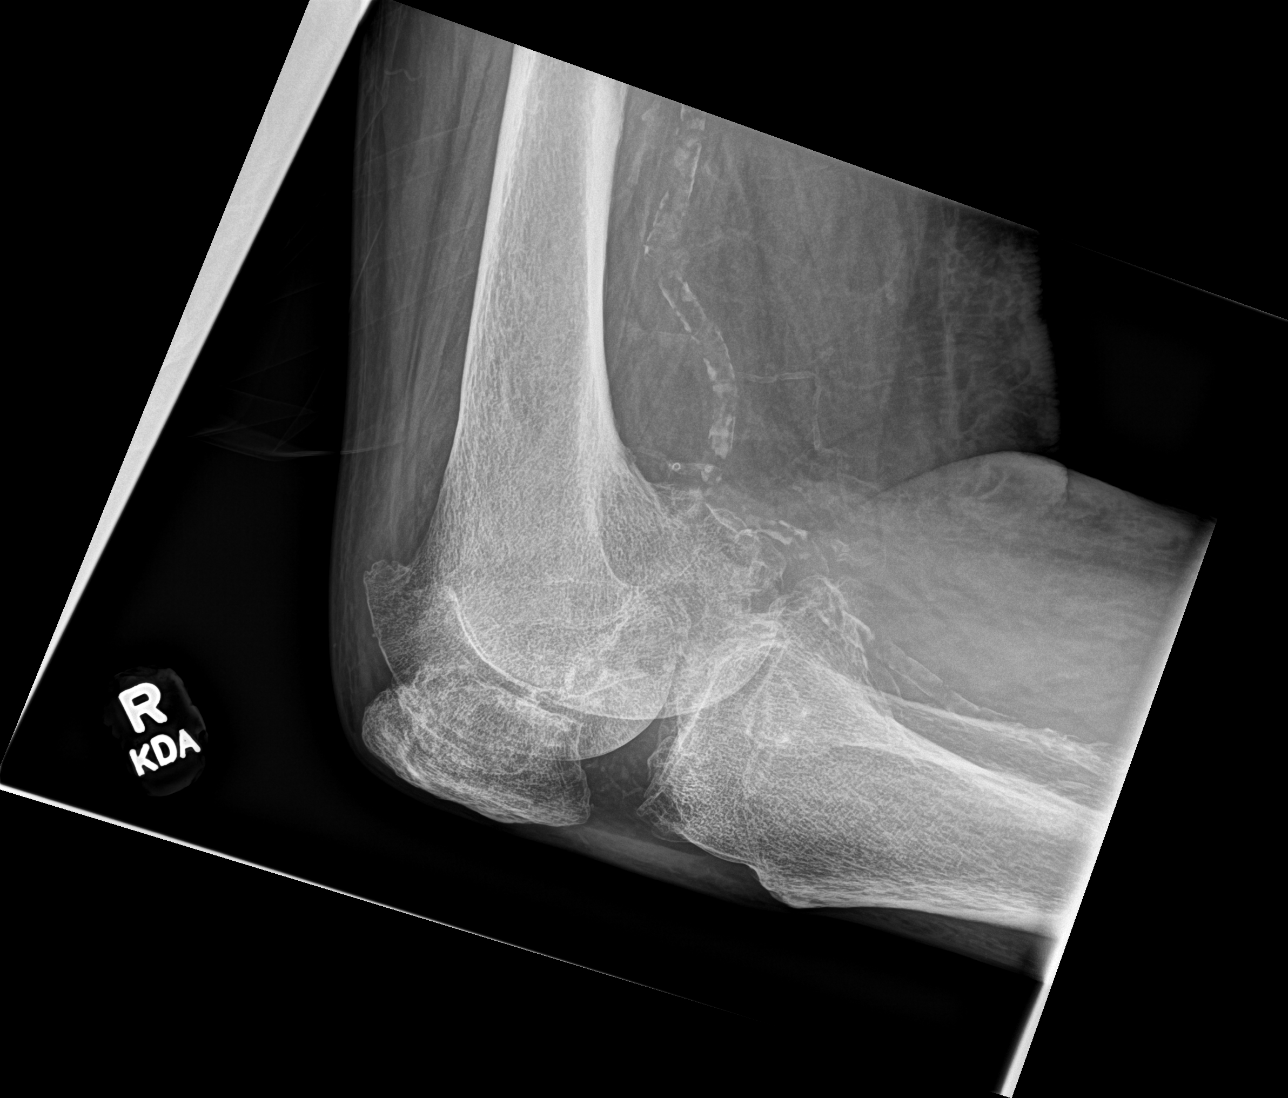
[im 4/4]
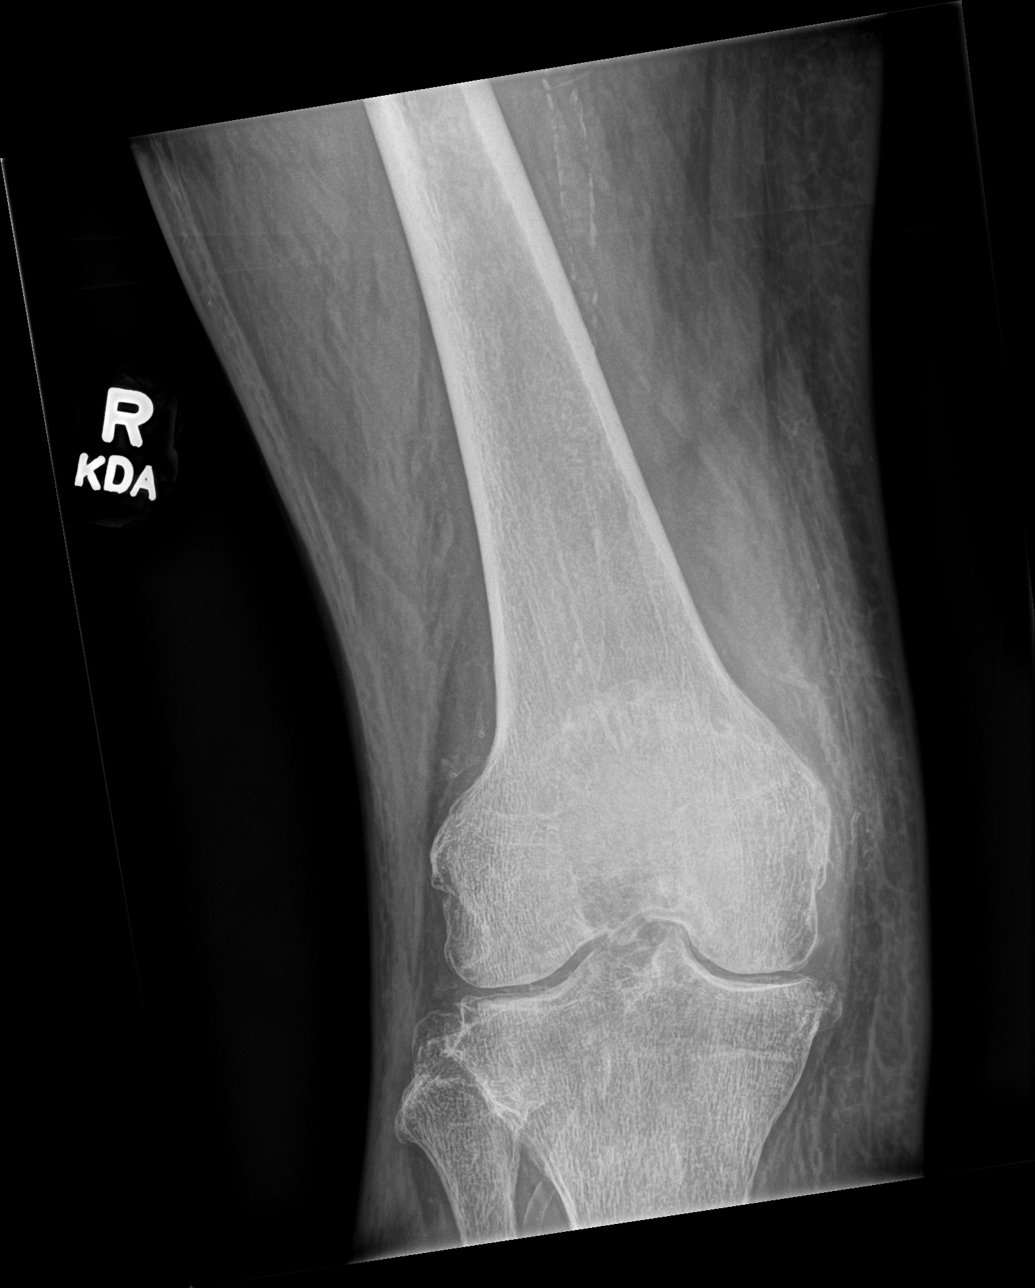

[4 of 4 positions shown; findings below may reference images not displayed]

FINDINGS: The bones are diffusely demineralized. There is no evidence of acute
fracture, dislocation or bone destruction. Tricompartmental
degenerative changes and meniscal chondrocalcinosis are noted at the
knee. There mild degenerative changes within the ankle and
visualized hindfoot. Diffuse vascular calcifications are noted. The
soft tissues appear edematous, without evidence of soft tissue
emphysema or foreign body.
IMPRESSION: No radiographic evidence of osteomyelitis. Nonspecific diffuse soft
tissue edema which could reflect cellulitis.

## 2020-12-07 MED ORDER — OXYCODONE HCL 5 MG PO TABS
5.0000 mg | ORAL_TABLET | Freq: Once | ORAL | Status: AC
  Administered 2020-12-07: 5 mg via ORAL
  Filled 2020-12-07: qty 1

## 2020-12-07 MED ORDER — ACETAMINOPHEN 500 MG PO TABS
1000.0000 mg | ORAL_TABLET | Freq: Once | ORAL | Status: AC
  Administered 2020-12-07: 1000 mg via ORAL
  Filled 2020-12-07: qty 2

## 2020-12-07 MED ORDER — CEPHALEXIN 500 MG PO CAPS: 500.0000 mg | ORAL_CAPSULE | Freq: Four times a day (QID) | ORAL | 0 refills | Status: AC

## 2020-12-07 MED ORDER — FENTANYL CITRATE PF 50 MCG/ML IJ SOSY
50.0000 ug | PREFILLED_SYRINGE | Freq: Once | INTRAMUSCULAR | Status: AC
Start: 2020-12-07 — End: 2020-12-07
  Administered 2020-12-07: 50 ug via INTRAVENOUS
  Filled 2020-12-07: qty 1

## 2020-12-07 MED ORDER — OXYCODONE-ACETAMINOPHEN 5-325 MG PO TABS: 1.0000 | ORAL_TABLET | Freq: Four times a day (QID) | ORAL | 0 refills | Status: AC | PRN

## 2020-12-07 NOTE — ED Provider Notes (Signed)
[facility] EMERGENCY DEPT Provider Note   CSN: [id number] Arrival date & time: 12/07/20  1147     History Chief Complaint  Patient presents with   Leg Swelling    Jacqueline Richmond is a 75 y.o. female.  Past medical history of diastolic heart failure, hypertension, diabetes, hyperlipidemia, chronic kidney disease presents to ER with concern for worsening of her lower extremity wounds.  States that these have been ongoing for the past couple months.  Seem to be steadily worsening.  Over the last couple weeks is also noted some increased redness over the right leg.  These have been followed by oral home health wound care nurse who has been performing twice weekly dressings.  Has had associated pain in both of her legs.  States it is constant, aching.  Does not feel that they have had any increase in the overall amount of swelling lately.  No fevers or chills.  No chest pain or difficulty in breathing new today.    HPI     Past Medical History:  Diagnosis Date   Allergy    Anemia    Arthritis    Cataract    biltaerally    CKD (chronic kidney disease) stage 3, GFR 30-59 ml/min ([place]) 08/29/2011   per patient, kidneys monitored by Dr Posey Pronto endocrinologist ,lov was  05-18-2018, "kidney fx stable"   Colon polyp    Complication of anesthesia    Difficult intubation    small mouth opening   Diverticulosis    Gout    HTN (hypertension) 08/29/2011   Hyperlipemia    Psoriasis    Type II or unspecified type diabetes mellitus without mention of complication, not stated as uncontrolled 08/29/2011    Patient Active Problem List   Diagnosis Date Noted   Wound dehiscence 08/30/2020   Multiple thyroid nodules 08/10/2020   Acute blood loss anemia 08/09/2020   Hemothorax on right 08/02/2020   T10 vertebral fracture ([place]) 07/31/2020   Rotator cuff arthropathy of both shoulders 04/15/2019   History of colonic polyps    Polyp of transverse colon    Diverticulosis of colon without  hemorrhage    Difficult intubation    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   Uncontrolled secondary diabetes mellitus with stage 3 CKD (GFR 30-59) ([place]) 08/29/2011   CKD (chronic kidney disease) stage 3, GFR 30-59 ml/min ([facility]) 08/29/2011   Acute renal failure superimposed on stage 3a chronic kidney disease ([place]) 08/29/2011   Hyperkalemia 08/29/2011   HTN (hypertension) 08/29/2011   Gout 08/29/2011    Past Surgical History:  Procedure Laterality Date   APPLICATION OF WOUND VAC N/A 08/30/2020   Procedure: APPLICATION OF WOUND VAC;  Surgeon: Consuella Lose, MD;  Location: [facility];  Service: Neurosurgery;  Laterality: N/A;   BREAST LUMPECTOMY WITH NEEDLE LOCALIZATION Right 05/07/2012   Procedure: BREAST LUMPECTOMY WITH NEEDLE LOCALIZATION;  Surgeon: Merrie Roof, MD;  Location: [facility];  Service: General;  Laterality: Right;   CHOLECYSTECTOMY     COLONOSCOPY  2009   [facility]    COLONOSCOPY WITH PROPOFOL N/A 05/20/2018   Procedure: COLONOSCOPY WITH PROPOFOL;  Surgeon: Milus Banister, MD;  Location: [facility];  Service: Endoscopy;  Laterality: N/A;   HERNIA REPAIR     HIP SURGERY     LAMINECTOMY WITH POSTERIOR LATERAL ARTHRODESIS LEVEL 4 N/A 08/01/2020   Procedure: LAMINECTOMY WITH POSTERIOR LATERAL ARTHRODESIS Thoracic nine- Thoracic eleven ;  Surgeon: Consuella Lose, MD;  Location: [facility];  Service: Neurosurgery;  Laterality: N/A;   POLYPECTOMY  05/20/2018   Procedure: POLYPECTOMY;  Surgeon: Milus Banister, MD;  Location: [facility];  Service: Endoscopy;;   RHINOPLASTY     SALPINGOOPHORECTOMY     TOTAL HIP ARTHROPLASTY Right 2004   WOUND EXPLORATION N/A 08/30/2020   Procedure: SUPERFICIALWOUND EXPLORATION, APPLY WOUND VAC;  Surgeon: Consuella Lose, MD;  Location: [facility];  Service: Neurosurgery;  Laterality: N/A;     OB History   No obstetric history on file.     Family History  Problem Relation Age of Onset   Heart disease Father    Arthritis Sister    Colon cancer Neg Hx     Colon polyps Neg Hx    Esophageal cancer Neg Hx    Stomach cancer Neg Hx    Rectal cancer Neg Hx     Social History   Tobacco Use   Smoking status: Never   Smokeless tobacco: Never  Vaping Use   Vaping Use: Never used  Substance Use Topics   Alcohol use: Yes    Comment: Drinks on Special Occasions   Drug use: No    Home Medications Prior to Admission medications   Medication Sig Start Date End Date Taking? Authorizing Provider  cephALEXin (KEFLEX) 500 MG capsule Take 1 capsule (500 mg total) by mouth 4 (four) times daily for 7 days. 12/07/20 12/14/20 Yes Yuleimy Kretz, Ellwood Dense, MD  oxyCODONE-acetaminophen (PERCOCET/ROXICET) 5-325 MG tablet Take 1 tablet by mouth every 6 (six) hours as needed for up to 3 days for severe pain. 12/07/20 12/10/20 Yes Dailyn Kempner, Ellwood Dense, MD  allopurinol (ZYLOPRIM) 100 MG tablet Take 0.5 tablets (50 mg total) by mouth every other day. TAKE 1/2 TABLET (50MG ) BY MOUTH EVERY OTHER DAY FOR GOUT 10/18/20   Medina-Vargas, Monina C, NP  apixaban (ELIQUIS) 2.5 MG TABS tablet Take 1 tablet (2.5 mg total) by mouth 2 (two) times daily. Elevated D-Dimer 10/18/20   Medina-Vargas, Monina C, NP  atenolol (TENORMIN) 100 MG tablet Take 1 tablet (100 mg total) by mouth in the morning. (0900) 10/18/20   Medina-Vargas, Monina C, NP  b complex vitamins tablet Take 1 tablet by mouth in the morning. (0900)    [provider]  bisacodyl (DULCOLAX) 10 MG suppository Place 10 mg rectally as needed for moderate constipation.    [provider]  cloNIDine (CATAPRES) 0.2 MG tablet Take 1 tablet (0.2 mg total) by mouth 2 (two) times daily. 10/18/20   Medina-Vargas, Monina C, NP  feeding supplement (ENSURE ENLIVE / ENSURE PLUS) LIQD Take 237 mLs by mouth 3 (three) times daily between meals. 09/03/20   Consuella Lose, MD  ferrous sulfate 325 (65 FE) MG tablet Take 1 tablet (325 mg total) by mouth daily with breakfast. (0900) 10/18/20   Medina-Vargas, Monina C, NP  furosemide  (LASIX) 40 MG tablet Take 1 tablet (40 mg total) by mouth in the morning. (0900) 10/18/20   Medina-Vargas, Monina C, NP  HYDROcodone-acetaminophen (NORCO/VICODIN) 5-325 MG tablet Take 2 tablets by mouth every 6 (six) hours as needed for moderate pain or severe pain. 10/18/20 10/18/21  Medina-Vargas, Monina C, NP  loratadine (CLARITIN) 10 MG tablet Take 1 tablet (10 mg total) by mouth in the morning. (0900) 10/18/20   Medina-Vargas, Monina C, NP  magnesium hydroxide (MILK OF MAGNESIA) 400 MG/5ML suspension Take 30 mLs by mouth daily as needed for mild constipation.    [provider]  Menthol, Topical Analgesic, (BIOFREEZE) 4 % GEL Apply 1 application topically as directed.  To right foot three times daily for pain    [provider]  mometasone (ELOCON) 0.1 % cream Apply 1 application topically daily as needed (psoriasis). 08/08/20   Lavina Hamman, MD  Multiple Vitamin (MULTIVITAMIN WITH MINERALS) TABS tablet Take 1 tablet by mouth daily. 09/03/20   Consuella Lose, MD  NON FORMULARY Diet: Heart Healthy, NAS, Regular textute Thin liquids    [provider]  Omega-3 1000 MG CAPS Take 1,000 mg by mouth 2 (two) times daily. (0900 & 2100)    [provider]  ondansetron (ZOFRAN) 4 MG tablet Take 4 mg by mouth every 8 (eight) hours as needed for nausea or vomiting.    [provider]  polyethylene glycol (MIRALAX / GLYCOLAX) 17 g packet Take 17 g by mouth daily. 10/18/20   Medina-Vargas, Monina C, NP  sitaGLIPtin (JANUVIA) 50 MG tablet Take 1 tablet (50 mg total) by mouth in the morning. (0900) 10/18/20   Medina-Vargas, Monina C, NP  Sodium Phosphates (RA SALINE ENEMA RE) Place 1 Dose rectally as needed. If not relieved by Biscodyl suppository, givedisposable Saline Enema rectally X 1 dose/24 hrs as needed    [provider]    Allergies    Codeine, Iodinated diagnostic agents, Pentazocine, and Statins  Review of Systems   Review of Systems   Constitutional:  Negative for chills and fever.  HENT:  Negative for ear pain and sore throat.   Eyes:  Negative for pain and visual disturbance.  Respiratory:  Negative for cough and shortness of breath.   Cardiovascular:  Negative for chest pain and palpitations.  Gastrointestinal:  Negative for abdominal pain and vomiting.  Genitourinary:  Negative for dysuria and hematuria.  Musculoskeletal:  Positive for arthralgias. Negative for back pain.  Skin:  Positive for color change, rash and wound.  Neurological:  Negative for seizures and syncope.  All other systems reviewed and are negative.  Physical Exam Updated Vital Signs BP (!) 177/112   Pulse 83   Temp 97.7 F (36.5 C)   Resp 20   SpO2 100%   Physical Exam Vitals and nursing note reviewed.  Constitutional:      General: She is not in acute distress.    Appearance: She is well-developed.  HENT:     Head: Normocephalic and atraumatic.  Eyes:     Conjunctiva/sclera: Conjunctivae normal.  Cardiovascular:     Rate and Rhythm: Normal rate and regular rhythm.     Heart sounds: No murmur heard. Pulmonary:     Effort: Pulmonary effort is normal. No respiratory distress.     Breath sounds: Normal breath sounds.  Abdominal:     Palpations: Abdomen is soft.     Tenderness: There is no abdominal tenderness.  Musculoskeletal:     Cervical back: Neck supple.     Comments: Patient has bilateral lower leg chronic venous stasis, mild edema bilaterally, there is mild erythema, blanchable over the right lower leg and foot; DP pulses intact, sensation intact, distal motor intact  Skin:    General: Skin is warm and dry.  Neurological:     Mental Status: She is alert.  Psychiatric:        Mood and Affect: Mood normal.   Media Information Document Information  Photos  Lower legs  12/07/2020 12:57  Attached To:  Hospital Encounter on 12/07/20   Source Information  Aynslee Mulhall, Ellwood Dense, MD  [facility]      ED  Results / Procedures / Treatments  Labs (all labs ordered are listed, but only abnormal results are displayed) Labs Reviewed  CBC WITH DIFFERENTIAL/PLATELET - Abnormal; Notable for the following components:      Result Value   WBC 10.7 (*)    Hemoglobin 11.3 (*)    MCV 79.0 (*)    MCH 24.2 (*)    RDW 17.0 (*)    Neutro Abs 8.4 (*)    All other components within normal limits  BASIC METABOLIC PANEL - Abnormal; Notable for the following components:   Glucose, Bld 111 (*)    BUN 64 (*)    Creatinine, Ser 2.01 (*)    GFR, Estimated 26 (*)    All other components within normal limits  BRAIN NATRIURETIC PEPTIDE - Abnormal; Notable for the following components:   B Natriuretic Peptide 126.4 (*)    All other components within normal limits  CULTURE, BLOOD (SINGLE)    EKG None  Radiology DG Tibia/Fibula Right  Result Date: 12/07/2020 CLINICAL DATA:  Leg and foot swelling with erythema. Concern for fracture or osteomyelitis. Possible infection of venous stasis ulcer to right leg. EXAM: RIGHT TIBIA AND FIBULA - 2 VIEW COMPARISON:  None. FINDINGS: The bones are diffusely demineralized. There is no evidence of acute fracture, dislocation or bone destruction. Tricompartmental degenerative changes and meniscal chondrocalcinosis are noted at the knee. There mild degenerative changes within the ankle and visualized hindfoot. Diffuse vascular calcifications are noted. The soft tissues appear edematous, without evidence of soft tissue emphysema or foreign body. IMPRESSION: No radiographic evidence of osteomyelitis. Nonspecific diffuse soft tissue edema which could reflect cellulitis. Electronically Signed   By: Richardean Sale M.D.   On: 12/07/2020 13:52   DG Foot Complete Right  Result Date: 12/07/2020 CLINICAL DATA:  Right lower extremity swelling. EXAM: RIGHT FOOT COMPLETE - 3+ VIEW COMPARISON:  None. FINDINGS: There is no evidence of fracture or dislocation. Vascular calcifications are noted.  Moderate posterior calcaneal spurring is noted. Degenerative changes are seen involving the tarsal and tarsometatarsal joints. No lytic destruction is seen to suggest osteomyelitis. IMPRESSION: Chronic findings as described above. No lytic destruction is seen to suggest osteomyelitis. Electronically Signed   By: Marijo Conception M.D.   On: 12/07/2020 13:51    Procedures Procedures   Medications Ordered in ED Medications  fentaNYL (SUBLIMAZE) injection 50 mcg (50 mcg Intravenous Given 12/07/20 1336)  oxyCODONE (Oxy IR/ROXICODONE) immediate release tablet 5 mg (5 mg Oral Given 12/07/20 1700)  acetaminophen (TYLENOL) tablet 1,000 mg (1,000 mg Oral Given 12/07/20 1700)    ED Course  I have reviewed the triage vital signs and the nursing notes.  Pertinent labs & imaging results that were available during my care of the patient were reviewed by me and considered in my medical decision making (see chart for details).    MDM Rules/Calculators/A&P                           75 year old lady with diastolic heart failure, hypertension, diabetes, hyperlipidemia, chronic kidney disease presented to ER with concern for worsening of her lower extremity wounds.  Wounds ongoing for the past couple months.  On physical exam her wounds appear chronic in nature.  Suspect most likely related to venous stasis or chronic edema.  Neurovascularly intact.  The right lower lower leg had slightly more erythema when compared to the left.  Check basic blood work.  WBC 10.7, barely above upper limit of normal, creatinine near baseline CKD.  BNP not significantly elevated.   Patient denied any new respiratory complaint.  Screening x-rays were fine. Out of an abundance of precaution, will give course of oral antibiotics to cover for possible superimposed skin infection.  Believe patient would benefit from referral to wound care center.  Placed ambulatory referral order and instructed family to contact their clinic.  Additionally  recommended close follow-up with primary doctor.  Recommended continuing with current wound care home health nurse.  Discussed return precautions with patient and family at bedside.  Demonstrated good understanding, discharged home.    After the discussed management above, the patient was determined to be safe for discharge.  The patient was in agreement with this plan and all questions regarding their care were answered.  ED return precautions were discussed and the patient will return to the ED with any significant worsening of condition.  Final Clinical Impression(s) / ED Diagnoses Final diagnoses:  Chronic venous stasis  Wound of right lower extremity, initial encounter    Rx / DC Orders ED Discharge Orders          Ordered    AMB referral to wound care center        12/07/20 1509    cephALEXin (KEFLEX) 500 MG capsule  4 times daily        12/07/20 1510    oxyCODONE-acetaminophen (PERCOCET/ROXICET) 5-325 MG tablet  Every 6 hours PRN        12/07/20 1511             Lucrezia Starch, MD 12/08/20 [phone]

## 2020-12-07 NOTE — ED Triage Notes (Signed)
Patient has possible infection of venus stasis ulcer to right leg? (Patient unsure), having dressing changes done by home health twice weekly.  Dressing has some serous drainage on it, left leg also has a dressing and appears to be OK.

## 2020-12-07 NOTE — Discharge Instructions (Addendum)
Take antibiotic as prescribed.  For pain recommend Tylenol as needed or Motrin as needed.  For breakthrough pain can take the prescribed Percocet.  Note this does contain some Tylenol and can make you drowsy and not be taken while driving or operating heavy machinery.  Please follow-up both with your primary care doctor as well as with the wound care center.  If you develop a fever, worsening redness, swelling, please come back to ER for reassessment.  For now, continue dressing changes with your wound care nurse.

## 2020-12-10 ENCOUNTER — Telehealth: Payer: Self-pay | Admitting: Pulmonary Disease

## 2020-12-10 NOTE — Telephone Encounter (Signed)
Go back to 1 tab, kidney function was slightly elevated.

## 2020-12-10 NOTE — Telephone Encounter (Signed)
Called and spoke with pt's daughter-n-law [name] who stated pt was just seen at the ED Friday, 9/16 due to wound on leg. [name] said while pt was there, labwork was done that she wants Dr. Silas Flood to review.  Pt is currently on an abx to help with the leg infection. [name] said that pt told her that she was currently taking 1.5tabs of the lasix and wants to know if that is where pt needs to stay or if pt can go back to taking one tab.  Dr. Silas Flood, please advise.

## 2020-12-10 NOTE — Telephone Encounter (Signed)
Spoke with [name] and notified of response per [place]  She verbalized understanding  Nothing further needed

## 2020-12-10 NOTE — Telephone Encounter (Signed)
Pt daughter-in-law [name], calling on behalf of pt- pt went to ER 12/07/20. Pt had labs, wanting to make sure MH saw the labs. Also wanting to make sure furosemide was supposed to stay at 1.5 instead of 1 tab. Lasix is working, pt is voiding and legs less swollen. Pt also on abx for leg infection.Please advise [phone]

## 2020-12-12 LAB — CULTURE, BLOOD (SINGLE)
Culture: NO GROWTH
Special Requests: ADEQUATE

## 2020-12-18 ENCOUNTER — Ambulatory Visit: Payer: Medicare PPO | Admitting: Podiatry

## 2020-12-18 ENCOUNTER — Encounter (HOSPITAL_BASED_OUTPATIENT_CLINIC_OR_DEPARTMENT_OTHER): Payer: Self-pay | Admitting: *Deleted

## 2020-12-18 ENCOUNTER — Inpatient Hospital Stay (HOSPITAL_BASED_OUTPATIENT_CLINIC_OR_DEPARTMENT_OTHER)
Admission: EM | Admit: 2020-12-18 | Discharge: 2021-01-03 | DRG: 299 | Disposition: A | Discharge status: Skilled Nursing Facility | Payer: Medicare Other | Source: Ambulatory Visit | Attending: Family Medicine | Admitting: Family Medicine

## 2020-12-18 ENCOUNTER — Other Ambulatory Visit: Payer: Self-pay

## 2020-12-18 ENCOUNTER — Emergency Department (HOSPITAL_BASED_OUTPATIENT_CLINIC_OR_DEPARTMENT_OTHER): Payer: Medicare Other

## 2020-12-18 DIAGNOSIS — Z8261 Family history of arthritis: Secondary | ICD-10-CM

## 2020-12-18 DIAGNOSIS — L89106 Pressure-induced deep tissue damage of unspecified part of back: Secondary | ICD-10-CM | POA: Diagnosis not present

## 2020-12-18 DIAGNOSIS — I700 Atherosclerosis of aorta: Secondary | ICD-10-CM | POA: Diagnosis not present

## 2020-12-18 DIAGNOSIS — L03116 Cellulitis of left lower limb: Secondary | ICD-10-CM | POA: Diagnosis present

## 2020-12-18 DIAGNOSIS — I739 Peripheral vascular disease, unspecified: Secondary | ICD-10-CM

## 2020-12-18 DIAGNOSIS — Z91041 Radiographic dye allergy status: Secondary | ICD-10-CM

## 2020-12-18 DIAGNOSIS — L97412 Non-pressure chronic ulcer of right heel and midfoot with fat layer exposed: Secondary | ICD-10-CM | POA: Diagnosis not present

## 2020-12-18 DIAGNOSIS — M1990 Unspecified osteoarthritis, unspecified site: Secondary | ICD-10-CM | POA: Diagnosis not present

## 2020-12-18 DIAGNOSIS — L03115 Cellulitis of right lower limb: Secondary | ICD-10-CM

## 2020-12-18 DIAGNOSIS — E1151 Type 2 diabetes mellitus with diabetic peripheral angiopathy without gangrene: Secondary | ICD-10-CM | POA: Diagnosis not present

## 2020-12-18 DIAGNOSIS — I951 Orthostatic hypotension: Secondary | ICD-10-CM | POA: Diagnosis not present

## 2020-12-18 DIAGNOSIS — E785 Hyperlipidemia, unspecified: Secondary | ICD-10-CM | POA: Diagnosis present

## 2020-12-18 DIAGNOSIS — Z23 Encounter for immunization: Secondary | ICD-10-CM | POA: Diagnosis not present

## 2020-12-18 DIAGNOSIS — I872 Venous insufficiency (chronic) (peripheral): Secondary | ICD-10-CM | POA: Diagnosis present

## 2020-12-18 DIAGNOSIS — I129 Hypertensive chronic kidney disease with stage 1 through stage 4 chronic kidney disease, or unspecified chronic kidney disease: Secondary | ICD-10-CM | POA: Diagnosis not present

## 2020-12-18 DIAGNOSIS — Z888 Allergy status to other drugs, medicaments and biological substances status: Secondary | ICD-10-CM

## 2020-12-18 DIAGNOSIS — Z9049 Acquired absence of other specified parts of digestive tract: Secondary | ICD-10-CM

## 2020-12-18 DIAGNOSIS — E118 Type 2 diabetes mellitus with unspecified complications: Secondary | ICD-10-CM | POA: Diagnosis present

## 2020-12-18 DIAGNOSIS — G9341 Metabolic encephalopathy: Secondary | ICD-10-CM | POA: Diagnosis not present

## 2020-12-18 DIAGNOSIS — N179 Acute kidney failure, unspecified: Secondary | ICD-10-CM | POA: Diagnosis present

## 2020-12-18 DIAGNOSIS — Z96641 Presence of right artificial hip joint: Secondary | ICD-10-CM | POA: Diagnosis present

## 2020-12-18 DIAGNOSIS — E872 Acidosis, unspecified: Secondary | ICD-10-CM | POA: Diagnosis present

## 2020-12-18 DIAGNOSIS — Z7982 Long term (current) use of aspirin: Secondary | ICD-10-CM

## 2020-12-18 DIAGNOSIS — L89616 Pressure-induced deep tissue damage of right heel: Secondary | ICD-10-CM | POA: Diagnosis present

## 2020-12-18 DIAGNOSIS — N184 Chronic kidney disease, stage 4 (severe): Secondary | ICD-10-CM | POA: Diagnosis present

## 2020-12-18 DIAGNOSIS — D539 Nutritional anemia, unspecified: Secondary | ICD-10-CM | POA: Diagnosis present

## 2020-12-18 DIAGNOSIS — E1122 Type 2 diabetes mellitus with diabetic chronic kidney disease: Secondary | ICD-10-CM | POA: Diagnosis present

## 2020-12-18 DIAGNOSIS — Z981 Arthrodesis status: Secondary | ICD-10-CM

## 2020-12-18 DIAGNOSIS — E875 Hyperkalemia: Secondary | ICD-10-CM | POA: Diagnosis present

## 2020-12-18 DIAGNOSIS — M109 Gout, unspecified: Secondary | ICD-10-CM | POA: Diagnosis not present

## 2020-12-18 DIAGNOSIS — L97929 Non-pressure chronic ulcer of unspecified part of left lower leg with unspecified severity: Secondary | ICD-10-CM | POA: Diagnosis not present

## 2020-12-18 DIAGNOSIS — Z8249 Family history of ischemic heart disease and other diseases of the circulatory system: Secondary | ICD-10-CM

## 2020-12-18 DIAGNOSIS — Z885 Allergy status to narcotic agent status: Secondary | ICD-10-CM

## 2020-12-18 DIAGNOSIS — Z87820 Personal history of traumatic brain injury: Secondary | ICD-10-CM

## 2020-12-18 DIAGNOSIS — D631 Anemia in chronic kidney disease: Secondary | ICD-10-CM | POA: Diagnosis not present

## 2020-12-18 DIAGNOSIS — Z20822 Contact with and (suspected) exposure to covid-19: Secondary | ICD-10-CM | POA: Diagnosis present

## 2020-12-18 DIAGNOSIS — S81801A Unspecified open wound, right lower leg, initial encounter: Secondary | ICD-10-CM | POA: Diagnosis not present

## 2020-12-18 DIAGNOSIS — Z79899 Other long term (current) drug therapy: Secondary | ICD-10-CM

## 2020-12-18 DIAGNOSIS — Z7901 Long term (current) use of anticoagulants: Secondary | ICD-10-CM

## 2020-12-18 DIAGNOSIS — S81801D Unspecified open wound, right lower leg, subsequent encounter: Secondary | ICD-10-CM | POA: Diagnosis not present

## 2020-12-18 DIAGNOSIS — L0390 Cellulitis, unspecified: Secondary | ICD-10-CM | POA: Diagnosis not present

## 2020-12-18 DIAGNOSIS — Z7984 Long term (current) use of oral hypoglycemic drugs: Secondary | ICD-10-CM

## 2020-12-18 LAB — CBC
HCT: 33.9 % — ABNORMAL LOW (ref 36.0–46.0)
Hemoglobin: 10.4 g/dL — ABNORMAL LOW (ref 12.0–15.0)
MCH: 24.1 pg — ABNORMAL LOW (ref 26.0–34.0)
MCHC: 30.7 g/dL (ref 30.0–36.0)
MCV: 78.7 fL — ABNORMAL LOW (ref 80.0–100.0)
Platelets: 313 10*3/uL (ref 150–400)
RBC: 4.31 MIL/uL (ref 3.87–5.11)
RDW: 17 % — ABNORMAL HIGH (ref 11.5–15.5)
WBC: 10.1 10*3/uL (ref 4.0–10.5)
nRBC: 0 % (ref 0.0–0.2)

## 2020-12-18 LAB — COMPREHENSIVE METABOLIC PANEL
ALT: 7 U/L (ref 0–44)
AST: 10 U/L — ABNORMAL LOW (ref 15–41)
Albumin: 3.5 g/dL (ref 3.5–5.0)
Alkaline Phosphatase: 99 U/L (ref 38–126)
Anion gap: 11 (ref 5–15)
BUN: 106 mg/dL — ABNORMAL HIGH (ref 8–23)
CO2: 20 mmol/L — ABNORMAL LOW (ref 22–32)
Calcium: 10.1 mg/dL (ref 8.9–10.3)
Chloride: 106 mmol/L (ref 98–111)
Creatinine, Ser: 3.5 mg/dL — ABNORMAL HIGH (ref 0.44–1.00)
GFR, Estimated: 13 mL/min — ABNORMAL LOW (ref >60)
Glucose, Bld: 157 mg/dL — ABNORMAL HIGH (ref 70–99)
Potassium: 5.9 mmol/L — ABNORMAL HIGH (ref 3.5–5.1)
Sodium: 137 mmol/L (ref 135–145)
Total Bilirubin: 0.3 mg/dL (ref 0.3–1.2)
Total Protein: 7.7 g/dL (ref 6.5–8.1)

## 2020-12-18 LAB — GLUCOSE, CAPILLARY: Glucose-Capillary: 124 mg/dL — ABNORMAL HIGH (ref 70–99)

## 2020-12-18 LAB — LACTIC ACID, PLASMA: Lactic Acid, Venous: 1.1 mmol/L (ref 0.5–1.9)

## 2020-12-18 LAB — RESP PANEL BY RT-PCR (FLU A&B, COVID) ARPGX2
Influenza A by PCR: NEGATIVE
Influenza B by PCR: NEGATIVE
SARS Coronavirus 2 by RT PCR: NEGATIVE

## 2020-12-18 IMAGING — DX DG CHEST 1V PORT
1 series · 1 of 1 positions shown · non-contrast
Comparison: Chest x-ray [DATE], CT abdomen pelvis [DATE],
CT chest [DATE]

CLINICAL DATA: Weakness.  History of chest trauma in [DATE].

EXAM:
PORTABLE CHEST 1 VIEW

[chest]
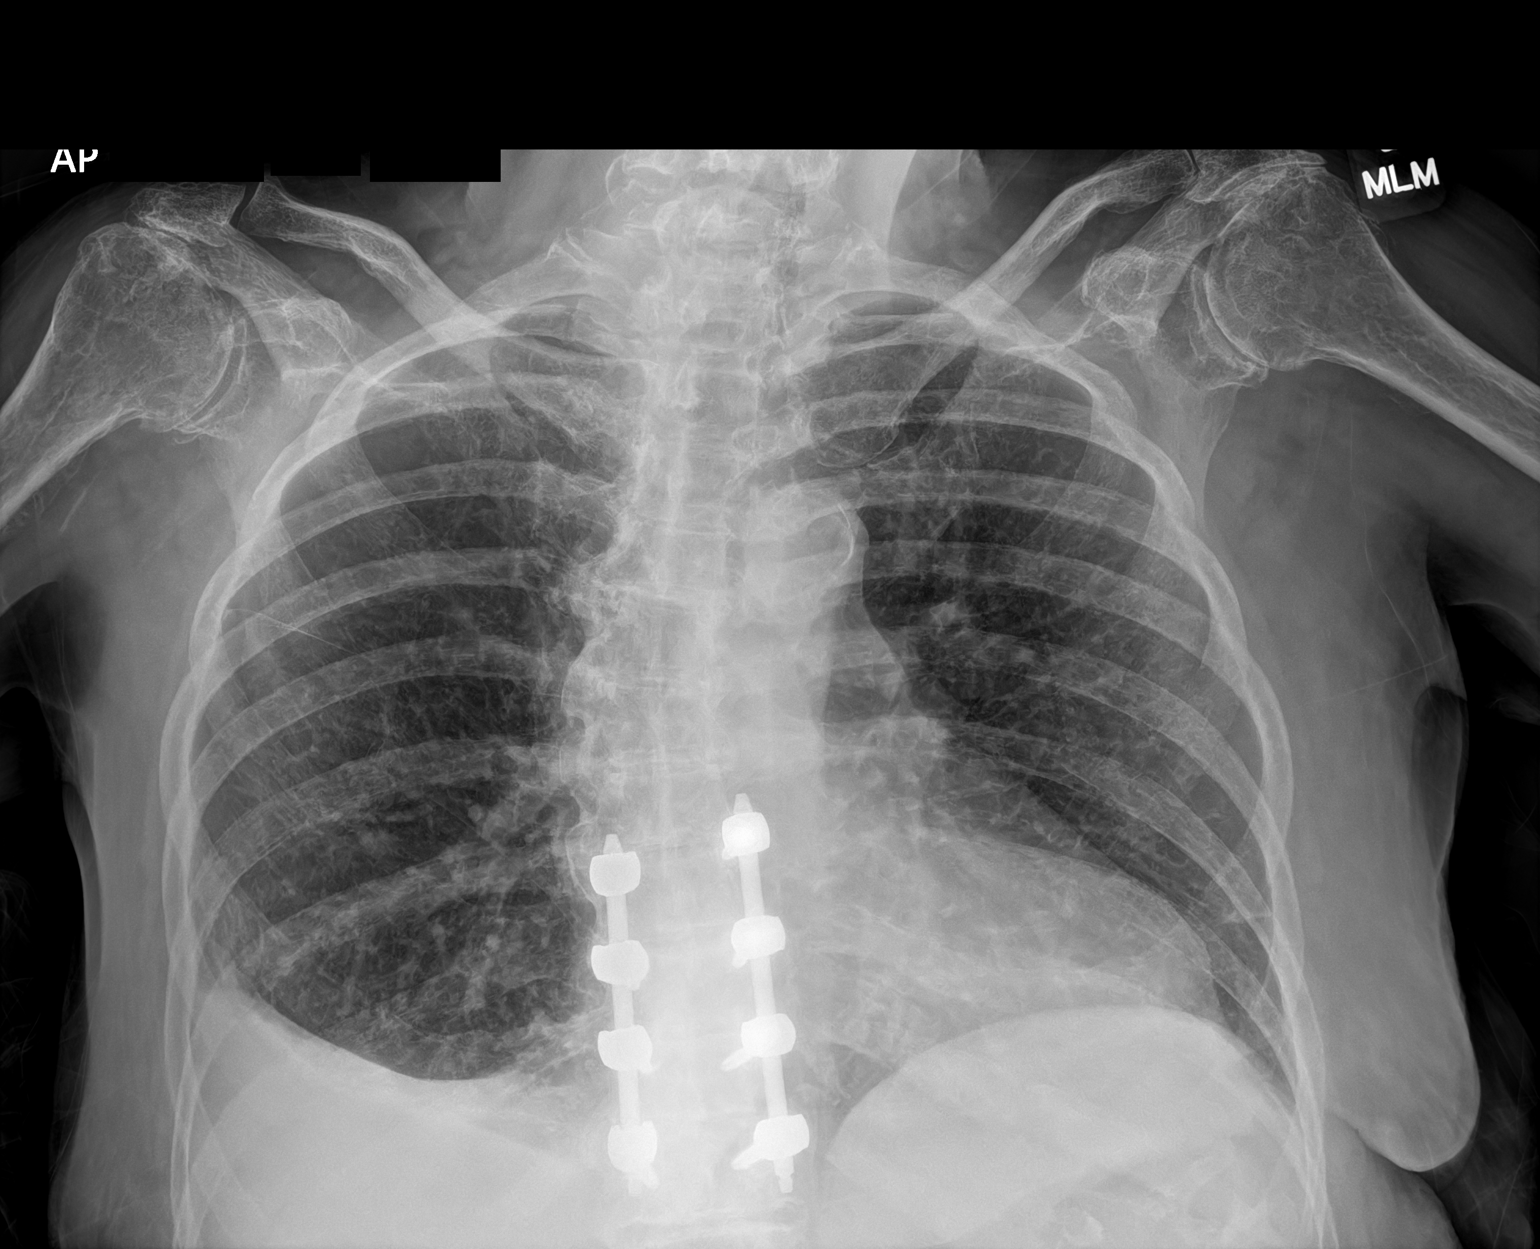

[1 of 1 positions shown; findings below may reference images not displayed]

FINDINGS: The heart and mediastinal contours are unchanged. Aortic
calcification.

No focal consolidation. No pulmonary edema. Interval decrease in
size of a trace to small volume right pleural effusion. No
pneumothorax.

No acute osseous abnormality. Bilateral shoulder degenerative
changes. Surgical hardware of the lower thoracic spine is again
noted.
IMPRESSION: 1. Interval decrease in size of a persistent trace to small volume
right pleural effusion.
2.  Aortic Atherosclerosis ([F0]-[F0]).

## 2020-12-18 MED ORDER — HEPARIN SODIUM (PORCINE) 5000 UNIT/ML IJ SOLN
5000.0000 [IU] | Freq: Three times a day (TID) | INTRAMUSCULAR | Status: DC
  Administered 2020-12-18 – 2020-12-25 (×20): 5000 [IU] via SUBCUTANEOUS
  Filled 2020-12-18 (×17): qty 1

## 2020-12-18 MED ORDER — ONDANSETRON HCL 4 MG/2ML IJ SOLN: 4.0000 mg | Freq: Once | INTRAMUSCULAR | Status: AC

## 2020-12-18 MED ORDER — SODIUM CHLORIDE 0.9 % IV SOLN
INTRAVENOUS | Status: DC | PRN
  Administered 2020-12-18: 250 mL via INTRAVENOUS

## 2020-12-18 MED ORDER — ATENOLOL 50 MG PO TABS
100.0000 mg | ORAL_TABLET | Freq: Every day | ORAL | Status: DC
  Administered 2020-12-19 – 2021-01-03 (×16): 100 mg via ORAL
  Filled 2020-12-18: qty 2
  Filled 2020-12-18: qty 4
  Filled 2020-12-18 (×14): qty 2

## 2020-12-18 MED ORDER — MORPHINE SULFATE (PF) 4 MG/ML IV SOLN: 4.0000 mg | Freq: Once | INTRAVENOUS | Status: AC

## 2020-12-18 MED ORDER — MORPHINE SULFATE (PF) 4 MG/ML IV SOLN
INTRAVENOUS | Status: AC
  Administered 2020-12-18: 4 mg via INTRAVENOUS
  Filled 2020-12-18: qty 1

## 2020-12-18 MED ORDER — INSULIN ASPART 100 UNIT/ML IV SOLN
6.0000 [IU] | Freq: Once | INTRAVENOUS | Status: AC
  Administered 2020-12-18: 6 [IU] via INTRAVENOUS

## 2020-12-18 MED ORDER — SENNA 8.6 MG PO TABS
1.0000 | ORAL_TABLET | Freq: Two times a day (BID) | ORAL | Status: DC
  Administered 2020-12-19 – 2021-01-02 (×22): 8.6 mg via ORAL
  Filled 2020-12-18 (×31): qty 1

## 2020-12-18 MED ORDER — CALCIUM GLUCONATE 10 % IV SOLN
1.0000 g | Freq: Once | INTRAVENOUS | Status: AC
  Administered 2020-12-18: 1 g via INTRAVENOUS
  Filled 2020-12-18: qty 10

## 2020-12-18 MED ORDER — ALBUTEROL SULFATE (2.5 MG/3ML) 0.083% IN NEBU
2.5000 mg | INHALATION_SOLUTION | Freq: Four times a day (QID) | RESPIRATORY_TRACT | Status: DC
  Administered 2020-12-18: 2.5 mg via RESPIRATORY_TRACT
  Filled 2020-12-18: qty 3

## 2020-12-18 MED ORDER — HYDRALAZINE HCL 20 MG/ML IJ SOLN
10.0000 mg | Freq: Four times a day (QID) | INTRAMUSCULAR | Status: DC | PRN
Start: 2020-12-18 — End: 2020-12-26

## 2020-12-18 MED ORDER — SODIUM ZIRCONIUM CYCLOSILICATE 5 G PO PACK
5.0000 g | PACK | Freq: Once | ORAL | Status: AC
  Administered 2020-12-18: 5 g via ORAL
  Filled 2020-12-18: qty 1

## 2020-12-18 MED ORDER — VANCOMYCIN VARIABLE DOSE PER UNSTABLE RENAL FUNCTION (PHARMACIST DOSING)
Status: DC
  Filled 2020-12-18: qty 1

## 2020-12-18 MED ORDER — LACTATED RINGERS IV BOLUS
1000.0000 mL | Freq: Once | INTRAVENOUS | Status: AC
  Administered 2020-12-18: 1000 mL via INTRAVENOUS

## 2020-12-18 MED ORDER — CLONIDINE HCL 0.2 MG PO TABS
0.2000 mg | ORAL_TABLET | Freq: Two times a day (BID) | ORAL | Status: DC
  Administered 2020-12-18 – 2021-01-01 (×28): 0.2 mg via ORAL
  Filled 2020-12-18 (×9): qty 1
  Filled 2020-12-18: qty 2
  Filled 2020-12-18 (×2): qty 1
  Filled 2020-12-18: qty 2
  Filled 2020-12-18 (×8): qty 1
  Filled 2020-12-18: qty 2
  Filled 2020-12-18 (×6): qty 1

## 2020-12-18 MED ORDER — VANCOMYCIN HCL IN DEXTROSE 1-5 GM/200ML-% IV SOLN
1000.0000 mg | Freq: Once | INTRAVENOUS | Status: AC
  Administered 2020-12-18: 1000 mg via INTRAVENOUS
  Filled 2020-12-18: qty 200

## 2020-12-18 MED ORDER — INSULIN ASPART 100 UNIT/ML IJ SOLN
0.0000 [IU] | Freq: Three times a day (TID) | INTRAMUSCULAR | Status: DC
  Administered 2020-12-19 – 2020-12-25 (×6): 1 [IU] via SUBCUTANEOUS
  Administered 2020-12-26: 3 [IU] via SUBCUTANEOUS
  Administered 2020-12-27 – 2021-01-01 (×5): 1 [IU] via SUBCUTANEOUS

## 2020-12-18 MED ORDER — PIPERACILLIN-TAZOBACTAM IN DEX 2-0.25 GM/50ML IV SOLN
2.2500 g | Freq: Three times a day (TID) | INTRAVENOUS | Status: DC
  Administered 2020-12-18 – 2020-12-19 (×2): 2.25 g via INTRAVENOUS
  Filled 2020-12-18 (×2): qty 50

## 2020-12-18 MED ORDER — DEXTROSE 50 % IV SOLN
25.0000 g | Freq: Once | INTRAVENOUS | Status: AC
  Administered 2020-12-18: 25 g via INTRAVENOUS
  Filled 2020-12-18: qty 50

## 2020-12-18 MED ORDER — SODIUM BICARBONATE 8.4 % IV SOLN
50.0000 meq | Freq: Once | INTRAVENOUS | Status: AC
  Administered 2020-12-18: 50 meq via INTRAVENOUS
  Filled 2020-12-18: qty 50

## 2020-12-18 MED ORDER — ACETAMINOPHEN 650 MG RE SUPP: 650.0000 mg | Freq: Four times a day (QID) | RECTAL | Status: DC | PRN

## 2020-12-18 MED ORDER — ONDANSETRON HCL 4 MG/2ML IJ SOLN
INTRAMUSCULAR | Status: AC
  Administered 2020-12-18: 4 mg via INTRAVENOUS
  Filled 2020-12-18: qty 2

## 2020-12-18 MED ORDER — ASPIRIN EC 81 MG PO TBEC
81.0000 mg | DELAYED_RELEASE_TABLET | Freq: Every day | ORAL | Status: DC
  Administered 2020-12-19 – 2021-01-03 (×16): 81 mg via ORAL
  Filled 2020-12-18 (×16): qty 1

## 2020-12-18 MED ORDER — PIPERACILLIN-TAZOBACTAM 3.375 G IVPB 30 MIN
3.3750 g | Freq: Once | INTRAVENOUS | Status: AC
  Administered 2020-12-18: 3.375 g via INTRAVENOUS
  Filled 2020-12-18: qty 50

## 2020-12-18 MED ORDER — SODIUM CHLORIDE 0.9 % IV SOLN: INTRAVENOUS | Status: DC

## 2020-12-18 MED ORDER — OXYCODONE HCL 5 MG PO TABS
5.0000 mg | ORAL_TABLET | ORAL | Status: DC | PRN
  Administered 2020-12-18 – 2020-12-21 (×6): 5 mg via ORAL
  Filled 2020-12-18 (×6): qty 1

## 2020-12-18 MED ORDER — ACETAMINOPHEN 325 MG PO TABS
650.0000 mg | ORAL_TABLET | Freq: Four times a day (QID) | ORAL | Status: DC | PRN
  Administered 2020-12-18 – 2020-12-25 (×2): 650 mg via ORAL
  Filled 2020-12-18 (×4): qty 2

## 2020-12-18 MED ORDER — BISACODYL 10 MG RE SUPP: 10.0000 mg | Freq: Every day | RECTAL | Status: DC | PRN

## 2020-12-18 MED ORDER — INSULIN ASPART 100 UNIT/ML IJ SOLN
0.0000 [IU] | Freq: Every day | INTRAMUSCULAR | Status: DC
  Administered 2020-12-22: 2 [IU] via SUBCUTANEOUS

## 2020-12-18 NOTE — ED Notes (Addendum)
Report given to [place] with [facility]. Notified husband Chemeka Filice of admission and room assignment at [facility]

## 2020-12-18 NOTE — ED Triage Notes (Signed)
Patient was seen by Dr. Earleen Newport today and advised them to come here for possible IV antibiotic for wound infection.  Wound has been draining greenish foul-smelling drainage.  Pt was on PO antibiotic and it was not working per daughter-in-law.

## 2020-12-18 NOTE — H&P (Signed)
[facility] History and Physical  Jacqueline Richmond HFG:[id number] DOB: [birth year] DOA: 12/18/2020 PCP: Bernerd Limbo, MD  Admitted from: Home Chief Complaint: Right heel wound  History of Present Illness: Jacqueline Richmond is a 75 y.o. female with PMH significant for DM2, HTN, HLD, CKD stage IV, gout, diverticulosis Patient presented to podiatry Dr. Pasty Arch office today for evaluation of worsening wound on the right heel.  Previously, she was tried on Keflex and doxycycline by her PCP without improvement. Patient was last hospitalized from 6/9-6/14 for wound debridement and application of wound VAC on a previously operated T10 fracture site.  Next 2 months, she was at [facility] and was ultimately discharged to home few weeks ago.  Patient states that while at [facility], she developed wound and ulcer in her right heel.  In the office today, she was noted to have redness around the wound with drainage, full-thickness ulceration of the right heel.  There was a a concern of osteomyelitis of the right heel with possible venous insufficiency.  Patient was directed to ED at [facility].   In the ED, patient was afebrile, hemodynamically stable Labs with WBC count 10.1, lactic acid 1.1, hemoglobin 10.4, potassium 5.9, creatinine elevated to 3.5 compared to baseline of 2.1 from 10 days ago X-rays of right tibia-fibula and foot were obtained.  It did not show any evidence of osteomyelitis. For further work-up and management, patient was transferred to hospitalist service at [facility].  At the time of my evaluation, patient was lying on bed.  Not in distress.  Did not complain of pain but had a lot of anticipatory pain before I examined her legs. Patient is alert, awake, oriented x3 able to give her history. No family at bedside.    Review of Systems:  All systems were reviewed and were negative unless otherwise mentioned in the HPI   Past medical history: Past Medical History:  Diagnosis Date    Allergy    Anemia    Arthritis    Cataract    biltaerally    CKD (chronic kidney disease) stage 3, GFR 30-59 ml/min ([place]) 08/29/2011   per patient, kidneys monitored by Dr Posey Pronto endocrinologist ,lov was  05-18-2018, "kidney fx stable"   Colon polyp    Complication of anesthesia    Difficult intubation    small mouth opening   Diverticulosis    Gout    HTN (hypertension) 08/29/2011   Hyperlipemia    Psoriasis    Type II or unspecified type diabetes mellitus without mention of complication, not stated as uncontrolled 08/29/2011    Past surgical history: Past Surgical History:  Procedure Laterality Date   APPLICATION OF WOUND VAC N/A 08/30/2020   Procedure: APPLICATION OF WOUND VAC;  Surgeon: Consuella Lose, MD;  Location: [place];  Service: Neurosurgery;  Laterality: N/A;   BREAST LUMPECTOMY WITH NEEDLE LOCALIZATION Right 05/07/2012   Procedure: BREAST LUMPECTOMY WITH NEEDLE LOCALIZATION;  Surgeon: Merrie Roof, MD;  Location: [place];  Service: General;  Laterality: Right;   CHOLECYSTECTOMY     COLONOSCOPY  2009   [place]    COLONOSCOPY WITH PROPOFOL N/A 05/20/2018   Procedure: COLONOSCOPY WITH PROPOFOL;  Surgeon: Milus Banister, MD;  Location: [facility];  Service: Endoscopy;  Laterality: N/A;   HERNIA REPAIR     HIP SURGERY     LAMINECTOMY WITH POSTERIOR LATERAL ARTHRODESIS LEVEL 4 N/A 08/01/2020   Procedure: LAMINECTOMY WITH POSTERIOR LATERAL ARTHRODESIS Thoracic nine- Thoracic eleven ;  Surgeon: Consuella Lose,  MD;  Location: [place];  Service: Neurosurgery;  Laterality: N/A;   POLYPECTOMY  05/20/2018   Procedure: POLYPECTOMY;  Surgeon: Milus Banister, MD;  Location: [facility];  Service: Endoscopy;;   RHINOPLASTY     SALPINGOOPHORECTOMY     TOTAL HIP ARTHROPLASTY Right 2004   WOUND EXPLORATION N/A 08/30/2020   Procedure: SUPERFICIALWOUND EXPLORATION, APPLY WOUND VAC;  Surgeon: Consuella Lose, MD;  Location: [place];  Service: Neurosurgery;  Laterality: N/A;    Social  History:  reports that she has never smoked. She has never used smokeless tobacco. She reports current alcohol use. She reports that she does not use drugs.  Allergies:  Allergies  Allergen Reactions   Codeine Itching   Iodinated Diagnostic Agents     Other reaction(s): Unknown   Pentazocine Other (See Comments)    Hallucinations   Statins     Family history:  Family History  Problem Relation Age of Onset   Heart disease Father    Arthritis Sister    Colon cancer Neg Hx    Colon polyps Neg Hx    Esophageal cancer Neg Hx    Stomach cancer Neg Hx    Rectal cancer Neg Hx      Home Meds: Prior to Admission medications   Medication Sig Start Date End Date Taking? Authorizing Provider  allopurinol (ZYLOPRIM) 100 MG tablet Take 0.5 tablets (50 mg total) by mouth every other day. TAKE 1/2 TABLET (50MG) BY MOUTH EVERY OTHER DAY FOR GOUT 10/18/20  Yes Medina-Vargas, Monina C, NP  aspirin EC 81 MG tablet Take 81 mg by mouth daily. Swallow whole.   Yes [provider]  atenolol (TENORMIN) 100 MG tablet Take 1 tablet (100 mg total) by mouth in the morning. (0900) 10/18/20  Yes Medina-Vargas, Monina C, NP  cephALEXin (KEFLEX) 500 MG capsule Take 500 mg by mouth 4 (four) times daily.   Yes [provider]  cloNIDine (CATAPRES) 0.2 MG tablet Take 1 tablet (0.2 mg total) by mouth 2 (two) times daily. 10/18/20  Yes Medina-Vargas, Monina C, NP  doxycycline (ADOXA) 100 MG tablet Take 100 mg by mouth 2 (two) times daily. 12/14/20  Yes [provider]  furosemide (LASIX) 40 MG tablet Take 1 tablet (40 mg total) by mouth in the morning. (0900) 10/18/20  Yes Medina-Vargas, Monina C, NP  gabapentin (NEURONTIN) 100 MG capsule Take 100 mg by mouth at bedtime. 12/10/20  Yes [provider]  loratadine (CLARITIN) 10 MG tablet Take 1 tablet (10 mg total) by mouth in the morning. (0900) 10/18/20  Yes Medina-Vargas, Monina C, NP  sitaGLIPtin (JANUVIA) 50 MG tablet Take 1 tablet (50  mg total) by mouth in the morning. (0900) 10/18/20  Yes Medina-Vargas, Monina C, NP  apixaban (ELIQUIS) 2.5 MG TABS tablet Take 1 tablet (2.5 mg total) by mouth 2 (two) times daily. Elevated D-Dimer Patient not taking: No sig reported 10/18/20   Medina-Vargas, Monina C, NP  feeding supplement (ENSURE ENLIVE / ENSURE PLUS) LIQD Take 237 mLs by mouth 3 (three) times daily between meals. Patient not taking: No sig reported 09/03/20   Consuella Lose, MD  ferrous sulfate 325 (65 FE) MG tablet Take 1 tablet (325 mg total) by mouth daily with breakfast. (0900) Patient not taking: No sig reported 10/18/20   Medina-Vargas, Monina C, NP  HYDROcodone-acetaminophen (NORCO/VICODIN) 5-325 MG tablet Take 2 tablets by mouth every 6 (six) hours as needed for moderate pain or severe pain. Patient not taking: No sig reported 10/18/20  10/18/21  Medina-Vargas, Monina C, NP  mometasone (ELOCON) 0.1 % cream Apply 1 application topically daily as needed (psoriasis). Patient not taking: No sig reported 08/08/20   Lavina Hamman, MD  Multiple Vitamin (MULTIVITAMIN WITH MINERALS) TABS tablet Take 1 tablet by mouth daily. Patient not taking: No sig reported 09/03/20   Consuella Lose, MD  ondansetron (ZOFRAN) 4 MG tablet Take 4 mg by mouth every 8 (eight) hours as needed for nausea or vomiting. Patient not taking: No sig reported    [provider]  polyethylene glycol (MIRALAX / GLYCOLAX) 17 g packet Take 17 g by mouth daily. Patient not taking: No sig reported 10/18/20   Durenda Age C, NP    Physical Exam: Vitals:   12/18/20 1354 12/18/20 1420 12/18/20 1600 12/18/20 1726  BP: 127/60 (!) 144/58 135/65 (!) 155/70  Pulse: 77 79 78 87  Resp: _0 Temp:   (!) 97 F (36.1 C) (!) 97.4 F (36.3 C)  TempSrc:   Oral Oral  SpO2: 100% 100% 100% 100%  Weight:      Height:       Wt Readings from Last 3 Encounters:  12/18/20 66.6 kg  12/03/20 66.6 kg  10/18/20 68.2 kg   Body mass index is 25.2  kg/m.  General exam: Pleasant, elderly Caucasian female.  Not in pain at rest Skin: No rashes, lesions or ulcers. HEENT: Atraumatic, normocephalic, no obvious bleeding Lungs: Clear to auscultation bilaterally CVS: Regular rate and rhythm, no murmur GI/Abd soft, nontender, nondistended, bowel sound present CNS: Alert, awake, oriented x3 Psychiatry: Mood appropriate Extremities: Chronic bilateral lower extremity cellulitis with scaling noted.  Right leg is more red, swollen and tender.  There is an ulcer on the right heel     Consult Orders  (From admission, onward)           Start     Ordered   12/18/20 1813  Consult to wound, ostomy, continence  Once       Provider:  (Not yet assigned)  Question:  Reason for Consult?  Answer:  Sacral wound, BLE excoriation, right heel wound   12/18/20 1813   12/18/20 1429  Consult to hospitalist  Called [facility] @ 1435  Once       Provider:  (Not yet assigned)  Question Answer Comment  Place call to: [facility]   Reason for Consult Admit      12/18/20 1429            Labs on Admission:   CBC: Recent Labs  Lab 12/18/20 1323  WBC 10.1  HGB 10.4*  HCT 33.9*  MCV 78.7*  PLT 419    Basic Metabolic Panel: Recent Labs  Lab 12/18/20 1323  NA 137  K 5.9*  CL 106  CO2 20*  GLUCOSE 157*  BUN 106*  CREATININE 3.50*  CALCIUM 10.1    Liver Function Tests: Recent Labs  Lab 12/18/20 1323  AST 10*  ALT 7  ALKPHOS 99  BILITOT 0.3  PROT 7.7  ALBUMIN 3.5   No results for input(s): LIPASE, AMYLASE in the last 168 hours. No results for input(s): AMMONIA in the last 168 hours.  Cardiac Enzymes: No results for input(s): CKTOTAL, CKMB, CKMBINDEX, TROPONINI in the last 168 hours.  BNP (last 3 results) Recent Labs    12/07/20 1322  BNP 126.4*    ProBNP (last 3 results) No results for input(s): PROBNP in the last 8760 hours.  CBG: No results for  input(s): GLUCAP in the last 168 hours.  Lipase      Component Value Date/Time   LIPASE 75 (H) 09/01/2011 0455     Urinalysis    Component Value Date/Time   COLORURINE YELLOW 08/01/2020 2107   APPEARANCEUR HAZY (A) 08/01/2020 2107   LABSPEC 1.012 08/01/2020 2107   PHURINE 5.0 08/01/2020 2107   GLUCOSEU NEGATIVE 08/01/2020 2107   HGBUR NEGATIVE 08/01/2020 2107   BILIRUBINUR NEGATIVE 08/01/2020 2107   [place] NEGATIVE 08/01/2020 2107   PROTEINUR 30 (A) 08/01/2020 2107   UROBILINOGEN 0.2 08/29/2011 1820   NITRITE NEGATIVE 08/01/2020 2107   LEUKOCYTESUR LARGE (A) 08/01/2020 2107     Drugs of Abuse  No results found for: LABOPIA, COCAINSCRNUR, Normal, AMPHETMU, THCU, LABBARB    Radiological Exams on Admission: DG Chest Port 1 View  Result Date: 12/18/2020 CLINICAL DATA:  Weakness.  History of chest trauma in May 22. EXAM: PORTABLE CHEST 1 VIEW COMPARISON:  Chest x-ray 12/03/2020, CT abdomen pelvis 08/01/2020, CT chest 07/31/2020 FINDINGS: The heart and mediastinal contours are unchanged. Aortic calcification. No focal consolidation. No pulmonary edema. Interval decrease in size of a trace to small volume right pleural effusion. No pneumothorax. No acute osseous abnormality. Bilateral shoulder degenerative changes. Surgical hardware of the lower thoracic spine is again noted. IMPRESSION: 1. Interval decrease in size of a persistent trace to small volume right pleural effusion. 2.  Aortic Atherosclerosis (ICD10-I70.0). Electronically Signed   By: Iven Finn M.D.   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  Noted to have cellulitis as well as ulceration and drainage.  Hospitalized with concern of osteomyelitis.  Did not respond to outpatient antibiotics. -Initial x-ray did not show any evidence of osteomyelitis.  May  need MRI. -Blood culture was sent. Patient was started on Vanco and Zosyn in the ED. -We will continue the same at this time. -Podiatry to follow.  AKI on CKD 4 -Baseline creatinine 2.1 with EGFR 26 from 10 days ago -Present with creatinine elevated to 3.5.  Started IV fluid.  Keep diuretics/nephrotoxic medications on hold.  Follows up with nephrologist Dr. Posey Pronto as an outpatient. Recent Labs    08/07/20 0208 08/08/20 0056 08/30/20 1339 08/30/20 2057 08/31/20 0507 09/01/20 1514 09/29/20 0000 10/17/20 0000 12/03/20 1044 12/07/20 1322 12/18/20 1323  BUN 68* 62* 52*  --  47* 43* 56* 33* 48* 64* 106*  CREATININE 1.76* 1.53* 3.30* 3.15* 3.13* 2.67* 1.9* 1.7* 1.64* 2.01* 3.50*   Hyperkalemia -Potassium level elevated to 5.9 on initial lab today. -Lokelma 1 dose was given in the ED. Recent Labs  Lab 12/18/20 1323  K 5.9*   Type 2 diabetes mellitus -A1c 5.4 on May 2022 -Home meds include Januvia 50 mg daily -Been on hold.  Start sliding scale insulin with Accu-Cheks. Lab Results  Component Value Date   HGBA1C 5.4 08/01/2020   No results for input(s): GLUCAP in the last 168 hours.  Essential hypertension -Home meds include atenolol 100 mg daily and clonidine 0.2 mg daily. -Resume atenolol.  Keep clonidine on hold at this time.  Continue to monitor.  Hyperlipidemia -Does not seem to be on statin.  Continue aspirin.  History of gout -Allopurinol on hold  Microcytic anemia of chronic disease -No active blood loss.  Hemoglobin more than 10, stable.  Continue to monitor. Recent Labs    08/30/20 2057 09/11/20  0000 10/17/20 0000 12/07/20 1322 12/18/20 1323  HGB 9.3* 8.6* 10.5* 11.3* 10.4*  MCV 86.7  --   --  79.0* 78.7*    Mobility: Needs PT eval postprocedure Code Status:   Code Status: Full Code  DVT prophylaxis: Heparin subcu Antimicrobials: Zosyn and vancomycin Fluid: NS at 75 mill per hour  Diet:  Diet Order             Diet heart healthy/carb modified  Room service appropriate? Yes; Fluid consistency: Thin  Diet effective now                   Consultants: Podiatry, will call nephrology tomorrow. Family Communication: None at bedside  Dispo: The patient is from: Home              Anticipated d/c is to: Home versus rehab, depends on PT recommendation postprocedure              Anticipated d/c date is: > 3 days  ------------------------------------------------------------------------------------- Severity of Illness: The appropriate patient status for this patient is INPATIENT. Inpatient status is judged to be reasonable and necessary in order to provide the required intensity of service to ensure the patient's safety. The patient's presenting symptoms, physical exam findings, and initial radiographic and laboratory data in the context of their chronic comorbidities is felt to place them at high risk for further clinical deterioration. Furthermore, it is not anticipated that the patient will be medically stable for discharge from the hospital within 2 midnights of admission. The following factors support the patient status of inpatient.   " The patient's presenting symptoms include worsening right foot wound and ulcer. " The worrisome physical exam findings include right foot cellulitis and ulcer. " The initial radiographic and laboratory data are worrisome because of AKI. " The chronic co-morbidities include CKD.   * I certify that at the point of admission it is my clinical judgment that the patient will require inpatient hospital care spanning beyond 2 midnights from the point of admission due to high intensity of service, high risk for further deterioration and high frequency of surveillance required.*   Signed, Terrilee Croak, MD [facility] 12/18/2020

## 2020-12-18 NOTE — Progress Notes (Signed)
Pharmacy Antibiotic Note  Jacqueline Richmond is a 75 y.o. female admitted on 12/18/2020 with cellulitis.  Pharmacy has been consulted for vancomycin dosing. Pt is afebrile and WBC is WNL. SCr is well above baseline at 3.5. Lactic acid is <2.   Plan: Vancomycin 1gm IV x 1 then trend Scr for further doses F/u renal fxn, C&S, clinical status and peak/trough at [facility] F/u continuation of gram neg coverage  Height: 5\' 4"  (162.6 cm) Weight: 66.6 kg (146 lb 12.8 oz) IBW/kg (Calculated) : 54.7  Temp (24hrs), Avg:96 F (35.6 C), Min:96 F (35.6 C), Max:96 F (35.6 C)  Recent Labs  Lab 12/18/20 1323  WBC 10.1  CREATININE 3.50*  LATICACIDVEN 1.1    Estimated Creatinine Clearance: 13.2 mL/min (A) (by C-G formula based on SCr of 3.5 mg/dL (H)).    Allergies  Allergen Reactions   Codeine Itching   Iodinated Diagnostic Agents     Other reaction(s): Unknown   Pentazocine Other (See Comments)    Hallucinations   Statins     Antimicrobials this admission: Vanc 9/27>> Zosyn x 1 9/27  Dose adjustments this admission: N/A  Microbiology results: Pending  Thank you for allowing pharmacy to be a part of this patient's care.  Jacqueline Richmond, Rande Lawman 12/18/2020 3:56 PM

## 2020-12-18 NOTE — ED Provider Notes (Signed)
[facility] EMERGENCY DEPT Provider Note   CSN: [id number] Arrival date & time: 12/18/20  1229     History Chief Complaint  Patient presents with   leg wound    Jacqueline Richmond is a 75 y.o. female.  Pt with hx diabetes, presents with worsening appearance of right lower extremity wound and cellulitis. Symptoms acute onset in past few weeks, moderate, dull, constant, persistent, worsening. Has hx venous insufficiency to leg, recently saw pcp and ED - has been on keflex and doxycycline with no improvement in symptoms and persistent greenish drainage from wound. No fever/chills. Was seen by podiatry today and was referred for admission for antibiotics, possible vascular studies, possible mri, and wound care management.   The history is provided by the patient, medical records and a relative.      Past Medical History:  Diagnosis Date   Allergy    Anemia    Arthritis    Cataract    biltaerally    CKD (chronic kidney disease) stage 3, GFR 30-59 ml/min (HCC) 08/29/2011   per patient, kidneys monitored by Dr Posey Pronto endocrinologist ,lov was  05-18-2018, "kidney fx stable"   Colon polyp    Complication of anesthesia    Difficult intubation    small mouth opening   Diverticulosis    Gout    HTN (hypertension) 08/29/2011   Hyperlipemia    Psoriasis    Type II or unspecified type diabetes mellitus without mention of complication, not stated as uncontrolled 08/29/2011    Patient Active Problem List   Diagnosis Date Noted   Wound dehiscence 08/30/2020   Multiple thyroid nodules 08/10/2020   Acute blood loss anemia 08/09/2020   Hemothorax on right 08/02/2020   T10 vertebral fracture ([place]) 07/31/2020   Rotator cuff arthropathy of both shoulders 04/15/2019   History of colonic polyps    Polyp of transverse colon    Diverticulosis of colon without hemorrhage    Difficult intubation    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   Uncontrolled secondary diabetes mellitus with stage 3 CKD  (GFR 30-59) ([place]) 08/29/2011   CKD (chronic kidney disease) stage 3, GFR 30-59 ml/min (HCC) 08/29/2011   Acute renal failure superimposed on stage 3a chronic kidney disease ([place]) 08/29/2011   Hyperkalemia 08/29/2011   HTN (hypertension) 08/29/2011   Gout 08/29/2011    Past Surgical History:  Procedure Laterality Date   APPLICATION OF WOUND VAC N/A 08/30/2020   Procedure: APPLICATION OF WOUND VAC;  Surgeon: Consuella Lose, MD;  Location: [facility];  Service: Neurosurgery;  Laterality: N/A;   BREAST LUMPECTOMY WITH NEEDLE LOCALIZATION Right 05/07/2012   Procedure: BREAST LUMPECTOMY WITH NEEDLE LOCALIZATION;  Surgeon: Merrie Roof, MD;  Location: [facility];  Service: General;  Laterality: Right;   CHOLECYSTECTOMY     COLONOSCOPY  2009   [facility]    COLONOSCOPY WITH PROPOFOL N/A 05/20/2018   Procedure: COLONOSCOPY WITH PROPOFOL;  Surgeon: Milus Banister, MD;  Location: [facility];  Service: Endoscopy;  Laterality: N/A;   HERNIA REPAIR     HIP SURGERY     LAMINECTOMY WITH POSTERIOR LATERAL ARTHRODESIS LEVEL 4 N/A 08/01/2020   Procedure: LAMINECTOMY WITH POSTERIOR LATERAL ARTHRODESIS Thoracic nine- Thoracic eleven ;  Surgeon: Consuella Lose, MD;  Location: [facility];  Service: Neurosurgery;  Laterality: N/A;   POLYPECTOMY  05/20/2018   Procedure: POLYPECTOMY;  Surgeon: Milus Banister, MD;  Location: [facility];  Service: Endoscopy;;   RHINOPLASTY     SALPINGOOPHORECTOMY  TOTAL HIP ARTHROPLASTY Right 2004   WOUND EXPLORATION N/A 08/30/2020   Procedure: SUPERFICIALWOUND EXPLORATION, APPLY WOUND VAC;  Surgeon: Consuella Lose, MD;  Location: [facility];  Service: Neurosurgery;  Laterality: N/A;     OB History     Gravida  1   Para  1   Term      Preterm      AB      Living         SAB      IAB      Ectopic      Multiple      Live Births              Family History  Problem Relation Age of Onset   Heart disease Father    Arthritis Sister    Colon cancer Neg Hx     Colon polyps Neg Hx    Esophageal cancer Neg Hx    Stomach cancer Neg Hx    Rectal cancer Neg Hx     Social History   Tobacco Use   Smoking status: Never   Smokeless tobacco: Never  Vaping Use   Vaping Use: Never used  Substance Use Topics   Alcohol use: Yes    Comment: Drinks on Special Occasions   Drug use: No    Home Medications Prior to Admission medications   Medication Sig Start Date End Date Taking? Authorizing Provider  allopurinol (ZYLOPRIM) 100 MG tablet Take 0.5 tablets (50 mg total) by mouth every other day. TAKE 1/2 TABLET (50MG ) BY MOUTH EVERY OTHER DAY FOR GOUT 10/18/20   Medina-Vargas, Monina C, NP  apixaban (ELIQUIS) 2.5 MG TABS tablet Take 1 tablet (2.5 mg total) by mouth 2 (two) times daily. Elevated D-Dimer 10/18/20   Medina-Vargas, Monina C, NP  atenolol (TENORMIN) 100 MG tablet Take 1 tablet (100 mg total) by mouth in the morning. (0900) 10/18/20   Medina-Vargas, Monina C, NP  b complex vitamins tablet Take 1 tablet by mouth in the morning. (0900)    [provider]  bisacodyl (DULCOLAX) 10 MG suppository Place 10 mg rectally as needed for moderate constipation.    [provider]  cloNIDine (CATAPRES) 0.2 MG tablet Take 1 tablet (0.2 mg total) by mouth 2 (two) times daily. 10/18/20   Medina-Vargas, Monina C, NP  feeding supplement (ENSURE ENLIVE / ENSURE PLUS) LIQD Take 237 mLs by mouth 3 (three) times daily between meals. 09/03/20   Consuella Lose, MD  ferrous sulfate 325 (65 FE) MG tablet Take 1 tablet (325 mg total) by mouth daily with breakfast. (0900) 10/18/20   Medina-Vargas, Monina C, NP  furosemide (LASIX) 40 MG tablet Take 1 tablet (40 mg total) by mouth in the morning. (0900) 10/18/20   Medina-Vargas, Monina C, NP  HYDROcodone-acetaminophen (NORCO/VICODIN) 5-325 MG tablet Take 2 tablets by mouth every 6 (six) hours as needed for moderate pain or severe pain. 10/18/20 10/18/21  Medina-Vargas, Monina C, NP  loratadine (CLARITIN) 10 MG  tablet Take 1 tablet (10 mg total) by mouth in the morning. (0900) 10/18/20   Medina-Vargas, Monina C, NP  magnesium hydroxide (MILK OF MAGNESIA) 400 MG/5ML suspension Take 30 mLs by mouth daily as needed for mild constipation.    [provider]  Menthol, Topical Analgesic, (BIOFREEZE) 4 % GEL Apply 1 application topically as directed. To right foot three times daily for pain    [provider]  mometasone (ELOCON) 0.1 % cream Apply 1 application topically daily as needed (  psoriasis). 08/08/20   Lavina Hamman, MD  Multiple Vitamin (MULTIVITAMIN WITH MINERALS) TABS tablet Take 1 tablet by mouth daily. 09/03/20   Consuella Lose, MD  NON FORMULARY Diet: Heart Healthy, NAS, Regular textute Thin liquids    [provider]  Omega-3 1000 MG CAPS Take 1,000 mg by mouth 2 (two) times daily. (0900 & 2100)    [provider]  ondansetron (ZOFRAN) 4 MG tablet Take 4 mg by mouth every 8 (eight) hours as needed for nausea or vomiting.    [provider]  polyethylene glycol (MIRALAX / GLYCOLAX) 17 g packet Take 17 g by mouth daily. 10/18/20   Medina-Vargas, Monina C, NP  sitaGLIPtin (JANUVIA) 50 MG tablet Take 1 tablet (50 mg total) by mouth in the morning. (0900) 10/18/20   Medina-Vargas, Monina C, NP  Sodium Phosphates (RA SALINE ENEMA RE) Place 1 Dose rectally as needed. If not relieved by Biscodyl suppository, givedisposable Saline Enema rectally X 1 dose/24 hrs as needed    [provider]    Allergies    Codeine, Iodinated diagnostic agents, Pentazocine, and Statins  Review of Systems   Review of Systems  Constitutional:  Negative for chills and fever.  HENT:  Negative for sore throat.   Eyes:  Negative for redness.  Respiratory:  Negative for shortness of breath.   Cardiovascular:  Negative for chest pain.  Gastrointestinal:  Negative for abdominal pain.  Genitourinary:  Negative for flank pain.  Musculoskeletal:  Negative for back pain.   Skin:  Positive for wound.  Neurological:  Negative for headaches.  Hematological:  Does not bruise/bleed easily.  Psychiatric/Behavioral:  Negative for confusion.    Physical Exam Updated Vital Signs BP (!) 102/49 (BP Location: Left Arm)   Pulse 69   Temp (!) 96 F (35.6 C)   Resp 16   Ht 1.626 m (5\' 4" )   Wt 66.6 kg   SpO2 100%   BMI 25.20 kg/m   Physical Exam Vitals and nursing note reviewed.  Constitutional:      Appearance: Normal appearance. She is well-developed.  HENT:     Head: Atraumatic.     Nose: Nose normal.     Mouth/Throat:     Mouth: Mucous membranes are moist.  Eyes:     General: No scleral icterus.    Conjunctiva/sclera: Conjunctivae normal.  Neck:     Trachea: No tracheal deviation.  Cardiovascular:     Rate and Rhythm: Normal rate and regular rhythm.     Pulses: Normal pulses.     Heart sounds: Normal heart sounds. No murmur heard.   No friction rub. No gallop.  Pulmonary:     Effort: Pulmonary effort is normal. No respiratory distress.     Breath sounds: Normal breath sounds.  Abdominal:     General: Bowel sounds are normal. There is no distension.     Palpations: Abdomen is soft.     Tenderness: There is no abdominal tenderness.  Genitourinary:    Comments: No cva tenderness.  Musculoskeletal:     Cervical back: Normal range of motion and neck supple. No rigidity. No muscular tenderness.     Comments: Dry, cracked skin to bilateral lower extremities, and skin discoloration c/w chronic venous statis/insufficiency. On the right, increased erythema and warmth to skin of lower leg and foot, with heel ulcer, and yellowish/greenish drainage.  Distal pulse palp. No crepitus. No necrotic or devitalized tissue.   Skin:    General: Skin is warm and  dry.     Findings: No rash.  Neurological:     Mental Status: She is alert.     Comments: Alert, speech normal. RLE NVI.  Psychiatric:        Mood and Affect: Mood normal.    ED Results / Procedures  / Treatments   Labs (all labs ordered are listed, but only abnormal results are displayed) Results for orders placed or performed during the hospital encounter of 12/18/20  Resp Panel by RT-PCR (Flu A&B, Covid) Nasopharyngeal Swab   Specimen: Nasopharyngeal Swab; Nasopharyngeal(NP) swabs in vial transport medium  Result Value Ref Range   SARS Coronavirus 2 by RT PCR NEGATIVE NEGATIVE   Influenza A by PCR NEGATIVE NEGATIVE   Influenza B by PCR NEGATIVE NEGATIVE  CBC  Result Value Ref Range   WBC 10.1 4.0 - 10.5 K/uL   RBC 4.31 3.87 - 5.11 MIL/uL   Hemoglobin 10.4 (L) 12.0 - 15.0 g/dL   HCT 33.9 (L) 36.0 - 46.0 %   MCV 78.7 (L) 80.0 - 100.0 fL   MCH 24.1 (L) 26.0 - 34.0 pg   MCHC 30.7 30.0 - 36.0 g/dL   RDW 17.0 (H) 11.5 - 15.5 %   Platelets 313 150 - 400 K/uL   nRBC 0.0 0.0 - 0.2 %  Comprehensive metabolic panel  Result Value Ref Range   Sodium 137 135 - 145 mmol/L   Potassium 5.9 (H) 3.5 - 5.1 mmol/L   Chloride 106 98 - 111 mmol/L   CO2 20 (L) 22 - 32 mmol/L   Glucose, Bld 157 (H) 70 - 99 mg/dL   BUN PENDING 8 - 23 mg/dL   Creatinine, Ser 3.50 (H) 0.44 - 1.00 mg/dL   Calcium 10.1 8.9 - 10.3 mg/dL   Total Protein 7.7 6.5 - 8.1 g/dL   Albumin 3.5 3.5 - 5.0 g/dL   AST 10 (L) 15 - 41 U/L   ALT 7 0 - 44 U/L   Alkaline Phosphatase 99 38 - 126 U/L   Total Bilirubin 0.3 0.3 - 1.2 mg/dL   GFR, Estimated 13 (L) >60 mL/min   Anion gap 11 5 - 15  Lactic acid, plasma  Result Value Ref Range   Lactic Acid, Venous 1.1 0.5 - 1.9 mmol/L   DG Chest 2 View  Result Date: 12/03/2020 CLINICAL DATA:  75 year old female with a history of pleural effusion EXAM: CHEST - 2 VIEW COMPARISON:  08/04/2020, thoracic plain film 08/23/2020 FINDINGS: Cardiomediastinal silhouette unchanged in size and contour. No evidence of central vascular congestion. No interlobular septal thickening. Opacity at the right lung base obscuring the right hemidiaphragm, is relatively unchanged when compared to the thoracic  plain film 08/23/2020 along the medial component. No confluent airspace disease in the left lung or the residual right lung. Mild bronchial wall thickening. Coarsened interstitial markings persist. No acute displaced fracture. Surgical changes of lower thoracic fixation with no hardware fracture identified. IMPRESSION: Opacity at the right lung base, persisting from the thoracic plain film dated 08/23/2020, likely a combination of pleural fluid, atelectasis, and/or consolidation. Surgical changes of the thoracic spine. Electronically Signed   By: Corrie Mckusick D.O.   On: 12/03/2020 13:44   DG Tibia/Fibula Right  Result Date: 12/07/2020 CLINICAL DATA:  Leg and foot swelling with erythema. Concern for fracture or osteomyelitis. Possible infection of venous stasis ulcer to right leg. EXAM: RIGHT TIBIA AND FIBULA - 2 VIEW COMPARISON:  None. FINDINGS: The bones are diffusely demineralized. There is no evidence of  acute fracture, dislocation or bone destruction. Tricompartmental degenerative changes and meniscal chondrocalcinosis are noted at the knee. There mild degenerative changes within the ankle and visualized hindfoot. Diffuse vascular calcifications are noted. The soft tissues appear edematous, without evidence of soft tissue emphysema or foreign body. IMPRESSION: No radiographic evidence of osteomyelitis. Nonspecific diffuse soft tissue edema which could reflect cellulitis. Electronically Signed   By: Richardean Sale M.D.   On: 12/07/2020 13:52   DG Foot Complete Right  Result Date: 12/07/2020 CLINICAL DATA:  Right lower extremity swelling. EXAM: RIGHT FOOT COMPLETE - 3+ VIEW COMPARISON:  None. FINDINGS: There is no evidence of fracture or dislocation. Vascular calcifications are noted. Moderate posterior calcaneal spurring is noted. Degenerative changes are seen involving the tarsal and tarsometatarsal joints. No lytic destruction is seen to suggest osteomyelitis. IMPRESSION: Chronic findings as described  above. No lytic destruction is seen to suggest osteomyelitis. Electronically Signed   By: Marijo Conception M.D.   On: 12/07/2020 13:51    EKG None  Radiology No results found.  Procedures Procedures   Medications Ordered in ED Medications - No data to display  ED Course  I have reviewed the triage vital signs and the nursing notes.  Pertinent labs & imaging results that were available during my care of the patient were reviewed by me and considered in my medical decision making (see chart for details).    MDM Rules/Calculators/A&P                          Iv ns. Cultures, lactate, labs.   Reviewed nursing notes and prior charts for additional history.  Recent plain xrays - xrays reviewed/interpreted by me - with no obvious osteo.   Labs reviewed/interpreted by me - lactate normal. Cr is high c/w aki. K high. Ca cl iv, hco3 iv, d50 iv, novolog iv. Lokelma po.   Given progressive symptoms, not improved on outpatient doxy/keflex-  will admit for further management.   Hospitalists consulted for admission. Discussed pt with [facility] hospitalist who accepts for admission. He requests pt also be given vanc - ordered.   Discussed plan w pt/family.  CRITICAL CARE RE: AKI w hyperkalemia requiring iv ca/parenteral therapy, wound infection/cellulitis.  Performed by: Mirna Mires Total critical care time: 45 minutes Critical care time was exclusive of separately billable procedures and treating other patients. Critical care was necessary to treat or prevent imminent or life-threatening deterioration. Critical care was time spent personally by me on the following activities: development of treatment plan with patient and/or surrogate as well as nursing, discussions with consultants, evaluation of patient's response to treatment, examination of patient, obtaining history from patient or surrogate, ordering and performing treatments and interventions, ordering and review of laboratory studies,  ordering and review of radiographic studies, pulse oximetry and re-evaluation of patient's condition.     Final Clinical Impression(s) / ED Diagnoses Final diagnoses:  None    Rx / DC Orders ED Discharge Orders     None        Lajean Saver, MD 12/18/20 1500

## 2020-12-18 NOTE — ED Notes (Signed)
Report called to Jarrett Soho RN receiving nurse at [facility]

## 2020-12-18 NOTE — Progress Notes (Signed)
Pharmacy Antibiotic Note  Jacqueline Richmond is a 75 y.o. female admitted on 12/18/2020 with  right foot wound infection .  Pharmacy consulted to dose vancomycin earlier today.  This evening, Pharmacy has been consulted for Zosyn dosing.  9/27 1522 vancomycin 1 g IV x 1  9/27 1427 - Zosyn 3.375g IV x 1  Plan: Due to poor renal function, Pharmacy will order intermittent vancomycin doses as necessary Zosyn 2.25 g IV every 8 hours Monitor clinical progress, renal function F/U C&S, abx deescalation / LOT   Height: 5\' 4"  (162.6 cm) Weight: 66.6 kg (146 lb 12.8 oz) IBW/kg (Calculated) : 54.7  Temp (24hrs), Avg:96.8 F (36 C), Min:96 F (35.6 C), Max:97.4 F (36.3 C)  Recent Labs  Lab 12/18/20 1323  WBC 10.1  CREATININE 3.50*  LATICACIDVEN 1.1    Estimated Creatinine Clearance: 13.2 mL/min (A) (by C-G formula based on SCr of 3.5 mg/dL (H)).    Allergies  Allergen Reactions   Codeine Itching   Iodinated Diagnostic Agents     Other reaction(s): Unknown   Pentazocine Other (See Comments)    Hallucinations   Statins     Antimicrobials this admission: 9/27 1522 vancomycin 1 g IV x 1  9/27 Zosyn  >>    Microbiology results: 9/27 BCx: sent   Thank you for allowing pharmacy to be a part of this patient's care.  Royetta Asal, PharmD, BCPS Clinical Pharmacist [place] Please utilize Amion for appropriate phone number to reach the unit pharmacist ([facility]) 12/18/2020 6:46 PM

## 2020-12-18 NOTE — Progress Notes (Signed)
Subjective:   Patient ID: Jacqueline Richmond, female   DOB: 75 y.o.   MRN: [id number]   HPI 75 year old female presents the office today with family member for concerns of wound, infection on her right side worse than her left.  She states this started after she was in rehab at [facility] for spinal surgery.  She is followed up with her primary care physician she has been on antibiotics including cephalexin and doxycycline without any improvement.  They have noticed continued drainage coming from the right leg and redness.   Review of Systems  All other systems reviewed and are negative.  Past Medical History:  Diagnosis Date   Allergy    Anemia    Arthritis    Cataract    biltaerally    CKD (chronic kidney disease) stage 3, GFR 30-59 ml/min ([place]) 08/29/2011   per patient, kidneys monitored by Dr Posey Pronto endocrinologist ,lov was  05-18-2018, "kidney fx stable"   Colon polyp    Complication of anesthesia    Difficult intubation    small mouth opening   Diverticulosis    Gout    HTN (hypertension) 08/29/2011   Hyperlipemia    Psoriasis    Type II or unspecified type diabetes mellitus without mention of complication, not stated as uncontrolled 08/29/2011    Past Surgical History:  Procedure Laterality Date   APPLICATION OF WOUND VAC N/A 08/30/2020   Procedure: APPLICATION OF WOUND VAC;  Surgeon: Consuella Lose, MD;  Location: [place];  Service: Neurosurgery;  Laterality: N/A;   BREAST LUMPECTOMY WITH NEEDLE LOCALIZATION Right 05/07/2012   Procedure: BREAST LUMPECTOMY WITH NEEDLE LOCALIZATION;  Surgeon: Merrie Roof, MD;  Location: [place];  Service: General;  Laterality: Right;   CHOLECYSTECTOMY     COLONOSCOPY  2009   [place]    COLONOSCOPY WITH PROPOFOL N/A 05/20/2018   Procedure: COLONOSCOPY WITH PROPOFOL;  Surgeon: Milus Banister, MD;  Location: [facility];  Service: Endoscopy;  Laterality: N/A;   HERNIA REPAIR     HIP SURGERY     LAMINECTOMY WITH POSTERIOR LATERAL ARTHRODESIS LEVEL 4  N/A 08/01/2020   Procedure: LAMINECTOMY WITH POSTERIOR LATERAL ARTHRODESIS Thoracic nine- Thoracic eleven ;  Surgeon: Consuella Lose, MD;  Location: [place];  Service: Neurosurgery;  Laterality: N/A;   POLYPECTOMY  05/20/2018   Procedure: POLYPECTOMY;  Surgeon: Milus Banister, MD;  Location: [facility];  Service: Endoscopy;;   RHINOPLASTY     SALPINGOOPHORECTOMY     TOTAL HIP ARTHROPLASTY Right 2004   WOUND EXPLORATION N/A 08/30/2020   Procedure: SUPERFICIALWOUND EXPLORATION, APPLY WOUND VAC;  Surgeon: Consuella Lose, MD;  Location: [place];  Service: Neurosurgery;  Laterality: N/A;     Current Outpatient Medications:    allopurinol (ZYLOPRIM) 100 MG tablet, Take 0.5 tablets (50 mg total) by mouth every other day. TAKE 1/2 TABLET (50MG ) BY MOUTH EVERY OTHER DAY FOR GOUT, Disp: 7.5 tablet, Rfl: 0   apixaban (ELIQUIS) 2.5 MG TABS tablet, Take 1 tablet (2.5 mg total) by mouth 2 (two) times daily. Elevated D-Dimer, Disp: 60 tablet, Rfl: 0   atenolol (TENORMIN) 100 MG tablet, Take 1 tablet (100 mg total) by mouth in the morning. (0900), Disp: 30 tablet, Rfl: 0   b complex vitamins tablet, Take 1 tablet by mouth in the morning. (0900), Disp: , Rfl:    bisacodyl (DULCOLAX) 10 MG suppository, Place 10 mg rectally as needed for moderate constipation., Disp: , Rfl:    cloNIDine (CATAPRES) 0.2 MG tablet, Take  1 tablet (0.2 mg total) by mouth 2 (two) times daily., Disp: 60 tablet, Rfl: 0   feeding supplement (ENSURE ENLIVE / ENSURE PLUS) LIQD, Take 237 mLs by mouth 3 (three) times daily between meals., Disp: 237 mL, Rfl: 12   ferrous sulfate 325 (65 FE) MG tablet, Take 1 tablet (325 mg total) by mouth daily with breakfast. (0900), Disp: 30 tablet, Rfl: 0   furosemide (LASIX) 40 MG tablet, Take 1 tablet (40 mg total) by mouth in the morning. (0900), Disp: 30 tablet, Rfl: 0   HYDROcodone-acetaminophen (NORCO/VICODIN) 5-325 MG tablet, Take 2 tablets by mouth every 6 (six) hours as needed for moderate pain  or severe pain., Disp: 30 tablet, Rfl: 0   loratadine (CLARITIN) 10 MG tablet, Take 1 tablet (10 mg total) by mouth in the morning. (0900), Disp: 30 tablet, Rfl: 0   magnesium hydroxide (MILK OF MAGNESIA) 400 MG/5ML suspension, Take 30 mLs by mouth daily as needed for mild constipation., Disp: , Rfl:    Menthol, Topical Analgesic, (BIOFREEZE) 4 % GEL, Apply 1 application topically as directed. To right foot three times daily for pain, Disp: , Rfl:    mometasone (ELOCON) 0.1 % cream, Apply 1 application topically daily as needed (psoriasis)., Disp: 45 g, Rfl: 0   Multiple Vitamin (MULTIVITAMIN WITH MINERALS) TABS tablet, Take 1 tablet by mouth daily., Disp: , Rfl:    NON FORMULARY, Diet: Heart Healthy, NAS, Regular textute Thin liquids, Disp: , Rfl:    Omega-3 1000 MG CAPS, Take 1,000 mg by mouth 2 (two) times daily. (0900 & 2100), Disp: , Rfl:    ondansetron (ZOFRAN) 4 MG tablet, Take 4 mg by mouth every 8 (eight) hours as needed for nausea or vomiting., Disp: , Rfl:    polyethylene glycol (MIRALAX / GLYCOLAX) 17 g packet, Take 17 g by mouth daily., Disp: 14 each, Rfl: 1   sitaGLIPtin (JANUVIA) 50 MG tablet, Take 1 tablet (50 mg total) by mouth in the morning. (0900), Disp: 30 tablet, Rfl: 0   Sodium Phosphates (RA SALINE ENEMA RE), Place 1 Dose rectally as needed. If not relieved by Biscodyl suppository, givedisposable Saline Enema rectally X 1 dose/24 hrs as needed, Disp: , Rfl:   Allergies  Allergen Reactions   Codeine Itching   Iodinated Diagnostic Agents     Other reaction(s): Unknown   Pentazocine Other (See Comments)    Hallucinations   Statins           Objective:  Physical Exam  General: AAO x3, NAD  Dermatological: Full-thickness ulceration present right heel but there is no probing to bone.  There is venous insufficiency present bilaterally right side worse than left and there is drainage and small amount of purulence coming from the leg.   Vascular: Difficult to palpate  pulses  Neruologic: Sensation decreased with Semmes Weinstein monofilament  Musculoskeletal: Mild discomfort present to the right heel.  Muscular strength 5/5 in all groups tested bilateral.     Assessment:   Concern for osteomyelitis right heel with cellulitis/venous insufficiency right side worse than left     Plan:  -Treatment options discussed including all alternatives, risks, and complications -Etiology of symptoms were discussed -Reviewed the x-rays that she had done previously.  White count minimally elevated last week.  She has been on 2 g of oral antibiotics and not seeing any significant improvement.  I am concerned about a deeper infection, osteomyelitis.  Given that she is been on oral antibiotics I do recommend that emergency  department for further evaluation and likely admission to the hospital.  She likely need IV antibiotics, MRI and vascular consult.  If admitted podiatry will also follow. -Recommended the patient go to [facility] however when she was leaving I was told that she is can be going to [facility] ER.  Trula Slade DPM

## 2020-12-18 NOTE — Progress Notes (Signed)
Patient is a 75 year old female with history of diabetes type 2, hypertension, CKD stage IV who presented to office of Dr. Jacqualyn Posey, podiatry, for the evaluation of worsening wound on the right heel.  She was following with her PCP and was treated with Keflex and doxycycline but her wound was not improving, noticed drainage from the wound with redness.  Found to have full-thickness ulceration on the right heel.  There was concern for osteomyelitis of right heel with cellulitis/venous insufficiency so she was asked to go to the emergency department.  ER at [facility] call us for admission at [facility] for treatment of the wound with IV antibiotics, fluids.  She might need MRI and vascular surgery consultation.  Podiatry will also follow her .  On presentation she was hemodynamically stable.  Lab work showed worsening kidney function with creatinine of 3.5.  Creatinine was 1.6 on 12/03/2020.  Potassium was noted to be 5.9.  We will admit her to [facility]. She will need MRI, possible ABI, vascular surgery/podiatry consultation, blood cultures after she presents here.

## 2020-12-19 ENCOUNTER — Inpatient Hospital Stay (HOSPITAL_COMMUNITY): Payer: Medicare Other

## 2020-12-19 DIAGNOSIS — E118 Type 2 diabetes mellitus with unspecified complications: Secondary | ICD-10-CM | POA: Diagnosis not present

## 2020-12-19 DIAGNOSIS — E875 Hyperkalemia: Secondary | ICD-10-CM | POA: Diagnosis not present

## 2020-12-19 DIAGNOSIS — S81801A Unspecified open wound, right lower leg, initial encounter: Secondary | ICD-10-CM | POA: Diagnosis not present

## 2020-12-19 DIAGNOSIS — L03115 Cellulitis of right lower limb: Secondary | ICD-10-CM | POA: Diagnosis not present

## 2020-12-19 DIAGNOSIS — L0390 Cellulitis, unspecified: Secondary | ICD-10-CM | POA: Diagnosis not present

## 2020-12-19 DIAGNOSIS — E1151 Type 2 diabetes mellitus with diabetic peripheral angiopathy without gangrene: Secondary | ICD-10-CM | POA: Diagnosis not present

## 2020-12-19 LAB — GLUCOSE, CAPILLARY
Glucose-Capillary: 97 mg/dL (ref 70–99)
Glucose-Capillary: 123 mg/dL — ABNORMAL HIGH (ref 70–99)
Glucose-Capillary: 91 mg/dL (ref 70–99)
Glucose-Capillary: 107 mg/dL — ABNORMAL HIGH (ref 70–99)

## 2020-12-19 LAB — BASIC METABOLIC PANEL
Anion gap: 9 (ref 5–15)
BUN: 110 mg/dL — ABNORMAL HIGH (ref 8–23)
CO2: 21 mmol/L — ABNORMAL LOW (ref 22–32)
Calcium: 9.4 mg/dL (ref 8.9–10.3)
Chloride: 112 mmol/L — ABNORMAL HIGH (ref 98–111)
Creatinine, Ser: 2.93 mg/dL — ABNORMAL HIGH (ref 0.44–1.00)
GFR, Estimated: 16 mL/min — ABNORMAL LOW (ref >60)
Glucose, Bld: 101 mg/dL — ABNORMAL HIGH (ref 70–99)
Potassium: 5.6 mmol/L — ABNORMAL HIGH (ref 3.5–5.1)
Sodium: 142 mmol/L (ref 135–145)

## 2020-12-19 LAB — CBC
HCT: 31.2 % — ABNORMAL LOW (ref 36.0–46.0)
Hemoglobin: 9.6 g/dL — ABNORMAL LOW (ref 12.0–15.0)
MCH: 24.4 pg — ABNORMAL LOW (ref 26.0–34.0)
MCHC: 30.8 g/dL (ref 30.0–36.0)
MCV: 79.2 fL — ABNORMAL LOW (ref 80.0–100.0)
Platelets: 296 10*3/uL (ref 150–400)
RBC: 3.94 MIL/uL (ref 3.87–5.11)
RDW: 17.1 % — ABNORMAL HIGH (ref 11.5–15.5)
WBC: 11 10*3/uL — ABNORMAL HIGH (ref 4.0–10.5)
nRBC: 0 % (ref 0.0–0.2)

## 2020-12-19 IMAGING — MR MR HEEL *R* W/O CM
6 series · 40 of 40 positions shown · non-contrast
Comparison: None.

CLINICAL DATA: Foot swelling and pain.

EXAM:
MR OF THE RIGHT HEEL WITHOUT CONTRAST
TECHNIQUE: Multiplanar, multisequence MR imaging of the right ankle was
performed. No intravenous contrast was administered.

[Series 4: T1 · coronal · right · 3.0mm · 0.62mm/px · 7 of 45 slices shown (1 of 2)]
[im 1/45]
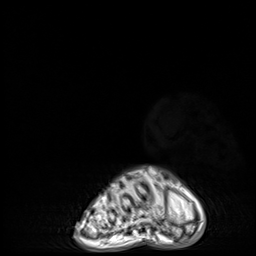
[im 8/45]
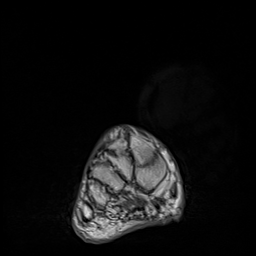
[im 15/45]
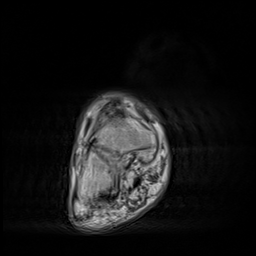
[im 23/45]
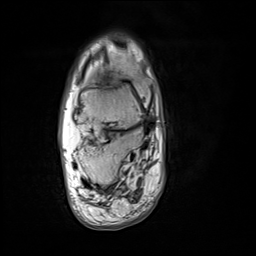
[im 30/45]
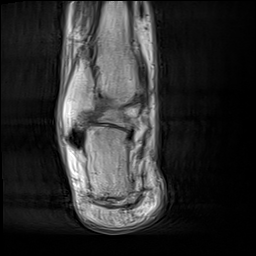
[im 37/45]
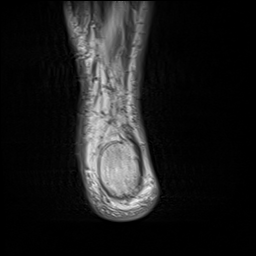
[im 45/45]
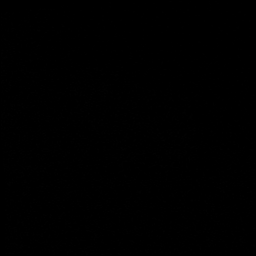

[Series 5: T2 fat-sat · coronal · right · 3.0mm · 0.50mm/px · 8 of 45 slices shown (1 of 3)]
[im 1/45]
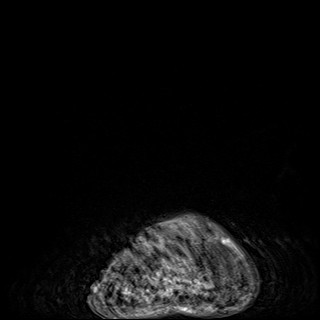
[im 7/45]
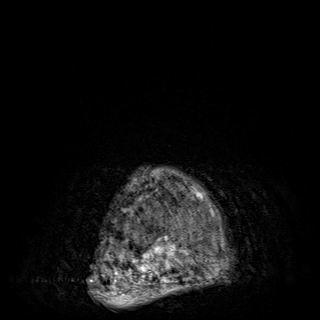
[im 13/45]
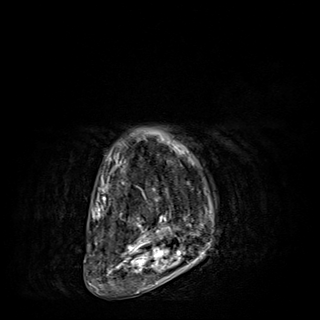
[im 19/45]
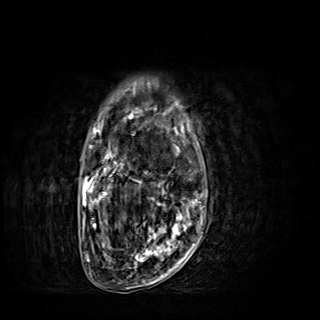
[im 26/45]
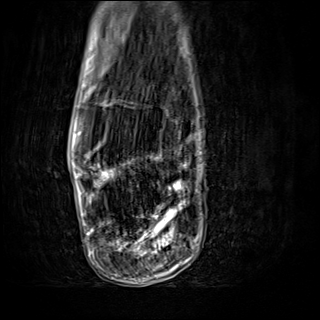
[im 32/45]
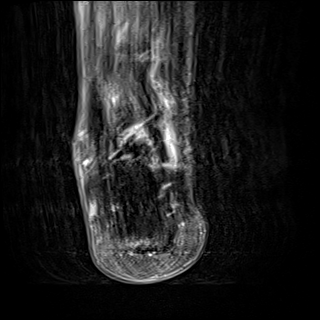
[im 38/45]
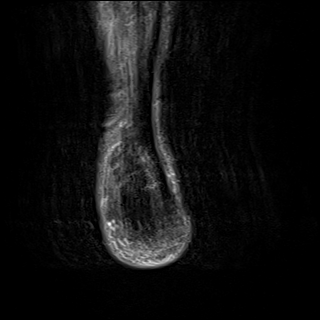
[im 45/45]
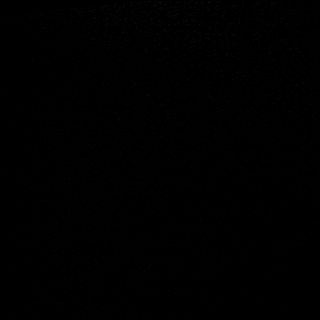

[Series 6: T2 fat-sat · axial · right · 3.0mm · 0.70mm/px · z∈[-42,+63]mm · 6 of 35 slices shown (2 of 3)]
[im 1/35]
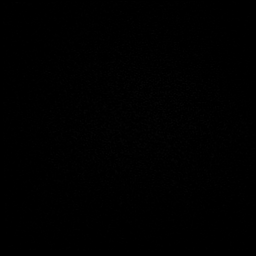
[im 7/35]
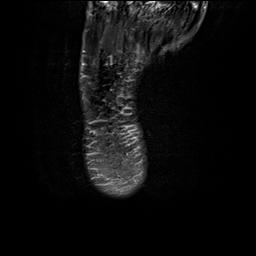
[im 14/35]
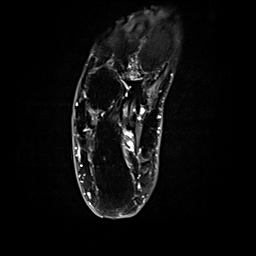
[im 21/35]
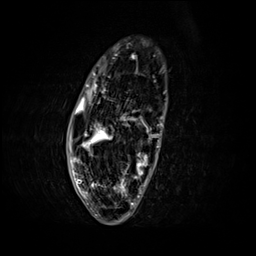
[im 28/35]
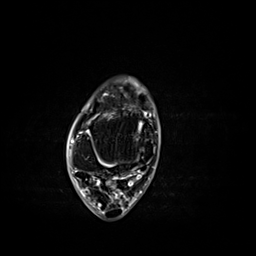
[im 35/35]
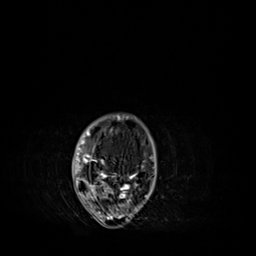

[Series 7: T1 · axial · right · 3.0mm · 0.70mm/px · z∈[-41,+61]mm · 6 of 34 slices shown (2 of 2)]
[im 1/34]
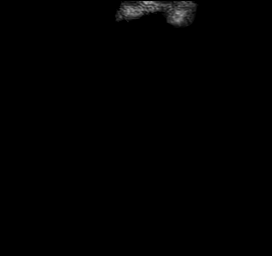
[im 7/34]
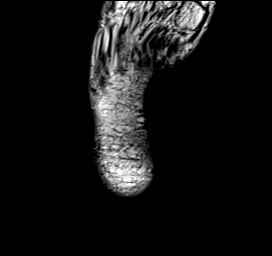
[im 14/34]
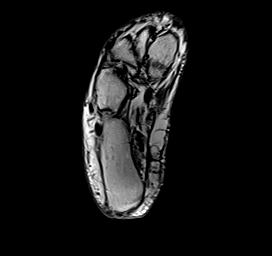
[im 20/34]
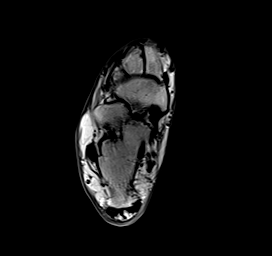
[im 27/34]
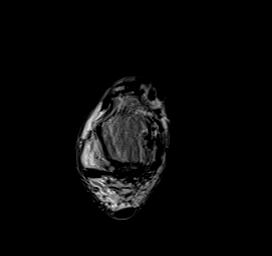
[im 34/34]
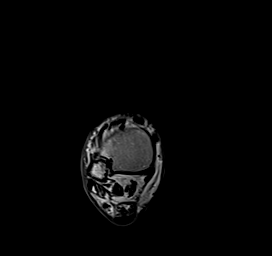

[Series 8: STIR · sagittal · right · 3.0mm · 0.35mm/px · 5 of 27 slices shown]
[im 1/27]
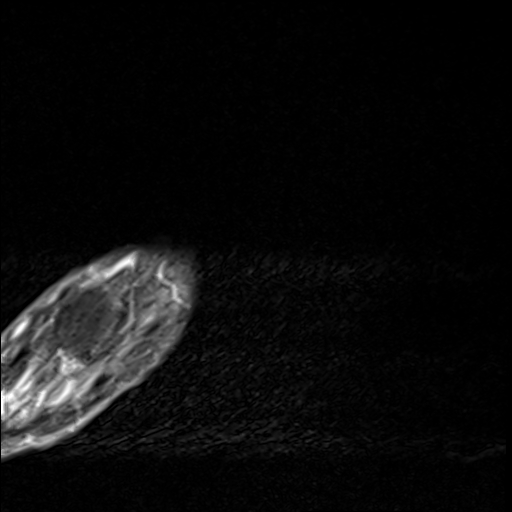
[im 7/27]
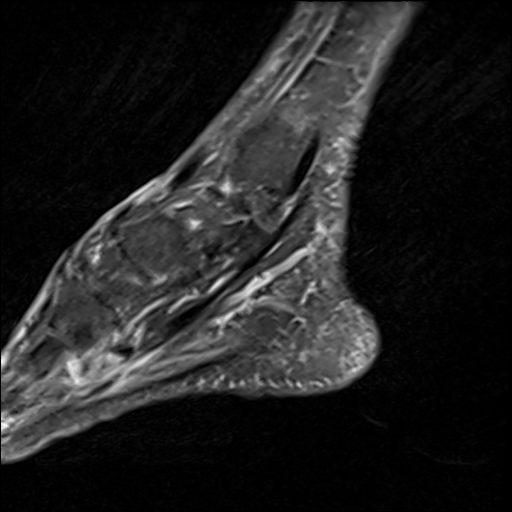
[im 14/27]
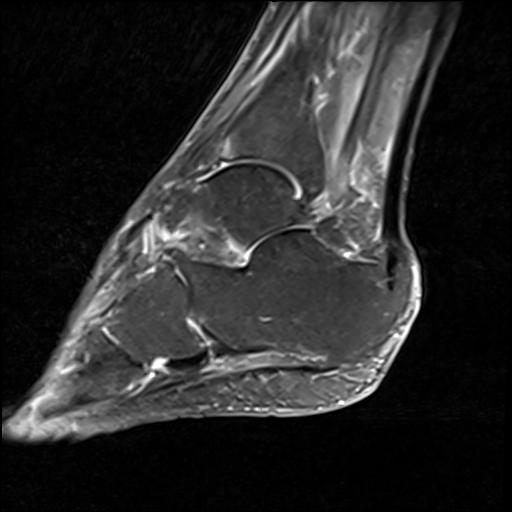
[im 20/27]
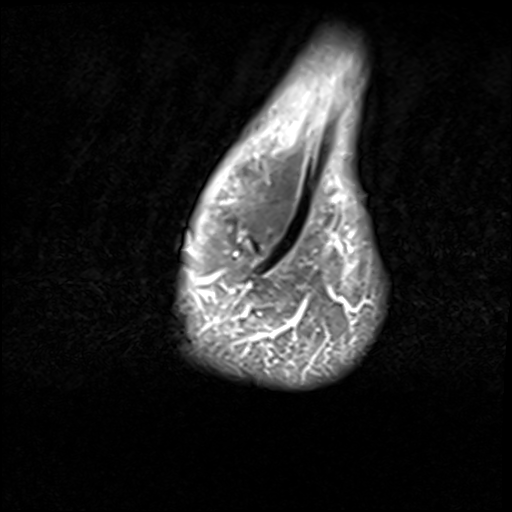
[im 27/27]
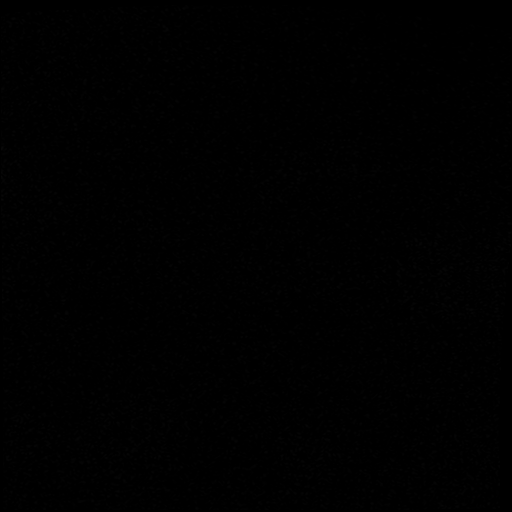

[Series 9: T2 fat-sat · coronal · right · 3.0mm · 0.50mm/px · 8 of 45 slices shown (3 of 3)]
[im 1/45]
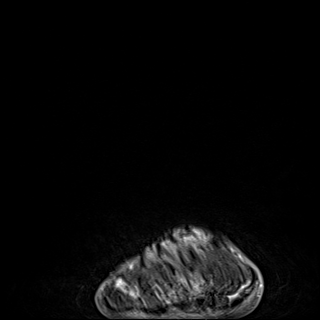
[im 7/45]
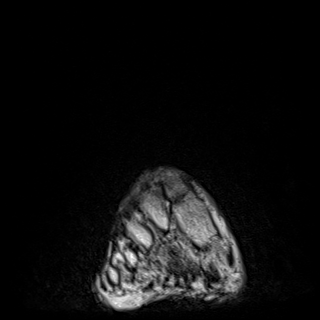
[im 13/45]
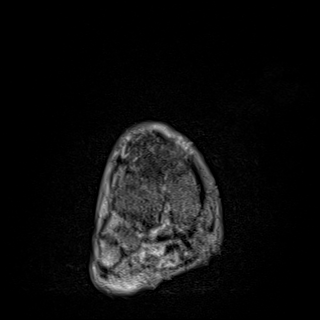
[im 19/45]
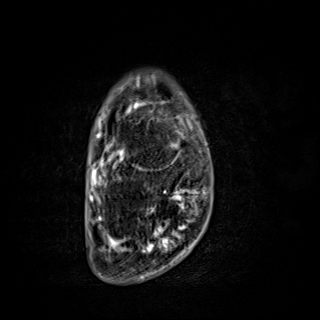
[im 26/45]
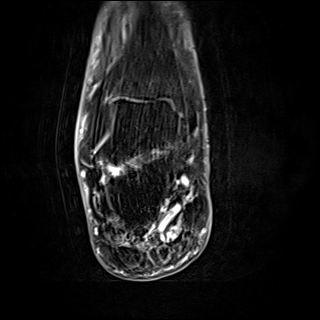
[im 32/45]
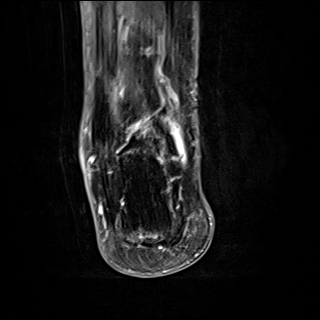
[im 38/45]
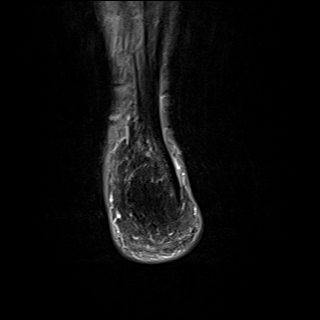
[im 45/45]
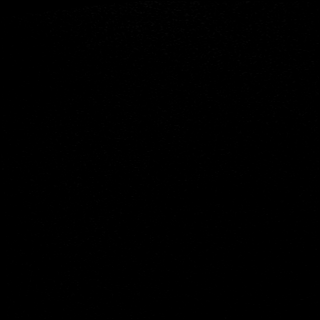

[40 of 40 positions shown; findings below may reference images not displayed]

FINDINGS: Patient motion degrading image quality limiting evaluation.

TENDONS

Peroneal: Peroneal longus tendon intact. Peroneal brevis intact.

Posteromedial: Posterior tibial tendon intact. Flexor hallucis
longus tendon intact. Flexor digitorum longus tendon intact.

Anterior: Tibialis anterior tendon intact. Extensor hallucis longus
tendon intact Extensor digitorum longus tendon intact.

Achilles:  Mild tendinosis of the distal Achilles tendon.

Plantar Fascia: Intact.

LIGAMENTS

Lateral: Anterior talofibular ligament intact. Calcaneofibular
ligament intact. Posterior talofibular ligament intact. Anterior and
posterior tibiofibular ligaments intact.

Medial: Deltoid ligament intact. Spring ligament intact.

CARTILAGE

Ankle Joint: No joint effusion. Partial-thickness cartilage loss of
the tibiotalar joint.

Subtalar Joints/Sinus Tarsi: Normal sinus tarsi. Partial-thickness
cartilage loss of the subtalar joints.

Bones: No marrow signal abnormality. No fracture or dislocation.
Mild osteoarthritis of the talonavicular joint. Osteoarthritis of
the navicular-medial cuneiform joint. Moderate osteoarthritis of the
second, third and fourth tarsometatarsal joints.

Soft Tissue: No fluid collection or hematoma. Muscles are normal
without edema or atrophy. Tarsal tunnel is normal.
IMPRESSION: 1. Mild tendinosis of the distal Achilles tendon.
2. No evidence of osteomyelitis of the calcaneus.
3. Partial-thickness cartilage loss of the tibiotalar joint.
4. Mild osteoarthritis of the talonavicular joint.
5. Osteoarthritis of the navicular-medial cuneiform joint.

## 2020-12-19 MED ORDER — DIPHENHYDRAMINE-ZINC ACETATE 2-0.1 % EX CREA
TOPICAL_CREAM | CUTANEOUS | Status: DC | PRN
  Filled 2020-12-19: qty 28

## 2020-12-19 MED ORDER — ZINC OXIDE 40 % EX OINT
TOPICAL_OINTMENT | Freq: Three times a day (TID) | CUTANEOUS | Status: DC
  Administered 2021-01-02: 1 via TOPICAL
  Filled 2020-12-19 (×5): qty 57

## 2020-12-19 MED ORDER — PIPERACILLIN-TAZOBACTAM 3.375 G IVPB: 3.3750 g | Freq: Three times a day (TID) | INTRAVENOUS | Status: DC

## 2020-12-19 MED ORDER — ZINC OXIDE 12.8 % EX OINT: TOPICAL_OINTMENT | Freq: Three times a day (TID) | CUTANEOUS | Status: DC

## 2020-12-19 MED ORDER — SODIUM ZIRCONIUM CYCLOSILICATE 10 G PO PACK
10.0000 g | PACK | Freq: Every day | ORAL | Status: DC
  Administered 2020-12-19 – 2020-12-20 (×2): 10 g via ORAL
  Filled 2020-12-19 (×2): qty 1

## 2020-12-19 MED ORDER — PIPERACILLIN-TAZOBACTAM 3.375 G IVPB 30 MIN
3.3750 g | Freq: Three times a day (TID) | INTRAVENOUS | Status: DC
  Administered 2020-12-19 (×2): 3.375 g via INTRAVENOUS
  Filled 2020-12-19 (×5): qty 50

## 2020-12-19 NOTE — Progress Notes (Signed)
PROGRESS NOTE  Jacqueline Richmond  DOB: [birth year]  PCP: Bernerd Limbo, MD XNT:[id number]  DOA: 12/18/2020  LOS: 1 day  Hospital Day: 2   Chief Complaint  Patient presents with   leg wound    Brief narrative: Jacqueline Richmond is a 75 y.o. female with PMH significant for DM2, HTN, HLD, CKD stage IV, gout, diverticulosis. Patient was last hospitalized from 6/9-6/14 for wound debridement and application of wound VAC on a previously operated T10 fracture site.  Next 2 months, she was at [facility] and was ultimately discharged to home few weeks ago.  Patient states that while at [facility], she developed wound and ulcer in her right heel. On 9/27, patient presented to podiatry Dr. Pasty Arch office for evaluation of worsening wound on the right heel.  Previously, she was tried on Keflex and doxycycline by her PCP without improvement.   In the podiatrist office, she was noted to have redness around the wound with drainage, full-thickness ulceration of the right heel.  There was a a concern of osteomyelitis of the right heel with possible venous insufficiency.  Patient was directed to ED at [facility].    In the ED, patient was afebrile, hemodynamically stable Labs with WBC count 10.1, lactic acid 1.1, hemoglobin 10.4, potassium 5.9, creatinine elevated to 3.5 compared to baseline of 2.1 from 10 days ago X-rays of right tibia-fibula and foot were obtained.  It did not show any evidence of osteomyelitis. For further work-up and management, patient was transferred to hospitalist service at [facility].  Subjective: Patient was seen and examined this morning.  Pleasant elderly Caucasian female.  Lying in bed.  Not in distress.  Husband at bedside.  Assessment/Plan: Right foot cellulitis and heel ulcer. -Clinically concerning for osteomyelitis.  X-ray did not confirm it.  Pending MRI.   -Blood culture was sent.  Currently on IV vancomycin and IV Zosyn. -Podiatry and wound care following. -ABI was ordered  as well to look for any evidence of peripheral artery disease.  AKI on CKD 4 -Baseline creatinine 2.1 on 9/16. -Present with creatinine elevated to 3.5.  With IV hydration, creatinine seems to be better today.  Continue IV fluid.  Follows up with nephrologist Dr. Posey Pronto as an outpatient. Recent Labs    08/08/20 0056 08/30/20 1339 08/30/20 2057 08/31/20 0507 09/01/20 1514 09/29/20 0000 10/17/20 0000 12/03/20 1044 12/07/20 1322 12/18/20 1323 12/19/20 0532  BUN 62* 52*  --  47* 43* 56* 33* 48* 64* 106* 110*  CREATININE 1.53* 3.30* 3.15* 3.13* 2.67* 1.9* 1.7* 1.64* 2.01* 3.50* 2.93*   Hyperkalemia -Potassium level was elevated to 5.9 on admission, Lokelma 1 dose was given.  Potassium slightly better to 5.6 today.  1 more dose of Lokelma ordered today.  If potassium continues to remain elevated, can consider formally consulting Dr. Posey Pronto. Recent Labs  Lab 12/18/20 1323 12/19/20 0532  K 5.9* 5.6*   Type 2 diabetes mellitus -A1c 5.4 on May 2022 -Home meds include Januvia 50 mg daily -Currently on hold.  Continue sliding scale insulin with Accu-Cheks. Recent Labs  Lab 12/18/20 2207 12/19/20 0758 12/19/20 1127  GLUCAP 124* 97 123*    Essential hypertension -Home meds include atenolol 100 mg daily and clonidine 0.2 mg daily. -Currently on atenolol.  Blood pressure in low normal range.  Clonidine remains on hold.  Continue to monitor.    Hyperlipidemia -Does not seem to be on statin.  Continue aspirin.  History of gout -Allopurinol on hold  Microcytic anemia of chronic  disease -No active blood loss.  Hemoglobin stable at this time.  Continue to monitor  Recent Labs    09/11/20 0000 10/17/20 0000 12/07/20 1322 12/18/20 1323 12/19/20 0532  HGB 8.6* 10.5* 11.3* 10.4* 9.6*  MCV  --   --  79.0* 78.7* 79.2*   Mobility: Needs PT eval postprocedure Code Status:   Code Status: Full Code  Nutritional status: Body mass index is 25.2 kg/m.     Diet:  Diet Order              Diet renal with fluid restriction Fluid restriction: 1200 mL Fluid; Room service appropriate? Yes; Fluid consistency: Thin  Diet effective now                  DVT prophylaxis:  heparin injection 5,000 Units Start: 12/18/20 2200   Antimicrobials: IV Zosyn IV vancomycin Fluid: Saline at 75 mill per hour Consultants: Podiatry Family Communication: Husband at bedside  Status is: Inpatient  Remains inpatient appropriate because: May need debridement by podiatry  Dispo: The patient is from: Home              Anticipated d/c is to: Pending PT eval postprocedure              Patient currently is not medically stable to d/c.   Difficult to place patient No     Infusions:   sodium chloride 250 mL (12/18/20 1419)   sodium chloride 75 mL/hr at 12/18/20 1808   piperacillin-tazobactam      Scheduled Meds:  aspirin EC  81 mg Oral Daily   atenolol  100 mg Oral Daily   cloNIDine  0.2 mg Oral BID   heparin  5,000 Units Subcutaneous Q8H   insulin aspart  0-5 Units Subcutaneous QHS   insulin aspart  0-9 Units Subcutaneous TID WC   liver oil-zinc oxide   Topical TID   senna  1 tablet Oral BID   sodium zirconium cyclosilicate  10 g Oral Daily   vancomycin variable dose per unstable renal function (pharmacist dosing)   Does not apply See admin instructions    Antimicrobials: Anti-infectives (From admission, onward)    Start     Dose/Rate Route Frequency Ordered Stop   12/19/20 1400  piperacillin-tazobactam (ZOSYN) IVPB 3.375 g  Status:  Discontinued        3.375 g 12.5 mL/hr over 240 Minutes Intravenous Every 8 hours 12/19/20 0715 12/19/20 0716   12/19/20 1400  piperacillin-tazobactam (ZOSYN) IVPB 3.375 g        3.375 g 100 mL/hr over 30 Minutes Intravenous Every 8 hours 12/19/20 0718     12/18/20 2200  piperacillin-tazobactam (ZOSYN) IVPB 2.25 g  Status:  Discontinued        2.25 g 100 mL/hr over 30 Minutes Intravenous Every 8 hours 12/18/20 1842 12/19/20 0715   12/18/20  1555  vancomycin variable dose per unstable renal function (pharmacist dosing)         Does not apply See admin instructions 12/18/20 1555     12/18/20 1500  vancomycin (VANCOCIN) IVPB 1000 mg/200 mL premix        1,000 mg 200 mL/hr over 60 Minutes Intravenous  Once 12/18/20 1458 12/18/20 1622   12/18/20 1400  piperacillin-tazobactam (ZOSYN) IVPB 3.375 g        3.375 g 100 mL/hr over 30 Minutes Intravenous  Once 12/18/20 1349 12/18/20 1510       PRN meds: sodium chloride, acetaminophen **OR** acetaminophen, bisacodyl, diphenhydrAMINE-zinc acetate,  hydrALAZINE, oxyCODONE   Objective: Vitals:   12/19/20 0817 12/19/20 1201  BP: (!) 122/54 (!) 104/54  Pulse: 77 79  Resp: 17 16  Temp:  98.4 F (36.9 C)  SpO2: 100% 99%    Intake/Output Summary (Last 24 hours) at 12/19/2020 1215 Last data filed at 12/19/2020 1214 Gross per 24 hour  Intake 1566 ml  Output 1050 ml  Net 516 ml   Filed Weights   12/18/20 1239  Weight: 66.6 kg   Weight change:  Body mass index is 25.2 kg/m.   Physical Exam: General exam: Pleasant, elderly Caucasian female.  Not in physical distress Skin: No rashes, lesions or ulcers. HEENT: Atraumatic, normocephalic, no obvious bleeding Lungs: Clear to auscultation bilaterally CVS: Regular rate and rhythm, no murmur GI/Abd soft, nontender, nondistended, bowel sound present CNS: Alert, awake, oriented x3 Psychiatry: Mood appropriate Extremities: Bilateral lower EXTR cellulitis, right more than left with an ulcer on the right heel.  Data Review: I have personally reviewed the laboratory data and studies available.  Recent Labs  Lab 12/18/20 1323 12/19/20 0532  WBC 10.1 11.0*  HGB 10.4* 9.6*  HCT 33.9* 31.2*  MCV 78.7* 79.2*  PLT 313 296   Recent Labs  Lab 12/18/20 1323 12/19/20 0532  NA 137 142  K 5.9* 5.6*  CL 106 112*  CO2 20* 21*  GLUCOSE 157* 101*  BUN 106* 110*  CREATININE 3.50* 2.93*  CALCIUM 10.1 9.4    F/u labs ordered Unresulted  Labs (From admission, onward)     Start     Ordered   12/20/20 0500  Vancomycin, random  Tomorrow morning,   R        12/19/20 0724   12/19/20 0500  Hemoglobin A1c  Tomorrow morning,   R       Comments: To assess prior glycemic control    12/18/20 1740   12/19/20 7564  Basic metabolic panel  Daily,   R      12/18/20 1740   12/19/20 0500  CBC  Daily,   R      12/18/20 1740   12/18/20 1301  Blood culture (routine x 2)  BLOOD CULTURE X 2,   STAT      12/18/20 1301            Signed, Terrilee Croak, MD [facility] 12/19/2020             Mobility: Needs PT eval postprocedure Code Status:   Code Status: Full Code  DVT prophylaxis: Heparin subcu Antimicrobials: Zosyn and vancomycin Fluid: NS at 75 mill per hour  Diet:  Diet Order             Diet renal with fluid restriction Fluid restriction: 1200 mL Fluid; Room service appropriate? Yes; Fluid consistency: Thin  Diet effective now

## 2020-12-19 NOTE — Progress Notes (Signed)
Pharmacy Antibiotic Note  Jacqueline Richmond is a 75 y.o. female admitted on 12/18/2020 with  right foot wound infection .  Patient was started on Keflex and Doxycycline outpatient, but noticed more drainage from the wound.  Advised to present to the ED for concern of osteomyelitis.  Pharmacy consulted to dose vancomycin and zosyn.  Plan: Vancomycin random level ordered for 9/29 AM Adjust zosyn to 3.375g IV q8h (30 min infusion for CrCl < 20 mL/min) Monitor clinical progress, renal function F/U C&S, abx deescalation / LOT   Height: 5\' 4"  (162.6 cm) Weight: 66.6 kg (146 lb 12.8 oz) IBW/kg (Calculated) : 54.7  Temp (24hrs), Avg:97.2 F (36.2 C), Min:96 F (35.6 C), Max:97.8 F (36.6 C)  Recent Labs  Lab 12/18/20 1323 12/19/20 0532  WBC 10.1 11.0*  CREATININE 3.50* 2.93*  LATICACIDVEN 1.1  --      Estimated Creatinine Clearance: 15.8 mL/min (A) (by C-G formula based on SCr of 2.93 mg/dL (H)).    Allergies  Allergen Reactions   Codeine Itching   Iodinated Diagnostic Agents     Other reaction(s): Unknown   Pentazocine Other (See Comments)    Hallucinations   Statins     Antimicrobials this admission: 9/27 1522 vancomycin 1 g IV x 1  9/27 Zosyn  >>    Microbiology results: 9/27 BCx: sent   Thank you for allowing pharmacy to be a part of this patient's care.  Dimple Nanas, PharmD 12/19/2020 7:23 AM

## 2020-12-19 NOTE — Consult Note (Signed)
Reason for Consult: Right leg cellulitis Referring Physician: Dr. Redmond Baseman Jacqueline Richmond is an 75 y.o. female.  HPI: She saw my partner Dr. Jacqualyn Posey in the office yesterday presenting with wound and cellulitis.  Recommended admission for IV antibiotics and MRI and vascular evaluation.  States she feels quite tired  Past Medical History:  Diagnosis Date   Allergy    Anemia    Arthritis    Cataract    biltaerally    CKD (chronic kidney disease) stage 3, GFR 30-59 ml/min ([place]) 08/29/2011   per patient, kidneys monitored by Dr Posey Pronto endocrinologist ,lov was  05-18-2018, "kidney fx stable"   Colon polyp    Complication of anesthesia    Difficult intubation    small mouth opening   Diverticulosis    Gout    HTN (hypertension) 08/29/2011   Hyperlipemia    Psoriasis    Type II or unspecified type diabetes mellitus without mention of complication, not stated as uncontrolled 08/29/2011    Past Surgical History:  Procedure Laterality Date   APPLICATION OF WOUND VAC N/A 08/30/2020   Procedure: APPLICATION OF WOUND VAC;  Surgeon: Consuella Lose, MD;  Location: [facility];  Service: Neurosurgery;  Laterality: N/A;   BREAST LUMPECTOMY WITH NEEDLE LOCALIZATION Right 05/07/2012   Procedure: BREAST LUMPECTOMY WITH NEEDLE LOCALIZATION;  Surgeon: Merrie Roof, MD;  Location: [facility];  Service: General;  Laterality: Right;   CHOLECYSTECTOMY     COLONOSCOPY  2009   [facility]    COLONOSCOPY WITH PROPOFOL N/A 05/20/2018   Procedure: COLONOSCOPY WITH PROPOFOL;  Surgeon: Milus Banister, MD;  Location: [facility];  Service: Endoscopy;  Laterality: N/A;   HERNIA REPAIR     HIP SURGERY     LAMINECTOMY WITH POSTERIOR LATERAL ARTHRODESIS LEVEL 4 N/A 08/01/2020   Procedure: LAMINECTOMY WITH POSTERIOR LATERAL ARTHRODESIS Thoracic nine- Thoracic eleven ;  Surgeon: Consuella Lose, MD;  Location: [facility];  Service: Neurosurgery;  Laterality: N/A;   POLYPECTOMY  05/20/2018   Procedure: POLYPECTOMY;  Surgeon: Milus Banister, MD;  Location: [facility];  Service: Endoscopy;;   RHINOPLASTY     SALPINGOOPHORECTOMY     TOTAL HIP ARTHROPLASTY Right 2004   WOUND EXPLORATION N/A 08/30/2020   Procedure: SUPERFICIALWOUND EXPLORATION, APPLY WOUND VAC;  Surgeon: Consuella Lose, MD;  Location: [facility];  Service: Neurosurgery;  Laterality: N/A;    Family History  Problem Relation Age of Onset   Heart disease Father    Arthritis Sister    Colon cancer Neg Hx    Colon polyps Neg Hx    Esophageal cancer Neg Hx    Stomach cancer Neg Hx    Rectal cancer Neg Hx     Social History:  reports that she has never smoked. She has never used smokeless tobacco. She reports current alcohol use. She reports that she does not use drugs.  Allergies:  Allergies  Allergen Reactions   Codeine Itching   Iodinated Diagnostic Agents     Other reaction(s): Unknown   Pentazocine Other (See Comments)    Hallucinations   Statins     Medications: I have reviewed the patient's current medications.  Results for orders placed or performed during the hospital encounter of 12/18/20 (from the past 48 hour(s))  CBC     Status: Abnormal   Collection Time: 12/18/20  1:23 PM  Result Value Ref Range   WBC 10.1 4.0 - 10.5 K/uL   RBC 4.31 3.87 - 5.11 MIL/uL  Hemoglobin 10.4 (L) 12.0 - 15.0 g/dL   HCT 33.9 (L) 36.0 - 46.0 %   MCV 78.7 (L) 80.0 - 100.0 fL   MCH 24.1 (L) 26.0 - 34.0 pg   MCHC 30.7 30.0 - 36.0 g/dL   RDW 17.0 (H) 11.5 - 15.5 %   Platelets 313 150 - 400 K/uL   nRBC 0.0 0.0 - 0.2 %    Comment: Performed at [facility], [address], [place], [place] [address]  Comprehensive metabolic panel     Status: Abnormal   Collection Time: 12/18/20  1:23 PM  Result Value Ref Range   Sodium 137 135 - 145 mmol/L   Potassium 5.9 (H) 3.5 - 5.1 mmol/L   Chloride 106 98 - 111 mmol/L   CO2 20 (L) 22 - 32 mmol/L   Glucose, Bld 157 (H) 70 - 99 mg/dL    Comment: Glucose reference range applies only to samples  taken after fasting for at least 8 hours.   BUN 106 (H) 8 - 23 mg/dL    Comment: RESULTS CONFIRMED BY MANUAL DILUTION   Creatinine, Ser 3.50 (H) 0.44 - 1.00 mg/dL   Calcium 10.1 8.9 - 10.3 mg/dL   Total Protein 7.7 6.5 - 8.1 g/dL   Albumin 3.5 3.5 - 5.0 g/dL   AST 10 (L) 15 - 41 U/L   ALT 7 0 - 44 U/L   Alkaline Phosphatase 99 38 - 126 U/L   Total Bilirubin 0.3 0.3 - 1.2 mg/dL   GFR, Estimated 13 (L) >60 mL/min    Comment: (NOTE) Calculated using the CKD-EPI Creatinine Equation (2021)    Anion gap 11 5 - 15    Comment: Performed at [facility], [place], [place] [address]  Lactic acid, plasma     Status: None   Collection Time: 12/18/20  1:23 PM  Result Value Ref Range   Lactic Acid, Venous 1.1 0.5 - 1.9 mmol/L    Comment: Performed at [facility], [address], [place], [place] [address]  Resp Panel by RT-PCR (Flu A&B, Covid) Nasopharyngeal Swab     Status: None   Collection Time: 12/18/20  1:23 PM   Specimen: Nasopharyngeal Swab; Nasopharyngeal(NP) swabs in vial transport medium  Result Value Ref Range   SARS Coronavirus 2 by RT PCR NEGATIVE NEGATIVE    Comment: (NOTE) SARS-CoV-2 target nucleic acids are NOT DETECTED.  The SARS-CoV-2 RNA is generally detectable in upper respiratory specimens during the acute phase of infection. The lowest concentration of SARS-CoV-2 viral copies this assay can detect is 138 copies/mL. A negative result does not preclude SARS-Cov-2 infection and should not be used as the sole basis for treatment or other patient management decisions. A negative result may occur with  improper specimen collection/handling, submission of specimen other than nasopharyngeal swab, presence of viral mutation(s) within the areas targeted by this assay, and inadequate number of viral copies(<138 copies/mL). A negative result must be combined with clinical observations, patient history, and  epidemiological information. The expected result is Negative.  Fact Sheet for Patients:  EntrepreneurPulse.com.au  Fact Sheet for Healthcare Providers:  IncredibleEmployment.be  This test is no t yet approved or cleared by the [place] FDA and  has been authorized for detection and/or diagnosis of SARS-CoV-2 by FDA under an Emergency Use Authorization (EUA). This EUA will remain  in effect (meaning this test can be used) for the duration of the COVID-19 declaration under Section 564(b)(1) of the Act, 21  U.S.C.section 360bbb-3(b)(1), unless the authorization is terminated  or revoked sooner.       Influenza A by PCR NEGATIVE NEGATIVE   Influenza B by PCR NEGATIVE NEGATIVE    Comment: (NOTE) The Xpert Xpress SARS-CoV-2/FLU/RSV plus assay is intended as an aid in the diagnosis of influenza from Nasopharyngeal swab specimens and should not be used as a sole basis for treatment. Nasal washings and aspirates are unacceptable for Xpert Xpress SARS-CoV-2/FLU/RSV testing.  Fact Sheet for Patients: EntrepreneurPulse.com.au  Fact Sheet for Healthcare Providers: IncredibleEmployment.be  This test is not yet approved or cleared by the [place] FDA and has been authorized for detection and/or diagnosis of SARS-CoV-2 by FDA under an Emergency Use Authorization (EUA). This EUA will remain in effect (meaning this test can be used) for the duration of the COVID-19 declaration under Section 564(b)(1) of the Act, 21 U.S.C. section 360bbb-3(b)(1), unless the authorization is terminated or revoked.  Performed at [facility], [address], [place], [place] [address]   Glucose, capillary     Status: Abnormal   Collection Time: 12/18/20 10:07 PM  Result Value Ref Range   Glucose-Capillary 124 (H) 70 - 99 mg/dL    Comment: Glucose reference range applies only to samples taken after fasting  for at least 8 hours.  Basic metabolic panel     Status: Abnormal   Collection Time: 12/19/20  5:32 AM  Result Value Ref Range   Sodium 142 135 - 145 mmol/L   Potassium 5.6 (H) 3.5 - 5.1 mmol/L   Chloride 112 (H) 98 - 111 mmol/L   CO2 21 (L) 22 - 32 mmol/L   Glucose, Bld 101 (H) 70 - 99 mg/dL    Comment: Glucose reference range applies only to samples taken after fasting for at least 8 hours.   BUN 110 (H) 8 - 23 mg/dL    Comment: RESULTS CONFIRMED BY MANUAL DILUTION   Creatinine, Ser 2.93 (H) 0.44 - 1.00 mg/dL   Calcium 9.4 8.9 - 10.3 mg/dL   GFR, Estimated 16 (L) >60 mL/min    Comment: (NOTE) Calculated using the CKD-EPI Creatinine Equation (2021)    Anion gap 9 5 - 15    Comment: Performed at [facility], [place] [address], [place], [place] [address]  CBC     Status: Abnormal   Collection Time: 12/19/20  5:32 AM  Result Value Ref Range   WBC 11.0 (H) 4.0 - 10.5 K/uL   RBC 3.94 3.87 - 5.11 MIL/uL   Hemoglobin 9.6 (L) 12.0 - 15.0 g/dL   HCT 31.2 (L) 36.0 - 46.0 %   MCV 79.2 (L) 80.0 - 100.0 fL   MCH 24.4 (L) 26.0 - 34.0 pg   MCHC 30.8 30.0 - 36.0 g/dL   RDW 17.1 (H) 11.5 - 15.5 %   Platelets 296 150 - 400 K/uL   nRBC 0.0 0.0 - 0.2 %    Comment: Performed at [facility], [place] [address] [place], [place] [address]    DG Chest Port 1 View  Result Date: 12/18/2020 CLINICAL DATA:  Weakness.  History of chest trauma in May 22. EXAM: PORTABLE CHEST 1 VIEW COMPARISON:  Chest x-ray 12/03/2020, CT abdomen pelvis 08/01/2020, CT chest 07/31/2020 FINDINGS: The heart and mediastinal contours are unchanged. Aortic calcification. No focal consolidation. No pulmonary edema. Interval decrease in size of a trace to small volume right pleural effusion. No pneumothorax. No acute osseous abnormality. Bilateral shoulder degenerative changes. Surgical hardware of the  lower thoracic spine is again noted. IMPRESSION: 1. Interval decrease in size of a persistent trace  to small volume right pleural effusion. 2.  Aortic Atherosclerosis (ICD10-I70.0). Electronically Signed   By: Iven Finn M.D.   On: 12/18/2020 15:12    Review of Systems  Constitutional:  Positive for malaise/fatigue.  Skin:        Right heel ulcers  All other systems reviewed and are negative. Blood pressure (!) 103/53, pulse 82, temperature 97.8 F (36.6 C), resp. rate 18, height 5\' 4"  (1.626 m), weight 66.6 kg, SpO2 99 %.  Vitals:   12/19/20 0045 12/19/20 0534  BP: (!) 93/50 (!) 103/53  Pulse: 86 82  Resp: 18 18  Temp: (!) 97.5 F (36.4 C) 97.8 F (36.6 C)  SpO2: 99% 99%    General AA&O x3. Normal mood and affect.  Vascular Dorsalis pedis and posterior tibial pulses  absent right  Capillary refill diminished to all digits. Pedal hair growth diminished.  Neurologic Epicritic sensation grossly present.  Dermatologic (Wound) Posterior heel and leg ulcers partial and full-thickness, cellulitis of the leg with erythema and dry scaling skin across both legs  Orthopedic: Motor intact BLE.    Assessment/Plan:  Cellulitis in setting of wound -Imaging: Studies independently reviewed.  MRI ordered to evaluate for any osteomyelitis of calcaneus -Antibiotics: Continue broad-spectrum antibiotics for now -WB Status: WBAT -Wound Care: Wound care consult ordered defer recommendations of dressings to them -Surgical Plan: No definitive surgical plan, will be pending MRI and vascular Testing which is ordered  Criselda Peaches 12/19/2020, 7:38 AM   Best available via secure chat for questions or concerns.

## 2020-12-19 NOTE — Consult Note (Addendum)
WOC Nurse Consult Note: Patient receiving care in [place]. Patient turned self to left side with assistance. Reason for Consult: right heel wound, BLE excoriation, sacral wound Wound type: BLE impacted by venous insufficiency. No wound on sacrum, but there is an area on the right buttock close to the gluteal cleft consistent with a skin tear. Wound bed is pink. The right plantar surface of the heel is boggy and discolored a yellow hue. The discolored area measures 6 cm x 6 cm. Overlying tissue is intact, no drainage. Pressure Injury POA: Yes Measurement: Wound bed: Drainage (amount, consistency, odor)  Periwound: Dressing procedure/placement/frequency: If necessary, moisten dressings on BLE with saline in order to remove without creating further injuries. Place as many Xeroform dressings Kellie Simmering [phone]) to cover all wounds/weeping areas to BLE. Secure with kerlix. Change daily and if soiled. Unfortunately, ABI results for legs are unavailable and the legs cannot be safely compressed at this time.  Podiatry is managing the right heel wound and treatment.  I have ordered Prevalon heel lift boots for the feet; and, desitin for the buttock skin tear.  Monitor the wound area(s) for worsening of condition such as: Signs/symptoms of infection,  Increase in size,  Development of or worsening of odor, Development of pain, or increased pain at the affected locations.  Notify the medical team if any of these develop.  Thank you for the consult.  Discussed plan of care with the patient and bedside nurse.  [place] nurse will not follow at this time.  Please re-consult the [place] team if needed.  Val Riles, RN, MSN, CWOCN, CNS-BC, pager [phone]

## 2020-12-19 NOTE — Progress Notes (Signed)
Pt in route to [facility], unit-5MW, room 21C. Report called to [employer] at 2141. Pt alert and verbally responsive. No c/o pain, no distress noted. Stable. Report given to [facility] at bedside. Husband notified of transfer at this time. Given transfer info as well.

## 2020-12-19 NOTE — Progress Notes (Signed)
ABI exam has been completed.  Preliminary findings discussed with Sonne, RN.   Results can be found under chart review under CV PROC. 12/19/2020 12:06 PM Jacinta Penalver RVT, RDMS

## 2020-12-20 ENCOUNTER — Ambulatory Visit: Payer: Medicare PPO | Admitting: Podiatry

## 2020-12-20 DIAGNOSIS — E875 Hyperkalemia: Secondary | ICD-10-CM | POA: Diagnosis not present

## 2020-12-20 DIAGNOSIS — I872 Venous insufficiency (chronic) (peripheral): Secondary | ICD-10-CM | POA: Diagnosis not present

## 2020-12-20 DIAGNOSIS — E118 Type 2 diabetes mellitus with unspecified complications: Secondary | ICD-10-CM | POA: Diagnosis not present

## 2020-12-20 DIAGNOSIS — S81801A Unspecified open wound, right lower leg, initial encounter: Secondary | ICD-10-CM | POA: Diagnosis not present

## 2020-12-20 DIAGNOSIS — E1151 Type 2 diabetes mellitus with diabetic peripheral angiopathy without gangrene: Secondary | ICD-10-CM | POA: Diagnosis not present

## 2020-12-20 DIAGNOSIS — L03115 Cellulitis of right lower limb: Secondary | ICD-10-CM | POA: Diagnosis not present

## 2020-12-20 LAB — CBC
HCT: 30.6 % — ABNORMAL LOW (ref 36.0–46.0)
Hemoglobin: 9.7 g/dL — ABNORMAL LOW (ref 12.0–15.0)
MCH: 24.9 pg — ABNORMAL LOW (ref 26.0–34.0)
MCHC: 31.7 g/dL (ref 30.0–36.0)
MCV: 78.7 fL — ABNORMAL LOW (ref 80.0–100.0)
Platelets: 273 10*3/uL (ref 150–400)
RBC: 3.89 MIL/uL (ref 3.87–5.11)
RDW: 17.1 % — ABNORMAL HIGH (ref 11.5–15.5)
WBC: 10.2 10*3/uL (ref 4.0–10.5)
nRBC: 0 % (ref 0.0–0.2)

## 2020-12-20 LAB — BASIC METABOLIC PANEL
Anion gap: 9 (ref 5–15)
BUN: 81 mg/dL — ABNORMAL HIGH (ref 8–23)
CO2: 20 mmol/L — ABNORMAL LOW (ref 22–32)
Calcium: 8.9 mg/dL (ref 8.9–10.3)
Chloride: 110 mmol/L (ref 98–111)
Creatinine, Ser: 2.78 mg/dL — ABNORMAL HIGH (ref 0.44–1.00)
GFR, Estimated: 17 mL/min — ABNORMAL LOW (ref >60)
Glucose, Bld: 88 mg/dL (ref 70–99)
Potassium: 5 mmol/L (ref 3.5–5.1)
Sodium: 139 mmol/L (ref 135–145)

## 2020-12-20 LAB — GLUCOSE, CAPILLARY
Glucose-Capillary: 84 mg/dL (ref 70–99)
Glucose-Capillary: 105 mg/dL — ABNORMAL HIGH (ref 70–99)
Glucose-Capillary: 90 mg/dL (ref 70–99)
Glucose-Capillary: 95 mg/dL (ref 70–99)
Glucose-Capillary: 104 mg/dL — ABNORMAL HIGH (ref 70–99)

## 2020-12-20 LAB — HEMOGLOBIN A1C
Hgb A1c MFr Bld: 6.3 % — ABNORMAL HIGH (ref 4.8–5.6)
Mean Plasma Glucose: 134 mg/dL

## 2020-12-20 LAB — VANCOMYCIN, RANDOM: Vancomycin Rm: 8

## 2020-12-20 MED ORDER — PIPERACILLIN-TAZOBACTAM IN DEX 2-0.25 GM/50ML IV SOLN
2.2500 g | Freq: Three times a day (TID) | INTRAVENOUS | Status: DC
  Administered 2020-12-20 – 2020-12-21 (×4): 2.25 g via INTRAVENOUS
  Filled 2020-12-20 (×5): qty 50

## 2020-12-20 MED ORDER — VANCOMYCIN HCL IN DEXTROSE 1-5 GM/200ML-% IV SOLN
1000.0000 mg | Freq: Once | INTRAVENOUS | Status: AC
  Administered 2020-12-20: 1000 mg via INTRAVENOUS
  Filled 2020-12-20: qty 200

## 2020-12-20 NOTE — Progress Notes (Signed)
Pharmacy Antibiotic Note  Jacqueline Richmond is a 75 y.o. female admitted on 12/18/2020 with  right foot wound infection .  Patient was started on Keflex and Doxycycline outpatient, but noticed more drainage from the wound.  Advised to present to the ED for concern of osteomyelitis.  Pharmacy consulted to dose vancomycin and zosyn.  Vancomycin random level 8 mcg/ml (~36 hours post first dose). SCr improved from yesterday  Plan: Vancomycin 1000mg  IV now Adjust zosyn to 2.25g IV q8h  Monitor clinical progress, renal function, cultures and sensitivities Consider another vanc level in 24-48 hrs or starting maintenance dose if SCr continues to improve   Height: 5\' 4"  (162.6 cm) Weight: 66.6 kg (146 lb 12.8 oz) IBW/kg (Calculated) : 54.7  Temp (24hrs), Avg:98.4 F (36.9 C), Min:97.9 F (36.6 C), Max:98.7 F (37.1 C)  Recent Labs  Lab 12/18/20 1323 12/19/20 0532  WBC 10.1 11.0*  CREATININE 3.50* 2.93*  LATICACIDVEN 1.1  --      Estimated Creatinine Clearance: 15.8 mL/min (A) (by C-G formula based on SCr of 2.93 mg/dL (H)).    Allergies  Allergen Reactions   Codeine Itching   Iodinated Diagnostic Agents     Other reaction(s): Unknown   Pentazocine Other (See Comments)    Hallucinations   Statins     Antimicrobials this admission: 9/27 1522 vancomycin 1 g IV x 1  9/27 Zosyn  >>    Microbiology results: 9/27 BCx: ngtd   Thank you for allowing pharmacy to be a part of this patient's care.  Sherlon Handing, PharmD, BCPS Please see amion for complete clinical pharmacist phone list 12/20/2020 6:34 AM

## 2020-12-20 NOTE — Plan of Care (Signed)

## 2020-12-20 NOTE — Progress Notes (Signed)
New Admission Note:   Arrival Method: Arrived from [facility] via [facility] Mental Orientation: Alert and oriented to person,place Telemetry: Box #20 Assessment: Completed Skin: Pls see doc flowsheet IV: Rt AC,Lt AC Pain: 0/10 Tubes: N/A Safety Measures: Safety Fall Prevention Plan has been discussed.  Admission: Completed 5MW Orientation: Patient has been oriented to the room, unit and staff.  Family: none at bedside  Orders have been reviewed and implemented. Will continue to monitor the patient. Call light has been placed within reach and bed alarm has been activated.   Haeley Fordham [employer], RN-BC Phone number: [phone]

## 2020-12-20 NOTE — Consult Note (Addendum)
WOC consult was performed yesterday, refer to previous progress note for assessment and plan of care.  Vascular team has now consulted and requested Una boots be applied instead of previous orders.  Ortho tech notified of need to apply bilat Una boots and change Q Mon and Thurs. Please refer to vascular team for further questions regarding plan of care. Please re-consult if further assistance is needed.  Thank-you,  Julien Girt MSN, [place], [place], [place], [place]

## 2020-12-20 NOTE — Consult Note (Addendum)
[facility] NOTE   MRN : [id number]  Reason for Consult: right heel wound Referring Physician: Hospitalist  History of Present Illness: 75 y/o female followed by Dr. Earleen Newport for right heel wound.  podiatry Dr. Pasty Arch office today for evaluation of worsening wound on the right heel.  Previously, she was tried on Keflex and doxycycline by her PCP without improvement.  Patient is unable to communicate to questions.     Patient was last hospitalized from 6/9-6/14 for wound debridement and application of wound VAC on a previously operated T10 fracture site.  Next 2 months, she was at [facility] and was ultimately discharged to home few weeks ago.  Patient states that while at [facility], she developed wound and ulcer in her right heel.    PMH significant for DM2, HTN, HLD, CKD stage IV, gout, diverticulosis   Current Facility-Administered Medications  Medication Dose Route Frequency Provider Last Rate Last Admin   0.9 %  sodium chloride infusion   Intravenous PRN Lajean Saver, MD 10 mL/hr at 12/18/20 1419 250 mL at 12/18/20 1419   0.9 %  sodium chloride infusion   Intravenous Continuous Dahal, Marlowe Aschoff, MD 75 mL/hr at 12/19/20 2258 New Bag at 12/19/20 2258   acetaminophen (TYLENOL) tablet 650 mg  650 mg Oral Q6H PRN Terrilee Croak, MD   650 mg at 12/18/20 1934   Or   acetaminophen (TYLENOL) suppository 650 mg  650 mg Rectal Q6H PRN Dahal, Marlowe Aschoff, MD       aspirin EC tablet 81 mg  81 mg Oral Daily Dahal, Binaya, MD   81 mg at 12/19/20 1050   atenolol (TENORMIN) tablet 100 mg  100 mg Oral Daily Dahal, Marlowe Aschoff, MD   100 mg at 12/19/20 3875   bisacodyl (DULCOLAX) suppository 10 mg  10 mg Rectal Daily PRN Dahal, Marlowe Aschoff, MD       cloNIDine (CATAPRES) tablet 0.2 mg  0.2 mg Oral BID Dahal, Marlowe Aschoff, MD   0.2 mg at 12/19/20 2120   diphenhydrAMINE-zinc acetate (BENADRYL) 2-0.1 % cream   Topical PRN Kathryne Eriksson, NP       heparin injection 5,000 Units  5,000 Units  Subcutaneous Q8H Dahal, Marlowe Aschoff, MD   5,000 Units at 12/20/20 0546   hydrALAZINE (APRESOLINE) injection 10 mg  10 mg Intravenous Q6H PRN Dahal, Binaya, MD       insulin aspart (novoLOG) injection 0-5 Units  0-5 Units Subcutaneous QHS Dahal, Binaya, MD       insulin aspart (novoLOG) injection 0-9 Units  0-9 Units Subcutaneous TID WC Dahal, Marlowe Aschoff, MD   1 Units at 12/19/20 1210   liver oil-zinc oxide (DESITIN) 40 % ointment   Topical TID Terrilee Croak, MD   Given at 12/19/20 2122   oxyCODONE (Oxy IR/ROXICODONE) immediate release tablet 5 mg  5 mg Oral Q4H PRN Dahal, Marlowe Aschoff, MD   5 mg at 12/19/20 2141   piperacillin-tazobactam (ZOSYN) IVPB 2.25 g  2.25 g Intravenous Q8H Franky Macho, RPH       senna (SENOKOT) tablet 8.6 mg  1 tablet Oral BID Dahal, Marlowe Aschoff, MD   8.6 mg at 12/19/20 2120   sodium zirconium cyclosilicate (LOKELMA) packet 10 g  10 g Oral Daily Dahal, Binaya, MD   10 g at 12/19/20 1049   vancomycin (VANCOCIN) IVPB 1000 mg/200 mL premix  1,000 mg Intravenous Once Franky Macho, RPH       vancomycin variable dose per unstable renal function (pharmacist dosing)  Does not apply See admin instructions Rumbarger, Valeda Malm, RPH        Pt meds include: Statin :No Betablocker: YES ASA: Yes Other anticoagulants/antiplatelets: admitted on Eliquis-on hold currently  Past Medical History:  Diagnosis Date   Allergy    Anemia    Arthritis    Cataract    biltaerally    CKD (chronic kidney disease) stage 3, GFR 30-59 ml/min ([place]) 08/29/2011   per patient, kidneys monitored by Dr Posey Pronto endocrinologist ,lov was  05-18-2018, "kidney fx stable"   Colon polyp    Complication of anesthesia    Difficult intubation    small mouth opening   Diverticulosis    Gout    HTN (hypertension) 08/29/2011   Hyperlipemia    Psoriasis    Type II or unspecified type diabetes mellitus without mention of complication, not stated as uncontrolled 08/29/2011    Past Surgical History:  Procedure Laterality Date    APPLICATION OF WOUND VAC N/A 08/30/2020   Procedure: APPLICATION OF WOUND VAC;  Surgeon: Consuella Lose, MD;  Location: [place];  Service: Neurosurgery;  Laterality: N/A;   BREAST LUMPECTOMY WITH NEEDLE LOCALIZATION Right 05/07/2012   Procedure: BREAST LUMPECTOMY WITH NEEDLE LOCALIZATION;  Surgeon: Merrie Roof, MD;  Location: [place];  Service: General;  Laterality: Right;   CHOLECYSTECTOMY     COLONOSCOPY  2009   [place]    COLONOSCOPY WITH PROPOFOL N/A 05/20/2018   Procedure: COLONOSCOPY WITH PROPOFOL;  Surgeon: Milus Banister, MD;  Location: [facility];  Service: Endoscopy;  Laterality: N/A;   HERNIA REPAIR     HIP SURGERY     LAMINECTOMY WITH POSTERIOR LATERAL ARTHRODESIS LEVEL 4 N/A 08/01/2020   Procedure: LAMINECTOMY WITH POSTERIOR LATERAL ARTHRODESIS Thoracic nine- Thoracic eleven ;  Surgeon: Consuella Lose, MD;  Location: [place];  Service: Neurosurgery;  Laterality: N/A;   POLYPECTOMY  05/20/2018   Procedure: POLYPECTOMY;  Surgeon: Milus Banister, MD;  Location: [facility];  Service: Endoscopy;;   RHINOPLASTY     SALPINGOOPHORECTOMY     TOTAL HIP ARTHROPLASTY Right 2004   WOUND EXPLORATION N/A 08/30/2020   Procedure: SUPERFICIALWOUND EXPLORATION, APPLY WOUND VAC;  Surgeon: Consuella Lose, MD;  Location: [place];  Service: Neurosurgery;  Laterality: N/A;    Social History Social History   Tobacco Use   Smoking status: Never   Smokeless tobacco: Never  Vaping Use   Vaping Use: Never used  Substance Use Topics   Alcohol use: Yes    Comment: Drinks on Special Occasions   Drug use: No    Family History Family History  Problem Relation Age of Onset   Heart disease Father    Arthritis Sister    Colon cancer Neg Hx    Colon polyps Neg Hx    Esophageal cancer Neg Hx    Stomach cancer Neg Hx    Rectal cancer Neg Hx     Allergies  Allergen Reactions   Codeine Itching   Iodinated Diagnostic Agents     Other reaction(s): Unknown   Pentazocine Other (See  Comments)    Hallucinations   Statins      REVIEW OF SYSTEMS  General: [ ]  Weight loss, [ ]  Fever, [ ]  chills Neurologic: [ ]  Dizziness, [ ]  Blackouts, [ ]  Seizure [ ]  Stroke, [ ]  "Mini stroke", [ ]  Slurred speech, [ ]  Temporary blindness; [ ]  weakness in arms or legs, [ ]  Hoarseness [ ]  Dysphagia Cardiac: [ ]  Chest pain/pressure, [ ]   Shortness of breath at rest [ ]  Shortness of breath with exertion, [ ]  Atrial fibrillation or irregular heartbeat  Vascular: [ ]  Pain in legs with walking, [ ]  Pain in legs at rest, [ ]  Pain in legs at night,  [ ]  Non-healing ulcer, [ ]  Blood clot in vein/DVT,   Pulmonary: [ ]  Home oxygen, [ ]  Productive cough, [ ]  Coughing up blood, [ ]  Asthma,  [ ]  Wheezing [ ]  COPD Musculoskeletal:  [ ]  Arthritis, [ ]  Low back pain, [ ]  Joint pain Hematologic: [ ]  Easy Bruising, [ ]  Anemia; [ ]  Hepatitis Gastrointestinal: [ ]  Blood in stool, [ ]  Gastroesophageal Reflux/heartburn, Urinary: [ ]  chronic Kidney disease, [ ]  on HD - [ ]  MWF or [ ]  TTHS, [ ]  Burning with urination, [ ]  Difficulty urinating Skin: [ ]  Rashes, [ ]  Wounds Psychological: [ ]  Anxiety, [ ]  Depression  Physical Examination Vitals:   12/19/20 1931 12/19/20 2300 12/20/20 0249 12/20/20 0634  BP: 133/61 123/61 128/63 (!) 129/57  Pulse: 90 90 84 89  Resp: 18 17 18 19   Temp: 98.7 F (37.1 C) 98.5 F (36.9 C) 97.9 F (36.6 C) 98.4 F (36.9 C)  TempSrc:  Oral Oral Oral  SpO2: 99% 98% 99% 99%  Weight:      Height:       Body mass index is 25.2 kg/m.  General:  WDWN in NA HENT: WNL Eyes: Pupils equal Pulmonary: normal non-labored breathing , without Rales, rhonchi,  wheezing Cardiac: RRR, without  Murmurs, rubs or gallops; No carotid bruits Abdomen: soft, NT, no masses Skin: no rashes, ulcers noted;  no Gangrene , no cellulitis; no open wounds;           Right heel pressure ulcer, lateral malleolus,erythema motor intact with helping lifting leg Left LE venous skin  changes  Musculoskeletal: no muscle wasting or atrophy; no edema  Neurologic: A&O X 3; Appropriate Affect ;  SENSATION: normal;  Speech is fluent/normal, clear but unable to follow conversatio   Significant Diagnostic Studies: CBC Lab Results  Component Value Date   WBC 11.0 (H) 12/19/2020   HGB 9.6 (L) 12/19/2020   HCT 31.2 (L) 12/19/2020   MCV 79.2 (L) 12/19/2020   PLT 296 12/19/2020    BMET    Component Value Date/Time   NA 142 12/19/2020 0532   NA 138 10/17/2020 0000   K 5.6 (H) 12/19/2020 0532   CL 112 (H) 12/19/2020 0532   CO2 21 (L) 12/19/2020 0532   GLUCOSE 101 (H) 12/19/2020 0532   BUN 110 (H) 12/19/2020 0532   BUN 33 (A) 10/17/2020 0000   CREATININE 2.93 (H) 12/19/2020 0532   CALCIUM 9.4 12/19/2020 0532   GFRNONAA 16 (L) 12/19/2020 0532   GFRAA 33.44 10/17/2020 0000   Estimated Creatinine Clearance: 15.8 mL/min (A) (by C-G formula based on SCr of 2.93 mg/dL (H)).  COAG Lab Results  Component Value Date   INR 1.04 05/01/2017   INR 1.15 08/30/2011     Non-Invasive Vascular Imaging:  MRI right heel IMPRESSION: 1. Mild tendinosis of the distal Achilles tendon. 2. No evidence of osteomyelitis of the calcaneus. 3. Partial-thickness cartilage loss of the tibiotalar joint. 4. Mild osteoarthritis of the talonavicular joint. 5. Osteoarthritis of the navicular-medial cuneiform joint.       ABI Findings:  +---------+------------------+-----+----------+--------+  Right    Rt Pressure (mmHg)IndexWaveform  Comment   +---------+------------------+-----+----------+--------+  Brachial 105  triphasic           +---------+------------------+-----+----------+--------+  PTA                             monophasic>254      +---------+------------------+-----+----------+--------+  DP                              monophasic>254      +---------+------------------+-----+----------+--------+  Great Toe                        Absent              +---------+------------------+-----+----------+--------+   +---------+------------------+-----+----------+-------+  Left     Lt Pressure (mmHg)IndexWaveform  Comment  +---------+------------------+-----+----------+-------+  Brachial 110                    triphasic          +---------+------------------+-----+----------+-------+  PTA                             monophasic>254     +---------+------------------+-----+----------+-------+  DP                              monophasic>254     +---------+------------------+-----+----------+-------+  Great Toe39                0.35 Abnormal  Severe   +---------+------------------+-----+----------+-------+   +-------+-----------+-----------+------------+------------+  ABI/TBIToday's ABIToday's TBIPrevious ABIPrevious TBI  +-------+-----------+-----------+------------+------------+  Right  [place]         0                                    +-------+-----------+-----------+------------+------------+  Left   [place]         0.35                                 +-------+-----------+-----------+------------+------------+      Arterial wall calcification precludes accurate ankle pressures and ABIs.     Summary:  Right: Resting right ankle-brachial index indicates noncompressible right  lower extremity arteries and monophasic waveforms. The right toe-brachial  index is abnormal (absence of flow).   Left: Resting left ankle-brachial index indicates noncompressible left  lower extremity arteries and monophasic waveforms. The left toe-brachial  index is abnormal (severe small vessel disease).   ASSESSMENT/PLAN:  Venous disease mixed with PAD The wounds appear venous with right heel pressure ulcer Her ABI's show evidence of chronic PAD with absent TBI on the right,  ad DM is likely a significant factor.  We will place both LE in una boots for compression to assist healing of the lower  leg wounds.  I'm not sure if she is ambulatory or not.  Dr. Stanford Breed will try to talk with family.      Roxy Horseman 12/20/2020 7:26 AM   VASCULAR STAFF ADDENDUM: I have independently interviewed and examined the patient. I agree with the above.  75YF with delirium and right decubitus heel ulcer with venous ulceration. She is confused and cannot answer questions for [place]. No palpable pedal pulses. ABIs reviewed - non compressible tibial arteries. Toe pressures suggest PAD. Not a  good revascularization candidate. Will plan unna boot therapy for venous ulcerations. Continue to offload the heel. Follow up with [facility] in clinic in 1 month for wound checks. If wounds deteriorating will consider angiography at that time.  Yevonne Aline. Stanford Breed, MD [facility] Phone Number: [phone] 12/20/2020 8:12 AM

## 2020-12-20 NOTE — Progress Notes (Signed)
PROGRESS NOTE    Jacqueline Richmond  XQJ:[id number] DOB: [birth year] DOA: 12/18/2020 PCP: Bernerd Limbo, MD   Chief Complaint  Patient presents with   leg wound   Brief Narrative/Hospital Course: 75 old female with history of T2DM, HTN, HLD, CKD stage IV, gout, diverticulosis recently hospitalized 6/9-08/2012 for wound debridement and application of wound VAC on a previously operated T10 fracture site, has been in [place] [facility] to for 2 months and ultimately discharged home few weeks ago. Patient states that while at [facility], she developed wound and ulcer in her right heel.On 9/27, patient presented to podiatry Dr. Pasty Arch office for evaluation of worsening wound on the right heel.  Previously, she was tried on Keflex and doxycycline by her PCP without improvement.   In the podiatrist office, she was noted to have redness around the wound with drainage, full-thickness ulceration of the right heel.  There was a a concern of osteomyelitis of the right heel with possible venous insufficiency.  Patient was directed to ED at [facility].    In the ED, patient was afebrile, hemodynamically stable Labs with WBC count 10.1, lactic acid 1.1, hemoglobin 10.4, potassium 5.9, creatinine elevated to 3.5 compared to baseline of 2.1 from 10 days ago X-rays of right tibia-fibula and foot were obtained.  It did not show any evidence of osteomyelitis. For further work-up and management, patient was transferred to hospitalist service at [facility]. Patient underwent ABI today showed significant finding, discussed with Dr. Luan Pulling from vascular and transferred from [facility] to [facility] for further evaluation  Subjective: Seen/examined this morning Alert,awake able to tell me her name and DOB otherwise unable to say where she is at and current date or current president. Acknowledges she has leg pain. On RA, she is not in distress.  Assessment & Plan:  Right decubitus heel ulcer with venous ulceration/cellulitis: In the  setting of PAD based on ABI.  Noncompressible tibial arteries on ABI.  Vascular surgeon input appreciated not a good revascularization candidate.  Advised to continue Unna boot therapy- WC following,has venous ulceration, offload the heel and follow-up with previous clinic in 1 month for wound check.  If deteriorating wounds we will consider angiography at that time.  Fortunately no evidence of osteomyelitis on MRI he will.  On vancomycin and Zosyn, de-escalate soon to p.o. antibiotics await further podiatry input, blood culture NGTD. Recent Labs  Lab 12/18/20 1323 12/19/20 0532 12/20/20 0451  WBC 10.1 11.0* 10.2  LATICACIDVEN 1.1  --   --     Chronic appearing PAD based on ABI with absent flow on tibial arteries: On aspirin 81 mg.  Statin intolerance.  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 Cont supportive care, PTOT. Per husband sometime she is "goofy sometime"  Falls/Generalized weakness Hx of fall and back injusry T10 fracture in May 10 and was hospitalized then to SNF for a month. PTOT eval.  AKI on CKD stage IV Metabolic acidosis due to CKD Baseline creat 2.19/16 peaked at 3.5 on admission now downtrending.  Continue gentle IV fluid hydration followed by Dr. Posey Pronto as outpatient.  consider sodium bicarb p.o., Bmp in am Recent Labs    08/30/20 1339 08/30/20 2057 08/31/20 0507 09/01/20 1514 09/29/20 0000 10/17/20 0000 12/03/20 1044 12/07/20 1322 12/18/20 1323 12/19/20 0532 12/20/20 0451  BUN 52*  --  47* 43* 56* 33* 48* 64* 106* 110* 81*  CREATININE 3.30* 3.15* 3.13* 2.67* 1.9* 1.7* 1.64* 2.01* 3.50*  2.93* 2.78*   Hyperkalemia Improved to 5.0, s/p Lokelma/ change to renal diet. bmp in am Recent Labs  Lab 12/18/20 1323 12/19/20 0532 12/20/20 0451  K 5.9* 5.6* 5.0   T2DM stable A1c 5.45/22  Hold home Januvia, keep on sliding scale blood sugar is stable Recent Labs  Lab  12/19/20 0758 12/19/20 1127 12/19/20 1659 12/19/20 2127 12/20/20 0636  GLUCAP 97 123* 91 107* 84   Essential hypertension:Well-controlled,continue home atenolol, clonidine   HLD-statin intolerance  History of gout-Allopurinol on hold  Microcytic anemia suspect chronic disease/CKD related H&H is stable.monitor Recent Labs    10/17/20 0000 12/07/20 1322 12/18/20 1323 12/19/20 0532 12/20/20 0451  HGB 10.5* 11.3* 10.4* 9.6* 9.7*  MCV  --  79.0* 78.7* 79.2* 78.7*   Pressure Injury 12/19/20 Vertebral column Mid Deep Tissue Pressure Injury - Purple or maroon localized area of discolored intact skin or blood-filled blister due to damage of underlying soft tissue from pressure and/or shear. (Active)  12/19/20 2254  Location: Vertebral column  Location Orientation: Mid  Staging: Deep Tissue Pressure Injury - Purple or maroon localized area of discolored intact skin or blood-filled blister due to damage of underlying soft tissue from pressure and/or shear.  Wound Description (Comments):   Present on Admission: Yes   Diet Order             Diet renal with fluid restriction Fluid restriction: 1200 mL Fluid; Room service appropriate? Yes; Fluid consistency: Thin  Diet effective now                   DVT prophylaxis: heparin injection 5,000 Units Start: 12/18/20 2200 Code Status:   Code Status: Full Code  Family Communication: plan of care discussed with patient at bedside. Spoke w/ husband and refers to daughter in law for her care who will decide on SNF.  Status is: Inpatient Remains inpatient appropriate because:IV treatments appropriate due to intensity of illness or inability to take PO and Inpatient level of care appropriate due to severity of illness Dispo: The patient is from: Home              Anticipated d/c is to:  tbd              Patient currently is not medically stable to d/c.   Difficult to place patient No  Objective: Vitals: Today's Vitals   12/19/20 2300  12/20/20 0249 12/20/20 0634 12/20/20 0749  BP: 123/61 128/63 (!) 129/57   Pulse: 90 84 89   Resp: 17 18 19    Temp: 98.5 F (36.9 C) 97.9 F (36.6 C) 98.4 F (36.9 C)   TempSrc: Oral Oral Oral   SpO2: 98% 99% 99%   Weight:      Height:      PainSc: 0-No pain   0-No pain   Physical Examination: General exam: AAO X1-2, weak,older than stated age. HEENT:Oral mucosa moist, Ear/Nose WNL grossly,dentition normal. Respiratory system: B/l CLEAR BS, no use of accessory muscle, non tender. Cardiovascular system: S1 & S2 +,No JVD. Gastrointestinal system: Abdomen soft, NT,ND, BS+. Nervous System:Alert, awake, moving extremities. Extremities: Leg edema + b/l with erythema RLE > LLE- see pic, distal peripheral pulses palpable.  Skin: No rashes, no icterus. MSK: Normal muscle bulk,tone, power.  Medications reviewed:  Scheduled Meds:  aspirin EC  81 mg Oral Daily   atenolol  100 mg Oral Daily   cloNIDine  0.2 mg Oral BID   heparin  5,000 Units Subcutaneous Q8H  insulin aspart  0-5 Units Subcutaneous QHS   insulin aspart  0-9 Units Subcutaneous TID WC   liver oil-zinc oxide   Topical TID   senna  1 tablet Oral BID   sodium zirconium cyclosilicate  10 g Oral Daily   vancomycin variable dose per unstable renal function (pharmacist dosing)   Does not apply See admin instructions   Continuous Infusions:  sodium chloride 250 mL (12/18/20 1419)   sodium chloride 75 mL/hr at 12/20/20 0847   piperacillin-tazobactam (ZOSYN)  IV 100 mL/hr at 12/20/20 0847   vancomycin      Intake/Output  Intake/Output Summary (Last 24 hours) at 12/20/2020 0847 Last data filed at 12/20/2020 0847 Gross per 24 hour  Intake 2727.15 ml  Output 1300 ml  Net 1427.15 ml   Intake/Output from previous day: 09/28 0701 - 09/29 0700 In: 2317.9 [P.O.:356; I.V.:1861.9; IV Piggyback:100] Out: 2458 [Urine:1750] Net IO Since Admission: 1,765.15 mL [12/20/20 0847]   Weight change:   Wt Readings from Last 3 Encounters:   12/18/20 66.6 kg  12/03/20 66.6 kg  10/18/20 68.2 kg     Consultants:see note  Procedures:see note Antimicrobials: Anti-infectives (From admission, onward)    Start     Dose/Rate Route Frequency Ordered Stop   12/20/20 0730  vancomycin (VANCOCIN) IVPB 1000 mg/200 mL premix        1,000 mg 200 mL/hr over 60 Minutes Intravenous  Once 12/20/20 0637     12/20/20 0730  piperacillin-tazobactam (ZOSYN) IVPB 2.25 g        2.25 g 100 mL/hr over 30 Minutes Intravenous Every 8 hours 12/20/20 0638     12/19/20 1400  piperacillin-tazobactam (ZOSYN) IVPB 3.375 g  Status:  Discontinued        3.375 g 12.5 mL/hr over 240 Minutes Intravenous Every 8 hours 12/19/20 0715 12/19/20 0716   12/19/20 1400  piperacillin-tazobactam (ZOSYN) IVPB 3.375 g  Status:  Discontinued        3.375 g 100 mL/hr over 30 Minutes Intravenous Every 8 hours 12/19/20 0718 12/20/20 0638   12/18/20 2200  piperacillin-tazobactam (ZOSYN) IVPB 2.25 g  Status:  Discontinued        2.25 g 100 mL/hr over 30 Minutes Intravenous Every 8 hours 12/18/20 1842 12/19/20 0715   12/18/20 1555  vancomycin variable dose per unstable renal function (pharmacist dosing)         Does not apply See admin instructions 12/18/20 1555     12/18/20 1500  vancomycin (VANCOCIN) IVPB 1000 mg/200 mL premix        1,000 mg 200 mL/hr over 60 Minutes Intravenous  Once 12/18/20 1458 12/18/20 1622   12/18/20 1400  piperacillin-tazobactam (ZOSYN) IVPB 3.375 g        3.375 g 100 mL/hr over 30 Minutes Intravenous  Once 12/18/20 1349 12/18/20 1510      Culture/Microbiology    Component Value Date/Time   SDES  12/18/2020 1325    RIGHT ANTECUBITAL Performed at [facility], [address], [place], [place] [address]    [facility]  12/18/2020 1325    Blood Culture adequate volume Performed at [facility], [address], [place], [place] [address]    CULT  12/18/2020 1325    NO GROWTH < 24 HOURS Performed at  [facility], [place] [address], [place], [place] [address]    REPTSTATUS PENDING 12/18/2020 1325    Other culture-see note  Unresulted Labs (From admission, onward)     Start  Ordered   12/19/20 1610  Basic metabolic panel  Daily,   R      12/18/20 1740   12/19/20 0500  CBC  Daily,   R      12/18/20 1740   12/18/20 1301  Blood culture (routine x 2)  BLOOD CULTURE X 2,   STAT (with TIMED occurrences)      12/18/20 1301            Data Reviewed: I have personally reviewed following labs and imaging studies CBC: Recent Labs  Lab 12/18/20 1323 12/19/20 0532  WBC 10.1 11.0*  HGB 10.4* 9.6*  HCT 33.9* 31.2*  MCV 78.7* 79.2*  PLT 313 960   Basic Metabolic Panel: Recent Labs  Lab 12/18/20 1323 12/19/20 0532  NA 137 142  K 5.9* 5.6*  CL 106 112*  CO2 20* 21*  GLUCOSE 157* 101*  BUN 106* 110*  CREATININE 3.50* 2.93*  CALCIUM 10.1 9.4   GFR: Estimated Creatinine Clearance: 15.8 mL/min (A) (by C-G formula based on SCr of 2.93 mg/dL (H)). Liver Function Tests: Recent Labs  Lab 12/18/20 1323  AST 10*  ALT 7  ALKPHOS 99  BILITOT 0.3  PROT 7.7  ALBUMIN 3.5   No results for input(s): LIPASE, AMYLASE in the last 168 hours. No results for input(s): AMMONIA in the last 168 hours. Coagulation Profile: No results for input(s): INR, PROTIME in the last 168 hours. Cardiac Enzymes: No results for input(s): CKTOTAL, CKMB, CKMBINDEX, TROPONINI in the last 168 hours. BNP (last 3 results) No results for input(s): PROBNP in the last 8760 hours. HbA1C: Recent Labs    12/19/20 0532  HGBA1C 6.3*   CBG: Recent Labs  Lab 12/19/20 0758 12/19/20 1127 12/19/20 1659 12/19/20 2127 12/20/20 0636  GLUCAP 97 123* 91 107* 84   Lipid Profile: No results for input(s): CHOL, HDL, LDLCALC, TRIG, CHOLHDL, LDLDIRECT in the last 72 hours. Thyroid Function Tests: No results for input(s): TSH, T4TOTAL, FREET4, T3FREE, THYROIDAB in the last 72 hours. Anemia Panel: No results  for input(s): VITAMINB12, FOLATE, FERRITIN, TIBC, IRON, RETICCTPCT in the last 72 hours. Sepsis Labs: Recent Labs  Lab 12/18/20 1323  LATICACIDVEN 1.1    Recent Results (from the past 240 hour(s))  Resp Panel by RT-PCR (Flu A&B, Covid) Nasopharyngeal Swab     Status: None   Collection Time: 12/18/20  1:23 PM   Specimen: Nasopharyngeal Swab; Nasopharyngeal(NP) swabs in vial transport medium  Result Value Ref Range Status   SARS Coronavirus 2 by RT PCR NEGATIVE NEGATIVE Final    Comment: (NOTE) SARS-CoV-2 target nucleic acids are NOT DETECTED.  The SARS-CoV-2 RNA is generally detectable in upper respiratory specimens during the acute phase of infection. The lowest concentration of SARS-CoV-2 viral copies this assay can detect is 138 copies/mL. A negative result does not preclude SARS-Cov-2 infection and should not be used as the sole basis for treatment or other patient management decisions. A negative result may occur with  improper specimen collection/handling, submission of specimen other than nasopharyngeal swab, presence of viral mutation(s) within the areas targeted by this assay, and inadequate number of viral copies(<138 copies/mL). A negative result must be combined with clinical observations, patient history, and epidemiological information. The expected result is Negative.  Fact Sheet for Patients:  EntrepreneurPulse.com.au  Fact Sheet for Healthcare Providers:  IncredibleEmployment.be  This test is no t yet approved or cleared by the [place] FDA and  has been authorized for detection and/or diagnosis of SARS-CoV-2 by FDA  under an Emergency Use Authorization (EUA). This EUA will remain  in effect (meaning this test can be used) for the duration of the COVID-19 declaration under Section 564(b)(1) of the Act, 21 U.S.C.section 360bbb-3(b)(1), unless the authorization is terminated  or revoked sooner.       Influenza A by PCR  NEGATIVE NEGATIVE Final   Influenza B by PCR NEGATIVE NEGATIVE Final    Comment: (NOTE) The Xpert Xpress SARS-CoV-2/FLU/RSV plus assay is intended as an aid in the diagnosis of influenza from Nasopharyngeal swab specimens and should not be used as a sole basis for treatment. Nasal washings and aspirates are unacceptable for Xpert Xpress SARS-CoV-2/FLU/RSV testing.  Fact Sheet for Patients: EntrepreneurPulse.com.au  Fact Sheet for Healthcare Providers: IncredibleEmployment.be  This test is not yet approved or cleared by the [place] FDA and has been authorized for detection and/or diagnosis of SARS-CoV-2 by FDA under an Emergency Use Authorization (EUA). This EUA will remain in effect (meaning this test can be used) for the duration of the COVID-19 declaration under Section 564(b)(1) of the Act, 21 U.S.C. section 360bbb-3(b)(1), unless the authorization is terminated or revoked.  Performed at [facility], [address] [place], [place] [address]   Blood culture (routine x 2)     Status: None (Preliminary result)   Collection Time: 12/18/20  1:25 PM   Specimen: Right Antecubital; Blood  Result Value Ref Range Status   Specimen Description   Final    RIGHT ANTECUBITAL Performed at [facility], [address] [place], [place] [address]    Special Requests   Final    Blood Culture adequate volume Performed at [facility], [address], [place], [place] [address]    Culture   Final    NO GROWTH < 24 HOURS Performed at [facility], [place] [address], [place], [place] [address]    Report Status PENDING  Incomplete     Radiology Studies: MR HEEL RIGHT WO CONTRAST  Result Date: 12/19/2020 CLINICAL DATA:  Foot swelling and pain. EXAM: MR OF THE RIGHT HEEL WITHOUT CONTRAST TECHNIQUE: Multiplanar, multisequence MR imaging of the right ankle was performed. No intravenous  contrast was administered. COMPARISON:  None. FINDINGS: Patient motion degrading image quality limiting evaluation. TENDONS Peroneal: Peroneal longus tendon intact. Peroneal brevis intact. Posteromedial: Posterior tibial tendon intact. Flexor hallucis longus tendon intact. Flexor digitorum longus tendon intact. Anterior: Tibialis anterior tendon intact. Extensor hallucis longus tendon intact Extensor digitorum longus tendon intact. Achilles:  Mild tendinosis of the distal Achilles tendon. Plantar Fascia: Intact. LIGAMENTS Lateral: Anterior talofibular ligament intact. Calcaneofibular ligament intact. Posterior talofibular ligament intact. Anterior and posterior tibiofibular ligaments intact. Medial: Deltoid ligament intact. Spring ligament intact. CARTILAGE Ankle Joint: No joint effusion. Partial-thickness cartilage loss of the tibiotalar joint. Subtalar Joints/Sinus Tarsi: Normal sinus tarsi. Partial-thickness cartilage loss of the subtalar joints. Bones: No marrow signal abnormality. No fracture or dislocation. Mild osteoarthritis of the talonavicular joint. Osteoarthritis of the navicular-medial cuneiform joint. Moderate osteoarthritis of the second, third and fourth tarsometatarsal joints. Soft Tissue: No fluid collection or hematoma. Muscles are normal without edema or atrophy. Tarsal tunnel is normal. IMPRESSION: 1. Mild tendinosis of the distal Achilles tendon. 2. No evidence of osteomyelitis of the calcaneus. 3. Partial-thickness cartilage loss of the tibiotalar joint. 4. Mild osteoarthritis of the talonavicular joint. 5. Osteoarthritis of the navicular-medial cuneiform joint. Electronically Signed   By: Kathreen Devoid M.D.   On: 12/19/2020 21:18   DG Chest [facility] [address]  Result Date: 12/18/2020 CLINICAL DATA:  Weakness.  History of chest trauma in May 22. EXAM: PORTABLE CHEST 1 VIEW COMPARISON:  Chest x-ray 12/03/2020, CT abdomen pelvis 08/01/2020, CT chest 07/31/2020 FINDINGS: The heart and mediastinal  contours are unchanged. Aortic calcification. No focal consolidation. No pulmonary edema. Interval decrease in size of a trace to small volume right pleural effusion. No pneumothorax. No acute osseous abnormality. Bilateral shoulder degenerative changes. Surgical hardware of the lower thoracic spine is again noted. IMPRESSION: 1. Interval decrease in size of a persistent trace to small volume right pleural effusion. 2.  Aortic Atherosclerosis (ICD10-I70.0). Electronically Signed   By: Iven Finn M.D.   On: 12/18/2020 15:12   VAS [place] ABI WITH/WO TBI  Result Date: 12/19/2020  LOWER EXTREMITY DOPPLER STUDY Patient Name:  ATARAH CADOGAN  Date of Exam:   12/19/2020 Medical Rec #: [id number]      Accession #:    [id number] Date of Birth: Jul 15, 1945       Patient Gender: F Patient Age:   80 years Exam Location:  [facility] Procedure:      VAS [place] ABI WITH/WO TBI Referring Phys: Terrilee Croak --------------------------------------------------------------------------------  Indications: Ulceration, and cellulitis. High Risk Factors: Hypertension, hyperlipidemia, Diabetes, no history of                    smoking. Other Factors: CKD3.  Comparison Study: No previous exams Performing Technologist: Hill, Jody RVT, RDMS  Examination Guidelines: A complete evaluation includes at minimum, Doppler waveform signals and systolic blood pressure reading at the level of bilateral brachial, anterior tibial, and posterior tibial arteries, when vessel segments are accessible. Bilateral testing is considered an integral part of a complete examination. Photoelectric Plethysmograph (PPG) waveforms and toe systolic pressure readings are included as required and additional duplex testing as needed. Limited examinations for reoccurring indications may be performed as noted.  ABI Findings: +---------+------------------+-----+----------+--------+ Right    Rt Pressure (mmHg)IndexWaveform  Comment   +---------+------------------+-----+----------+--------+ Brachial 105                    triphasic          +---------+------------------+-----+----------+--------+ PTA                             monophasic>254     +---------+------------------+-----+----------+--------+ DP                              monophasic>254     +---------+------------------+-----+----------+--------+ Great Toe                       Absent             +---------+------------------+-----+----------+--------+ +---------+------------------+-----+----------+-------+ Left     Lt Pressure (mmHg)IndexWaveform  Comment +---------+------------------+-----+----------+-------+ Brachial 110                    triphasic         +---------+------------------+-----+----------+-------+ PTA                             monophasic>254    +---------+------------------+-----+----------+-------+ DP                              monophasic>254    +---------+------------------+-----+----------+-------+ Pollie Meyer  0.35 Abnormal  Severe  +---------+------------------+-----+----------+-------+ +-------+-----------+-----------+------------+------------+ ABI/TBIToday's ABIToday's TBIPrevious ABIPrevious TBI +-------+-----------+-----------+------------+------------+ Right  [place]         0                                   +-------+-----------+-----------+------------+------------+ Left   [place]         0.35                                +-------+-----------+-----------+------------+------------+ Arterial wall calcification precludes accurate ankle pressures and ABIs.  Summary: Right: Resting right ankle-brachial index indicates noncompressible right lower extremity arteries and monophasic waveforms. The right toe-brachial index is abnormal (absence of flow). Left: Resting left ankle-brachial index indicates noncompressible left lower extremity arteries and monophasic waveforms. The left  toe-brachial index is abnormal (severe small vessel disease).  *See table(s) above for measurements and observations.  Vascular consult recommended. Electronically signed by Jamelle Haring on 12/19/2020 at 5:25:23 PM.    Final      LOS: 2 days   Antonieta Pert, MD [facility]  12/20/2020, 8:47 AM

## 2020-12-20 NOTE — Progress Notes (Signed)
Orthopedic Tech Progress Note Patient Details:  Jacqueline Richmond October 26, 1945 [id number]  Ortho Devices Type of Ortho Device: Louretta Parma boot Ortho Device/Splint Location: Bi LE Ortho Device/Splint Interventions: Application   Post Interventions Patient Tolerated: Well  Linus Salmons Tyrelle Raczka 12/20/2020, 4:35 PM

## 2020-12-20 NOTE — Progress Notes (Signed)
  Subjective:  Patient ID: Jacqueline Richmond, female    DOB: September 06, 1945,  MRN: [id number]  She is doing okay says her legs and heel still hurt Objective:   Vitals:   12/20/20 1637 12/20/20 2116  BP: (!) 142/71 (!) 158/95  Pulse: 87 92  Resp: 18 15  Temp: 98.2 F (36.8 C) 98.1 F (36.7 C)  SpO2: 98% 99%   General AA&O x3. Normal mood and affect.  Vascular Dorsalis pedis and posterior tibial pulses 2/4 bilat. Brisk capillary refill to all digits. Pedal hair present.  Neurologic Epicritic sensation grossly intact.  Dermatologic Legs wrapped in Unna boot style  Orthopedic: MMT 5/5     Assessment & Plan:  Patient was evaluated and treated and all questions answered.  Bilateral leg ulcerations and posterior heel ulceration -Vascular surgery plans for no intervention during the admission and likely will follow-up in 1 month -MRI reviewed there is no evidence of osteomyelitis. -Recommend continue local wound care. -Follow-up at the wound care center following discharge  Criselda Peaches, DPM  Accessible via secure chat for questions or concerns.

## 2020-12-21 ENCOUNTER — Inpatient Hospital Stay (HOSPITAL_COMMUNITY): Payer: Medicare Other

## 2020-12-21 DIAGNOSIS — E875 Hyperkalemia: Secondary | ICD-10-CM | POA: Diagnosis not present

## 2020-12-21 DIAGNOSIS — E1151 Type 2 diabetes mellitus with diabetic peripheral angiopathy without gangrene: Secondary | ICD-10-CM | POA: Diagnosis not present

## 2020-12-21 DIAGNOSIS — I872 Venous insufficiency (chronic) (peripheral): Secondary | ICD-10-CM | POA: Diagnosis not present

## 2020-12-21 LAB — BASIC METABOLIC PANEL
Anion gap: 9 (ref 5–15)
BUN: 60 mg/dL — ABNORMAL HIGH (ref 8–23)
CO2: 20 mmol/L — ABNORMAL LOW (ref 22–32)
Calcium: 9 mg/dL (ref 8.9–10.3)
Chloride: 112 mmol/L — ABNORMAL HIGH (ref 98–111)
Creatinine, Ser: 2.24 mg/dL — ABNORMAL HIGH (ref 0.44–1.00)
GFR, Estimated: 22 mL/min — ABNORMAL LOW (ref >60)
Glucose, Bld: 114 mg/dL — ABNORMAL HIGH (ref 70–99)
Potassium: 4.2 mmol/L (ref 3.5–5.1)
Sodium: 141 mmol/L (ref 135–145)

## 2020-12-21 LAB — GLUCOSE, CAPILLARY
Glucose-Capillary: 108 mg/dL — ABNORMAL HIGH (ref 70–99)
Glucose-Capillary: 97 mg/dL (ref 70–99)
Glucose-Capillary: 105 mg/dL — ABNORMAL HIGH (ref 70–99)
Glucose-Capillary: 84 mg/dL (ref 70–99)

## 2020-12-21 LAB — AMMONIA: Ammonia: 15 umol/L (ref 9–35)

## 2020-12-21 LAB — URINALYSIS, ROUTINE W REFLEX MICROSCOPIC
Bacteria, UA: NONE SEEN
Bilirubin Urine: NEGATIVE
Glucose, UA: NEGATIVE mg/dL
Hgb urine dipstick: NEGATIVE
Ketones, ur: NEGATIVE mg/dL
Leukocytes,Ua: NEGATIVE
Nitrite: NEGATIVE
Protein, ur: 30 mg/dL — AB
Specific Gravity, Urine: 1.012 (ref 1.005–1.030)
pH: 5 (ref 5.0–8.0)

## 2020-12-21 LAB — CBC
HCT: 34 % — ABNORMAL LOW (ref 36.0–46.0)
Hemoglobin: 10.5 g/dL — ABNORMAL LOW (ref 12.0–15.0)
MCH: 24.5 pg — ABNORMAL LOW (ref 26.0–34.0)
MCHC: 30.9 g/dL (ref 30.0–36.0)
MCV: 79.4 fL — ABNORMAL LOW (ref 80.0–100.0)
Platelets: 290 10*3/uL (ref 150–400)
RBC: 4.28 MIL/uL (ref 3.87–5.11)
RDW: 17.2 % — ABNORMAL HIGH (ref 11.5–15.5)
WBC: 11.1 10*3/uL — ABNORMAL HIGH (ref 4.0–10.5)
nRBC: 0 % (ref 0.0–0.2)

## 2020-12-21 LAB — VITAMIN B12: Vitamin B-12: 744 pg/mL (ref 180–914)

## 2020-12-21 LAB — TSH: TSH: 1.576 u[IU]/mL (ref 0.350–4.500)

## 2020-12-21 IMAGING — MR MR HEAD W/O CM
8 of 11 series · 27 of 48 positions shown · non-contrast
Comparison: [DATE]

CLINICAL DATA: Delirium.  Expressive aphasia.

EXAM:
MRI HEAD WITHOUT CONTRAST
TECHNIQUE: Multiplanar, multiecho pulse sequences of the brain and surrounding
structures were obtained without intravenous contrast.

[Series 2: DWI · axial · 3.0mm · 0.94mm/px · z∈[-46,+98]mm · 8 of 97 slices shown (1 of 2)]
[im 1/97]
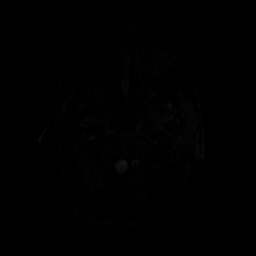
[im 14/97]
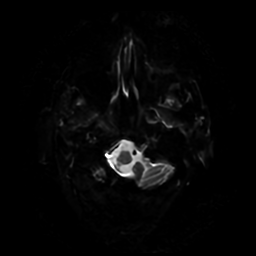
[im 28/97]
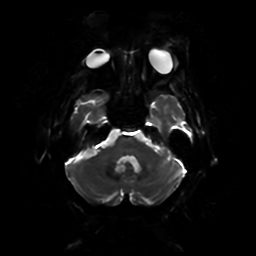
[im 42/97]
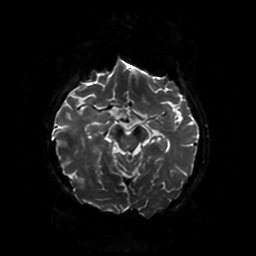
[im 55/97]
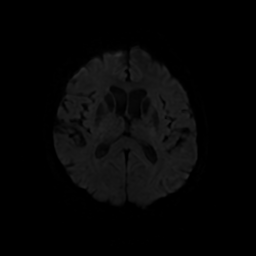
[im 69/97]
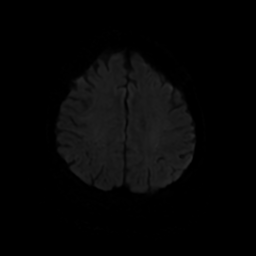
[im 83/97]
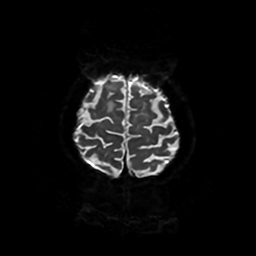
[im 97/97]
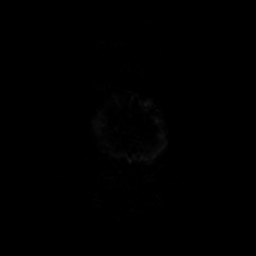

[Series 3: DWI · coronal · 4.0mm · 0.94mm/px · 5 of 59 slices shown (2 of 2)]
[im 1/59]
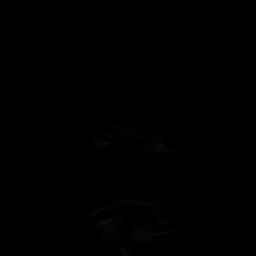
[im 15/59]
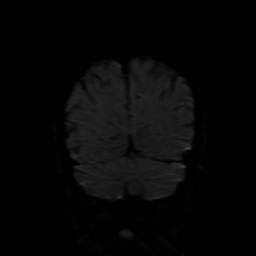
[im 30/59]
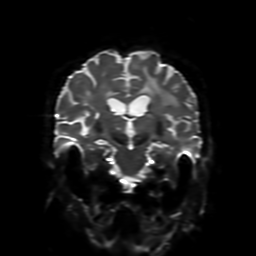
[im 44/59]
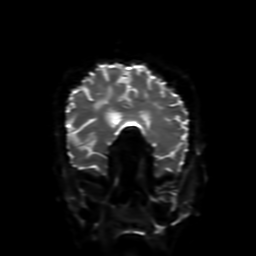
[im 59/59]
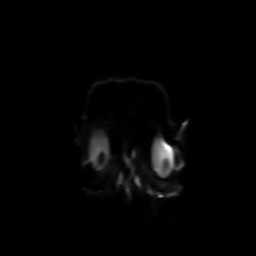

[Series 3: T2 · coronal · 5.0mm · 0.47mm/px · 2 of 28 slices shown (1 of 2)]
[im 1/28]
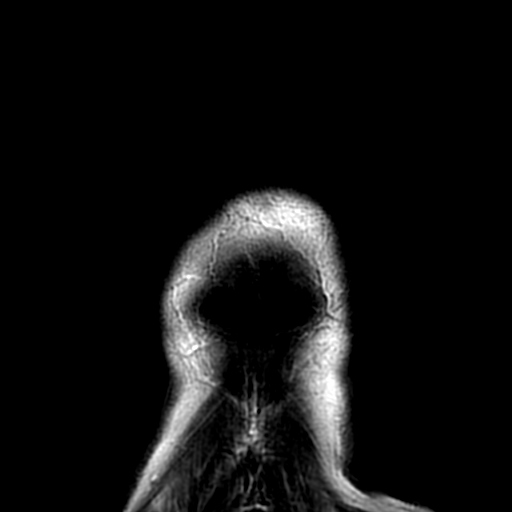
[im 28/28]
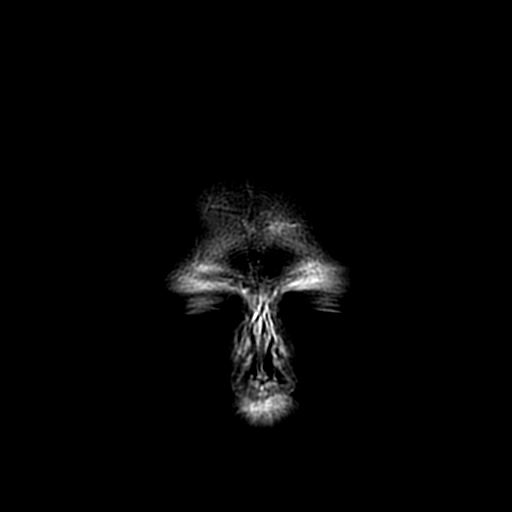

[Series 6: FLAIR · axial · 4.0mm · 0.45mm/px · z∈[-47,+100]mm · 3 of 35 slices shown (1 of 2)]
[im 1/35]
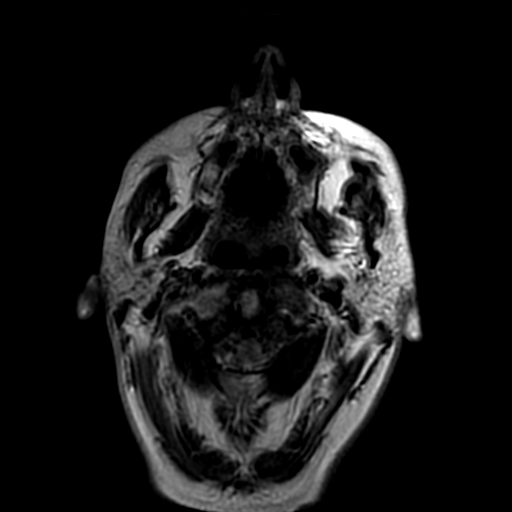
[im 18/35]
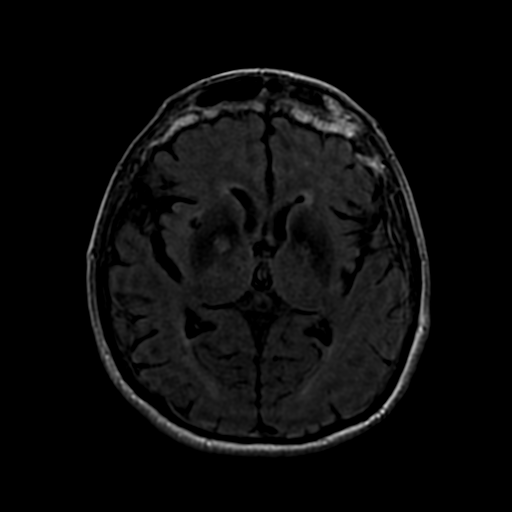
[im 35/35]
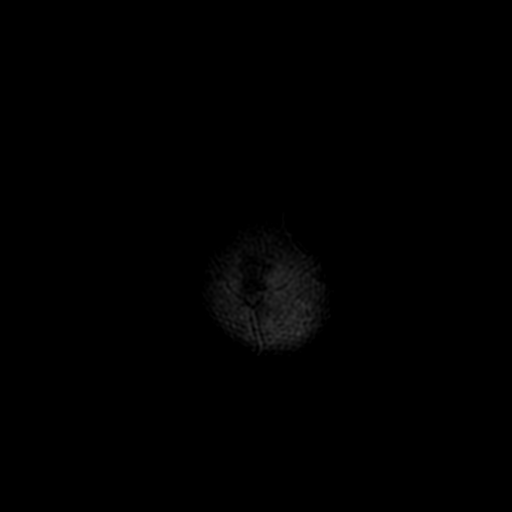

[Series 8: T2 · axial · 5.0mm · 0.47mm/px · 1 of 26 slices shown (2 of 2)]
[im 1/26]
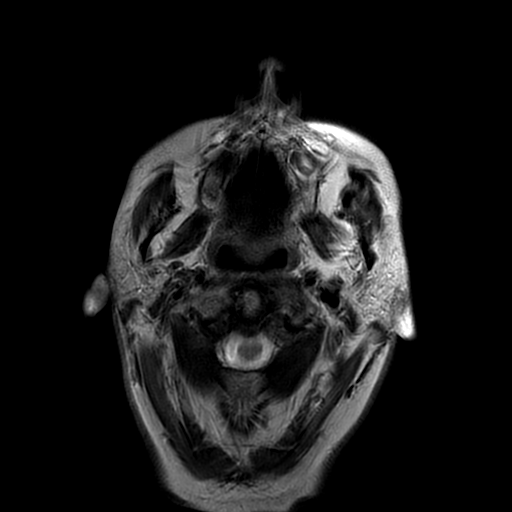

[Series 9: FLAIR · sagittal · 5.0mm · 0.23mm/px · 2 of 24 slices shown (2 of 2)]
[im 1/24]
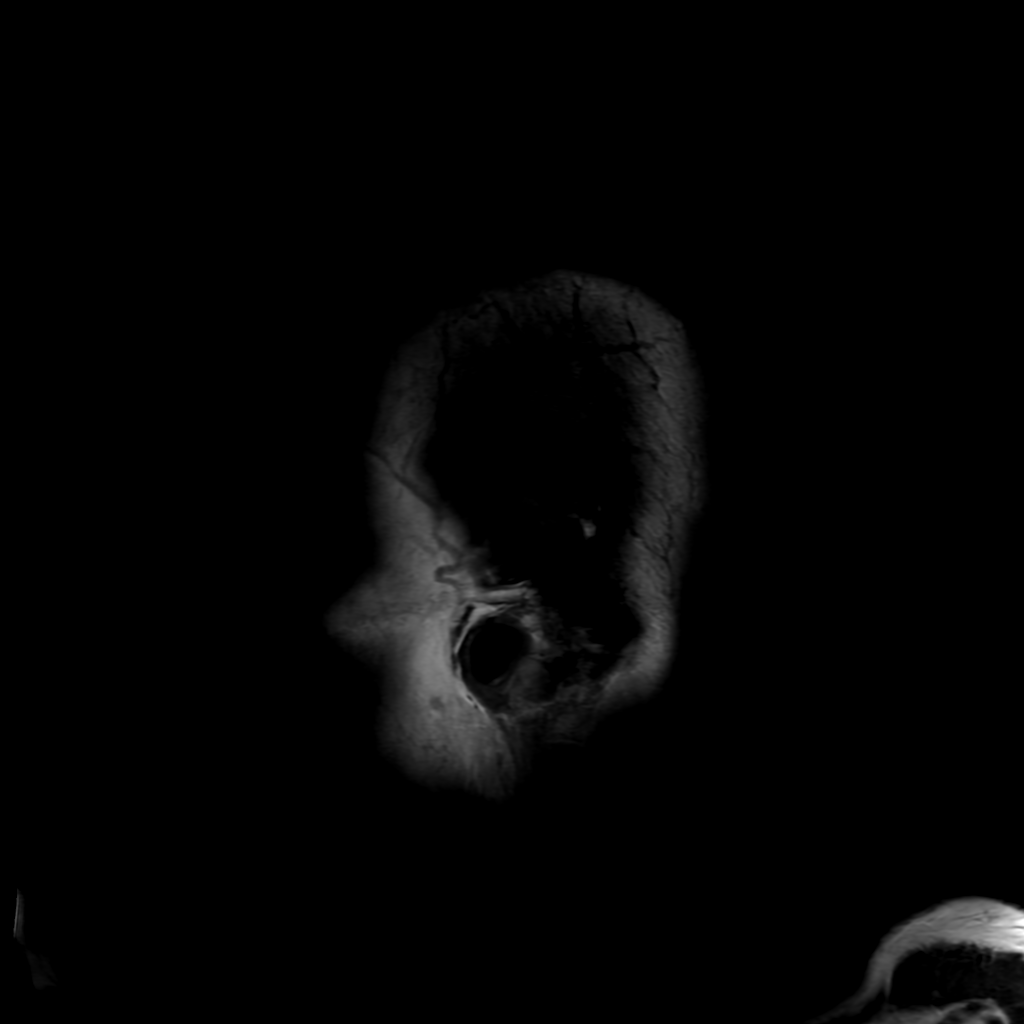
[im 24/24]
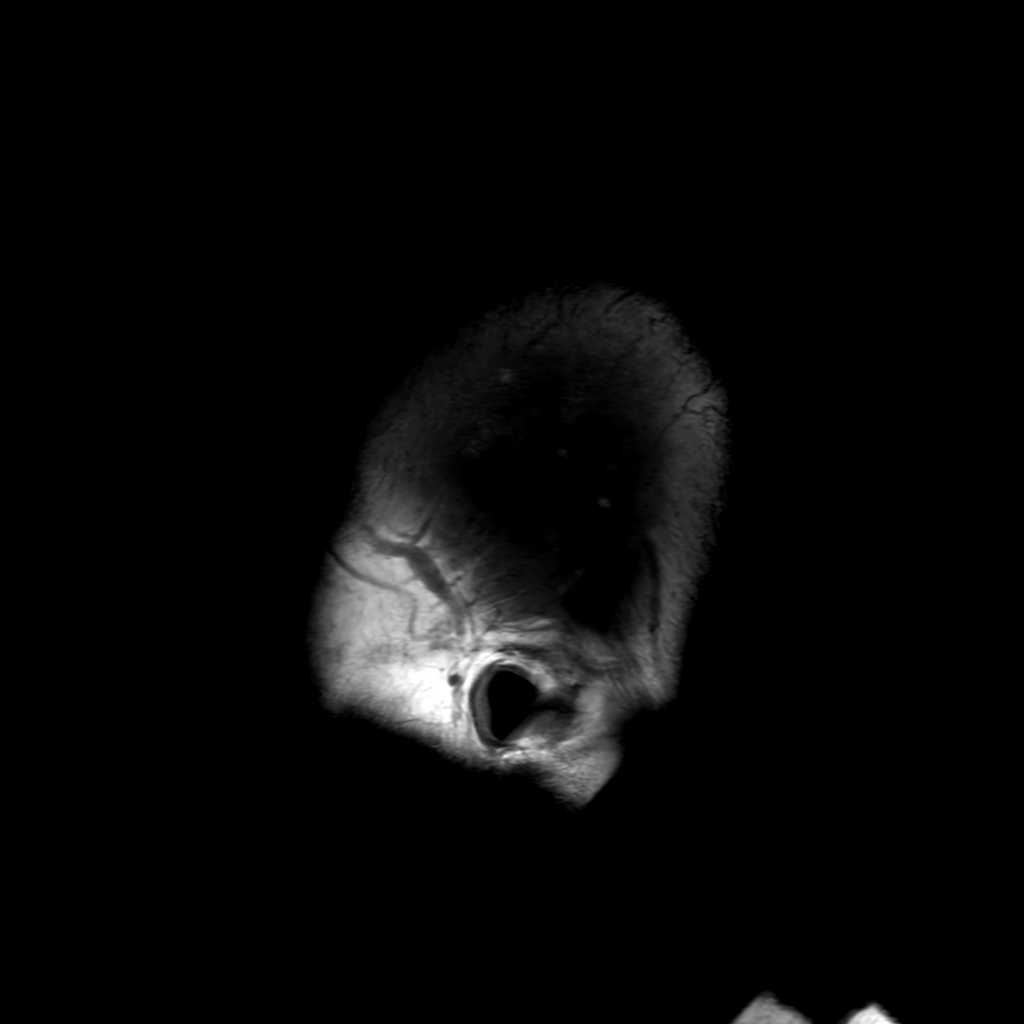

[Series 250: ADC · axial · 3.0mm · 0.94mm/px · z∈[-46,+98]mm · 4 of 49 slices shown (1 of 2)]
[im 1/49]
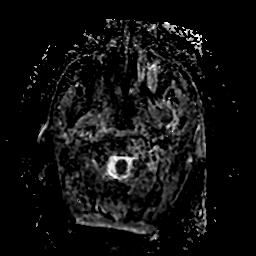
[im 17/49]
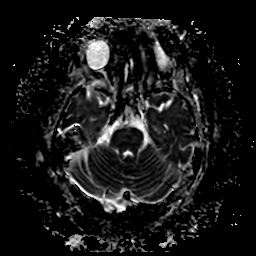
[im 33/49]
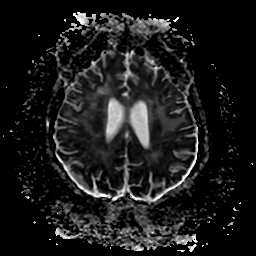
[im 49/49]
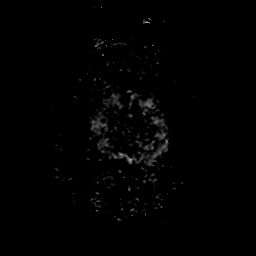

[Series 350: ADC · coronal · 4.0mm · 0.94mm/px · 2 of 30 slices shown (2 of 2)]
[im 1/30]
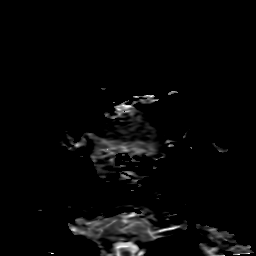
[im 30/30]
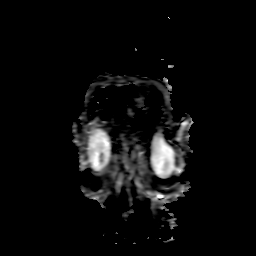

[27 of 48 positions shown; findings below may reference images not displayed]

FINDINGS: Brain: There is no evidence of an acute infarct, mass, midline
shift, or extra-axial fluid collection. Chronic microhemorrhages are
noted in the right pons and right lentiform nucleus. Widespread T2
hyperintensities are again seen in the cerebral white matter
bilaterally which are nonspecific but compatible with extensive
chronic small vessel ischemic disease. There are chronic lacunar
infarcts in the cerebral white matter bilaterally and in the right
basal ganglia. Cystic foci are again noted in the globe I palate I
suggestive of remote insults. There is mild cerebral atrophy.

Vascular: Major intracranial vascular flow voids are preserved.

Skull and upper cervical spine: Unremarkable bone marrow signal.

Sinuses/Orbits: Unremarkable orbits. Minimal mucosal thickening in
the paranasal sinuses. Clear mastoid air cells.

Other: None.
IMPRESSION: 1. No acute intracranial abnormality.
2. Extensive chronic small vessel ischemic disease.

## 2020-12-21 MED ORDER — CEPHALEXIN 500 MG PO CAPS
500.0000 mg | ORAL_CAPSULE | Freq: Two times a day (BID) | ORAL | Status: AC
  Administered 2020-12-21 – 2020-12-23 (×6): 500 mg via ORAL
  Filled 2020-12-21 (×6): qty 1

## 2020-12-21 MED ORDER — LORAZEPAM 1 MG PO TABS
1.0000 mg | ORAL_TABLET | ORAL | Status: DC | PRN
  Administered 2020-12-21: 1 mg via ORAL
  Filled 2020-12-21: qty 1

## 2020-12-21 MED ORDER — HYDROCODONE-ACETAMINOPHEN 5-325 MG PO TABS
1.0000 | ORAL_TABLET | Freq: Four times a day (QID) | ORAL | Status: DC | PRN
  Administered 2020-12-22 – 2021-01-02 (×13): 1 via ORAL
  Filled 2020-12-21 (×16): qty 1

## 2020-12-21 NOTE — Progress Notes (Signed)
OT Cancellation Note  Patient Details Name: Jacqueline Richmond MRN: [id number] DOB: 06-15-1945   Cancelled Treatment:    Reason Eval/Treat Not Completed: Patient at procedure or test/ unavailable (MRI. WIll return as schedule allows.)  [place], OTR/L Acute Rehab Pager: [phone] Office: [phone] 12/21/2020, 1:55 PM

## 2020-12-21 NOTE — Care Management Important Message (Signed)
Important Message  Patient Details  Name: Jacqueline Richmond MRN: [id number] Date of Birth: 1945-09-21   Medicare Important Message Given:  Yes     Orbie Pyo 12/21/2020, 3:37 PM

## 2020-12-21 NOTE — Evaluation (Signed)
Physical Therapy Evaluation Patient Details Name: Jacqueline Richmond MRN: [id number] DOB: [birth year] Today's Date: 12/21/2020  History of Present Illness  75 y.o. female presents to [facility] hospital on 12/18/2020 from podiatry with worsening R heel wound. PMH includes T2DM, HTN, HLD, CKD stage IV, gout, diverticulosis recently hospitalized 6/9-08/2012 for wound debridement and application of wound VAC on a previously operated T10 fracture site, discharged to SNF for 2 months prior to returning home.  Clinical Impression  Pt presents to PT with deficits in communication, cognition, balance, functional mobility, strength, power. PT evaluation limited as pt RLE begins to bleed from RLE immediately upon sitting at edge of bed. Pt requires significant physical assistance to obtain sitting position. Pt with significant expressive>receptive communication deficits at this time, limiting the pt's ability to express areas of pain and discomfort. Pt will benefit from continued acute PT services to improve mobility quality and reduce falls risk. PT recommends discharge to SNF as the pt has limited consistent physical assistance and is currently at a high risk for falls.       Recommendations for follow up therapy are one component of a multi-disciplinary discharge planning process, led by the attending physician.  Recommendations may be updated based on patient status, additional functional criteria and insurance authorization.  Follow Up Recommendations SNF    Equipment Recommendations   (defer to post-acute)    Recommendations for Other Services       Precautions / Restrictions Precautions Precautions: Fall Precaution Comments: bleeding from RLE upon sitting at side of bed Restrictions Weight Bearing Restrictions: No      Mobility  Bed Mobility Overal bed mobility: Needs Assistance Bed Mobility: Supine to Sit;Sit to Supine     Supine to sit: Mod assist;HOB elevated Sit to supine: Total assist    General bed mobility comments: totalA to return to supine to quicken process due to LE bleeding    Transfers Overall transfer level:  (deferred due to bleeding)                  Ambulation/Gait                Stairs            Wheelchair Mobility    Modified Rankin (Stroke Patients Only)       Balance Overall balance assessment: Needs assistance Sitting-balance support: Single extremity supported;Bilateral upper extremity supported;Feet supported Sitting balance-Leahy Scale: Poor Sitting balance - Comments: reliant on UE support of bed                                     Pertinent Vitals/Pain Pain Assessment: Faces Faces Pain Scale: Hurts whole lot Pain Location: unable to report Pain Descriptors / Indicators: Moaning;Grimacing Pain Intervention(s): Monitored during session    Home Living Family/patient expects to be discharged to:: Private residence Living Arrangements: Spouse/significant other Available Help at Discharge: Family;Personal care attendant (spouse is 90 and unable to provide significant physical assistance. PCA M/W/F for 4 hours) Type of Home: House Home Access: Level entry     Home Layout: Two level;Able to live on main level with bedroom/bathroom Home Equipment: Walker - 4 wheels;Bedside commode;Walker - 2 wheels;Wheelchair - manual;Hospital bed;Tub bench      Prior Function Level of Independence: Needs assistance   Gait / Transfers Assistance Needed: daughter in law reports pt had been ambulating with minG assist and RW prior to recent decline  of LE wounds           Hand Dominance   Dominant Hand: Right    Extremity/Trunk Assessment   Upper Extremity Assessment Upper Extremity Assessment: Generalized weakness    Lower Extremity Assessment Lower Extremity Assessment: Generalized weakness;RLE deficits/detail RLE Deficits / Details: bleeding from RLE upon sitting at edge of bed    Cervical / Trunk  Assessment Cervical / Trunk Assessment: Kyphotic  Communication   Communication: Receptive difficulties;Expressive difficulties  Cognition Arousal/Alertness: Awake/alert Behavior During Therapy: Flat affect Overall Cognitive Status: Difficult to assess (pt responding in a limited fashion verbally. Inconsistently responds to yes/no questions) Area of Impairment: Orientation;Attention;Memory;Following commands;Safety/judgement;Awareness;Problem solving                 Orientation Level: Disoriented to;Person;Place;Time;Situation (pt reports birthdate to be 13[birth year] instead of [birth year]th) Current Attention Level: Focused   Following Commands: Follows one step commands with increased time              General Comments General comments (skin integrity, edema, etc.): VSS on RA, pt with unna boots on BLE, blood dripping from RLE onto floor upon sitting at edge of bed. RN made aware, PT assists with dressing reinforcement    Exercises     Assessment/Plan    PT Assessment Patient needs continued PT services  PT Problem List Decreased strength;Decreased activity tolerance;Decreased balance;Decreased mobility;Decreased cognition;Decreased knowledge of use of DME;Decreased safety awareness;Decreased knowledge of precautions;Cardiopulmonary status limiting activity;Pain       PT Treatment Interventions DME instruction;Gait training;Functional mobility training;Therapeutic activities;Therapeutic exercise;Balance training;Neuromuscular re-education;Cognitive remediation;Patient/family education;Wheelchair mobility training    PT Goals (Current goals can be found in the Care Plan section)  Acute Rehab PT Goals Patient Stated Goal: to improve mobility and reduce falls risk PT Goal Formulation: With family Time For Goal Achievement: 01/04/21 Potential to Achieve Goals: Fair    Frequency Min 2X/week   Barriers to discharge        Co-evaluation               AM-PAC PT "6 Clicks"  Mobility  Outcome Measure Help needed turning from your back to your side while in a flat bed without using bedrails?: A Lot Help needed moving from lying on your back to sitting on the side of a flat bed without using bedrails?: A Lot Help needed moving to and from a bed to a chair (including a wheelchair)?: Total Help needed standing up from a chair using your arms (e.g., wheelchair or bedside chair)?: Total Help needed to walk in hospital room?: Total Help needed climbing 3-5 steps with a railing? : Total 6 Click Score: 8    End of Session   Activity Tolerance: Treatment limited secondary to medical complications (Comment) (bleeding) Patient left: in bed;with call bell/phone within reach;with bed alarm set;with family/visitor present Nurse Communication: Mobility status PT Visit Diagnosis: Other abnormalities of gait and mobility (R26.89);Muscle weakness (generalized) (M62.81);Pain Pain - Right/Left:  (pt unable to specify due to communication deficits)    Time: 1046-1106 PT Time Calculation (min) (ACUTE ONLY): 20 min   Charges:   PT Evaluation $PT Eval Moderate Complexity: 1 Mod          Zenaida Niece, PT, DPT Acute Rehabilitation Pager: [phone]   Zenaida Niece 12/21/2020, 12:21 PM

## 2020-12-21 NOTE — Progress Notes (Signed)
PROGRESS NOTE    Jacqueline Richmond  QJJ:[id number] DOB: [birth year] DOA: 12/18/2020 PCP: Bernerd Limbo, MD   Chief Complaint  Patient presents with   leg wound   Brief Narrative/Hospital Course: 75 old female with history of T2DM, HTN, HLD, CKD stage IV, gout, diverticulosis recently hospitalized 6/9-08/2012 for wound debridement and application of wound VAC on a previously operated T10 fracture site, has been in [place] [facility] to for 74 months and ultimately discharged home few weeks ago. Patient states that while at [facility], she developed wound and ulcer in her right heel.On 9/27, patient presented to podiatry Dr. Pasty Arch office for evaluation of worsening wound on the right heel.  Previously, she was tried on Keflex and doxycycline by her PCP without improvement.   In the podiatrist office, she was noted to have redness around the wound with drainage, full-thickness ulceration of the right heel.  There was a a concern of osteomyelitis of the right heel with possible venous insufficiency.  Patient was directed to ED at [facility].    In the ED, patient was afebrile, hemodynamically stable Labs with WBC count 10.1, lactic acid 1.1, hemoglobin 10.4, potassium 5.9, creatinine elevated to 3.5 compared to baseline of 2.1 from 10 days ago X-rays of right tibia-fibula and foot were obtained.  It did not show any evidence of osteomyelitis. For further work-up and management, patient was transferred to hospitalist service at [facility]. Patient underwent ABI today showed significant finding, discussed with Dr. Luan Pulling from vascular and transferred from [facility] to [facility] for further evaluation  Subjective: Seen this morning. Patient is alert awake not in distress but do not involve into a conversation although has clear speech, she is repeating the same question  She is not in distress. She is on room air. On re-eval this am with [name] at bedside she is more alert awake oriented to people place her DOB. Moving  all her extremities and no focal   Assessment & Plan:  Right decubitus heel ulcer with venous ulceration/cellulitis: In the setting of PAD based on ABI.  Noncompressible tibial arteries on ABI.  Vascular surgeon input appreciated not a good revascularization candidate.  Advised to continue Unna boot therapy- WC following,,offload the heel and follow-up with VVS clinic in 74 month for wound check.  If deteriorating wounds we will consider angiography at that time.  Fortunately no evidence of osteomyelitis on MRI seen by podiatry no further recommendation discontinue IV antibiotics changed to Keflex for total of  7 days.  Blood culture no growth. Recent Labs  Lab 12/18/20 1323 12/19/20 0532 12/20/20 0451 12/21/20 0326  WBC 10.1 11.0* 10.2 11.1*  LATICACIDVEN 1.1  --   --   --      Chronic appearing PAD based on ABI with absent flow on tibial arteries: On aspirin 81 mg.  Statin intolerance.  Possible MCI ?? But no such history per daughter in law suspect acute metabolic encephalopathy-in the setting of her AKI on CKD- at times unable to answer question- ?expressive aphasia- she is non focal on exam, will do Mri brain to rule out CVA.Check UA, B12 TSH and ammonia PT OT evaluation.  ?Hallucination- seeing snake. Of note she had recent fall during thoracentesis in IR in May-had eeg and mri brain and neuro eval that time. Thought may be it was  a panic attack   Falls/Generalized weakness Hx of fall and back injusry T10 fracture in May 10 and was hospitalized then to SNF for a month. PTOT eval.  AKI on CKD stage IV Metabolic acidosis due to CKD Baseline creat 2.19/16 peaked at 3.5 on admission now creatinine improved to baseline 2.2 BUN is still up but improving.  Keep on gentle IV fluid hydration, encourage oral intake. Followed by Dr. Posey Pronto as outpatient.  Bicarb at 20.  Stable  Recent Labs    08/31/20 0507 09/01/20 1514 09/29/20 0000 10/17/20 0000 12/03/20 1044 12/07/20 1322  12/18/20 1323 12/19/20 0532 12/20/20 0451 12/21/20 0326  BUN 47* 43* 56* 33* 48* 64* 106* 110* 81* 60*  CREATININE 3.13* 2.67* 1.9* 1.7* 1.64* 2.01* 3.50* 2.93* 2.78* 2.24*    Hyperkalemia: Hold Lokelma potassium better continue renal diet Recent Labs  Lab 12/18/20 1323 12/19/20 0532 12/20/20 0451 12/21/20 0326  K 5.9* 5.6* 5.0 4.2    T2DM stable A1c 5.45/22  Hold home Januvia, keep on sliding scale for now, blood sugar is stable Recent Labs  Lab 12/20/20 1636 12/20/20 1639 12/20/20 2117 12/21/20 0638 12/21/20 1106  GLUCAP 90 95 104* 108* 97    Essential hypertension: Well-controlled on home atenolol, clonidine   HLD-statin intolerance  History of gout-Allopurinol on hold  Microcytic anemia suspect chronic disease/CKD related H&H is stable.monitor Recent Labs    12/07/20 1322 12/18/20 1323 12/19/20 0532 12/20/20 0451 12/21/20 0326  HGB 11.3* 10.4* 9.6* 9.7* 10.5*  MCV 79.0* 78.7* 79.2* 78.7* 79.4*    Pressure Injury 12/19/20 Vertebral column Mid Deep Tissue Pressure Injury - Purple or maroon localized area of discolored intact skin or blood-filled blister due to damage of underlying soft tissue from pressure and/or shear. (Active)  12/19/20 2254  Location: Vertebral column  Location Orientation: Mid  Staging: Deep Tissue Pressure Injury - Purple or maroon localized area of discolored intact skin or blood-filled blister due to damage of underlying soft tissue from pressure and/or shear.  Wound Description (Comments):   Present on Admission: Yes   Diet Order             Diet renal with fluid restriction Fluid restriction: 1200 mL Fluid; Room service appropriate? Yes; Fluid consistency: Thin  Diet effective now                   DVT prophylaxis: heparin injection 5,000 Units Start: 12/18/20 2200 Code Status:   Code Status: Full Code  Family Communication: plan of care discussed with patient at bedside. Spoke w/ husband 9/29. Updated DIL [name] at  bedside  Status is: Inpatient Remains inpatient appropriate because:IV treatments appropriate due to intensity of illness or inability to take PO and Inpatient level of care appropriate due to severity of illness Dispo: The patient is from: Home              Anticipated d/c is to:  tbd pending PT OT evaluation and clearance of mental status              Patient currently is not medically stable to d/c.   Difficult to place patient No  Objective: Vitals: Today's Vitals   12/20/20 2116 12/21/20 0256 12/21/20 0529 12/21/20 0918  BP: (!) 158/95  (!) 152/72 (!) 143/74  Pulse: 92  89 86  Resp: 15  17 14   Temp: 98.1 F (36.7 C)  98.4 F (36.9 C) 98.4 F (36.9 C)  TempSrc:    Oral  SpO2: 99%  98% 98%  Weight: 66.6 kg     Height:      PainSc: 0-No pain Asleep     Physical Examination:  General exam: AA, clear  speech but does not answer questions at times, older than stated age, weak appearing. HEENT:Oral mucosa moist, Ear/Nose WNL grossly, dentition normal. Respiratory system: bilaterally clear breath sounds, no use of accessory muscle Cardiovascular system: S1 & S2 +, No JVD,. Gastrointestinal system: Abdomen soft, NT,ND, BS+ Nervous System:Alert, awake, moving all extremities well and grossly nonfocal Extremities: Lower extremity with Unna boot in place Skin: No rashes,no icterus. MSK: Normal muscle bulk,tone, power   Medications reviewed:  Scheduled Meds:  aspirin EC  81 mg Oral Daily   atenolol  100 mg Oral Daily   cephALEXin  500 mg Oral Q12H   cloNIDine  0.2 mg Oral BID   heparin  5,000 Units Subcutaneous Q8H   insulin aspart  0-5 Units Subcutaneous QHS   insulin aspart  0-9 Units Subcutaneous TID WC   liver oil-zinc oxide   Topical TID   senna  1 tablet Oral BID   Continuous Infusions:  sodium chloride 250 mL (12/18/20 1419)   sodium chloride 30 mL/hr at 12/21/20 0927    Intake/Output  Intake/Output Summary (Last 24 hours) at 12/21/2020 1126 Last data filed at  12/21/2020 0821 Gross per 24 hour  Intake 734.03 ml  Output 500 ml  Net 234.03 ml    Intake/Output from previous day: 09/29 0701 - 09/30 0700 In: 1321.3 [P.O.:90; I.V.:963.3; IV Piggyback:268] Out: 1050 [Urine:1050] Net IO Since Admission: 1,509.18 mL [12/21/20 1126]   Weight change:   Wt Readings from Last 3 Encounters:  12/20/20 66.6 kg  12/03/20 66.6 kg  10/18/20 68.2 kg     Consultants:see note  Procedures:see note Antimicrobials: Anti-infectives (From admission, onward)    Start     Dose/Rate Route Frequency Ordered Stop   12/21/20 1045  cephALEXin (KEFLEX) capsule 500 mg        500 mg Oral Every 12 hours 12/21/20 0954 12/24/20 0959   12/20/20 0730  vancomycin (VANCOCIN) IVPB 1000 mg/200 mL premix        1,000 mg 200 mL/hr over 60 Minutes Intravenous  Once 12/20/20 0637 12/20/20 1202   12/20/20 0730  piperacillin-tazobactam (ZOSYN) IVPB 2.25 g  Status:  Discontinued        2.25 g 100 mL/hr over 30 Minutes Intravenous Every 8 hours 12/20/20 0638 12/21/20 0854   12/19/20 1400  piperacillin-tazobactam (ZOSYN) IVPB 3.375 g  Status:  Discontinued        3.375 g 12.5 mL/hr over 240 Minutes Intravenous Every 8 hours 12/19/20 0715 12/19/20 0716   12/19/20 1400  piperacillin-tazobactam (ZOSYN) IVPB 3.375 g  Status:  Discontinued        3.375 g 100 mL/hr over 30 Minutes Intravenous Every 8 hours 12/19/20 0718 12/20/20 0638   12/18/20 2200  piperacillin-tazobactam (ZOSYN) IVPB 2.25 g  Status:  Discontinued        2.25 g 100 mL/hr over 30 Minutes Intravenous Every 8 hours 12/18/20 1842 12/19/20 0715   12/18/20 1555  vancomycin variable dose per unstable renal function (pharmacist dosing)  Status:  Discontinued         Does not apply See admin instructions 12/18/20 1555 12/21/20 0854   12/18/20 1500  vancomycin (VANCOCIN) IVPB 1000 mg/200 mL premix        1,000 mg 200 mL/hr over 60 Minutes Intravenous  Once 12/18/20 1458 12/18/20 1622   12/18/20 1400  piperacillin-tazobactam  (ZOSYN) IVPB 3.375 g        3.375 g 100 mL/hr over 30 Minutes Intravenous  Once 12/18/20 1349 12/18/20 1510  Culture/Microbiology    Component Value Date/Time   SDES  12/18/2020 1344    RIGHT ANTECUBITAL Performed at [facility], [address] [place], [place] [address]    [facility]  12/18/2020 1344    Blood Culture adequate volume Performed at [facility], [address], [place], [place] [address]    CULT  12/18/2020 1344    NO GROWTH 3 DAYS Performed at [facility] [address] [place], [place] [address]    REPTSTATUS PENDING 12/18/2020 1344    Other culture-see note  Unresulted Labs (From admission, onward)     Start     Ordered   12/21/20 1126  Ammonia  Add-on,   AD        12/21/20 1125   12/21/20 1126  TSH  Add-on,   AD        12/21/20 1125   12/21/20 1126  Vitamin B12  Add-on,   AD        12/21/20 1125            Data Reviewed: I have personally reviewed following labs and imaging studies CBC: Recent Labs  Lab 12/18/20 1323 12/19/20 0532 12/20/20 0451 12/21/20 0326  WBC 10.1 11.0* 10.2 11.1*  HGB 10.4* 9.6* 9.7* 10.5*  HCT 33.9* 31.2* 30.6* 34.0*  MCV 78.7* 79.2* 78.7* 79.4*  PLT 313 296 273 332    Basic Metabolic Panel: Recent Labs  Lab 12/18/20 1323 12/19/20 0532 12/20/20 0451 12/21/20 0326  NA 137 142 139 141  K 5.9* 5.6* 5.0 4.2  CL 106 112* 110 112*  CO2 20* 21* 20* 20*  GLUCOSE 157* 101* 88 114*  BUN 106* 110* 81* 60*  CREATININE 3.50* 2.93* 2.78* 2.24*  CALCIUM 10.1 9.4 8.9 9.0    GFR: Estimated Creatinine Clearance: 20.7 mL/min (A) (by C-G formula based on SCr of 2.24 mg/dL (H)). Liver Function Tests: Recent Labs  Lab 12/18/20 1323  AST 10*  ALT 7  ALKPHOS 99  BILITOT 0.3  PROT 7.7  ALBUMIN 3.5    No results for input(s): LIPASE, AMYLASE in the last 168 hours. No results for input(s): AMMONIA in the last 168 hours. Coagulation Profile: No results for  input(s): INR, PROTIME in the last 168 hours. Cardiac Enzymes: No results for input(s): CKTOTAL, CKMB, CKMBINDEX, TROPONINI in the last 168 hours. BNP (last 3 results) No results for input(s): PROBNP in the last 8760 hours. HbA1C: Recent Labs    12/19/20 0532  HGBA1C 6.3*    CBG: Recent Labs  Lab 12/20/20 1636 12/20/20 1639 12/20/20 2117 12/21/20 0638 12/21/20 1106  GLUCAP 90 95 104* 108* 97    Lipid Profile: No results for input(s): CHOL, HDL, LDLCALC, TRIG, CHOLHDL, LDLDIRECT in the last 72 hours. Thyroid Function Tests: No results for input(s): TSH, T4TOTAL, FREET4, T3FREE, THYROIDAB in the last 72 hours. Anemia Panel: No results for input(s): VITAMINB12, FOLATE, FERRITIN, TIBC, IRON, RETICCTPCT in the last 72 hours. Sepsis Labs: Recent Labs  Lab 12/18/20 1323  LATICACIDVEN 1.1     Recent Results (from the past 240 hour(s))  Resp Panel by RT-PCR (Flu A&B, Covid) Nasopharyngeal Swab     Status: None   Collection Time: 12/18/20  1:23 PM   Specimen: Nasopharyngeal Swab; Nasopharyngeal(NP) swabs in vial transport medium  Result Value Ref Range Status   SARS Coronavirus 2 by RT PCR NEGATIVE NEGATIVE Final    Comment: (NOTE) SARS-CoV-2 target nucleic acids are NOT DETECTED.  The SARS-CoV-2 RNA is generally  detectable in upper respiratory specimens during the acute phase of infection. The lowest concentration of SARS-CoV-2 viral copies this assay can detect is 138 copies/mL. A negative result does not preclude SARS-Cov-2 infection and should not be used as the sole basis for treatment or other patient management decisions. A negative result may occur with  improper specimen collection/handling, submission of specimen other than nasopharyngeal swab, presence of viral mutation(s) within the areas targeted by this assay, and inadequate number of viral copies(<138 copies/mL). A negative result must be combined with clinical observations, patient history, and  epidemiological information. The expected result is Negative.  Fact Sheet for Patients:  EntrepreneurPulse.com.au  Fact Sheet for Healthcare Providers:  IncredibleEmployment.be  This test is no t yet approved or cleared by the [place] FDA and  has been authorized for detection and/or diagnosis of SARS-CoV-2 by FDA under an Emergency Use Authorization (EUA). This EUA will remain  in effect (meaning this test can be used) for the duration of the COVID-19 declaration under Section 564(b)(1) of the Act, 21 U.S.C.section 360bbb-3(b)(1), unless the authorization is terminated  or revoked sooner.       Influenza A by PCR NEGATIVE NEGATIVE Final   Influenza B by PCR NEGATIVE NEGATIVE Final    Comment: (NOTE) The Xpert Xpress SARS-CoV-2/FLU/RSV plus assay is intended as an aid in the diagnosis of influenza from Nasopharyngeal swab specimens and should not be used as a sole basis for treatment. Nasal washings and aspirates are unacceptable for Xpert Xpress SARS-CoV-2/FLU/RSV testing.  Fact Sheet for Patients: EntrepreneurPulse.com.au  Fact Sheet for Healthcare Providers: IncredibleEmployment.be  This test is not yet approved or cleared by the [place] FDA and has been authorized for detection and/or diagnosis of SARS-CoV-2 by FDA under an Emergency Use Authorization (EUA). This EUA will remain in effect (meaning this test can be used) for the duration of the COVID-19 declaration under Section 564(b)(1) of the Act, 21 U.S.C. section 360bbb-3(b)(1), unless the authorization is terminated or revoked.  Performed at [facility], [address] [place], [place] [address]   Blood culture (routine x 2)     Status: None (Preliminary result)   Collection Time: 12/18/20  1:25 PM   Specimen: Right Antecubital; Blood  Result Value Ref Range Status   Specimen Description   Final    RIGHT  ANTECUBITAL Performed at [facility], [address] [place], [place] [address]    Special Requests   Final    Blood Culture adequate volume Performed at [facility], [address], [place], [place] [address]    Culture   Final    NO GROWTH 3 DAYS Performed at [facility], [place] [address] [place], [place] [address]    Report Status PENDING  Incomplete  Blood culture (routine x 2)     Status: None (Preliminary result)   Collection Time: 12/18/20  1:44 PM   Specimen: Right Antecubital; Blood  Result Value Ref Range Status   Specimen Description   Final    RIGHT ANTECUBITAL Performed at [facility], [address] [place], [place] [address]    Special Requests   Final    Blood Culture adequate volume Performed at [facility], [address] [place], [place] [address]    Culture   Final    NO GROWTH 3 DAYS Performed at [facility], [place] [address], [place], [place] [address]    Report Status PENDING  Incomplete  Radiology Studies: MR HEEL RIGHT WO CONTRAST  Result Date: 12/19/2020 CLINICAL DATA:  Foot swelling and pain. EXAM: MR OF THE RIGHT HEEL WITHOUT CONTRAST TECHNIQUE: Multiplanar, multisequence MR imaging of the right ankle was performed. No intravenous contrast was administered. COMPARISON:  None. FINDINGS: Patient motion degrading image quality limiting evaluation. TENDONS Peroneal: Peroneal longus tendon intact. Peroneal brevis intact. Posteromedial: Posterior tibial tendon intact. Flexor hallucis longus tendon intact. Flexor digitorum longus tendon intact. Anterior: Tibialis anterior tendon intact. Extensor hallucis longus tendon intact Extensor digitorum longus tendon intact. Achilles:  Mild tendinosis of the distal Achilles tendon. Plantar Fascia: Intact. LIGAMENTS Lateral: Anterior talofibular ligament intact. Calcaneofibular ligament intact. Posterior talofibular  ligament intact. Anterior and posterior tibiofibular ligaments intact. Medial: Deltoid ligament intact. Spring ligament intact. CARTILAGE Ankle Joint: No joint effusion. Partial-thickness cartilage loss of the tibiotalar joint. Subtalar Joints/Sinus Tarsi: Normal sinus tarsi. Partial-thickness cartilage loss of the subtalar joints. Bones: No marrow signal abnormality. No fracture or dislocation. Mild osteoarthritis of the talonavicular joint. Osteoarthritis of the navicular-medial cuneiform joint. Moderate osteoarthritis of the second, third and fourth tarsometatarsal joints. Soft Tissue: No fluid collection or hematoma. Muscles are normal without edema or atrophy. Tarsal tunnel is normal. IMPRESSION: 1. Mild tendinosis of the distal Achilles tendon. 2. No evidence of osteomyelitis of the calcaneus. 3. Partial-thickness cartilage loss of the tibiotalar joint. 4. Mild osteoarthritis of the talonavicular joint. 5. Osteoarthritis of the navicular-medial cuneiform joint. Electronically Signed   By: Kathreen Devoid M.D.   On: 12/19/2020 21:18   VAS [place] ABI WITH/WO TBI  Result Date: 12/19/2020  LOWER EXTREMITY DOPPLER STUDY Patient Name:  REHEMA MUFFLEY  Date of Exam:   12/19/2020 Medical Rec #: [id number]      Accession #:    [id number] Date of Birth: [birth year]       Patient Gender: F Patient Age:   52 years Exam Location:  [facility] Procedure:      VAS [place] ABI WITH/WO TBI Referring Phys: Terrilee Croak --------------------------------------------------------------------------------  Indications: Ulceration, and cellulitis. High Risk Factors: Hypertension, hyperlipidemia, Diabetes, no history of                    smoking. Other Factors: CKD3.  Comparison Study: No previous exams Performing Technologist: Hill, Jody RVT, RDMS  Examination Guidelines: A complete evaluation includes at minimum, Doppler waveform signals and systolic blood pressure reading at the level of bilateral brachial, anterior tibial, and posterior  tibial arteries, when vessel segments are accessible. Bilateral testing is considered an integral part of a complete examination. Photoelectric Plethysmograph (PPG) waveforms and toe systolic pressure readings are included as required and additional duplex testing as needed. Limited examinations for reoccurring indications may be performed as noted.  ABI Findings: +---------+------------------+-----+----------+--------+ Right    Rt Pressure (mmHg)IndexWaveform  Comment  +---------+------------------+-----+----------+--------+ Brachial 105                    triphasic          +---------+------------------+-----+----------+--------+ PTA                             monophasic>254     +---------+------------------+-----+----------+--------+ DP                              monophasic>254     +---------+------------------+-----+----------+--------+ Great Toe  Absent             +---------+------------------+-----+----------+--------+ +---------+------------------+-----+----------+-------+ Left     Lt Pressure (mmHg)IndexWaveform  Comment +---------+------------------+-----+----------+-------+ Brachial 110                    triphasic         +---------+------------------+-----+----------+-------+ PTA                             monophasic>254    +---------+------------------+-----+----------+-------+ DP                              monophasic>254    +---------+------------------+-----+----------+-------+ Great Toe39                0.35 Abnormal  Severe  +---------+------------------+-----+----------+-------+ +-------+-----------+-----------+------------+------------+ ABI/TBIToday's ABIToday's TBIPrevious ABIPrevious TBI +-------+-----------+-----------+------------+------------+ Right  [place]         0                                   +-------+-----------+-----------+------------+------------+ Left   [place]         0.35                                 +-------+-----------+-----------+------------+------------+ Arterial wall calcification precludes accurate ankle pressures and ABIs.  Summary: Right: Resting right ankle-brachial index indicates noncompressible right lower extremity arteries and monophasic waveforms. The right toe-brachial index is abnormal (absence of flow). Left: Resting left ankle-brachial index indicates noncompressible left lower extremity arteries and monophasic waveforms. The left toe-brachial index is abnormal (severe small vessel disease).  *See table(s) above for measurements and observations.  Vascular consult recommended. Electronically signed by Jamelle Haring on 12/19/2020 at 5:25:23 PM.    Final      LOS: 3 days   Antonieta Pert, MD [facility]  12/21/2020, 11:26 AM

## 2020-12-22 DIAGNOSIS — I872 Venous insufficiency (chronic) (peripheral): Secondary | ICD-10-CM | POA: Diagnosis not present

## 2020-12-22 DIAGNOSIS — Z87820 Personal history of traumatic brain injury: Secondary | ICD-10-CM | POA: Diagnosis not present

## 2020-12-22 DIAGNOSIS — N184 Chronic kidney disease, stage 4 (severe): Secondary | ICD-10-CM | POA: Diagnosis not present

## 2020-12-22 DIAGNOSIS — E1122 Type 2 diabetes mellitus with diabetic chronic kidney disease: Secondary | ICD-10-CM | POA: Diagnosis not present

## 2020-12-22 DIAGNOSIS — E875 Hyperkalemia: Secondary | ICD-10-CM | POA: Diagnosis present

## 2020-12-22 DIAGNOSIS — L03116 Cellulitis of left lower limb: Secondary | ICD-10-CM | POA: Diagnosis not present

## 2020-12-22 DIAGNOSIS — D631 Anemia in chronic kidney disease: Secondary | ICD-10-CM | POA: Diagnosis not present

## 2020-12-22 DIAGNOSIS — S81801D Unspecified open wound, right lower leg, subsequent encounter: Secondary | ICD-10-CM

## 2020-12-22 DIAGNOSIS — M109 Gout, unspecified: Secondary | ICD-10-CM | POA: Diagnosis not present

## 2020-12-22 DIAGNOSIS — D539 Nutritional anemia, unspecified: Secondary | ICD-10-CM | POA: Diagnosis not present

## 2020-12-22 DIAGNOSIS — M1990 Unspecified osteoarthritis, unspecified site: Secondary | ICD-10-CM | POA: Diagnosis not present

## 2020-12-22 DIAGNOSIS — I951 Orthostatic hypotension: Secondary | ICD-10-CM | POA: Diagnosis not present

## 2020-12-22 DIAGNOSIS — L89106 Pressure-induced deep tissue damage of unspecified part of back: Secondary | ICD-10-CM | POA: Diagnosis not present

## 2020-12-22 DIAGNOSIS — L97929 Non-pressure chronic ulcer of unspecified part of left lower leg with unspecified severity: Secondary | ICD-10-CM | POA: Diagnosis not present

## 2020-12-22 DIAGNOSIS — E872 Acidosis, unspecified: Secondary | ICD-10-CM | POA: Diagnosis not present

## 2020-12-22 DIAGNOSIS — Z20822 Contact with and (suspected) exposure to covid-19: Secondary | ICD-10-CM | POA: Diagnosis not present

## 2020-12-22 DIAGNOSIS — N179 Acute kidney failure, unspecified: Secondary | ICD-10-CM | POA: Diagnosis not present

## 2020-12-22 DIAGNOSIS — I700 Atherosclerosis of aorta: Secondary | ICD-10-CM | POA: Diagnosis not present

## 2020-12-22 DIAGNOSIS — I129 Hypertensive chronic kidney disease with stage 1 through stage 4 chronic kidney disease, or unspecified chronic kidney disease: Secondary | ICD-10-CM | POA: Diagnosis not present

## 2020-12-22 DIAGNOSIS — Z23 Encounter for immunization: Secondary | ICD-10-CM | POA: Diagnosis not present

## 2020-12-22 DIAGNOSIS — L89616 Pressure-induced deep tissue damage of right heel: Secondary | ICD-10-CM | POA: Diagnosis not present

## 2020-12-22 DIAGNOSIS — E118 Type 2 diabetes mellitus with unspecified complications: Secondary | ICD-10-CM | POA: Diagnosis not present

## 2020-12-22 DIAGNOSIS — G9341 Metabolic encephalopathy: Secondary | ICD-10-CM | POA: Diagnosis not present

## 2020-12-22 DIAGNOSIS — E785 Hyperlipidemia, unspecified: Secondary | ICD-10-CM | POA: Diagnosis not present

## 2020-12-22 DIAGNOSIS — L03115 Cellulitis of right lower limb: Secondary | ICD-10-CM | POA: Diagnosis not present

## 2020-12-22 DIAGNOSIS — E1151 Type 2 diabetes mellitus with diabetic peripheral angiopathy without gangrene: Secondary | ICD-10-CM | POA: Diagnosis not present

## 2020-12-22 LAB — CBC
HCT: 31.9 % — ABNORMAL LOW (ref 36.0–46.0)
Hemoglobin: 9.7 g/dL — ABNORMAL LOW (ref 12.0–15.0)
MCH: 24.4 pg — ABNORMAL LOW (ref 26.0–34.0)
MCHC: 30.4 g/dL (ref 30.0–36.0)
MCV: 80.4 fL (ref 80.0–100.0)
Platelets: 255 10*3/uL (ref 150–400)
RBC: 3.97 MIL/uL (ref 3.87–5.11)
RDW: 17.4 % — ABNORMAL HIGH (ref 11.5–15.5)
WBC: 8.7 10*3/uL (ref 4.0–10.5)
nRBC: 0 % (ref 0.0–0.2)

## 2020-12-22 LAB — BASIC METABOLIC PANEL
Anion gap: 7 (ref 5–15)
BUN: 46 mg/dL — ABNORMAL HIGH (ref 8–23)
CO2: 20 mmol/L — ABNORMAL LOW (ref 22–32)
Calcium: 8.9 mg/dL (ref 8.9–10.3)
Chloride: 116 mmol/L — ABNORMAL HIGH (ref 98–111)
Creatinine, Ser: 1.91 mg/dL — ABNORMAL HIGH (ref 0.44–1.00)
GFR, Estimated: 27 mL/min — ABNORMAL LOW (ref >60)
Glucose, Bld: 88 mg/dL (ref 70–99)
Potassium: 4 mmol/L (ref 3.5–5.1)
Sodium: 143 mmol/L (ref 135–145)

## 2020-12-22 LAB — GLUCOSE, CAPILLARY
Glucose-Capillary: 137 mg/dL — ABNORMAL HIGH (ref 70–99)
Glucose-Capillary: 122 mg/dL — ABNORMAL HIGH (ref 70–99)
Glucose-Capillary: 120 mg/dL — ABNORMAL HIGH (ref 70–99)
Glucose-Capillary: 203 mg/dL — ABNORMAL HIGH (ref 70–99)

## 2020-12-22 LAB — URINE CULTURE: Culture: NO GROWTH

## 2020-12-22 NOTE — TOC Initial Note (Addendum)
Transition of Care [facility]) - Initial/Assessment Note    Patient Details  Name: Jacqueline Richmond MRN: [id number] Date of Birth: 03-07-1946  Transition of Care [facility]) CM/SW Contact:    Emeterio Reeve, LCSW Phone Number: 12/22/2020, 11:47 AM  Clinical Narrative:                  CSW received SNF consult. CSW met with pt at bedside. CSW introduced self and explained role at the hospital. Pt reports that PTA Pt was living at home with husband. Pt was independent with mobility and ADL's.   CSW reviewed PT/OT recommendations for SNF. Pt passed phone to husband. Husband requesting csw speak to daughter in law [place] about SNF. Husband did give CSW permission to fax to SNFs in the area.  Pt gave CSW permission to fax out to facilities in the area. Pt has no preference of facility at this time. CSW gave pt medicare.gov rating list to review. CSW explained insurance auth process. Pt reports they are covid vaccinated with two boosters.   12:20pm- CSW spoke to Pts daughter in Sports coach [place]. [name] states that they are willing for her to go to SNF for rehab. [facility] and they prefer for t not to return. [name] will continue to assist pt with finding a SNF bed.  CSW will continue to follow.   Expected Discharge Plan: Skilled Nursing Facility Barriers to Discharge: Continued Medical Work up, Ship broker   Patient Goals and CMS Choice Patient states their goals for this hospitalization and ongoing recovery are:: to get stronger CMS Medicare.gov Compare Post Acute Care list provided to:: Patient Choice offered to / list presented to : Patient, Spouse, Adult Children  Expected Discharge Plan and Services Expected Discharge Plan: [place] arrangements for the past 2 months: Single Family Home                                      Prior Living Arrangements/Services Living arrangements for the past 2 months: Single Family  Home Lives with:: Spouse Patient language and need for interpreter reviewed:: Yes Do you feel safe going back to the place where you live?: Yes      Need for Family Participation in Patient Care: Yes (Comment) Care giver support system in place?: Yes (comment)   Criminal Activity/Legal Involvement Pertinent to Current Situation/Hospitalization: No - Comment as needed  Activities of Daily Living Home Assistive Devices/Equipment: Walker (specify type) ADL Screening (condition at time of admission) Patient's cognitive ability adequate to safely complete daily activities?: Yes Is the patient deaf or have difficulty hearing?: Yes Does the patient have difficulty seeing, even when wearing glasses/contacts?: No Does the patient have difficulty concentrating, remembering, or making decisions?: No Patient able to express need for assistance with ADLs?: Yes Does the patient have difficulty dressing or bathing?: No Independently performs ADLs?: No Does the patient have difficulty walking or climbing stairs?: Yes Weakness of Legs: Both Weakness of Arms/Hands: None  Permission Sought/Granted Permission sought to share information with : Family Supports, Chartered certified accountant granted to share information with : Yes, Verbal Permission Granted     Permission granted to share info w AGENCY: SNF        Emotional Assessment Appearance:: Appears stated age Attitude/Demeanor/Rapport: Engaged Affect (typically observed): Appropriate Orientation: : Oriented to Self, Oriented to  Place, Oriented to  Time, Oriented to Situation Alcohol / Substance Use: Not Applicable Psych Involvement: No (comment)  Admission diagnosis:  Hyperkalemia [E87.5] Venous insufficiency [I87.2] Leg wound, right [S81.801A] AKI (acute kidney injury) (Interior) [N17.9] Cellulitis of right lower leg [L03.115] 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] Patient Active Problem List   Diagnosis Date Noted   Venous  insufficiency    Cellulitis of right lower leg    Leg wound, right 12/18/2020   Diabetic foot ([place]) 12/18/2020   Wound dehiscence 08/30/2020   Multiple thyroid nodules 08/10/2020   Acute blood loss anemia 08/09/2020   Hemothorax on right 08/02/2020   T10 vertebral fracture ([place]) 07/31/2020   Rotator cuff arthropathy of both shoulders 04/15/2019   History of colonic polyps    Polyp of transverse colon    Diverticulosis of colon without hemorrhage    Difficult intubation    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Phone: [phone]: [phone] [place]Phone: [phone]: [phone]    Social Determinants of Health (SDOH) Interventions    Readmission Risk Interventions No flowsheet data found.  MEmeterio Reeve LCSW Clinical Social Worker

## 2020-12-22 NOTE — NC FL2 (Signed)
[place] LEVEL OF CARE SCREENING TOOL     IDENTIFICATION  Patient Name: Jacqueline Richmond Birthdate: [birth year] Sex: female Admission Date (Current Location): 12/18/2020  [facility] [place] Number:  Herbalist and Address:  [facility], [place] [address] [place], [place] [address]      Provider Number: [id number]  Attending Physician Name and Address:  Cristal Deer, MD  Relative Name and Phone Number:       Current Level of Care: Hospital Recommended Level of Care: [place] Prior Approval Number:    Date Approved/Denied:   PASRR Number: [id number]  Discharge Plan: SNF    Current Diagnoses: Patient Active Problem List   Diagnosis Date Noted   Venous insufficiency    Cellulitis of right lower leg    Leg wound, right 12/18/2020   Diabetic foot ([place]) 12/18/2020   Wound dehiscence 08/30/2020   Multiple thyroid nodules 08/10/2020   Acute blood loss anemia 08/09/2020   Hemothorax on right 08/02/2020   T10 vertebral fracture ([place]) 07/31/2020   Rotator cuff arthropathy of both shoulders 04/15/2019   History of colonic polyps    Polyp of transverse colon    Diverticulosis of colon without hemorrhage    Difficult intubation    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   Uncontrolled secondary diabetes mellitus with stage 3 CKD (GFR 30-59) ([place]) 08/29/2011   CKD (chronic kidney disease) stage 3, GFR 30-59 ml/min (HCC) 08/29/2011   Acute renal failure superimposed on stage 3a chronic kidney disease ([place]) 08/29/2011   Hyperkalemia 08/29/2011   HTN (hypertension) 08/29/2011   Gout 08/29/2011    Orientation RESPIRATION BLADDER Height & Weight     Self  Normal Incontinent Weight: 146 lb 12.9 oz (66.6 kg) Height:  5\' 4"  (162.6 cm)  BEHAVIORAL SYMPTOMS/MOOD NEUROLOGICAL BOWEL NUTRITION STATUS      Incontinent Diet  AMBULATORY STATUS COMMUNICATION OF NEEDS Skin   Total Care Verbally PU Stage and Appropriate Care                        Personal Care Assistance Level of Assistance  Bathing, Feeding, Dressing, Total care Bathing Assistance: Maximum assistance Feeding assistance: Maximum assistance Dressing Assistance: Maximum assistance Total Care Assistance: Maximum assistance   Functional Limitations Info  Sight, Hearing, Speech Sight Info: Adequate Hearing Info: Impaired Speech Info: Adequate    SPECIAL CARE FACTORS FREQUENCY  PT (By licensed PT), OT (By licensed OT)     PT Frequency: 5x a week OT Frequency: 5x a week            Contractures Contractures Info: Not present    Additional Factors Info  Code Status, Allergies Code Status Info: Full Allergies Info: Codeine   Iodinated Diagnostic Agents   Pentazocine   Statins           Current Medications (12/22/2020):  This is the current hospital active medication list Current Facility-Administered Medications  Medication Dose Route Frequency Provider Last Rate Last Admin   0.9 %  sodium chloride infusion   Intravenous PRN Lajean Saver, MD   Stopped at 12/21/20 2036   0.9 %  sodium chloride infusion   Intravenous Continuous Kc, Ramesh, MD 30 mL/hr at 12/22/20 0400 Infusion Verify at 12/22/20 0400   acetaminophen (TYLENOL) tablet 650 mg  650 mg Oral Q6H PRN Terrilee Croak, MD   650 mg at 12/18/20 1934   Or   acetaminophen (TYLENOL) suppository 650 mg  650 mg Rectal Q6H PRN Terrilee Croak, MD       aspirin EC tablet 81 mg  81 mg Oral Daily Dahal, Binaya, MD   81 mg at 12/22/20 1000   atenolol (TENORMIN) tablet 100 mg  100 mg Oral Daily Dahal, Marlowe Aschoff, MD   100 mg at 12/22/20 1004   bisacodyl (DULCOLAX) suppository 10 mg  10 mg Rectal Daily PRN Dahal, Marlowe Aschoff, MD       cephALEXin (KEFLEX) capsule 500 mg  500 mg Oral Q12H Kc, Ramesh, MD   500 mg at 12/22/20 1000   cloNIDine (CATAPRES) tablet 0.2 mg  0.2 mg Oral BID Dahal, Marlowe Aschoff, MD   0.2 mg at 12/22/20 1000   diphenhydrAMINE-zinc acetate (BENADRYL) 2-0.1 % cream   Topical PRN Kathryne Eriksson, NP       heparin injection 5,000 Units  5,000 Units Subcutaneous Q8H Dahal, Marlowe Aschoff, MD   5,000 Units at 12/22/20 3143   hydrALAZINE (APRESOLINE) injection 10 mg  10 mg Intravenous Q6H PRN Dahal, Marlowe Aschoff, MD       HYDROcodone-acetaminophen (NORCO/VICODIN) 5-325 MG per tablet 1 tablet  1 tablet Oral Q6H PRN Kc, Ramesh, MD       insulin aspart (novoLOG) injection 0-5 Units  0-5 Units Subcutaneous QHS Dahal, Binaya, MD       insulin aspart (novoLOG) injection 0-9 Units  0-9 Units Subcutaneous TID WC Dahal, Binaya, MD   1 Units at 12/22/20 1000   liver oil-zinc oxide (DESITIN) 40 % ointment   Topical TID Terrilee Croak, MD   Given at 12/22/20 1001   LORazepam (ATIVAN) tablet 1 mg  1 mg Oral PRN Antonieta Pert, MD   1 mg at 12/21/20 1326   senna (SENOKOT) tablet 8.6 mg  1 tablet Oral BID Terrilee Croak, MD   8.6 mg at 12/21/20 2117     Discharge Medications: Please see discharge summary for a list of discharge medications.  Relevant Imaging Results:  Relevant Lab Results:   Additional Information SSN [id number]  [place] COVID-19 Vaccine 07/17/2020 , 11/22/2019 , 05/26/2019 , 05/01/2019  Emeterio Reeve, LCSW

## 2020-12-22 NOTE — Progress Notes (Signed)
Chart reviewed recommendation for discharge to SNF.  Discharge wound care order recommendations placed, and she can follow-up at the [facility]. Please let us know if you have further questions, we will sign off at this point if there are no further podiatric issues.  Lanae Crumbly, DPM 12/22/2020

## 2020-12-22 NOTE — Evaluation (Signed)
Occupational Therapy Evaluation Patient Details Name: Jacqueline Richmond MRN: [id number] DOB: [birth year] Today's Date: 12/22/2020   History of Present Illness 75 y.o. female presents to [facility] hospital on 12/18/2020 from podiatry with worsening R heel wound. PMH includes T2DM, HTN, HLD, CKD stage IV, gout, diverticulosis recently hospitalized 6/9-08/2012 for wound debridement and application of wound VAC on a previously operated T10 fracture site, discharged to SNF for 2 months prior to returning home.   Clinical Impression   Pt admitted for concerns listed above. PTA pt reported that she was independent with most ADL's and had aides/family that assist with bathing and IADL's as needed. Pt recently discharged from a SNF and was ambulating with a RW. At this time session was limited due to pt bleeding out on to the floor from BLE. RN was notified and pt was assisted back to bed for more pressure dressing to be applied. Most likely due to limitations with bed mobility and overall strength, pt will need min-max A with all ADL's and functional mobility. Recommend SNF to maximize her return to baseline, if pt refuses, she will need [facility] services. OT will follow acutely.       Recommendations for follow up therapy are one component of a multi-disciplinary discharge planning process, led by the attending physician.  Recommendations may be updated based on patient status, additional functional criteria and insurance authorization.   Follow Up Recommendations  SNF (Pt most likely will decline, however husband is unable to provide any physical assist to pt.)    Equipment Recommendations  None recommended by OT    Recommendations for Other Services       Precautions / Restrictions Precautions Precautions: Fall Precaution Comments: bleeding from RLE upon sitting at side of bed Restrictions Weight Bearing Restrictions: No      Mobility Bed Mobility Overal bed mobility: Needs Assistance Bed Mobility:  Supine to Sit;Sit to Supine     Supine to sit: Mod assist;HOB elevated Sit to supine: Max assist   General bed mobility comments: Max A to return to supine due to weakness in BLE    Transfers                 General transfer comment: Unable to stand this session due to BLE bleeding    Balance Overall balance assessment: Needs assistance Sitting-balance support: Bilateral upper extremity supported;Feet supported Sitting balance-Leahy Scale: Poor Sitting balance - Comments: reliant on UE support of bed       Standing balance comment: Deferred due to bleeding                           ADL either performed or assessed with clinical judgement   ADL Overall ADL's : Needs assistance/impaired                                       General ADL Comments: Assessment limited due to excessive bleeding from RLE and minimal from LLE, which happened as soon as pt sat EOB. Most likely, pt will need at least min A for all LB ADL's and mod A + for transfers.     Vision Baseline Vision/History: 1 Wears glasses Ability to See in Adequate Light: 0 Adequate Patient Visual Report: No change from baseline Vision Assessment?: No apparent visual deficits     Perception Perception Perception Tested?: No   Praxis Praxis  Praxis tested?: Not tested    Pertinent Vitals/Pain Pain Assessment: Faces Faces Pain Scale: Hurts little more Pain Location: unable to report Pain Descriptors / Indicators: Grimacing;Discomfort Pain Intervention(s): Limited activity within patient's tolerance;Monitored during session;Repositioned     Hand Dominance Right   Extremity/Trunk Assessment Upper Extremity Assessment Upper Extremity Assessment: Generalized weakness   Lower Extremity Assessment Lower Extremity Assessment: Defer to PT evaluation   Cervical / Trunk Assessment Cervical / Trunk Assessment: Kyphotic   Communication Communication Communication: Expressive  difficulties;HOH   Cognition Arousal/Alertness: Awake/alert Behavior During Therapy: Flat affect Overall Cognitive Status: Impaired/Different from baseline Area of Impairment: Attention;Memory;Safety/judgement;Awareness;Problem solving                   Current Attention Level: Sustained Memory: Decreased short-term memory Following Commands: Follows one step commands with increased time Safety/Judgement: Decreased awareness of safety;Decreased awareness of deficits Awareness: Emergent Problem Solving: Slow processing;Requires verbal cues General Comments: Pt believes that nothing is wrong and she should be able to go home today with no assist. Pt unable to come to sitting EOB without mod A.   General Comments  VSS, pt with unna boots on BLE, blood dripping from RLE onto floor upon sitting at edge of bed. RN made aware, OT assists with dressing reinforcement    Exercises     Shoulder Instructions      Home Living Family/patient expects to be discharged to:: Private residence Living Arrangements: Spouse/significant other Available Help at Discharge: Family;Personal care attendant (spouse is 74 and unable to provide significant physical assistance. PCA M/W/F for 4 hours) Type of Home: House Home Access: Level entry     Home Layout: Two level;Able to live on main level with bedroom/bathroom Alternate Level Stairs-Number of Steps: 13 Alternate Level Stairs-Rails: Right Bathroom Shower/Tub: Tub/shower unit   Bathroom Toilet: Handicapped height Bathroom Accessibility: Yes   Home Equipment: Environmental consultant - 4 wheels;Bedside commode;Walker - 2 wheels;Wheelchair - manual;Hospital bed;Tub bench          Prior Functioning/Environment Level of Independence: Needs assistance  Gait / Transfers Assistance Needed: daughter in law reports pt had been ambulating with minG assist and RW prior to recent decline of LE wounds ADL's / Homemaking Assistance Needed: Aides assist with bathing 3  times per week, family assists as needed with all IADL'            OT Problem List: Decreased strength;Decreased activity tolerance;Impaired balance (sitting and/or standing);Decreased coordination;Decreased cognition;Decreased safety awareness;Decreased knowledge of use of DME or AE      OT Treatment/Interventions: Self-care/ADL training;Therapeutic exercise;Energy conservation;DME and/or AE instruction;Therapeutic activities;Patient/family education;Balance training    OT Goals(Current goals can be found in the care plan section) Acute Rehab OT Goals Patient Stated Goal: to improve mobility and reduce falls risk OT Goal Formulation: With patient/family Time For Goal Achievement: 01/05/21 Potential to Achieve Goals: Good ADL Goals Pt Will Perform Grooming: with supervision;standing Pt Will Perform Lower Body Bathing: with min guard assist;sitting/lateral leans;sit to/from stand Pt Will Perform Lower Body Dressing: with min guard assist;sitting/lateral leans;sit to/from stand Pt Will Transfer to Toilet: with min guard assist;ambulating Pt Will Perform Toileting - Clothing Manipulation and hygiene: with min guard assist;sitting/lateral leans;sit to/from stand  OT Frequency: Min 2X/week   Barriers to D/C:            Co-evaluation              AM-PAC OT "6 Clicks" Daily Activity     Outcome Measure Help from another person  eating meals?: None Help from another person taking care of personal grooming?: A Little Help from another person toileting, which includes using toliet, bedpan, or urinal?: A Lot Help from another person bathing (including washing, rinsing, drying)?: A Lot Help from another person to put on and taking off regular upper body clothing?: A Little Help from another person to put on and taking off regular lower body clothing?: A Lot 6 Click Score: 16   End of Session    Activity Tolerance: Patient tolerated treatment well;Other (comment) (SEssion limited  due to BLE bleeding) Patient left: in bed;with call bell/phone within reach;with bed alarm set;with family/visitor present  OT Visit Diagnosis: Unsteadiness on feet (R26.81);Other abnormalities of gait and mobility (R26.89);Muscle weakness (generalized) (M62.81)                Time: 7159-5396 OT Time Calculation (min): 25 min Charges:  OT General Charges $OT Visit: 1 Visit OT Evaluation $OT Eval Moderate Complexity: 1 Mod OT Treatments $Therapeutic Activity: 8-22 mins  Artemis Koller H., OTR/L Acute Rehabilitation  Alyona Romack Elane Kimberla Driskill 12/22/2020, 12:09 PM

## 2020-12-22 NOTE — Progress Notes (Signed)
PROGRESS NOTE  Jacqueline Richmond ZOX:[id number] DOB: 03-24-1946 DOA: 12/18/2020 PCP: Bernerd Limbo, MD  HPI/Recap of past 55 hours: 75 year old female with history of type 2 diabetes mellitus, hypertension, hyperlipidemia, chronic kidney disease stage IV, gout diverticulosis, recent hospitalization for wound debridement application of wound VAC previously operated T4 fracture site.  She was sent for admission by her podiatrist because of concerns for osteomyelitis of the right heel with possible venous insufficiency.  Subjective: December 22, 2020: Patient seen and examined at bedside she is very pleasant she states she denies any history pain she states she feels well  Assessment/Plan: Active Problems:   Leg wound, right   Diabetic foot (HCC)   Cellulitis of right lower leg   Venous insufficiency   1.  Decubitus ulcer of the heel with venous ulceration/cellulitis She has been evaluated by vascular surgery and she is not a good candidate for revascularized recommendation The advised to continue Unna boot therapy and wound care and if the wound queen continues to deteriorate will consider angiography Also IV antibiotics was discontinued and she was switched to oral Keflex for 7 days  2.  Chronic appearing PAD based on ABI with absence of flow on tibial arteries Continue aspirin Patient not on statin because of intolerance to statins  3.  Possible metabolic encephalopathy in the setting of AKI on chronic kidney disease MRI was done and it shows no acute abnormality but extensive chronic small vessel ischemic disease Continue PT and OT evaluation  4.  Generalized weakness/falls resulting in T10 fracture in May 2022 status post hospitalization status post SNF for rehab  5.  Acute kidney disease  on chronic kidney disease stage IV Metabolic acidosis due to chronic kidney disease Her most recent BUN/creatinine is 46/1.9  6.  Type 2 diabetes mellitus with A1c of 5.4 Holding home  Januvia Continue sliding scale  7.  Hypertension well-controlled on atenolol and clonidine  8.  Hyperlipidemia continue statins History of Gout allopurinol is on hold Macrocytic anemia may be secondary to chronic disease/CKD continue to monitor H&H  Code Status: Full  Severity of Illness: The appropriate patient status for this patient is INPATIENT. Inpatient status is judged to be reasonable and necessary in order to provide the required intensity of service to ensure the patient's safety. The patient's presenting symptoms, physical exam findings, and initial radiographic and laboratory data in the context of their chronic comorbidities is felt to place them at high risk for further clinical deterioration. Furthermore, it is not anticipated that the patient will be medically stable for discharge from the hospital within 2 midnights of admission. The following factors support the patient status of inpatient.   " Continue to require inpatient care  * I certify that at the point of admission it is my clinical judgment that the patient will require inpatient hospital care spanning beyond 2 midnights from the point of admission due to high intensity of service, high risk for further deterioration and high frequency of surveillance required.*   Family Communication: Discussed with patient  Disposition Plan:   Status is: Inpatient   Dispo: The patient is from: Home              Anticipated d/c is to: SNF              Anticipated d/c date is:               Patient currently not medically stable for discharge  Consultants: Podiatry  Procedures: None  Antimicrobials: Cephalexin  DVT prophylaxis: Subcu heparin   Objective: Vitals:   12/21/20 1756 12/21/20 2032 12/22/20 0449 12/22/20 0921  BP: (!) 110/59 (!) 148/59 (!) 146/65 (!) 141/62  Pulse: 76 76 88 82  Resp: 18 18 18 18   Temp: 98 F (36.7 C) 99.2 F (37.3 C) 98.3 F (36.8 C) 98 F (36.7 C)  TempSrc: Oral Oral Oral Oral   SpO2: 99% 99% 99% 98%  Weight:      Height:        Intake/Output Summary (Last 24 hours) at 12/22/2020 1133 Last data filed at 12/22/2020 1035 Gross per 24 hour  Intake 1928.18 ml  Output 1150 ml  Net 778.18 ml   Filed Weights   12/18/20 1239 12/20/20 2116  Weight: 66.6 kg 66.6 kg   Body mass index is 25.2 kg/m.  Exam:  General: 75 y.o. year-old female well developed well nourished in no acute distress.  Alert and oriented x3. Cardiovascular: Regular rate and rhythm with no rubs or gallops.  No thyromegaly or JVD noted.   Respiratory: Clear to auscultation with no wheezes or rales. Good inspiratory effort. Abdomen: Soft nontender nondistended with normal bowel sounds x4 quadrants. Musculoskeletal: No lower extremity edema. 2/4 pulses in all 4 extremities. Skin: Bilateral lower extremity with Unna boots also ulcer in the vertebral column Psychiatry: Mood is appropriate for condition and setting Neurology: Alert oriented pleasant    Data Reviewed: CBC: Recent Labs  Lab 12/18/20 1323 12/19/20 0532 12/20/20 0451 12/21/20 0326 12/22/20 0326  WBC 10.1 11.0* 10.2 11.1* 8.7  HGB 10.4* 9.6* 9.7* 10.5* 9.7*  HCT 33.9* 31.2* 30.6* 34.0* 31.9*  MCV 78.7* 79.2* 78.7* 79.4* 80.4  PLT 313 296 273 290 161   Basic Metabolic Panel: Recent Labs  Lab 12/18/20 1323 12/19/20 0532 12/20/20 0451 12/21/20 0326 12/22/20 0326  NA 137 142 139 141 143  K 5.9* 5.6* 5.0 4.2 4.0  CL 106 112* 110 112* 116*  CO2 20* 21* 20* 20* 20*  GLUCOSE 157* 101* 88 114* 88  BUN 106* 110* 81* 60* 46*  CREATININE 3.50* 2.93* 2.78* 2.24* 1.91*  CALCIUM 10.1 9.4 8.9 9.0 8.9   GFR: Estimated Creatinine Clearance: 24.3 mL/min (A) (by C-G formula based on SCr of 1.91 mg/dL (H)). Liver Function Tests: Recent Labs  Lab 12/18/20 1323  AST 10*  ALT 7  ALKPHOS 99  BILITOT 0.3  PROT 7.7  ALBUMIN 3.5   No results for input(s): LIPASE, AMYLASE in the last 168 hours. Recent Labs  Lab 12/21/20 1140   AMMONIA 15   Coagulation Profile: No results for input(s): INR, PROTIME in the last 168 hours. Cardiac Enzymes: No results for input(s): CKTOTAL, CKMB, CKMBINDEX, TROPONINI in the last 168 hours. BNP (last 3 results) No results for input(s): PROBNP in the last 8760 hours. HbA1C: No results for input(s): HGBA1C in the last 72 hours. CBG: Recent Labs  Lab 12/21/20 0638 12/21/20 1106 12/21/20 1633 12/21/20 2116 12/22/20 0649  GLUCAP 108* 97 105* 84 137*   Lipid Profile: No results for input(s): CHOL, HDL, LDLCALC, TRIG, CHOLHDL, LDLDIRECT in the last 72 hours. Thyroid Function Tests: Recent Labs    12/21/20 1140  TSH 1.576   Anemia Panel: Recent Labs    12/21/20 1140  VITAMINB12 744   Urine analysis:    Component Value Date/Time   COLORURINE YELLOW 12/21/2020 1157   APPEARANCEUR CLEAR 12/21/2020 1157   LABSPEC 1.012 12/21/2020 1157   PHURINE 5.0 12/21/2020 1157   GLUCOSEU NEGATIVE  12/21/2020 [place] 12/21/2020 [place] 12/21/2020 [place] 12/21/2020 1157   PROTEINUR 30 (A) 12/21/2020 1157   UROBILINOGEN 0.2 08/29/2011 1820   NITRITE NEGATIVE 12/21/2020 1157   LEUKOCYTESUR NEGATIVE 12/21/2020 1157   Sepsis Labs: @LABRCNTIP (procalcitonin:4,lacticidven:4)  ) Recent Results (from the past 240 hour(s))  Resp Panel by RT-PCR (Flu A&B, Covid) Nasopharyngeal Swab     Status: None   Collection Time: 12/18/20  1:23 PM   Specimen: Nasopharyngeal Swab; Nasopharyngeal(NP) swabs in vial transport medium  Result Value Ref Range Status   SARS Coronavirus 2 by RT PCR NEGATIVE NEGATIVE Final    Comment: (NOTE) SARS-CoV-2 target nucleic acids are NOT DETECTED.  The SARS-CoV-2 RNA is generally detectable in upper respiratory specimens during the acute phase of infection. The lowest concentration of SARS-CoV-2 viral copies this assay can detect is 138 copies/mL. A negative result does not preclude SARS-Cov-2 infection and  should not be used as the sole basis for treatment or other patient management decisions. A negative result may occur with  improper specimen collection/handling, submission of specimen other than nasopharyngeal swab, presence of viral mutation(s) within the areas targeted by this assay, and inadequate number of viral copies(<138 copies/mL). A negative result must be combined with clinical observations, patient history, and epidemiological information. The expected result is Negative.  Fact Sheet for Patients:  EntrepreneurPulse.com.au  Fact Sheet for Healthcare Providers:  IncredibleEmployment.be  This test is no t yet approved or cleared by the [place] FDA and  has been authorized for detection and/or diagnosis of SARS-CoV-2 by FDA under an Emergency Use Authorization (EUA). This EUA will remain  in effect (meaning this test can be used) for the duration of the COVID-19 declaration under Section 564(b)(1) of the Act, 21 U.S.C.section 360bbb-3(b)(1), unless the authorization is terminated  or revoked sooner.       Influenza A by PCR NEGATIVE NEGATIVE Final   Influenza B by PCR NEGATIVE NEGATIVE Final    Comment: (NOTE) The Xpert Xpress SARS-CoV-2/FLU/RSV plus assay is intended as an aid in the diagnosis of influenza from Nasopharyngeal swab specimens and should not be used as a sole basis for treatment. Nasal washings and aspirates are unacceptable for Xpert Xpress SARS-CoV-2/FLU/RSV testing.  Fact Sheet for Patients: EntrepreneurPulse.com.au  Fact Sheet for Healthcare Providers: IncredibleEmployment.be  This test is not yet approved or cleared by the [place] FDA and has been authorized for detection and/or diagnosis of SARS-CoV-2 by FDA under an Emergency Use Authorization (EUA). This EUA will remain in effect (meaning this test can be used) for the duration of the COVID-19 declaration  under Section 564(b)(1) of the Act, 21 U.S.C. section 360bbb-3(b)(1), unless the authorization is terminated or revoked.  Performed at [facility], [address] [place], [place] [address]   Blood culture (routine x 2)     Status: None (Preliminary result)   Collection Time: 12/18/20  1:25 PM   Specimen: Right Antecubital; Blood  Result Value Ref Range Status   Specimen Description   Final    RIGHT ANTECUBITAL Performed at [facility], [address] [place], [place] [address]    Special Requests   Final    Blood Culture adequate volume Performed at [facility], [address], [place], [place] [address]    Culture   Final    NO GROWTH 4 DAYS Performed at [facility], [place] [address] [place], [place] [address]  Report Status PENDING  Incomplete  Blood culture (routine x 2)     Status: None (Preliminary result)   Collection Time: 12/18/20  1:44 PM   Specimen: Right Antecubital; Blood  Result Value Ref Range Status   Specimen Description   Final    RIGHT ANTECUBITAL Performed at [facility], [address], [place], [place] [address]    Special Requests   Final    Blood Culture adequate volume Performed at [facility], [address], [place], [place] [address]    Culture   Final    NO GROWTH 4 DAYS Performed at [facility], [place] [address] [place], [place] [address]    Report Status PENDING  Incomplete      Studies: MR BRAIN WO CONTRAST  Result Date: 12/21/2020 CLINICAL DATA:  Delirium.  Expressive aphasia. EXAM: MRI HEAD WITHOUT CONTRAST TECHNIQUE: Multiplanar, multiecho pulse sequences of the brain and surrounding structures were obtained without intravenous contrast. COMPARISON:  08/04/2020 FINDINGS: Brain: There is no evidence of an acute infarct, mass, midline shift, or extra-axial fluid collection. Chronic microhemorrhages are noted in the  right pons and right lentiform nucleus. Widespread T2 hyperintensities are again seen in the cerebral white matter bilaterally which are nonspecific but compatible with extensive chronic small vessel ischemic disease. 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 Cystic foci are again noted in the globe I palate I suggestive of remote insults. There is mild cerebral atrophy. Vascular: Major intracranial vascular flow voids are preserved. Skull and upper cervical spine: Unremarkable bone marrow signal. Sinuses/Orbits: Unremarkable orbits. Minimal mucosal thickening in the paranasal sinuses. Clear mastoid air cells. Other: None. IMPRESSION: 1. No acute intracranial abnormality. 2. Extensive chronic small vessel ischemic disease. Electronically Signed   By: Logan Bores M.D.   On: 12/21/2020 15:18    Scheduled Meds:  aspirin EC  81 mg Oral Daily   atenolol  100 mg Oral Daily   cephALEXin  500 mg Oral Q12H   cloNIDine  0.2 mg Oral BID   heparin  5,000 Units Subcutaneous Q8H   insulin aspart  0-5 Units Subcutaneous QHS   insulin aspart  0-9 Units Subcutaneous TID WC   liver oil-zinc oxide   Topical TID   senna  1 tablet Oral BID    Continuous Infusions:  sodium chloride Stopped (12/21/20 2036)   sodium chloride 30 mL/hr at 12/22/20 0400     LOS: 4 days     Cristal Deer, MD [facility]  To reach me or the doctor on call, go to: [email or web address] Password [facility]  12/22/2020, 11:33 AM

## 2020-12-23 DIAGNOSIS — L03115 Cellulitis of right lower limb: Secondary | ICD-10-CM | POA: Diagnosis not present

## 2020-12-23 DIAGNOSIS — I872 Venous insufficiency (chronic) (peripheral): Secondary | ICD-10-CM | POA: Diagnosis not present

## 2020-12-23 DIAGNOSIS — E875 Hyperkalemia: Secondary | ICD-10-CM | POA: Diagnosis not present

## 2020-12-23 DIAGNOSIS — S81801D Unspecified open wound, right lower leg, subsequent encounter: Secondary | ICD-10-CM | POA: Diagnosis not present

## 2020-12-23 DIAGNOSIS — E118 Type 2 diabetes mellitus with unspecified complications: Secondary | ICD-10-CM | POA: Diagnosis not present

## 2020-12-23 DIAGNOSIS — E1151 Type 2 diabetes mellitus with diabetic peripheral angiopathy without gangrene: Secondary | ICD-10-CM | POA: Diagnosis not present

## 2020-12-23 LAB — BASIC METABOLIC PANEL
Anion gap: 6 (ref 5–15)
BUN: 37 mg/dL — ABNORMAL HIGH (ref 8–23)
CO2: 20 mmol/L — ABNORMAL LOW (ref 22–32)
Calcium: 8.8 mg/dL — ABNORMAL LOW (ref 8.9–10.3)
Chloride: 114 mmol/L — ABNORMAL HIGH (ref 98–111)
Creatinine, Ser: 1.53 mg/dL — ABNORMAL HIGH (ref 0.44–1.00)
GFR, Estimated: 35 mL/min — ABNORMAL LOW (ref >60)
Glucose, Bld: 91 mg/dL (ref 70–99)
Potassium: 3.8 mmol/L (ref 3.5–5.1)
Sodium: 140 mmol/L (ref 135–145)

## 2020-12-23 LAB — CBC
HCT: 29.9 % — ABNORMAL LOW (ref 36.0–46.0)
Hemoglobin: 8.8 g/dL — ABNORMAL LOW (ref 12.0–15.0)
MCH: 24 pg — ABNORMAL LOW (ref 26.0–34.0)
MCHC: 29.4 g/dL — ABNORMAL LOW (ref 30.0–36.0)
MCV: 81.5 fL (ref 80.0–100.0)
Platelets: 205 10*3/uL (ref 150–400)
RBC: 3.67 MIL/uL — ABNORMAL LOW (ref 3.87–5.11)
RDW: 17.3 % — ABNORMAL HIGH (ref 11.5–15.5)
WBC: 8.3 10*3/uL (ref 4.0–10.5)
nRBC: 0 % (ref 0.0–0.2)

## 2020-12-23 LAB — GLUCOSE, CAPILLARY
Glucose-Capillary: 105 mg/dL — ABNORMAL HIGH (ref 70–99)
Glucose-Capillary: 123 mg/dL — ABNORMAL HIGH (ref 70–99)
Glucose-Capillary: 128 mg/dL — ABNORMAL HIGH (ref 70–99)
Glucose-Capillary: 128 mg/dL — ABNORMAL HIGH (ref 70–99)

## 2020-12-23 LAB — CULTURE, BLOOD (ROUTINE X 2)
Culture: NO GROWTH
Culture: NO GROWTH
Special Requests: ADEQUATE
Special Requests: ADEQUATE

## 2020-12-23 NOTE — Plan of Care (Signed)
  Problem: Clinical Measurements: Goal: Respiratory complications will improve Outcome: Adequate for Discharge   

## 2020-12-23 NOTE — Progress Notes (Addendum)
PROGRESS NOTE  Jacqueline Richmond POE:[id number] DOB: 24-Dec-1945 DOA: 12/18/2020 PCP: Bernerd Limbo, MD  HPI/Recap of past 47 hours: 75 year old female with history of type 2 diabetes mellitus, hypertension, hyperlipidemia, chronic kidney disease stage IV, gout diverticulosis, recent hospitalization for wound debridement application of wound VAC previously operated T4 fracture site.  She was sent for admission by her podiatrist because of concerns for osteomyelitis of the right heel with possible venous insufficiency.  Subjective: December 22, 2020: Patient seen and examined at bedside she is very pleasant she states she denies any history pain she states she feels well  December 23, 2020 Patient seen and examined at bedside.  She stated she is doing well she is eating well denies any new complaint.  But she was not aware that she will be going to SNF  Assessment/Plan: Active Problems:   Leg wound, right   Diabetic foot (HCC)   Cellulitis of right lower leg   Venous insufficiency   1.  Decubitus ulcer of the heel with venous ulceration/cellulitis She has been evaluated by vascular surgery and she is not a good candidate for revascularized recommendation The advised to continue Unna boot therapy and wound care and if the wound queen continues to deteriorate will consider angiography Also IV antibiotics was discontinued and she was switched to oral Keflex for 7 days  2.  Chronic appearing PAD based on ABI with absence of flow on tibial arteries Continue aspirin Patient not on statin because of intolerance to statins  3.  Possible metabolic encephalopathy in the setting of AKI on chronic kidney disease MRI was done and it shows no acute abnormality but extensive chronic small vessel ischemic disease Continue PT and OT evaluation  4.  Generalized weakness/falls resulting in T10 fracture in May 2022 status post hospitalization status post SNF for rehab  5.  Acute kidney disease  on chronic  kidney disease stage IV Metabolic acidosis due to chronic kidney disease Her most recent BUN/creatinine is 46/1.9  6.  Type 2 diabetes mellitus with A1c of 5.4 Holding home Januvia Continue sliding scale  7.  Hypertension well-controlled on atenolol and clonidine  8.  Hyperlipidemia continue statins History of Gout allopurinol is on hold Macrocytic anemia may be secondary to chronic disease/CKD continue to monitor H&H  Code Status: Full  Severity of Illness: The appropriate patient status for this patient is INPATIENT. Inpatient status is judged to be reasonable and necessary in order to provide the required intensity of service to ensure the patient's safety. The patient's presenting symptoms, physical exam findings, and initial radiographic and laboratory data in the context of their chronic comorbidities is felt to place them at high risk for further clinical deterioration. Furthermore, it is not anticipated that the patient will be medically stable for discharge from the hospital within 2 midnights of admission. The following factors support the patient status of inpatient.   " Continue to require inpatient care  * I certify that at the point of admission it is my clinical judgment that the patient will require inpatient hospital care spanning beyond 2 midnights from the point of admission due to high intensity of service, high risk for further deterioration and high frequency of surveillance required.*   Family Communication: Discussed with patient  Disposition Plan:   Status is: Inpatient   Dispo: The patient is from: Home              Anticipated d/c is to: SNF  Anticipated d/c date is:               Patient currently not medically stable for discharge  Consultants: Podiatry  Procedures: None  Antimicrobials: Cephalexin  DVT prophylaxis: Subcu heparin   Objective: Vitals:   12/22/20 1951 12/23/20 0430 12/23/20 0931 12/23/20 1620  BP: (!) 151/61 (!)  154/68 (!) 148/60 (!) 166/72  Pulse: 82 78 77 80  Resp: 18 18 16 18   Temp: 99.1 F (37.3 C) 98.1 F (36.7 C) 98 F (36.7 C) 99 F (37.2 C)  TempSrc: Oral Oral Oral Oral  SpO2: 98% 100% 100% 98%  Weight:      Height:        Intake/Output Summary (Last 24 hours) at 12/23/2020 1914 Last data filed at 12/23/2020 1700 Gross per 24 hour  Intake 933.67 ml  Output 0 ml  Net 933.67 ml    Filed Weights   12/18/20 1239 12/20/20 2116  Weight: 66.6 kg 66.6 kg   Body mass index is 25.2 kg/m.  Exam:  General: 75 y.o. year-old female well developed well nourished in no acute distress.  Alert and oriented x3. Cardiovascular: Regular rate and rhythm with no rubs or gallops.  No thyromegaly or JVD noted.   Respiratory: Clear to auscultation with no wheezes or rales. Good inspiratory effort. Abdomen: Soft nontender nondistended with normal bowel sounds x4 quadrants. Musculoskeletal: No lower extremity edema. 2/4 pulses in all 4 extremities. Skin: Bilateral lower extremity with Unna boots also ulcer in the vertebral column Psychiatry: Mood is appropriate for condition and setting Neurology: Alert oriented pleasant    Data Reviewed: CBC: Recent Labs  Lab 12/19/20 0532 12/20/20 0451 12/21/20 0326 12/22/20 0326 12/23/20 0158  WBC 11.0* 10.2 11.1* 8.7 8.3  HGB 9.6* 9.7* 10.5* 9.7* 8.8*  HCT 31.2* 30.6* 34.0* 31.9* 29.9*  MCV 79.2* 78.7* 79.4* 80.4 81.5  PLT 296 273 290 255 161    Basic Metabolic Panel: Recent Labs  Lab 12/19/20 0532 12/20/20 0451 12/21/20 0326 12/22/20 0326 12/23/20 0158  NA 142 139 141 143 140  K 5.6* 5.0 4.2 4.0 3.8  CL 112* 110 112* 116* 114*  CO2 21* 20* 20* 20* 20*  GLUCOSE 101* 88 114* 88 91  BUN 110* 81* 60* 46* 37*  CREATININE 2.93* 2.78* 2.24* 1.91* 1.53*  CALCIUM 9.4 8.9 9.0 8.9 8.8*    GFR: Estimated Creatinine Clearance: 30.3 mL/min (A) (by C-G formula based on SCr of 1.53 mg/dL (H)). Liver Function Tests: Recent Labs  Lab  12/18/20 1323  AST 10*  ALT 7  ALKPHOS 99  BILITOT 0.3  PROT 7.7  ALBUMIN 3.5    No results for input(s): LIPASE, AMYLASE in the last 168 hours. Recent Labs  Lab 12/21/20 1140  AMMONIA 15    Coagulation Profile: No results for input(s): INR, PROTIME in the last 168 hours. Cardiac Enzymes: No results for input(s): CKTOTAL, CKMB, CKMBINDEX, TROPONINI in the last 168 hours. BNP (last 3 results) No results for input(s): PROBNP in the last 8760 hours. HbA1C: No results for input(s): HGBA1C in the last 72 hours. CBG: Recent Labs  Lab 12/22/20 1611 12/22/20 2012 12/23/20 0635 12/23/20 1124 12/23/20 1620  GLUCAP 120* 203* 105* 123* 128*    Lipid Profile: No results for input(s): CHOL, HDL, LDLCALC, TRIG, CHOLHDL, LDLDIRECT in the last 72 hours. Thyroid Function Tests: Recent Labs    12/21/20 1140  TSH 1.576    Anemia Panel: Recent Labs    12/21/20 1140  VITAMINB12  744    Urine analysis:    Component Value Date/Time   COLORURINE YELLOW 12/21/2020 1157   APPEARANCEUR CLEAR 12/21/2020 1157   LABSPEC 1.012 12/21/2020 1157   PHURINE 5.0 12/21/2020 1157   GLUCOSEU NEGATIVE 12/21/2020 1157   HGBUR NEGATIVE 12/21/2020 [place] 12/21/2020 [place] 12/21/2020 1157   PROTEINUR 30 (A) 12/21/2020 1157   UROBILINOGEN 0.2 08/29/2011 1820   NITRITE NEGATIVE 12/21/2020 1157   LEUKOCYTESUR NEGATIVE 12/21/2020 1157   Sepsis Labs: @LABRCNTIP (procalcitonin:4,lacticidven:4)  ) Recent Results (from the past 240 hour(s))  Resp Panel by RT-PCR (Flu A&B, Covid) Nasopharyngeal Swab     Status: None   Collection Time: 12/18/20  1:23 PM   Specimen: Nasopharyngeal Swab; Nasopharyngeal(NP) swabs in vial transport medium  Result Value Ref Range Status   SARS Coronavirus 2 by RT PCR NEGATIVE NEGATIVE Final    Comment: (NOTE) SARS-CoV-2 target nucleic acids are NOT DETECTED.  The SARS-CoV-2 RNA is generally detectable in upper  respiratory specimens during the acute phase of infection. The lowest concentration of SARS-CoV-2 viral copies this assay can detect is 138 copies/mL. A negative result does not preclude SARS-Cov-2 infection and should not be used as the sole basis for treatment or other patient management decisions. A negative result may occur with  improper specimen collection/handling, submission of specimen other than nasopharyngeal swab, presence of viral mutation(s) within the areas targeted by this assay, and inadequate number of viral copies(<138 copies/mL). A negative result must be combined with clinical observations, patient history, and epidemiological information. The expected result is Negative.  Fact Sheet for Patients:  EntrepreneurPulse.com.au  Fact Sheet for Healthcare Providers:  IncredibleEmployment.be  This test is no t yet approved or cleared by the [place] FDA and  has been authorized for detection and/or diagnosis of SARS-CoV-2 by FDA under an Emergency Use Authorization (EUA). This EUA will remain  in effect (meaning this test can be used) for the duration of the COVID-19 declaration under Section 564(b)(1) of the Act, 21 U.S.C.section 360bbb-3(b)(1), unless the authorization is terminated  or revoked sooner.       Influenza A by PCR NEGATIVE NEGATIVE Final   Influenza B by PCR NEGATIVE NEGATIVE Final    Comment: (NOTE) The Xpert Xpress SARS-CoV-2/FLU/RSV plus assay is intended as an aid in the diagnosis of influenza from Nasopharyngeal swab specimens and should not be used as a sole basis for treatment. Nasal washings and aspirates are unacceptable for Xpert Xpress SARS-CoV-2/FLU/RSV testing.  Fact Sheet for Patients: EntrepreneurPulse.com.au  Fact Sheet for Healthcare Providers: IncredibleEmployment.be  This test is not yet approved or cleared by the [place] FDA and has been  authorized for detection and/or diagnosis of SARS-CoV-2 by FDA under an Emergency Use Authorization (EUA). This EUA will remain in effect (meaning this test can be used) for the duration of the COVID-19 declaration under Section 564(b)(1) of the Act, 21 U.S.C. section 360bbb-3(b)(1), unless the authorization is terminated or revoked.  Performed at [facility], [address], [place], [place] [address]   Blood culture (routine x 2)     Status: None   Collection Time: 12/18/20  1:25 PM   Specimen: Right Antecubital; Blood  Result Value Ref Range Status   Specimen Description   Final    RIGHT ANTECUBITAL Performed at [facility], [address] [place], [place] [address]    Special Requests   Final    Blood Culture adequate volume Performed at [facility], [address], [place], [place] [address]    Culture   Final    NO GROWTH 5 DAYS Performed at [facility], [place] [address] [place], [place] [address]    Report Status 12/23/2020 FINAL  Final  Blood culture (routine x 2)     Status: None   Collection Time: 12/18/20  1:44 PM   Specimen: Right Antecubital; Blood  Result Value Ref Range Status   Specimen Description   Final    RIGHT ANTECUBITAL Performed at [facility], [address], [place], [place] [address]    Special Requests   Final    Blood Culture adequate volume Performed at [facility], [address] [place], [place] [address]    Culture   Final    NO GROWTH 5 DAYS Performed at [facility], [place] [address], [place], [place] [address]    Report Status 12/23/2020 FINAL  Final  Urine Culture     Status: None   Collection Time: 12/21/20 11:57 AM   Specimen: Urine, Clean Catch  Result Value Ref Range Status   Specimen Description URINE, CLEAN CATCH  Final   Special Requests NONE  Final   Culture   Final    NO GROWTH Performed at [facility], [place] [address], [place], [place] [address]    Report Status 12/22/2020 FINAL  Final      Studies: No results found.  Scheduled Meds:  aspirin EC  81 mg Oral Daily   atenolol  100 mg Oral Daily   cephALEXin  500 mg Oral Q12H   cloNIDine  0.2 mg Oral BID   heparin  5,000 Units Subcutaneous Q8H   insulin aspart  0-5 Units Subcutaneous QHS   insulin aspart  0-9 Units Subcutaneous TID WC   liver oil-zinc oxide   Topical TID   senna  1 tablet Oral BID    Continuous Infusions:  sodium chloride Stopped (12/21/20 2036)   sodium chloride 30 mL/hr at 12/23/20 0637     LOS: 5 days     Cristal Deer, MD [facility]  To reach me or the doctor on call, go to: [email or web address] Password [facility]  12/23/2020, 7:14 PM

## 2020-12-24 DIAGNOSIS — E875 Hyperkalemia: Secondary | ICD-10-CM | POA: Diagnosis not present

## 2020-12-24 DIAGNOSIS — E1151 Type 2 diabetes mellitus with diabetic peripheral angiopathy without gangrene: Secondary | ICD-10-CM | POA: Diagnosis not present

## 2020-12-24 DIAGNOSIS — I872 Venous insufficiency (chronic) (peripheral): Secondary | ICD-10-CM | POA: Diagnosis not present

## 2020-12-24 LAB — CBC
HCT: 29.5 % — ABNORMAL LOW (ref 36.0–46.0)
Hemoglobin: 8.9 g/dL — ABNORMAL LOW (ref 12.0–15.0)
MCH: 24.3 pg — ABNORMAL LOW (ref 26.0–34.0)
MCHC: 30.2 g/dL (ref 30.0–36.0)
MCV: 80.6 fL (ref 80.0–100.0)
Platelets: 186 10*3/uL (ref 150–400)
RBC: 3.66 MIL/uL — ABNORMAL LOW (ref 3.87–5.11)
RDW: 17.1 % — ABNORMAL HIGH (ref 11.5–15.5)
WBC: 9.3 10*3/uL (ref 4.0–10.5)
nRBC: 0 % (ref 0.0–0.2)

## 2020-12-24 LAB — BASIC METABOLIC PANEL
Anion gap: 6 (ref 5–15)
BUN: 29 mg/dL — ABNORMAL HIGH (ref 8–23)
CO2: 21 mmol/L — ABNORMAL LOW (ref 22–32)
Calcium: 8.5 mg/dL — ABNORMAL LOW (ref 8.9–10.3)
Chloride: 110 mmol/L (ref 98–111)
Creatinine, Ser: 1.58 mg/dL — ABNORMAL HIGH (ref 0.44–1.00)
GFR, Estimated: 34 mL/min — ABNORMAL LOW (ref >60)
Glucose, Bld: 133 mg/dL — ABNORMAL HIGH (ref 70–99)
Potassium: 3.7 mmol/L (ref 3.5–5.1)
Sodium: 137 mmol/L (ref 135–145)

## 2020-12-24 LAB — GLUCOSE, CAPILLARY
Glucose-Capillary: 93 mg/dL (ref 70–99)
Glucose-Capillary: 98 mg/dL (ref 70–99)
Glucose-Capillary: 82 mg/dL (ref 70–99)
Glucose-Capillary: 123 mg/dL — ABNORMAL HIGH (ref 70–99)

## 2020-12-24 NOTE — Progress Notes (Signed)
Made provider aware that legs were bleeding while working with OT.

## 2020-12-24 NOTE — Progress Notes (Signed)
PROGRESS NOTE    Jacqueline Richmond  YCX:[id number] DOB: April 08, 1945 DOA: 12/18/2020 PCP: Bernerd Limbo, MD   Chief Complaint  Patient presents with   leg wound   Brief Narrative/Hospital Course: 89 old female with history of T2DM, HTN, HLD, CKD stage IV, gout, diverticulosis recently hospitalized 6/9-08/2012 for wound debridement and application of wound VAC on a previously operated T10 fracture site, has been in [place] [facility] to for 2 months and ultimately discharged home few weeks ago. Patient states that while at [facility], she developed wound and ulcer in her right heel.On 9/27, patient presented to podiatry Dr. Pasty Arch office for evaluation of worsening wound on the right heel.  Previously, she was tried on Keflex and doxycycline by her PCP without improvement.   In the podiatrist office, she was noted to have redness around the wound with drainage, full-thickness ulceration of the right heel.  There was a a concern of osteomyelitis of the right heel with possible venous insufficiency.  Patient was directed to ED at [facility].    In the ED, patient was afebrile, hemodynamically stable, labs remarkable for hyperkalemia AKI X-rays of right tibia-fibula and foot were obtained.  It did not show any evidence of osteomyelitis.  Admitted for further work-up and management at [facility] and was transferred to [facility] for vascular evaluation due to abnormal ABI.  Seen by vascular felt to be chronic PVD and venous ulceration advised whenever wound care and outpatient follow-up.  Patient was placed on IV antibiotic briefly and subsequently changed to p.o. and complete the course.  Wound is healing nicely unknown output patient will need skilled nursing for replacement and will need outpatient wound care follow-up Hospital course complicated with acute metabolic encephalopathy  Subjective: Seen this morning she is alert awake much more oriented compared to last week knows her current location current year current  president or date of birth.  Assessment & Plan:  Bilateral lower extremity ulceration and posterior heel ulceration with cellulitis  venous ulceration : Ulceration in the setting of PAD based on ABI.  Noncompressible tibial arteries on ABI.  Seen by vascular -felt not a good revascularization candidate, plan is to continue wound care, offloading, Unna boot and follow-up with vascular as outpatient in a month for wound check if worsening wound at that time may need angiography.  Completed antibiotics 11/23/20. Wound is improving continue wound care MRI did not show any acute osteomyelitis or bony involvement.  Seen by podiatry. Blood cx NGTD. Recent Labs  Lab 12/18/20 1323 12/19/20 0532 12/20/20 0451 12/21/20 0326 12/22/20 0326 12/23/20 0158 12/24/20 0205  WBC 10.1   < > 10.2 11.1* 8.7 8.3 9.3  LATICACIDVEN 1.1  --   --   --   --   --   --    < > = values in this interval not displayed.     Chronic appearing PAD based on ABI with absent flow on tibial arteries: Continue aspirin 81 follow with vascular.  Has statin intolerance.    Acute metabolic encephalopathy: Patient has confused question hallucination.  Her MRI brain showed chronic small vessel ischemic disease, overall mentation much better continue PT OT and supportive care.   Of note she had recent fall during thoracentesis in IR in May-had eeg and mri brain and neuro eval that time. Thought may be it was  a panic attack  Falls/Generalized weakness Hx of fall and back injusry T10 fracture in May 10 Recently hospitalized then to SNF for a month:. Continue PT OT,  awaiting placement.    AKI on CKD stage IV Metabolic acidosis:  Baseline creat 2.19/16 peaked at 3.5, improved better than the baseline.  Encourage oral intake.  Stop IV fluids. Recent Labs    10/17/20 0000 12/03/20 1044 12/07/20 1322 12/18/20 1323 12/19/20 0532 12/20/20 0451 12/21/20 0326 12/22/20 0326 12/23/20 0158 12/24/20 0205  BUN 33* 48* 64* 106* 110* 81*  60* 46* 37* 29*  CREATININE 1.7* 1.64* 2.01* 3.50* 2.93* 2.78* 2.24* 1.91* 1.53* 1.58*    Hyperkalemia: Potassium is stable;off Lokelma.    T2DM stable A1c 5.45/22:stable on sliding scale.  Continue to hold home [place]  Lab 12/23/20 1124 12/23/20 1620 12/23/20 2122 12/24/20 0632 12/24/20 1151  GLUCAP 123* 128* 128* 93 98   Essential hypertension: BP fairly stable on atenolol and clonidine.    HLD-has statin intolerance  History of gout-Allopurinol on hold  Microcytic anemia  Likely from chronic kidney disease.  Hemoglobin stable.   Recent Labs    12/20/20 0451 12/21/20 0326 12/21/20 1140 12/22/20 0326 12/23/20 0158 12/24/20 0205  HGB 9.7* 10.5*  --  9.7* 8.8* 8.9*  MCV 78.7* 79.4*  --  80.4 81.5 80.6  VITAMINB12  --   --  744  --   --   --    Pressure Injury 12/19/20 Vertebral column Mid Deep Tissue Pressure Injury - Purple or maroon localized area of discolored intact skin or blood-filled blister due to damage of underlying soft tissue from pressure and/or shear. (Active)  12/19/20 2254  Location: Vertebral column  Location Orientation: Mid  Staging: Deep Tissue Pressure Injury - Purple or maroon localized area of discolored intact skin or blood-filled blister due to damage of underlying soft tissue from pressure and/or shear.  Wound Description (Comments):   Present on Admission: Yes   Diet Order             Diet renal with fluid restriction Fluid restriction: 1200 mL Fluid; Room service appropriate? Yes; Fluid consistency: Thin  Diet effective now                   DVT prophylaxis: heparin injection 5,000 Units Start: 12/18/20 2200 Code Status:   Code Status: Full Code  Family Communication: plan of care discussed with patient at bedside. Spoke w/ husband 9/29. Updated DIL [name] at bedside  Status is: Inpatient Remains inpatient appropriate because unsafe dispo Dispo: The patient is from: Home              Anticipated d/c is to:SNF               Patient currently is not medically stable to d/c.   Difficult to place patient No  Objective: Vitals: Today's Vitals   12/23/20 1924 12/23/20 2048 12/24/20 0500 12/24/20 1034  BP:  (!) 166/76 (!) 151/72 (!) 149/61  Pulse:  80 96 73  Resp:  18 18 17   Temp:  99.1 F (37.3 C) 98.9 F (37.2 C) 98.2 F (36.8 C)  TempSrc:  Oral Oral Oral  SpO2:   98% 100%  Weight:      Height:      PainSc: 1  0-No pain     Physical Examination:  General exam: AAOx2-3, pleasant, older than stated age, weak appearing. HEENT:Oral mucosa moist, Ear/Nose WNL grossly, dentition normal. Respiratory system: bilaterally clear breath sounds, no use of accessory muscle Cardiovascular system: S1 & S2 +, No JVD,. Gastrointestinal system: Abdomen soft, NT,ND, BS+ Nervous System:Alert, awake, moving extremities and grossly nonfocal  Extremities: Chronic appearing lower leg wound with Unna boot in place  Skin: No rashes,no icterus. MSK: Normal muscle bulk,tone, power   Medications reviewed:  Scheduled Meds:  aspirin EC  81 mg Oral Daily   atenolol  100 mg Oral Daily   cloNIDine  0.2 mg Oral BID   heparin  5,000 Units Subcutaneous Q8H   insulin aspart  0-5 Units Subcutaneous QHS   insulin aspart  0-9 Units Subcutaneous TID WC   liver oil-zinc oxide   Topical TID   senna  1 tablet Oral BID   Continuous Infusions:  sodium chloride Stopped (12/21/20 2036)   sodium chloride 30 mL/hr at 12/23/20 0637    Intake/Output  Intake/Output Summary (Last 24 hours) at 12/24/2020 1323 Last data filed at 12/24/2020 0800 Gross per 24 hour  Intake 1137.85 ml  Output --  Net 1137.85 ml    Intake/Output from previous day: 10/02 0701 - 10/03 0700 In: 1077.9 [P.O.:720; I.V.:357.9] Out: 0  Net IO Since Admission: 5,221.43 mL [12/24/20 1323]   Weight change:   Wt Readings from Last 3 Encounters:  12/20/20 66.6 kg  12/03/20 66.6 kg  10/18/20 68.2 kg     Consultants:see note  Procedures:see  note Antimicrobials: Anti-infectives (From admission, onward)    Start     Dose/Rate Route Frequency Ordered Stop   12/21/20 1045  cephALEXin (KEFLEX) capsule 500 mg        500 mg Oral Every 12 hours 12/21/20 0954 12/23/20 2126   12/20/20 0730  vancomycin (VANCOCIN) IVPB 1000 mg/200 mL premix        1,000 mg 200 mL/hr over 60 Minutes Intravenous  Once 12/20/20 0637 12/20/20 1202   12/20/20 0730  piperacillin-tazobactam (ZOSYN) IVPB 2.25 g  Status:  Discontinued        2.25 g 100 mL/hr over 30 Minutes Intravenous Every 8 hours 12/20/20 0638 12/21/20 0854   12/19/20 1400  piperacillin-tazobactam (ZOSYN) IVPB 3.375 g  Status:  Discontinued        3.375 g 12.5 mL/hr over 240 Minutes Intravenous Every 8 hours 12/19/20 0715 12/19/20 0716   12/19/20 1400  piperacillin-tazobactam (ZOSYN) IVPB 3.375 g  Status:  Discontinued        3.375 g 100 mL/hr over 30 Minutes Intravenous Every 8 hours 12/19/20 0718 12/20/20 0638   12/18/20 2200  piperacillin-tazobactam (ZOSYN) IVPB 2.25 g  Status:  Discontinued        2.25 g 100 mL/hr over 30 Minutes Intravenous Every 8 hours 12/18/20 1842 12/19/20 0715   12/18/20 1555  vancomycin variable dose per unstable renal function (pharmacist dosing)  Status:  Discontinued         Does not apply See admin instructions 12/18/20 1555 12/21/20 0854   12/18/20 1500  vancomycin (VANCOCIN) IVPB 1000 mg/200 mL premix        1,000 mg 200 mL/hr over 60 Minutes Intravenous  Once 12/18/20 1458 12/18/20 1622   12/18/20 1400  piperacillin-tazobactam (ZOSYN) IVPB 3.375 g        3.375 g 100 mL/hr over 30 Minutes Intravenous  Once 12/18/20 1349 12/18/20 1510      Culture/Microbiology    Component Value Date/Time   SDES URINE, CLEAN CATCH 12/21/2020 [place] 12/21/2020 1157   CULT  12/21/2020 1157    NO GROWTH Performed at [facility], [place] [address], [place], [place] [address]    REPTSTATUS 12/22/2020 FINAL 12/21/2020 1157    Other culture-see  note  Unresulted Labs (  From admission, onward)    None       Data Reviewed: I have personally reviewed following labs and imaging studies CBC: Recent Labs  Lab 12/20/20 0451 12/21/20 0326 12/22/20 0326 12/23/20 0158 12/24/20 0205  WBC 10.2 11.1* 8.7 8.3 9.3  HGB 9.7* 10.5* 9.7* 8.8* 8.9*  HCT 30.6* 34.0* 31.9* 29.9* 29.5*  MCV 78.7* 79.4* 80.4 81.5 80.6  PLT 273 290 255 205 300    Basic Metabolic Panel: Recent Labs  Lab 12/20/20 0451 12/21/20 0326 12/22/20 0326 12/23/20 0158 12/24/20 0205  NA 139 141 143 140 137  K 5.0 4.2 4.0 3.8 3.7  CL 110 112* 116* 114* 110  CO2 20* 20* 20* 20* 21*  GLUCOSE 88 114* 88 91 133*  BUN 81* 60* 46* 37* 29*  CREATININE 2.78* 2.24* 1.91* 1.53* 1.58*  CALCIUM 8.9 9.0 8.9 8.8* 8.5*    GFR: Estimated Creatinine Clearance: 29.3 mL/min (A) (by C-G formula based on SCr of 1.58 mg/dL (H)). Liver Function Tests: Recent Labs  Lab 12/18/20 1323  AST 10*  ALT 7  ALKPHOS 99  BILITOT 0.3  PROT 7.7  ALBUMIN 3.5    No results for input(s): LIPASE, AMYLASE in the last 168 hours. Recent Labs  Lab 12/21/20 1140  AMMONIA 15   Coagulation Profile: No results for input(s): INR, PROTIME in the last 168 hours. Cardiac Enzymes: No results for input(s): CKTOTAL, CKMB, CKMBINDEX, TROPONINI in the last 168 hours. BNP (last 3 results) No results for input(s): PROBNP in the last 8760 hours. HbA1C: No results for input(s): HGBA1C in the last 72 hours.  CBG: Recent Labs  Lab 12/23/20 1124 12/23/20 1620 12/23/20 2122 12/24/20 0632 12/24/20 1151  GLUCAP 123* 128* 128* 93 98    Lipid Profile: No results for input(s): CHOL, HDL, LDLCALC, TRIG, CHOLHDL, LDLDIRECT in the last 72 hours. Thyroid Function Tests: No results for input(s): TSH, T4TOTAL, FREET4, T3FREE, THYROIDAB in the last 72 hours. Anemia Panel: No results for input(s): VITAMINB12, FOLATE, FERRITIN, TIBC, IRON, RETICCTPCT in the last 72 hours. Sepsis Labs: Recent Labs   Lab 12/18/20 1323  LATICACIDVEN 1.1     Recent Results (from the past 240 hour(s))  Resp Panel by RT-PCR (Flu A&B, Covid) Nasopharyngeal Swab     Status: None   Collection Time: 12/18/20  1:23 PM   Specimen: Nasopharyngeal Swab; Nasopharyngeal(NP) swabs in vial transport medium  Result Value Ref Range Status   SARS Coronavirus 2 by RT PCR NEGATIVE NEGATIVE Final    Comment: (NOTE) SARS-CoV-2 target nucleic acids are NOT DETECTED.  The SARS-CoV-2 RNA is generally detectable in upper respiratory specimens during the acute phase of infection. The lowest concentration of SARS-CoV-2 viral copies this assay can detect is 138 copies/mL. A negative result does not preclude SARS-Cov-2 infection and should not be used as the sole basis for treatment or other patient management decisions. A negative result may occur with  improper specimen collection/handling, submission of specimen other than nasopharyngeal swab, presence of viral mutation(s) within the areas targeted by this assay, and inadequate number of viral copies(<138 copies/mL). A negative result must be combined with clinical observations, patient history, and epidemiological information. The expected result is Negative.  Fact Sheet for Patients:  EntrepreneurPulse.com.au  Fact Sheet for Healthcare Providers:  IncredibleEmployment.be  This test is no t yet approved or cleared by the [place] FDA and  has been authorized for detection and/or diagnosis of SARS-CoV-2 by FDA under an Emergency Use Authorization (EUA). This EUA will  remain  in effect (meaning this test can be used) for the duration of the COVID-19 declaration under Section 564(b)(1) of the Act, 21 U.S.C.section 360bbb-3(b)(1), unless the authorization is terminated  or revoked sooner.       Influenza A by PCR NEGATIVE NEGATIVE Final   Influenza B by PCR NEGATIVE NEGATIVE Final    Comment: (NOTE) The Xpert Xpress  SARS-CoV-2/FLU/RSV plus assay is intended as an aid in the diagnosis of influenza from Nasopharyngeal swab specimens and should not be used as a sole basis for treatment. Nasal washings and aspirates are unacceptable for Xpert Xpress SARS-CoV-2/FLU/RSV testing.  Fact Sheet for Patients: EntrepreneurPulse.com.au  Fact Sheet for Healthcare Providers: IncredibleEmployment.be  This test is not yet approved or cleared by the [place] FDA and has been authorized for detection and/or diagnosis of SARS-CoV-2 by FDA under an Emergency Use Authorization (EUA). This EUA will remain in effect (meaning this test can be used) for the duration of the COVID-19 declaration under Section 564(b)(1) of the Act, 21 U.S.C. section 360bbb-3(b)(1), unless the authorization is terminated or revoked.  Performed at [facility], [address] [place], [place] [address]   Blood culture (routine x 2)     Status: None   Collection Time: 12/18/20  1:25 PM   Specimen: Right Antecubital; Blood  Result Value Ref Range Status   Specimen Description   Final    RIGHT ANTECUBITAL Performed at [facility], [address], [place], [place] [address]    Special Requests   Final    Blood Culture adequate volume Performed at [facility], [address] [place], [place] [address]    Culture   Final    NO GROWTH 5 DAYS Performed at [facility], [place] [address] [place], [place] [address]    Report Status 12/23/2020 FINAL  Final  Blood culture (routine x 2)     Status: None   Collection Time: 12/18/20  1:44 PM   Specimen: Right Antecubital; Blood  Result Value Ref Range Status   Specimen Description   Final    RIGHT ANTECUBITAL Performed at [facility], [address], [place], [place] [address]    Special Requests   Final    Blood Culture adequate volume Performed at [facility], [address] [place], [place] [address]    Culture   Final    NO GROWTH 5 DAYS Performed at [facility], [place] [address], [place], [place] [address]    Report Status 12/23/2020 FINAL  Final  Urine Culture     Status: None   Collection Time: 12/21/20 11:57 AM   Specimen: Urine, Clean Catch  Result Value Ref Range Status   Specimen Description URINE, CLEAN CATCH  Final   Special Requests NONE  Final   Culture   Final    NO GROWTH Performed at [facility], [place] [address], [place],  [address]    Report Status 12/22/2020 FINAL  Final      Radiology Studies: No results found.   LOS: 6 days   Antonieta Pert, MD [facility]  12/24/2020, 1:23 PM

## 2020-12-24 NOTE — Plan of Care (Signed)

## 2020-12-24 NOTE — Progress Notes (Signed)
Occupational Therapy Treatment Patient Details Name: Jacqueline Richmond MRN: [id number] DOB: [birth year] Today's Date: 12/24/2020   History of present illness 75 y.o. female presents to [facility] hospital on 12/18/2020 from podiatry with worsening R heel wound. PMH includes T2DM, HTN, HLD, CKD stage IV, gout, diverticulosis recently hospitalized 6/9-08/2012 for wound debridement and application of wound VAC on a previously operated T10 fracture site, discharged to SNF for 2 months prior to returning home.   OT comments  Pt received in bed, agreeable to OT session. Pt requires modA for log rolling technique to progress to sitting EOB. While sitting EOB she required minguard for safety due to dependence on LUE support to maintain stability and loss of balance to the left without UE support. Pt again started bleeding from BLE when sitting EOB, RN arrived and aware. Pt then completed grooming while seated in upright position in bed. Pt appears to have great improvement with cognition, she was oriented x4 demonstrated cognition WFL for tasks assessed. Pt will continue to benefit from skilled OT services to maximize safety and independence with ADL/IADL and functional mobility. Will continue to follow acutely and progress as tolerated.     Recommendations for follow up therapy are one component of a multi-disciplinary discharge planning process, led by the attending physician.  Recommendations may be updated based on patient status, additional functional criteria and insurance authorization.    Follow Up Recommendations  SNF (Pt most likely will decline, however husband is unable to provide any physical assist to pt.)    Equipment Recommendations  None recommended by OT    Recommendations for Other Services      Precautions / Restrictions Precautions Precautions: Fall Precaution Comments: bleeding from RLE upon sitting at side of bed       Mobility Bed Mobility Overal bed mobility: Needs Assistance Bed  Mobility: Rolling;Sidelying to Sit;Sit to Sidelying Rolling: Mod assist Sidelying to sit: Mod assist;HOB elevated   Sit to supine: Max assist Sit to sidelying: Mod assist General bed mobility comments: modA for rolling, pt appears to have increased pain in bilateral shoulders with reaching, but pt reports discomfort in back with reaching. Pt requires modA for BLE and trunk progression and management with return sequencing.    Transfers Overall transfer level:  (deferred due to bleeding)               General transfer comment: Unable to stand this session due to BLE bleeding    Balance Overall balance assessment: Needs assistance Sitting-balance support: Single extremity supported;Feet unsupported Sitting balance-Leahy Scale: Poor Sitting balance - Comments: pt with left lateral loss of balance, reliant on LUE support to maintain stability. Postural control: Left lateral lean     Standing balance comment: Deferred due to bleeding                           ADL either performed or assessed with clinical judgement   ADL Overall ADL's : Needs assistance/impaired Eating/Feeding: Set up;Sitting   Grooming: Set up;Sitting;Wash/dry face;Wash/dry hands;Oral care Grooming Details (indicate cue type and reason): sitting upright bed level Upper Body Bathing: Min guard;Sitting   Lower Body Bathing: Moderate assistance;Sitting/lateral leans   Upper Body Dressing : Min guard;Sitting Upper Body Dressing Details (indicate cue type and reason): doffed/donned gown                   General ADL Comments: pt tolerated sitting EOB ~3 min but was limited secondary to  bleeding in BLE, RN aware. Pt able to complete UB dressing with minguard assistance while sitting EOB. Likely needs maxA for LB dressing.     Vision   Vision Assessment?: No apparent visual deficits   Perception     Praxis      Cognition Arousal/Alertness: Awake/alert Behavior During Therapy: Flat  affect Overall Cognitive Status: Within Functional Limits for tasks assessed                                 General Comments: Pt appears to be at baseline cognition this date. Pt was oriented x4, recalled bleeding from BLE during previous OT/PT sessions, following commands consistently, able to count backwards and state months of year in reverse order. Pt aware of deficits. pt is [facility]        Exercises     Shoulder Instructions       General Comments VSS, pt with Unna boots on BLE, noted blood dripping from BLE when sitting EOB, RN aware    Pertinent Vitals/ Pain       Pain Assessment: Faces Faces Pain Scale: Hurts little more Pain Location: unable to report Pain Descriptors / Indicators: Grimacing;Discomfort Pain Intervention(s): Monitored during session  Home Living                                          Prior Functioning/Environment              Frequency  Min 2X/week        Progress Toward Goals  OT Goals(current goals can now be found in the care plan section)  Progress towards OT goals: Not progressing toward goals - comment (limited by bleeding from BLE)  Acute Rehab OT Goals Patient Stated Goal: to improve mobility and reduce falls risk OT Goal Formulation: With patient/family Time For Goal Achievement: 01/05/21 Potential to Achieve Goals: Good ADL Goals Pt Will Perform Grooming: with supervision;standing Pt Will Perform Lower Body Bathing: with min guard assist;sitting/lateral leans;sit to/from stand Pt Will Perform Lower Body Dressing: with min guard assist;sitting/lateral leans;sit to/from stand Pt Will Transfer to Toilet: with min guard assist;ambulating Pt Will Perform Toileting - Clothing Manipulation and hygiene: with min guard assist;sitting/lateral leans;sit to/from stand  Plan Discharge plan remains appropriate    Co-evaluation                 AM-PAC OT "6 Clicks" Daily Activity     Outcome  Measure   Help from another person eating meals?: None Help from another person taking care of personal grooming?: A Little Help from another person toileting, which includes using toliet, bedpan, or urinal?: A Lot Help from another person bathing (including washing, rinsing, drying)?: A Lot Help from another person to put on and taking off regular upper body clothing?: A Little Help from another person to put on and taking off regular lower body clothing?: A Lot 6 Click Score: 16    End of Session    OT Visit Diagnosis: Unsteadiness on feet (R26.81);Other abnormalities of gait and mobility (R26.89);Muscle weakness (generalized) (M62.81)   Activity Tolerance Patient tolerated treatment well;Other (comment) (SEssion limited due to BLE bleeding)   Patient Left in bed;with call bell/phone within reach;with bed alarm set;with family/visitor present   Nurse Communication Other (comment) (BLE bleeding)  Time: 4129-0475 OT Time Calculation (min): 28 min  Charges: OT General Charges $OT Visit: 1 Visit OT Treatments $Self Care/Home Management : 23-37 mins  Helene Kelp OTR/L Acute Rehabilitation Services Office: [place] 12/24/2020, 3:43 PM

## 2020-12-25 DIAGNOSIS — E1151 Type 2 diabetes mellitus with diabetic peripheral angiopathy without gangrene: Secondary | ICD-10-CM | POA: Diagnosis not present

## 2020-12-25 DIAGNOSIS — E875 Hyperkalemia: Secondary | ICD-10-CM | POA: Diagnosis not present

## 2020-12-25 DIAGNOSIS — I872 Venous insufficiency (chronic) (peripheral): Secondary | ICD-10-CM | POA: Diagnosis not present

## 2020-12-25 LAB — GLUCOSE, CAPILLARY
Glucose-Capillary: 114 mg/dL — ABNORMAL HIGH (ref 70–99)
Glucose-Capillary: 118 mg/dL — ABNORMAL HIGH (ref 70–99)
Glucose-Capillary: 123 mg/dL — ABNORMAL HIGH (ref 70–99)
Glucose-Capillary: 115 mg/dL — ABNORMAL HIGH (ref 70–99)

## 2020-12-25 LAB — HEMOGLOBIN AND HEMATOCRIT, BLOOD
HCT: 30.1 % — ABNORMAL LOW (ref 36.0–46.0)
Hemoglobin: 9 g/dL — ABNORMAL LOW (ref 12.0–15.0)

## 2020-12-25 MED ORDER — HEPARIN SODIUM (PORCINE) 5000 UNIT/ML IJ SOLN
5000.0000 [IU] | Freq: Three times a day (TID) | INTRAMUSCULAR | Status: DC
  Administered 2020-12-26 – 2021-01-03 (×26): 5000 [IU] via SUBCUTANEOUS
  Filled 2020-12-25 (×25): qty 1

## 2020-12-25 NOTE — TOC Progression Note (Signed)
Transition of Care [facility]) - Progression Note    Patient Details  Name: Jacqueline Richmond MRN: [id number] Date of Birth: [birth year]  Transition of Care [facility]) CM/SW Contact  [name], LCSW Phone Number: 12/25/2020, 5:56 PM  Clinical Narrative:  CSW talked with patient's daughter-in-law Jacqueline Richmond (4:45 pm) -[phone] (per husband, she handles things for them) regarding SNF placement. Informed Jacqueline Richmond that [facility] is only bed offer and this was discussed. Daughter agreeable to [facility]. She was informed that [facility] does not have any private rooms. Jacqueline Richmond, admissions liaison with [facility] (4:54 pm) regarding patient and they have bed availability. Jacqueline Richmond informed that insurance Jacqueline Kaufmann will be initiated on Wednesday 10/5.     Expected Discharge Plan: [place] Barriers to Discharge: Continued Medical Work up, Ship broker  Expected Discharge Plan and Services Expected Discharge Plan: [place]       Living arrangements for the past 2 months: Single Family Home                                       Social Determinants of Health (SDOH) Interventions  No SDOH interventions requested or needed at this time.  Readmission Risk Interventions No flowsheet data found.

## 2020-12-25 NOTE — Progress Notes (Signed)
PROGRESS NOTE    Jacqueline Richmond  PXT:[id number] DOB: Aug 22, 1945 DOA: 12/18/2020 PCP: Bernerd Limbo, MD   Chief Complaint  Patient presents with   leg wound   Brief Narrative/Hospital Course: 52 old female with history of T2DM, HTN, HLD, CKD stage IV, gout, diverticulosis recently hospitalized 6/9-08/2012 for wound debridement and application of wound VAC on a previously operated T10 fracture site, has been in [place] [facility] to for 2 months and ultimately discharged home few weeks ago. Patient states that while at [facility], she developed wound and ulcer in her right heel.On 9/27, patient presented to podiatry Dr. Pasty Arch office for evaluation of worsening wound on the right heel.  Previously, she was tried on Keflex and doxycycline by her PCP without improvement.   In the podiatrist office, she was noted to have redness around the wound with drainage, full-thickness ulceration of the right heel.  There was a a concern of osteomyelitis of the right heel with possible venous insufficiency.  Patient was directed to ED at [facility].    In the ED, patient was afebrile, hemodynamically stable, labs remarkable for hyperkalemia AKI X-rays of right tibia-fibula and foot were obtained.  It did not show any evidence of osteomyelitis.  Admitted for further work-up and management at [facility] and was transferred to [facility] for vascular evaluation due to abnormal ABI.  Seen by vascular felt to be chronic PVD and venous ulceration advised whenever wound care and outpatient follow-up.  Patient was placed on IV antibiotic briefly and subsequently changed to p.o. and complete the course.  Wound is healing nicely unknown output patient will need skilled nursing for replacement and will need outpatient wound care follow-up Hospital course complicated with acute metabolic encephalopathy  Subjective: Seen and examined.  Patient is much more alert awake oriented mentation much better.  Dressing being changed this afternoon.  She has  had bleeding from the right foot  wound especially with PT.  Assessment & Plan:  Bilateral lower extremity ulceration and posterior heel ulceration with cellulitis  venous ulceration : Ulceration in the setting of PAD based on ABI.  Noncompressible tibial arteries on ABI.  Seen by vascular -felt not a good revascularization candidate, plan is to continue wound care, offloading, Unna boot and follow-up with vascular as outpatient in a month for wound check if worsening wound at that time may need angiography.  Completed antibiotics 11/23/20.  Unna boot dressing changed today -wound care reconsulted, Ortho called to replace Unna boots today.  Per wound care to change Unna boot Tuesday/Friday.MRI did not show any acute osteomyelitis or bony involvement.  Seen by podiatry. Blood cx NGTD. Recent Labs  Lab 12/18/20 1323 12/19/20 0532 12/20/20 0451 12/21/20 0326 12/22/20 0326 12/23/20 0158 12/24/20 0205  WBC 10.1   < > 10.2 11.1* 8.7 8.3 9.3  LATICACIDVEN 1.1  --   --   --   --   --   --    < > = values in this interval not displayed.     Chronic appearing PAD based on ABI with absent flow on tibial arteries: Continue aspirin 81 follow with vascular.  Has statin intolerance.    Acute metabolic encephalopathy: Patient is much more alert awake oriented and interactive.  Continue supportive care PT OT.  MRI brain showed chronic small vessel ischemic disease, overall mentation much better continue PT OT and supportive care.   Of note she had recent fall during thoracentesis in IR in May-had eeg and mri brain and neuro eval that  time. Thought may be it was  a panic attack  Falls/Generalized weakness Hx of fall and back injusry T10 fracture in May 10 Recently hospitalized then to SNF for a month:. Continue PT OT, awaiting snf.  AKI on CKD stage IV Metabolic acidosis:  Baseline creat 2.19/16 peaked at 3.5, improved better than the baseline.  Encourage oral intake.Off IV fluids. Recent Labs     10/17/20 0000 12/03/20 1044 12/07/20 1322 12/18/20 1323 12/19/20 0532 12/20/20 0451 12/21/20 0326 12/22/20 0326 12/23/20 0158 12/24/20 0205  BUN 33* 48* 64* 106* 110* 81* 60* 46* 37* 29*  CREATININE 1.7* 1.64* 2.01* 3.50* 2.93* 2.78* 2.24* 1.91* 1.53* 1.58*    Hyperkalemia: Potassium is stable;off Lokelma.   Recent Labs  Lab 12/20/20 0451 12/21/20 0326 12/22/20 0326 12/23/20 0158 12/24/20 0205  K 5.0 4.2 4.0 3.8 3.7     T2DM stable A1c 5.45/22: controlled. continue to hold home [place]  Lab 12/24/20 1151 12/24/20 1632 12/24/20 2047 12/25/20 0729 12/25/20 1126  GLUCAP 98 82 123* 114* 118*   Essential hypertension: BP fairly stable on atenolol and clonidine.    HLD-has statin intolerance  History of gout-Allopurinol on hold-resume on d/c.  Microcytic anemia  Likely from chronic kidney disease.  Hemoglobin stable.   Recent Labs    12/21/20 0326 12/21/20 1140 12/22/20 0326 12/23/20 0158 12/24/20 0205 12/25/20 0816  HGB 10.5*  --  9.7* 8.8* 8.9* 9.0*  MCV 79.4*  --  80.4 81.5 80.6  --   VITAMINB12  --  744  --   --   --   --    Pressure Injury 12/19/20 Vertebral column Mid Deep Tissue Pressure Injury - Purple or maroon localized area of discolored intact skin or blood-filled blister due to damage of underlying soft tissue from pressure and/or shear. (Active)  12/19/20 2254  Location: Vertebral column  Location Orientation: Mid  Staging: Deep Tissue Pressure Injury - Purple or maroon localized area of discolored intact skin or blood-filled blister due to damage of underlying soft tissue from pressure and/or shear.  Wound Description (Comments):   Present on Admission: Yes   Diet Order             Diet renal with fluid restriction Fluid restriction: 1200 mL Fluid; Room service appropriate? Yes; Fluid consistency: Thin  Diet effective now                   DVT prophylaxis: heparin injection 5,000 Units Start: 12/26/20 0600 Code Status:    Code Status: Full Code  Family Communication: plan of care discussed with patient at bedside. Spoke w/ husband 9/29. Updated DIL [name] at bedside  Status is: Inpatient. Remains inpatient appropriate because unsafe dispo. Dispo: The patient is from: Home.              Anticipated d/c is to:SNF.              Patient currently is stable for discharge.   Difficult to place patient No Objective: Vitals: Today's Vitals   12/24/20 2045 12/25/20 0546 12/25/20 0554 12/25/20 0848  BP: (!) 152/68  (!) 157/65 (!) 145/56  Pulse: 78  75 70  Resp: 18  16 18   Temp: 98.3 F (36.8 C)  98.2 F (36.8 C) 98 F (36.7 C)  TempSrc: Oral  Oral   SpO2: 99%  98% 97%  Weight:      Height:      PainSc: 0-No pain 9      Physical  Examination:  General exam: AAOx 3older than stated age, weak appearing. HEENT:Oral mucosa moist, Ear/Nose WNL grossly, dentition normal. Respiratory system: bilaterally diminished, , no use of accessory muscle Cardiovascular system: S1 & S2 +, No JVD,. Gastrointestinal system: Abdomen soft, NT,ND, BS+ Nervous System:Alert, awake, moving extremities and grossly nonfocal Extremities: Chronic appearing leg wounds with areas of raw skin, ulceration and bleeding. Skin: No rashes,no icterus. MSK: small muscle bulk,tone, power     Previous lle.   Pic on 9/29   Pic 1-422   Medications reviewed:  Scheduled Meds:  aspirin EC  81 mg Oral Daily   atenolol  100 mg Oral Daily   cloNIDine  0.2 mg Oral BID   [START ON 12/26/2020] heparin  5,000 Units Subcutaneous Q8H   insulin aspart  0-5 Units Subcutaneous QHS   insulin aspart  0-9 Units Subcutaneous TID WC   liver oil-zinc oxide   Topical TID   senna  1 tablet Oral BID   Continuous Infusions:  sodium chloride Stopped (12/21/20 2036)    Intake/Output  Intake/Output Summary (Last 24 hours) at 12/25/2020 1137 Last data filed at 12/24/2020 1700 Gross per 24 hour  Intake 800.32 ml  Output --  Net 800.32 ml     Intake/Output from previous day: 10/03 0701 - 10/04 0700 In: 1160 [P.O.:900; I.V.:260] Out: -  Net IO Since Admission: 6,081.45 mL [12/25/20 1137]   Weight change:   Wt Readings from Last 3 Encounters:  12/20/20 66.6 kg  12/03/20 66.6 kg  10/18/20 68.2 kg     Consultants:see note  Procedures:see note Antimicrobials: Anti-infectives (From admission, onward)    Start     Dose/Rate Route Frequency Ordered Stop   12/21/20 1045  cephALEXin (KEFLEX) capsule 500 mg        500 mg Oral Every 12 hours 12/21/20 0954 12/23/20 2126   12/20/20 0730  vancomycin (VANCOCIN) IVPB 1000 mg/200 mL premix        1,000 mg 200 mL/hr over 60 Minutes Intravenous  Once 12/20/20 0637 12/20/20 1202   12/20/20 0730  piperacillin-tazobactam (ZOSYN) IVPB 2.25 g  Status:  Discontinued        2.25 g 100 mL/hr over 30 Minutes Intravenous Every 8 hours 12/20/20 0638 12/21/20 0854   12/19/20 1400  piperacillin-tazobactam (ZOSYN) IVPB 3.375 g  Status:  Discontinued        3.375 g 12.5 mL/hr over 240 Minutes Intravenous Every 8 hours 12/19/20 0715 12/19/20 0716   12/19/20 1400  piperacillin-tazobactam (ZOSYN) IVPB 3.375 g  Status:  Discontinued        3.375 g 100 mL/hr over 30 Minutes Intravenous Every 8 hours 12/19/20 0718 12/20/20 0638   12/18/20 2200  piperacillin-tazobactam (ZOSYN) IVPB 2.25 g  Status:  Discontinued        2.25 g 100 mL/hr over 30 Minutes Intravenous Every 8 hours 12/18/20 1842 12/19/20 0715   12/18/20 1555  vancomycin variable dose per unstable renal function (pharmacist dosing)  Status:  Discontinued         Does not apply See admin instructions 12/18/20 1555 12/21/20 0854   12/18/20 1500  vancomycin (VANCOCIN) IVPB 1000 mg/200 mL premix        1,000 mg 200 mL/hr over 60 Minutes Intravenous  Once 12/18/20 1458 12/18/20 1622   12/18/20 1400  piperacillin-tazobactam (ZOSYN) IVPB 3.375 g        3.375 g 100 mL/hr over 30 Minutes Intravenous  Once 12/18/20 1349 12/18/20 1510       Culture/Microbiology  Component Value Date/Time   SDES URINE, CLEAN CATCH 12/21/2020 1157   SPECREQUEST NONE 12/21/2020 1157   CULT  12/21/2020 1157    NO GROWTH Performed at [facility], [place] [address], [place], [place] [address]    REPTSTATUS 12/22/2020 FINAL 12/21/2020 1157    Other culture-see note  Unresulted Labs (From admission, onward)    None       Data Reviewed: I have personally reviewed following labs and imaging studies CBC: Recent Labs  Lab 12/20/20 0451 12/21/20 0326 12/22/20 0326 12/23/20 0158 12/24/20 0205 12/25/20 0816  WBC 10.2 11.1* 8.7 8.3 9.3  --   HGB 9.7* 10.5* 9.7* 8.8* 8.9* 9.0*  HCT 30.6* 34.0* 31.9* 29.9* 29.5* 30.1*  MCV 78.7* 79.4* 80.4 81.5 80.6  --   PLT 273 290 255 205 186  --     Basic Metabolic Panel: Recent Labs  Lab 12/20/20 0451 12/21/20 0326 12/22/20 0326 12/23/20 0158 12/24/20 0205  NA 139 141 143 140 137  K 5.0 4.2 4.0 3.8 3.7  CL 110 112* 116* 114* 110  CO2 20* 20* 20* 20* 21*  GLUCOSE 88 114* 88 91 133*  BUN 81* 60* 46* 37* 29*  CREATININE 2.78* 2.24* 1.91* 1.53* 1.58*  CALCIUM 8.9 9.0 8.9 8.8* 8.5*    GFR: Estimated Creatinine Clearance: 29.3 mL/min (A) (by C-G formula based on SCr of 1.58 mg/dL (H)). Liver Function Tests: Recent Labs  Lab 12/18/20 1323  AST 10*  ALT 7  ALKPHOS 99  BILITOT 0.3  PROT 7.7  ALBUMIN 3.5    No results for input(s): LIPASE, AMYLASE in the last 168 hours. Recent Labs  Lab 12/21/20 1140  AMMONIA 15    Coagulation Profile: No results for input(s): INR, PROTIME in the last 168 hours. Cardiac Enzymes: No results for input(s): CKTOTAL, CKMB, CKMBINDEX, TROPONINI in the last 168 hours. BNP (last 3 results) No results for input(s): PROBNP in the last 8760 hours. HbA1C: No results for input(s): HGBA1C in the last 72 hours.  CBG: Recent Labs  Lab 12/24/20 1151 12/24/20 1632 12/24/20 2047 12/25/20 0729 12/25/20 1126  GLUCAP 98 82 123* 114* 118*     Lipid Profile: No results for input(s): CHOL, HDL, LDLCALC, TRIG, CHOLHDL, LDLDIRECT in the last 72 hours. Thyroid Function Tests: No results for input(s): TSH, T4TOTAL, FREET4, T3FREE, THYROIDAB in the last 72 hours. Anemia Panel: No results for input(s): VITAMINB12, FOLATE, FERRITIN, TIBC, IRON, RETICCTPCT in the last 72 hours. Sepsis Labs: Recent Labs  Lab 12/18/20 1323  LATICACIDVEN 1.1     Recent Results (from the past 240 hour(s))  Resp Panel by RT-PCR (Flu A&B, Covid) Nasopharyngeal Swab     Status: None   Collection Time: 12/18/20  1:23 PM   Specimen: Nasopharyngeal Swab; Nasopharyngeal(NP) swabs in vial transport medium  Result Value Ref Range Status   SARS Coronavirus 2 by RT PCR NEGATIVE NEGATIVE Final    Comment: (NOTE) SARS-CoV-2 target nucleic acids are NOT DETECTED.  The SARS-CoV-2 RNA is generally detectable in upper respiratory specimens during the acute phase of infection. The lowest concentration of SARS-CoV-2 viral copies this assay can detect is 138 copies/mL. A negative result does not preclude SARS-Cov-2 infection and should not be used as the sole basis for treatment or other patient management decisions. A negative result may occur with  improper specimen collection/handling, submission of specimen other than nasopharyngeal swab, presence of viral mutation(s) within the areas targeted by this assay, and inadequate number of viral copies(<138  copies/mL). A negative result must be combined with clinical observations, patient history, and epidemiological information. The expected result is Negative.  Fact Sheet for Patients:  EntrepreneurPulse.com.au  Fact Sheet for Healthcare Providers:  IncredibleEmployment.be  This test is no t yet approved or cleared by the [place] FDA and  has been authorized for detection and/or diagnosis of SARS-CoV-2 by FDA under an Emergency Use Authorization (EUA). This EUA will  remain  in effect (meaning this test can be used) for the duration of the COVID-19 declaration under Section 564(b)(1) of the Act, 21 U.S.C.section 360bbb-3(b)(1), unless the authorization is terminated  or revoked sooner.       Influenza A by PCR NEGATIVE NEGATIVE Final   Influenza B by PCR NEGATIVE NEGATIVE Final    Comment: (NOTE) The Xpert Xpress SARS-CoV-2/FLU/RSV plus assay is intended as an aid in the diagnosis of influenza from Nasopharyngeal swab specimens and should not be used as a sole basis for treatment. Nasal washings and aspirates are unacceptable for Xpert Xpress SARS-CoV-2/FLU/RSV testing.  Fact Sheet for Patients: EntrepreneurPulse.com.au  Fact Sheet for Healthcare Providers: IncredibleEmployment.be  This test is not yet approved or cleared by the [place] FDA and has been authorized for detection and/or diagnosis of SARS-CoV-2 by FDA under an Emergency Use Authorization (EUA). This EUA will remain in effect (meaning this test can be used) for the duration of the COVID-19 declaration under Section 564(b)(1) of the Act, 21 U.S.C. section 360bbb-3(b)(1), unless the authorization is terminated or revoked.  Performed at [facility], [address], [place], [place] [address]   Blood culture (routine x 2)     Status: None   Collection Time: 12/18/20  1:25 PM   Specimen: Right Antecubital; Blood  Result Value Ref Range Status   Specimen Description   Final    RIGHT ANTECUBITAL Performed at [facility], [address] [place], [place] [address]    Special Requests   Final    Blood Culture adequate volume Performed at [facility], [address] [place], [place] [address]    Culture   Final    NO GROWTH 5 DAYS Performed at [facility], [place] [address], [place], [place] [address]    Report Status 12/23/2020 FINAL  Final  Blood culture (routine x  2)     Status: None   Collection Time: 12/18/20  1:44 PM   Specimen: Right Antecubital; Blood  Result Value Ref Range Status   Specimen Description   Final    RIGHT ANTECUBITAL Performed at [facility], [address], [place], [place] [address]    Special Requests   Final    Blood Culture adequate volume Performed at [facility], [address] [place], [place] [address]    Culture   Final    NO GROWTH 5 DAYS Performed at [facility], [place] [address], [place], [place] [address]    Report Status 12/23/2020 FINAL  Final  Urine Culture     Status: None   Collection Time: 12/21/20 11:57 AM   Specimen: Urine, Clean Catch  Result Value Ref Range Status   Specimen Description URINE, CLEAN CATCH  Final   Special Requests NONE  Final   Culture   Final    NO GROWTH Performed at [facility], [place] [address], [place],  [address]    Report Status 12/22/2020 FINAL  Final      Radiology Studies: No results found.  LOS: 7 days   Antonieta Pert, MD [facility]  12/25/2020, 11:37 AM

## 2020-12-25 NOTE — Progress Notes (Signed)
Physical Therapy Treatment Patient Details Name: Jacqueline Richmond MRN: [id number] DOB: 06-26-1945 Today's Date: 12/25/2020   History of Present Illness 75 y.o. female presents to [facility] hospital on 12/18/2020 from podiatry with worsening R heel wound. PMH includes T2DM, HTN, HLD, CKD stage IV, gout, diverticulosis recently hospitalized 6/9-08/2012 for wound debridement and application of wound VAC on a previously operated T10 fracture site, discharged to SNF for 2 months prior to returning home.    PT Comments    Pt much more alert, interactive, and participatory this session compared to initial eval. Pt engaging in conversation, following commands and participated in exercises. Pt OOB mobility continues to be limited by bleeding from bilat feet (RN present and aware) when feet in dependent position at EOB. Pt did attempt to stand but had significant pain and was unable to place any weight on R foot. Unsure how much to progress OOB Mobility due to constant bleeding from bilat LEs when LEs in dependent position. Acute PT to cont to follow.    Recommendations for follow up therapy are one component of a multi-disciplinary discharge planning process, led by the attending physician.  Recommendations may be updated based on patient status, additional functional criteria and insurance authorization.  Follow Up Recommendations  SNF     Equipment Recommendations   (defer to post-acute)    Recommendations for Other Services       Precautions / Restrictions Precautions Precautions: Fall Precaution Comments: bleeding from bilat heels upon sitting EOB, R calf, R anterior 4th and 5th toe, L lateral calf Restrictions Weight Bearing Restrictions: No     Mobility  Bed Mobility Overal bed mobility: Needs Assistance Bed Mobility: Rolling;Sidelying to Sit;Sit to Supine Rolling: Mod assist Sidelying to sit: Mod assist;HOB elevated   Sit to supine: Mod assist;+2 for physical assistance   General bed  mobility comments: tactile cues to reach with R UE across self for bed rail to initiate rolling, modA to achieve sidleying, pt wiht report of back pain, modA for trunk elevation with HOB at 30 deg, incresaed time, modA to bring LEs back into the bed    Transfers Overall transfer level: Needs assistance (deferred due to bleeding) Equipment used: 2 person hand held assist Transfers: Sit to/from Stand Sit to Stand: Mod assist;+2 physical assistance         General transfer comment: pt stood briefly to changed linens on bed, pt unable to place weight on R LE  due to pain, tolerated approx 30 sec,  Ambulation/Gait             General Gait Details: unable due to R LE pain and active bleeding from bilat LEs from wounds despite dressings   Stairs             Wheelchair Mobility    Modified Rankin (Stroke Patients Only)       Balance Overall balance assessment: Needs assistance Sitting-balance support: Bilateral upper extremity supported;Feet supported Sitting balance-Leahy Scale: Fair Sitting balance - Comments: pt required no physical assist today to maintain EOB balance. Pt tolerated sitting EOB x 10 min while RN and PT reinforced dressings to bilat LEs, pt also able complete LE ther ex without LOB   Standing balance support: Bilateral upper extremity supported Standing balance-Leahy Scale: Zero Standing balance comment: dependent on external support                            Cognition Arousal/Alertness: Awake/alert Behavior During  Therapy: WFL for tasks assessed/performed Overall Cognitive Status: No family/caregiver present to determine baseline cognitive functioning Area of Impairment: Attention;Memory;Safety/judgement;Awareness;Problem solving                 Orientation Level: Disoriented to;Time;Situation (pt able to state hospital and date, unsure why she is in the hospital, re-oriented to situation) Current Attention Level: Selective    Following Commands: Follows one step commands with increased time Safety/Judgement: Decreased awareness of deficits Awareness: Emergent Problem Solving: Slow processing;Requires verbal cues;Requires tactile cues General Comments: pt more alert and participatory today, pt able to follow commands and initiated mobility and desired to get OOB however limited by bleeding from bilat LEs      Exercises General Exercises - Lower Extremity Ankle Circles/Pumps: AROM;Both;10 reps;Supine Long Arc Quad: AROM;Both;10 reps;Seated Hip Flexion/Marching: AROM;Both;10 reps;Seated    General Comments General comments (skin integrity, edema, etc.): pt with bilat Unna boots on however is continuing to bleed through dressings in multiple areas of bilat LEs      Pertinent Vitals/Pain Pain Assessment: Faces Faces Pain Scale: Hurts even more Pain Location: R foot Pain Descriptors / Indicators: Throbbing;Discomfort;Grimacing (worsened when LEs in dependent position) Pain Intervention(s): Monitored during session (RN present and gave pt tylenol)    Home Living                      Prior Function            PT Goals (current goals can now be found in the care plan section) Acute Rehab PT Goals PT Goal Formulation: With family Time For Goal Achievement: 01/04/21 Potential to Achieve Goals: Fair Progress towards PT goals: Progressing toward goals    Frequency    Min 2X/week      PT Plan Current plan remains appropriate    Co-evaluation              AM-PAC PT "6 Clicks" Mobility   Outcome Measure  Help needed turning from your back to your side while in a flat bed without using bedrails?: A Lot Help needed moving from lying on your back to sitting on the side of a flat bed without using bedrails?: A Lot Help needed moving to and from a bed to a chair (including a wheelchair)?: A Lot Help needed standing up from a chair using your arms (e.g., wheelchair or bedside chair)?: A  Lot Help needed to walk in hospital room?: Total Help needed climbing 3-5 steps with a railing? : Total 6 Click Score: 10    End of Session Equipment Utilized During Treatment: Gait belt Activity Tolerance: Treatment limited secondary to medical complications (Comment) (bleeding) Patient left: in bed;with call bell/phone within reach;with bed alarm set Nurse Communication: Mobility status (RN present, aware of bleeding and addressed it) PT Visit Diagnosis: Other abnormalities of gait and mobility (R26.89);Muscle weakness (generalized) (M62.81);Pain     Time: 1001-1025 PT Time Calculation (min) (ACUTE ONLY): 24 min  Charges:  $Therapeutic Exercise: 8-22 mins $Therapeutic Activity: 8-22 mins                     Kittie Plater, PT, DPT Acute Rehabilitation Services Pager #: [phone] Office #: [phone]    Berline Lopes 12/25/2020, 10:43 AM

## 2020-12-25 NOTE — Consult Note (Signed)
[place] Nurse wound follow up Wound consult re-evaluation of BLE. New photos taken today by Dr. Antonieta Pert. Bedside RN, Candice has already removed the Unna boots (which were not removed yesterday as ordered), and performed the appropriate wound care. Ortho has been called to come and replace Unna boots. Will change order from M/Th to Tu/Fr.      Monitor the wound area(s) for worsening of condition such as: Signs/symptoms of infection, increase in size, development of or worsening of odor, development of pain, or increased pain at the affected locations.   Notify the medical team if any of these develop.  Thank you for the consult. [place] nurse will not follow at this time.   Please re-consult the [facility] team if needed.  Cathlean Marseilles Tamala Julian, MSN, RN, [place], Lysle Pearl, [facility] Wound Treatment Associate Pager [phone]

## 2020-12-25 NOTE — Progress Notes (Signed)
Orthopedic Tech Progress Note Patient Details:  Jacqueline Richmond [birth year] [id number]  Ortho Devices Type of Ortho Device: Louretta Parma boot Ortho Device/Splint Location: BLE Ortho Device/Splint Interventions: Ordered, Application   Post Interventions Patient Tolerated: Well Instructions Provided: Care of device  Janit Pagan 12/25/2020, 1:11 PM

## 2020-12-25 NOTE — Plan of Care (Signed)

## 2020-12-26 DIAGNOSIS — S81801D Unspecified open wound, right lower leg, subsequent encounter: Secondary | ICD-10-CM | POA: Diagnosis not present

## 2020-12-26 DIAGNOSIS — I872 Venous insufficiency (chronic) (peripheral): Secondary | ICD-10-CM | POA: Diagnosis not present

## 2020-12-26 DIAGNOSIS — E875 Hyperkalemia: Secondary | ICD-10-CM | POA: Diagnosis not present

## 2020-12-26 DIAGNOSIS — E1151 Type 2 diabetes mellitus with diabetic peripheral angiopathy without gangrene: Secondary | ICD-10-CM | POA: Diagnosis not present

## 2020-12-26 LAB — GLUCOSE, CAPILLARY
Glucose-Capillary: 101 mg/dL — ABNORMAL HIGH (ref 70–99)
Glucose-Capillary: 128 mg/dL — ABNORMAL HIGH (ref 70–99)
Glucose-Capillary: 211 mg/dL — ABNORMAL HIGH (ref 70–99)
Glucose-Capillary: 134 mg/dL — ABNORMAL HIGH (ref 70–99)

## 2020-12-26 MED ORDER — GUAIFENESIN 100 MG/5ML PO SOLN
5.0000 mL | ORAL | Status: DC | PRN
  Administered 2020-12-26: 100 mg via ORAL
  Filled 2020-12-26: qty 5

## 2020-12-26 NOTE — Progress Notes (Addendum)
PROGRESS NOTE    Jacqueline Richmond  BJS:[id number] DOB: 1945-09-12 DOA: 12/18/2020 PCP: Bernerd Limbo, MD   Chief Complaint  Patient presents with   leg wound    Brief Narrative:  75 old female with history of T2DM, HTN, HLD, CKD stage IV, gout, diverticulosis recently hospitalized 6/9-08/2012 for wound debridement and application of wound VAC on Meria Crilly previously operated T10 fracture site, has been in [facility] to for 2 months and ultimately discharged home few weeks prior to admission.  Patient states that while at [facility], she developed wound and ulcer in her right heel.On 9/27, patient presented to podiatry Dr. Pasty Arch office for evaluation of worsening wound on the right heel.  Previously, she was tried on Keflex and doxycycline by her PCP without improvement.   In the podiatrist office, she was noted to have redness around the wound with drainage, full-thickness ulceration of the right heel.  There was Uva Runkel Lexani Corona concern of osteomyelitis of the right heel with possible venous insufficiency.  Patient was directed to ED at [facility].    In the ED, patient was afebrile, hemodynamically stable, labs remarkable for hyperkalemia AKI.  X-rays of right tibia-fibula and foot were obtained.  It did not show any evidence of osteomyelitis.  Admitted for further work-up and management at [facility] and was transferred to [facility] for vascular evaluation due to abnormal ABI.    Seen by vascular felt to be chronic PVD and venous ulceration.  Recommended bilateral unna boots and offloading, not thought to be Kasarah Sitts good revascularization candidate.  Follow up with VVS in 1 month for wound check, if wound deteriorating, will consider angiography.    Patient was placed on IV antibiotic briefly and subsequently changed to p.o. and complete the course.  Will need skilled nursing for placement and will need outpatient wound care follow-up.  Hospital course complicated with acute metabolic encephalopathy   Assessment & Plan:    Active Problems:   Leg wound, right   Diabetic foot (HCC)   Cellulitis of right lower leg   Venous insufficiency  Bilateral lower extremity ulceration and posterior heel ulceration with cellulitis  venous ulceration : ABI with noncompressible RLE artery - right TBI abnormal (absence of flow).  Left ABI with noncompressible LLE arteries, abnormal L TBI (severe small vessel disease).   Appreciate vascular and podiatry assistance Per vascular, venous disease mixed with PAD - wounds appear venous with right heel pressure ulcer Recommending bilateral UNNA boots, offloading.  Not Daemon Dowty good revascularization candidate.  Plan for 1 month follow up, consider angiography if wounds deteriorating. Wound recs per vascular and podiatry Currently getting unna boots twice weekly, silver alginate to heel wound on right and bilateral unna boot applications MRI heel without osteo - mild tendinosis of distal achilles tendon, partial thickness cartilage loss of tibiotalar joint, mild OA of talonavicular joint, OA of naviculaar medial cuneiform joint S/p abx 9/27 - 10/2  Chronic appearing PAD based on ABI with absent flow on tibial arteries: Continue aspirin 81 follow with vascular.  Has statin intolerance.     Acute metabolic encephalopathy: Patient is much more alert awake oriented and interactive.  Continue supportive care PT OT.  MRI brain showed chronic small vessel ischemic disease, overall mentation much better continue PT OT and supportive care.   Of note she had recent fall during thoracentesis in IR in May-had eeg and mri brain and neuro eval that time. Thought may be it was  [place] panic attack   Falls/Generalized weakness Hx of  fall and back injusry T10 fracture in May 10 Recently hospitalized then to SNF for Advaith Lamarque month:. Continue PT OT, awaiting snf.   AKI on CKD stage IV Metabolic acidosis:  Baseline creat 2.19/16 peaked at 3.5, improved better than the baseline.  Encourage oral intake.Off IV  fluids.  Hyperkalemia: Potassium is stable;off Lokelma.    T2DM stable A1c 5.45/22: controlled. continue to hold home Januvia  Essential hypertension: BP fairly stable on atenolol and clonidine.     HLD-has statin intolerance   History of gout-Allopurinol on hold-resume on d/c.   Microcytic anemia  Likely from chronic kidney disease.  Hemoglobin stable.   Pressure Injury 09/02/20 Heel Right Deep Tissue Pressure Injury - Purple or maroon localized area of discolored intact skin or blood-filled blister due to damage of underlying soft tissue from pressure and/or shear. DTI Right Heel (Active)  09/02/20 2230  Location: Heel  Location Orientation: Right  Staging: Deep Tissue Pressure Injury - Purple or maroon localized area of discolored intact skin or blood-filled blister due to damage of underlying soft tissue from pressure and/or shear.  Wound Description (Comments): DTI Right Heel  Present on Admission: -- (Per patient, she has had for Chaden Doom while)     Pressure Injury 12/19/20 Vertebral column Mid Deep Tissue Pressure Injury - Purple or maroon localized area of discolored intact skin or blood-filled blister due to damage of underlying soft tissue from pressure and/or shear. (Active)  12/19/20 2254  Location: Vertebral column  Location Orientation: Mid  Staging: Deep Tissue Pressure Injury - Purple or maroon localized area of discolored intact skin or blood-filled blister due to damage of underlying soft tissue from pressure and/or shear.  Wound Description (Comments):   Present on Admission: Yes   DVT prophylaxis: heparin Code Status: full  Family Communication: none, husband did not answer - called back Disposition:   Status is: Inpatient  Remains inpatient appropriate because:Inpatient level of care appropriate due to severity of illness  Dispo: The patient is from: Home              Anticipated d/c is to: SNF              Patient currently is not medically stable to d/c.    Difficult to place patient No       Consultants:  Vascular Podiatry  Procedures:  ABI Summary:  Right: Resting right ankle-brachial index indicates noncompressible right  lower extremity arteries and monophasic waveforms. The right toe-brachial  index is abnormal (absence of flow).   Left: Resting left ankle-brachial index indicates noncompressible left  lower extremity arteries and monophasic waveforms. The left toe-brachial  index is abnormal (severe small vessel disease).   Antimicrobials:  Anti-infectives (From admission, onward)    Start     Dose/Rate Route Frequency Ordered Stop   12/21/20 1045  cephALEXin (KEFLEX) capsule 500 mg        500 mg Oral Every 12 hours 12/21/20 0954 12/23/20 2126   12/20/20 0730  vancomycin (VANCOCIN) IVPB 1000 mg/200 mL premix        1,000 mg 200 mL/hr over 60 Minutes Intravenous  Once 12/20/20 0637 12/20/20 1202   12/20/20 0730  piperacillin-tazobactam (ZOSYN) IVPB 2.25 g  Status:  Discontinued        2.25 g 100 mL/hr over 30 Minutes Intravenous Every 8 hours 12/20/20 0638 12/21/20 0854   12/19/20 1400  piperacillin-tazobactam (ZOSYN) IVPB 3.375 g  Status:  Discontinued        3.375 g 12.5  mL/hr over 240 Minutes Intravenous Every 8 hours 12/19/20 0715 12/19/20 0716   12/19/20 1400  piperacillin-tazobactam (ZOSYN) IVPB 3.375 g  Status:  Discontinued        3.375 g 100 mL/hr over 30 Minutes Intravenous Every 8 hours 12/19/20 0718 12/20/20 0638   12/18/20 2200  piperacillin-tazobactam (ZOSYN) IVPB 2.25 g  Status:  Discontinued        2.25 g 100 mL/hr over 30 Minutes Intravenous Every 8 hours 12/18/20 1842 12/19/20 0715   12/18/20 1555  vancomycin variable dose per unstable renal function (pharmacist dosing)  Status:  Discontinued         Does not apply See admin instructions 12/18/20 1555 12/21/20 0854   12/18/20 1500  vancomycin (VANCOCIN) IVPB 1000 mg/200 mL premix        1,000 mg 200 mL/hr over 60 Minutes Intravenous  Once 12/18/20  1458 12/18/20 1622   12/18/20 1400  piperacillin-tazobactam (ZOSYN) IVPB 3.375 g        3.375 g 100 mL/hr over 30 Minutes Intravenous  Once 12/18/20 1349 12/18/20 1510          Subjective: No complaints this afternoon   Objective: Vitals:   12/25/20 1613 12/25/20 2058 12/26/20 0450 12/26/20 0912  BP: 136/61 (!) 168/65 (!) 149/64 (!) 159/58  Pulse: 66 77 72 73  Resp: 18 18 18 18   Temp: 98.4 F (36.9 C) 98.7 F (37.1 C) 98.3 F (36.8 C) 98.1 F (36.7 C)  TempSrc:      SpO2: 99% 100% 98% 100%  Weight:  66.6 kg    Height:        Intake/Output Summary (Last 24 hours) at 12/26/2020 1947 Last data filed at 12/26/2020 1700 Gross per 24 hour  Intake 720 ml  Output 0 ml  Net 720 ml   Filed Weights   12/18/20 1239 12/20/20 2116 12/25/20 2058  Weight: 66.6 kg 66.6 kg 66.6 kg    Examination:  General: No acute distress. Cardiovascular: RRR Lungs:  unlabored Abdomen: Soft, nontender, nondistended  Neurological: Alert and oriented 3. Moves all extremities 4 . Cranial nerves II through XII grossly intact. Skin: Warm and dry. No rashes or lesions. Extremities: legs in prafo boots, bilateral LE unna boots   Data Reviewed: I have personally reviewed following labs and imaging studies  CBC: Recent Labs  Lab 12/20/20 0451 12/21/20 0326 12/22/20 0326 12/23/20 0158 12/24/20 0205 12/25/20 0816  WBC 10.2 11.1* 8.7 8.3 9.3  --   HGB 9.7* 10.5* 9.7* 8.8* 8.9* 9.0*  HCT 30.6* 34.0* 31.9* 29.9* 29.5* 30.1*  MCV 78.7* 79.4* 80.4 81.5 80.6  --   PLT 273 290 255 205 186  --     Basic Metabolic Panel: Recent Labs  Lab 12/20/20 0451 12/21/20 0326 12/22/20 0326 12/23/20 0158 12/24/20 0205  NA 139 141 143 140 137  K 5.0 4.2 4.0 3.8 3.7  CL 110 112* 116* 114* 110  CO2 20* 20* 20* 20* 21*  GLUCOSE 88 114* 88 91 133*  BUN 81* 60* 46* 37* 29*  CREATININE 2.78* 2.24* 1.91* 1.53* 1.58*  CALCIUM 8.9 9.0 8.9 8.8* 8.5*    GFR: Estimated Creatinine Clearance: 29.3 mL/min  (Jin Shockley) (by C-G formula based on SCr of 1.58 mg/dL (H)).  Liver Function Tests: No results for input(s): AST, ALT, ALKPHOS, BILITOT, PROT, ALBUMIN in the last 168 hours.  CBG: Recent Labs  Lab 12/25/20 1613 12/25/20 2056 12/26/20 0647 12/26/20 1120 12/26/20 1617  GLUCAP 123* 115* 101* 128* 211*  Recent Results (from the past 240 hour(s))  Resp Panel by RT-PCR (Flu Kamarie Palma&B, Covid) Nasopharyngeal Swab     Status: None   Collection Time: 12/18/20  1:23 PM   Specimen: Nasopharyngeal Swab; Nasopharyngeal(NP) swabs in vial transport medium  Result Value Ref Range Status   SARS Coronavirus 2 by RT PCR NEGATIVE NEGATIVE Final    Comment: (NOTE) SARS-CoV-2 target nucleic acids are NOT DETECTED.  The SARS-CoV-2 RNA is generally detectable in upper respiratory specimens during the acute phase of infection. The lowest concentration of SARS-CoV-2 viral copies this assay can detect is 138 copies/mL. Nikyah Lackman negative result does not preclude SARS-Cov-2 infection and should not be used as the sole basis for treatment or other patient management decisions. Iniya Matzek negative result may occur with  improper specimen collection/handling, submission of specimen other than nasopharyngeal swab, presence of viral mutation(s) within the areas targeted by this assay, and inadequate number of viral copies(<138 copies/mL). Wynston Romey negative result must be combined with clinical observations, patient history, and epidemiological information. The expected result is Negative.  Fact Sheet for Patients:  EntrepreneurPulse.com.au  Fact Sheet for Healthcare Providers:  IncredibleEmployment.be  This test is no t yet approved or cleared by the [place] FDA and  has been authorized for detection and/or diagnosis of SARS-CoV-2 by FDA under an Emergency Use Authorization (EUA). This EUA will remain  in effect (meaning this test can be used) for the duration of the COVID-19 declaration under  Section 564(b)(1) of the Act, 21 U.S.C.section 360bbb-3(b)(1), unless the authorization is terminated  or revoked sooner.       Influenza Kaylee Wombles by PCR NEGATIVE NEGATIVE Final   Influenza B by PCR NEGATIVE NEGATIVE Final    Comment: (NOTE) The Xpert Xpress SARS-CoV-2/FLU/RSV plus assay is intended as an aid in the diagnosis of influenza from Nasopharyngeal swab specimens and should not be used as Yareli Carthen sole basis for treatment. Nasal washings and aspirates are unacceptable for Xpert Xpress SARS-CoV-2/FLU/RSV testing.  Fact Sheet for Patients: EntrepreneurPulse.com.au  Fact Sheet for Healthcare Providers: IncredibleEmployment.be  This test is not yet approved or cleared by the [place] FDA and has been authorized for detection and/or diagnosis of SARS-CoV-2 by FDA under an Emergency Use Authorization (EUA). This EUA will remain in effect (meaning this test can be used) for the duration of the COVID-19 declaration under Section 564(b)(1) of the Act, 21 U.S.C. section 360bbb-3(b)(1), unless the authorization is terminated or revoked.  Performed at [facility], [address], [place], [place] [address]   Blood culture (routine x 2)     Status: None   Collection Time: 12/18/20  1:25 PM   Specimen: Right Antecubital; Blood  Result Value Ref Range Status   Specimen Description   Final    RIGHT ANTECUBITAL Performed at [facility], [address] [place], [place] [address]    Special Requests   Final    Blood Culture adequate volume Performed at [facility], [address] [place], [place] [address]    Culture   Final    NO GROWTH 5 DAYS Performed at [facility], [place] [address] [place], [place] [address]    Report Status 12/23/2020 FINAL  Final  Blood culture (routine x 2)     Status: None   Collection Time: 12/18/20  1:44 PM   Specimen: Right Antecubital; Blood  Result  Value Ref Range Status   Specimen Description   Final    RIGHT ANTECUBITAL Performed at [facility], [address], [place], [place] [address]    Special Requests   Final    Blood Culture adequate volume Performed at [facility], [address] [place], [place] [address]    Culture   Final    NO GROWTH 5 DAYS Performed at [facility], [place] [address] [place], [place] [address]    Report Status 12/23/2020 FINAL  Final  Urine Culture     Status: None   Collection Time: 12/21/20 11:57 AM   Specimen: Urine, Clean Catch  Result Value Ref Range Status   Specimen Description URINE, CLEAN CATCH  Final   Special Requests NONE  Final   Culture   Final    NO GROWTH Performed at [facility], [place] [address] [place],  [address]    Report Status 12/22/2020 FINAL  Final         Radiology Studies: No results found.      Scheduled Meds:  aspirin EC  81 mg Oral Daily   atenolol  100 mg Oral Daily   cloNIDine  0.2 mg Oral BID   heparin  5,000 Units Subcutaneous Q8H   insulin aspart  0-5 Units Subcutaneous QHS   insulin aspart  0-9 Units Subcutaneous TID WC   liver oil-zinc oxide   Topical TID   senna  1 tablet Oral BID   Continuous Infusions:  sodium chloride Stopped (12/21/20 2036)     LOS: 8 days    Time spent: over 30 min    Fayrene Helper, MD [facility]   To contact the attending provider between 7A-7P or the covering provider during after hours 7P-7A, please log into the web site [email or web address] and access using universal [place] password for that web site. If you do not have the password, please call the hospital operator.  12/26/2020, 7:47 PM

## 2020-12-26 NOTE — Plan of Care (Signed)
Chart reviewed - patient still waiting on placement. Continue local wound care as ordered. Podiatry will sign off. Please contact with questions or concerns or should patient condition change. F/u with [place] at discharge.  Evelina Bucy, DPM

## 2020-12-26 NOTE — TOC Progression Note (Addendum)
Transition of Care [facility]) - Progression Note    Patient Details  Name: Jacqueline Richmond MRN: [id number] Date of Birth: 1945-06-11  [facility] CM/SW Contact  [name], LCSW Phone Number: 12/26/2020, 6:24 PM  Clinical Narrative:  Talked with daughter-in-law [name] [phone]) regarding placement and she was informed that [facility] only bed offer. [name] advised that facilities that didn't respond will be contacted. Daughter also agreed that patient's information could be sent [place]of-county, although a [facility] facility is the preference.   6:34 pm: Sent patient's information to other SNF's: [facility], [facility], [facility], [place], [place], [facility].   Expected Discharge Plan: [place] Barriers to Discharge: Continued Medical Work up, Orthoptist and Services Expected Discharge Plan: [place]       Living arrangements for the past 2 months: Single Family Home                                     Social Determinants of Health (SDOH) Interventions No SDOH interventions requested or needed at this time    Readmission Risk Interventions No flowsheet data found.

## 2020-12-26 NOTE — Progress Notes (Signed)
Occupational Therapy Treatment Patient Details Name: Jacqueline Richmond MRN: [id number] DOB: 29-Jul-1945 Today's Date: 12/26/2020   History of present illness 75 y.o. female presents to [facility] hospital on 12/18/2020 from podiatry with worsening R heel wound. PMH includes T2DM, HTN, HLD, CKD stage IV, gout, diverticulosis recently hospitalized 6/9-08/2012 for wound debridement and application of wound VAC on a previously operated T10 fracture site, discharged to SNF for 2 months prior to returning home.   OT comments  Pt agreeable to OT session. Pt with slow progress toward established OT goals. Pt requires maxA for LB dressing. She requires modA for bed mobility and maxA to standx2 at RW level. Pt tolerated standing for about 10seconds each trial. Pt limited by 8/10 in RLE. No bleeded noted from BLE this session, bilateral unna boots intact. Pt will continue to benefit from skilled OT services to maximize safety and independence with ADL/IADL and functional mobility. Will continue to follow acutely and progress as tolerated.     Recommendations for follow up therapy are one component of a multi-disciplinary discharge planning process, led by the attending physician.  Recommendations may be updated based on patient status, additional functional criteria and insurance authorization.    Follow Up Recommendations  SNF (Pt most likely will decline, however husband is unable to provide any physical assist to pt.)    Equipment Recommendations  None recommended by OT    Recommendations for Other Services      Precautions / Restrictions Precautions Precautions: Fall Precaution Comments: bleeding from bilat heels upon sitting EOB, R calf, R anterior 4th and 5th toe, L lateral calf Restrictions Weight Bearing Restrictions: No       Mobility Bed Mobility Overal bed mobility: Needs Assistance Bed Mobility: Rolling;Sidelying to Sit;Sit to Supine Rolling: Mod assist Sidelying to sit: Mod assist;HOB elevated      Sit to sidelying: Mod assist General bed mobility comments: modA with rolling r<>l and modA to progress trunk to upright position with HOB elevated    Transfers Overall transfer level: Needs assistance (deferred due to bleeding) Equipment used: Rolling walker (2 wheeled) Transfers: Sit to/from Stand Sit to Stand: Max assist         General transfer comment: maxA to powerup into full upright position;modA to boost butt off the bed. Pt reporting increased pain in RLE, no bleeding noted;pt completed sit<>stand from EOB x2, tolerated standing about 10seconds    Balance Overall balance assessment: Needs assistance Sitting-balance support: Bilateral upper extremity supported;Feet supported Sitting balance-Leahy Scale: Fair Sitting balance - Comments: pt tolerated sitting EOB with fair static sitting balance, instability noted with challenge to balance   Standing balance support: Bilateral upper extremity supported Standing balance-Leahy Scale: Zero Standing balance comment: dependent on external support maxA from therapist to powerup into upright posture                           ADL either performed or assessed with clinical judgement   ADL Overall ADL's : Needs assistance/impaired     Grooming: Set up;Sitting               Lower Body Dressing: Moderate assistance;Sitting/lateral leans Lower Body Dressing Details (indicate cue type and reason): assist to access BLE     Toileting- Clothing Manipulation and Hygiene: Maximal assistance;Sit to/from stand Toileting - Clothing Manipulation Details (indicate cue type and reason): sit<>standx2 from EOB     Functional mobility during ADLs: Maximal assistance;Rolling walker General ADL Comments: pt  sat EOB for about 62min with no noted bleeding from BLE;pt able to complete grooming and UB ADL after setup assistance     Vision   Vision Assessment?: No apparent visual deficits   Perception     Praxis       Cognition Arousal/Alertness: Awake/alert Behavior During Therapy: WFL for tasks assessed/performed Overall Cognitive Status: Within Functional Limits for tasks assessed                                 General Comments: cognition WFL for basic tasks, not formally assessed        Exercises     Shoulder Instructions       General Comments bilateral unna boots;no bleeding noted this session    Pertinent Vitals/ Pain       Pain Assessment: 0-10 Pain Score: 8  Pain Location: R foot Pain Descriptors / Indicators: Throbbing;Discomfort;Grimacing (worsened when LEs in dependent position) Pain Intervention(s): Monitored during session;Limited activity within patient's tolerance  Home Living                                          Prior Functioning/Environment              Frequency  Min 2X/week        Progress Toward Goals  OT Goals(current goals can now be found in the care plan section)  Progress towards OT goals: Progressing toward goals  Acute Rehab OT Goals Patient Stated Goal: to improve mobility and reduce falls risk OT Goal Formulation: With patient/family Time For Goal Achievement: 01/05/21 Potential to Achieve Goals: Good ADL Goals Pt Will Perform Grooming: with supervision;standing Pt Will Perform Lower Body Bathing: with min guard assist;sitting/lateral leans;sit to/from stand Pt Will Perform Lower Body Dressing: with min guard assist;sitting/lateral leans;sit to/from stand Pt Will Transfer to Toilet: with min guard assist;ambulating Pt Will Perform Toileting - Clothing Manipulation and hygiene: with min guard assist;sitting/lateral leans;sit to/from stand  Plan Discharge plan remains appropriate    Co-evaluation                 AM-PAC OT "6 Clicks" Daily Activity     Outcome Measure   Help from another person eating meals?: None Help from another person taking care of personal grooming?: A Little Help from  another person toileting, which includes using toliet, bedpan, or urinal?: A Lot Help from another person bathing (including washing, rinsing, drying)?: A Lot Help from another person to put on and taking off regular upper body clothing?: A Little Help from another person to put on and taking off regular lower body clothing?: A Lot 6 Click Score: 16    End of Session Equipment Utilized During Treatment: Gait belt;Rolling walker  OT Visit Diagnosis: Unsteadiness on feet (R26.81);Other abnormalities of gait and mobility (R26.89);Muscle weakness (generalized) (M62.81)   Activity Tolerance Patient tolerated treatment well;Patient limited by pain;Patient limited by fatigue   Patient Left in bed;with call bell/phone within reach;with bed alarm set;with family/visitor present   Nurse Communication Mobility         Time: 3825-0539 OT Time Calculation (min): 23 min  Charges: OT General Charges $OT Visit: 1 Visit OT Treatments $Self Care/Home Management : 23-37 mins  Helene Kelp OTR/L Acute Rehabilitation Services Office: [place] 12/26/2020, 3:11 PM

## 2020-12-27 DIAGNOSIS — E1151 Type 2 diabetes mellitus with diabetic peripheral angiopathy without gangrene: Secondary | ICD-10-CM | POA: Diagnosis not present

## 2020-12-27 DIAGNOSIS — L03115 Cellulitis of right lower limb: Secondary | ICD-10-CM | POA: Diagnosis not present

## 2020-12-27 DIAGNOSIS — E875 Hyperkalemia: Secondary | ICD-10-CM | POA: Diagnosis not present

## 2020-12-27 LAB — COMPREHENSIVE METABOLIC PANEL
ALT: 10 U/L (ref 0–44)
AST: 13 U/L — ABNORMAL LOW (ref 15–41)
Albumin: 1.9 g/dL — ABNORMAL LOW (ref 3.5–5.0)
Alkaline Phosphatase: 105 U/L (ref 38–126)
Anion gap: 7 (ref 5–15)
BUN: 33 mg/dL — ABNORMAL HIGH (ref 8–23)
CO2: 20 mmol/L — ABNORMAL LOW (ref 22–32)
Calcium: 9.1 mg/dL (ref 8.9–10.3)
Chloride: 110 mmol/L (ref 98–111)
Creatinine, Ser: 1.38 mg/dL — ABNORMAL HIGH (ref 0.44–1.00)
GFR, Estimated: 40 mL/min — ABNORMAL LOW (ref >60)
Glucose, Bld: 116 mg/dL — ABNORMAL HIGH (ref 70–99)
Potassium: 4.1 mmol/L (ref 3.5–5.1)
Sodium: 137 mmol/L (ref 135–145)
Total Bilirubin: 0.4 mg/dL (ref 0.3–1.2)
Total Protein: 6 g/dL — ABNORMAL LOW (ref 6.5–8.1)

## 2020-12-27 LAB — CBC WITH DIFFERENTIAL/PLATELET
Abs Immature Granulocytes: 0.03 10*3/uL (ref 0.00–0.07)
Basophils Absolute: 0.1 10*3/uL (ref 0.0–0.1)
Basophils Relative: 1 %
Eosinophils Absolute: 0.3 10*3/uL (ref 0.0–0.5)
Eosinophils Relative: 3 %
HCT: 30.1 % — ABNORMAL LOW (ref 36.0–46.0)
Hemoglobin: 9.1 g/dL — ABNORMAL LOW (ref 12.0–15.0)
Immature Granulocytes: 0 %
Lymphocytes Relative: 17 %
Lymphs Abs: 1.4 10*3/uL (ref 0.7–4.0)
MCH: 24.3 pg — ABNORMAL LOW (ref 26.0–34.0)
MCHC: 30.2 g/dL (ref 30.0–36.0)
MCV: 80.5 fL (ref 80.0–100.0)
Monocytes Absolute: 0.6 10*3/uL (ref 0.1–1.0)
Monocytes Relative: 7 %
Neutro Abs: 6 10*3/uL (ref 1.7–7.7)
Neutrophils Relative %: 72 %
Platelets: 217 10*3/uL (ref 150–400)
RBC: 3.74 MIL/uL — ABNORMAL LOW (ref 3.87–5.11)
RDW: 17.5 % — ABNORMAL HIGH (ref 11.5–15.5)
WBC: 8.3 10*3/uL (ref 4.0–10.5)
nRBC: 0 % (ref 0.0–0.2)

## 2020-12-27 LAB — GLUCOSE, CAPILLARY
Glucose-Capillary: 105 mg/dL — ABNORMAL HIGH (ref 70–99)
Glucose-Capillary: 125 mg/dL — ABNORMAL HIGH (ref 70–99)
Glucose-Capillary: 98 mg/dL (ref 70–99)
Glucose-Capillary: 98 mg/dL (ref 70–99)

## 2020-12-27 LAB — PHOSPHORUS: Phosphorus: 3.6 mg/dL (ref 2.5–4.6)

## 2020-12-27 LAB — MAGNESIUM: Magnesium: 1.7 mg/dL (ref 1.7–2.4)

## 2020-12-27 NOTE — Plan of Care (Signed)

## 2020-12-27 NOTE — Progress Notes (Signed)
PROGRESS NOTE    Jacqueline Richmond  OEU:[id number] DOB: 12/11/1945 DOA: 12/18/2020 PCP: Bernerd Limbo, MD   Chief Complaint  Patient presents with   leg wound    Brief Narrative:  85 old female with history of T2DM, HTN, HLD, CKD stage IV, gout, diverticulosis recently hospitalized 6/9-08/2012 for wound debridement and application of wound VAC on Garritt Molyneux previously operated T10 fracture site, has been in [facility] to for 2 months and ultimately discharged home few weeks prior to admission.  Patient states that while at [facility], she developed wound and ulcer in her right heel.On 9/27, patient presented to podiatry Dr. Pasty Arch office for evaluation of worsening wound on the right heel.  Previously, she was tried on Keflex and doxycycline by her PCP without improvement.   In the podiatrist office, she was noted to have redness around the wound with drainage, full-thickness ulceration of the right heel.  There was Hansel Devan Silvio Sausedo concern of osteomyelitis of the right heel with possible venous insufficiency.  Patient was directed to ED at [facility].    In the ED, patient was afebrile, hemodynamically stable, labs remarkable for hyperkalemia AKI.  X-rays of right tibia-fibula and foot were obtained.  It did not show any evidence of osteomyelitis.  Admitted for further work-up and management at [facility] and was transferred to [facility] for vascular evaluation due to abnormal ABI.    Seen by vascular felt to be chronic PVD and venous ulceration.  Recommended bilateral unna boots and offloading, not thought to be Marli Diego good revascularization candidate.  Follow up with VVS in 1 month for wound check, if wound deteriorating, will consider angiography.    Patient was placed on IV antibiotic briefly and subsequently changed to p.o. and complete the course.  Will need skilled nursing for placement and will need outpatient wound care follow-up.  Hospital course complicated with acute metabolic encephalopathy   Assessment & Plan:    Active Problems:   Leg wound, right   Diabetic foot (HCC)   Cellulitis of right lower leg   Venous insufficiency  Bilateral lower extremity ulceration and posterior heel ulceration with cellulitis  venous ulceration : ABI with noncompressible RLE artery - right TBI abnormal (absence of flow).  Left ABI with noncompressible LLE arteries, abnormal L TBI (severe small vessel disease).   Appreciate vascular and podiatry assistance Per vascular, venous disease mixed with PAD - wounds appear venous with right heel pressure ulcer Recommending bilateral UNNA boots, offloading.  Not Tramane Gorum good revascularization candidate.  Plan for 1 month follow up, consider angiography if wounds deteriorating. Wound recs per vascular and podiatry Currently getting unna boots twice weekly, silver alginate to heel wound on right and bilateral unna boot applications MRI heel without osteo - mild tendinosis of distal achilles tendon, partial thickness cartilage loss of tibiotalar joint, mild OA of talonavicular joint, OA of naviculaar medial cuneiform joint S/p abx 9/27 - 10/2  Chronic appearing PAD based on ABI with absent flow on tibial arteries: Continue aspirin 81 follow with vascular.  Has statin intolerance.     Acute metabolic encephalopathy: Patient is much more alert awake oriented and interactive.  Continue supportive care PT OT.  MRI brain showed chronic small vessel ischemic disease, overall mentation much better continue PT OT and supportive care.   Of note she had recent fall during thoracentesis in IR in May-had eeg and mri brain and neuro eval that time. Thought may be it was  Raad Clayson panic attack   Falls/Generalized weakness Hx of  fall and back injusry T10 fracture in May 10 Recently hospitalized then to SNF for Anniah Glick month:. Continue PT OT, awaiting snf.   AKI on CKD stage IV Metabolic acidosis:  Baseline creat 2.19/16 peaked at 3.5, improved better than the baseline.  Encourage oral intake.Off IV  fluids.  Hyperkalemia: Potassium is stable;off Lokelma.    T2DM stable A1c 5.45/22: controlled. continue to hold home Januvia  Essential hypertension: BP fairly stable on atenolol and clonidine.     HLD-has statin intolerance   History of gout-Allopurinol on hold-resume on d/c.   Microcytic anemia  Likely from chronic kidney disease.  Hemoglobin stable.   Pressure Injury 09/02/20 Heel Right Deep Tissue Pressure Injury - Purple or maroon localized area of discolored intact skin or blood-filled blister due to damage of underlying soft tissue from pressure and/or shear. DTI Right Heel (Active)  09/02/20 2230  Location: Heel  Location Orientation: Right  Staging: Deep Tissue Pressure Injury - Purple or maroon localized area of discolored intact skin or blood-filled blister due to damage of underlying soft tissue from pressure and/or shear.  Wound Description (Comments): DTI Right Heel  Present on Admission: -- (Per patient, she has had for Zyann Mabry while)     Pressure Injury 12/19/20 Vertebral column Mid Deep Tissue Pressure Injury - Purple or maroon localized area of discolored intact skin or blood-filled blister due to damage of underlying soft tissue from pressure and/or shear. (Active)  12/19/20 2254  Location: Vertebral column  Location Orientation: Mid  Staging: Deep Tissue Pressure Injury - Purple or maroon localized area of discolored intact skin or blood-filled blister due to damage of underlying soft tissue from pressure and/or shear.  Wound Description (Comments):   Present on Admission: Yes   DVT prophylaxis: heparin Code Status: full  Family Communication: none, husband did not answer - called back Disposition:   Status is: Inpatient  Remains inpatient appropriate because:Inpatient level of care appropriate due to severity of illness  Dispo: The patient is from: Home              Anticipated d/c is to: SNF              Patient currently is not medically stable to d/c.    Difficult to place patient No       Consultants:  Vascular Podiatry  Procedures:  ABI Summary:  Right: Resting right ankle-brachial index indicates noncompressible right  lower extremity arteries and monophasic waveforms. The right toe-brachial  index is abnormal (absence of flow).   Left: Resting left ankle-brachial index indicates noncompressible left  lower extremity arteries and monophasic waveforms. The left toe-brachial  index is abnormal (severe small vessel disease).   Antimicrobials:  Anti-infectives (From admission, onward)    Start     Dose/Rate Route Frequency Ordered Stop   12/21/20 1045  cephALEXin (KEFLEX) capsule 500 mg        500 mg Oral Every 12 hours 12/21/20 0954 12/23/20 2126   12/20/20 0730  vancomycin (VANCOCIN) IVPB 1000 mg/200 mL premix        1,000 mg 200 mL/hr over 60 Minutes Intravenous  Once 12/20/20 0637 12/20/20 1202   12/20/20 0730  piperacillin-tazobactam (ZOSYN) IVPB 2.25 g  Status:  Discontinued        2.25 g 100 mL/hr over 30 Minutes Intravenous Every 8 hours 12/20/20 0638 12/21/20 0854   12/19/20 1400  piperacillin-tazobactam (ZOSYN) IVPB 3.375 g  Status:  Discontinued        3.375 g 12.5  mL/hr over 240 Minutes Intravenous Every 8 hours 12/19/20 0715 12/19/20 0716   12/19/20 1400  piperacillin-tazobactam (ZOSYN) IVPB 3.375 g  Status:  Discontinued        3.375 g 100 mL/hr over 30 Minutes Intravenous Every 8 hours 12/19/20 0718 12/20/20 0638   12/18/20 2200  piperacillin-tazobactam (ZOSYN) IVPB 2.25 g  Status:  Discontinued        2.25 g 100 mL/hr over 30 Minutes Intravenous Every 8 hours 12/18/20 1842 12/19/20 0715   12/18/20 1555  vancomycin variable dose per unstable renal function (pharmacist dosing)  Status:  Discontinued         Does not apply See admin instructions 12/18/20 1555 12/21/20 0854   12/18/20 1500  vancomycin (VANCOCIN) IVPB 1000 mg/200 mL premix        1,000 mg 200 mL/hr over 60 Minutes Intravenous  Once 12/18/20  1458 12/18/20 1622   12/18/20 1400  piperacillin-tazobactam (ZOSYN) IVPB 3.375 g        3.375 g 100 mL/hr over 30 Minutes Intravenous  Once 12/18/20 1349 12/18/20 1510          Subjective: Denies complaints  Objective: Vitals:   12/26/20 2103 12/27/20 0444 12/27/20 0915 12/27/20 1625  BP: (!) 155/64 (!) 142/72 (!) 141/53 (!) 144/62  Pulse:  74 68 72  Resp: 17 16 18 18   Temp: 98.3 F (36.8 C) 98.3 F (36.8 C) 98.2 F (36.8 C) 98 F (36.7 C)  TempSrc: Oral Oral  Oral  SpO2: 99% 98% 100% 98%  Weight: 66.6 kg     Height:        Intake/Output Summary (Last 24 hours) at 12/27/2020 1915 Last data filed at 12/27/2020 1700 Gross per 24 hour  Intake 720 ml  Output 0 ml  Net 720 ml   Filed Weights   12/20/20 2116 12/25/20 2058 12/26/20 2103  Weight: 66.6 kg 66.6 kg 66.6 kg    Examination:  General: No acute distress. Cardiovascular: RRR Lungs: unlabored Abdomen: Soft, nontender, nondistended  Neurological: Alert and oriented 3. Moves all extremities 4 h. Cranial nerves II through XII grossly intact. Skin: Warm and dry. No rashes or lesions. Extremities: bilateral unna boots - examined when dressing changed, ulceration to R heel, areas of scabbing, erythema    Data Reviewed: I have personally reviewed following labs and imaging studies  CBC: Recent Labs  Lab 12/21/20 0326 12/22/20 0326 12/23/20 0158 12/24/20 0205 12/25/20 0816 12/27/20 0333  WBC 11.1* 8.7 8.3 9.3  --  8.3  NEUTROABS  --   --   --   --   --  6.0  HGB 10.5* 9.7* 8.8* 8.9* 9.0* 9.1*  HCT 34.0* 31.9* 29.9* 29.5* 30.1* 30.1*  MCV 79.4* 80.4 81.5 80.6  --  80.5  PLT 290 255 205 186  --  361    Basic Metabolic Panel: Recent Labs  Lab 12/21/20 0326 12/22/20 0326 12/23/20 0158 12/24/20 0205 12/27/20 0333  NA 141 143 140 137 137  K 4.2 4.0 3.8 3.7 4.1  CL 112* 116* 114* 110 110  CO2 20* 20* 20* 21* 20*  GLUCOSE 114* 88 91 133* 116*  BUN 60* 46* 37* 29* 33*  CREATININE 2.24* 1.91* 1.53*  1.58* 1.38*  CALCIUM 9.0 8.9 8.8* 8.5* 9.1  MG  --   --   --   --  1.7  PHOS  --   --   --   --  3.6    GFR: Estimated Creatinine Clearance: 33.6 mL/min (  Melayna Robarts) (by C-G formula based on SCr of 1.38 mg/dL (H)).  Liver Function Tests: Recent Labs  Lab 12/27/20 0333  AST 13*  ALT 10  ALKPHOS 105  BILITOT 0.4  PROT 6.0*  ALBUMIN 1.9*    CBG: Recent Labs  Lab 12/26/20 1617 12/26/20 2101 12/27/20 0706 12/27/20 1116 12/27/20 1620  GLUCAP 211* 134* 105* 125* 98     Recent Results (from the past 240 hour(s))  Resp Panel by RT-PCR (Flu Tarryn Bogdan&B, Covid) Nasopharyngeal Swab     Status: None   Collection Time: 12/18/20  1:23 PM   Specimen: Nasopharyngeal Swab; Nasopharyngeal(NP) swabs in vial transport medium  Result Value Ref Range Status   SARS Coronavirus 2 by RT PCR NEGATIVE NEGATIVE Final    Comment: (NOTE) SARS-CoV-2 target nucleic acids are NOT DETECTED.  The SARS-CoV-2 RNA is generally detectable in upper respiratory specimens during the acute phase of infection. The lowest concentration of SARS-CoV-2 viral copies this assay can detect is 138 copies/mL. Gila Lauf negative result does not preclude SARS-Cov-2 infection and should not be used as the sole basis for treatment or other patient management decisions. Koltin Wehmeyer negative result may occur with  improper specimen collection/handling, submission of specimen other than nasopharyngeal swab, presence of viral mutation(s) within the areas targeted by this assay, and inadequate number of viral copies(<138 copies/mL). Boruch Manuele negative result must be combined with clinical observations, patient history, and epidemiological information. The expected result is Negative.  Fact Sheet for Patients:  EntrepreneurPulse.com.au  Fact Sheet for Healthcare Providers:  IncredibleEmployment.be  This test is no t yet approved or cleared by the [place] FDA and  has been authorized for detection and/or diagnosis of  SARS-CoV-2 by FDA under an Emergency Use Authorization (EUA). This EUA will remain  in effect (meaning this test can be used) for the duration of the COVID-19 declaration under Section 564(b)(1) of the Act, 21 U.S.C.section 360bbb-3(b)(1), unless the authorization is terminated  or revoked sooner.       Influenza Jenan Ellegood by PCR NEGATIVE NEGATIVE Final   Influenza B by PCR NEGATIVE NEGATIVE Final    Comment: (NOTE) The Xpert Xpress SARS-CoV-2/FLU/RSV plus assay is intended as an aid in the diagnosis of influenza from Nasopharyngeal swab specimens and should not be used as Yaman Grauberger sole basis for treatment. Nasal washings and aspirates are unacceptable for Xpert Xpress SARS-CoV-2/FLU/RSV testing.  Fact Sheet for Patients: EntrepreneurPulse.com.au  Fact Sheet for Healthcare Providers: IncredibleEmployment.be  This test is not yet approved or cleared by the [place] FDA and has been authorized for detection and/or diagnosis of SARS-CoV-2 by FDA under an Emergency Use Authorization (EUA). This EUA will remain in effect (meaning this test can be used) for the duration of the COVID-19 declaration under Section 564(b)(1) of the Act, 21 U.S.C. section 360bbb-3(b)(1), unless the authorization is terminated or revoked.  Performed at [facility], [address], [place], [place] [address]   Blood culture (routine x 2)     Status: None   Collection Time: 12/18/20  1:25 PM   Specimen: Right Antecubital; Blood  Result Value Ref Range Status   Specimen Description   Final    RIGHT ANTECUBITAL Performed at [facility], [address] [place], [place] [address]    Special Requests   Final    Blood Culture adequate volume Performed at [facility], [address], [place], [place] [address]    Culture   Final    NO GROWTH 5 DAYS Performed at [facility], [place] [address], [place], [place]  [address]    Report Status 12/23/2020 FINAL  Final  Blood culture (routine x 2)     Status: None   Collection Time: 12/18/20  1:44 PM   Specimen: Right Antecubital; Blood  Result Value Ref Range Status   Specimen Description   Final    RIGHT ANTECUBITAL Performed at [facility], [address], [place], [place] [address]    Special Requests   Final    Blood Culture adequate volume Performed at [facility], [address] [place], [place] [address]    Culture   Final    NO GROWTH 5 DAYS Performed at [facility], [place] [address], [place], [place] [address]    Report Status 12/23/2020 FINAL  Final  Urine Culture     Status: None   Collection Time: 12/21/20 11:57 AM   Specimen: Urine, Clean Catch  Result Value Ref Range Status   Specimen Description URINE, CLEAN CATCH  Final   Special Requests NONE  Final   Culture   Final    NO GROWTH Performed at [facility], [place] [address], [place], [place] [address]    Report Status 12/22/2020 FINAL  Final         Radiology Studies: No results found.      Scheduled Meds:  aspirin EC  81 mg Oral Daily   atenolol  100 mg Oral Daily   cloNIDine  0.2 mg Oral BID   heparin  5,000 Units Subcutaneous Q8H   insulin aspart  0-5 Units Subcutaneous QHS   insulin aspart  0-9 Units Subcutaneous TID WC   liver oil-zinc oxide   Topical TID   senna  1 tablet Oral BID   Continuous Infusions:  sodium chloride Stopped (12/21/20 2036)     LOS: 9 days    Time spent: over 30 min    Fayrene Helper, MD [facility]   To contact the attending provider between 7A-7P or the covering provider during after hours 7P-7A, please log into the web site [email or web address] and access using universal [place] password for that web site. If you do not have the password, please call the hospital operator.  12/27/2020, 7:15 PM

## 2020-12-27 NOTE — Progress Notes (Signed)
Physical Therapy Treatment Patient Details Name: Jacqueline Richmond MRN: [id number] DOB: 1946/01/31 Today's Date: 12/27/2020   History of Present Illness 75 y.o. female presents to [facility] hospital on 12/18/2020 from podiatry with worsening R heel wound. PMH includes T2DM, HTN, HLD, CKD stage IV, gout, diverticulosis recently hospitalized 6/9-08/2012 for wound debridement and application of wound VAC on a previously operated T10 fracture site, discharged to SNF for 2 months prior to returning home.    PT Comments    PT session limited, RN requesting PT hold functional mobility as pt awaiting application of new unna boot dressings to BLE. Pt with significant risk of bleeding without boots intact when mobilizing lower extremities. Pt participates in UE strengthening with theraband as documented below. Pt mentation is much improved compared to initial eval with this PT, communicating without difficulty, alert and well oriented to plan. Pt will benefit from continued acute PT services to improve functional mobility quality and to reduce falls risk. PT continues to recommend discharge to SNF.   Recommendations for follow up therapy are one component of a multi-disciplinary discharge planning process, led by the attending physician.  Recommendations may be updated based on patient status, additional functional criteria and insurance authorization.  Follow Up Recommendations  SNF     Equipment Recommendations   (defer to post-acute)    Recommendations for Other Services       Precautions / Restrictions Precautions Precautions: Fall Precaution Comments: bleeding from bilat heels upon sitting EOB, R calf, R anterior 4th and 5th toe, L lateral calf Restrictions Weight Bearing Restrictions: No     Mobility  Bed Mobility Overal bed mobility:  (deferred, RN requesting PT hold functional mobility due to risk of bleeding as LEs preparing for dressing change)                  Transfers                     Ambulation/Gait                 Stairs             Wheelchair Mobility    Modified Rankin (Stroke Patients Only)       Balance                                            Cognition Arousal/Alertness: Awake/alert Behavior During Therapy: WFL for tasks assessed/performed Overall Cognitive Status: Within Functional Limits for tasks assessed                                 General Comments: pt with much improved mentation and communication compared to eval with this PT. No significant deficits noted this session      Exercises General Exercises - Upper Extremity Shoulder Flexion: AROM;Both;10 reps;Theraband Theraband Level (Shoulder Flexion): Other (comment) (orange level 2) Shoulder Horizontal ADduction: AROM;Both;10 reps;Theraband (pt with shoulder ROM limitations, shoulder horizontal adduction limited to just past 90 degrees) Theraband Level (Shoulder Horizontal Adduction): Level 2 (Red) (orange) Elbow Flexion: AROM;Both;10 reps;Theraband Theraband Level (Elbow Flexion): Level 2 (Red) Elbow Extension: AROM;Both;10 reps;Theraband Theraband Level (Elbow Extension): Level 2 (Red)    General Comments General comments (skin integrity, edema, etc.): BLE scabs noted in various spots over legs      Pertinent Vitals/Pain  Pain Assessment: Faces Faces Pain Scale: Hurts even more Pain Location: BLE Pain Descriptors / Indicators: Grimacing Pain Intervention(s): Monitored during session    Home Living                      Prior Function            PT Goals (current goals can now be found in the care plan section) Acute Rehab PT Goals Patient Stated Goal: to improve mobility and reduce falls risk Progress towards PT goals: Not progressing toward goals - comment (limited by concern for bleeding without unna boots donned)    Frequency    Min 2X/week      PT Plan Current plan remains appropriate     Co-evaluation              AM-PAC PT "6 Clicks" Mobility   Outcome Measure  Help needed turning from your back to your side while in a flat bed without using bedrails?: A Lot Help needed moving from lying on your back to sitting on the side of a flat bed without using bedrails?: A Lot Help needed moving to and from a bed to a chair (including a wheelchair)?: A Lot Help needed standing up from a chair using your arms (e.g., wheelchair or bedside chair)?: A Lot Help needed to walk in hospital room?: Total Help needed climbing 3-5 steps with a railing? : Total 6 Click Score: 10    End of Session   Activity Tolerance: Treatment limited secondary to medical complications (Comment) Patient left: in bed;with call bell/phone within reach;with bed alarm set;with nursing/sitter in room Nurse Communication: Mobility status PT Visit Diagnosis: Other abnormalities of gait and mobility (R26.89);Muscle weakness (generalized) (M62.81);Pain Pain - Right/Left:  (bilateral LEs) Pain - part of body: Leg     Time: 8657-8469 PT Time Calculation (min) (ACUTE ONLY): 12 min  Charges:  $Therapeutic Exercise: 8-22 mins                     Zenaida Niece, PT, DPT Acute Rehabilitation Pager: [phone]    Zenaida Niece 12/27/2020, 4:08 PM

## 2020-12-28 DIAGNOSIS — E875 Hyperkalemia: Secondary | ICD-10-CM | POA: Diagnosis not present

## 2020-12-28 DIAGNOSIS — E1151 Type 2 diabetes mellitus with diabetic peripheral angiopathy without gangrene: Secondary | ICD-10-CM | POA: Diagnosis not present

## 2020-12-28 DIAGNOSIS — S81801D Unspecified open wound, right lower leg, subsequent encounter: Secondary | ICD-10-CM | POA: Diagnosis not present

## 2020-12-28 LAB — COMPREHENSIVE METABOLIC PANEL
ALT: 10 U/L (ref 0–44)
AST: 15 U/L (ref 15–41)
Albumin: 1.9 g/dL — ABNORMAL LOW (ref 3.5–5.0)
Alkaline Phosphatase: 108 U/L (ref 38–126)
Anion gap: 7 (ref 5–15)
BUN: 33 mg/dL — ABNORMAL HIGH (ref 8–23)
CO2: 20 mmol/L — ABNORMAL LOW (ref 22–32)
Calcium: 9.2 mg/dL (ref 8.9–10.3)
Chloride: 110 mmol/L (ref 98–111)
Creatinine, Ser: 1.32 mg/dL — ABNORMAL HIGH (ref 0.44–1.00)
GFR, Estimated: 42 mL/min — ABNORMAL LOW (ref >60)
Glucose, Bld: 99 mg/dL (ref 70–99)
Potassium: 4.2 mmol/L (ref 3.5–5.1)
Sodium: 137 mmol/L (ref 135–145)
Total Bilirubin: 0.4 mg/dL (ref 0.3–1.2)
Total Protein: 5.8 g/dL — ABNORMAL LOW (ref 6.5–8.1)

## 2020-12-28 LAB — CBC WITH DIFFERENTIAL/PLATELET
Abs Immature Granulocytes: 0.02 10*3/uL (ref 0.00–0.07)
Basophils Absolute: 0.1 10*3/uL (ref 0.0–0.1)
Basophils Relative: 1 %
Eosinophils Absolute: 0.2 10*3/uL (ref 0.0–0.5)
Eosinophils Relative: 3 %
HCT: 30.3 % — ABNORMAL LOW (ref 36.0–46.0)
Hemoglobin: 9.4 g/dL — ABNORMAL LOW (ref 12.0–15.0)
Immature Granulocytes: 0 %
Lymphocytes Relative: 22 %
Lymphs Abs: 1.5 10*3/uL (ref 0.7–4.0)
MCH: 24.8 pg — ABNORMAL LOW (ref 26.0–34.0)
MCHC: 31 g/dL (ref 30.0–36.0)
MCV: 79.9 fL — ABNORMAL LOW (ref 80.0–100.0)
Monocytes Absolute: 0.5 10*3/uL (ref 0.1–1.0)
Monocytes Relative: 8 %
Neutro Abs: 4.6 10*3/uL (ref 1.7–7.7)
Neutrophils Relative %: 66 %
Platelets: 238 10*3/uL (ref 150–400)
RBC: 3.79 MIL/uL — ABNORMAL LOW (ref 3.87–5.11)
RDW: 17.8 % — ABNORMAL HIGH (ref 11.5–15.5)
WBC: 7 10*3/uL (ref 4.0–10.5)
nRBC: 0 % (ref 0.0–0.2)

## 2020-12-28 LAB — GLUCOSE, CAPILLARY
Glucose-Capillary: 94 mg/dL (ref 70–99)
Glucose-Capillary: 95 mg/dL (ref 70–99)
Glucose-Capillary: 118 mg/dL — ABNORMAL HIGH (ref 70–99)
Glucose-Capillary: 104 mg/dL — ABNORMAL HIGH (ref 70–99)

## 2020-12-28 LAB — PHOSPHORUS: Phosphorus: 3.5 mg/dL (ref 2.5–4.6)

## 2020-12-28 LAB — MAGNESIUM: Magnesium: 1.8 mg/dL (ref 1.7–2.4)

## 2020-12-28 NOTE — TOC Progression Note (Signed)
Transition of Care [facility]) - Progression Note    Patient Details  Name: Jacqueline Richmond MRN: [id number] Date of Birth: 1945-10-10  [facility] CM/SW Contact  [name], LCSW Phone Number: 12/28/2020, 4:13 PM  Clinical Narrative: CSW met with patient and her daughter-in-law Jacqueline Richmond at the bedside to discuss facility search response. Jacqueline Richmond had been given the 2 bed offers: [facility] and [facility],  and was hoping that SNF's that didn't respond or originally declined may have a bed next week, or when patient ready for discharge. Jacqueline Richmond was provided with the SF's that declined and why and also the SNF's that didn't respond. Jacqueline Richmond advised CSW that she may call some of the SNF's that didn't respond to determine if they can consider her mother-in-law.   Expected Discharge Plan: [place] Barriers to Discharge: Continued Medical Work up, Ship broker  Expected Discharge Plan and Services Expected Discharge Plan: [place]       Living arrangements for the past 2 months: Single Family Home                                       Social Determinants of Health (SDOH) Interventions  No SDOH interventions requested or needed at this time.  Readmission Risk Interventions No flowsheet data found.

## 2020-12-28 NOTE — Progress Notes (Signed)
Patient requested for both bed rails to be up. Feels safer with both up and can grab to move around.

## 2020-12-28 NOTE — Progress Notes (Signed)
PROGRESS NOTE    Jacqueline Richmond  QIO:[id number] DOB: 04/14/1945 DOA: 12/18/2020 PCP: Bernerd Limbo, MD   Chief Complaint  Patient presents with   leg wound    Brief Narrative:  57 old female with history of T2DM, HTN, HLD, CKD stage IV, gout, diverticulosis recently hospitalized 6/9-08/2012 for wound debridement and application of wound VAC on Jacqueline Richmond previously operated T10 fracture site, has been in [place] SNF to for 2 months and ultimately discharged home few weeks prior to admission.  Patient states that while at [facility], she developed wound and ulcer in her right heel.On 9/27, patient presented to podiatry Dr. Pasty Arch office for evaluation of worsening wound on the right heel.  Previously, she was tried on Keflex and doxycycline by her PCP without improvement.   In the podiatrist office, she was noted to have redness around the wound with drainage, full-thickness ulceration of the right heel.  There was Eddye Broxterman Asyah Candler concern of osteomyelitis of the right heel with possible venous insufficiency.  Patient was directed to ED at [facility].    In the ED, patient was afebrile, hemodynamically stable, labs remarkable for hyperkalemia AKI.  X-rays of right tibia-fibula and foot were obtained.  It did not show any evidence of osteomyelitis.  Admitted for further work-up and management at [facility] and was transferred to [facility] for vascular evaluation due to abnormal ABI.    Seen by vascular felt to be chronic PVD and venous ulceration.  Recommended bilateral unna boots and offloading, not thought to be Javonte Elenes good revascularization candidate.  Follow up with VVS in 1 month for wound check, if wound deteriorating, will consider angiography.    Patient was placed on IV antibiotic briefly and subsequently changed to p.o. and complete the course.  Will need skilled nursing for placement and will need outpatient wound care follow-up.  Hospital course complicated with acute metabolic encephalopathy  Awaiting SNF.  Assessment  & Plan:   Active Problems:   Leg wound, right   Diabetic foot (HCC)   Cellulitis of right lower leg   Venous insufficiency  Bilateral lower extremity ulceration and posterior heel ulceration with cellulitis  venous ulceration : ABI with noncompressible RLE artery - right TBI abnormal (absence of flow).  Left ABI with noncompressible LLE arteries, abnormal L TBI (severe small vessel disease).   Appreciate vascular and podiatry assistance Per vascular, venous disease mixed with PAD - wounds appear venous with right heel pressure ulcer Recommending bilateral UNNA boots, offloading.  Not Gwenetta Devos good revascularization candidate.  Plan for 1 month follow up, consider angiography if wounds deteriorating. Wound recs per vascular and podiatry Currently getting unna boots twice weekly, silver alginate to heel wound on right and bilateral unna boot applications MRI heel without osteo - mild tendinosis of distal achilles tendon, partial thickness cartilage loss of tibiotalar joint, mild OA of talonavicular joint, OA of naviculaar medial cuneiform joint S/p abx 9/27 - 10/2  Chronic appearing PAD based on ABI with absent flow on tibial arteries: Continue aspirin 81 follow with vascular.  Has statin intolerance.     Acute metabolic encephalopathy: Patient is much more alert awake oriented and interactive.  Continue supportive care PT OT.  MRI brain showed chronic small vessel ischemic disease, overall mentation much better continue PT OT and supportive care.   Of note she had recent fall during thoracentesis in IR in May-had eeg and mri brain and neuro eval that time. Thought may be it was  Jacqueline Richmond panic attack   Falls/Generalized weakness  Hx of fall and back injusry T10 fracture in May 10 Recently hospitalized then to SNF for Anterrio Mccleery month:. Continue PT OT, awaiting snf.   AKI on CKD stage IV Metabolic acidosis:  Baseline creat 2.19/16 peaked at 3.5, improved better than the baseline.  Encourage oral intake.Off IV  fluids.  Hyperkalemia: Potassium is stable;off Lokelma.    T2DM stable A1c 5.45/22: controlled. continue to hold home Januvia  Essential hypertension: BP fairly stable on atenolol and clonidine.     HLD-has statin intolerance   History of gout-Allopurinol on hold-resume on d/c.   Microcytic anemia  Likely from chronic kidney disease.  Hemoglobin stable.   Pressure Injury 09/02/20 Heel Right Deep Tissue Pressure Injury - Purple or maroon localized area of discolored intact skin or blood-filled blister due to damage of underlying soft tissue from pressure and/or shear. DTI Right Heel (Active)  09/02/20 2230  Location: Heel  Location Orientation: Right  Staging: Deep Tissue Pressure Injury - Purple or maroon localized area of discolored intact skin or blood-filled blister due to damage of underlying soft tissue from pressure and/or shear.  Wound Description (Comments): DTI Right Heel  Present on Admission: -- (Per patient, she has had for Dwaine Pringle while)     Pressure Injury 12/19/20 Vertebral column Mid Deep Tissue Pressure Injury - Purple or maroon localized area of discolored intact skin or blood-filled blister due to damage of underlying soft tissue from pressure and/or shear. (Active)  12/19/20 2254  Location: Vertebral column  Location Orientation: Mid  Staging: Deep Tissue Pressure Injury - Purple or maroon localized area of discolored intact skin or blood-filled blister due to damage of underlying soft tissue from pressure and/or shear.  Wound Description (Comments):   Present on Admission: Yes   DVT prophylaxis: heparin Code Status: full  Family Communication: none, husband did not answer - called back 10/5 Disposition:   Status is: Inpatient  Remains inpatient appropriate because:Inpatient level of care appropriate due to severity of illness  Dispo: The patient is from: Home              Anticipated d/c is to: SNF              Patient currently is not medically stable to  d/c.   Difficult to place patient No       Consultants:  Vascular Podiatry  Procedures:  ABI Summary:  Right: Resting right ankle-brachial index indicates noncompressible right  lower extremity arteries and monophasic waveforms. The right toe-brachial  index is abnormal (absence of flow).   Left: Resting left ankle-brachial index indicates noncompressible left  lower extremity arteries and monophasic waveforms. The left toe-brachial  index is abnormal (severe small vessel disease).   Antimicrobials:  Anti-infectives (From admission, onward)    Start     Dose/Rate Route Frequency Ordered Stop   12/21/20 1045  cephALEXin (KEFLEX) capsule 500 mg        500 mg Oral Every 12 hours 12/21/20 0954 12/23/20 2126   12/20/20 0730  vancomycin (VANCOCIN) IVPB 1000 mg/200 mL premix        1,000 mg 200 mL/hr over 60 Minutes Intravenous  Once 12/20/20 0637 12/20/20 1202   12/20/20 0730  piperacillin-tazobactam (ZOSYN) IVPB 2.25 g  Status:  Discontinued        2.25 g 100 mL/hr over 30 Minutes Intravenous Every 8 hours 12/20/20 0638 12/21/20 0854   12/19/20 1400  piperacillin-tazobactam (ZOSYN) IVPB 3.375 g  Status:  Discontinued  3.375 g 12.5 mL/hr over 240 Minutes Intravenous Every 8 hours 12/19/20 0715 12/19/20 0716   12/19/20 1400  piperacillin-tazobactam (ZOSYN) IVPB 3.375 g  Status:  Discontinued        3.375 g 100 mL/hr over 30 Minutes Intravenous Every 8 hours 12/19/20 0718 12/20/20 0638   12/18/20 2200  piperacillin-tazobactam (ZOSYN) IVPB 2.25 g  Status:  Discontinued        2.25 g 100 mL/hr over 30 Minutes Intravenous Every 8 hours 12/18/20 1842 12/19/20 0715   12/18/20 1555  vancomycin variable dose per unstable renal function (pharmacist dosing)  Status:  Discontinued         Does not apply See admin instructions 12/18/20 1555 12/21/20 0854   12/18/20 1500  vancomycin (VANCOCIN) IVPB 1000 mg/200 mL premix        1,000 mg 200 mL/hr over 60 Minutes Intravenous  Once  12/18/20 1458 12/18/20 1622   12/18/20 1400  piperacillin-tazobactam (ZOSYN) IVPB 3.375 g        3.375 g 100 mL/hr over 30 Minutes Intravenous  Once 12/18/20 1349 12/18/20 1510          Subjective: Denies complaints  Objective: Vitals:   12/27/20 2037 12/28/20 0501 12/28/20 0903 12/28/20 1041  BP: (!) 153/61 (!) 150/48 (!) 132/50 133/62  Pulse: 70 67 66   Resp: 16 16 16    Temp: 98.7 F (37.1 C) 98.3 F (36.8 C) (!) 97.5 F (36.4 C)   TempSrc:   Oral   SpO2: 95% 98% 99%   Weight:  67.4 kg    Height:        Intake/Output Summary (Last 24 hours) at 12/28/2020 1542 Last data filed at 12/27/2020 1700 Gross per 24 hour  Intake 240 ml  Output 0 ml  Net 240 ml   Filed Weights   12/25/20 2058 12/26/20 2103 12/28/20 0501  Weight: 66.6 kg 66.6 kg 67.4 kg    Examination:  General: No acute distress. Cardiovascular: RRR Lungs: unlabored Abdomen: Soft, nontender, nondistended  Neurological: Alert and oriented 3. Moves all extremities 4 . Cranial nerves II through XII grossly intact. Skin: Warm and dry. No rashes or lesions. Extremities: bilateral unna boots, prafo boots    Data Reviewed: I have personally reviewed following labs and imaging studies  CBC: Recent Labs  Lab 12/22/20 0326 12/23/20 0158 12/24/20 0205 12/25/20 0816 12/27/20 0333 12/28/20 0327  WBC 8.7 8.3 9.3  --  8.3 7.0  NEUTROABS  --   --   --   --  6.0 4.6  HGB 9.7* 8.8* 8.9* 9.0* 9.1* 9.4*  HCT 31.9* 29.9* 29.5* 30.1* 30.1* 30.3*  MCV 80.4 81.5 80.6  --  80.5 79.9*  PLT 255 205 186  --  217 035    Basic Metabolic Panel: Recent Labs  Lab 12/22/20 0326 12/23/20 0158 12/24/20 0205 12/27/20 0333 12/28/20 0327  NA 143 140 137 137 137  K 4.0 3.8 3.7 4.1 4.2  CL 116* 114* 110 110 110  CO2 20* 20* 21* 20* 20*  GLUCOSE 88 91 133* 116* 99  BUN 46* 37* 29* 33* 33*  CREATININE 1.91* 1.53* 1.58* 1.38* 1.32*  CALCIUM 8.9 8.8* 8.5* 9.1 9.2  MG  --   --   --  1.7 1.8  PHOS  --   --   --  3.6  3.5    GFR: Estimated Creatinine Clearance: 35.3 mL/min (Huberta Tompkins) (by C-G formula based on SCr of 1.32 mg/dL (H)).  Liver Function Tests: Recent Labs  Lab 12/27/20 0333 12/28/20 0327  AST 13* 15  ALT 10 10  ALKPHOS 105 108  BILITOT 0.4 0.4  PROT 6.0* 5.8*  ALBUMIN 1.9* 1.9*    CBG: Recent Labs  Lab 12/27/20 1116 12/27/20 1620 12/27/20 2034 12/28/20 0637 12/28/20 1129  GLUCAP 125* 98 98 94 95     Recent Results (from the past 240 hour(s))  Urine Culture     Status: None   Collection Time: 12/21/20 11:57 AM   Specimen: Urine, Clean Catch  Result Value Ref Range Status   Specimen Description URINE, CLEAN CATCH  Final   Special Requests NONE  Final   Culture   Final    NO GROWTH Performed at [facility], [facility] [address] [place], [place] [address]    Report Status 12/22/2020 FINAL  Final         Radiology Studies: No results found.      Scheduled Meds:  aspirin EC  81 mg Oral Daily   atenolol  100 mg Oral Daily   cloNIDine  0.2 mg Oral BID   heparin  5,000 Units Subcutaneous Q8H   insulin aspart  0-5 Units Subcutaneous QHS   insulin aspart  0-9 Units Subcutaneous TID WC   liver oil-zinc oxide   Topical TID   senna  1 tablet Oral BID   Continuous Infusions:  sodium chloride Stopped (12/21/20 2036)     LOS: 10 days    Time spent: over 30 min    Fayrene Helper, MD [facility]   To contact the attending provider between 7A-7P or the covering provider during after hours 7P-7A, please log into the web site [email or web address] and access using universal [place] password for that web site. If you do not have the password, please call the hospital operator.  12/28/2020, 3:42 PM

## 2020-12-28 NOTE — Plan of Care (Signed)
  Problem: Nutrition: Goal: Adequate nutrition will be maintained Outcome: Progressing   Problem: Elimination: Goal: Will not experience complications related to urinary retention Outcome: Progressing   

## 2020-12-29 DIAGNOSIS — E875 Hyperkalemia: Secondary | ICD-10-CM | POA: Diagnosis not present

## 2020-12-29 DIAGNOSIS — E1151 Type 2 diabetes mellitus with diabetic peripheral angiopathy without gangrene: Secondary | ICD-10-CM | POA: Diagnosis not present

## 2020-12-29 DIAGNOSIS — S81801D Unspecified open wound, right lower leg, subsequent encounter: Secondary | ICD-10-CM | POA: Diagnosis not present

## 2020-12-29 LAB — BASIC METABOLIC PANEL
Anion gap: 6 (ref 5–15)
BUN: 35 mg/dL — ABNORMAL HIGH (ref 8–23)
CO2: 20 mmol/L — ABNORMAL LOW (ref 22–32)
Calcium: 9.1 mg/dL (ref 8.9–10.3)
Chloride: 110 mmol/L (ref 98–111)
Creatinine, Ser: 1.34 mg/dL — ABNORMAL HIGH (ref 0.44–1.00)
GFR, Estimated: 42 mL/min — ABNORMAL LOW (ref >60)
Glucose, Bld: 99 mg/dL (ref 70–99)
Potassium: 4.4 mmol/L (ref 3.5–5.1)
Sodium: 136 mmol/L (ref 135–145)

## 2020-12-29 LAB — CBC
HCT: 28.2 % — ABNORMAL LOW (ref 36.0–46.0)
Hemoglobin: 8.8 g/dL — ABNORMAL LOW (ref 12.0–15.0)
MCH: 24.9 pg — ABNORMAL LOW (ref 26.0–34.0)
MCHC: 31.2 g/dL (ref 30.0–36.0)
MCV: 79.7 fL — ABNORMAL LOW (ref 80.0–100.0)
Platelets: 264 10*3/uL (ref 150–400)
RBC: 3.54 MIL/uL — ABNORMAL LOW (ref 3.87–5.11)
RDW: 17.7 % — ABNORMAL HIGH (ref 11.5–15.5)
WBC: 6.9 10*3/uL (ref 4.0–10.5)
nRBC: 0 % (ref 0.0–0.2)

## 2020-12-29 LAB — GLUCOSE, CAPILLARY
Glucose-Capillary: 104 mg/dL — ABNORMAL HIGH (ref 70–99)
Glucose-Capillary: 114 mg/dL — ABNORMAL HIGH (ref 70–99)
Glucose-Capillary: 142 mg/dL — ABNORMAL HIGH (ref 70–99)
Glucose-Capillary: 124 mg/dL — ABNORMAL HIGH (ref 70–99)

## 2020-12-29 NOTE — Progress Notes (Signed)
PROGRESS NOTE    Jacqueline [facility]  YKD:[id number] DOB: [birth year] DOA: 12/18/2020 PCP: Jacqueline Limbo, MD   Chief Complaint  Patient presents with   leg wound    Brief Narrative:  56 old female with history of T2DM, HTN, HLD, CKD stage IV, gout, diverticulosis recently hospitalized 6/9-08/2012 for wound debridement and application of wound VAC on [facility] previously operated T10 fracture site, has been in [place] [facility] to for 2 months and ultimately discharged home few weeks prior to admission.  Patient states that while at [facility], she developed wound and ulcer in her right heel.On 9/27, patient presented to podiatry Dr. Pasty [facility] office for evaluation of worsening wound on the right heel.  Previously, she was tried on Keflex and doxycycline by her PCP without improvement.   In the podiatrist office, she was noted to have redness around the wound with drainage, full-thickness ulceration of the right heel.  There was [facility] concern of osteomyelitis of the right heel with possible venous insufficiency.  Patient was directed to ED at [facility].    In the ED, patient was afebrile, hemodynamically stable, labs remarkable for hyperkalemia AKI.  X-rays of right tibia-fibula and foot were obtained.  It did not show any evidence of osteomyelitis.  Admitted for further work-up and management at [facility] and was transferred to [facility] for vascular evaluation due to abnormal ABI.    Seen by vascular felt to be chronic PVD and venous ulceration.  Recommended bilateral unna boots and offloading, not thought to be Ayame Rena good revascularization candidate.  Follow up with [facility] in 1 month for wound check, if wound deteriorating, will consider angiography.    Patient was placed on IV antibiotic briefly and subsequently changed to p.o. and complete the course.  Will need skilled nursing for placement and will need outpatient wound care follow-up.  [facility] course complicated with acute metabolic encephalopathy  Awaiting SNF.  Assessment  & Plan:   Active Problems:   Leg wound, right   Diabetic foot (HCC)   Cellulitis of right lower leg   Venous insufficiency  Bilateral lower extremity ulceration and posterior heel ulceration with cellulitis  venous ulceration : ABI with noncompressible RLE artery - right TBI abnormal (absence of flow).  Left ABI with noncompressible LLE arteries, abnormal L TBI (severe small vessel disease).   Appreciate vascular and podiatry assistance Per vascular, venous disease mixed with PAD - wounds appear venous with right heel pressure ulcer Recommending bilateral UNNA boots, offloading.  Not Jac[facility] good revascularization candidate.  Plan for 1 month follow up, consider angiography if wounds deteriorating. Wound recs per vascular and podiatry Currently getting unna boots twice weekly, silver alginate to heel wound on right and bilateral unna boot applications MRI heel without osteo - mild tendinosis of distal achilles tendon, partial thickness cartilage loss of tibiotalar joint, mild OA of talonavicular joint, OA of naviculaar medial cuneiform joint S/p abx 9/27 - 10/2  Chronic appearing PAD based on ABI with absent flow on tibial arteries: Continue aspirin 81 follow with vascular.  Has statin intolerance.     Acute metabolic encephalopathy: Patient is much more alert awake oriented and interactive.  Continue supportive care PT OT.  MRI brain showed chronic small vessel ischemic disease, overall mentation much better continue PT OT and supportive care.   Of note she had recent fall during thoracentesis in IR in May-had eeg and mri brain and neuro eval that time. Thought may be it was  [name] panic attack   Falls/Generalized weakness  Hx of fall and back injusry T10 fracture in May 10 Recently hospitalized then to SNF for [name] month:. Continue PT OT, awaiting snf.   AKI on CKD stage IV Metabolic acidosis:  Baseline creat 2.19/16 peaked at 3.5, improved better than the baseline.  Encourage oral intake.Off IV  fluids.  Hyperkalemia: Potassium is stable;off Lokelma.    T2DM stable A1c 5.45/22: controlled. continue to hold home Januvia  Essential hypertension: BP fairly stable on atenolol and clonidine.     HLD-has statin intolerance   History of gout-Allopurinol on hold-resume on d/c.   Microcytic anemia  Likely from chronic kidney disease.  Hemoglobin stable.   Pressure Injury 09/02/20 Heel Right Deep Tissue Pressure Injury - Purple or maroon localized area of discolored intact skin or blood-filled blister due to damage of underlying soft tissue from pressure and/or shear. DTI Right Heel (Active)  09/02/20 2230  Location: Heel  Location Orientation: Right  Staging: Deep Tissue Pressure Injury - Purple or maroon localized area of discolored intact skin or blood-filled blister due to damage of underlying soft tissue from pressure and/or shear.  Wound Description (Comments): DTI Right Heel  Present on Admission: -- (Per patient, she has had for [name] while)     Pressure Injury 12/19/20 Vertebral column Mid Deep Tissue Pressure Injury - Purple or maroon localized area of discolored intact skin or blood-filled blister due to damage of underlying soft tissue from pressure and/or shear. (Active)  12/19/20 2254  Location: Vertebral column  Location Orientation: Mid  Staging: Deep Tissue Pressure Injury - Purple or maroon localized area of discolored intact skin or blood-filled blister due to damage of underlying soft tissue from pressure and/or shear.  Wound Description (Comments):   Present on Admission: Yes   DVT prophylaxis: heparin Code Status: full  Family Communication: none, husband did not answer - called back 10/5 Disposition:   Status is: Inpatient  Remains inpatient appropriate because:Inpatient level of care appropriate due to severity of illness  Dispo: The patient is from: Home              Anticipated d/c is to: SNF              Patient currently is not medically stable to  d/c.   Difficult to place patient No       Consultants:  Vascular Podiatry  Procedures:  ABI Summary:  Right: Resting right ankle-brachial index indicates noncompressible right  lower extremity arteries and monophasic waveforms. The right toe-brachial  index is abnormal (absence of flow).   Left: Resting left ankle-brachial index indicates noncompressible left  lower extremity arteries and monophasic waveforms. The left toe-brachial  index is abnormal (severe small vessel disease).   Antimicrobials:  Anti-infectives (From admission, onward)    Start     Dose/Rate Route Frequency Ordered Stop   12/21/20 1045  cephALEXin (KEFLEX) capsule 500 mg        500 mg Oral Every 12 hours 12/21/20 0954 12/23/20 2126   12/20/20 0730  vancomycin (VANCOCIN) IVPB 1000 mg/200 mL premix        1,000 mg 200 mL/hr over 60 Minutes Intravenous  Once 12/20/20 0637 12/20/20 1202   12/20/20 0730  piperacillin-tazobactam (ZOSYN) IVPB 2.25 g  Status:  Discontinued        2.25 g 100 mL/hr over 30 Minutes Intravenous Every 8 hours 12/20/20 0638 12/21/20 0854   12/19/20 1400  piperacillin-tazobactam (ZOSYN) IVPB 3.375 g  Status:  Discontinued  3.375 g 12.5 mL/hr over 240 Minutes Intravenous Every 8 hours 12/19/20 0715 12/19/20 0716   12/19/20 1400  piperacillin-tazobactam (ZOSYN) IVPB 3.375 g  Status:  Discontinued        3.375 g 100 mL/hr over 30 Minutes Intravenous Every 8 hours 12/19/20 0718 12/20/20 0638   12/18/20 2200  piperacillin-tazobactam (ZOSYN) IVPB 2.25 g  Status:  Discontinued        2.25 g 100 mL/hr over 30 Minutes Intravenous Every 8 hours 12/18/20 1842 12/19/20 0715   12/18/20 1555  vancomycin variable dose per unstable renal function (pharmacist dosing)  Status:  Discontinued         Does not apply See admin instructions 12/18/20 1555 12/21/20 0854   12/18/20 1500  vancomycin (VANCOCIN) IVPB 1000 mg/200 mL premix        1,000 mg 200 mL/hr over 60 Minutes Intravenous  Once  12/18/20 1458 12/18/20 1622   12/18/20 1400  piperacillin-tazobactam (ZOSYN) IVPB 3.375 g        3.375 g 100 mL/hr over 30 Minutes Intravenous  Once 12/18/20 1349 12/18/20 1510          Subjective: No new complaints  Objective: Vitals:   12/29/20 0431 12/29/20 0530 12/29/20 0820 12/29/20 1637  BP: (!) 141/58  (!) 161/63 (!) 134/49  Pulse: 72  65 69  Resp: 16  18 18   Temp: 97.8 F (36.6 C)  98 F (36.7 C) 97.9 F (36.6 C)  TempSrc: Oral  Oral Oral  SpO2: 100%  100% 100%  Weight:  65.2 kg    Height:        Intake/Output Summary (Last 24 hours) at 12/29/2020 1640 Last data filed at 12/29/2020 1500 Gross per 24 hour  Intake 900 ml  Output --  Net 900 ml   Filed Weights   12/26/20 2103 12/28/20 0501 12/29/20 0530  Weight: 66.6 kg 67.4 kg 65.2 kg    Examination:  General: No acute distress. Cardiovascular: RRR Lungs: unlabored Abdomen: Soft, nontender, nondistended  Neurological: Alert and oriented 3. Moves all extremities 4 . Cranial nerves II through XII grossly intact. Skin: Warm and dry. No rashes or lesions. Extremities: unna boots, prafo boots in place     Data Reviewed: I have personally reviewed following labs and imaging studies  CBC: Recent Labs  Lab 12/23/20 0158 12/24/20 0205 12/25/20 0816 12/27/20 0333 12/28/20 0327 12/29/20 0332  WBC 8.3 9.3  --  8.3 7.0 6.9  NEUTROABS  --   --   --  6.0 4.6  --   HGB 8.8* 8.9* 9.0* 9.1* 9.4* 8.8*  HCT 29.9* 29.5* 30.1* 30.1* 30.3* 28.2*  MCV 81.5 80.6  --  80.5 79.9* 79.7*  PLT 205 186  --  217 238 846    Basic Metabolic Panel: Recent Labs  Lab 12/23/20 0158 12/24/20 0205 12/27/20 0333 12/28/20 0327 12/29/20 0332  NA 140 137 137 137 136  K 3.8 3.7 4.1 4.2 4.4  CL 114* 110 110 110 110  CO2 20* 21* 20* 20* 20*  GLUCOSE 91 133* 116* 99 99  BUN 37* 29* 33* 33* 35*  CREATININE 1.53* 1.58* 1.38* 1.32* 1.34*  CALCIUM 8.8* 8.5* 9.1 9.2 9.1  MG  --   --  1.7 1.8  --   PHOS  --   --  3.6 3.5  --      GFR: Estimated Creatinine Clearance: 31.8 mL/min (Franklyn Cafaro) (by C-G formula based on SCr of 1.34 mg/dL (H)).  Liver Function Tests: Recent  Labs  Lab 12/27/20 0333 12/28/20 0327  AST 13* 15  ALT 10 10  ALKPHOS 105 108  BILITOT 0.4 0.4  PROT 6.0* 5.8*  ALBUMIN 1.9* 1.9*    CBG: Recent Labs  Lab 12/28/20 1649 12/28/20 2200 12/29/20 0548 12/29/20 1134 12/29/20 1638  GLUCAP 118* 104* 104* 114* 142*     Recent Results (from the past 240 hour(s))  Urine Culture     Status: None   Collection Time: 12/21/20 11:57 AM   Specimen: Urine, Clean Catch  Result Value Ref Range Status   Specimen Description URINE, CLEAN CATCH  Final   Special Requests NONE  Final   Culture   Final    NO GROWTH Performed at [facility], [place] [address], [place],  [address]    Report Status 12/22/2020 FINAL  Final         Radiology Studies: No results found.      Scheduled Meds:  aspirin EC  81 mg Oral Daily   atenolol  100 mg Oral Daily   cloNIDine  0.2 mg Oral BID   heparin  5,000 Units Subcutaneous Q8H   insulin aspart  0-5 Units Subcutaneous QHS   insulin aspart  0-9 Units Subcutaneous TID WC   liver oil-zinc oxide   Topical TID   senna  1 tablet Oral BID   Continuous Infusions:  sodium chloride Stopped (12/21/20 2036)     LOS: 11 days    Time spent: over 30 min    Fayrene Helper, MD [facility]   To contact the attending provider between 7A-7P or the covering provider during after hours 7P-7A, please log into the web site [email or web address] and access using universal [place] password for that web site. If you do not have the password, please call the [facility] operator.  12/29/2020, 4:40 PM

## 2020-12-29 NOTE — TOC Progression Note (Signed)
Transition of Care [facility]) - Progression Note    Patient Details  Name: Jacqueline [facility] MRN: [id number] Date of Birth: 03/03/1946  Transition [facility] CM/SW [place], [place] Phone Number: 12/29/2020, 4:14 PM  Clinical Narrative:    CSW spoke with pt's daughter who noted she had questions about next steps of care. CSW discussed the differences between LTC and ALF and the families responsibilities to secure that care. CSW also noted that pt is medically ready and we are unlikely to get any further bed offers. Daughter noted she has decided on [facility]. Facility notified, CSW unable to start [place], facility starting it. TOC will continue to follow for DC needs.   Expected Discharge Plan: [place] Barriers to Discharge: Continued Medical Work up, Ship broker  Expected Discharge Plan and Services Expected Discharge Plan: [place] arrangements for the past 2 months: Single Family Home                                       Social Determinants of Health (SDOH) Interventions    Readmission Risk Interventions No flowsheet data found.

## 2020-12-30 DIAGNOSIS — S81801D Unspecified open wound, right lower leg, subsequent encounter: Secondary | ICD-10-CM | POA: Diagnosis not present

## 2020-12-30 DIAGNOSIS — E875 Hyperkalemia: Secondary | ICD-10-CM | POA: Diagnosis not present

## 2020-12-30 DIAGNOSIS — E1151 Type 2 diabetes mellitus with diabetic peripheral angiopathy without gangrene: Secondary | ICD-10-CM | POA: Diagnosis not present

## 2020-12-30 LAB — GLUCOSE, CAPILLARY
Glucose-Capillary: 101 mg/dL — ABNORMAL HIGH (ref 70–99)
Glucose-Capillary: 87 mg/dL (ref 70–99)
Glucose-Capillary: 135 mg/dL — ABNORMAL HIGH (ref 70–99)
Glucose-Capillary: 117 mg/dL — ABNORMAL HIGH (ref 70–99)

## 2020-12-30 NOTE — Progress Notes (Signed)
Jacqueline [facility]  ELF:[id number] DOB: 04-Mar-1946 DOA: 12/18/2020 PCP: Bernerd Limbo, MD   Chief Complaint  Patient presents with   leg wound    Brief Narrative:  31 old female with history of T2DM, HTN, HLD, CKD stage IV, gout, diverticulosis recently hospitalized 6/9-08/2012 for wound debridement and application of wound VAC on Emsley Custer previously operated T10 fracture site, has been in [place] [facility] to for 2 months and ultimately discharged home few weeks prior to admission.  Patient states that while at [facility], she developed wound and ulcer in her right heel.On 9/27, patient presented to podiatry Dr. Pasty [facility] office for evaluation of worsening wound on the right heel.  Previously, she was tried on Keflex and doxycycline by her PCP without improvement.   In the podiatrist office, she was noted to have redness around the wound with drainage, full-thickness ulceration of the right heel.  There was Mattew Chriswell Christne Platts concern of osteomyelitis of the right heel with possible venous insufficiency.  Patient was directed to ED at [facility].    In the ED, patient was afebrile, hemodynamically stable, labs remarkable for hyperkalemia AKI.  X-rays of right tibia-fibula and foot were obtained.  It did not show any evidence of osteomyelitis.  Admitted for further work-up and management at [facility] and was transferred to [facility] for vascular evaluation due to abnormal ABI.    Seen by vascular felt to be chronic PVD and venous ulceration.  Recommended bilateral unna boots and offloading, not thought to be Latima Hamza good revascularization candidate.  Follow up with [facility] in 1 month for wound check, if wound deteriorating, will consider angiography.    Patient was placed on IV antibiotic briefly and subsequently changed to p.o. and complete the course.  Will need skilled nursing for placement and will need outpatient wound care follow-up.  Hospital course complicated with acute metabolic encephalopathy  Awaiting SNF.  Assessment  & Plan:   Active Problems:   Leg wound, right   Diabetic foot (HCC)   Cellulitis of right lower leg   Venous insufficiency  Bilateral lower extremity ulceration and posterior heel ulceration with cellulitis  venous ulceration : ABI with noncompressible RLE artery - right TBI abnormal (absence of flow).  Left ABI with noncompressible LLE arteries, abnormal L TBI (severe small vessel disease).   Appreciate vascular and podiatry assistance Per vascular, venous disease mixed with PAD - wounds appear venous with right heel pressure ulcer Recommending bilateral UNNA boots, offloading.  Not Mina Babula good revascularization candidate.  Plan for 1 month follow up, consider angiography if wounds deteriorating. Wound recs per vascular and podiatry Currently getting unna boots twice weekly, silver alginate to heel wound on right and bilateral unna boot applications MRI heel without osteo - mild tendinosis of distal achilles tendon, partial thickness cartilage loss of tibiotalar joint, mild OA of talonavicular joint, OA of naviculaar medial cuneiform joint S/p abx 9/27 - 10/2  Chronic appearing PAD based on ABI with absent flow on tibial arteries: Continue aspirin 81 follow with vascular.  Has statin intolerance.     Acute metabolic encephalopathy: Patient is much more alert awake oriented and interactive.  Continue supportive care PT OT.  MRI brain showed chronic small vessel ischemic disease, overall mentation much better continue PT OT and supportive care.   Of note she had recent fall during thoracentesis in IR in May-had eeg and mri brain and neuro eval that time. Thought may be it was  Labron Bloodgood panic attack   Falls/Generalized weakness  Hx of fall and back injusry T10 fracture in May 10 Recently hospitalized then to SNF for Authur Cubit month:. Continue PT OT, awaiting snf.   AKI on CKD stage IV Metabolic acidosis:  Baseline creat 2.19/16 peaked at 3.5, improved better than the baseline.  Encourage oral intake.Off IV  fluids.  Hyperkalemia: Potassium is stable;off Lokelma.    T2DM stable A1c 5.45/22: controlled. continue to hold home Januvia  Essential hypertension: BP fairly stable on atenolol and clonidine.     HLD-has statin intolerance   History of gout-Allopurinol on hold-resume on d/c.   Microcytic anemia  Likely from chronic kidney disease.  Hemoglobin stable.   Pressure Injury 09/02/20 Heel Right Deep Tissue Pressure Injury - Purple or maroon localized area of discolored intact skin or blood-filled blister due to damage of underlying soft tissue from pressure and/or shear. DTI Right Heel (Active)  09/02/20 2230  Location: Heel  Location Orientation: Right  Staging: Deep Tissue Pressure Injury - Purple or maroon localized area of discolored intact skin or blood-filled blister due to damage of underlying soft tissue from pressure and/or shear.  Wound Description (Comments): DTI Right Heel  Present on Admission: -- (Per patient, she has had for Anil Havard while)     Pressure Injury 12/19/20 Vertebral column Mid Deep Tissue Pressure Injury - Purple or maroon localized area of discolored intact skin or blood-filled blister due to damage of underlying soft tissue from pressure and/or shear. (Active)  12/19/20 2254  Location: Vertebral column  Location Orientation: Mid  Staging: Deep Tissue Pressure Injury - Purple or maroon localized area of discolored intact skin or blood-filled blister due to damage of underlying soft tissue from pressure and/or shear.  Wound Description (Comments):   Present on Admission: Yes   DVT prophylaxis: heparin Code Status: full  Family Communication: none, husband did not answer - called back 10/5 Disposition:   Status is: Inpatient  Remains inpatient appropriate because:Inpatient level of care appropriate due to severity of illness  Dispo: The patient is from: Home              Anticipated d/c is to: SNF              Patient currently is not medically stable to  d/c.   Difficult to place patient No       Consultants:  Vascular Podiatry  Procedures:  ABI Summary:  Right: Resting right ankle-brachial index indicates noncompressible right  lower extremity arteries and monophasic waveforms. The right toe-brachial  index is abnormal (absence of flow).   Left: Resting left ankle-brachial index indicates noncompressible left  lower extremity arteries and monophasic waveforms. The left toe-brachial  index is abnormal (severe small vessel disease).   Antimicrobials:  Anti-infectives (From admission, onward)    Start     Dose/Rate Route Frequency Ordered Stop   12/21/20 1045  cephALEXin (KEFLEX) capsule 500 mg        500 mg Oral Every 12 hours 12/21/20 0954 12/23/20 2126   12/20/20 0730  vancomycin (VANCOCIN) IVPB 1000 mg/200 mL premix        1,000 mg 200 mL/hr over 60 Minutes Intravenous  Once 12/20/20 0637 12/20/20 1202   12/20/20 0730  piperacillin-tazobactam (ZOSYN) IVPB 2.25 g  Status:  Discontinued        2.25 g 100 mL/hr over 30 Minutes Intravenous Every 8 hours 12/20/20 0638 12/21/20 0854   12/19/20 1400  piperacillin-tazobactam (ZOSYN) IVPB 3.375 g  Status:  Discontinued  3.375 g 12.5 mL/hr over 240 Minutes Intravenous Every 8 hours 12/19/20 0715 12/19/20 0716   12/19/20 1400  piperacillin-tazobactam (ZOSYN) IVPB 3.375 g  Status:  Discontinued        3.375 g 100 mL/hr over 30 Minutes Intravenous Every 8 hours 12/19/20 0718 12/20/20 0638   12/18/20 2200  piperacillin-tazobactam (ZOSYN) IVPB 2.25 g  Status:  Discontinued        2.25 g 100 mL/hr over 30 Minutes Intravenous Every 8 hours 12/18/20 1842 12/19/20 0715   12/18/20 1555  vancomycin variable dose per unstable renal function (pharmacist dosing)  Status:  Discontinued         Does not apply See admin instructions 12/18/20 1555 12/21/20 0854   12/18/20 1500  vancomycin (VANCOCIN) IVPB 1000 mg/200 mL premix        1,000 mg 200 mL/hr over 60 Minutes Intravenous  Once  12/18/20 1458 12/18/20 1622   12/18/20 1400  piperacillin-tazobactam (ZOSYN) IVPB 3.375 g        3.375 g 100 mL/hr over 30 Minutes Intravenous  Once 12/18/20 1349 12/18/20 1510          Subjective: No complaints  Objective: Vitals:   12/29/20 2100 12/30/20 0620 12/30/20 1042 12/30/20 1634  BP: (!) 153/60 (!) 162/62 (!) 148/60 (!) 152/66  Pulse: 72 66 62 66  Resp: 15 16 18 16   Temp: 98.7 F (37.1 C)  98 F (36.7 C) 98.6 F (37 C)  TempSrc: Oral   Oral  SpO2: 98% 99% 99% 98%  Weight:      Height:        Intake/Output Summary (Last 24 hours) at 12/30/2020 1805 Last data filed at 12/30/2020 1741 Gross per 24 hour  Intake 700 ml  Output 0 ml  Net 700 ml   Filed Weights   12/26/20 2103 12/28/20 0501 12/29/20 0530  Weight: 66.6 kg 67.4 kg 65.2 kg    Examination:  General: No acute distress. Cardiovascular: RRR Lungs: unlabored Abdomen: Soft, nontender, nondistended  Neurological: Alert and oriented 3. Moves all extremities 4. Cranial nerves II through XII grossly intact. Skin: Warm and dry. No rashes or lesions. Extremities: unna boots, prafo      Data Reviewed: I have personally reviewed following labs and imaging studies  CBC: Recent Labs  Lab 12/24/20 0205 12/25/20 0816 12/27/20 0333 12/28/20 0327 12/29/20 0332  WBC 9.3  --  8.3 7.0 6.9  NEUTROABS  --   --  6.0 4.6  --   HGB 8.9* 9.0* 9.1* 9.4* 8.8*  HCT 29.5* 30.1* 30.1* 30.3* 28.2*  MCV 80.6  --  80.5 79.9* 79.7*  PLT 186  --  217 238 941    Basic Metabolic Panel: Recent Labs  Lab 12/24/20 0205 12/27/20 0333 12/28/20 0327 12/29/20 0332  NA 137 137 137 136  K 3.7 4.1 4.2 4.4  CL 110 110 110 110  CO2 21* 20* 20* 20*  GLUCOSE 133* 116* 99 99  BUN 29* 33* 33* 35*  CREATININE 1.58* 1.38* 1.32* 1.34*  CALCIUM 8.5* 9.1 9.2 9.1  MG  --  1.7 1.8  --   PHOS  --  3.6 3.5  --     GFR: Estimated Creatinine Clearance: 31.8 mL/min (Alysandra Lobue) (by C-G formula based on SCr of 1.34 mg/dL (H)).  Liver  Function Tests: Recent Labs  Lab 12/27/20 0333 12/28/20 0327  AST 13* 15  ALT 10 10  ALKPHOS 105 108  BILITOT 0.4 0.4  PROT 6.0* 5.8*  ALBUMIN  1.9* 1.9*    CBG: Recent Labs  Lab 12/29/20 1638 12/29/20 2029 12/30/20 0641 12/30/20 1129 12/30/20 1636  GLUCAP 142* 124* 101* 87 135*     Recent Results (from the past 240 hour(s))  Urine Culture     Status: None   Collection Time: 12/21/20 11:57 AM   Specimen: Urine, Clean Catch  Result Value Ref Range Status   Specimen Description URINE, CLEAN CATCH  Final   Special Requests NONE  Final   Culture   Final    NO GROWTH Performed at [facility], [place] [address], [place], [place] [address]    Report Status 12/22/2020 FINAL  Final         Radiology Studies: No results found.      Scheduled Meds:  aspirin EC  81 mg Oral Daily   atenolol  100 mg Oral Daily   cloNIDine  0.2 mg Oral BID   heparin  5,000 Units Subcutaneous Q8H   insulin aspart  0-5 Units Subcutaneous QHS   insulin aspart  0-9 Units Subcutaneous TID WC   liver oil-zinc oxide   Topical TID   senna  1 tablet Oral BID   Continuous Infusions:  sodium chloride Stopped (12/21/20 2036)     LOS: 12 days    Time spent: over 30 min    Fayrene Helper, MD [facility]   To contact the attending provider between 7A-7P or the covering provider during after hours 7P-7A, please log into the web site [email or web address] and access using universal [place] password for that web site. If you do not have the password, please call the hospital operator.  12/30/2020, 6:05 PM

## 2020-12-31 DIAGNOSIS — E875 Hyperkalemia: Secondary | ICD-10-CM | POA: Diagnosis not present

## 2020-12-31 DIAGNOSIS — S81801D Unspecified open wound, right lower leg, subsequent encounter: Secondary | ICD-10-CM | POA: Diagnosis not present

## 2020-12-31 DIAGNOSIS — E1151 Type 2 diabetes mellitus with diabetic peripheral angiopathy without gangrene: Secondary | ICD-10-CM | POA: Diagnosis not present

## 2020-12-31 LAB — BASIC METABOLIC PANEL
Anion gap: 7 (ref 5–15)
BUN: 31 mg/dL — ABNORMAL HIGH (ref 8–23)
CO2: 21 mmol/L — ABNORMAL LOW (ref 22–32)
Calcium: 9.3 mg/dL (ref 8.9–10.3)
Chloride: 107 mmol/L (ref 98–111)
Creatinine, Ser: 1.28 mg/dL — ABNORMAL HIGH (ref 0.44–1.00)
GFR, Estimated: 44 mL/min — ABNORMAL LOW (ref >60)
Glucose, Bld: 119 mg/dL — ABNORMAL HIGH (ref 70–99)
Potassium: 4.1 mmol/L (ref 3.5–5.1)
Sodium: 135 mmol/L (ref 135–145)

## 2020-12-31 LAB — CBC
HCT: 30.5 % — ABNORMAL LOW (ref 36.0–46.0)
Hemoglobin: 9.6 g/dL — ABNORMAL LOW (ref 12.0–15.0)
MCH: 24.9 pg — ABNORMAL LOW (ref 26.0–34.0)
MCHC: 31.5 g/dL (ref 30.0–36.0)
MCV: 79.2 fL — ABNORMAL LOW (ref 80.0–100.0)
Platelets: 255 10*3/uL (ref 150–400)
RBC: 3.85 MIL/uL — ABNORMAL LOW (ref 3.87–5.11)
RDW: 17.9 % — ABNORMAL HIGH (ref 11.5–15.5)
WBC: 6.3 10*3/uL (ref 4.0–10.5)
nRBC: 0 % (ref 0.0–0.2)

## 2020-12-31 LAB — GLUCOSE, CAPILLARY
Glucose-Capillary: 103 mg/dL — ABNORMAL HIGH (ref 70–99)
Glucose-Capillary: 100 mg/dL — ABNORMAL HIGH (ref 70–99)
Glucose-Capillary: 123 mg/dL — ABNORMAL HIGH (ref 70–99)
Glucose-Capillary: 95 mg/dL (ref 70–99)

## 2020-12-31 MED ORDER — INFLUENZA VAC A&B SA ADJ QUAD 0.5 ML IM PRSY
0.5000 mL | PREFILLED_SYRINGE | INTRAMUSCULAR | Status: AC
  Administered 2021-01-01: 0.5 mL via INTRAMUSCULAR
  Filled 2020-12-31: qty 0.5

## 2020-12-31 NOTE — Progress Notes (Signed)
Physical Therapy Treatment Patient Details Name: Jacqueline Richmond MRN: [id number] DOB: [birth year] Today's Date: 12/31/2020   History of Present Illness 75 y.o. female presents to [facility] hospital on 12/18/2020 from podiatry with worsening R heel wound. PMH includes T2DM, HTN, HLD, CKD stage IV, gout, diverticulosis recently hospitalized 6/9-08/2012 for wound debridement and application of wound VAC on a previously operated T10 fracture site, discharged to SNF for 2 months prior to returning home.    PT Comments    Pt progressing towards her physical therapy goals. Utilized Denna Haggard to transfer out of bed to the chair with RN assist. Pt with nausea/vomiting after mobilizing; RN to provide medication. Pt continues with generalized weakness, balance deficits, debility, decreased cognition. Continue to recommend SNF for ongoing Physical Therapy.      Recommendations for follow up therapy are one component of a multi-disciplinary discharge planning process, led by the attending physician.  Recommendations may be updated based on patient status, additional functional criteria and insurance authorization.  Follow Up Recommendations  SNF     Equipment Recommendations   (defer to post-acute)    Recommendations for Other Services       Precautions / Restrictions Precautions Precautions: Fall Restrictions Weight Bearing Restrictions: No     Mobility  Bed Mobility Overal bed mobility: Needs Assistance Bed Mobility: Supine to Sit     Supine to sit: Mod assist     General bed mobility comments: Pt initiating moving BLE's to edge of bed, assist to execute, cues to push through rail and provided trunk elevation to upright    Transfers Overall transfer level: Needs assistance Equipment used: Ambulation equipment used Transfers: Sit to/from Stand Sit to Stand: Mod assist;+2 physical assistance         General transfer comment: ModA + 2 to stand with use of Stedy and transferred to  chair  Ambulation/Gait             General Gait Details: unable   Stairs             Wheelchair Mobility    Modified Rankin (Stroke Patients Only)       Balance Overall balance assessment: Needs assistance Sitting-balance support: Bilateral upper extremity supported;Feet supported Sitting balance-Leahy Scale: Fair                                      Cognition Arousal/Alertness: Awake/alert Behavior During Therapy: WFL for tasks assessed/performed Overall Cognitive Status: Impaired/Different from baseline Area of Impairment: Following commands;Problem solving                       Following Commands: Follows one step commands inconsistently     Problem Solving: Slow processing;Requires verbal cues;Decreased initiation General Comments: multimodal cues for problem solving tasks      Exercises General Exercises - Lower Extremity Ankle Circles/Pumps: Both;10 reps;Supine Quad Sets: Both;10 reps;Supine    General Comments        Pertinent Vitals/Pain Pain Assessment: Faces Faces Pain Scale: Hurts even more Pain Location: BLE, back Pain Descriptors / Indicators: Grimacing Pain Intervention(s): Monitored during session;Limited activity within patient's tolerance    Home Living                      Prior Function            PT Goals (current goals can now be found in  the care plan section) Acute Rehab PT Goals Patient Stated Goal: to improve mobility and reduce falls risk Potential to Achieve Goals: Fair Progress towards PT goals: Progressing toward goals    Frequency    Min 2X/week      PT Plan Current plan remains appropriate    Co-evaluation              AM-PAC PT "6 Clicks" Mobility   Outcome Measure  Help needed turning from your back to your side while in a flat bed without using bedrails?: A Lot Help needed moving from lying on your back to sitting on the side of a flat bed without using  bedrails?: A Lot Help needed moving to and from a bed to a chair (including a wheelchair)?: A Lot Help needed standing up from a chair using your arms (e.g., wheelchair or bedside chair)?: Total   Help needed climbing 3-5 steps with a railing? : Total 6 Click Score: 8    End of Session Equipment Utilized During Treatment: Gait belt Activity Tolerance: Other (comment) (N/V towards end of sssion) Patient left: with call bell/phone within reach;in chair;with chair alarm set Nurse Communication: Mobility status PT Visit Diagnosis: Other abnormalities of gait and mobility (R26.89);Muscle weakness (generalized) (M62.81);Pain Pain - Right/Left:  (bilateral LEs) Pain - part of body: Leg     Time: 5366-4403 PT Time Calculation (min) (ACUTE ONLY): 32 min  Charges:  $Therapeutic Activity: 23-37 mins                     Wyona Almas, PT, DPT Acute Rehabilitation Services Pager [phone] Office [phone]    Deno Etienne 12/31/2020, 3:28 PM

## 2020-12-31 NOTE — Progress Notes (Signed)
Orthopedic Tech Progress Note Patient Details:  Jacqueline Richmond [birth year] [id number]  Ortho Devices Type of Ortho Device: Louretta Parma boot Ortho Device/Splint Location: bi-lateral Ortho Device/Splint Interventions: Ordered, Application, Adjustment  Kyra and I applied the unna boots Post Interventions Patient Tolerated: Well Instructions Provided: Care of device, Adjustment of device  [name] 12/31/2020, 8:56 PM

## 2020-12-31 NOTE — Progress Notes (Signed)
PROGRESS NOTE    Jacqueline Richmond  GLO:[id number] DOB: December 05, 1945 DOA: 12/18/2020 PCP: Jacqueline Limbo, MD   Chief Complaint  Patient presents with   leg wound    Brief Narrative:  18 old female with history of T2DM, HTN, HLD, CKD stage IV, gout, diverticulosis recently hospitalized 6/9-08/2012 for wound debridement and application of wound VAC on Jacqueline Richmond previously operated T10 fracture site, has been in [place] [facility] to for 2 months and ultimately discharged home few weeks prior to admission.  Patient states that while at [facility], she developed wound and ulcer in her right heel.On 9/27, patient presented to podiatry Dr. Pasty Richmond office for evaluation of worsening wound on the right heel.  Previously, she was tried on Keflex and doxycycline by her PCP without improvement.   In the podiatrist office, she was noted to have redness around the wound with drainage, full-thickness ulceration of the right heel.  There was Josiyah Tozzi Jacqueline Richmond concern of osteomyelitis of the right heel with possible venous insufficiency.  Patient was directed to ED at [facility].    In the ED, patient was afebrile, hemodynamically stable, labs remarkable for hyperkalemia AKI.  X-rays of right tibia-fibula and foot were obtained.  It did not show any evidence of osteomyelitis.  Admitted for further work-up and management at [facility] and was transferred to [facility] [place] for vascular evaluation due to abnormal ABI.    Seen by vascular felt to be chronic PVD and venous ulceration.  Recommended bilateral unna boots and offloading, not thought to be Jacqueline Richmond good revascularization candidate.  Follow up with [facility] in 1 month for wound check, if wound deteriorating, will consider angiography.    Patient was placed on IV antibiotic briefly and subsequently changed to p.o. and complete the course.  Will need skilled nursing for placement and will need outpatient wound care follow-up.  Hospital course complicated with acute metabolic encephalopathy  Awaiting SNF.  Assessment  & Plan:   Active Problems:   Leg wound, right   Diabetic foot (HCC)   Cellulitis of right lower leg   Venous insufficiency  Awaiting placement  Bilateral lower extremity ulceration and posterior heel ulceration with cellulitis  venous ulceration : ABI with noncompressible RLE artery - right TBI abnormal (absence of flow).  Left ABI with noncompressible LLE arteries, abnormal L TBI (severe small vessel disease).   Appreciate vascular and podiatry assistance Per vascular, venous disease mixed with PAD - wounds appear venous with right heel pressure ulcer Recommending bilateral UNNA boots, offloading.  Not Jacqueline Richmond good revascularization candidate.  Plan for 1 month follow up, consider angiography if wounds deteriorating. Wound recs per vascular and podiatry Currently getting unna boots twice weekly, silver alginate to heel wound on right and bilateral unna boot applications MRI heel without osteo - mild tendinosis of distal achilles tendon, partial thickness cartilage loss of tibiotalar joint, mild OA of talonavicular joint, OA of naviculaar medial cuneiform joint S/p abx 9/27 - 10/2  Chronic appearing PAD based on ABI with absent flow on tibial arteries: Continue aspirin 81 follow with vascular.  Has statin intolerance.     Acute metabolic encephalopathy: Patient is much more alert awake oriented and interactive.  Continue supportive care PT OT.  MRI brain showed chronic small vessel ischemic disease, overall mentation much better continue PT OT and supportive care.   Of note she had recent fall during thoracentesis in IR in May-had eeg and mri brain and neuro eval that time. Thought may be it was  Jacqueline Richmond panic attack  Falls/Generalized weakness Hx of fall and back injusry T10 fracture in May 10 Recently hospitalized then to SNF for Jacqueline Richmond month:. Continue PT OT, awaiting snf.   AKI on CKD stage IV Metabolic acidosis:  Baseline creat 2.19/16 peaked at 3.5, improved better than the baseline.   Encourage oral intake.Off IV fluids.  Hyperkalemia: Potassium is stable;off Lokelma.    T2DM stable A1c 5.45/22: controlled. continue to hold home Januvia  Essential hypertension: BP fairly stable on atenolol and clonidine.     HLD-has statin intolerance   History of gout-Allopurinol on hold-resume on d/c.   Microcytic anemia  Likely from chronic kidney disease.  Hemoglobin stable.   Pressure Injury 09/02/20 Heel Right Deep Tissue Pressure Injury - Purple or maroon localized area of discolored intact skin or blood-filled blister due to damage of underlying soft tissue from pressure and/or shear. DTI Right Heel (Active)  09/02/20 2230  Location: Heel  Location Orientation: Right  Staging: Deep Tissue Pressure Injury - Purple or maroon localized area of discolored intact skin or blood-filled blister due to damage of underlying soft tissue from pressure and/or shear.  Wound Description (Comments): DTI Right Heel  Present on Admission: -- (Per patient, she has had for Revis Whalin while)     Pressure Injury 12/19/20 Vertebral column Mid Deep Tissue Pressure Injury - Purple or maroon localized area of discolored intact skin or blood-filled blister due to damage of underlying soft tissue from pressure and/or shear. (Active)  12/19/20 2254  Location: Vertebral column  Location Orientation: Mid  Staging: Deep Tissue Pressure Injury - Purple or maroon localized area of discolored intact skin or blood-filled blister due to damage of underlying soft tissue from pressure and/or shear.  Wound Description (Comments):   Present on Admission: Yes   DVT prophylaxis: heparin Code Status: full  Family Communication: none, husband did not answer - called back 10/5 Disposition:   Status is: Inpatient  Remains inpatient appropriate because:Inpatient level of care appropriate due to severity of illness  Dispo: The patient is from: Home              Anticipated d/c is to: SNF              Patient currently is  not medically stable to d/c.   Difficult to place patient No       Consultants:  Vascular Podiatry  Procedures:  ABI Summary:  Right: Resting right ankle-brachial index indicates noncompressible right  lower extremity arteries and monophasic waveforms. The right toe-brachial  index is abnormal (absence of flow).   Left: Resting left ankle-brachial index indicates noncompressible left  lower extremity arteries and monophasic waveforms. The left toe-brachial  index is abnormal (severe small vessel disease).   Antimicrobials:  Anti-infectives (From admission, onward)    Start     Dose/Rate Route Frequency Ordered Stop   12/21/20 1045  cephALEXin (KEFLEX) capsule 500 mg        500 mg Oral Every 12 hours 12/21/20 0954 12/23/20 2126   12/20/20 0730  vancomycin (VANCOCIN) IVPB 1000 mg/200 mL premix        1,000 mg 200 mL/hr over 60 Minutes Intravenous  Once 12/20/20 0637 12/20/20 1202   12/20/20 0730  piperacillin-tazobactam (ZOSYN) IVPB 2.25 g  Status:  Discontinued        2.25 g 100 mL/hr over 30 Minutes Intravenous Every 8 hours 12/20/20 0638 12/21/20 0854   12/19/20 1400  piperacillin-tazobactam (ZOSYN) IVPB 3.375 g  Status:  Discontinued  3.375 g 12.5 mL/hr over 240 Minutes Intravenous Every 8 hours 12/19/20 0715 12/19/20 0716   12/19/20 1400  piperacillin-tazobactam (ZOSYN) IVPB 3.375 g  Status:  Discontinued        3.375 g 100 mL/hr over 30 Minutes Intravenous Every 8 hours 12/19/20 0718 12/20/20 0638   12/18/20 2200  piperacillin-tazobactam (ZOSYN) IVPB 2.25 g  Status:  Discontinued        2.25 g 100 mL/hr over 30 Minutes Intravenous Every 8 hours 12/18/20 1842 12/19/20 0715   12/18/20 1555  vancomycin variable dose per unstable renal function (pharmacist dosing)  Status:  Discontinued         Does not apply See admin instructions 12/18/20 1555 12/21/20 0854   12/18/20 1500  vancomycin (VANCOCIN) IVPB 1000 mg/200 mL premix        1,000 mg 200 mL/hr over 60  Minutes Intravenous  Once 12/18/20 1458 12/18/20 1622   12/18/20 1400  piperacillin-tazobactam (ZOSYN) IVPB 3.375 g        3.375 g 100 mL/hr over 30 Minutes Intravenous  Once 12/18/20 1349 12/18/20 1510          Subjective: No complaints  Objective: Vitals:   12/30/20 2108 12/31/20 0520 12/31/20 0946 12/31/20 1646  BP: (!) 156/68 (!) 142/60 (!) 153/61 (!) 145/62  Pulse: 74 65 68 62  Resp: 18 17 18 18   Temp: 98.3 F (36.8 C) 98.4 F (36.9 C) 99.9 F (37.7 C) 97.6 F (36.4 C)  TempSrc:  Oral Oral Oral  SpO2: 98% 98% 99% 100%  Weight:      Height:        Intake/Output Summary (Last 24 hours) at 12/31/2020 1818 Last data filed at 12/31/2020 1330 Gross per 24 hour  Intake 720 ml  Output 0 ml  Net 720 ml   Filed Weights   12/26/20 2103 12/28/20 0501 12/29/20 0530  Weight: 66.6 kg 67.4 kg 65.2 kg    Examination:  General: No acute distress. Cardiovascular: RRR Lungs: unlabored Abdomen: Soft, nontender, nondistended  Neurological: Alert and oriented 3. Moves all extremities 4. Cranial nerves II through XII grossly intact. Skin: Warm and dry. No rashes or lesions. Extremities: unna boots, prafo  Data Reviewed: I have personally reviewed following labs and imaging studies  CBC: Recent Labs  Lab 12/25/20 0816 12/27/20 0333 12/28/20 0327 12/29/20 0332 12/31/20 0403  WBC  --  8.3 7.0 6.9 6.3  NEUTROABS  --  6.0 4.6  --   --   HGB 9.0* 9.1* 9.4* 8.8* 9.6*  HCT 30.1* 30.1* 30.3* 28.2* 30.5*  MCV  --  80.5 79.9* 79.7* 79.2*  PLT  --  217 238 264 329    Basic Metabolic Panel: Recent Labs  Lab 12/27/20 0333 12/28/20 0327 12/29/20 0332 12/31/20 0403  NA 137 137 136 135  K 4.1 4.2 4.4 4.1  CL 110 110 110 107  CO2 20* 20* 20* 21*  GLUCOSE 116* 99 99 119*  BUN 33* 33* 35* 31*  CREATININE 1.38* 1.32* 1.34* 1.28*  CALCIUM 9.1 9.2 9.1 9.3  MG 1.7 1.8  --   --   PHOS 3.6 3.5  --   --     GFR: Estimated Creatinine Clearance: 33.3 mL/min (Genni Buske) (by C-G  formula based on SCr of 1.28 mg/dL (H)).  Liver Function Tests: Recent Labs  Lab 12/27/20 0333 12/28/20 0327  AST 13* 15  ALT 10 10  ALKPHOS 105 108  BILITOT 0.4 0.4  PROT 6.0* 5.8*  ALBUMIN 1.9*  1.9*    CBG: Recent Labs  Lab 12/30/20 1636 12/30/20 2110 12/31/20 0637 12/31/20 1150 12/31/20 1645  GLUCAP 135* 117* 103* 100* 123*     No results found for this or any previous visit (from the past 240 hour(s)).        Radiology Studies: No results found.      Scheduled Meds:  aspirin EC  81 mg Oral Daily   atenolol  100 mg Oral Daily   cloNIDine  0.2 mg Oral BID   heparin  5,000 Units Subcutaneous Q8H   [START ON 01/01/2021] influenza vaccine adjuvanted  0.5 mL Intramuscular Tomorrow-1000   insulin aspart  0-5 Units Subcutaneous QHS   insulin aspart  0-9 Units Subcutaneous TID WC   liver oil-zinc oxide   Topical TID   senna  1 tablet Oral BID   Continuous Infusions:  sodium chloride Stopped (12/21/20 2036)     LOS: 13 days    Time spent: over 30 min    Fayrene Helper, MD [facility]   To contact the attending provider between 7A-7P or the covering provider during after hours 7P-7A, please log into the web site [email or web address] and access using universal [employer] password for that web site. If you do not have the password, please call the hospital operator.  12/31/2020, 6:18 PM

## 2021-01-01 DIAGNOSIS — E875 Hyperkalemia: Secondary | ICD-10-CM | POA: Diagnosis not present

## 2021-01-01 DIAGNOSIS — E1151 Type 2 diabetes mellitus with diabetic peripheral angiopathy without gangrene: Secondary | ICD-10-CM | POA: Diagnosis not present

## 2021-01-01 DIAGNOSIS — S81801D Unspecified open wound, right lower leg, subsequent encounter: Secondary | ICD-10-CM | POA: Diagnosis not present

## 2021-01-01 LAB — GLUCOSE, CAPILLARY
Glucose-Capillary: 101 mg/dL — ABNORMAL HIGH (ref 70–99)
Glucose-Capillary: 107 mg/dL — ABNORMAL HIGH (ref 70–99)
Glucose-Capillary: 129 mg/dL — ABNORMAL HIGH (ref 70–99)
Glucose-Capillary: 119 mg/dL — ABNORMAL HIGH (ref 70–99)

## 2021-01-01 LAB — CBC
HCT: 30.9 % — ABNORMAL LOW (ref 36.0–46.0)
Hemoglobin: 9.8 g/dL — ABNORMAL LOW (ref 12.0–15.0)
MCH: 25.3 pg — ABNORMAL LOW (ref 26.0–34.0)
MCHC: 31.7 g/dL (ref 30.0–36.0)
MCV: 79.8 fL — ABNORMAL LOW (ref 80.0–100.0)
Platelets: 289 10*3/uL (ref 150–400)
RBC: 3.87 MIL/uL (ref 3.87–5.11)
RDW: 18.1 % — ABNORMAL HIGH (ref 11.5–15.5)
WBC: 6.4 10*3/uL (ref 4.0–10.5)
nRBC: 0 % (ref 0.0–0.2)

## 2021-01-01 LAB — BASIC METABOLIC PANEL
Anion gap: 6 (ref 5–15)
BUN: 31 mg/dL — ABNORMAL HIGH (ref 8–23)
CO2: 21 mmol/L — ABNORMAL LOW (ref 22–32)
Calcium: 9.4 mg/dL (ref 8.9–10.3)
Chloride: 110 mmol/L (ref 98–111)
Creatinine, Ser: 1.31 mg/dL — ABNORMAL HIGH (ref 0.44–1.00)
GFR, Estimated: 43 mL/min — ABNORMAL LOW (ref >60)
Glucose, Bld: 89 mg/dL (ref 70–99)
Potassium: 4.1 mmol/L (ref 3.5–5.1)
Sodium: 137 mmol/L (ref 135–145)

## 2021-01-01 LAB — MAGNESIUM: Magnesium: 1.7 mg/dL (ref 1.7–2.4)

## 2021-01-01 LAB — PHOSPHORUS: Phosphorus: 3.7 mg/dL (ref 2.5–4.6)

## 2021-01-01 MED ORDER — LACTATED RINGERS IV BOLUS
500.0000 mL | Freq: Once | INTRAVENOUS | Status: AC
  Administered 2021-01-01: 500 mL via INTRAVENOUS

## 2021-01-01 MED ORDER — CLONIDINE HCL 0.1 MG PO TABS
0.1000 mg | ORAL_TABLET | Freq: Two times a day (BID) | ORAL | Status: DC
  Administered 2021-01-01 – 2021-01-03 (×4): 0.1 mg via ORAL
  Filled 2021-01-01 (×4): qty 1

## 2021-01-01 NOTE — Progress Notes (Signed)
PROGRESS NOTE    Jacqueline Richmond  KWI:[id number] DOB: 12-05-1945 DOA: 12/18/2020 PCP: Bernerd Limbo, MD   Chief Complaint  Patient presents with   leg wound    Brief Narrative:  71 old female with history of T2DM, HTN, HLD, CKD stage IV, gout, diverticulosis recently hospitalized 6/9-08/2012 for wound debridement and application of wound VAC on Tukker Byrns previously operated T10 fracture site, has been in [place] [facility] to for 2 months and ultimately discharged home few weeks prior to admission.  Patient states that while at [facility], she developed wound and ulcer in her right heel.On 9/27, patient presented to podiatry Dr. Pasty Arch office for evaluation of worsening wound on the right heel.  Previously, she was tried on Keflex and doxycycline by her PCP without improvement.   In the podiatrist office, she was noted to have redness around the wound with drainage, full-thickness ulceration of the right heel.  There was Starkisha Tullis Bulah Lurie concern of osteomyelitis of the right heel with possible venous insufficiency.  Patient was directed to ED at [facility]    In the ED, patient was afebrile, hemodynamically stable, labs remarkable for hyperkalemia AKI.  X-rays of right tibia-fibula and foot were obtained.  It did not show any evidence of osteomyelitis.  Admitted for further work-up and management at [facility] and was transferred to [facility] for vascular evaluation due to abnormal ABI.    Seen by vascular felt to be chronic PVD and venous ulceration.  Recommended bilateral unna boots and offloading, not thought to be Onelia Cadmus good revascularization candidate.  Follow up with VVS in 1 month for wound check, if wound deteriorating, will consider angiography.    Patient was placed on IV antibiotic briefly and subsequently changed to p.o. and complete the course.  Will need skilled nursing for placement and will need outpatient wound care follow-up.  Hospital course complicated with acute metabolic encephalopathy  Awaiting SNF at this point.   Of note, orthostasis working with therapy past 2 days.   Assessment & Plan:   Active Problems:   Leg wound, right   Diabetic foot (HCC)   Cellulitis of right lower leg   Venous insufficiency  Awaiting placement  Orthostasis Symptomatic with therapy past few days, follow with IVF Taper down clonidine and follow   Bilateral lower extremity ulceration and posterior heel ulceration with cellulitis  venous ulceration : ABI with noncompressible RLE artery - right TBI abnormal (absence of flow).  Left ABI with noncompressible LLE arteries, abnormal L TBI (severe small vessel disease).   Appreciate vascular and podiatry assistance Per vascular, venous disease mixed with PAD - wounds appear venous with right heel pressure ulcer Recommending bilateral UNNA boots, offloading.  Not Elias Dennington good revascularization candidate.  Plan for 1 month follow up, consider angiography if wounds deteriorating. Wound recs per vascular and podiatry Currently getting unna boots twice weekly, silver alginate to heel wound on right and bilateral unna boot applications MRI heel without osteo - mild tendinosis of distal achilles tendon, partial thickness cartilage loss of tibiotalar joint, mild OA of talonavicular joint, OA of naviculaar medial cuneiform joint S/p abx 9/27 - 10/2  Chronic appearing PAD based on ABI with absent flow on tibial arteries: Continue aspirin 81 follow with vascular.  Has statin intolerance.     Acute metabolic encephalopathy: Patient is much more alert awake oriented and interactive.  Continue supportive care PT OT.  MRI brain showed chronic small vessel ischemic disease, overall mentation much better continue PT OT and supportive care.  Of note she had recent fall during thoracentesis in IR in May-had eeg and mri brain and neuro eval that time. Thought may be it was  Kessler Kopinski panic attack   Falls/Generalized weakness Hx of fall and back injusry T10 fracture in May 10 Recently hospitalized then to  SNF for Shanea Karney month:. Continue PT OT, awaiting snf.   AKI on CKD stage IV Metabolic acidosis:  Baseline creat 2.19/16 peaked at 3.5, improved better than the baseline.  Encourage oral intake.Off IV fluids.  Hyperkalemia: resolved  T2DM stable A1c 5.45/22: controlled. continue to hold home Januvia  Essential hypertension: BP fairly stable on atenolol and clonidine.   Taper clonidine with orthostasis above   HLD-has statin intolerance   History of gout-Allopurinol on hold-resume on d/c.   Microcytic anemia  Likely from chronic kidney disease.  Hemoglobin stable.   Pressure Injury 09/02/20 Heel Right Deep Tissue Pressure Injury - Purple or maroon localized area of discolored intact skin or blood-filled blister due to damage of underlying soft tissue from pressure and/or shear. DTI Right Heel (Active)  09/02/20 2230  Location: Heel  Location Orientation: Right  Staging: Deep Tissue Pressure Injury - Purple or maroon localized area of discolored intact skin or blood-filled blister due to damage of underlying soft tissue from pressure and/or shear.  Wound Description (Comments): DTI Right Heel  Present on Admission: -- (Per patient, she has had for Del Overfelt while)     Pressure Injury 12/19/20 Vertebral column Mid Deep Tissue Pressure Injury - Purple or maroon localized area of discolored intact skin or blood-filled blister due to damage of underlying soft tissue from pressure and/or shear. (Active)  12/19/20 2254  Location: Vertebral column  Location Orientation: Mid  Staging: Deep Tissue Pressure Injury - Purple or maroon localized area of discolored intact skin or blood-filled blister due to damage of underlying soft tissue from pressure and/or shear.  Wound Description (Comments):   Present on Admission: Yes   DVT prophylaxis: heparin Code Status: full  Family Communication: daughter in law at bedside Disposition:   Status is: Inpatient  Remains inpatient appropriate because:Inpatient  level of care appropriate due to severity of illness  Dispo: The patient is from: Home              Anticipated d/c is to: SNF              Patient currently is not medically stable to d/c.   Difficult to place patient No       Consultants:  Vascular Podiatry  Procedures:  ABI Summary:  Right: Resting right ankle-brachial index indicates noncompressible right  lower extremity arteries and monophasic waveforms. The right toe-brachial  index is abnormal (absence of flow).   Left: Resting left ankle-brachial index indicates noncompressible left  lower extremity arteries and monophasic waveforms. The left toe-brachial  index is abnormal (severe small vessel disease).   Antimicrobials:  Anti-infectives (From admission, onward)    Start     Dose/Rate Route Frequency Ordered Stop   12/21/20 1045  cephALEXin (KEFLEX) capsule 500 mg        500 mg Oral Every 12 hours 12/21/20 0954 12/23/20 2126   12/20/20 0730  vancomycin (VANCOCIN) IVPB 1000 mg/200 mL premix        1,000 mg 200 mL/hr over 60 Minutes Intravenous  Once 12/20/20 0637 12/20/20 1202   12/20/20 0730  piperacillin-tazobactam (ZOSYN) IVPB 2.25 g  Status:  Discontinued        2.25 g 100 mL/hr over 30  Minutes Intravenous Every 8 hours 12/20/20 0638 12/21/20 0854   12/19/20 1400  piperacillin-tazobactam (ZOSYN) IVPB 3.375 g  Status:  Discontinued        3.375 g 12.5 mL/hr over 240 Minutes Intravenous Every 8 hours 12/19/20 0715 12/19/20 0716   12/19/20 1400  piperacillin-tazobactam (ZOSYN) IVPB 3.375 g  Status:  Discontinued        3.375 g 100 mL/hr over 30 Minutes Intravenous Every 8 hours 12/19/20 0718 12/20/20 0638   12/18/20 2200  piperacillin-tazobactam (ZOSYN) IVPB 2.25 g  Status:  Discontinued        2.25 g 100 mL/hr over 30 Minutes Intravenous Every 8 hours 12/18/20 1842 12/19/20 0715   12/18/20 1555  vancomycin variable dose per unstable renal function (pharmacist dosing)  Status:  Discontinued         Does not  apply See admin instructions 12/18/20 1555 12/21/20 0854   12/18/20 1500  vancomycin (VANCOCIN) IVPB 1000 mg/200 mL premix        1,000 mg 200 mL/hr over 60 Minutes Intravenous  Once 12/18/20 1458 12/18/20 1622   12/18/20 1400  piperacillin-tazobactam (ZOSYN) IVPB 3.375 g        3.375 g 100 mL/hr over 30 Minutes Intravenous  Once 12/18/20 1349 12/18/20 1510          Subjective: No complaints today  Objective: Vitals:   01/01/21 0504 01/01/21 0925 01/01/21 1647 01/01/21 2013  BP: 130/63 140/66 (!) 140/58 (!) 155/57  Pulse: 63 63 66 67  Resp: 17 16 18 18   Temp: 98 F (36.7 C) 98.1 F (36.7 C) 98.1 F (36.7 C) 97.6 F (36.4 C)  TempSrc: Oral Oral Oral Oral  SpO2: 99% 98% 99% 99%  Weight:      Height:        Intake/Output Summary (Last 24 hours) at 01/01/2021 2116 Last data filed at 01/01/2021 1700 Gross per 24 hour  Intake 480 ml  Output 0 ml  Net 480 ml   Filed Weights   12/28/20 0501 12/29/20 0530 12/31/20 2005  Weight: 67.4 kg 65.2 kg 66.1 kg    Examination:  General: No acute distress. Cardiovascular: RRR Lungs: unlabored Abdomen: Soft, nontender, nondistended  Neurological: Alert. Moves all extremities 4. Cranial nerves II through XII grossly intact. Extremities: unna boots bilaterally   Data Reviewed: I have personally reviewed following labs and imaging studies  CBC: Recent Labs  Lab 12/27/20 0333 12/28/20 0327 12/29/20 0332 12/31/20 0403 01/01/21 0422  WBC 8.3 7.0 6.9 6.3 6.4  NEUTROABS 6.0 4.6  --   --   --   HGB 9.1* 9.4* 8.8* 9.6* 9.8*  HCT 30.1* 30.3* 28.2* 30.5* 30.9*  MCV 80.5 79.9* 79.7* 79.2* 79.8*  PLT 217 238 264 255 099    Basic Metabolic Panel: Recent Labs  Lab 12/27/20 0333 12/28/20 0327 12/29/20 0332 12/31/20 0403 01/01/21 0422  NA 137 137 136 135 137  K 4.1 4.2 4.4 4.1 4.1  CL 110 110 110 107 110  CO2 20* 20* 20* 21* 21*  GLUCOSE 116* 99 99 119* 89  BUN 33* 33* 35* 31* 31*  CREATININE 1.38* 1.32* 1.34* 1.28*  1.31*  CALCIUM 9.1 9.2 9.1 9.3 9.4  MG 1.7 1.8  --   --  1.7  PHOS 3.6 3.5  --   --  3.7    GFR: Estimated Creatinine Clearance: 35.3 mL/min (Olla Delancey) (by C-G formula based on SCr of 1.31 mg/dL (H)).  Liver Function Tests: Recent Labs  Lab 12/27/20 [id number]  12/28/20 0327  AST 13* 15  ALT 10 10  ALKPHOS 105 108  BILITOT 0.4 0.4  PROT 6.0* 5.8*  ALBUMIN 1.9* 1.9*    CBG: Recent Labs  Lab 12/31/20 2118 01/01/21 0635 01/01/21 1129 01/01/21 1607 01/01/21 2011  GLUCAP 95 101* 107* 129* 119*     No results found for this or any previous visit (from the past 240 hour(s)).        Radiology Studies: No results found.      Scheduled Meds:  aspirin EC  81 mg Oral Daily   atenolol  100 mg Oral Daily   cloNIDine  0.1 mg Oral BID   heparin  5,000 Units Subcutaneous Q8H   insulin aspart  0-5 Units Subcutaneous QHS   insulin aspart  0-9 Units Subcutaneous TID WC   liver oil-zinc oxide   Topical TID   senna  1 tablet Oral BID   Continuous Infusions:  sodium chloride Stopped (12/21/20 2036)   lactated ringers       LOS: 14 days    Time spent: over 30 min    Fayrene Helper, MD [facility]   To contact the attending provider between 7A-7P or the covering provider during after hours 7P-7A, please log into the web site [email or web address] and access using universal [place] password for that web site. If you do not have the password, please call the hospital operator.  01/01/2021, 9:16 PM

## 2021-01-01 NOTE — Plan of Care (Signed)
  Problem: Health Behavior/Discharge Planning: Goal: Ability to manage health-related needs will improve Outcome: Progressing   Problem: Clinical Measurements: Goal: Ability to maintain clinical measurements within normal limits will improve Outcome: Progressing   Problem: Safety: Goal: Ability to remain free from injury will improve Outcome: Progressing   

## 2021-01-01 NOTE — Progress Notes (Signed)
Occupational Therapy Treatment Patient Details Name: Jacqueline Richmond MRN: [id number] DOB: 1945-05-04 Today's Date: 01/01/2021   History of present illness 75 y.o. female presents to [facility] hospital on 12/18/2020 from podiatry with worsening R heel wound. PMH includes T2DM, HTN, HLD, CKD stage IV, gout, diverticulosis recently hospitalized 6/9-08/2012 for wound debridement and application of wound VAC on a previously operated T10 fracture site, discharged to SNF for 2 months prior to returning home.   OT comments  Pt at this time presented in bed and agreed to session. Pt completed rolling with moderate assist to R side with max assist for peri care. Pt then completed supine to sitting at EOB with moderate assist with HOB elevated and bed rail. Pt then started to feel ill and vomited. Pt completed oral care while sitting at EOB and required set up of toothpaste being open up. Pt then went from sitting to supine with moderate assist. Pt currently with functional limitations due to the deficits listed below (see OT Problem List).  Pt will benefit from skilled OT to increase their safety and independence with ADL and functional mobility for ADL to facilitate discharge to venue listed below.     Recommendations for follow up therapy are one component of a multi-disciplinary discharge planning process, led by the attending physician.  Recommendations may be updated based on patient status, additional functional criteria and insurance authorization.    Follow Up Recommendations  SNF    Equipment Recommendations  None recommended by OT    Recommendations for Other Services      Precautions / Restrictions Precautions Precautions: Fall Precaution Comments: Pt reports very tender LE Restrictions Weight Bearing Restrictions: No       Mobility Bed Mobility Overal bed mobility: Needs Assistance Bed Mobility: Supine to Sit Rolling: Mod assist Sidelying to sit: Mod assist;HOB elevated Supine to sit: Mod  assist Sit to supine: Mod assist;HOB elevated   General bed mobility comments: Pt reported going out of the bed to the R side seemed to be easier to complete at this time    Transfers Overall transfer level: Needs assistance               General transfer comment: unable to attempt due to pain in BLE and becoming ill    Balance Overall balance assessment: Needs assistance Sitting-balance support: Bilateral upper extremity supported;Feet supported Sitting balance-Leahy Scale: Fair Sitting balance - Comments: Pt was able to complete sitting at EOB about 5-8 mins but was liited to participate in activity as feeling ill                                   ADL either performed or assessed with clinical judgement   ADL Overall ADL's : Needs assistance/impaired Eating/Feeding: Set up;Sitting   Grooming: Set up;Sitting   Upper Body Bathing: Min guard;Sitting   Lower Body Bathing: Maximal assistance;Cueing for safety;Cueing for sequencing;Sitting/lateral leans   Upper Body Dressing : Min guard;Sitting   Lower Body Dressing: Maximal assistance;Cueing for safety;Cueing for sequencing;Sitting/lateral leans                 General ADL Comments: Pt limited in session as pt started to have episode of nasua     Vision   Vision Assessment?: No apparent visual deficits   Perception     Praxis      Cognition Arousal/Alertness: Awake/alert Behavior During Therapy: WFL for tasks assessed/performed Overall  Cognitive Status: Impaired/Different from baseline Area of Impairment: Following commands;Problem solving                   Current Attention Level: Selective                    Exercises     Shoulder Instructions       General Comments      Pertinent Vitals/ Pain       Pain Assessment: Faces Faces Pain Scale: Hurts whole lot Pain Location: BLE, back Pain Descriptors / Indicators: Grimacing Pain Intervention(s): Limited activity  within patient's tolerance;Monitored during session;Repositioned  Home Living                                          Prior Functioning/Environment              Frequency  Min 2X/week        Progress Toward Goals  OT Goals(current goals can now be found in the care plan section)  Progress towards OT goals: Progressing toward goals  Acute Rehab OT Goals Patient Stated Goal: to not feel ill when sitting at EOB OT Goal Formulation: With patient/family Time For Goal Achievement: 01/05/21 Potential to Achieve Goals: Good ADL Goals Pt Will Perform Grooming: with supervision;standing Pt Will Perform Lower Body Bathing: with min guard assist;sitting/lateral leans;sit to/from stand Pt Will Perform Lower Body Dressing: with min guard assist;sitting/lateral leans;sit to/from stand Pt Will Transfer to Toilet: with min guard assist;ambulating Pt Will Perform Toileting - Clothing Manipulation and hygiene: with min guard assist;sitting/lateral leans;sit to/from stand  Plan Discharge plan remains appropriate    Co-evaluation                 AM-PAC OT "6 Clicks" Daily Activity     Outcome Measure   Help from another person eating meals?: A Little Help from another person taking care of personal grooming?: A Little Help from another person toileting, which includes using toliet, bedpan, or urinal?: A Lot Help from another person bathing (including washing, rinsing, drying)?: A Lot Help from another person to put on and taking off regular upper body clothing?: A Little Help from another person to put on and taking off regular lower body clothing?: A Lot 6 Click Score: 15    End of Session    OT Visit Diagnosis: Unsteadiness on feet (R26.81);Other abnormalities of gait and mobility (R26.89);Muscle weakness (generalized) (M62.81)   Activity Tolerance Other (comment) (Becoming ill in sesion)   Patient Left in bed;with call bell/phone within reach;with bed  alarm set   Nurse Communication Other (comment)        Time: 5625-6389 OT Time Calculation (min): 42 min  Charges: OT General Charges $OT Visit: 1 Visit OT Treatments $Self Care/Home Management : 38-52 mins  Joeseph Amor OTR/L  Acute Rehab Services  [phone] office number [phone] pager number   Joeseph Amor 01/01/2021, 12:38 PM

## 2021-01-02 DIAGNOSIS — L03115 Cellulitis of right lower limb: Secondary | ICD-10-CM | POA: Diagnosis not present

## 2021-01-02 DIAGNOSIS — E875 Hyperkalemia: Secondary | ICD-10-CM | POA: Diagnosis not present

## 2021-01-02 DIAGNOSIS — E1151 Type 2 diabetes mellitus with diabetic peripheral angiopathy without gangrene: Secondary | ICD-10-CM | POA: Diagnosis not present

## 2021-01-02 DIAGNOSIS — S81801D Unspecified open wound, right lower leg, subsequent encounter: Secondary | ICD-10-CM | POA: Diagnosis not present

## 2021-01-02 LAB — COMPREHENSIVE METABOLIC PANEL
ALT: 12 U/L (ref 0–44)
AST: 22 U/L (ref 15–41)
Albumin: 2.3 g/dL — ABNORMAL LOW (ref 3.5–5.0)
Alkaline Phosphatase: 113 U/L (ref 38–126)
Anion gap: 7 (ref 5–15)
BUN: 31 mg/dL — ABNORMAL HIGH (ref 8–23)
CO2: 20 mmol/L — ABNORMAL LOW (ref 22–32)
Calcium: 9.3 mg/dL (ref 8.9–10.3)
Chloride: 106 mmol/L (ref 98–111)
Creatinine, Ser: 1.25 mg/dL — ABNORMAL HIGH (ref 0.44–1.00)
GFR, Estimated: 45 mL/min — ABNORMAL LOW (ref >60)
Glucose, Bld: 87 mg/dL (ref 70–99)
Potassium: 3.8 mmol/L (ref 3.5–5.1)
Sodium: 133 mmol/L — ABNORMAL LOW (ref 135–145)
Total Bilirubin: 0.7 mg/dL (ref 0.3–1.2)
Total Protein: 6 g/dL — ABNORMAL LOW (ref 6.5–8.1)

## 2021-01-02 LAB — RESP PANEL BY RT-PCR (FLU A&B, COVID) ARPGX2
Influenza A by PCR: NEGATIVE
Influenza B by PCR: NEGATIVE
SARS Coronavirus 2 by RT PCR: NEGATIVE

## 2021-01-02 LAB — CBC WITH DIFFERENTIAL/PLATELET
Abs Immature Granulocytes: 0.02 10*3/uL (ref 0.00–0.07)
Basophils Absolute: 0.1 10*3/uL (ref 0.0–0.1)
Basophils Relative: 1 %
Eosinophils Absolute: 0.1 10*3/uL (ref 0.0–0.5)
Eosinophils Relative: 2 %
HCT: 31.6 % — ABNORMAL LOW (ref 36.0–46.0)
Hemoglobin: 9.6 g/dL — ABNORMAL LOW (ref 12.0–15.0)
Immature Granulocytes: 0 %
Lymphocytes Relative: 19 %
Lymphs Abs: 1.3 10*3/uL (ref 0.7–4.0)
MCH: 24.7 pg — ABNORMAL LOW (ref 26.0–34.0)
MCHC: 30.4 g/dL (ref 30.0–36.0)
MCV: 81.2 fL (ref 80.0–100.0)
Monocytes Absolute: 0.6 10*3/uL (ref 0.1–1.0)
Monocytes Relative: 8 %
Neutro Abs: 4.7 10*3/uL (ref 1.7–7.7)
Neutrophils Relative %: 70 %
Platelets: 280 10*3/uL (ref 150–400)
RBC: 3.89 MIL/uL (ref 3.87–5.11)
RDW: 18.2 % — ABNORMAL HIGH (ref 11.5–15.5)
WBC: 6.7 10*3/uL (ref 4.0–10.5)
nRBC: 0 % (ref 0.0–0.2)

## 2021-01-02 LAB — GLUCOSE, CAPILLARY
Glucose-Capillary: 101 mg/dL — ABNORMAL HIGH (ref 70–99)
Glucose-Capillary: 90 mg/dL (ref 70–99)
Glucose-Capillary: 93 mg/dL (ref 70–99)
Glucose-Capillary: 95 mg/dL (ref 70–99)

## 2021-01-02 LAB — PHOSPHORUS: Phosphorus: 3.4 mg/dL (ref 2.5–4.6)

## 2021-01-02 LAB — MAGNESIUM: Magnesium: 1.6 mg/dL — ABNORMAL LOW (ref 1.7–2.4)

## 2021-01-02 MED ORDER — ONDANSETRON HCL 4 MG/2ML IJ SOLN
4.0000 mg | Freq: Four times a day (QID) | INTRAMUSCULAR | Status: DC | PRN
  Administered 2021-01-02: 4 mg via INTRAVENOUS
  Filled 2021-01-02: qty 2

## 2021-01-02 MED ORDER — MAGNESIUM SULFATE 2 GM/50ML IV SOLN
2.0000 g | Freq: Once | INTRAVENOUS | Status: AC
  Administered 2021-01-02: 2 g via INTRAVENOUS
  Filled 2021-01-02: qty 50

## 2021-01-02 NOTE — Plan of Care (Signed)

## 2021-01-02 NOTE — Progress Notes (Signed)
PROGRESS NOTE    Jacqueline Richmond  QAS:[id number] DOB: [birth year] DOA: 12/18/2020 PCP: Bernerd Limbo, MD   Brief Narrative: This 41 old female with history of T2DM, HTN, HLD, CKD stage IV, gout, diverticulosis recently hospitalized from 6/9-6/20 for wound debridement and application of wound VAC on a previously operated T10 fracture site, has been in [facility]  for 2 months and ultimately discharged home few weeks prior to admission. Patient states that while at [facility], she developed wound and ulcer in her right heel.On 9/27, patient presented to podiatry Dr. Pasty Arch office for evaluation of worsening wound on the right heel.  Previously, she was tried on Keflex and doxycycline by her PCP without improvement. In the podiatrist office, she was noted to have redness around the wound with drainage, full-thickness ulceration of the right heel.  There was a a concern of osteomyelitis of the right heel with possible venous insufficiency.    In the ED, patient was afebrile, hemodynamically stable, labs remarkable for hyperkalemia AKI.  X-rays of right tibia-fibula and foot were obtained.  It did not show any evidence of osteomyelitis.  Admitted for further work-up and management at [facility] and was transferred to [facility] for vascular evaluation due to abnormal ABI.   Seen by vascular felt to be chronic PVD and venous ulceration.  Recommended bilateral unna boots and offloading, not thought to be a good revascularization candidate.  Follow up with VVS in 1 month for wound check, if wound deteriorating, will consider angiography.   Patient was placed on IV antibiotic briefly and subsequently changed to p.o. and complete the course.  Will need skilled nursing for placement and will need outpatient wound care follow-up.   Hospital course complicated with acute metabolic encephalopathy now resolved.   Awaiting SNF at this point.  Of note, orthostasis working with therapy past 2 days.   Assessment & Plan:   Active  Problems:   Leg wound, right   Diabetic foot (HCC)   Cellulitis of right lower leg   Venous insufficiency  Orthostatic hypotension: Symptomatic with therapy for past few days, continue gentle IV hydration Taper down clonidine and follow-up.  Bilateral LE Ulceration/posterior heel ulceration with cellulitis ABI with noncompressible RLE artery - right TBI abnormal (absence of flow).  Left ABI with noncompressible LLE arteries, abnormal L TBI (severe small vessel disease).   Vascular surgery and podiatry consulted. Per vascular, venous disease mixed with PAD - wounds appear venous with right heel pressure ulcer. Recommending bilateral UNNA boots, offloading.  Not a good revascularization candidate.   Plan for 1 month follow up, consider angiography if wounds deteriorating. Wound recs per vascular and podiatry Currently getting unna boots twice weekly, silver alginate to heel wound on right and bilateral unna boot applications MRI heel without osteomelitis - mild tendinosis of distal achilles tendon, partial thickness cartilage loss of tibiotalar joint, mild OA of talonavicular joint, OA of naviculaar medial cuneiform joint S/p abx 9/27 - 10/2.  Completed course.   Chronic appearing PAD:  based on ABI with absent flow on tibial arteries:  Continue aspirin 81,  follow with vascular.  Has statin intolerance.     Acute metabolic encephalopathy: Patient is much more alert, awake oriented and interactive.   Continue supportive care PT OT.   MRI brain showed chronic small vessel ischemic disease, overall mentation much better.    Of note she had recent fall during thoracentesis in IR in May- had eeg and mri brain and neuro eval that time. Thought may  be it was a panic attack   Generalized weakness/falls Hx of fall and back injusry T10 fracture in May 10 Recently hospitalized then to SNF for a month:. Continue PT OT, awaiting snf.   AKI on CKD stage IV Metabolic acidosis:  Baseline creat  2.19/16 peaked at 3.5, improved better than the baseline.   Encourage oral intake.Off IV fluids.   Hyperkalemia: Resolved   Type 2 diabetes : Controlled. continue to hold home Januvia   Essential hypertension: BP fairly stable on atenolol and clonidine.  Taper clonidine with orthostasis above   Hyperlipidemia: Patient has a statin allergy   History of gout- Allopurinol on hold-resume on d/c.   Microcytic anemia  Likely from chronic kidney disease.  Hemoglobin stable.    Pressure Injury 09/02/20 Heel Right Deep Tissue Pressure Injury - Purple or maroon localized area of discolored intact skin or blood-filled blister due to damage of underlying soft tissue from pressure and/or shear. DTI Right Heel (Active)  09/02/20 2230  Location: Heel  Location Orientation: Right  Staging: Deep Tissue Pressure Injury - Purple or maroon localized area of discolored intact skin or blood-filled blister due to damage of underlying soft tissue from pressure and/or shear.  Wound Description (Comments): DTI Right Heel  Present on Admission: -- (Per patient, she has had for a while)     Pressure Injury 12/19/20 Vertebral column Mid Deep Tissue Pressure Injury - Purple or maroon localized area of discolored intact skin or blood-filled blister due to damage of underlying soft tissue from pressure and/or shear. (Active)  12/19/20 2254  Location: Vertebral column  Location Orientation: Mid  Staging: Deep Tissue Pressure Injury - Purple or maroon localized area of discolored intact skin or blood-filled blister due to damage of underlying soft tissue from pressure and/or shear.  Wound Description (Comments):   Present on Admission: Yes     DVT prophylaxis: Heparin Code Status:  Full code. Family Communication: No family at bedside. Disposition Plan:   Status is: Inpatient  Remains inpatient appropriate because:Inpatient level of care appropriate due to severity of illness  Dispo: The patient is from:  Home              Anticipated d/c is to: Home              Patient currently is not medically stable to d/c.   Difficult to place patient No  Consultants:  Vascular and podiatry  Procedures: None  Antimicrobials:None   Subjective: Patient was seen and examined at bedside.  Overnight events noted.  Patient reports feeling much improved. Patient has significant orthostatic hypotension. She is not able to stand up.  Objective: Vitals:   01/02/21 1212 01/02/21 1439 01/02/21 1445 01/02/21 1449  BP: 140/63 135/60 (!) 154/67 (!) 158/103  Pulse: 68 66 68 74  Resp: 17     Temp: 98.1 F (36.7 C)     TempSrc: Oral     SpO2: 98%     Weight:      Height:        Intake/Output Summary (Last 24 hours) at 01/02/2021 1531 Last data filed at 01/02/2021 0920 Gross per 24 hour  Intake 480 ml  Output 1 ml  Net 479 ml   Filed Weights   12/28/20 0501 12/29/20 0530 12/31/20 2005  Weight: 67.4 kg 65.2 kg 66.1 kg    Examination:  General exam: Appears comfortable, not in any acute distress.  Deconditioned Respiratory system: Clear to auscultation. Respiratory effort normal. Cardiovascular system: S1-S2 heard,  regular rate and rhythm, no murmur. Gastrointestinal system: Abdomen is soft, nontender, nondistended, BS + Central nervous system: Alert and oriented. No focal neurological deficits. Extremities: No edema, no cyanosis, no clubbing.  Bilateral Unna boots. Skin: No rashes, lesions or ulcers Psychiatry: Judgement and insight appear normal. Mood & affect appropriate.     Data Reviewed: I have personally reviewed following labs and imaging studies  CBC: Recent Labs  Lab 12/27/20 0333 12/28/20 0327 12/29/20 0332 12/31/20 0403 01/01/21 0422 01/02/21 0305  WBC 8.3 7.0 6.9 6.3 6.4 6.7  NEUTROABS 6.0 4.6  --   --   --  4.7  HGB 9.1* 9.4* 8.8* 9.6* 9.8* 9.6*  HCT 30.1* 30.3* 28.2* 30.5* 30.9* 31.6*  MCV 80.5 79.9* 79.7* 79.2* 79.8* 81.2  PLT 217 238 264 255 289 235   Basic  Metabolic Panel: Recent Labs  Lab 12/27/20 0333 12/28/20 0327 12/29/20 0332 12/31/20 0403 01/01/21 0422 01/02/21 0305  NA 137 137 136 135 137 133*  K 4.1 4.2 4.4 4.1 4.1 3.8  CL 110 110 110 107 110 106  CO2 20* 20* 20* 21* 21* 20*  GLUCOSE 116* 99 99 119* 89 87  BUN 33* 33* 35* 31* 31* 31*  CREATININE 1.38* 1.32* 1.34* 1.28* 1.31* 1.25*  CALCIUM 9.1 9.2 9.1 9.3 9.4 9.3  MG 1.7 1.8  --   --  1.7 1.6*  PHOS 3.6 3.5  --   --  3.7 3.4   GFR: Estimated Creatinine Clearance: 37 mL/min (A) (by C-G formula based on SCr of 1.25 mg/dL (H)). Liver Function Tests: Recent Labs  Lab 12/27/20 0333 12/28/20 0327 01/02/21 0305  AST 13* 15 22  ALT 10 10 12   ALKPHOS 105 108 113  BILITOT 0.4 0.4 0.7  PROT 6.0* 5.8* 6.0*  ALBUMIN 1.9* 1.9* 2.3*   No results for input(s): LIPASE, AMYLASE in the last 168 hours. No results for input(s): AMMONIA in the last 168 hours. Coagulation Profile: No results for input(s): INR, PROTIME in the last 168 hours. Cardiac Enzymes: No results for input(s): CKTOTAL, CKMB, CKMBINDEX, TROPONINI in the last 168 hours. BNP (last 3 results) No results for input(s): PROBNP in the last 8760 hours. HbA1C: No results for input(s): HGBA1C in the last 72 hours. CBG: Recent Labs  Lab 01/01/21 1129 01/01/21 1607 01/01/21 2011 01/02/21 0647 01/02/21 1129  GLUCAP 107* 129* 119* 101* 90   Lipid Profile: No results for input(s): CHOL, HDL, LDLCALC, TRIG, CHOLHDL, LDLDIRECT in the last 72 hours. Thyroid Function Tests: No results for input(s): TSH, T4TOTAL, FREET4, T3FREE, THYROIDAB in the last 72 hours. Anemia Panel: No results for input(s): VITAMINB12, FOLATE, FERRITIN, TIBC, IRON, RETICCTPCT in the last 72 hours. Sepsis Labs: No results for input(s): PROCALCITON, LATICACIDVEN in the last 168 hours.  Recent Results (from the past 240 hour(s))  Resp Panel by RT-PCR (Flu A&B, Covid) Nasopharyngeal Swab     Status: None   Collection Time: 01/02/21 10:35 AM    Specimen: Nasopharyngeal Swab; Nasopharyngeal(NP) swabs in vial transport medium  Result Value Ref Range Status   SARS Coronavirus 2 by RT PCR NEGATIVE NEGATIVE Final    Comment: (NOTE) SARS-CoV-2 target nucleic acids are NOT DETECTED.  The SARS-CoV-2 RNA is generally detectable in upper respiratory specimens during the acute phase of infection. The lowest concentration of SARS-CoV-2 viral copies this assay can detect is 138 copies/mL. A negative result does not preclude SARS-Cov-2 infection and should not be used as the sole basis for treatment or other patient  management decisions. A negative result may occur with  improper specimen collection/handling, submission of specimen other than nasopharyngeal swab, presence of viral mutation(s) within the areas targeted by this assay, and inadequate number of viral copies(<138 copies/mL). A negative result must be combined with clinical observations, patient history, and epidemiological information. The expected result is Negative.  Fact Sheet for Patients:  EntrepreneurPulse.com.au  Fact Sheet for Healthcare Providers:  IncredibleEmployment.be  This test is no t yet approved or cleared by the [place] FDA and  has been authorized for detection and/or diagnosis of SARS-CoV-2 by FDA under an Emergency Use Authorization (EUA). This EUA will remain  in effect (meaning this test can be used) for the duration of the COVID-19 declaration under Section 564(b)(1) of the Act, 21 U.S.C.section 360bbb-3(b)(1), unless the authorization is terminated  or revoked sooner.       Influenza A by PCR NEGATIVE NEGATIVE Final   Influenza B by PCR NEGATIVE NEGATIVE Final    Comment: (NOTE) The Xpert Xpress SARS-CoV-2/FLU/RSV plus assay is intended as an aid in the diagnosis of influenza from Nasopharyngeal swab specimens and should not be used as a sole basis for treatment. Nasal washings and aspirates are  unacceptable for Xpert Xpress SARS-CoV-2/FLU/RSV testing.  Fact Sheet for Patients: EntrepreneurPulse.com.au  Fact Sheet for Healthcare Providers: IncredibleEmployment.be  This test is not yet approved or cleared by the [place] FDA and has been authorized for detection and/or diagnosis of SARS-CoV-2 by FDA under an Emergency Use Authorization (EUA). This EUA will remain in effect (meaning this test can be used) for the duration of the COVID-19 declaration under Section 564(b)(1) of the Act, 21 U.S.C. section 360bbb-3(b)(1), unless the authorization is terminated or revoked.  Performed at [facility], [place] [address], [place], [place] [address]     Radiology Studies: No results found.  Scheduled Meds:  aspirin EC  81 mg Oral Daily   atenolol  100 mg Oral Daily   cloNIDine  0.1 mg Oral BID   heparin  5,000 Units Subcutaneous Q8H   insulin aspart  0-5 Units Subcutaneous QHS   insulin aspart  0-9 Units Subcutaneous TID WC   liver oil-zinc oxide   Topical TID   senna  1 tablet Oral BID   Continuous Infusions:  sodium chloride Stopped (12/21/20 2036)     LOS: 15 days    Time spent: 35 mins    Gracin Soohoo, MD [facility]   If 7PM-7AM, please contact night-coverage

## 2021-01-02 NOTE — TOC Progression Note (Signed)
Transition of Care [facility]) - Progression Note    Patient Details  Name: MAZY CULTON MRN: [id number] Date of Birth: [birth year]  Transition of Care [facility]) CM/SW Contact  [name], LCSW Phone Number: 01/02/2021, 4:26 PM  Clinical Narrative:  Confirmed with daughter-in-law [name] that patient will discharge to [facility] pending insurance authorization. Submitted for insurance authorization with [employer] and received insurance auth: Plan auth [id number] Floss ID - [id number] effective 10/12 - 10/14. Authorization information provided to [facility], admissions Mudlogger at [facility] Doctor expressed concerns with patient's orthostatic BP and decided to not discharge patient today. Juliann Pulse at [facility] was contacted and updated and indicated that patient can discharge to them tomorrow -10/13.      Expected Discharge Plan: [place] Barriers to Discharge: Continued Medical Work up, Orthoptist and Services Expected Discharge Plan: Standard       Living arrangements for the past 2 months: Single Family Home                                       Social Determinants of Health (SDOH) Interventions  No SDOH interventions requested or needed at this time  Readmission Risk Interventions No flowsheet data found.

## 2021-01-03 DIAGNOSIS — E875 Hyperkalemia: Secondary | ICD-10-CM | POA: Diagnosis not present

## 2021-01-03 DIAGNOSIS — E1151 Type 2 diabetes mellitus with diabetic peripheral angiopathy without gangrene: Secondary | ICD-10-CM | POA: Diagnosis not present

## 2021-01-03 DIAGNOSIS — I872 Venous insufficiency (chronic) (peripheral): Secondary | ICD-10-CM | POA: Diagnosis not present

## 2021-01-03 DIAGNOSIS — Z23 Encounter for immunization: Secondary | ICD-10-CM | POA: Diagnosis present

## 2021-01-03 DIAGNOSIS — S81801D Unspecified open wound, right lower leg, subsequent encounter: Secondary | ICD-10-CM | POA: Diagnosis not present

## 2021-01-03 LAB — GLUCOSE, CAPILLARY
Glucose-Capillary: 88 mg/dL (ref 70–99)
Glucose-Capillary: 91 mg/dL (ref 70–99)
Glucose-Capillary: 90 mg/dL (ref 70–99)

## 2021-01-03 LAB — CBC
HCT: 31.6 % — ABNORMAL LOW (ref 36.0–46.0)
Hemoglobin: 9.8 g/dL — ABNORMAL LOW (ref 12.0–15.0)
MCH: 25.1 pg — ABNORMAL LOW (ref 26.0–34.0)
MCHC: 31 g/dL (ref 30.0–36.0)
MCV: 80.8 fL (ref 80.0–100.0)
Platelets: 269 10*3/uL (ref 150–400)
RBC: 3.91 MIL/uL (ref 3.87–5.11)
RDW: 18 % — ABNORMAL HIGH (ref 11.5–15.5)
WBC: 5.8 10*3/uL (ref 4.0–10.5)
nRBC: 0 % (ref 0.0–0.2)

## 2021-01-03 LAB — BASIC METABOLIC PANEL
Anion gap: 6 (ref 5–15)
BUN: 29 mg/dL — ABNORMAL HIGH (ref 8–23)
CO2: 22 mmol/L (ref 22–32)
Calcium: 9.3 mg/dL (ref 8.9–10.3)
Chloride: 109 mmol/L (ref 98–111)
Creatinine, Ser: 1.19 mg/dL — ABNORMAL HIGH (ref 0.44–1.00)
GFR, Estimated: 48 mL/min — ABNORMAL LOW (ref >60)
Glucose, Bld: 90 mg/dL (ref 70–99)
Potassium: 3.9 mmol/L (ref 3.5–5.1)
Sodium: 137 mmol/L (ref 135–145)

## 2021-01-03 LAB — PHOSPHORUS: Phosphorus: 3.5 mg/dL (ref 2.5–4.6)

## 2021-01-03 LAB — MAGNESIUM: Magnesium: 1.9 mg/dL (ref 1.7–2.4)

## 2021-01-03 NOTE — Discharge Summary (Addendum)
Physician Discharge Summary  Jacqueline Richmond NOB:[id number] DOB: January 19, 1946 DOA: 12/18/2020  PCP: Bernerd Limbo, MD  Admit date: 12/18/2020  Discharge date: 01/03/2021  Admitted From: Home  Disposition:  SNF  Recommendations for Outpatient Follow-up:  Follow up with PCP in 1-2 weeks. Please obtain BMP/CBC in one week. Advised to follow-up with vascular surgery and podiatry as scheduled.   Advised to continue aspirin. Advised wound care.  Advised to follow-up with wound care.  Home Health: None Equipment/Devices: None  Discharge Condition: Good CODE STATUS:Full code Diet recommendation: Heart Healthy  Brief Summary / Hospital Course: This 74 old female with history of T2DM, HTN, HLD, CKD stage IV, gout, diverticulosis recently hospitalized from 6/9-6/20 for wound debridement and application of wound VAC on a previously operated T10 fracture site, has been in [facility] for 2 months and ultimately discharged home few weeks prior to admission. Patient states that while at [facility], she developed wound and ulcer in her right heel. On 9/27, patient presented to podiatry Dr. Pasty Arch office for evaluation of worsening wound on the right heel.  Previously, she was tried on Keflex and doxycycline by her PCP without improvement. In the podiatrist office, she was noted to have redness around the wound with drainage, full-thickness ulceration of the right heel.  There was a a concern of osteomyelitis of the right heel with possible venous insufficiency.  In the ED, patient was afebrile, hemodynamically stable, labs remarkable for hyperkalemia AKI.  X-rays of right tibia-fibula and foot were obtained.  It did not show any evidence of osteomyelitis.  Admitted for further work-up and management at [facility] and was transferred to [facility] for vascular evaluation due to abnormal ABI.   Seen by vascular surgery , felt to be chronic PVD and venous ulceration.  Recommended bilateral unna boots and offloading, not  thought to be a good revascularization candidate.  Follow up with VVS in 1 month for wound check, if wound deteriorating, will consider angiography.   Patient was placed on IV antibiotics briefly and subsequently changed to p.o. and completed the course.  Patient will need outpatient wound care follow-up. Hospital course complicated with acute metabolic encephalopathy which is now resolved. Patient is being discharged to [facility] for rehab.  She was managed for below problems.  Discharge Diagnoses:  Active Problems:   Leg wound, right   Diabetic foot (HCC)   Cellulitis of right lower leg   Venous insufficiency  Orthostatic hypotension: Orthostatic hypotension has improved with IV hydration.   Bilateral LE Ulceration/posterior heel ulceration with cellulitis ABI with noncompressible RLE artery - right TBI abnormal (absence of flow).  Left ABI with noncompressible LLE arteries, abnormal L TBI (severe small vessel disease).   Vascular surgery and podiatry consulted. Per vascular, venous disease mixed with PAD - wounds appear venous with right heel pressure ulcer. Recommending bilateral UNNA boots, offloading.  Not a good revascularization candidate.   Plan for 1 month follow up, consider angiography if wounds deteriorating. Wound recs per vascular and podiatry Currently getting unna boots twice weekly, silver alginate to heel wound on right and bilateral unna boot applications MRI heel without osteomyelitis - mild tendinosis of distal achilles tendon, partial thickness cartilage loss of tibiotalar joint, mild OA of talonavicular joint, OA of naviculaar medial cuneiform joint S/p abx 9/27 - 10/2.  Completed course.   Chronic appearing PAD:  based on ABI with absent flow on tibial arteries:  Continue aspirin 81,  follow with vascular.  Has statin intolerance.  Acute metabolic encephalopathy: Patient is much more alert, awake oriented and interactive.   Continue supportive  care PT OT.   MRI brain showed chronic small vessel ischemic disease, overall mentation much better.    Of note she had recent fall during thoracentesis in IR in May- had eeg and mri brain and neuro eval that time. Thought may be it was a panic attack   Generalized weakness/falls Hx of fall and back injusry T10 fracture in May 10 Recently hospitalized then to SNF for a month:. Continue PT OT, awaiting  SNF   AKI on CKD stage IV Metabolic acidosis:  Baseline creat 2.19/16 peaked at 3.5, improved better than the baseline.   Encourage oral intake. Off IV fluids.   Hyperkalemia: Resolved   Type 2 diabetes : Controlled. Resume Januvia   Essential hypertension: BP fairly stable on atenolol and clonidine.  Taper clonidine with orthostasis above   Hyperlipidemia: Patient has a statin allergy.   History of gout- Allopurinol on hold-resume on d/c.   Microcytic anemia  Likely from chronic kidney disease.  Hemoglobin stable.    Pressure Injury 09/02/20 Heel Right Deep Tissue Pressure Injury - Purple or maroon localized area of discolored intact skin or blood-filled blister due to damage of underlying soft tissue from pressure and/or shear. DTI Right Heel (Active)  09/02/20 2230  Location: Heel  Location Orientation: Right  Staging: Deep Tissue Pressure Injury - Purple or maroon localized area of discolored intact skin or blood-filled blister due to damage of underlying soft tissue from pressure and/or shear.  Wound Description (Comments): DTI Right Heel  Present on Admission: -- (Per patient, she has had for a while)     Pressure Injury 12/19/20 Vertebral column Mid Deep Tissue Pressure Injury - Purple or maroon localized area of discolored intact skin or blood-filled blister due to damage of underlying soft tissue from pressure and/or shear. (Active)  12/19/20 2254  Location: Vertebral column  Location Orientation: Mid  Staging: Deep Tissue Pressure Injury - Purple or maroon localized  area of discolored intact skin or blood-filled blister due to damage of underlying soft tissue from pressure and/or shear.  Wound Description (Comments):   Present on Admission: Yes    Discharge Instructions  Discharge Instructions     Call MD for:  difficulty breathing, headache or visual disturbances   Complete by: As directed    Call MD for:  persistant dizziness or light-headedness   Complete by: As directed    Call MD for:  persistant nausea and vomiting   Complete by: As directed    Diet - low sodium heart healthy   Complete by: As directed    Diet - low sodium heart healthy   Complete by: As directed    Diet Carb Modified   Complete by: As directed    Discharge instructions   Complete by: As directed    Advised to follow-up with primary care physician in 1 week. Advised to follow-up with vascular surgery and podiatry is a scheduled.   Advised to continue aspirin and statins. Advised wound care.  Advised to follow-up with wound care.   Discharge wound care:   Complete by: As directed    Follow up Vascular and podiatry   Discharge wound care:   Complete by: As directed    Follow-up vascular surgery and podiatry.   Increase activity slowly   Complete by: As directed    Increase activity slowly   Complete by: As directed  Allergies as of 01/03/2021       Reactions   Codeine Itching   Iodinated Diagnostic Agents    Other reaction(s): Unknown   Pentazocine Other (See Comments)   Hallucinations   Statins         Medication List     STOP taking these medications    apixaban 2.5 MG Tabs tablet Commonly known as: Eliquis   cephALEXin 500 MG capsule Commonly known as: KEFLEX   doxycycline 100 MG tablet Commonly known as: ADOXA   feeding supplement Liqd   ferrous sulfate 325 (65 FE) MG tablet   HYDROcodone-acetaminophen 5-325 MG tablet Commonly known as: NORCO/VICODIN   mometasone 0.1 % cream Commonly known as: ELOCON   multivitamin with  minerals Tabs tablet   ondansetron 4 MG tablet Commonly known as: ZOFRAN   polyethylene glycol 17 g packet Commonly known as: MIRALAX / GLYCOLAX       TAKE these medications    allopurinol 100 MG tablet Commonly known as: ZYLOPRIM Take 0.5 tablets (50 mg total) by mouth every other day. TAKE 1/2 TABLET (50MG ) BY MOUTH EVERY OTHER DAY FOR GOUT   aspirin EC 81 MG tablet Take 81 mg by mouth daily. Swallow whole.   atenolol 100 MG tablet Commonly known as: TENORMIN Take 1 tablet (100 mg total) by mouth in the morning. (0900)   cloNIDine 0.2 MG tablet Commonly known as: CATAPRES Take 1 tablet (0.2 mg total) by mouth 2 (two) times daily.   furosemide 40 MG tablet Commonly known as: LASIX Take 1 tablet (40 mg total) by mouth in the morning. (0900)   gabapentin 100 MG capsule Commonly known as: NEURONTIN Take 100 mg by mouth at bedtime.   loratadine 10 MG tablet Commonly known as: CLARITIN Take 1 tablet (10 mg total) by mouth in the morning. (0900)   sitaGLIPtin 50 MG tablet Commonly known as: JANUVIA Take 1 tablet (50 mg total) by mouth in the morning. (0900)               Discharge Care Instructions  (From admission, onward)           Start     Ordered   01/03/21 0000  Discharge wound care:       Comments: Follow up Vascular and podiatry   01/03/21 1005   01/03/21 0000  Discharge wound care:       Comments: Follow-up vascular surgery and podiatry.   01/03/21 1058            Follow-up Information     Bernerd Limbo, MD Follow up in 1 week(s).   Specialty: Family Medicine Contact information: [address] [place] [address] [phone]         Evelina Bucy, DPM Follow up in 1 week(s).   Specialty: Podiatry Contact information: [address] [address] [phone]         [facility] Follow up in 1 week(s).   Contact information: [address] [place] [address] [phone]               Allergies  Allergen Reactions   Codeine Itching   Iodinated Diagnostic Agents     Other reaction(s): Unknown   Pentazocine Other (See Comments)    Hallucinations   Statins     Consultations: Vascular surgery Podiatry   Procedures/Studies: DG Tibia/Fibula Right  Result Date: 12/07/2020 CLINICAL DATA:  Leg and foot  swelling with erythema. Concern for fracture or osteomyelitis. Possible infection of venous stasis ulcer to right leg. EXAM: RIGHT TIBIA AND FIBULA - 2 VIEW COMPARISON:  None. FINDINGS: The bones are diffusely demineralized. There is no evidence of acute fracture, dislocation or bone destruction. Tricompartmental degenerative changes and meniscal chondrocalcinosis are noted at the knee. There mild degenerative changes within the ankle and visualized hindfoot. Diffuse vascular calcifications are noted. The soft tissues appear edematous, without evidence of soft tissue emphysema or foreign body. IMPRESSION: No radiographic evidence of osteomyelitis. Nonspecific diffuse soft tissue edema which could reflect cellulitis. Electronically Signed   By: Richardean Sale M.D.   On: 12/07/2020 13:52   MR BRAIN WO CONTRAST  Result Date: 12/21/2020 CLINICAL DATA:  Delirium.  Expressive aphasia. EXAM: MRI HEAD WITHOUT CONTRAST TECHNIQUE: Multiplanar, multiecho pulse sequences of the brain and surrounding structures were obtained without intravenous contrast. COMPARISON:  08/04/2020 FINDINGS: Brain: There is no evidence of an acute infarct, mass, midline shift, or extra-axial fluid collection. Chronic microhemorrhages are noted in the right pons and right lentiform nucleus. Widespread T2 hyperintensities are again seen in the cerebral white matter bilaterally which are nonspecific but compatible with extensive chronic small vessel ischemic disease. There are chronic lacunar infarcts in the cerebral white matter bilaterally and in  the right basal ganglia. Cystic foci are again noted in the globe I palate I suggestive of remote insults. There is mild cerebral atrophy. Vascular: Major intracranial vascular flow voids are preserved. Skull and upper cervical spine: Unremarkable bone marrow signal. Sinuses/Orbits: Unremarkable orbits. Minimal mucosal thickening in the paranasal sinuses. Clear mastoid air cells. Other: None. IMPRESSION: 1. No acute intracranial abnormality. 2. Extensive chronic small vessel ischemic disease. Electronically Signed   By: Logan Bores M.D.   On: 12/21/2020 15:18   MR HEEL RIGHT WO CONTRAST  Result Date: 12/19/2020 CLINICAL DATA:  Foot swelling and pain. EXAM: MR OF THE RIGHT HEEL WITHOUT CONTRAST TECHNIQUE: Multiplanar, multisequence MR imaging of the right ankle was performed. No intravenous contrast was administered. COMPARISON:  None. FINDINGS: Patient motion degrading image quality limiting evaluation. TENDONS Peroneal: Peroneal longus tendon intact. Peroneal brevis intact. Posteromedial: Posterior tibial tendon intact. Flexor hallucis longus tendon intact. Flexor digitorum longus tendon intact. Anterior: Tibialis anterior tendon intact. Extensor hallucis longus tendon intact Extensor digitorum longus tendon intact. Achilles:  Mild tendinosis of the distal Achilles tendon. Plantar Fascia: Intact. LIGAMENTS Lateral: Anterior talofibular ligament intact. Calcaneofibular ligament intact. Posterior talofibular ligament intact. Anterior and posterior tibiofibular ligaments intact. Medial: Deltoid ligament intact. Spring ligament intact. CARTILAGE Ankle Joint: No joint effusion. Partial-thickness cartilage loss of the tibiotalar joint. Subtalar Joints/Sinus Tarsi: Normal sinus tarsi. Partial-thickness cartilage loss of the subtalar joints. Bones: No marrow signal abnormality. No fracture or dislocation. Mild osteoarthritis of the talonavicular joint. Osteoarthritis of the navicular-medial cuneiform joint. Moderate  osteoarthritis of the second, third and fourth tarsometatarsal joints. Soft Tissue: No fluid collection or hematoma. Muscles are normal without edema or atrophy. Tarsal tunnel is normal. IMPRESSION: 1. Mild tendinosis of the distal Achilles tendon. 2. No evidence of osteomyelitis of the calcaneus. 3. Partial-thickness cartilage loss of the tibiotalar joint. 4. Mild osteoarthritis of the talonavicular joint. 5. Osteoarthritis of the navicular-medial cuneiform joint. Electronically Signed   By: Kathreen Devoid M.D.   On: 12/19/2020 21:18   DG Chest Port 1 View  Result Date: 12/18/2020 CLINICAL DATA:  Weakness.  History of chest trauma in May 22. EXAM: PORTABLE CHEST 1 VIEW COMPARISON:  Chest x-ray 12/03/2020, CT abdomen  pelvis 08/01/2020, CT chest 07/31/2020 FINDINGS: The heart and mediastinal contours are unchanged. Aortic calcification. No focal consolidation. No pulmonary edema. Interval decrease in size of a trace to small volume right pleural effusion. No pneumothorax. No acute osseous abnormality. Bilateral shoulder degenerative changes. Surgical hardware of the lower thoracic spine is again noted. IMPRESSION: 1. Interval decrease in size of a persistent trace to small volume right pleural effusion. 2.  Aortic Atherosclerosis (ICD10-I70.0). Electronically Signed   By: Iven Finn M.D.   On: 12/18/2020 15:12   DG Foot Complete Right  Result Date: 12/07/2020 CLINICAL DATA:  Right lower extremity swelling. EXAM: RIGHT FOOT COMPLETE - 3+ VIEW COMPARISON:  None. FINDINGS: There is no evidence of fracture or dislocation. Vascular calcifications are noted. Moderate posterior calcaneal spurring is noted. Degenerative changes are seen involving the tarsal and tarsometatarsal joints. No lytic destruction is seen to suggest osteomyelitis. IMPRESSION: Chronic findings as described above. No lytic destruction is seen to suggest osteomyelitis. Electronically Signed   By: Marijo Conception M.D.   On: 12/07/2020 13:51    VAS [place] ABI WITH/WO TBI  Result Date: 12/19/2020  LOWER EXTREMITY DOPPLER STUDY Patient Name:  RAYVON BRANDVOLD  Date of Exam:   12/19/2020 Medical Rec #: [id number]      Accession #:    [id number] Date of Birth: 1945/05/09       Patient Gender: F Patient Age:   41 years Exam Location:  [facility] Procedure:      VAS [place] ABI WITH/WO TBI Referring Phys: Terrilee Croak --------------------------------------------------------------------------------  Indications: Ulceration, and cellulitis. High Risk Factors: Hypertension, hyperlipidemia, Diabetes, no history of                    smoking. Other Factors: CKD3.  Comparison Study: No previous exams Performing Technologist: Hill, Jody RVT, RDMS  Examination Guidelines: A complete evaluation includes at minimum, Doppler waveform signals and systolic blood pressure reading at the level of bilateral brachial, anterior tibial, and posterior tibial arteries, when vessel segments are accessible. Bilateral testing is considered an integral part of a complete examination. Photoelectric Plethysmograph (PPG) waveforms and toe systolic pressure readings are included as required and additional duplex testing as needed. Limited examinations for reoccurring indications may be performed as noted.  ABI Findings: +---------+------------------+-----+----------+--------+ Right    Rt Pressure (mmHg)IndexWaveform  Comment  +---------+------------------+-----+----------+--------+ Brachial 105                    triphasic          +---------+------------------+-----+----------+--------+ PTA                             monophasic>254     +---------+------------------+-----+----------+--------+ DP                              monophasic>254     +---------+------------------+-----+----------+--------+ Great Toe                       Absent             +---------+------------------+-----+----------+--------+ +---------+------------------+-----+----------+-------+  Left     Lt Pressure (mmHg)IndexWaveform  Comment +---------+------------------+-----+----------+-------+ Brachial 110                    triphasic         +---------+------------------+-----+----------+-------+ PTA  monophasic>254    +---------+------------------+-----+----------+-------+ DP                              monophasic>254    +---------+------------------+-----+----------+-------+ Great Toe39                0.35 Abnormal  Severe  +---------+------------------+-----+----------+-------+ +-------+-----------+-----------+------------+------------+ ABI/TBIToday's ABIToday's TBIPrevious ABIPrevious TBI +-------+-----------+-----------+------------+------------+ Right  [place]         0                                   +-------+-----------+-----------+------------+------------+ Left   [place]         0.35                                +-------+-----------+-----------+------------+------------+ Arterial wall calcification precludes accurate ankle pressures and ABIs.  Summary: Right: Resting right ankle-brachial index indicates noncompressible right lower extremity arteries and monophasic waveforms. The right toe-brachial index is abnormal (absence of flow). Left: Resting left ankle-brachial index indicates noncompressible left lower extremity arteries and monophasic waveforms. The left toe-brachial index is abnormal (severe small vessel disease).  *See table(s) above for measurements and observations.  Vascular consult recommended. Electronically signed by Jamelle Haring on 12/19/2020 at 5:25:23 PM.    Final       Subjective: Patient was seen and examined at bedside.  Overnight events noted.   Patient reports feeling much improved.  She wants to be discharged. Patient is being discharged to skilled nursing facility and follow-up with vascular and podiatry outpatient.  Discharge Exam: Vitals:   01/03/21 0509 01/03/21 0947  BP: 139/61  (!) 141/62  Pulse: 62 66  Resp: 18 16  Temp: 97.8 F (36.6 C) 98 F (36.7 C)  SpO2: 98% 97%   Vitals:   01/02/21 1749 01/02/21 2031 01/03/21 0509 01/03/21 0947  BP: (!) 133/59 (!) 152/57 139/61 (!) 141/62  Pulse: 66 71 62 66  Resp: 16 16 18 16   Temp: 98.9 F (37.2 C) 98.4 F (36.9 C) 97.8 F (36.6 C) 98 F (36.7 C)  TempSrc: Oral Oral Oral Oral  SpO2: 98% 98% 98% 97%  Weight:  61.6 kg    Height:        General: Pt is alert, awake, not in acute distress Cardiovascular: RRR, S1/S2 +, no rubs, no gallops Respiratory: CTA bilaterally, no wheezing, no rhonchi Abdominal: Soft, NT, ND, bowel sounds + Extremities: Right heel ulcer, in una boots    The results of significant diagnostics from this hospitalization (including imaging, microbiology, ancillary and laboratory) are listed below for reference.     Microbiology: Recent Results (from the past 240 hour(s))  Resp Panel by RT-PCR (Flu A&B, Covid) Nasopharyngeal Swab     Status: None   Collection Time: 01/02/21 10:35 AM   Specimen: Nasopharyngeal Swab; Nasopharyngeal(NP) swabs in vial transport medium  Result Value Ref Range Status   SARS Coronavirus 2 by RT PCR NEGATIVE NEGATIVE Final    Comment: (NOTE) SARS-CoV-2 target nucleic acids are NOT DETECTED.  The SARS-CoV-2 RNA is generally detectable in upper respiratory specimens during the acute phase of infection. The lowest concentration of SARS-CoV-2 viral copies this assay can detect is 138 copies/mL. A negative result does not preclude SARS-Cov-2 infection and should not be used as the sole basis for treatment or other patient management decisions.  A negative result may occur with  improper specimen collection/handling, submission of specimen other than nasopharyngeal swab, presence of viral mutation(s) within the areas targeted by this assay, and inadequate number of viral copies(<138 copies/mL). A negative result must be combined with clinical observations,  patient history, and epidemiological information. The expected result is Negative.  Fact Sheet for Patients:  EntrepreneurPulse.com.au  Fact Sheet for Healthcare Providers:  IncredibleEmployment.be  This test is no t yet approved or cleared by the [place] FDA and  has been authorized for detection and/or diagnosis of SARS-CoV-2 by FDA under an Emergency Use Authorization (EUA). This EUA will remain  in effect (meaning this test can be used) for the duration of the COVID-19 declaration under Section 564(b)(1) of the Act, 21 U.S.C.section 360bbb-3(b)(1), unless the authorization is terminated  or revoked sooner.       Influenza A by PCR NEGATIVE NEGATIVE Final   Influenza B by PCR NEGATIVE NEGATIVE Final    Comment: (NOTE) The Xpert Xpress SARS-CoV-2/FLU/RSV plus assay is intended as an aid in the diagnosis of influenza from Nasopharyngeal swab specimens and should not be used as a sole basis for treatment. Nasal washings and aspirates are unacceptable for Xpert Xpress SARS-CoV-2/FLU/RSV testing.  Fact Sheet for Patients: EntrepreneurPulse.com.au  Fact Sheet for Healthcare Providers: IncredibleEmployment.be  This test is not yet approved or cleared by the [place] FDA and has been authorized for detection and/or diagnosis of SARS-CoV-2 by FDA under an Emergency Use Authorization (EUA). This EUA will remain in effect (meaning this test can be used) for the duration of the COVID-19 declaration under Section 564(b)(1) of the Act, 21 U.S.C. section 360bbb-3(b)(1), unless the authorization is terminated or revoked.  Performed at [facility], [place] [address] [place], [place] [address]      Labs: BNP (last 3 results) Recent Labs    12/07/20 1322  BNP 751.0*   Basic Metabolic Panel: Recent Labs  Lab 12/28/20 0327 12/29/20 0332 12/31/20 0403 01/01/21 0422 01/02/21 0305  01/03/21 0420  NA 137 136 135 137 133* 137  K 4.2 4.4 4.1 4.1 3.8 3.9  CL 110 110 107 110 106 109  CO2 20* 20* 21* 21* 20* 22  GLUCOSE 99 99 119* 89 87 90  BUN 33* 35* 31* 31* 31* 29*  CREATININE 1.32* 1.34* 1.28* 1.31* 1.25* 1.19*  CALCIUM 9.2 9.1 9.3 9.4 9.3 9.3  MG 1.8  --   --  1.7 1.6* 1.9  PHOS 3.5  --   --  3.7 3.4 3.5   Liver Function Tests: Recent Labs  Lab 12/28/20 0327 01/02/21 0305  AST 15 22  ALT 10 12  ALKPHOS 108 113  BILITOT 0.4 0.7  PROT 5.8* 6.0*  ALBUMIN 1.9* 2.3*   No results for input(s): LIPASE, AMYLASE in the last 168 hours. No results for input(s): AMMONIA in the last 168 hours. CBC: Recent Labs  Lab 12/28/20 0327 12/29/20 0332 12/31/20 0403 01/01/21 0422 01/02/21 0305 01/03/21 0420  WBC 7.0 6.9 6.3 6.4 6.7 5.8  NEUTROABS 4.6  --   --   --  4.7  --   HGB 9.4* 8.8* 9.6* 9.8* 9.6* 9.8*  HCT 30.3* 28.2* 30.5* 30.9* 31.6* 31.6*  MCV 79.9* 79.7* 79.2* 79.8* 81.2 80.8  PLT 238 264 255 289 280 269   Cardiac Enzymes: No results for input(s): CKTOTAL, CKMB, CKMBINDEX, TROPONINI in the last 168 hours. BNP: Invalid input(s): POCBNP CBG: Recent Labs  Lab 01/02/21 1129 01/02/21 1627 01/02/21  2126 01/03/21 0652 01/03/21 1109  GLUCAP 90 93 95 88 91   D-Dimer No results for input(s): DDIMER in the last 72 hours. Hgb A1c No results for input(s): HGBA1C in the last 72 hours. Lipid Profile No results for input(s): CHOL, HDL, LDLCALC, TRIG, CHOLHDL, LDLDIRECT in the last 72 hours. Thyroid function studies No results for input(s): TSH, T4TOTAL, T3FREE, THYROIDAB in the last 72 hours.  Invalid input(s): FREET3 Anemia work up No results for input(s): VITAMINB12, FOLATE, FERRITIN, TIBC, IRON, RETICCTPCT in the last 72 hours. Urinalysis    Component Value Date/Time   COLORURINE YELLOW 12/21/2020 1157   APPEARANCEUR CLEAR 12/21/2020 1157   LABSPEC 1.012 12/21/2020 1157   PHURINE 5.0 12/21/2020 1157   GLUCOSEU NEGATIVE 12/21/2020 1157   HGBUR  NEGATIVE 12/21/2020 [place] 12/21/2020 [place] 12/21/2020 1157   PROTEINUR 30 (A) 12/21/2020 1157   UROBILINOGEN 0.2 08/29/2011 1820   NITRITE NEGATIVE 12/21/2020 1157   LEUKOCYTESUR NEGATIVE 12/21/2020 1157   Sepsis Labs Invalid input(s): PROCALCITONIN,  WBC,  LACTICIDVEN Microbiology Recent Results (from the past 240 hour(s))  Resp Panel by RT-PCR (Flu A&B, Covid) Nasopharyngeal Swab     Status: None   Collection Time: 01/02/21 10:35 AM   Specimen: Nasopharyngeal Swab; Nasopharyngeal(NP) swabs in vial transport medium  Result Value Ref Range Status   SARS Coronavirus 2 by RT PCR NEGATIVE NEGATIVE Final    Comment: (NOTE) SARS-CoV-2 target nucleic acids are NOT DETECTED.  The SARS-CoV-2 RNA is generally detectable in upper respiratory specimens during the acute phase of infection. The lowest concentration of SARS-CoV-2 viral copies this assay can detect is 138 copies/mL. A negative result does not preclude SARS-Cov-2 infection and should not be used as the sole basis for treatment or other patient management decisions. A negative result may occur with  improper specimen collection/handling, submission of specimen other than nasopharyngeal swab, presence of viral mutation(s) within the areas targeted by this assay, and inadequate number of viral copies(<138 copies/mL). A negative result must be combined with clinical observations, patient history, and epidemiological information. The expected result is Negative.  Fact Sheet for Patients:  EntrepreneurPulse.com.au  Fact Sheet for Healthcare Providers:  IncredibleEmployment.be  This test is no t yet approved or cleared by the [place] FDA and  has been authorized for detection and/or diagnosis of SARS-CoV-2 by FDA under an Emergency Use Authorization (EUA). This EUA will remain  in effect (meaning this test can be used) for the duration of  the COVID-19 declaration under Section 564(b)(1) of the Act, 21 U.S.C.section 360bbb-3(b)(1), unless the authorization is terminated  or revoked sooner.       Influenza A by PCR NEGATIVE NEGATIVE Final   Influenza B by PCR NEGATIVE NEGATIVE Final    Comment: (NOTE) The Xpert Xpress SARS-CoV-2/FLU/RSV plus assay is intended as an aid in the diagnosis of influenza from Nasopharyngeal swab specimens and should not be used as a sole basis for treatment. Nasal washings and aspirates are unacceptable for Xpert Xpress SARS-CoV-2/FLU/RSV testing.  Fact Sheet for Patients: EntrepreneurPulse.com.au  Fact Sheet for Healthcare Providers: IncredibleEmployment.be  This test is not yet approved or cleared by the [place] FDA and has been authorized for detection and/or diagnosis of SARS-CoV-2 by FDA under an Emergency Use Authorization (EUA). This EUA will remain in effect (meaning this test can be used) for the duration of the COVID-19 declaration under Section 564(b)(1) of the Act, 21 U.S.C. section 360bbb-3(b)(1), unless the authorization is terminated  or revoked.  Performed at [facility], [place] [address] [place], [place] [address]      Time coordinating discharge: Over 30 minutes  SIGNED:   Shawna Clamp, MD  [facility] 01/03/2021, 11:14 AM Pager   If 7PM-7AM, please contact night-coverage

## 2021-01-03 NOTE — Plan of Care (Signed)
  Problem: Health Behavior/Discharge Planning: Goal: Ability to manage health-related needs will improve Outcome: Progressing   Problem: Clinical Measurements: Goal: Ability to maintain clinical measurements within normal limits will improve Outcome: Progressing   Problem: Elimination: Goal: Will not experience complications related to bowel motility Outcome: Progressing Goal: Will not experience complications related to urinary retention Outcome: Progressing

## 2021-01-03 NOTE — TOC Progression Note (Addendum)
Transition of Care [facility]) - Progression Note    Patient Details  Name: Jacqueline Richmond MRN: [id number] Date of Birth: 14-Aug-1945  Transition of Care [facility]) CM/SW Contact  [name], LCSW Phone Number: 01/03/2021, 11:07 AM  Clinical Narrative:  CSW received a call from  Ingram, admissions Mudlogger at [facility]. She received a call from someone with the company [employer] regarding patient (CSW was informed by daughter-in-law [name] that they were working with this company). She requested that CSW send her patient's clinicals (they had been sent previously, however this facility declined), and clinicals resent. CSW will be contacted by [facility] regarding their decision on patient. Cherie informed that insurance authorization submitted and received, as daughter agreed on the one facility that made a bed offer.    Expected Discharge Plan: [place] Barriers to Discharge: Continued Medical Work up, Ship broker  Expected Discharge Plan and Services Expected Discharge Plan: [place] arrangements for the past 2 months: Single Family Home Expected Discharge Date: 01/03/21                                     Social Determinants of Health (SDOH) Interventions  None needed or requested at this time.  Readmission Risk Interventions No flowsheet data found.

## 2021-01-03 NOTE — Progress Notes (Signed)
Physical Therapy Treatment Patient Details Name: Jacqueline Richmond MRN: [id number] DOB: Apr 12, 1945 Today's Date: 01/03/2021   History of Present Illness 75 y.o. female presents to [facility] hospital on 12/18/2020 from podiatry with worsening R heel wound. PMH includes T2DM, HTN, HLD, CKD stage IV, gout, diverticulosis recently hospitalized 6/9-08/2012 for wound debridement and application of wound VAC on a previously operated T10 fracture site, discharged to SNF for 2 months prior to returning home.    PT Comments    Pt tolerates treatment well, performing multiple transfers and therapeutic exercise. Pt continues to demonstrate LE weakness, benefiting from physical assistance from PT for all aspects of functional mobility at this time. Pt demonstrates a posterior lean in standing. Pt will benefit from continued aggressive mobilization to improve mobility quality and reduce falls risk. PT continues to recommend SNF placement.   Recommendations for follow up therapy are one component of a multi-disciplinary discharge planning process, led by the attending physician.  Recommendations may be updated based on patient status, additional functional criteria and insurance authorization.  Follow Up Recommendations  SNF     Equipment Recommendations   (defer to post-acute)    Recommendations for Other Services       Precautions / Restrictions Precautions Precautions: Fall Restrictions Weight Bearing Restrictions: No     Mobility  Bed Mobility Overal bed mobility: Needs Assistance Bed Mobility: Rolling;Sidelying to Sit Rolling: Min guard Sidelying to sit: Mod assist       General bed mobility comments: pt requires assistance to elevate trunk and to pivot hips to edge of bed to allow feet to touch floor    Transfers Overall transfer level: Needs assistance Equipment used: Rolling walker (2 wheeled);1 person hand held assist Transfers: Sit to/from [facility] Sit to Stand: Mod  assist Stand pivot transfers: Max assist       General transfer comment: pt requires assistance to power into standing, maintains a flexed posture which is slightly improved with use of RW vs PT hand hold. Pt completes 5 sit to stands and one SPT  Ambulation/Gait Ambulation/Gait assistance: Mod assist Gait Distance (Feet): 0 Feet (pt takes one step with left and right foot in place without advancing. Reports discomfort with SLS) Assistive device: Rolling walker (2 wheeled) Gait Pattern/deviations: Step-to pattern Gait velocity: 0 Gait velocity interpretation: <1.31 ft/sec, indicative of household ambulator General Gait Details: one step in place with each foot   Stairs             Wheelchair Mobility    Modified Rankin (Stroke Patients Only)       Balance Overall balance assessment: Needs assistance Sitting-balance support: Single extremity supported;Bilateral upper extremity supported;Feet supported Sitting balance-Leahy Scale: Poor Sitting balance - Comments: pt reliant on UE support, posterior lean Postural control: Posterior lean   Standing balance-Leahy Scale: Poor Standing balance comment: min-modA with BUE support of RW                            Cognition Arousal/Alertness: Awake/alert Behavior During Therapy: WFL for tasks assessed/performed Overall Cognitive Status: Impaired/Different from baseline Area of Impairment: Problem solving                             Problem Solving: Slow processing        Exercises General Exercises - Lower Extremity Ankle Circles/Pumps: AROM;Both;10 reps Gluteal Sets: AROM;Both;10 reps Long Arc Quad: AROM;Both;10 reps Hip  ABduction/ADduction: AROM;Both;10 reps;Seated Hip Flexion/Marching: AROM;Both;10 reps    General Comments General comments (skin integrity, edema, etc.): VSS on RA, unna boots in place, no bleeding noted on dressings      Pertinent Vitals/Pain Pain Assessment:  Faces Faces Pain Scale: Hurts even more Pain Location: BLE Pain Descriptors / Indicators: Grimacing Pain Intervention(s): Monitored during session    Home Living                      Prior Function            PT Goals (current goals can now be found in the care plan section) Acute Rehab PT Goals Patient Stated Goal: to go to rehab and improve mobility, return to walking Progress towards PT goals: Progressing toward goals    Frequency    Min 2X/week      PT Plan Current plan remains appropriate    Co-evaluation              AM-PAC PT "6 Clicks" Mobility   Outcome Measure  Help needed turning from your back to your side while in a flat bed without using bedrails?: A Little Help needed moving from lying on your back to sitting on the side of a flat bed without using bedrails?: A Lot Help needed moving to and from a bed to a chair (including a wheelchair)?: A Lot Help needed standing up from a chair using your arms (e.g., wheelchair or bedside chair)?: A Lot Help needed to walk in hospital room?: Total Help needed climbing 3-5 steps with a railing? : Total 6 Click Score: 11    End of Session   Activity Tolerance: Patient tolerated treatment well Patient left: in chair;with call bell/phone within reach;with chair alarm set Nurse Communication: Mobility status;Need for lift equipment (STEDY for transfers) PT Visit Diagnosis: Other abnormalities of gait and mobility (R26.89);Muscle weakness (generalized) (M62.81);Pain Pain - part of body: Leg     Time: 0786-7544 PT Time Calculation (min) (ACUTE ONLY): 23 min  Charges:  $Therapeutic Exercise: 8-22 mins $Therapeutic Activity: 8-22 mins                     Zenaida Niece, PT, DPT Acute Rehabilitation Services Pager: [phone] Office [phone]  Zenaida Niece 01/03/2021, 12:22 PM

## 2021-01-03 NOTE — Progress Notes (Signed)
DISCHARGE NOTE HOME IVANELL DESHOTEL to be discharged Skilled nursing facility per MD order. Discussed prescriptions and follow up appointments with the patient. Prescriptions given to patient; medication list explained in detail. Patient verbalized understanding.  Skin clean, dry and intact without evidence of skin break down, no evidence of skin tears noted. IV catheter discontinued intact. Site without signs and symptoms of complications. Dressing and pressure applied. Pt denies pain at the site currently. No complaints noted.  Patient free of lines, drains, and wounds.   An After Visit Summary (AVS) was printed and placed in packet for receiving unit. Patient escorted via stretcher, and discharged to [facility] via [place].  Vira Agar, RN

## 2021-01-03 NOTE — TOC Transition Note (Signed)
Transition of Care [facility]) - CM/SW Discharge Note   Patient Details  Name: YEIRA GULDEN MRN: [id number] Date of Birth: 04-Aug-1945  Transition of Care [facility]) CM/SW Contact:  Sable Feil, LCSW Phone Number: 01/03/2021, 2:08 PM   Clinical Narrative:  Patient medically stable for discharge ad going to [facility] in [place]. Family was working with [employer] Furn[employer] conservator/restorer [facility]) and was able to secure a bed at [facility]. CSW was contacted by admissions director Cherie [phone]) regarding patient and informed that they can accept Ms. Bina today. Cherie informed that patient has insurance authorization and this information provided to her. Discharge clinicals transmitted to facility and ambulance transport arranged. Talked with patient's son Corene Cornea [phone]) regarding discharge and his mother going to [facility] today.       Final next level of care: [facility]) Barriers to Discharge: Barriers Resolved   Patient Goals and CMS Choice Patient states their goals for this hospitalization and ongoing recovery are:: Patient wats to go to rehab to get stronger before returning home CMS Medicare.gov Compare Post Acute Care list provided to:: Patient Represenative (must comment) (Talked with daughter-in-law [name] regarding [employer]) Choice offered to / list presented to :  (Provided to daughter-in-law [name])  Discharge Placement   Existing PASRR number confirmed : 12/22/20          Patient chooses bed at: [facility] Patient to be transferred to facility by: Non-emergency ambulance transport Name of family member notified: Son Enrique Sack - [phone] Patient and family notified of of transfer: 01/03/21  Discharge Plan and Services  Discharging to [facility] in [place] for Short-Term rehab                                   Social Determinants of Health (SDOH) Interventions  None  needed or requested at discharge.   Readmission Risk Interventions No flowsheet data found.

## 2021-01-04 ENCOUNTER — Ambulatory Visit: Payer: Medicare PPO | Admitting: Pulmonary Disease

## 2021-01-09 ENCOUNTER — Telehealth: Payer: Self-pay | Admitting: Pulmonary Disease

## 2021-01-09 NOTE — Telephone Encounter (Signed)
I have called and LM on VM for Jacqueline Richmond to call the office back.

## 2021-01-09 NOTE — Telephone Encounter (Signed)
Ok to delay follow up. I was not involved with her care in the hospital. Recommend they get labs as recommended. I do not see why they could not be checked at her current location. But they need to be routed to the appropriate provider, physician in charge at [facility] or her PCP.

## 2021-01-09 NOTE — Telephone Encounter (Signed)
Left message for Jacqueline Richmond to call back

## 2021-01-09 NOTE — Telephone Encounter (Signed)
Jacqueline Richmond is returning phone call. Jacqueline Richmond phone number is [phone].

## 2021-01-09 NOTE — Telephone Encounter (Signed)
I called and spoke with [name] and she is wanting to see if the appt for 10/25 can be pushed back some and can they get her labs done at [facility] in [place]?   She stated that it is a little difficult to get the pt around right now but she is doing much better.  [name] is aware that I will check with MH and will call her back and let her know.    Hospital D/C stated that she needed to have BMP and CBC done in 1 week.  MH please advise. thanks

## 2021-01-10 NOTE — Telephone Encounter (Signed)
LMTCB again for [facility]

## 2021-01-10 NOTE — Telephone Encounter (Signed)
Spoke with [name] and notified of response per Dr Silas Flood. She verbalized understanding. Nothing further needed.

## 2021-01-10 NOTE — Telephone Encounter (Signed)
Jacqueline Richmond is returning phone call. Jacqueline Richmond phone number is [phone].

## 2021-01-15 ENCOUNTER — Ambulatory Visit: Payer: Medicare PPO | Admitting: Pulmonary Disease

## 2021-01-17 ENCOUNTER — Encounter (HOSPITAL_BASED_OUTPATIENT_CLINIC_OR_DEPARTMENT_OTHER): Payer: Medicare PPO | Admitting: Internal Medicine

## 2021-01-18 ENCOUNTER — Encounter: Payer: Self-pay | Admitting: Podiatry

## 2021-01-18 ENCOUNTER — Other Ambulatory Visit: Payer: Self-pay

## 2021-01-18 ENCOUNTER — Ambulatory Visit: Payer: Medicare PPO | Admitting: Podiatry

## 2021-01-18 DIAGNOSIS — I739 Peripheral vascular disease, unspecified: Secondary | ICD-10-CM

## 2021-01-18 DIAGNOSIS — R601 Generalized edema: Secondary | ICD-10-CM | POA: Diagnosis not present

## 2021-01-18 DIAGNOSIS — L539 Erythematous condition, unspecified: Secondary | ICD-10-CM | POA: Diagnosis not present

## 2021-01-18 MED ORDER — DOXYCYCLINE HYCLATE 100 MG PO TABS: 100.0000 mg | ORAL_TABLET | Freq: Two times a day (BID) | ORAL | 0 refills | Status: AC

## 2021-01-18 NOTE — Progress Notes (Signed)
Subjective:  Patient ID: Jacqueline Richmond, female    DOB: 11/27/1945,  MRN: [id number]  Chief Complaint  Patient presents with   Wound Check    Heel ulcer     75 y.o. female presents with the above complaint.  Patient presents with a new complaint of right dorsal foot mild erythema with edema.  Patient states that this has been going for quite some time.  She wanted to get it evaluated.  She is known to Dr. Earleen Newport who saw her few months ago for ulceration to the right heel which has healed now.  She continues states that nursing is coming doing the boot changes to bilateral leg due to severe edema.  She would like to know if she can extend it for another 2 months as it is very helpful.  She has not seen MRIs prior to seeing me.  She denies any other acute complaints.   Review of Systems: Negative except as noted in the HPI. Denies N/V/F/Ch.  Past Medical History:  Diagnosis Date   Allergy    Anemia    Arthritis    Cataract    biltaerally    CKD (chronic kidney disease) stage 3, GFR 30-59 ml/min ([place]) 08/29/2011   per patient, kidneys monitored by Dr Posey Pronto endocrinologist ,lov was  05-18-2018, "kidney fx stable"   Colon polyp    Complication of anesthesia    Difficult intubation    small mouth opening   Diverticulosis    Gout    HTN (hypertension) 08/29/2011   Hyperlipemia    Psoriasis    Type II or unspecified type diabetes mellitus without mention of complication, not stated as uncontrolled 08/29/2011    Current Outpatient Medications:    doxycycline (VIBRA-TABS) 100 MG tablet, Take 1 tablet (100 mg total) by mouth 2 (two) times daily for 10 days., Disp: 20 tablet, Rfl: 0   allopurinol (ZYLOPRIM) 100 MG tablet, Take 0.5 tablets (50 mg total) by mouth every other day. TAKE 1/2 TABLET (50MG ) BY MOUTH EVERY OTHER DAY FOR GOUT, Disp: 7.5 tablet, Rfl: 0   aspirin EC 81 MG tablet, Take 81 mg by mouth daily. Swallow whole., Disp: , Rfl:    atenolol (TENORMIN) 100 MG tablet, Take 1 tablet (100  mg total) by mouth in the morning. (0900), Disp: 30 tablet, Rfl: 0   cloNIDine (CATAPRES) 0.2 MG tablet, Take 1 tablet (0.2 mg total) by mouth 2 (two) times daily., Disp: 60 tablet, Rfl: 0   furosemide (LASIX) 40 MG tablet, Take 1 tablet (40 mg total) by mouth in the morning. (0900), Disp: 30 tablet, Rfl: 0   gabapentin (NEURONTIN) 100 MG capsule, Take 100 mg by mouth at bedtime., Disp: , Rfl:    loratadine (CLARITIN) 10 MG tablet, Take 1 tablet (10 mg total) by mouth in the morning. (0900), Disp: 30 tablet, Rfl: 0   sitaGLIPtin (JANUVIA) 50 MG tablet, Take 1 tablet (50 mg total) by mouth in the morning. (0900), Disp: 30 tablet, Rfl: 0  Social History   Tobacco Use  Smoking Status Never  Smokeless Tobacco Never    Allergies  Allergen Reactions   Codeine Itching   Iodinated Diagnostic Agents     Other reaction(s): Unknown   Pentazocine Other (See Comments)    Hallucinations   Statins    Objective:  There were no vitals filed for this visit. There is no height or weight on file to calculate BMI. Constitutional Well developed. Well nourished.  Vascular Dorsalis pedis pulses palpable  bilaterally. Posterior tibial pulses palpable bilaterally. Capillary refill normal to all digits.  No cyanosis or clubbing noted. Pedal hair growth normal.  Neurologic Normal speech. Oriented to person, place, and time. Epicritic sensation to light touch grossly present bilaterally.  Dermatologic Mild erythema noted to the right dorsal foot skin irritation versus infectious erythema.  No open wounds or lesions noted.  No area of fluctuance or crepitus appreciated.  Bilateral leg improving edema noted nonpitting  Orthopedic: Normal joint ROM without pain or crepitus bilaterally. No visible deformities. No bony tenderness.   Radiographs: None Assessment:   1. PAD (peripheral artery disease) ([place])   2. Generalized edema   3. Erythema    Plan:  Patient was evaluated and treated and all questions  answered.  Right dorsal foot erythema mild without underlying abscess -I explained to the patient the etiology of erythema and various treatment options were discussed.  Given the amount of pain that she is having and the redness is present I believe she would benefit from 10-day course of doxycycline.  She states understand will start taking it and complete the course.  If there is no improvement the redness I will asked her to come see me right away.  She states understanding  Right leg edema -I explained the patient the etiology of edema and various treatment options were discussed.  She is having home nursing do bilateral negative Unna boot compression therapy.  It seems to be improving.  I reapplied the Unna boot today in standard technique without complication.  No follow-ups on file.

## 2021-01-28 ENCOUNTER — Ambulatory Visit: Payer: Medicare PPO | Admitting: Cardiology

## 2021-02-04 ENCOUNTER — Other Ambulatory Visit: Payer: Self-pay

## 2021-02-04 ENCOUNTER — Encounter: Payer: Self-pay | Admitting: Surgery

## 2021-02-04 ENCOUNTER — Ambulatory Visit: Payer: Medicare PPO | Admitting: Surgery

## 2021-02-04 VITALS — BP 121/75 | HR 76 | Temp 98.0°F | Resp 20 | Ht 64.0 in | Wt 135.0 lb

## 2021-02-04 DIAGNOSIS — I7025 Atherosclerosis of native arteries of other extremities with ulceration: Secondary | ICD-10-CM

## 2021-02-04 NOTE — Progress Notes (Signed)
[facility] [place]  Patient name: Jacqueline Richmond MRN: [id number] DOB: 1945/06/01 Sex: female   REASON FOR VISIT:    Follow up  HISOTRY OF PRESENT ILLNESS:    Jacqueline Richmond is a 75 y.o. female who was seen by Dr. Stanford Breed in the emergency department on 12/20/2020 for a right heel wound.  The patient has been followed by Dr. Earleen Newport with podiatry as well.  At that time, the patient was unable to communicate and answer questions.  MRI showed no evidence of osteomyelitis of the calcaneus.  ABIs were noncompressible.  The right toe pressure was absent and the left toe pressure was 39.  Because the wounds appear to be venous in origin with the exception of the heel ulcer, the patient was placed into an Unna boot's, and heel offloading was performed.  The patient was felt to not be a good candidate for revascularization.  She is here today for follow-up.  Her heel wound has resolved.  She still has swelling and drainage on the top of her foot and a second toe ulcer on the right she tells me that she does ambulate daily.  Patient is medically managed for diabetes and hypertension.  She has stage IV renal insufficiency. PAST MEDICAL HISTORY:   Past Medical History:  Diagnosis Date   Allergy    Anemia    Arthritis    Cataract    biltaerally    CKD (chronic kidney disease) stage 3, GFR 30-59 ml/min (HCC) 08/29/2011   per patient, kidneys monitored by Dr Posey Pronto endocrinologist ,lov was  05-18-2018, "kidney fx stable"   Colon polyp    Complication of anesthesia    Difficult intubation    small mouth opening   Diverticulosis    Gout    HTN (hypertension) 08/29/2011   Hyperlipemia    Psoriasis    Type II or unspecified type diabetes mellitus without mention of complication, not stated as uncontrolled 08/29/2011     FAMILY HISTORY:   Family History  Problem Relation Age of Onset   Heart disease Father    Arthritis Sister    Colon cancer Neg  Hx    Colon polyps Neg Hx    Esophageal cancer Neg Hx    Stomach cancer Neg Hx    Rectal cancer Neg Hx     SOCIAL HISTORY:   Social History   Tobacco Use   Smoking status: Never   Smokeless tobacco: Never  Substance Use Topics   Alcohol use: Yes    Comment: Drinks on Special Occasions     ALLERGIES:   Allergies  Allergen Reactions   Codeine Itching   Iodinated Diagnostic Agents     Other reaction(s): Unknown   Pentazocine Other (See Comments)    Hallucinations   Statins      CURRENT MEDICATIONS:   Current Outpatient Medications  Medication Sig Dispense Refill   allopurinol (ZYLOPRIM) 100 MG tablet Take 0.5 tablets (50 mg total) by mouth every other day. TAKE 1/2 TABLET (50MG ) BY MOUTH EVERY OTHER DAY FOR GOUT 7.5 tablet 0   aspirin EC 81 MG tablet Take 81 mg by mouth daily. Swallow whole.     atenolol (TENORMIN) 100 MG tablet Take 1 tablet (100 mg total) by mouth in the morning. (0900) 30 tablet 0   cloNIDine (CATAPRES) 0.2 MG tablet Take 1 tablet (0.2 mg total) by mouth 2 (two) times daily. 60 tablet 0   furosemide (LASIX) 40 MG tablet Take 1  tablet (40 mg total) by mouth in the morning. (0900) 30 tablet 0   gabapentin (NEURONTIN) 100 MG capsule Take 100 mg by mouth at bedtime.     loratadine (CLARITIN) 10 MG tablet Take 1 tablet (10 mg total) by mouth in the morning. (0900) 30 tablet 0   sitaGLIPtin (JANUVIA) 50 MG tablet Take 1 tablet (50 mg total) by mouth in the morning. (0900) 30 tablet 0   No current facility-administered medications for this visit.    REVIEW OF SYSTEMS:   [X]  denotes positive finding, [ ]  denotes negative finding Cardiac  Comments:  Chest pain or chest pressure:    Shortness of breath upon exertion:    Short of breath when lying flat:    Irregular heart rhythm:        Vascular    Pain in calf, thigh, or hip brought on by ambulation:    Pain in feet at night that wakes you up from your sleep:     Blood clot in your veins:    Leg  swelling:  x       Pulmonary    Oxygen at home:    Productive cough:     Wheezing:         Neurologic    Sudden weakness in arms or legs:     Sudden numbness in arms or legs:     Sudden onset of difficulty speaking or slurred speech:    Temporary loss of vision in one eye:     Problems with dizziness:         Gastrointestinal    Blood in stool:     Vomited blood:         Genitourinary    Burning when urinating:     Blood in urine:        Psychiatric    Major depression:         Hematologic    Bleeding problems:    Problems with blood clotting too easily:        Skin    Rashes or ulcers:        Constitutional    Fever or chills:      PHYSICAL EXAM:   There were no vitals filed for this visit.  GENERAL: The patient is a well-nourished female, in no acute distress. The vital signs are documented above. CARDIAC: There is a regular rate and rhythm.  VASCULAR: Nonpalpable pedal pulses PULMONARY: Non-labored respirations MUSCULOSKELETAL: There are no major deformities or cyanosis. NEUROLOGIC: No focal weakness or paresthesias are detected. SKIN: See photo below PSYCHIATRIC: The patient has a normal affect.   STUDIES:   I have reviewed the following studies: +-------+-----------+-----------+------------+------------+  ABI/TBIToday's ABIToday's TBIPrevious ABIPrevious TBI  +-------+-----------+-----------+------------+------------+  Right  [place]         0                                    +-------+-----------+-----------+------------+------------+  Left   [place]         0.35                                 +-------+-----------+-----------+------------+------------+ Right great toe pressure absent, left great toe pressure 39 MEDICAL ISSUES:   Complaint lower extremity wounds: The patient is much more interactive today.  She also states that she is ambulatory.  She  has wounds on her right foot that have not healed..  In addition, she has a fair amount  of edema.  I think the first order of business is to evaluate her blood flow.  I have her scheduled for angiography on 22 November via left femoral approach.  This will need to be with CO2 and contrast as she has renal disease.  I did discuss the risk of worsening renal disease with the use of contrast.  I am also [place] place her in a Unna boot to help with the leg swelling.  All risks and benefits were discussed with the patient who is here with her caregiver.  I also discussed that this is a limb threatening situation if we cannot get her wounds to heal.    Leia Alf, MD, FACS [facility] [phone] Pager [phone]

## 2021-02-04 NOTE — H&P (View-Only) (Signed)
[facility]in Specialist of [place]  Patient name: Jacqueline Richmond MRN: [id number] DOB: 10-19-1945 Sex: female   REASON FOR VISIT:    Follow up  HISOTRY OF PRESENT ILLNESS:    Jacqueline Richmond is a 75 y.o. female who was seen by Dr. Stanford Breed in the emergency department on 12/20/2020 for a right heel wound.  The patient has been followed by Dr. Earleen Newport with podiatry as well.  At that time, the patient was unable to communicate and answer questions.  MRI showed no evidence of osteomyelitis of the calcaneus.  ABIs were noncompressible.  The right toe pressure was absent and the left toe pressure was 39.  Because the wounds appear to be venous in origin with the exception of the heel ulcer, the patient was placed into an Unna boot's, and heel offloading was performed.  The patient was felt to not be a good candidate for revascularization.  She is here today for follow-up.  Her heel wound has resolved.  She still has swelling and drainage on the top of her foot and a second toe ulcer on the right she tells me that she does ambulate daily.  Patient is medically managed for diabetes and hypertension.  She has stage IV renal insufficiency. PAST MEDICAL HISTORY:   Past Medical History:  Diagnosis Date   Allergy    Anemia    Arthritis    Cataract    biltaerally    CKD (chronic kidney disease) stage 3, GFR 30-59 ml/min (HCC) 08/29/2011   per patient, kidneys monitored by Dr Posey Pronto endocrinologist ,lov was  05-18-2018, "kidney fx stable"   Colon polyp    Complication of anesthesia    Difficult intubation    small mouth opening   Diverticulosis    Gout    HTN (hypertension) 08/29/2011   Hyperlipemia    Psoriasis    Type II or unspecified type diabetes mellitus without mention of complication, not stated as uncontrolled 08/29/2011     FAMILY HISTORY:   Family History  Problem Relation Age of Onset   Heart disease Father    Arthritis Sister    Colon cancer Neg  Hx    Colon polyps Neg Hx    Esophageal cancer Neg Hx    Stomach cancer Neg Hx    Rectal cancer Neg Hx     SOCIAL HISTORY:   Social History   Tobacco Use   Smoking status: Never   Smokeless tobacco: Never  Substance Use Topics   Alcohol use: Yes    Comment: Drinks on Special Occasions     ALLERGIES:   Allergies  Allergen Reactions   Codeine Itching   Iodinated Diagnostic Agents     Other reaction(s): Unknown   Pentazocine Other (See Comments)    Hallucinations   Statins      CURRENT MEDICATIONS:   Current Outpatient Medications  Medication Sig Dispense Refill   allopurinol (ZYLOPRIM) 100 MG tablet Take 0.5 tablets (50 mg total) by mouth every other day. TAKE 1/2 TABLET (50MG ) BY MOUTH EVERY OTHER DAY FOR GOUT 7.5 tablet 0   aspirin EC 81 MG tablet Take 81 mg by mouth daily. Swallow whole.     atenolol (TENORMIN) 100 MG tablet Take 1 tablet (100 mg total) by mouth in the morning. (0900) 30 tablet 0   cloNIDine (CATAPRES) 0.2 MG tablet Take 1 tablet (0.2 mg total) by mouth 2 (two) times daily. 60 tablet 0   furosemide (LASIX) 40 MG tablet Take 1  tablet (40 mg total) by mouth in the morning. (0900) 30 tablet 0   gabapentin (NEURONTIN) 100 MG capsule Take 100 mg by mouth at bedtime.     loratadine (CLARITIN) 10 MG tablet Take 1 tablet (10 mg total) by mouth in the morning. (0900) 30 tablet 0   sitaGLIPtin (JANUVIA) 50 MG tablet Take 1 tablet (50 mg total) by mouth in the morning. (0900) 30 tablet 0   No current facility-administered medications for this visit.    REVIEW OF SYSTEMS:   [X]  denotes positive finding, [ ]  denotes negative finding Cardiac  Comments:  Chest pain or chest pressure:    Shortness of breath upon exertion:    Short of breath when lying flat:    Irregular heart rhythm:        Vascular    Pain in calf, thigh, or hip brought on by ambulation:    Pain in feet at night that wakes you up from your sleep:     Blood clot in your veins:    Leg  swelling:  x       Pulmonary    Oxygen at home:    Productive cough:     Wheezing:         Neurologic    Sudden weakness in arms or legs:     Sudden numbness in arms or legs:     Sudden onset of difficulty speaking or slurred speech:    Temporary loss of vision in one eye:     Problems with dizziness:         Gastrointestinal    Blood in stool:     Vomited blood:         Genitourinary    Burning when urinating:     Blood in urine:        Psychiatric    Major depression:         Hematologic    Bleeding problems:    Problems with blood clotting too easily:        Skin    Rashes or ulcers:        Constitutional    Fever or chills:      PHYSICAL EXAM:   There were no vitals filed for this visit.  GENERAL: The patient is a well-nourished female, in no acute distress. The vital signs are documented above. CARDIAC: There is a regular rate and rhythm.  VASCULAR: Nonpalpable pedal pulses PULMONARY: Non-labored respirations MUSCULOSKELETAL: There are no major deformities or cyanosis. NEUROLOGIC: No focal weakness or paresthesias are detected. SKIN: See photo below PSYCHIATRIC: The patient has a normal affect.   STUDIES:   I have reviewed the following studies: +-------+-----------+-----------+------------+------------+  ABI/TBIToday's ABIToday's TBIPrevious ABIPrevious TBI  +-------+-----------+-----------+------------+------------+  Right  [place]         0                                    +-------+-----------+-----------+------------+------------+  Left   [place]         0.35                                 +-------+-----------+-----------+------------+------------+ Right great toe pressure absent, left great toe pressure 39 MEDICAL ISSUES:   Complaint lower extremity wounds: The patient is much more interactive today.  She also states that she is ambulatory.  She  has wounds on her right foot that have not healed..  In addition, she has a fair amount  of edema.  I think the first order of business is to evaluate her blood flow.  I have her scheduled for angiography on 22 November via left femoral approach.  This will need to be with CO2 and contrast as she has renal disease.  I did discuss the risk of worsening renal disease with the use of contrast.  I am also [place] place her in a Unna boot to help with the leg swelling.  All risks and benefits were discussed with the patient who is here with her caregiver.  I also discussed that this is a limb threatening situation if we cannot get her wounds to heal.    Leia Alf, MD, FACS [facility] [phone] Pager [phone]

## 2021-02-12 ENCOUNTER — Other Ambulatory Visit: Payer: Self-pay

## 2021-02-12 ENCOUNTER — Encounter (HOSPITAL_COMMUNITY)
Admission: RE | Disposition: A | Discharge status: Home / Self Care | Payer: Self-pay | Source: Home / Self Care | Attending: Surgery

## 2021-02-12 ENCOUNTER — Ambulatory Visit (HOSPITAL_COMMUNITY)
Admission: RE | Admit: 2021-02-12 | Discharge: 2021-02-12 | Disposition: A | Discharge status: Home / Self Care | Payer: Medicare PPO | Attending: Surgery | Admitting: Surgery

## 2021-02-12 DIAGNOSIS — I70235 Atherosclerosis of native arteries of right leg with ulceration of other part of foot: Secondary | ICD-10-CM | POA: Diagnosis not present

## 2021-02-12 DIAGNOSIS — I129 Hypertensive chronic kidney disease with stage 1 through stage 4 chronic kidney disease, or unspecified chronic kidney disease: Secondary | ICD-10-CM | POA: Insufficient documentation

## 2021-02-12 DIAGNOSIS — E1151 Type 2 diabetes mellitus with diabetic peripheral angiopathy without gangrene: Secondary | ICD-10-CM | POA: Diagnosis not present

## 2021-02-12 DIAGNOSIS — L97519 Non-pressure chronic ulcer of other part of right foot with unspecified severity: Secondary | ICD-10-CM | POA: Diagnosis not present

## 2021-02-12 DIAGNOSIS — E1122 Type 2 diabetes mellitus with diabetic chronic kidney disease: Secondary | ICD-10-CM | POA: Insufficient documentation

## 2021-02-12 DIAGNOSIS — I70245 Atherosclerosis of native arteries of left leg with ulceration of other part of foot: Secondary | ICD-10-CM | POA: Diagnosis not present

## 2021-02-12 DIAGNOSIS — N184 Chronic kidney disease, stage 4 (severe): Secondary | ICD-10-CM | POA: Diagnosis not present

## 2021-02-12 DIAGNOSIS — E11621 Type 2 diabetes mellitus with foot ulcer: Secondary | ICD-10-CM | POA: Insufficient documentation

## 2021-02-12 HISTORY — PX: ABDOMINAL AORTOGRAM W/LOWER EXTREMITY: CATH118223

## 2021-02-12 HISTORY — PX: PERIPHERAL VASCULAR INTERVENTION: CATH118257

## 2021-02-12 LAB — POCT I-STAT, CHEM 8
BUN: 75 mg/dL — ABNORMAL HIGH (ref 8–23)
Calcium, Ion: 1.29 mmol/L (ref 1.15–1.40)
Chloride: 104 mmol/L (ref 98–111)
Creatinine, Ser: 2.2 mg/dL — ABNORMAL HIGH (ref 0.44–1.00)
Glucose, Bld: 114 mg/dL — ABNORMAL HIGH (ref 70–99)
HCT: 32 % — ABNORMAL LOW (ref 36.0–46.0)
Hemoglobin: 10.9 g/dL — ABNORMAL LOW (ref 12.0–15.0)
Potassium: 4.5 mmol/L (ref 3.5–5.1)
Sodium: 139 mmol/L (ref 135–145)
TCO2: 27 mmol/L (ref 22–32)

## 2021-02-12 SURGERY — ABDOMINAL AORTOGRAM W/LOWER EXTREMITY
Anesthesia: LOCAL | Laterality: Right

## 2021-02-12 MED ORDER — SODIUM CHLORIDE 0.9 % IV SOLN: 250.0000 mL | INTRAVENOUS | Status: DC | PRN

## 2021-02-12 MED ORDER — LIDOCAINE HCL (PF) 1 % IJ SOLN
INTRAMUSCULAR | Status: DC | PRN
  Administered 2021-02-12: 18 mL via INTRADERMAL

## 2021-02-12 MED ORDER — HEPARIN (PORCINE) IN NACL 1000-0.9 UT/500ML-% IV SOLN
INTRAVENOUS | Status: DC | PRN
  Administered 2021-02-12 (×2): 500 mL

## 2021-02-12 MED ORDER — METHYLPREDNISOLONE SODIUM SUCC 125 MG IJ SOLR
125.0000 mg | Freq: Once | INTRAMUSCULAR | Status: AC
  Administered 2021-02-12: 125 mg via INTRAVENOUS

## 2021-02-12 MED ORDER — DIPHENHYDRAMINE HCL 50 MG/ML IJ SOLN
25.0000 mg | Freq: Once | INTRAMUSCULAR | Status: AC
  Administered 2021-02-12: 25 mg via INTRAVENOUS

## 2021-02-12 MED ORDER — METHYLPREDNISOLONE SODIUM SUCC 125 MG IJ SOLR
INTRAMUSCULAR | Status: AC
  Filled 2021-02-12: qty 2

## 2021-02-12 MED ORDER — DIPHENHYDRAMINE HCL 50 MG/ML IJ SOLN
INTRAMUSCULAR | Status: AC
  Filled 2021-02-12: qty 1

## 2021-02-12 MED ORDER — LIDOCAINE HCL (PF) 1 % IJ SOLN
INTRAMUSCULAR | Status: AC
  Filled 2021-02-12: qty 30

## 2021-02-12 MED ORDER — ASPIRIN EC 81 MG PO TBEC: 81.0000 mg | DELAYED_RELEASE_TABLET | Freq: Every day | ORAL | Status: DC

## 2021-02-12 MED ORDER — SODIUM CHLORIDE 0.9% FLUSH: 3.0000 mL | INTRAVENOUS | Status: DC | PRN

## 2021-02-12 MED ORDER — SODIUM CHLORIDE 0.9 % WEIGHT BASED INFUSION: 1.0000 mL/kg/h | INTRAVENOUS | Status: DC

## 2021-02-12 MED ORDER — MIDAZOLAM HCL 2 MG/2ML IJ SOLN
INTRAMUSCULAR | Status: AC
  Filled 2021-02-12: qty 2

## 2021-02-12 MED ORDER — SODIUM CHLORIDE 0.9% FLUSH: 3.0000 mL | Freq: Two times a day (BID) | INTRAVENOUS | Status: DC

## 2021-02-12 MED ORDER — CLOPIDOGREL BISULFATE 75 MG PO TABS: 75.0000 mg | ORAL_TABLET | Freq: Every day | ORAL | 11 refills | Status: DC

## 2021-02-12 MED ORDER — MIDAZOLAM HCL 2 MG/2ML IJ SOLN
INTRAMUSCULAR | Status: DC | PRN
  Administered 2021-02-12: .5 mg via INTRAVENOUS

## 2021-02-12 MED ORDER — ACETAMINOPHEN 325 MG PO TABS: 650.0000 mg | ORAL_TABLET | ORAL | Status: DC | PRN

## 2021-02-12 MED ORDER — CLOPIDOGREL BISULFATE 75 MG PO TABS
75.0000 mg | ORAL_TABLET | Freq: Every day | ORAL | Status: DC
  Administered 2021-02-12: 75 mg via ORAL
  Filled 2021-02-12: qty 1

## 2021-02-12 MED ORDER — IODIXANOL 320 MG/ML IV SOLN
INTRAVENOUS | Status: DC | PRN
  Administered 2021-02-12: 25 mL via INTRA_ARTERIAL

## 2021-02-12 MED ORDER — MORPHINE SULFATE (PF) 2 MG/ML IV SOLN: 2.0000 mg | INTRAVENOUS | Status: DC | PRN

## 2021-02-12 MED ORDER — LABETALOL HCL 5 MG/ML IV SOLN: 10.0000 mg | INTRAVENOUS | Status: DC | PRN

## 2021-02-12 MED ORDER — ONDANSETRON HCL 4 MG/2ML IJ SOLN: 4.0000 mg | Freq: Four times a day (QID) | INTRAMUSCULAR | Status: DC | PRN

## 2021-02-12 MED ORDER — FENTANYL CITRATE (PF) 100 MCG/2ML IJ SOLN
INTRAMUSCULAR | Status: DC | PRN
  Administered 2021-02-12 (×2): 25 ug via INTRAVENOUS

## 2021-02-12 MED ORDER — SODIUM CHLORIDE 0.9 % IV SOLN: INTRAVENOUS | Status: DC

## 2021-02-12 MED ORDER — HEPARIN (PORCINE) IN NACL 1000-0.9 UT/500ML-% IV SOLN
INTRAVENOUS | Status: AC
  Filled 2021-02-12: qty 500

## 2021-02-12 MED ORDER — HYDRALAZINE HCL 20 MG/ML IJ SOLN: 5.0000 mg | INTRAMUSCULAR | Status: DC | PRN

## 2021-02-12 MED ORDER — HEPARIN SODIUM (PORCINE) 1000 UNIT/ML IJ SOLN
INTRAMUSCULAR | Status: AC
  Filled 2021-02-12: qty 1

## 2021-02-12 MED ORDER — HEPARIN SODIUM (PORCINE) 1000 UNIT/ML IJ SOLN
INTRAMUSCULAR | Status: DC | PRN
  Administered 2021-02-12: 6000 [IU] via INTRAVENOUS

## 2021-02-12 MED ORDER — FENTANYL CITRATE (PF) 100 MCG/2ML IJ SOLN
INTRAMUSCULAR | Status: AC
  Filled 2021-02-12: qty 2

## 2021-02-12 SURGICAL SUPPLY — 20 items
BALLN MUSTANG 5X100X135 (BALLOONS) ×3
BALLOON MUSTANG 5X100X135 (BALLOONS) IMPLANT
CATH OMNI FLUSH 5F 65CM (CATHETERS) ×1 IMPLANT
DEVICE VASC CLSR CELT ART 6 (Vascular Products) ×1 IMPLANT
KIT ENCORE 26 ADVANTAGE (KITS) ×1 IMPLANT
KIT MICROPUNCTURE NIT STIFF (SHEATH) ×1 IMPLANT
KIT PV (KITS) ×3 IMPLANT
MAT PREVALON FULL STRYKER (MISCELLANEOUS) ×1 IMPLANT
SET FLUSH CO2 (MISCELLANEOUS) ×1 IMPLANT
SHEATH PINNACLE 5F 10CM (SHEATH) ×1 IMPLANT
SHEATH PINNACLE 6F 10CM (SHEATH) ×1 IMPLANT
SHEATH PINNACLE MP 6F 45CM (SHEATH) ×1 IMPLANT
SHEATH PROBE COVER 6X72 (BAG) ×1 IMPLANT
STENT ELUVIA 6X100X130 (Permanent Stent) ×1 IMPLANT
STOPCOCK MORSE 400PSI 3WAY (MISCELLANEOUS) ×1 IMPLANT
SYR MEDRAD MARK 7 150ML (SYRINGE) ×3 IMPLANT
TRANSDUCER W/STOPCOCK (MISCELLANEOUS) ×3 IMPLANT
TRAY PV CATH (CUSTOM PROCEDURE TRAY) ×3 IMPLANT
WIRE BENTSON .035X145CM (WIRE) ×1 IMPLANT
WIRE HI TORQ VERSACORE J 260CM (WIRE) ×1 IMPLANT

## 2021-02-12 NOTE — Interval H&P Note (Signed)
History and Physical Interval Note:  02/12/2021 8:01 AM  Jacqueline Richmond  has presented today for surgery, with the diagnosis of right foot wound.  The various methods of treatment have been discussed with the patient and family. After consideration of risks, benefits and other options for treatment, the patient has consented to  Procedure(s): ABDOMINAL AORTOGRAM W/LOWER EXTREMITY (N/A) as a surgical intervention.  The patient's history has been reviewed, patient examined, no change in status, stable for surgery.  I have reviewed the patient's chart and labs.  Questions were answered to the patient's satisfaction.     [name]

## 2021-02-12 NOTE — Op Note (Signed)
    Patient name: Jacqueline Richmond MRN: [id number] DOB: 03-16-1946 Sex: female  02/12/2021 Pre-operative Diagnosis: Right foot ulcer Post-operative diagnosis:  Same Surgeon:  [name] Procedure Performed:  1.  Ultrasound-guided access, left femoral artery  2.  Abdominal aortogram with CO2  3.  Bilateral lower extremity runoff  4.  Stent, right superficial femoral and popliteal artery  5.  Closure device, Celt  6.  Conscious sedation, 49 minutes   Indications: This is a 75 year old female with right foot and second toe ulcer who comes in today for angiography.  Procedure:  The patient was identified in the holding area and taken to room 8.  The patient was then placed supine on the table and prepped and draped in the usual sterile fashion.  A time out was called.  Conscious sedation was administered with the use of IV fentanyl and Versed under continuous physician and nurse monitoring.  Heart rate, blood pressure, and oxygen saturation were continuously monitored.  Total sedation time was 31minutes.  Ultrasound was used to evaluate the left common femoral artery.  It was patent .  A digital ultrasound image was acquired.  A micropuncture needle was used to access the left common femoral artery under ultrasound guidance.  An 018 wire was advanced without resistance and a micropuncture sheath was placed.  The 018 wire was removed and a benson wire was placed.  The micropuncture sheath was exchanged for a 5 french sheath.  An omniflush catheter was advanced over the wire to the level of L-1.  An abdominal angiogram was obtained.  Next, using the omniflush catheter and a benson wire, the aortic bifurcation was crossed and the catheter was placed into theright external iliac artery and right runoff was obtained.  left runoff was performed via retrograde sheath injections.  Findings:   Aortogram: No significant infrarenal aortic stenosis was identified.  Bilateral common and external iliac arteries are  widely patent.  Right Lower Extremity: The right common femoral profundofemoral artery widely patent.  The superficial femoral artery is patent.  At the adductor canal there is tandem stenosis, greater than 80%.  The below-knee popliteal artery is patent.  The dominant runoff is the anterior tibial artery.  Left Lower Extremity: The left common femoral and profundofemoral artery are widely patent.  The superficial femoral artery does not have any significant stenosis appreciated however there is some narrowing at the adductor canal.  The popliteal artery is widely patent.  There is two-vessel runoff from the anterior tibial and posterior tibial artery  Intervention: After the above images were acquired the decision was made to proceed with intervention.  A 6 French 45 cm sheath was advanced into the right superficial femoral artery.  The patient was fully heparinized.  Next a 035 versa core wire was advanced across the stenosis.  I elected to primarily stent the lesion.  I selected a 6 x 100 Elluvia stent and deployed it across both lesions and postdilated with a 5 mm balloon.  Completion imaging showed resolution of the stenosis with no change in runoff.  The groin was closed with a Celt.  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  The dominant runoff is the anterior tibial artery with moderate disease out onto the foot.   Theotis Burrow, M.D., [facility] of [place] Office: [phone] Pager:  [phone]

## 2021-02-12 NOTE — Progress Notes (Signed)
Report called and given to [facility] at [facility] in [place]. Dawn verbalized understanding. AVS instructions also sent with patient.

## 2021-02-13 ENCOUNTER — Encounter (HOSPITAL_COMMUNITY): Payer: Self-pay | Admitting: Surgery

## 2021-02-26 ENCOUNTER — Ambulatory Visit: Payer: Medicare PPO | Admitting: Pulmonary Disease

## 2021-03-04 ENCOUNTER — Ambulatory Visit: Payer: Medicare PPO | Admitting: Cardiology

## 2021-03-04 ENCOUNTER — Other Ambulatory Visit: Payer: Self-pay

## 2021-03-04 ENCOUNTER — Encounter: Payer: Self-pay | Admitting: Cardiology

## 2021-03-04 VITALS — BP 106/60 | HR 70 | Ht 64.0 in | Wt 134.0 lb

## 2021-03-04 DIAGNOSIS — E119 Type 2 diabetes mellitus without complications: Secondary | ICD-10-CM

## 2021-03-04 DIAGNOSIS — R600 Localized edema: Secondary | ICD-10-CM | POA: Diagnosis not present

## 2021-03-04 DIAGNOSIS — I739 Peripheral vascular disease, unspecified: Secondary | ICD-10-CM | POA: Diagnosis not present

## 2021-03-04 DIAGNOSIS — N1831 Chronic kidney disease, stage 3a: Secondary | ICD-10-CM

## 2021-03-04 DIAGNOSIS — E782 Mixed hyperlipidemia: Secondary | ICD-10-CM

## 2021-03-04 DIAGNOSIS — I10 Essential (primary) hypertension: Secondary | ICD-10-CM

## 2021-03-04 DIAGNOSIS — Z794 Long term (current) use of insulin: Secondary | ICD-10-CM

## 2021-03-04 MED ORDER — ATENOLOL 50 MG PO TABS: 50.0000 mg | ORAL_TABLET | Freq: Two times a day (BID) | ORAL | 3 refills | Status: AC

## 2021-03-04 NOTE — Patient Instructions (Signed)
Medication Instructions:  Your physician has recommended you make the following change in your medication:  DECREASE: Atenolol 50 mg once daily *If you need a refill on your cardiac medications before your next appointment, please call your pharmacy*   Lab Work: None If you have labs (blood work) drawn today and your tests are completely normal, you will receive your results only by: [place] (if you have MyChart) OR A paper copy in the mail If you have any lab test that is abnormal or we need to change your treatment, we will call you to review the results.   Testing/Procedures: None   Follow-Up: At [facility], you and your health needs are our priority.  As part of our continuing mission to provide you with exceptional heart care, we have created designated Provider Care Teams.  These Care Teams include your primary Cardiologist (physician) and Advanced Practice Providers (APPs -  Physician Assistants and Nurse Practitioners) who all work together to provide you with the care you need, when you need it.  We recommend signing up for the patient portal called "MyChart".  Sign up information is provided on this After Visit Summary.  MyChart is used to connect with patients for Virtual Visits (Telemedicine).  Patients are able to view lab/test results, encounter notes, upcoming appointments, etc.  Non-urgent messages can be sent to your provider as well.   To learn more about what you can do with MyChart, go to [email or web address].    Your next appointment:   4 month(s)  The format for your next appointment:   In Person  Provider:   Berniece Salines, DO   Other Instructions

## 2021-03-04 NOTE — Progress Notes (Signed)
Cardiology Office Note:    Date:  03/07/2021   ID:  Jacqueline Richmond, DOB 12-23-1945, MRN [id number]  PCP:  Bernerd Limbo, MD  Cardiologist:  Berniece Salines, DO  Electrophysiologist:  None   Referring MD: Lanier Clam, MD   " I am ok"  History of Present Illness:    Jacqueline Richmond is a 75 y.o. female with a hx of PAD s/p abdominal aortogram, chronic kidney disease stage 3, hypertension, hyperlipidemia and Type II Diabetes mellitus.   Today she presents to establish cardiac care. Today she does not offer any complaints.    Past Medical History:  Diagnosis Date   Allergy    Anemia    Arthritis    Cataract    biltaerally    CKD (chronic kidney disease) stage 3, GFR 30-59 ml/min ([place]) 08/29/2011   per patient, kidneys monitored by Dr Posey Pronto endocrinologist ,lov was  05-18-2018, "kidney fx stable"   Colon polyp    Complication of anesthesia    Difficult intubation    small mouth opening   Diverticulosis    Gout    HTN (hypertension) 08/29/2011   Hyperlipemia    Psoriasis    Type II or unspecified type diabetes mellitus without mention of complication, not stated as uncontrolled 08/29/2011    Past Surgical History:  Procedure Laterality Date   ABDOMINAL AORTOGRAM W/LOWER EXTREMITY N/A 02/12/2021   Procedure: ABDOMINAL AORTOGRAM W/LOWER EXTREMITY;  Surgeon: Serafina Mitchell, MD;  Location: [facility];  Service: Cardiovascular;  Laterality: N/A;   APPLICATION OF WOUND VAC N/A 08/30/2020   Procedure: APPLICATION OF WOUND VAC;  Surgeon: Consuella Lose, MD;  Location: [place];  Service: Neurosurgery;  Laterality: N/A;   BREAST LUMPECTOMY WITH NEEDLE LOCALIZATION Right 05/07/2012   Procedure: BREAST LUMPECTOMY WITH NEEDLE LOCALIZATION;  Surgeon: Merrie Roof, MD;  Location: [place];  Service: General;  Laterality: Right;   CHOLECYSTECTOMY     COLONOSCOPY  2009   [facility]    COLONOSCOPY WITH PROPOFOL N/A 05/20/2018   Procedure: COLONOSCOPY WITH PROPOFOL;  Surgeon: Milus Banister, MD;  Location: [facility];  Service: Endoscopy;  Laterality: N/A;   HERNIA REPAIR     HIP SURGERY     LAMINECTOMY WITH POSTERIOR LATERAL ARTHRODESIS LEVEL 4 N/A 08/01/2020   Procedure: LAMINECTOMY WITH POSTERIOR LATERAL ARTHRODESIS Thoracic nine- Thoracic eleven ;  Surgeon: Consuella Lose, MD;  Location: [facility];  Service: Neurosurgery;  Laterality: N/A;   PERIPHERAL VASCULAR INTERVENTION Right 02/12/2021   Procedure: PERIPHERAL VASCULAR INTERVENTION;  Surgeon: Serafina Mitchell, MD;  Location: [facility];  Service: Cardiovascular;  Laterality: Right;   POLYPECTOMY  05/20/2018   Procedure: POLYPECTOMY;  Surgeon: Milus Banister, MD;  Location: [facility];  Service: Endoscopy;;   RHINOPLASTY     SALPINGOOPHORECTOMY     TOTAL HIP ARTHROPLASTY Right 2004   WOUND EXPLORATION N/A 08/30/2020   Procedure: SUPERFICIALWOUND EXPLORATION, APPLY WOUND VAC;  Surgeon: Consuella Lose, MD;  Location: [place];  Service: Neurosurgery;  Laterality: N/A;    Current Medications: Current Meds  Medication Sig   allopurinol (ZYLOPRIM) 100 MG tablet Take 50 mg by mouth every other day.   aspirin EC 81 MG tablet Take 81 mg by mouth daily. Swallow whole.   atenolol (TENORMIN) 50 MG tablet Take 1 tablet (50 mg total) by mouth 2 (two) times daily.   cloNIDine (CATAPRES) 0.2 MG tablet Take 0.2 mg by mouth 2 (two) times daily.   furosemide (  LASIX) 40 MG tablet Take 40 mg by mouth daily.   gabapentin (NEURONTIN) 100 MG capsule Take 100 mg by mouth at bedtime.   gabapentin (NEURONTIN) 300 MG capsule Take 300 mg by mouth at bedtime.   loratadine (CLARITIN) 10 MG tablet Take 10 mg by mouth daily.   sitaGLIPtin (JANUVIA) 50 MG tablet Take 50 mg by mouth daily.   [DISCONTINUED] atenolol (TENORMIN) 100 MG tablet Take 100 mg by mouth daily.     Allergies:   Codeine, Iodinated diagnostic agents, Pentazocine, and Statins   Social History   Socioeconomic History   Marital status: Married    Spouse  name: Not on file   Number of children: Not on file   Years of education: Not on file   Highest education level: Not on file  Occupational History   Not on file  Tobacco Use   Smoking status: Never   Smokeless tobacco: Never  Vaping Use   Vaping Use: Never used  Substance and Sexual Activity   Alcohol use: Yes    Comment: Drinks on Special Occasions   Drug use: No   Sexual activity: Never  Other Topics Concern   Not on file  Social History Narrative   Not on file   Social Determinants of Health   Financial Resource Strain: Not on file  Food Insecurity: Not on file  Transportation Needs: Not on file  Physical Activity: Not on file  Stress: Not on file  Social Connections: Not on file     Family History: The patient's family history includes Arthritis in her sister; Heart disease in her father. There is no history of Colon cancer, Colon polyps, Esophageal cancer, Stomach cancer, or Rectal cancer.  ROS:   Review of Systems  Constitution: Negative for decreased appetite, fever and weight gain.  HENT: Negative for congestion, ear discharge, hoarse voice and sore throat.   Eyes: Negative for discharge, redness, vision loss in right eye and visual halos.  Cardiovascular: Negative for chest pain, dyspnea on exertion, leg swelling, orthopnea and palpitations.  Respiratory: Negative for cough, hemoptysis, shortness of breath and snoring.   Endocrine: Negative for heat intolerance and polyphagia.  Hematologic/Lymphatic: Negative for bleeding problem. Does not bruise/bleed easily.  Skin: Negative for flushing, nail changes, rash and suspicious lesions.  Musculoskeletal: Negative for arthritis, joint pain, muscle cramps, myalgias, neck pain and stiffness.  Gastrointestinal: Negative for abdominal pain, bowel incontinence, diarrhea and excessive appetite.  Genitourinary: Negative for decreased libido, genital sores and incomplete emptying.  Neurological: Negative for brief paralysis,  focal weakness, headaches and loss of balance.  Psychiatric/Behavioral: Negative for altered mental status, depression and suicidal ideas.  Allergic/Immunologic: Negative for HIV exposure and persistent infections.    EKGs/Labs/Other Studies Reviewed:    The following studies were reviewed today:   EKG:  The ekg ordered today demonstrates    TTE 08/03/2020 IMPRESSIONS   1. Left ventricular ejection fraction, by estimation, is 55 to 60%. The left ventricle has normal function. The left ventricle has no regional wall motion abnormalities. Left ventricular diastolic parameters are consistent with Grade I diastolic  dysfunction (impaired relaxation).   2. Right ventricular systolic function is normal. The right ventricular size is normal. There is normal pulmonary artery systolic pressure. The estimated right ventricular systolic pressure is 40.3 mmHg.   3. The mitral valve is normal in structure. Trivial mitral valve regurgitation. No evidence of mitral stenosis.   4. The aortic valve is tricuspid. Aortic valve regurgitation is not visualized. Mild  aortic valve sclerosis is present, with no evidence of aortic valve stenosis.   5. The inferior vena cava is normal in size with greater than 50% respiratory variability, suggesting right atrial pressure of 3 mmHg.  FINDINGS   Left Ventricle: Left ventricular ejection fraction, by estimation, is 55 to 60%. The left ventricle has normal function. The left ventricle has no regional wall motion abnormalities. The left ventricular internal cavity size was normal in size. There is   no left ventricular hypertrophy. Left ventricular diastolic parameters are consistent with Grade I diastolic dysfunction (impaired relaxation).   Right Ventricle: The right ventricular size is normal. No increase in right ventricular wall thickness. Right ventricular systolic function is normal. There is normal pulmonary artery systolic pressure. The tricuspid regurgitant  velocity is 2.69 m/s, and   with an assumed right atrial pressure of 3 mmHg, the estimated right ventricular systolic pressure is 86.7 mmHg.   Left Atrium: Left atrial size was normal in size.   Right Atrium: Right atrial size was normal in size.   Pericardium: Trivial pericardial effusion is present.   Mitral Valve: The mitral valve is normal in structure. Mild mitral annular calcification. Trivial mitral valve regurgitation. No evidence of mitral valve stenosis.   Tricuspid Valve: The tricuspid valve is normal in structure. Tricuspid valve regurgitation is trivial.   Aortic Valve: The aortic valve is tricuspid. Aortic valve regurgitation is not visualized. Mild aortic valve sclerosis is present, with no evidence of aortic valve stenosis. Aortic valve mean gradient measures 3.0 mmHg. Aortic valve peak gradient  measures 6.2 mmHg. Aortic valve area, by VTI measures 2.57 cm.   Pulmonic Valve: The pulmonic valve was normal in structure. Pulmonic valve regurgitation is trivial.   Aorta: The aortic root is normal in size and structure.   Venous: The inferior vena cava is normal in size with greater than 50% respiratory variability, suggesting right atrial pressure of 3 mmHg.   IAS/Shunts: No atrial level shunt detected by color flow Doppler.       Recent Labs: 12/07/2020: B Natriuretic Peptide 126.4 12/21/2020: TSH 1.576 01/02/2021: ALT 12 01/03/2021: Magnesium 1.9; Platelets 269 02/12/2021: BUN 75; Creatinine, Ser 2.20; Hemoglobin 10.9; Potassium 4.5; Sodium 139  Recent Lipid Panel    Component Value Date/Time   CHOL 116 08/31/2011 1010   TRIG 221 (H) 08/06/2020 0652   HDL 21 (L) 08/31/2011 1010   CHOLHDL 5.5 08/31/2011 1010   VLDL 60 (H) 08/31/2011 1010   LDLCALC 35 08/31/2011 1010    Physical Exam:    VS:  BP 106/60 (BP Location: Left Arm)   Pulse 70   Ht 5\' 4"  (1.626 m)   Wt 134 lb (60.8 kg)   BMI 23.00 kg/m     Wt Readings from Last 3 Encounters:  03/04/21 134 lb  (60.8 kg)  02/12/21 135 lb (61.2 kg)  02/04/21 135 lb (61.2 kg)     GEN: Well nourished, well developed in no acute distress HEENT: Normal NECK: No JVD; No carotid bruits LYMPHATICS: No lymphadenopathy CARDIAC: S1S2 noted,RRR, no murmurs, rubs, gallops RESPIRATORY:  Clear to auscultation without rales, wheezing or rhonchi  ABDOMEN: Soft, non-tender, non-distended, +bowel sounds, no guarding. EXTREMITIES: +1 bilateral leg edema, No cyanosis, no clubbing MUSCULOSKELETAL:  No deformity  SKIN: Warm and dry NEUROLOGIC:  Alert and oriented x 3, non-focal PSYCHIATRIC:  Normal affect, good insight  ASSESSMENT:    1. PAD (peripheral artery disease) ([place])   2. Bilateral leg edema   3. Stage 3a  chronic kidney disease ([place])   4. Insulin-requiring or dependent type II diabetes mellitus ([place])   5. Mixed hyperlipidemia   6. Primary hypertension    PLAN:     Her blood pressure is marginal therefore I will cut back on her atenolol to 50 mg daily.  She has chronic she currently takes Lasix 40 mg daily due to marginal blood pressure and hypoxic increase her Lasix.  What I would really like to do is get she tells me that she has been scheduled with her PCP Thursday for wanted to get that done.    Plan will be continued increase her diuretic dosing once her blood pressure has improved with decreasing her atenolol.  Her blood pressure is marginal, decrease her atenolol to 50 mg daily.   The patient is in agreement with the above plan. The patient left the office in stable condition.  The patient will follow up in   Medication Adjustments/Labs and Tests Ordered: Current medicines are reviewed at length with the patient today.  Concerns regarding medicines are outlined above.  Orders Placed This Encounter  Procedures   EKG 12-Lead   Meds ordered this encounter  Medications   atenolol (TENORMIN) 50 MG tablet    Sig: Take 1 tablet (50 mg total) by mouth 2 (two) times daily.    Dispense:  180  tablet    Refill:  3    Patient Instructions  Medication Instructions:  Your physician has recommended you make the following change in your medication:  DECREASE: Atenolol 50 mg once daily *If you need a refill on your cardiac medications before your next appointment, please call your pharmacy*   Lab Work: None If you have labs (blood work) drawn today and your tests are completely normal, you will receive your results only by: [facility] [place] (if you have MyChart) OR A paper copy in the mail If you have any lab test that is abnormal or we need to change your treatment, we will call you to review the results.   Testing/Procedures: None   Follow-Up: At [facility], you and your health needs are our priority.  As part of our continuing mission to provide you with exceptional heart care, we have created designated Provider Care Teams.  These Care Teams include your primary Cardiologist (physician) and Advanced Practice Providers (APPs -  Physician Assistants and Nurse Practitioners) who all work together to provide you with the care you need, when you need it.  We recommend signing up for the patient portal called "MyChart".  Sign up information is provided on this After Visit Summary.  MyChart is used to connect with patients for Virtual Visits (Telemedicine).  Patients are able to view lab/test results, encounter notes, upcoming appointments, etc.  Non-urgent messages can be sent to your provider as well.   To learn more about what you can do with MyChart, go to [email or web address].    Your next appointment:   4 month(s)  The format for your next appointment:   In Person  Provider:   Berniece Salines, DO   Other Instructions     Adopting a Healthy Lifestyle.  Know what a healthy weight is for you (roughly BMI <25) and aim to maintain this   Aim for 7+ servings of fruits and vegetables daily   65-80+ fluid ounces of water or unsweet tea for healthy kidneys   Limit  to max 1 drink of alcohol per day; avoid smoking/tobacco   Limit animal fats in diet for cholesterol  and heart health - choose grass fed whenever available   Avoid highly processed foods, and foods high in saturated/trans fats   Aim for low stress - take time to unwind and care for your mental health   Aim for 150 min of moderate intensity exercise weekly for heart health, and weights twice weekly for bone health   Aim for 7-9 hours of sleep daily   When it comes to diets, agreement about the perfect plan isnt easy to find, even among the experts. Experts at the [employer] developed an idea known as the Healthy Eating Plate. Just imagine a plate divided into logical, healthy portions.   The emphasis is on diet quality:   Load up on vegetables and fruits - one-half of your plate: Aim for color and variety, and remember that potatoes dont count.   Go for whole grains - one-quarter of your plate: Whole wheat, barley, wheat berries, quinoa, oats, brown rice, and foods made with them. If you want pasta, go with whole wheat pasta.   Protein power - one-quarter of your plate: Fish, chicken, beans, and nuts are all healthy, versatile protein sources. Limit red meat.   The diet, however, does go beyond the plate, offering a few other suggestions.   Use healthy plant oils, such as olive, canola, soy, corn, sunflower and peanut. Check the labels, and avoid partially hydrogenated oil, which have unhealthy trans fats.   If youre thirsty, drink water. Coffee and tea are good in moderation, but skip sugary drinks and limit milk and dairy products to one or two daily servings.   The type of carbohydrate in the diet is more important than the amount. Some sources of carbohydrates, such as vegetables, fruits, whole grains, and beans-are healthier than others.   Finally, stay active  Signed, Berniece Salines, DO  03/07/2021 9:53 PM    [facility]

## 2021-03-07 DIAGNOSIS — E119 Type 2 diabetes mellitus without complications: Secondary | ICD-10-CM | POA: Insufficient documentation

## 2021-03-07 DIAGNOSIS — N1831 Chronic kidney disease, stage 3a: Secondary | ICD-10-CM | POA: Insufficient documentation

## 2021-03-07 DIAGNOSIS — I10 Essential (primary) hypertension: Secondary | ICD-10-CM | POA: Insufficient documentation

## 2021-03-07 DIAGNOSIS — R600 Localized edema: Secondary | ICD-10-CM | POA: Insufficient documentation

## 2021-03-07 DIAGNOSIS — E782 Mixed hyperlipidemia: Secondary | ICD-10-CM | POA: Insufficient documentation

## 2021-03-07 DIAGNOSIS — I739 Peripheral vascular disease, unspecified: Secondary | ICD-10-CM | POA: Insufficient documentation

## 2021-03-13 ENCOUNTER — Other Ambulatory Visit: Payer: Self-pay | Admitting: *Deleted

## 2021-03-13 DIAGNOSIS — I739 Peripheral vascular disease, unspecified: Secondary | ICD-10-CM

## 2021-04-01 ENCOUNTER — Encounter (HOSPITAL_COMMUNITY): Payer: Medicare PPO

## 2021-04-02 ENCOUNTER — Ambulatory Visit: Payer: Medicare PPO | Admitting: Physician Assistant

## 2021-04-02 ENCOUNTER — Encounter: Payer: Self-pay | Admitting: Physician Assistant

## 2021-04-02 ENCOUNTER — Other Ambulatory Visit: Payer: Self-pay

## 2021-04-02 VITALS — BP 112/57 | HR 68 | Temp 97.3°F | Resp 18 | Ht 64.0 in | Wt 135.0 lb

## 2021-04-02 DIAGNOSIS — I739 Peripheral vascular disease, unspecified: Secondary | ICD-10-CM

## 2021-04-02 MED ORDER — CLOPIDOGREL BISULFATE 75 MG PO TABS: 75.0000 mg | ORAL_TABLET | Freq: Every day | ORAL | 11 refills | Status: AC

## 2021-04-02 NOTE — Progress Notes (Addendum)
HISTORY AND PHYSICAL     CC:  follow up. Requesting Provider:  Bernerd Limbo, MD  HPI: This is a 76 y.o. female who is here today for follow up for PAD.  She was originally  seen by Dr. Stanford Breed in the emergency department on 12/20/2020 for a right heel wound.  The patient has been followed by Dr. Earleen Newport with podiatry as well.  At that time, the patient was unable to communicate and answer questions.  MRI showed no evidence of osteomyelitis of the calcaneus.  ABIs were noncompressible.  The right toe pressure was absent and the left toe pressure was 39.  Because the wounds appear to be venous in origin with the exception of the heel ulcer, the patient was placed into an Unna boot's, and heel offloading was performed.  The patient was felt to not be a good candidate for revascularization.   She had seen Dr. Trula Slade back in November and at that time, she was much more interactive and ambulatory.  She did have wounds on the right foot that had not healed and a fair amount of edema.  He placed her in a unna boot and he scheduled her for arteriogram and she underwent  this on 02/12/2021 and had stent of the right SFA and popliteal artery by Dr. Trula Slade.  She had dominant runoff via ATA with moderate disease out onto the foot.  He discussed with her that this is limb threatening if we cannot get her wounds to heal.    She does have history of CKD 3.     The pt returns today for follow up.   She states that she was in an [place] boot until a couple of weeks ago when the wound on the top of her foot was better and the nurse coming to the house didn't think she needed the unna boot any longer.  About a week later, she states she started having increased pain in the right foot.  She states that it hurts to elevate her foot.  The sore on the 2nd toe has essentially healed.   She states that her leg started weeping in the past day or two.  She states that one of the doctors (she thinks in rehab) told her she could stop  taking her Plavix.  She did not have any issues with the Plavix.  She has been off of this for about a month.    The pt is not on a statin for cholesterol management.    (Allergy) The pt is on an aspirin.    Other AC:  Plavix The pt is on BB, clonidine, diuretic for hypertension.  The pt does  have diabetes. Tobacco hx:  never    Past Medical History:  Diagnosis Date   Allergy    Anemia    Arthritis    Cataract    biltaerally    CKD (chronic kidney disease) stage 3, GFR 30-59 ml/min ([place]) 08/29/2011   per patient, kidneys monitored by Dr Posey Pronto endocrinologist ,lov was  05-18-2018, "kidney fx stable"   Colon polyp    Complication of anesthesia    Difficult intubation    small mouth opening   Diverticulosis    Gout    HTN (hypertension) 08/29/2011   Hyperlipemia    Psoriasis    Type II or unspecified type diabetes mellitus without mention of complication, not stated as uncontrolled 08/29/2011    Past Surgical History:  Procedure Laterality Date   ABDOMINAL AORTOGRAM W/LOWER EXTREMITY  N/A 02/12/2021   Procedure: ABDOMINAL AORTOGRAM W/LOWER EXTREMITY;  Surgeon: Serafina Mitchell, MD;  Location: [place] [facility];  Service: Cardiovascular;  Laterality: N/A;   APPLICATION OF WOUND VAC N/A 08/30/2020   Procedure: APPLICATION OF WOUND VAC;  Surgeon: Consuella Lose, MD;  Location: [place];  Service: Neurosurgery;  Laterality: N/A;   BREAST LUMPECTOMY WITH NEEDLE LOCALIZATION Right 05/07/2012   Procedure: BREAST LUMPECTOMY WITH NEEDLE LOCALIZATION;  Surgeon: Merrie Roof, MD;  Location: [place];  Service: General;  Laterality: Right;   CHOLECYSTECTOMY     COLONOSCOPY  2009   [facility]    COLONOSCOPY WITH PROPOFOL N/A 05/20/2018   Procedure: COLONOSCOPY WITH PROPOFOL;  Surgeon: Milus Banister, MD;  [facility]: [facility];  Service: Endoscopy;  Laterality: N/A;   HERNIA REPAIR     HIP SURGERY     LAMINECTOMY WITH POSTERIOR LATERAL ARTHRODESIS LEVEL 4 N/A 08/01/2020   Procedure: LAMINECTOMY  WITH POSTERIOR LATERAL ARTHRODESIS Thoracic nine- Thoracic eleven ;  Surgeon: Consuella Lose, MD;  Location: [place];  Service: Neurosurgery;  Laterality: N/A;   PERIPHERAL VASCULAR INTERVENTION Right 02/12/2021   Procedure: PERIPHERAL VASCULAR INTERVENTION;  Surgeon: Serafina Mitchell, MD;  Location: [place] [facility];  Service: Cardiovascular;  Laterality: Right;   POLYPECTOMY  05/20/2018   Procedure: POLYPECTOMY;  Surgeon: Milus Banister, MD;  [facility];  Service: Endoscopy;;   RHINOPLASTY     SALPINGOOPHORECTOMY     TOTAL HIP ARTHROPLASTY Right 2004   WOUND EXPLORATION N/A 08/30/2020   Procedure: SUPERFICIALWOUND EXPLORATION, APPLY WOUND VAC;  Surgeon: Consuella Lose, MD;  Location: [place];  Service: Neurosurgery;  Laterality: N/A;    Allergies  Allergen Reactions   Codeine Itching   Iodinated Contrast Media     Other reaction(s): Unknown   Pentazocine Other (See Comments)    Hallucinations   Statins     Current Outpatient Medications  Medication Sig Dispense Refill   allopurinol (ZYLOPRIM) 100 MG tablet Take 50 mg by mouth every other day.     aspirin EC 81 MG tablet Take 81 mg by mouth daily. Swallow whole.     atenolol (TENORMIN) 50 MG tablet Take 1 tablet (50 mg total) by mouth 2 (two) times daily. 180 tablet 3   cloNIDine (CATAPRES) 0.2 MG tablet Take 0.2 mg by mouth 2 (two) times daily.     clopidogrel (PLAVIX) 75 MG tablet Take 1 tablet (75 mg total) by mouth daily. (Patient not taking: Reported on 03/04/2021) 30 tablet 11   furosemide (LASIX) 40 MG tablet Take 40 mg by mouth daily.     gabapentin (NEURONTIN) 100 MG capsule Take 100 mg by mouth at bedtime.     gabapentin (NEURONTIN) 300 MG capsule Take 300 mg by mouth at bedtime.     insulin lispro (HUMALOG) 100 UNIT/ML KwikPen Inject 0-14 Units into the skin 2 (two) times daily before a meal. Blood sugar 0-200 = no coverage; 201-250 = 2 units; 251-300 = 4 units; 301-350 = 6 units; 351-400 = 8 units;  401-450 = 10 units; 451-500 = 12 units; >500 = 14 units and call MD. (Patient not taking: Reported on 03/04/2021)     loratadine (CLARITIN) 10 MG tablet Take 10 mg by mouth daily.     sitaGLIPtin (JANUVIA) 50 MG tablet Take 50 mg by mouth daily.     No current facility-administered medications for this visit.    Family History  Problem Relation Age of Onset   Heart disease  Father    Arthritis Sister    Colon cancer Neg Hx    Colon polyps Neg Hx    Esophageal cancer Neg Hx    Stomach cancer Neg Hx    Rectal cancer Neg Hx     Social History   Socioeconomic History   Marital status: Married    Spouse name: Not on file   Number of children: Not on file   Years of education: Not on file   Highest education level: Not on file  Occupational History   Not on file  Tobacco Use   Smoking status: Never   Smokeless tobacco: Never  Vaping Use   Vaping Use: Never used  Substance and Sexual Activity   Alcohol use: Yes    Comment: Drinks on Special Occasions   Drug use: No   Sexual activity: Never  Other Topics Concern   Not on file  Social History Narrative   Not on file   Social Determinants of Health   Financial Resource Strain: Not on file  Food Insecurity: Not on file  Transportation Needs: Not on file  Physical Activity: Not on file  Stress: Not on file  Social Connections: Not on file  Intimate Partner Violence: Not on file     REVIEW OF SYSTEMS:   [X]  denotes positive finding, [ ]  denotes negative finding Cardiac  Comments:  Chest pain or chest pressure:    Shortness of breath upon exertion:    Short of breath when lying flat:    Irregular heart rhythm:        Vascular    Pain in calf, thigh, or hip brought on by ambulation:    Pain in feet at night that wakes you up from your sleep:  x   Blood clot in your veins:    Leg swelling:  x See HPI      Pulmonary    Oxygen at home:    Productive cough:     Wheezing:         Neurologic    Sudden weakness in  arms or legs:     Sudden numbness in arms or legs:     Sudden onset of difficulty speaking or slurred speech:    Temporary loss of vision in one eye:     Problems with dizziness:         Gastrointestinal    Blood in stool:     Vomited blood:         Genitourinary    Burning when urinating:     Blood in urine:        Psychiatric    Major depression:         Hematologic    Bleeding problems:    Problems with blood clotting too easily:        Skin    Rashes or ulcers: x       Constitutional    Fever or chills:      PHYSICAL EXAMINATION:  Today's Vitals   04/02/21 1223  BP: (!) 112/57  Pulse: 68  Resp: 18  Temp: (!) 97.3 F (36.3 C)  TempSrc: Temporal  SpO2: 100%  Weight: 135 lb (61.2 kg)  Height: 5\' 4"  (1.626 m)  PainSc: 8    Body mass index is 23.17 kg/m.   General:  WDWN in NAD; vital signs documented above Gait: Not observed HENT: WNL, normocephalic Pulmonary: normal non-labored breathing , without wheezing Cardiac: regular HR,  Abdomen: soft, NT, no masses Skin:  without rashes Vascular Exam/Pulses: Extremely brisk right AT doppler signal, monophasic right DP/PT and peroneal doppler signals.  Right femoral pulse is palpable Extremities:      Musculoskeletal: no muscle wasting or atrophy  Neurologic: A&O X 3 Psychiatric:  The pt has  flat  affect.   Invasive Vascular Imaging:   Aortogram 02/12/2021: Findings:             Aortogram: No significant infrarenal aortic stenosis was identified.  Bilateral common and external iliac arteries are widely patent.            Right Lower Extremity: The right common femoral profundofemoral artery widely patent.  The superficial femoral artery is patent.  At the adductor canal there is tandem stenosis, greater than 80%.  The below-knee popliteal artery is patent.  The dominant runoff is the anterior tibial artery.             Left Lower Extremity: The left common femoral and profundofemoral artery are widely  patent.  The superficial femoral artery does not have any significant stenosis appreciated however there is some narrowing at the adductor canal.  The popliteal artery is widely patent.  There is two-vessel runoff from the anterior tibial and posterior tibial artery  Impression:             #1  Tandem right superficial femoral and popliteal artery stenoses, greater than 80%, both treated using a 6 x 100 Elluvia stent with no residual stenosis.  The dominant runoff is the anterior tibial artery with moderate disease out onto the foot.   ASSESSMENT/PLAN:: 76 y.o. female here for follow up for PAD and s/p aortogram with right SFA and popliteal artery stenting on 02/12/2021 by Dr. Trula Slade for non healing wounds.  She has hx of CKD 3.   -pt has very brisk biphasic AT doppler signal on the right as well as monophasic right DP/PT/pero.  The sore on the right 2nd toe is essentially healed from picture seen on 02/04/2021.  -On aortgram in November, she had dominant runoff via ATA.  Discussed pt with Dr. Carlis Abbott and given pt was in a unna boot previously and she has brisk ATA doppler signal today, will put her in unna boot and bring her back next week for unna boot change and make sure she is tolerating this.  She has not been taking her Plavix, so we will send this to the pharmacy.  Discussed with pt that this will help with the longevity of the stent. She did not have any issues from it before.  -otherwise, she will keep her scheduled appt on 04/15/2021 for RLE arterial duplex and ABI.  Discussed with her that if she has any issues before then, she should call us to be seen.  She expressed understanding.    Leontine Locket, [facility] [phone]  Clinic MD:   Carlis Abbott

## 2021-04-08 ENCOUNTER — Other Ambulatory Visit: Payer: Self-pay

## 2021-04-08 ENCOUNTER — Ambulatory Visit: Payer: Medicare PPO | Admitting: Physician Assistant

## 2021-04-08 DIAGNOSIS — I878 Other specified disorders of veins: Secondary | ICD-10-CM

## 2021-04-08 NOTE — Progress Notes (Signed)
Pt feels her leg has improved with unna boot.       Continue weekly unna boot changes and f/u with MD in 3-4 weeks.   Leontine Locket, [facility] 04/08/2021 3:35 PM

## 2021-04-15 ENCOUNTER — Ambulatory Visit (HOSPITAL_COMMUNITY)
Admission: RE | Admit: 2021-04-15 | Discharge: 2021-04-15 | Disposition: A | Discharge status: Home / Self Care | Payer: Medicare PPO | Source: Ambulatory Visit | Attending: Surgery | Admitting: Surgery

## 2021-04-15 ENCOUNTER — Other Ambulatory Visit: Payer: Self-pay

## 2021-04-15 ENCOUNTER — Ambulatory Visit (INDEPENDENT_AMBULATORY_CARE_PROVIDER_SITE_OTHER): Payer: Medicare PPO | Admitting: Physician Assistant

## 2021-04-15 ENCOUNTER — Ambulatory Visit (INDEPENDENT_AMBULATORY_CARE_PROVIDER_SITE_OTHER)
Admission: RE | Admit: 2021-04-15 | Discharge: 2021-04-15 | Disposition: A | Discharge status: Home / Self Care | Payer: Medicare PPO | Source: Ambulatory Visit | Attending: Surgery | Admitting: Surgery

## 2021-04-15 VITALS — BP 120/65 | HR 62 | Temp 97.4°F | Resp 18 | Ht 64.0 in

## 2021-04-15 DIAGNOSIS — I739 Peripheral vascular disease, unspecified: Secondary | ICD-10-CM

## 2021-04-15 DIAGNOSIS — I872 Venous insufficiency (chronic) (peripheral): Secondary | ICD-10-CM | POA: Diagnosis not present

## 2021-04-15 DIAGNOSIS — L97219 Non-pressure chronic ulcer of right calf with unspecified severity: Secondary | ICD-10-CM | POA: Diagnosis not present

## 2021-04-15 NOTE — Progress Notes (Signed)
Office Note     CC:  follow up Requesting Provider:  Bernerd Limbo, MD  HPI: Jacqueline Richmond is a 76 y.o. (1945-06-04) female who presents status post right SFA/popliteal artery stenting by Dr. Trula Slade due to right heel ulceration on 02/12/2021.  She was initially seen for venous insufficiency with venous ulcers however was found to have arterial insufficiency as well.  She lives at home with her husband however utilizes a home health aide for activities of daily living.  She believes ulcers of right lower extremity are nearly healed.  She has edema of left leg as well however does not wear any compression.  She denies any open wounds of left lower extremity.  She is on aspirin and Plavix daily.  She denies tobacco use.   Past Medical History:  Diagnosis Date   Allergy    Anemia    Arthritis    Cataract    biltaerally    CKD (chronic kidney disease) stage 3, GFR 30-59 ml/min ([place]) 08/29/2011   per patient, kidneys monitored by Dr Posey Pronto endocrinologist ,lov was  05-18-2018, "kidney fx stable"   Colon polyp    Complication of anesthesia    Difficult intubation    small mouth opening   Diverticulosis    Gout    HTN (hypertension) 08/29/2011   Hyperlipemia    Psoriasis    Type II or unspecified type diabetes mellitus without mention of complication, not stated as uncontrolled 08/29/2011    Past Surgical History:  Procedure Laterality Date   ABDOMINAL AORTOGRAM W/LOWER EXTREMITY N/A 02/12/2021   Procedure: ABDOMINAL AORTOGRAM W/LOWER EXTREMITY;  Surgeon: Serafina Mitchell, MD;  Location: [place] [facility];  Service: Cardiovascular;  Laterality: N/A;   APPLICATION OF WOUND VAC N/A 08/30/2020   Procedure: APPLICATION OF WOUND VAC;  Surgeon: Consuella Lose, MD;  Location: [place];  Service: Neurosurgery;  Laterality: N/A;   BREAST LUMPECTOMY WITH NEEDLE LOCALIZATION Right 05/07/2012   Procedure: BREAST LUMPECTOMY WITH NEEDLE LOCALIZATION;  Surgeon: Merrie Roof, MD;  Location: [place];  Service:  General;  Laterality: Right;   CHOLECYSTECTOMY     COLONOSCOPY  2009   [facility]    COLONOSCOPY WITH PROPOFOL N/A 05/20/2018   Procedure: COLONOSCOPY WITH PROPOFOL;  Surgeon: Milus Banister, MD;  [facility];  Service: Endoscopy;  Laterality: N/A;   HERNIA REPAIR     HIP SURGERY     LAMINECTOMY WITH POSTERIOR LATERAL ARTHRODESIS LEVEL 4 N/A 08/01/2020   Procedure: LAMINECTOMY WITH POSTERIOR LATERAL ARTHRODESIS Thoracic nine- Thoracic eleven ;  Surgeon: Consuella Lose, MD;  Location: [place];  Service: Neurosurgery;  Laterality: N/A;   PERIPHERAL VASCULAR INTERVENTION Right 02/12/2021   Procedure: PERIPHERAL VASCULAR INTERVENTION;  Surgeon: Serafina Mitchell, MD;  Location: [place] [facility];  Service: Cardiovascular;  Laterality: Right;   POLYPECTOMY  05/20/2018   Procedure: POLYPECTOMY;  Surgeon: Milus Banister, MD;  [facility];  Service: Endoscopy;;   RHINOPLASTY     SALPINGOOPHORECTOMY     TOTAL HIP ARTHROPLASTY Right 2004   WOUND EXPLORATION N/A 08/30/2020   Procedure: SUPERFICIALWOUND EXPLORATION, APPLY WOUND VAC;  Surgeon: Consuella Lose, MD;  Location: [place];  Service: Neurosurgery;  Laterality: N/A;    Social History   Socioeconomic History   Marital status: Married    Spouse name: Not on file   Number of children: Not on file   Years of education: Not on file   Highest education level: Not on file  Occupational  History   Not on file  Tobacco Use   Smoking status: Never   Smokeless tobacco: Never  Vaping Use   Vaping Use: Never used  Substance and Sexual Activity   Alcohol use: Yes    Comment: Drinks on Special Occasions   Drug use: No   Sexual activity: Never  Other Topics Concern   Not on file  Social History Narrative   Not on file   Social Determinants of Health   Financial Resource Strain: Not on file  Food Insecurity: Not on file  Transportation Needs: Not on file  Physical Activity: Not on file  Stress: Not on file  Social  Connections: Not on file  Intimate Partner Violence: Not on file    Family History  Problem Relation Age of Onset   Heart disease Father    Arthritis Sister    Colon cancer Neg Hx    Colon polyps Neg Hx    Esophageal cancer Neg Hx    Stomach cancer Neg Hx    Rectal cancer Neg Hx     Current Outpatient Medications  Medication Sig Dispense Refill   allopurinol (ZYLOPRIM) 100 MG tablet Take 50 mg by mouth every other day.     aspirin EC 81 MG tablet Take 81 mg by mouth daily. Swallow whole.     atenolol (TENORMIN) 50 MG tablet Take 1 tablet (50 mg total) by mouth 2 (two) times daily. 180 tablet 3   cloNIDine (CATAPRES) 0.2 MG tablet Take 0.2 mg by mouth 2 (two) times daily.     clopidogrel (PLAVIX) 75 MG tablet Take 1 tablet (75 mg total) by mouth daily. 30 tablet 11   furosemide (LASIX) 40 MG tablet Take 40 mg by mouth daily.     gabapentin (NEURONTIN) 100 MG capsule Take 100 mg by mouth at bedtime.     gabapentin (NEURONTIN) 300 MG capsule Take 300 mg by mouth at bedtime.     insulin lispro (HUMALOG) 100 UNIT/ML KwikPen Inject 0-14 Units into the skin 2 (two) times daily before a meal. Blood sugar 0-200 = no coverage; 201-250 = 2 units; 251-300 = 4 units; 301-350 = 6 units; 351-400 = 8 units; 401-450 = 10 units; 451-500 = 12 units; >500 = 14 units and call MD. (Patient not taking: Reported on 03/04/2021)     loratadine (CLARITIN) 10 MG tablet Take 10 mg by mouth daily.     sitaGLIPtin (JANUVIA) 50 MG tablet Take 50 mg by mouth daily.     No current facility-administered medications for this visit.    Allergies  Allergen Reactions   Codeine Itching   Iodinated Contrast Media     Other reaction(s): Unknown   Pentazocine Other (See Comments)    Hallucinations   Statins      REVIEW OF SYSTEMS:   [X]  denotes positive finding, [ ]  denotes negative finding Cardiac  Comments:  Chest pain or chest pressure:    Shortness of breath upon exertion:    Short of breath when lying  flat:    Irregular heart rhythm:        Vascular    Pain in calf, thigh, or hip brought on by ambulation:    Pain in feet at night that wakes you up from your sleep:     Blood clot in your veins:    Leg swelling:         Pulmonary    Oxygen at home:    Productive cough:  Wheezing:         Neurologic    Sudden weakness in arms or legs:     Sudden numbness in arms or legs:     Sudden onset of difficulty speaking or slurred speech:    Temporary loss of vision in one eye:     Problems with dizziness:         Gastrointestinal    Blood in stool:     Vomited blood:         Genitourinary    Burning when urinating:     Blood in urine:        Psychiatric    Major depression:         Hematologic    Bleeding problems:    Problems with blood clotting too easily:        Skin    Rashes or ulcers:        Constitutional    Fever or chills:      PHYSICAL EXAMINATION:  Vitals:   04/15/21 1338  BP: 120/65  Pulse: 62  Resp: 18  Temp: (!) 97.4 F (36.3 C)  TempSrc: Temporal  SpO2: 99%  Height: 5\' 4"  (1.626 m)    General:  WDWN in NAD; vital signs documented above Gait: Not observed HENT: WNL, normocephalic Pulmonary: normal non-labored breathing  Cardiac: regular HR Abdomen: soft, NT, no masses Skin: without rashes Vascular Exam/Pulses:  Right Left  Radial 2+ (normal) 2+ (normal)   Extremities: without ischemic changes, without Gangrene , without cellulitis; without open wounds;  Musculoskeletal: no muscle wasting or atrophy  Neurologic: A&O X 3;  No focal weakness or paresthesias are detected Psychiatric:  The pt has Normal affect.      Non-Invasive Vascular Imaging:   R SFA/popliteal stent patent by arterial duplex    ASSESSMENT/PLAN:: 76 y.o. female s/p R SFA/popliteal stent due to heel ulcer   - R heel ulceration healed since SFA/popliteal stent placement - venous wounds nearly healed - Continue RLE unna boot 1 more week; patient will also be  measured for and prescribed compression for LLE (15-31mmhg knee high) I have encouraged her to wear this daily; she will also need knee high compression of RLE once unna boot is discontinued - Continue aspirin and plavix daily - Recheck RLE arterial duplex and ABIs in 6 months   Dagoberto Ligas, PA-C [facility] [phone]  Clinic MD:   Trula Slade

## 2021-04-17 ENCOUNTER — Other Ambulatory Visit: Payer: Self-pay | Admitting: *Deleted

## 2021-04-17 DIAGNOSIS — I7025 Atherosclerosis of native arteries of other extremities with ulceration: Secondary | ICD-10-CM

## 2021-04-17 DIAGNOSIS — I739 Peripheral vascular disease, unspecified: Secondary | ICD-10-CM

## 2021-04-24 ENCOUNTER — Ambulatory Visit: Payer: Medicare PPO

## 2021-04-24 ENCOUNTER — Other Ambulatory Visit: Payer: Self-pay

## 2021-04-24 DIAGNOSIS — S81801D Unspecified open wound, right lower leg, subsequent encounter: Secondary | ICD-10-CM | POA: Diagnosis not present

## 2021-04-24 NOTE — Progress Notes (Signed)
Patient present in office today for unilateral RLE Unna boot change. Today the wounds located on the shin & top of her foot looks superficial & have improved. She reports her leg pains have decreased and has noticed less drainage. Today, the unna boot was replaced. Patient tolerated Unna boot wrapping well & advised she will need to return to our office for the next 3 weeks for Unna boot changes. Patient voiced her understanding.         Supervising MD: Scot Dock, MD/Cain,MD/Matt Gaylan Gerold Geraldine Solar A.,CMA

## 2021-05-02 ENCOUNTER — Other Ambulatory Visit: Payer: Self-pay

## 2021-05-02 ENCOUNTER — Ambulatory Visit: Payer: Medicare PPO

## 2021-05-02 DIAGNOSIS — S81801D Unspecified open wound, right lower leg, subsequent encounter: Secondary | ICD-10-CM

## 2021-05-02 NOTE — Progress Notes (Signed)
Patient present in office today for unilateral unna boot change of the RLE. Today the wounds located on the RLE have completely healed but patient requested to have 1 additional week of the unna boot wrap to be certain every thing has healed completely. She denies pain and excessive drainage. Today, the unna boot was replaced. Patient tolerated Unna boot wrapping well & advised she will need to return to our office next week for wound check. Patient voiced her understanding.         Supervising MD: Dickson,MD/Samantha Rhyne,PA-C Geraldine Solar A.,CMA

## 2021-05-09 ENCOUNTER — Other Ambulatory Visit: Payer: Self-pay

## 2021-05-09 ENCOUNTER — Ambulatory Visit: Payer: Medicare PPO

## 2021-05-09 DIAGNOSIS — R600 Localized edema: Secondary | ICD-10-CM

## 2021-05-10 NOTE — Progress Notes (Signed)
Patient present in office today for unilateral unna boot removal and wound check of the RLE. Today the wounds located on the RLE have completely healed. She denies pain and excessive drainage. Today, the unna boot was removed. Patient and care taker were both advised to do a very light compression with Ace wraps along with elevation of her legs considering patient is unable to tolerate compression hose and has a dx of PAD to prevent any new wounds from developing and to help with excessive swelling. Patient voiced her understanding.   Clinic MD: Br[place],MD/Samantha Rhyne,PA-C/Matthew Eveland,PA-C  Trevonne Nyland A.,CMA

## 2021-05-21 ENCOUNTER — Telehealth: Payer: Self-pay | Admitting: *Deleted

## 2021-05-21 NOTE — Telephone Encounter (Signed)
Patient called and states she is having some swelling and weeping of her RLE. Patient is not wrapping her legs as instructed at her last visit. Instructed patient to wrap her legs and elevate above her heart. Instructed patient to call back to schedule an appt. if she develops any open areas or fever. Patient verbalized understanding of all instuctions.

## 2021-06-03 ENCOUNTER — Inpatient Hospital Stay (HOSPITAL_COMMUNITY)
Admission: EM | Admit: 2021-06-03 | Discharge: 2021-06-07 | DRG: 641 | Disposition: A | Discharge status: Skilled Nursing Facility | Payer: Medicare PPO | Attending: Internal Medicine | Admitting: Internal Medicine

## 2021-06-03 ENCOUNTER — Emergency Department (HOSPITAL_COMMUNITY): Payer: Medicare PPO

## 2021-06-03 DIAGNOSIS — Z885 Allergy status to narcotic agent status: Secondary | ICD-10-CM

## 2021-06-03 DIAGNOSIS — I129 Hypertensive chronic kidney disease with stage 1 through stage 4 chronic kidney disease, or unspecified chronic kidney disease: Secondary | ICD-10-CM | POA: Diagnosis present

## 2021-06-03 DIAGNOSIS — E860 Dehydration: Secondary | ICD-10-CM | POA: Diagnosis present

## 2021-06-03 DIAGNOSIS — L89616 Pressure-induced deep tissue damage of right heel: Secondary | ICD-10-CM | POA: Diagnosis present

## 2021-06-03 DIAGNOSIS — R5381 Other malaise: Secondary | ICD-10-CM | POA: Diagnosis present

## 2021-06-03 DIAGNOSIS — Z794 Long term (current) use of insulin: Secondary | ICD-10-CM

## 2021-06-03 DIAGNOSIS — R296 Repeated falls: Secondary | ICD-10-CM | POA: Diagnosis present

## 2021-06-03 DIAGNOSIS — L89896 Pressure-induced deep tissue damage of other site: Secondary | ICD-10-CM | POA: Diagnosis present

## 2021-06-03 DIAGNOSIS — E875 Hyperkalemia: Principal | ICD-10-CM | POA: Diagnosis present

## 2021-06-03 DIAGNOSIS — E119 Type 2 diabetes mellitus without complications: Secondary | ICD-10-CM

## 2021-06-03 DIAGNOSIS — Z23 Encounter for immunization: Secondary | ICD-10-CM

## 2021-06-03 DIAGNOSIS — J309 Allergic rhinitis, unspecified: Secondary | ICD-10-CM | POA: Diagnosis present

## 2021-06-03 DIAGNOSIS — E1151 Type 2 diabetes mellitus with diabetic peripheral angiopathy without gangrene: Secondary | ICD-10-CM | POA: Diagnosis present

## 2021-06-03 DIAGNOSIS — N184 Chronic kidney disease, stage 4 (severe): Secondary | ICD-10-CM | POA: Diagnosis present

## 2021-06-03 DIAGNOSIS — R532 Functional quadriplegia: Secondary | ICD-10-CM | POA: Diagnosis present

## 2021-06-03 DIAGNOSIS — Z7902 Long term (current) use of antithrombotics/antiplatelets: Secondary | ICD-10-CM

## 2021-06-03 DIAGNOSIS — Z96641 Presence of right artificial hip joint: Secondary | ICD-10-CM | POA: Diagnosis present

## 2021-06-03 DIAGNOSIS — Z20822 Contact with and (suspected) exposure to covid-19: Secondary | ICD-10-CM | POA: Diagnosis present

## 2021-06-03 DIAGNOSIS — Z888 Allergy status to other drugs, medicaments and biological substances status: Secondary | ICD-10-CM

## 2021-06-03 DIAGNOSIS — Z79899 Other long term (current) drug therapy: Secondary | ICD-10-CM

## 2021-06-03 DIAGNOSIS — Z9049 Acquired absence of other specified parts of digestive tract: Secondary | ICD-10-CM

## 2021-06-03 DIAGNOSIS — R911 Solitary pulmonary nodule: Secondary | ICD-10-CM | POA: Diagnosis present

## 2021-06-03 DIAGNOSIS — E8809 Other disorders of plasma-protein metabolism, not elsewhere classified: Secondary | ICD-10-CM | POA: Diagnosis present

## 2021-06-03 DIAGNOSIS — Z91041 Radiographic dye allergy status: Secondary | ICD-10-CM

## 2021-06-03 DIAGNOSIS — N179 Acute kidney failure, unspecified: Secondary | ICD-10-CM | POA: Diagnosis present

## 2021-06-03 DIAGNOSIS — S0101XA Laceration without foreign body of scalp, initial encounter: Secondary | ICD-10-CM | POA: Diagnosis present

## 2021-06-03 DIAGNOSIS — I459 Conduction disorder, unspecified: Secondary | ICD-10-CM | POA: Diagnosis present

## 2021-06-03 DIAGNOSIS — W010XXA Fall on same level from slipping, tripping and stumbling without subsequent striking against object, initial encounter: Secondary | ICD-10-CM | POA: Diagnosis present

## 2021-06-03 DIAGNOSIS — W19XXXA Unspecified fall, initial encounter: Secondary | ICD-10-CM

## 2021-06-03 DIAGNOSIS — Z8261 Family history of arthritis: Secondary | ICD-10-CM

## 2021-06-03 DIAGNOSIS — Z86010 Personal history of colonic polyps: Secondary | ICD-10-CM

## 2021-06-03 DIAGNOSIS — M899 Disorder of bone, unspecified: Secondary | ICD-10-CM | POA: Diagnosis present

## 2021-06-03 DIAGNOSIS — Z7984 Long term (current) use of oral hypoglycemic drugs: Secondary | ICD-10-CM

## 2021-06-03 DIAGNOSIS — Z8249 Family history of ischemic heart disease and other diseases of the circulatory system: Secondary | ICD-10-CM

## 2021-06-03 DIAGNOSIS — E1122 Type 2 diabetes mellitus with diabetic chronic kidney disease: Secondary | ICD-10-CM | POA: Diagnosis present

## 2021-06-03 DIAGNOSIS — E872 Acidosis, unspecified: Secondary | ICD-10-CM | POA: Diagnosis present

## 2021-06-03 DIAGNOSIS — Z7982 Long term (current) use of aspirin: Secondary | ICD-10-CM

## 2021-06-03 DIAGNOSIS — R197 Diarrhea, unspecified: Secondary | ICD-10-CM | POA: Diagnosis present

## 2021-06-03 DIAGNOSIS — I10 Essential (primary) hypertension: Secondary | ICD-10-CM | POA: Diagnosis present

## 2021-06-03 DIAGNOSIS — J90 Pleural effusion, not elsewhere classified: Secondary | ICD-10-CM | POA: Diagnosis present

## 2021-06-03 LAB — CBC WITH DIFFERENTIAL/PLATELET
Abs Immature Granulocytes: 0.04 10*3/uL (ref 0.00–0.07)
Basophils Absolute: 0 10*3/uL (ref 0.0–0.1)
Basophils Relative: 1 %
Eosinophils Absolute: 0.1 10*3/uL (ref 0.0–0.5)
Eosinophils Relative: 1 %
HCT: 43.4 % (ref 36.0–46.0)
Hemoglobin: 13.2 g/dL (ref 12.0–15.0)
Immature Granulocytes: 1 %
Lymphocytes Relative: 12 %
Lymphs Abs: 1 10*3/uL (ref 0.7–4.0)
MCH: 25.2 pg — ABNORMAL LOW (ref 26.0–34.0)
MCHC: 30.4 g/dL (ref 30.0–36.0)
MCV: 82.8 fL (ref 80.0–100.0)
Monocytes Absolute: 0.5 10*3/uL (ref 0.1–1.0)
Monocytes Relative: 6 %
Neutro Abs: 6.8 10*3/uL (ref 1.7–7.7)
Neutrophils Relative %: 79 %
Platelets: 183 10*3/uL (ref 150–400)
RBC: 5.24 MIL/uL — ABNORMAL HIGH (ref 3.87–5.11)
RDW: 17.6 % — ABNORMAL HIGH (ref 11.5–15.5)
WBC: 8.4 10*3/uL (ref 4.0–10.5)
nRBC: 0 % (ref 0.0–0.2)

## 2021-06-03 LAB — COMPREHENSIVE METABOLIC PANEL
ALT: 15 U/L (ref 0–44)
AST: 17 U/L (ref 15–41)
Albumin: 3.5 g/dL (ref 3.5–5.0)
Alkaline Phosphatase: 179 U/L — ABNORMAL HIGH (ref 38–126)
Anion gap: 9 (ref 5–15)
BUN: 63 mg/dL — ABNORMAL HIGH (ref 8–23)
CO2: 21 mmol/L — ABNORMAL LOW (ref 22–32)
Calcium: 10.1 mg/dL (ref 8.9–10.3)
Chloride: 106 mmol/L (ref 98–111)
Creatinine, Ser: 3.63 mg/dL — ABNORMAL HIGH (ref 0.44–1.00)
GFR, Estimated: 13 mL/min — ABNORMAL LOW (ref >60)
Glucose, Bld: 113 mg/dL — ABNORMAL HIGH (ref 70–99)
Potassium: >7.5 mmol/L (ref 3.5–5.1)
Sodium: 136 mmol/L (ref 135–145)
Total Bilirubin: 0.3 mg/dL (ref 0.3–1.2)
Total Protein: 9 g/dL — ABNORMAL HIGH (ref 6.5–8.1)

## 2021-06-03 LAB — I-STAT CHEM 8, ED
BUN: 64 mg/dL — ABNORMAL HIGH (ref 8–23)
Calcium, Ion: 1.2 mmol/L (ref 1.15–1.40)
Chloride: 111 mmol/L (ref 98–111)
Creatinine, Ser: 4 mg/dL — ABNORMAL HIGH (ref 0.44–1.00)
Glucose, Bld: 107 mg/dL — ABNORMAL HIGH (ref 70–99)
HCT: 44 % (ref 36.0–46.0)
Hemoglobin: 15 g/dL (ref 12.0–15.0)
Potassium: 7.6 mmol/L (ref 3.5–5.1)
Sodium: 139 mmol/L (ref 135–145)
TCO2: 24 mmol/L (ref 22–32)

## 2021-06-03 IMAGING — DX DG PORTABLE PELVIS
1 series · 2 of 2 positions shown · non-contrast
Comparison: [DATE]

CLINICAL DATA: Fell

EXAM:
PORTABLE PELVIS 1-2 VIEWS

[Series 1: pelvis · 0.14mm/px · 2 of 2 slices shown]
[im 1/2]
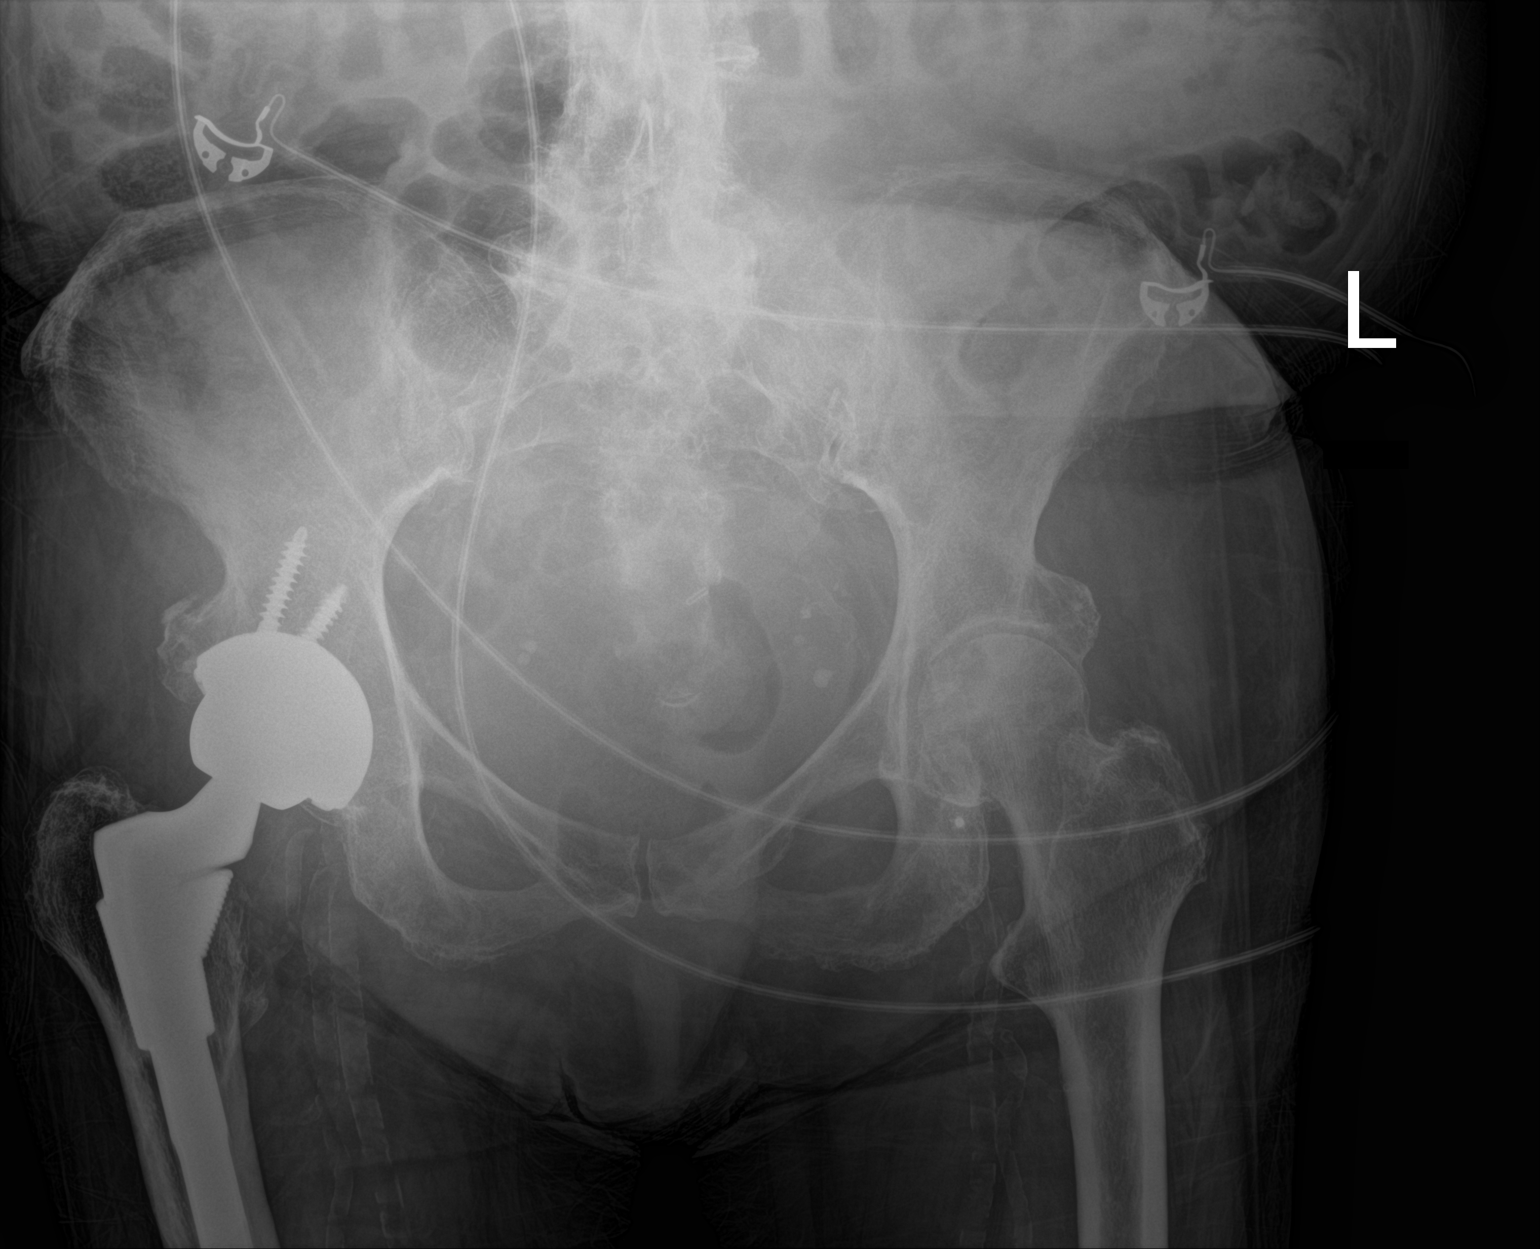
[im 2/2]
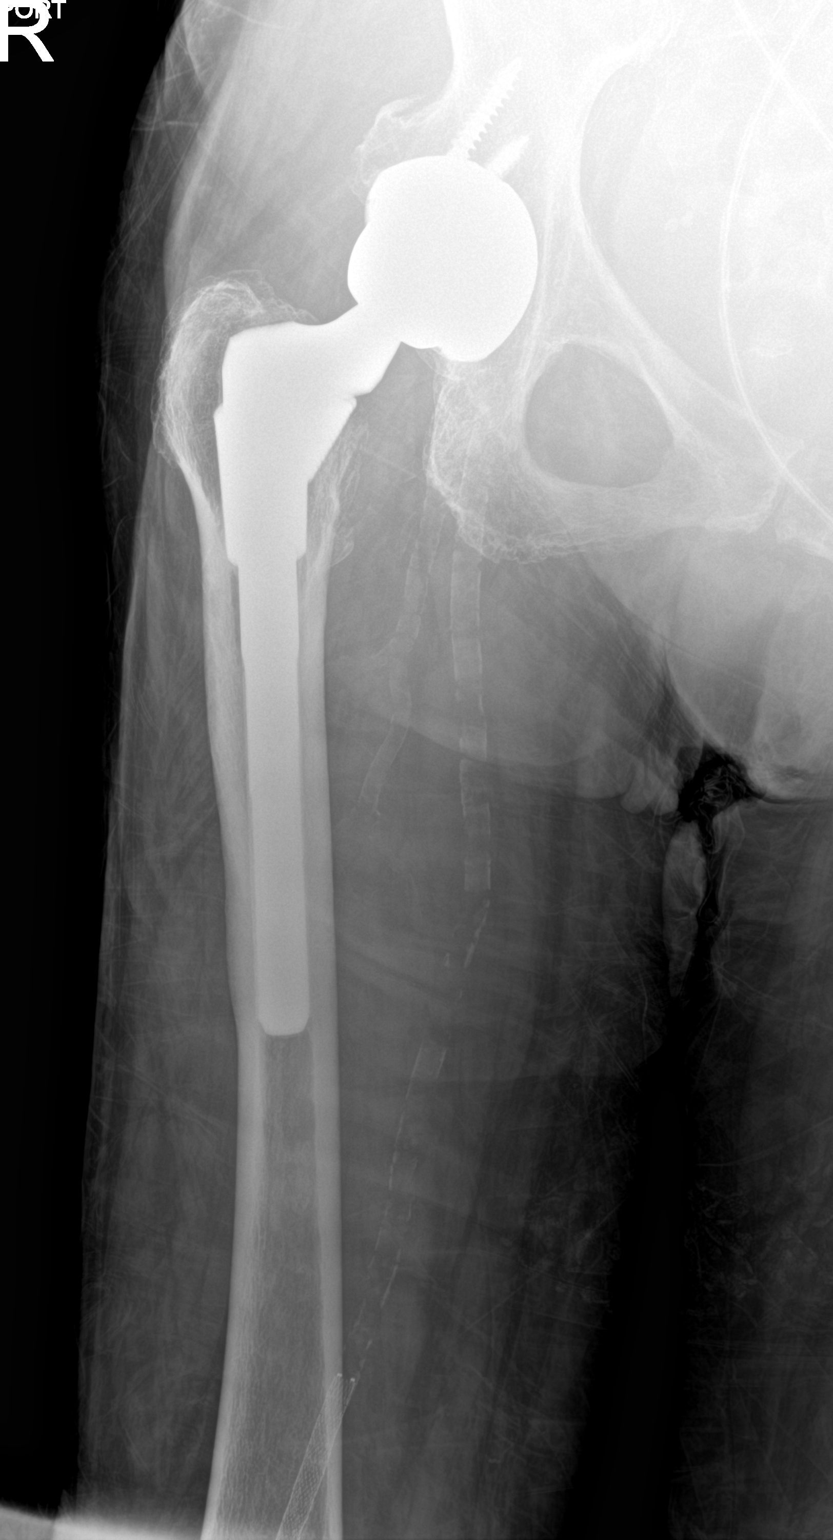

[2 of 2 positions shown; findings below may reference images not displayed]

FINDINGS: Frontal view of the pelvis as well as a frontal view of the proximal
right femur was performed. Right hip arthroplasty is in the expected
position without signs of acute complication. There are no acute
displaced pelvic fractures. Stable left hip osteoarthritis.
Extensive lower lumbar spondylosis and facet hypertrophy. Remainder
of the bony pelvis is unremarkable. There is diffuse
atherosclerosis.
IMPRESSION: 1. No acute displaced fracture.
2. Stable left hip osteoarthritis.
3. Unremarkable right hip arthroplasty.

## 2021-06-03 IMAGING — DX DG CHEST 1V PORT
1 series · 1 of 1 positions shown · non-contrast
Comparison: [DATE]

CLINICAL DATA: Fell

EXAM:
PORTABLE CHEST 1 VIEW

[chest]
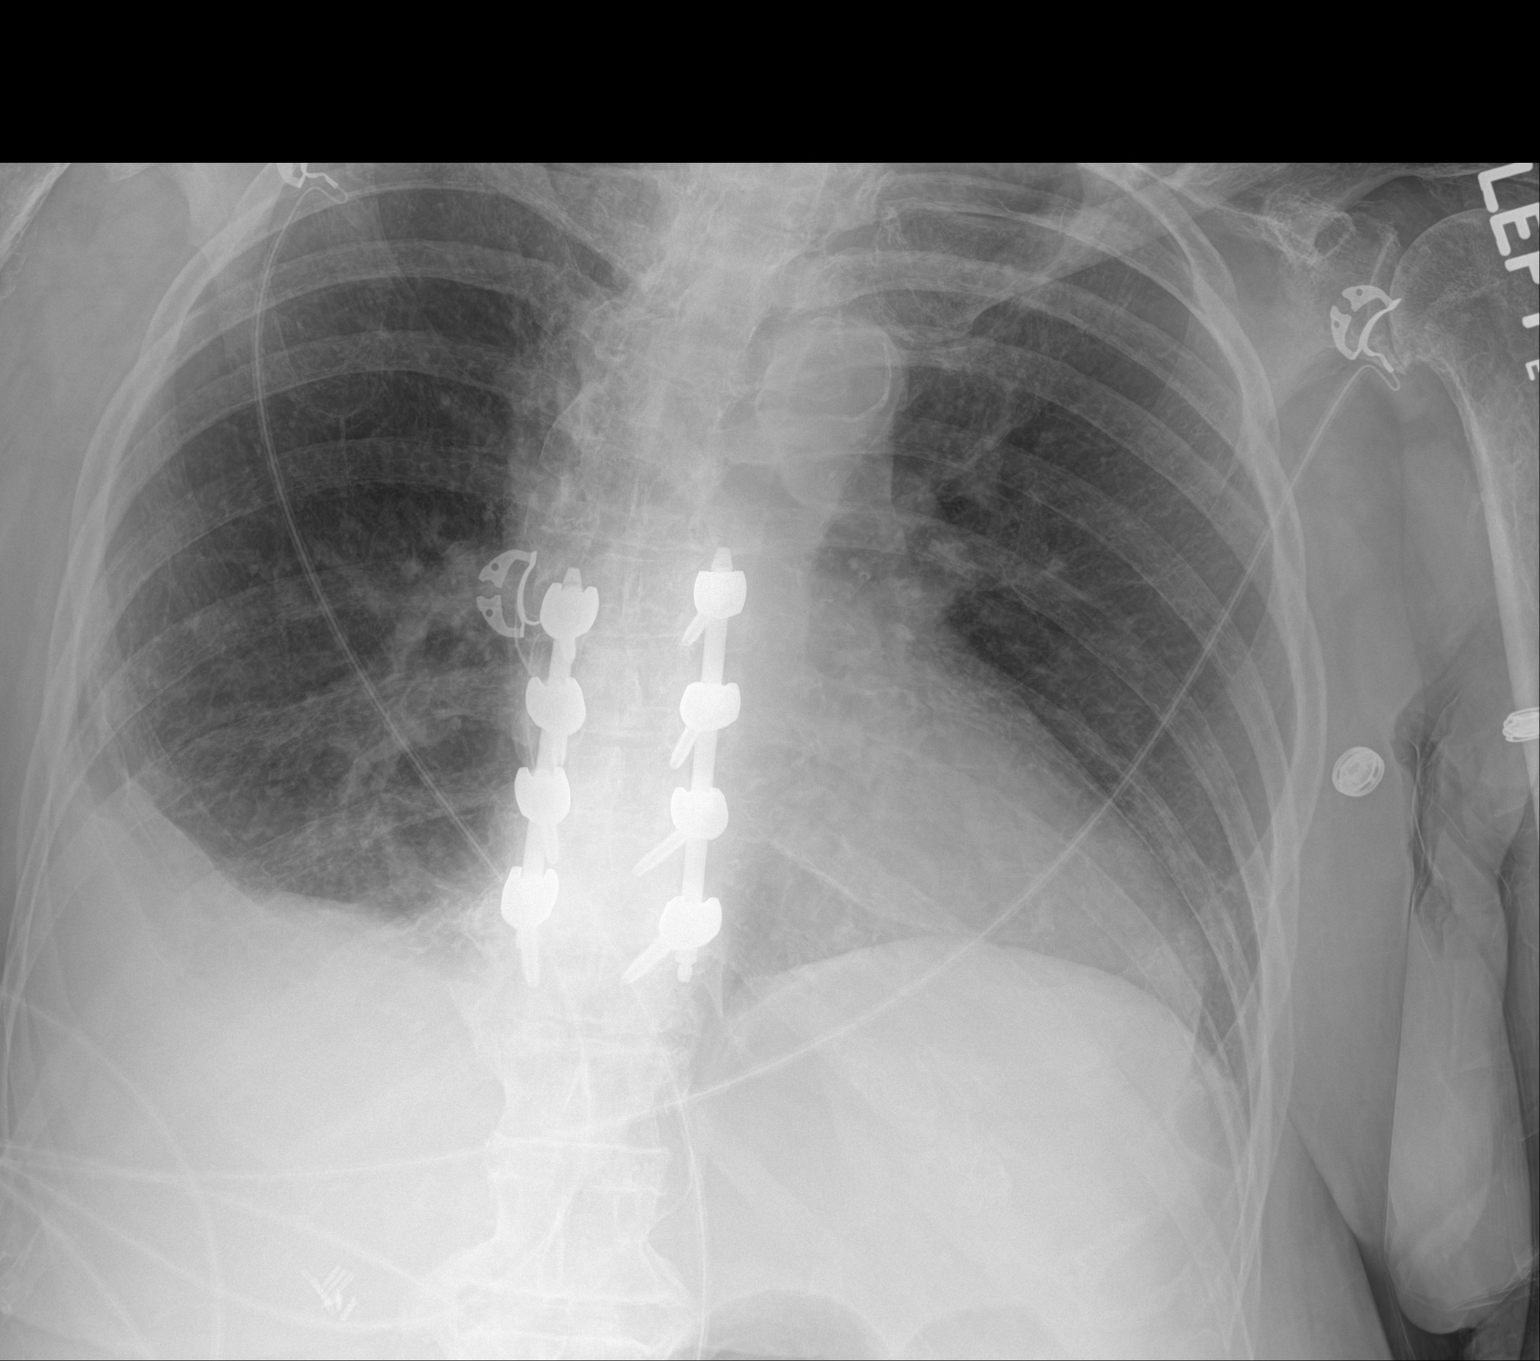

[1 of 1 positions shown; findings below may reference images not displayed]

FINDINGS: Single frontal view of the chest demonstrates a stable cardiac
silhouette. Atherosclerosis of the aortic arch unchanged. Stable
small right pleural effusion. Underlying right basilar consolidation
likely atelectasis. No new airspace disease. No pneumothorax. No
acute bony abnormalities. Stable postsurgical changes from lower
thoracic fusion.
IMPRESSION: 1. Stable small right pleural effusion and right basilar
atelectasis.
2. Otherwise no acute intrathoracic process.

## 2021-06-03 IMAGING — CT CT T SPINE W/O CM
3 of 4 series · 9 of 33 positions shown, 10 images · non-contrast
Comparison: CT chest [DATE], ultrasound thyroid [DATE]

CLINICAL DATA: Back trauma, no prior imaging (Age >= 16y).  Fall

EXAM:
CT THORACIC SPINE WITHOUT CONTRAST
TECHNIQUE: Multidetector CT images of the thoracic were obtained using the
standard protocol without intravenous contrast.
RADIATION DOSE REDUCTION: This exam was performed according to the
departmental dose-optimization program which includes automated
exposure control, adjustment of the mA and/or kV according to
patient size and/or use of iterative reconstruction technique.

[Series 4: t-spine 2.0 st · axial · 0.44mm/px · z∈[-419,-419]mm · 1 of 160 slices shown, 2 images]
[im 80/160  soft-tissue]
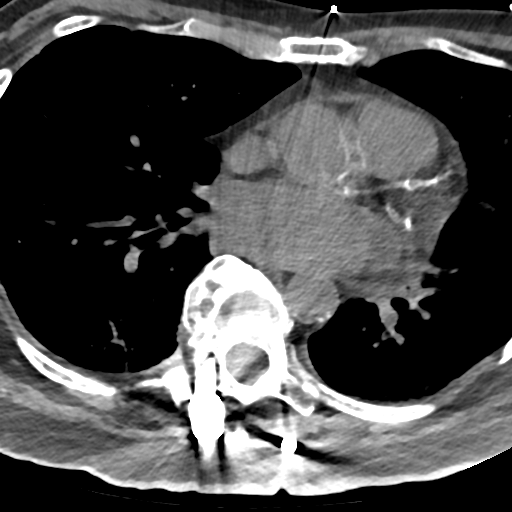
[im 80/160  bone]
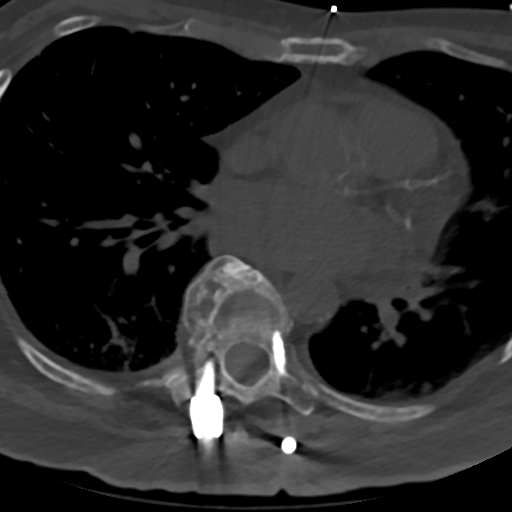

[Series 8: t-spine 2.0 cor bone · coronal · 0.34mm/px · 3 of 81 slices shown]
[im 17/81  bone]
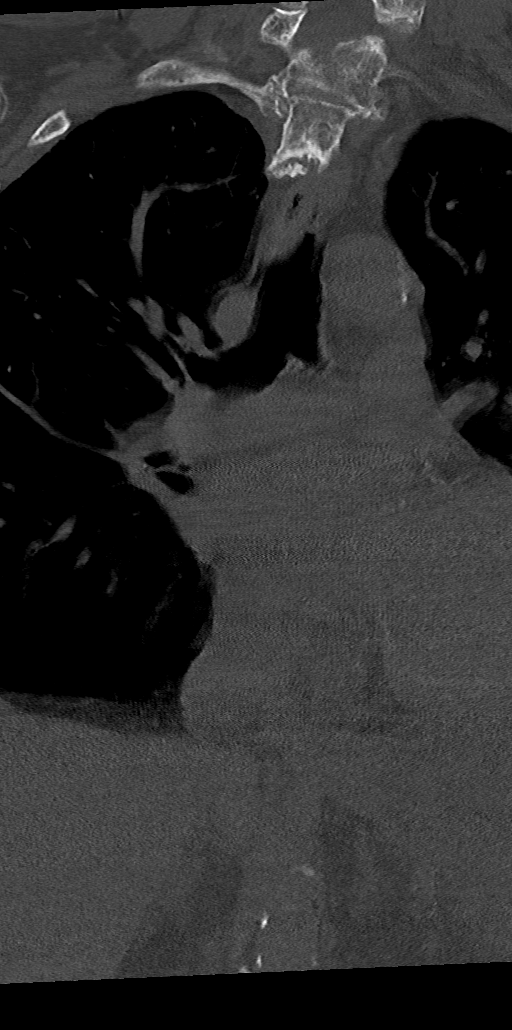
[im 33/81  bone]
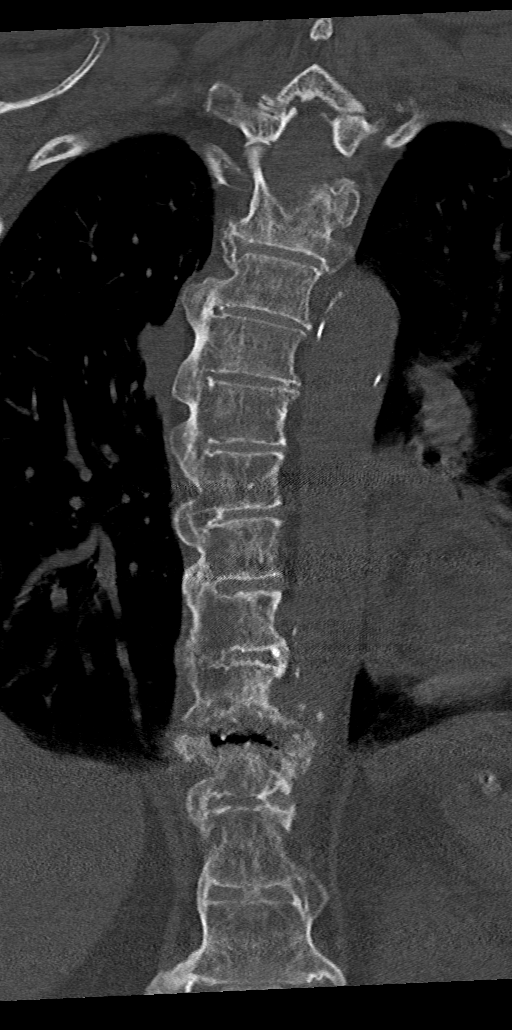
[im 49/81  bone]
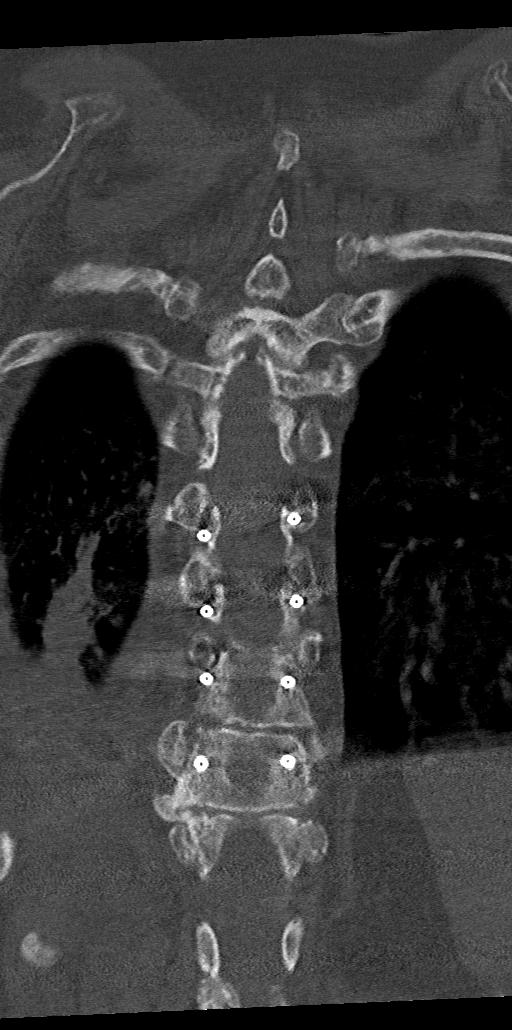

[Series 9: t-spine 2.0 sag bone · sagittal · 0.32mm/px · 5 of 87 slices shown]
[im 29/87  bone]
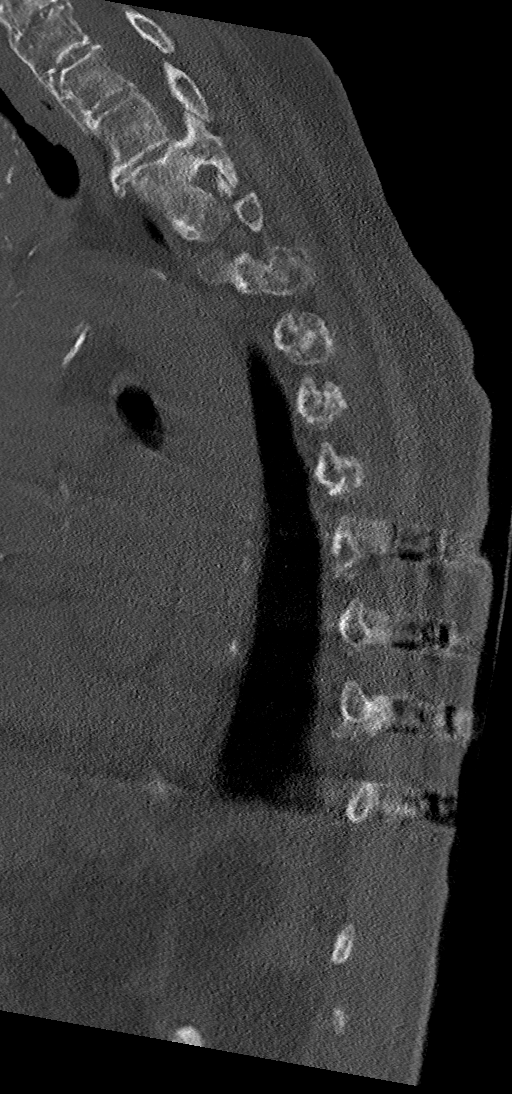
[im 36/87  bone]
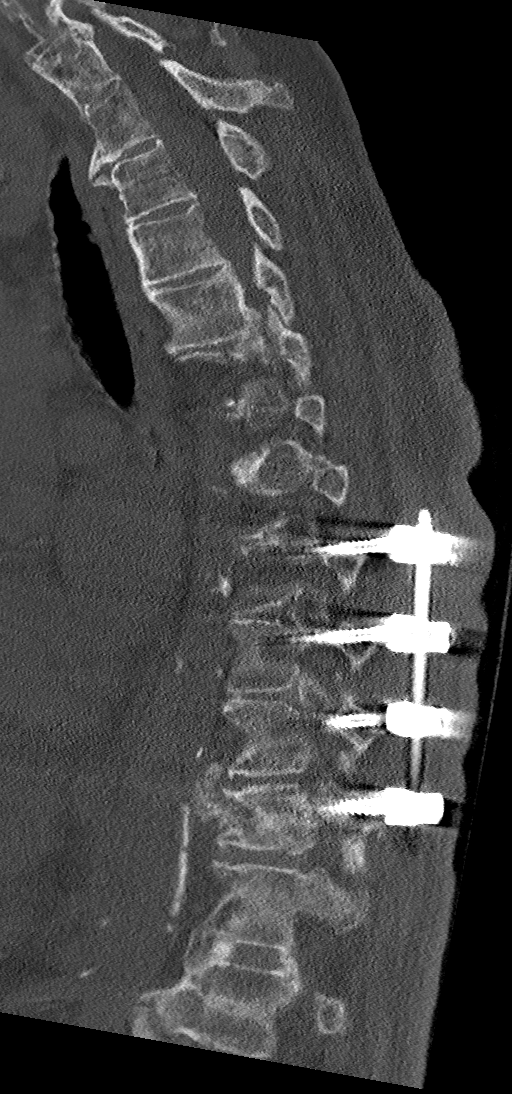
[im 44/87  bone]
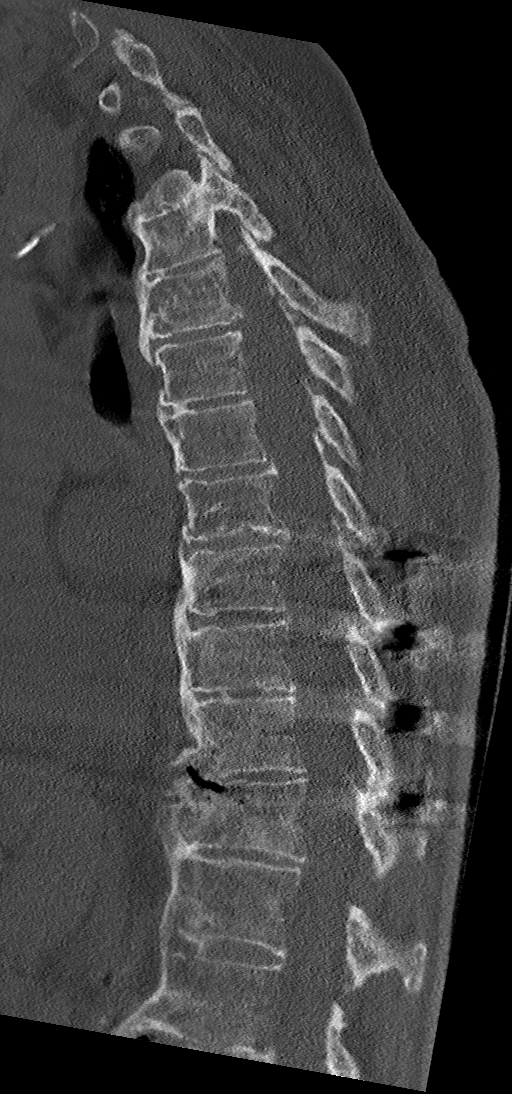
[im 51/87  bone]
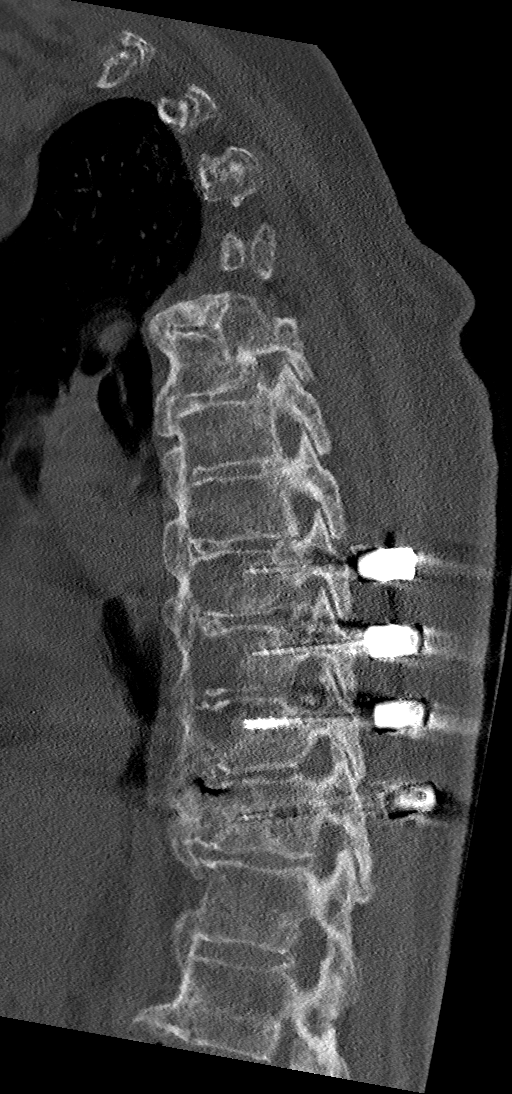
[im 58/87  bone]
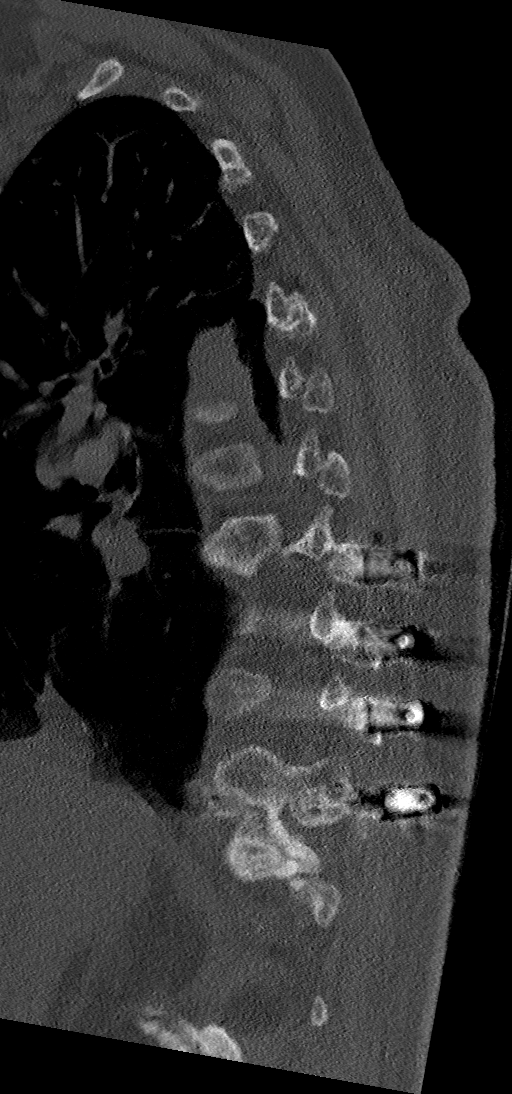

[9 of 33 positions shown; findings below may reference images not displayed]

FINDINGS: Alignment: Slight dextroscoliosis of the mid to upper thoracic
spine.

Vertebrae: Diffusely decreased bone density. Multilevel severe
degenerative changes of the spine with bridging osteophyte
formation. Redemonstration of T8 through T11 posterolateral fusion
surgical hardware with no CT findings of surgical hardware
complication. Redemonstration of old T10-T11 fracture. No acute
fracture or focal pathologic process.

Paraspinal and other soft tissues: Negative.

Disc levels: Disc space vacuum phenomenon at the T10-T11 levels.

Other:

Right lower lobe spiculated bilobed pulmonary nodule measuring 2.8 x
1.6 cm. Adjacent 1.8 x 1.1 cm pulmonary nodule.

Trace right pleural effusion.

Redemonstration of posterior right second and third rib lytic
lesions ([DATE], 18).

Old healed right posterior eleventh rib fracture.

Redemonstration of an enlarged and heterogeneous right thyroid gland
and isthmus gland. Associated curvilinear calcifications are again
noted.

Atherosclerotic plaque.  Four-vessel coronary artery calcifications.
IMPRESSION: 1. No acute displaced fracture or traumatic listhesis of the
thoracic spine in a patient with diffusely decreased bone density,
multilevel severe degenerative changes of the spine, and T8 - T11
posterolateral surgical hardware fixation of prior unstable T10-T11
fracture.
2. Right lower lobe spiculated bilobed pulmonary nodule measuring
2.8 x 1.6 cm. Adjacent 1.8 x 1.1 cm pulmonary nodule. Recommend
prompt non-contrast Chest CT for further evaluation.These guidelines
do not apply to immunocompromised patients and patients with cancer.
Follow up in patients with significant comorbidities as clinically
warranted. For lung cancer screening, adhere to Lung-RADS
guidelines. Reference: Radiology. [NG]; 284(1):228-43.
3. Trace right pleural effusion.
4. Persistent indeterminate posterior right second and third rib
lytic lesions.
5. Enlarged and heterogeneous right thyroid gland and isthmus gland.
Associated curvilinear calcifications are again noted. This has been
evaluated on previous imaging. (ref: [HOSPITAL]. [NG]
6. Aortic Atherosclerosis ([NG]-[NG]). Including four-vessel
coronary artery calcifications.

## 2021-06-03 IMAGING — CT CT CERVICAL SPINE W/O CM
2 of 3 series · 11 of 28 positions shown, 14 images · non-contrast
Comparison: CT chest [DATE].

CLINICAL DATA: Fall



[Series 4: c_spine 2.0 orthogonals · axial · 0.21mm/px · z∈[-299,-227]mm · 6 of 87 slices shown, 8 images]
[im 13/87  soft-tissue]
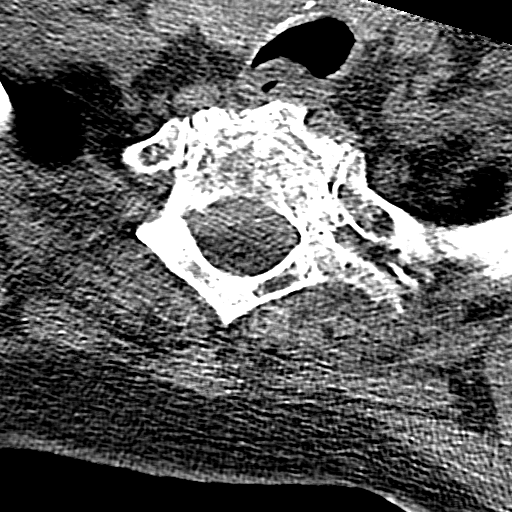
[im 13/87  bone]
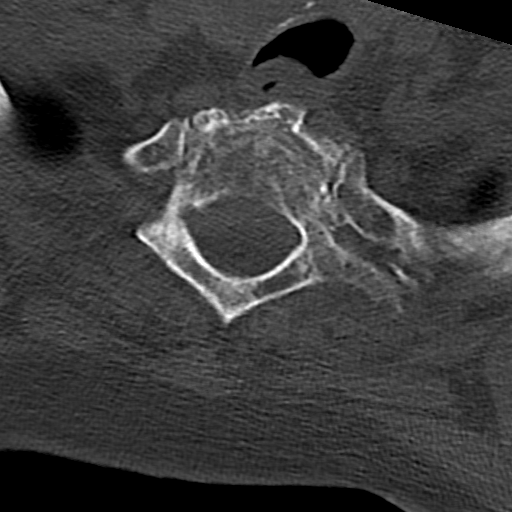
[im 25/87  bone]
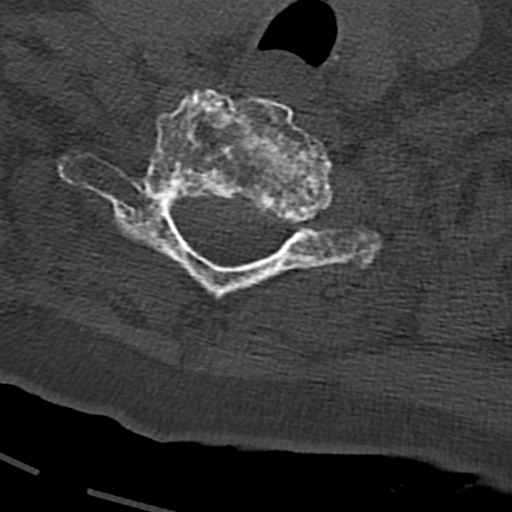
[im 37/87  bone]
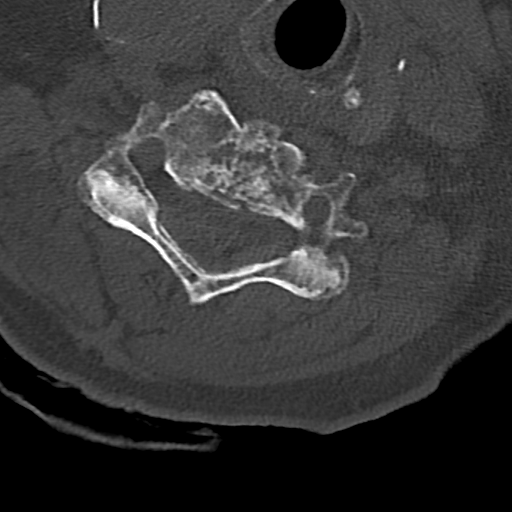
[im 50/87  bone]
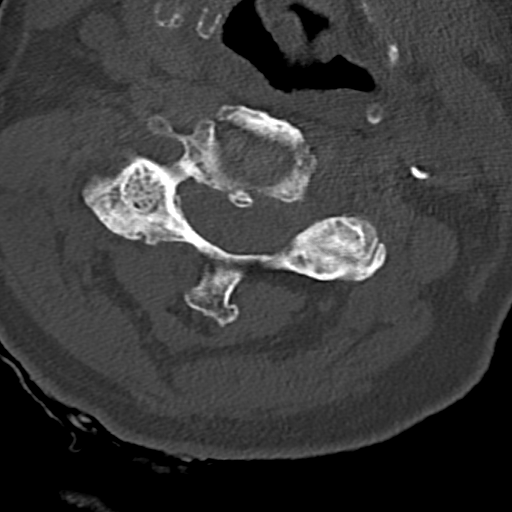
[im 62/87  soft-tissue]
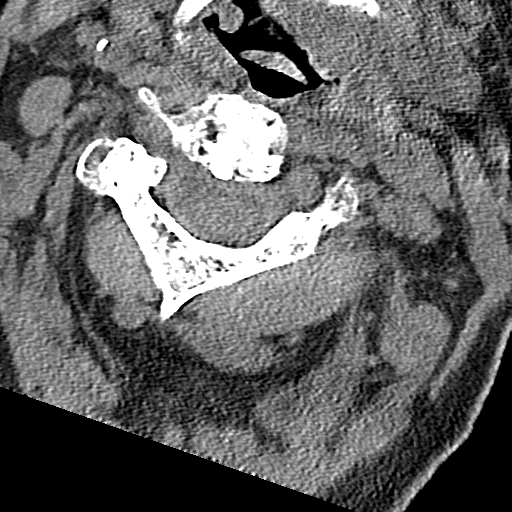
[im 62/87  bone]
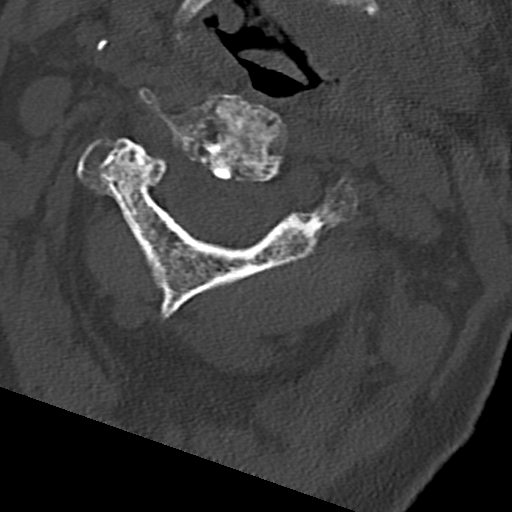
[im 74/87  bone]
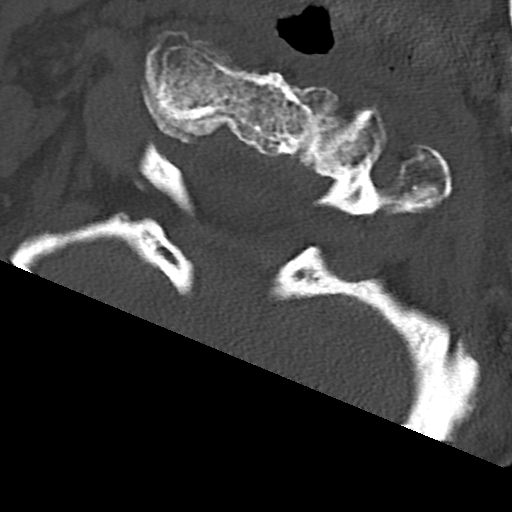

[Series 9: c_spine 2.0 sag bone · sagittal · 0.27mm/px · 5 of 57 slices shown, 6 images]
[im 19/57  bone]
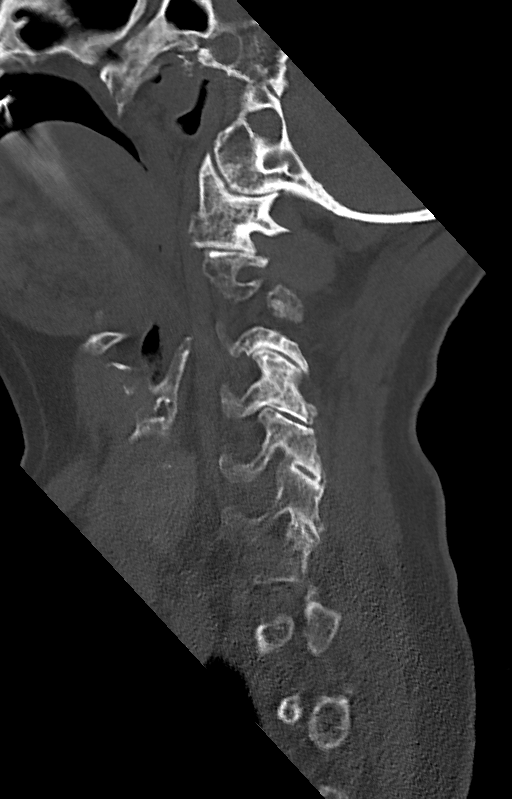
[im 24/57  bone]
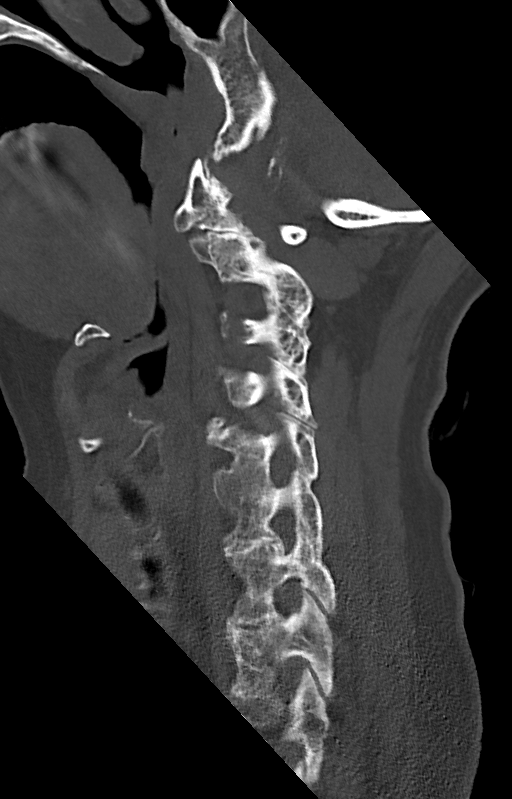
[im 29/57  soft-tissue]
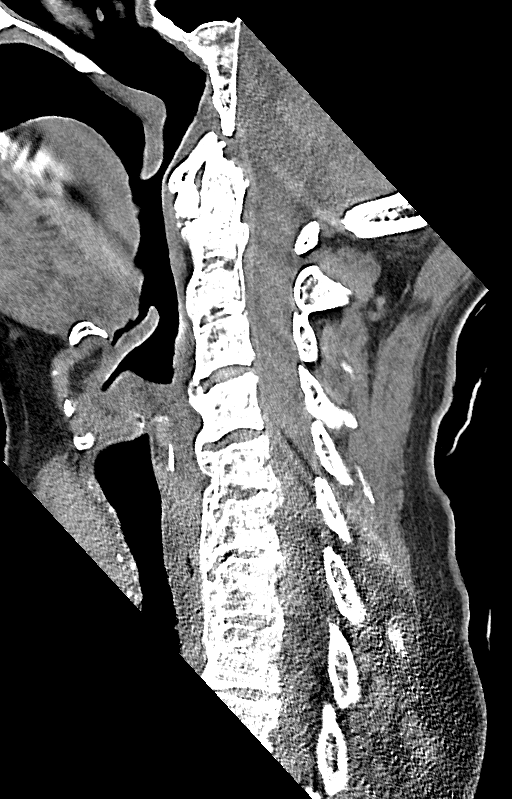
[im 29/57  bone]
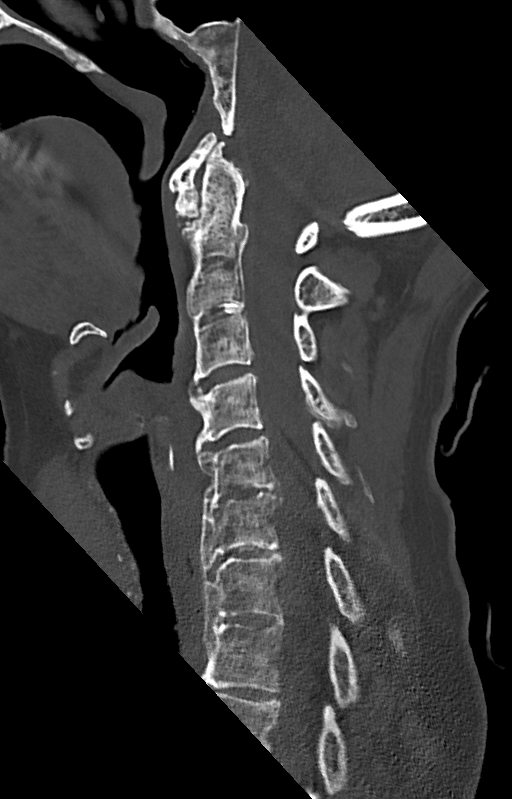
[im 33/57  bone]
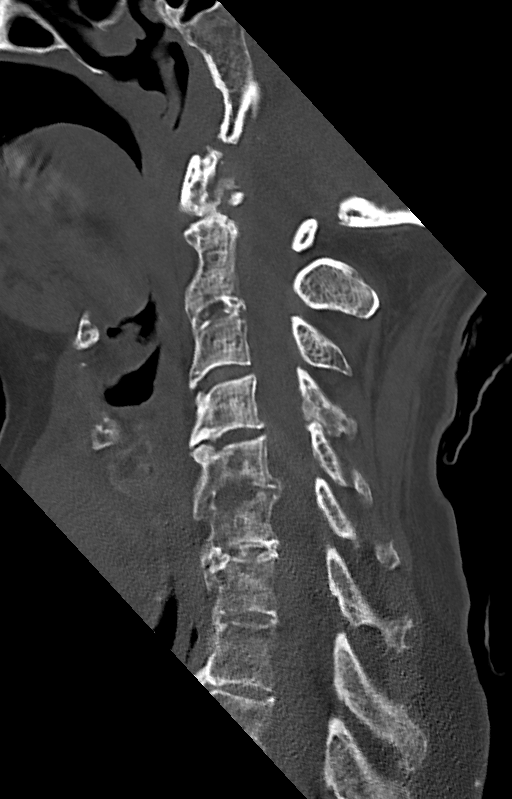
[im 38/57  bone]
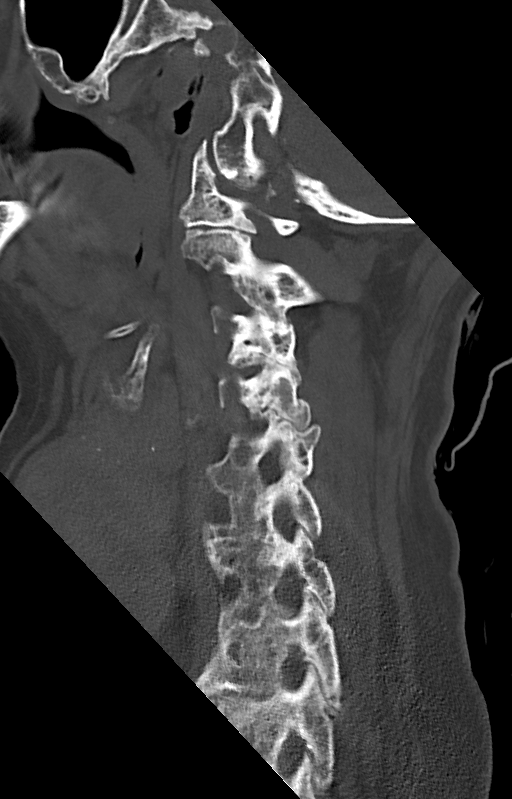

[11 of 28 positions shown; findings below may reference images not displayed]

FINDINGS: CT HEAD FINDINGS

Brain:

No evidence of large-territorial acute infarction. No parenchymal
hemorrhage. No mass lesion. No extra-axial collection.

No mass effect or midline shift. No hydrocephalus. Basilar cisterns
are patent.

Vascular: No hyperdense vessel.

Skull: No acute fracture or focal lesion.

Sinuses/Orbits: Paranasal sinuses and mastoid air cells are clear.
The orbits are unremarkable.

Other: Mild left parietal scalp edema with no large hematoma
formation ([DATE]). Partially visualized vertex scalp soft tissue
density with calcification ([DATE]).

CT CERVICAL SPINE FINDINGS

Alignment: Straightening of the normal cervical lordosis likely due
to positioning and degenerative changes.

Skull base and vertebrae: Diffusely decreased bone density.
Multilevel severe degenerative changes and fusion of the spine.
Multilevel severe osseous neural foraminal stenosis along the left
C2-C3, C3-C4, C4-C5 levels. No severe osseous central canal
stenosis. no acute fracture. No aggressive appearing focal osseous
lesion or focal pathologic process.

Soft tissues and spinal canal: No prevertebral fluid or swelling. No
visible canal hematoma.

Upper chest: Right apex ground-glass pulmonary micronodule.

Other: Persistent (from [DATE]) indeterminate lytic appearing
lesion of the posterior right second and third ribs ([DATE], 56).
IMPRESSION: 1. No acute intracranial abnormality.
2. No acute displaced fracture or traumatic listhesis of the
cervical spine.
3. Multilevel severe osseous neural foraminal stenosis along the
left C2-C3, C3-C4, C4-C5 levels. No severe osseous central canal
stenosis.
4. Persistent indeterminate lytic appearing lesion of the posterior
right second and third ribs.
5. artially visualized vertex scalp soft tissue density with
calcification. Correlate with physical exam.

## 2021-06-03 MED ORDER — INSULIN ASPART 100 UNIT/ML IV SOLN
5.0000 [IU] | Freq: Once | INTRAVENOUS | Status: AC
  Administered 2021-06-04: 5 [IU] via INTRAVENOUS

## 2021-06-03 MED ORDER — DEXTROSE 50 % IV SOLN
1.0000 | Freq: Once | INTRAVENOUS | Status: AC
  Administered 2021-06-04: 50 mL via INTRAVENOUS
  Filled 2021-06-03: qty 50

## 2021-06-03 MED ORDER — ALBUTEROL SULFATE (2.5 MG/3ML) 0.083% IN NEBU
10.0000 mg | INHALATION_SOLUTION | Freq: Once | RESPIRATORY_TRACT | Status: AC
  Administered 2021-06-04: 10 mg via RESPIRATORY_TRACT
  Filled 2021-06-03: qty 12

## 2021-06-03 MED ORDER — CALCIUM GLUCONATE 10 % IV SOLN
1.0000 g | Freq: Once | INTRAVENOUS | Status: AC
  Administered 2021-06-04: 1 g via INTRAVENOUS
  Filled 2021-06-03: qty 10

## 2021-06-03 MED ORDER — TETANUS-DIPHTH-ACELL PERTUSSIS 5-2.5-18.5 LF-MCG/0.5 IM SUSY
0.5000 mL | PREFILLED_SYRINGE | Freq: Once | INTRAMUSCULAR | Status: AC
  Administered 2021-06-04: 0.5 mL via INTRAMUSCULAR
  Filled 2021-06-03: qty 0.5

## 2021-06-03 MED ORDER — SODIUM ZIRCONIUM CYCLOSILICATE 10 G PO PACK
10.0000 g | PACK | Freq: Once | ORAL | Status: AC
  Administered 2021-06-04: 10 g via ORAL
  Filled 2021-06-03: qty 1

## 2021-06-03 NOTE — ED Triage Notes (Signed)
Patient came from home. Fall witnessed by husband. Fall from second step backwards. Positive head strike, negative LOC. Blood noted to back of head.  ?

## 2021-06-03 NOTE — Progress Notes (Signed)
Orthopedic Tech Progress Note ?Patient Details:  ?Jacqueline Richmond ?[birth year] ?[id number] ? ?Patient ID: Jacqueline Richmond, female   DOB: [birth year], 76 y.o.   MRN: [id number] ?I attended trauma page. ?[name] ?06/03/2021, 11:36 PM ? ?

## 2021-06-04 ENCOUNTER — Other Ambulatory Visit (HOSPITAL_COMMUNITY): Payer: Self-pay

## 2021-06-04 ENCOUNTER — Emergency Department (HOSPITAL_COMMUNITY): Payer: Medicare PPO

## 2021-06-04 ENCOUNTER — Encounter (HOSPITAL_COMMUNITY): Payer: Self-pay | Admitting: Internal Medicine

## 2021-06-04 ENCOUNTER — Inpatient Hospital Stay (HOSPITAL_COMMUNITY): Payer: Medicare PPO

## 2021-06-04 ENCOUNTER — Other Ambulatory Visit: Payer: Self-pay

## 2021-06-04 DIAGNOSIS — Z20822 Contact with and (suspected) exposure to covid-19: Secondary | ICD-10-CM | POA: Diagnosis present

## 2021-06-04 DIAGNOSIS — L89616 Pressure-induced deep tissue damage of right heel: Secondary | ICD-10-CM | POA: Diagnosis present

## 2021-06-04 DIAGNOSIS — Z885 Allergy status to narcotic agent status: Secondary | ICD-10-CM | POA: Diagnosis not present

## 2021-06-04 DIAGNOSIS — I129 Hypertensive chronic kidney disease with stage 1 through stage 4 chronic kidney disease, or unspecified chronic kidney disease: Secondary | ICD-10-CM | POA: Diagnosis present

## 2021-06-04 DIAGNOSIS — R911 Solitary pulmonary nodule: Secondary | ICD-10-CM | POA: Diagnosis present

## 2021-06-04 DIAGNOSIS — E8809 Other disorders of plasma-protein metabolism, not elsewhere classified: Secondary | ICD-10-CM | POA: Diagnosis present

## 2021-06-04 DIAGNOSIS — L89896 Pressure-induced deep tissue damage of other site: Secondary | ICD-10-CM | POA: Diagnosis present

## 2021-06-04 DIAGNOSIS — J309 Allergic rhinitis, unspecified: Secondary | ICD-10-CM | POA: Diagnosis present

## 2021-06-04 DIAGNOSIS — W010XXA Fall on same level from slipping, tripping and stumbling without subsequent striking against object, initial encounter: Secondary | ICD-10-CM | POA: Diagnosis present

## 2021-06-04 DIAGNOSIS — E1151 Type 2 diabetes mellitus with diabetic peripheral angiopathy without gangrene: Secondary | ICD-10-CM | POA: Diagnosis present

## 2021-06-04 DIAGNOSIS — E1122 Type 2 diabetes mellitus with diabetic chronic kidney disease: Secondary | ICD-10-CM | POA: Diagnosis present

## 2021-06-04 DIAGNOSIS — Z23 Encounter for immunization: Secondary | ICD-10-CM | POA: Diagnosis present

## 2021-06-04 DIAGNOSIS — E872 Acidosis, unspecified: Secondary | ICD-10-CM | POA: Diagnosis present

## 2021-06-04 DIAGNOSIS — Z86010 Personal history of colonic polyps: Secondary | ICD-10-CM | POA: Diagnosis not present

## 2021-06-04 DIAGNOSIS — N179 Acute kidney failure, unspecified: Secondary | ICD-10-CM | POA: Diagnosis present

## 2021-06-04 DIAGNOSIS — Z91041 Radiographic dye allergy status: Secondary | ICD-10-CM | POA: Diagnosis not present

## 2021-06-04 DIAGNOSIS — N184 Chronic kidney disease, stage 4 (severe): Secondary | ICD-10-CM | POA: Diagnosis present

## 2021-06-04 DIAGNOSIS — J90 Pleural effusion, not elsewhere classified: Secondary | ICD-10-CM | POA: Diagnosis present

## 2021-06-04 DIAGNOSIS — E860 Dehydration: Secondary | ICD-10-CM | POA: Diagnosis present

## 2021-06-04 DIAGNOSIS — Z888 Allergy status to other drugs, medicaments and biological substances status: Secondary | ICD-10-CM | POA: Diagnosis not present

## 2021-06-04 DIAGNOSIS — R532 Functional quadriplegia: Secondary | ICD-10-CM | POA: Diagnosis present

## 2021-06-04 DIAGNOSIS — M899 Disorder of bone, unspecified: Secondary | ICD-10-CM | POA: Diagnosis present

## 2021-06-04 DIAGNOSIS — E119 Type 2 diabetes mellitus without complications: Secondary | ICD-10-CM | POA: Diagnosis not present

## 2021-06-04 DIAGNOSIS — E875 Hyperkalemia: Secondary | ICD-10-CM | POA: Diagnosis present

## 2021-06-04 DIAGNOSIS — R5381 Other malaise: Secondary | ICD-10-CM | POA: Diagnosis present

## 2021-06-04 LAB — URINALYSIS, ROUTINE W REFLEX MICROSCOPIC
Bilirubin Urine: NEGATIVE
Glucose, UA: NEGATIVE mg/dL
Hgb urine dipstick: NEGATIVE
Ketones, ur: NEGATIVE mg/dL
Nitrite: NEGATIVE
Protein, ur: 100 mg/dL — AB
Specific Gravity, Urine: 1.012 (ref 1.005–1.030)
pH: 8 (ref 5.0–8.0)

## 2021-06-04 LAB — GLUCOSE, CAPILLARY
Glucose-Capillary: 91 mg/dL (ref 70–99)
Glucose-Capillary: 77 mg/dL (ref 70–99)

## 2021-06-04 LAB — BASIC METABOLIC PANEL
Anion gap: 7 (ref 5–15)
Anion gap: 7 (ref 5–15)
Anion gap: 9 (ref 5–15)
BUN: 60 mg/dL — ABNORMAL HIGH (ref 8–23)
BUN: 57 mg/dL — ABNORMAL HIGH (ref 8–23)
BUN: 56 mg/dL — ABNORMAL HIGH (ref 8–23)
CO2: 19 mmol/L — ABNORMAL LOW (ref 22–32)
CO2: 21 mmol/L — ABNORMAL LOW (ref 22–32)
CO2: 19 mmol/L — ABNORMAL LOW (ref 22–32)
Calcium: 9.1 mg/dL (ref 8.9–10.3)
Calcium: 9.5 mg/dL (ref 8.9–10.3)
Calcium: 9 mg/dL (ref 8.9–10.3)
Chloride: 114 mmol/L — ABNORMAL HIGH (ref 98–111)
Chloride: 111 mmol/L (ref 98–111)
Chloride: 110 mmol/L (ref 98–111)
Creatinine, Ser: 3.24 mg/dL — ABNORMAL HIGH (ref 0.44–1.00)
Creatinine, Ser: 3.45 mg/dL — ABNORMAL HIGH (ref 0.44–1.00)
Creatinine, Ser: 3.28 mg/dL — ABNORMAL HIGH (ref 0.44–1.00)
GFR, Estimated: 14 mL/min — ABNORMAL LOW (ref >60)
GFR, Estimated: 13 mL/min — ABNORMAL LOW (ref >60)
GFR, Estimated: 14 mL/min — ABNORMAL LOW (ref >60)
Glucose, Bld: 74 mg/dL (ref 70–99)
Glucose, Bld: 124 mg/dL — ABNORMAL HIGH (ref 70–99)
Glucose, Bld: 114 mg/dL — ABNORMAL HIGH (ref 70–99)
Potassium: 6.7 mmol/L (ref 3.5–5.1)
Potassium: 5.9 mmol/L — ABNORMAL HIGH (ref 3.5–5.1)
Potassium: 5.7 mmol/L — ABNORMAL HIGH (ref 3.5–5.1)
Sodium: 140 mmol/L (ref 135–145)
Sodium: 139 mmol/L (ref 135–145)
Sodium: 138 mmol/L (ref 135–145)

## 2021-06-04 LAB — POTASSIUM: Potassium: 5.9 mmol/L — ABNORMAL HIGH (ref 3.5–5.1)

## 2021-06-04 LAB — COMPREHENSIVE METABOLIC PANEL
ALT: 12 U/L (ref 0–44)
AST: 15 U/L (ref 15–41)
Albumin: 2.7 g/dL — ABNORMAL LOW (ref 3.5–5.0)
Alkaline Phosphatase: 124 U/L (ref 38–126)
Anion gap: 9 (ref 5–15)
BUN: 61 mg/dL — ABNORMAL HIGH (ref 8–23)
CO2: 18 mmol/L — ABNORMAL LOW (ref 22–32)
Calcium: 9.4 mg/dL (ref 8.9–10.3)
Chloride: 111 mmol/L (ref 98–111)
Creatinine, Ser: 3.44 mg/dL — ABNORMAL HIGH (ref 0.44–1.00)
GFR, Estimated: 13 mL/min — ABNORMAL LOW (ref >60)
Glucose, Bld: 76 mg/dL (ref 70–99)
Potassium: 6.6 mmol/L (ref 3.5–5.1)
Sodium: 138 mmol/L (ref 135–145)
Total Bilirubin: 0.5 mg/dL (ref 0.3–1.2)
Total Protein: 7 g/dL (ref 6.5–8.1)

## 2021-06-04 LAB — CBC WITH DIFFERENTIAL/PLATELET
Abs Immature Granulocytes: 0.04 10*3/uL (ref 0.00–0.07)
Basophils Absolute: 0 10*3/uL (ref 0.0–0.1)
Basophils Relative: 0 %
Eosinophils Absolute: 0.1 10*3/uL (ref 0.0–0.5)
Eosinophils Relative: 2 %
HCT: 35.3 % — ABNORMAL LOW (ref 36.0–46.0)
Hemoglobin: 10.7 g/dL — ABNORMAL LOW (ref 12.0–15.0)
Immature Granulocytes: 1 %
Lymphocytes Relative: 18 %
Lymphs Abs: 1.5 10*3/uL (ref 0.7–4.0)
MCH: 24.9 pg — ABNORMAL LOW (ref 26.0–34.0)
MCHC: 30.3 g/dL (ref 30.0–36.0)
MCV: 82.1 fL (ref 80.0–100.0)
Monocytes Absolute: 0.8 10*3/uL (ref 0.1–1.0)
Monocytes Relative: 10 %
Neutro Abs: 5.8 10*3/uL (ref 1.7–7.7)
Neutrophils Relative %: 69 %
Platelets: 154 10*3/uL (ref 150–400)
RBC: 4.3 MIL/uL (ref 3.87–5.11)
RDW: 17.2 % — ABNORMAL HIGH (ref 11.5–15.5)
WBC: 8.3 10*3/uL (ref 4.0–10.5)
nRBC: 0 % (ref 0.0–0.2)

## 2021-06-04 LAB — I-STAT VENOUS BLOOD GAS, ED
Acid-base deficit: 3 mmol/L — ABNORMAL HIGH (ref 0.0–2.0)
Bicarbonate: 22.2 mmol/L (ref 20.0–28.0)
Calcium, Ion: 1.26 mmol/L (ref 1.15–1.40)
HCT: 34 % — ABNORMAL LOW (ref 36.0–46.0)
Hemoglobin: 11.6 g/dL — ABNORMAL LOW (ref 12.0–15.0)
O2 Saturation: 93 %
Potassium: 5.9 mmol/L — ABNORMAL HIGH (ref 3.5–5.1)
Sodium: 141 mmol/L (ref 135–145)
TCO2: 23 mmol/L (ref 22–32)
pCO2, Ven: 39.5 mmHg — ABNORMAL LOW (ref 44–60)
pH, Ven: 7.359 (ref 7.25–7.43)
pO2, Ven: 70 mmHg — ABNORMAL HIGH (ref 32–45)

## 2021-06-04 LAB — CBG MONITORING, ED
Glucose-Capillary: 76 mg/dL (ref 70–99)
Glucose-Capillary: 91 mg/dL (ref 70–99)

## 2021-06-04 LAB — RESP PANEL BY RT-PCR (FLU A&B, COVID) ARPGX2
Influenza A by PCR: NEGATIVE
Influenza B by PCR: NEGATIVE
SARS Coronavirus 2 by RT PCR: NEGATIVE

## 2021-06-04 LAB — CREATININE, URINE, RANDOM: Creatinine, Urine: 35.98 mg/dL

## 2021-06-04 LAB — CK
Total CK: 30 U/L — ABNORMAL LOW (ref 38–234)
Total CK: 29 U/L — ABNORMAL LOW (ref 38–234)

## 2021-06-04 LAB — PHOSPHORUS: Phosphorus: 4.9 mg/dL — ABNORMAL HIGH (ref 2.5–4.6)

## 2021-06-04 LAB — SODIUM, URINE, RANDOM: Sodium, Ur: 126 mmol/L

## 2021-06-04 LAB — MAGNESIUM: Magnesium: 1.9 mg/dL (ref 1.7–2.4)

## 2021-06-04 IMAGING — US US RENAL
1 series · 14 of 25 positions shown · non-contrast
Comparison: CT abdomen [DATE] and chest CT [DATE]

CLINICAL DATA: Acute kidney injury.

EXAM:
RENAL / URINARY TRACT ULTRASOUND COMPLETE

[Series 1: us renal · 14 of 38 slices shown]
[im 1/38]
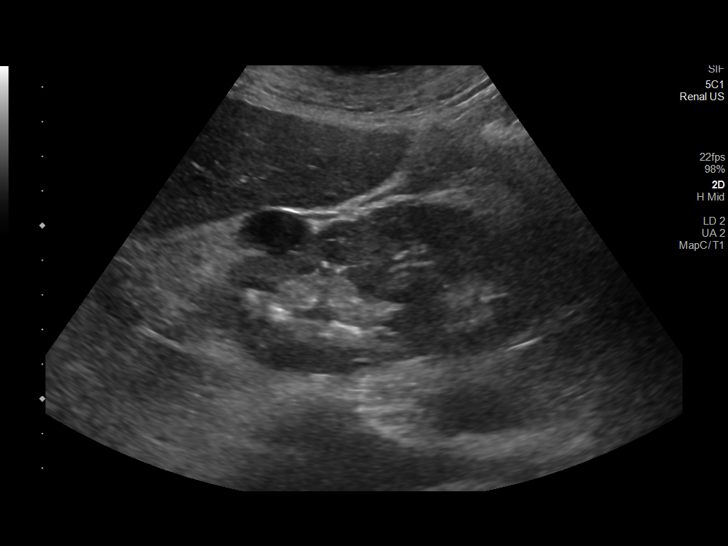
[im 4/38]
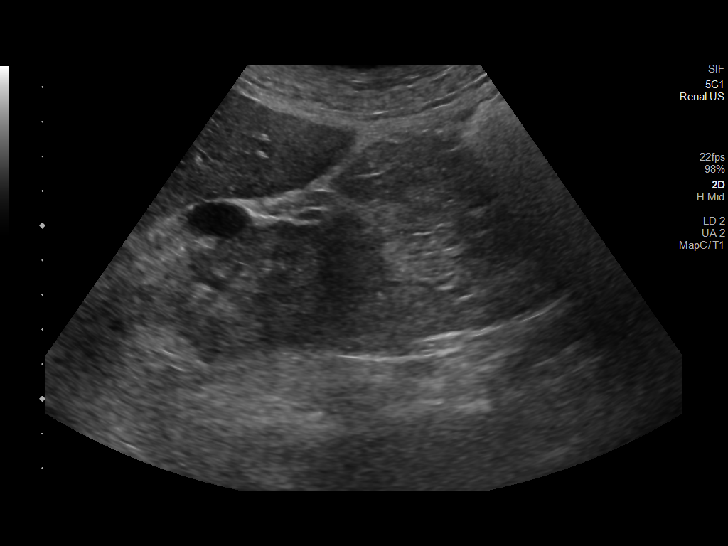
[im 7/38]
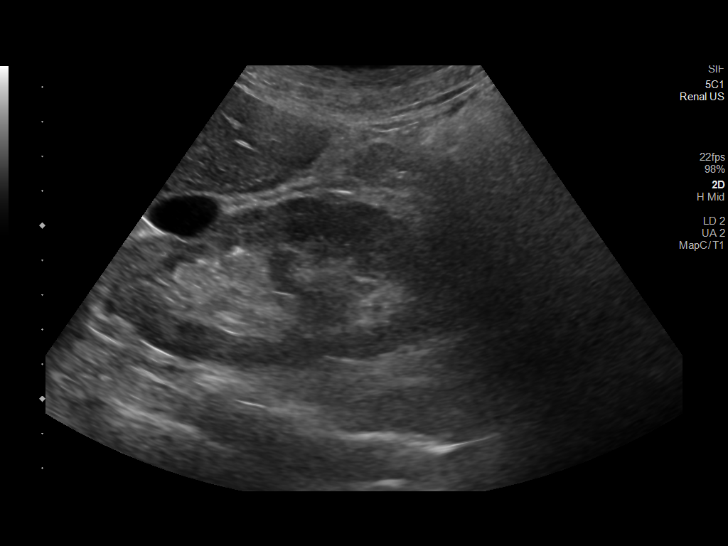
[im 10/38]
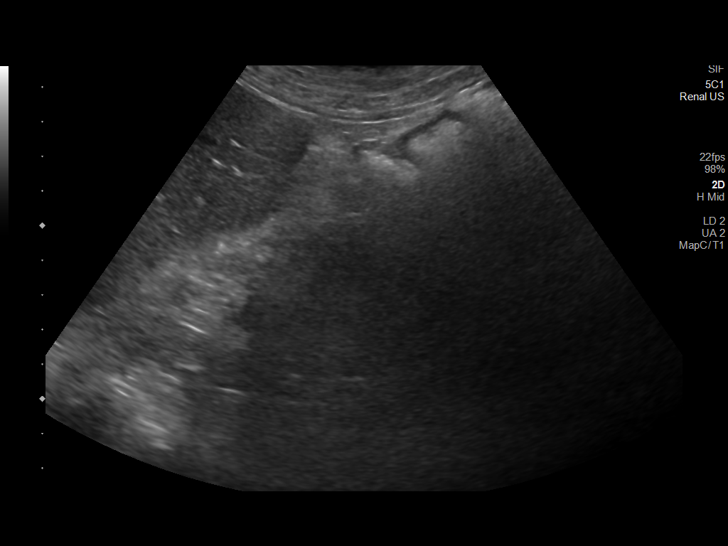
[im 13/38]
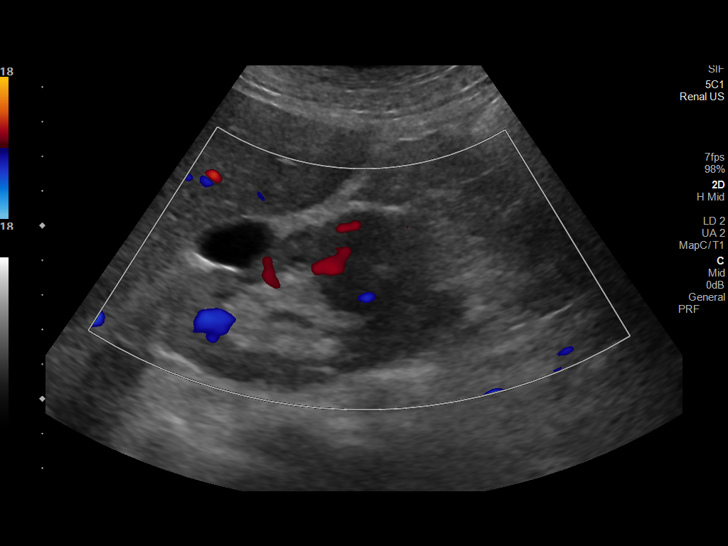
[im 14/38]
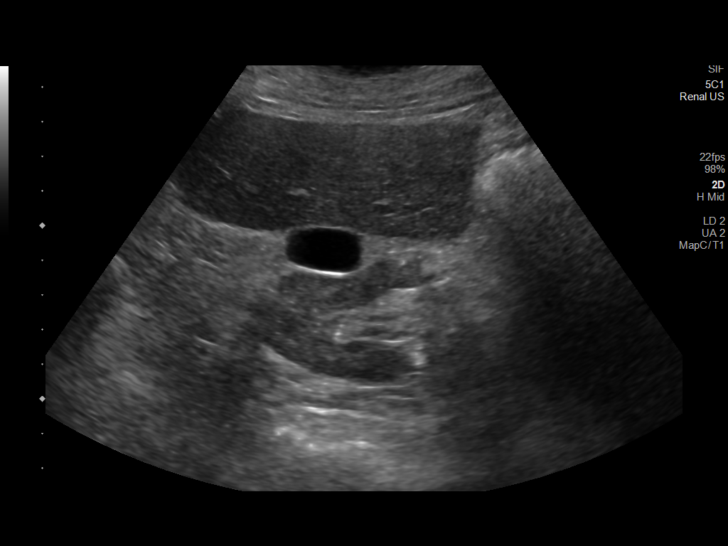
[im 17/38]
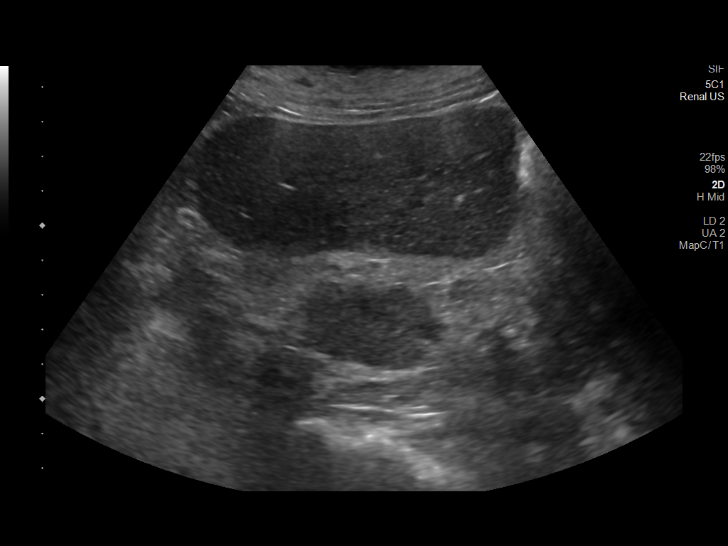
[im 21/38]
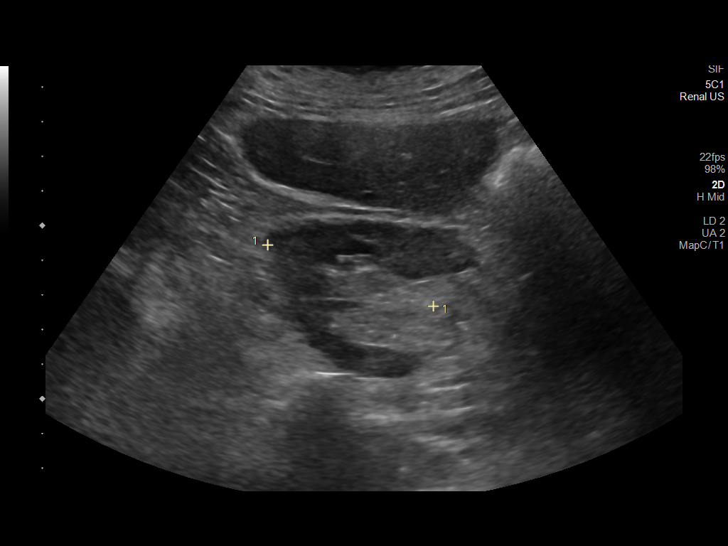
[im 24/38]
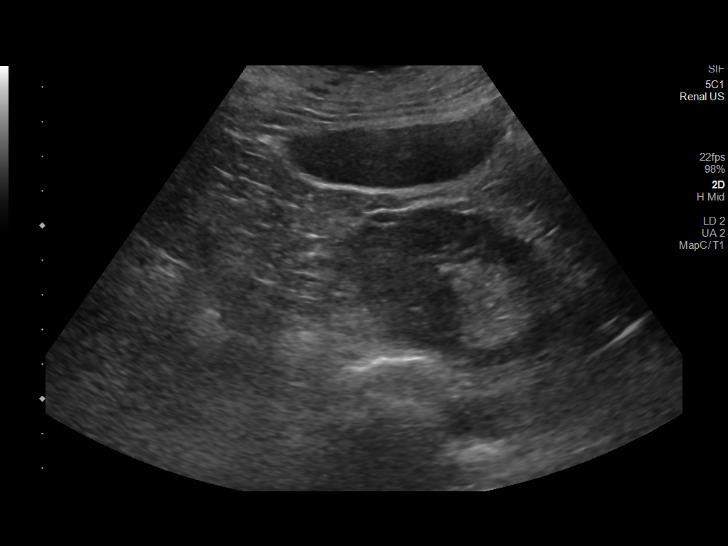
[im 25/38]
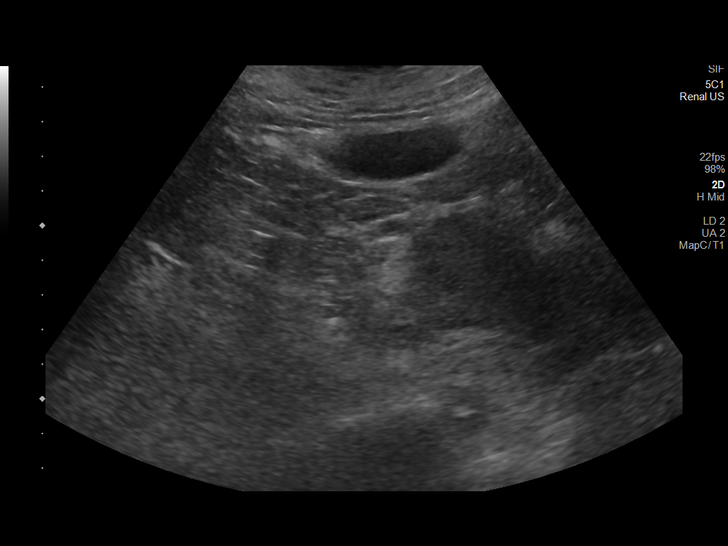
[im 28/38]
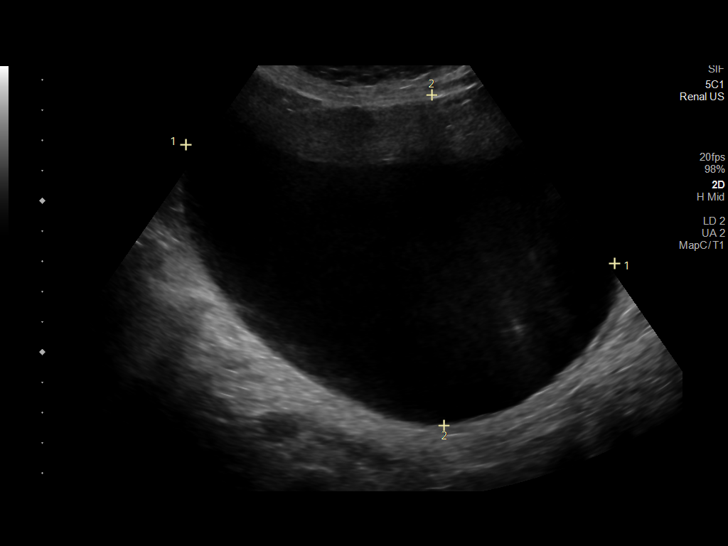
[im 31/38]
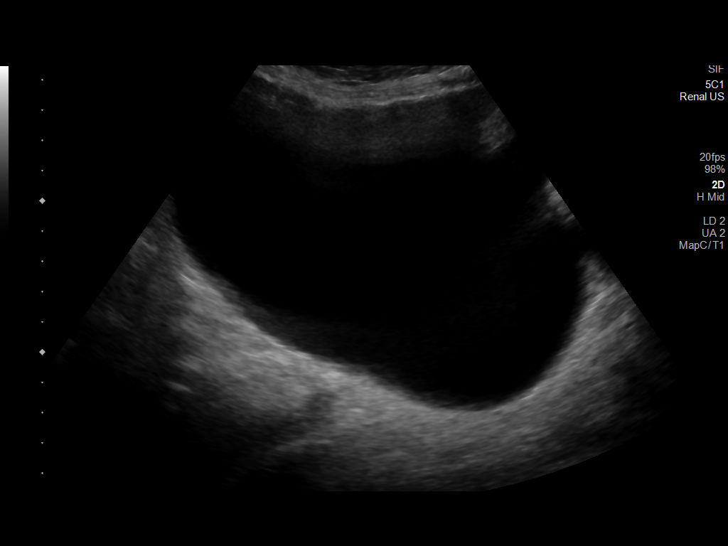
[im 34/38]
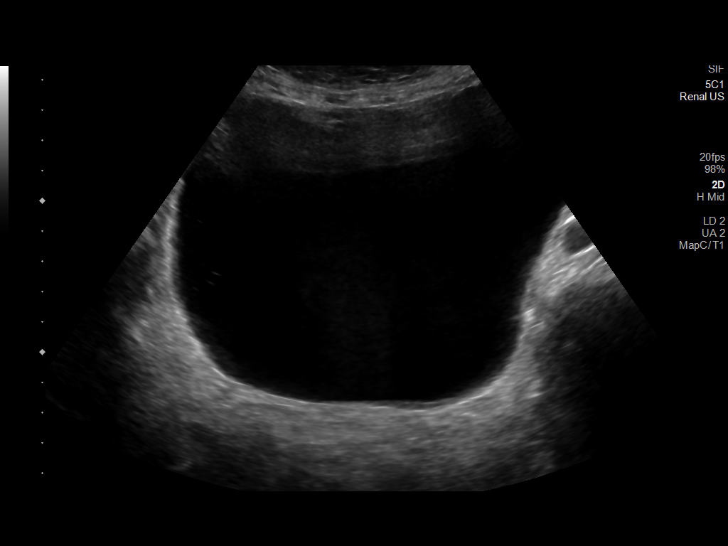
[im 38/38]
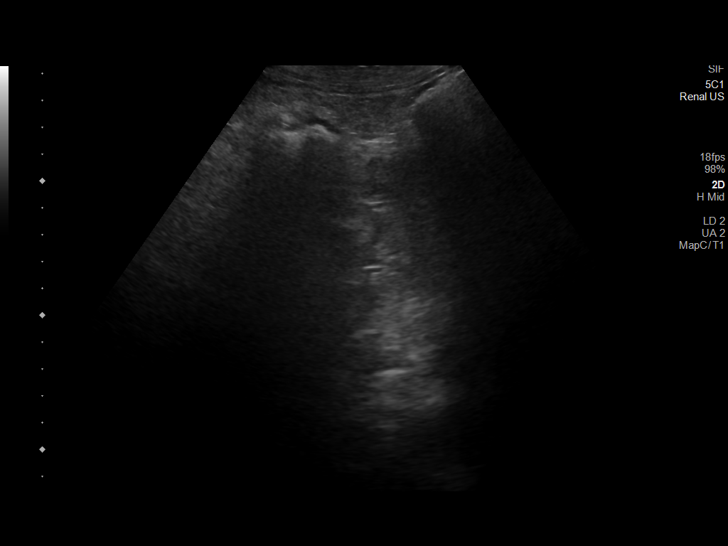

[14 of 25 positions shown; findings below may reference images not displayed]

FINDINGS: Right Kidney:

Renal measurements: 11.2 x 4.9 x 5.1 cm = volume: 147 mL. Normal
echogenicity. Negative for hydronephrosis. Exophytic anechoic cyst
in the right kidney upper pole that measures up to 2.4 cm.

Left Kidney:

Not visualized and obscured by bowel gas. Limited examination
because patient was unable to be moved into a right lateral
decubitus position.

Bladder:

Distended with fluid.  Calculated bladder volume is [U1] mL.

Other:

None.
IMPRESSION: 1. Limited examination because the left kidney could not be
visualized at all.
2. Right kidney has a normal appearance without hydronephrosis.
Simple right renal cyst.
3. Urinary bladder is markedly distended.

## 2021-06-04 IMAGING — CT CT CHEST W/O CM
2 of 4 series · 15 of 36 positions shown, 18 images · non-contrast
Comparison: [DATE], [DATE].

CLINICAL DATA: Lung nodule, high cancer risk.  Fall.



[Series 4: thorax 2.0 · axial · 0.69mm/px · z∈[+744,+1022]mm · 12 of 157 slices shown, 15 images]
[im 9/157  mediastinal]
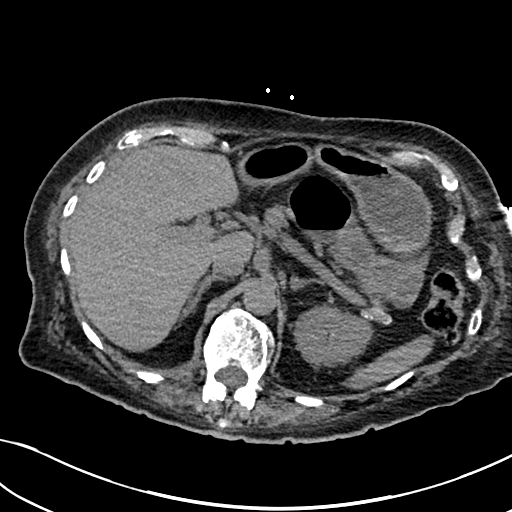
[im 9/157  lung]
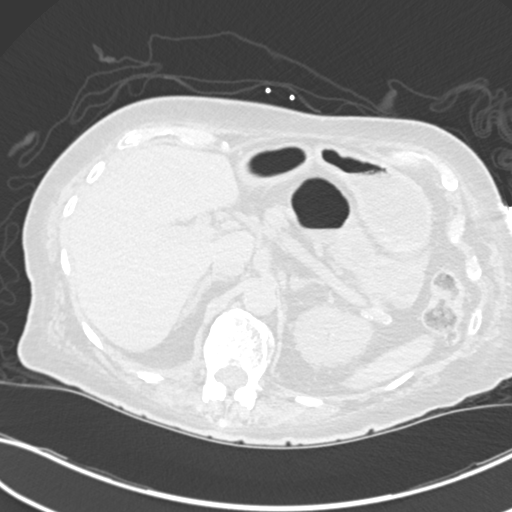
[im 25/157  lung]
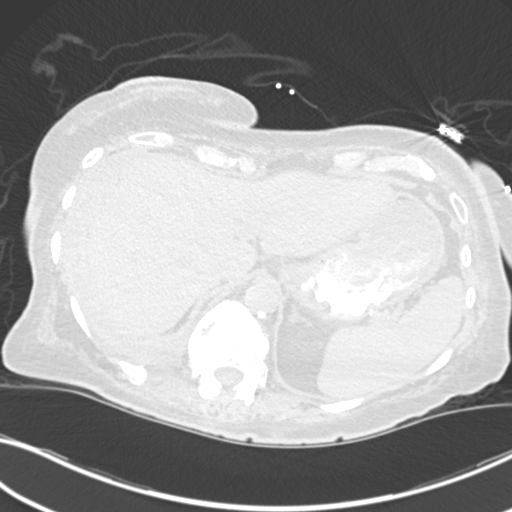
[im 33/157  lung]
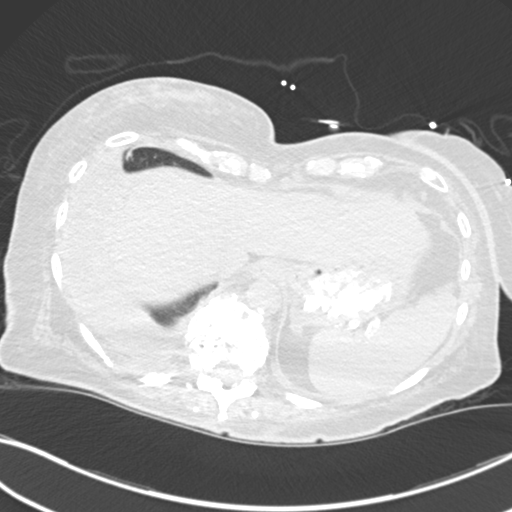
[im 50/157  lung]
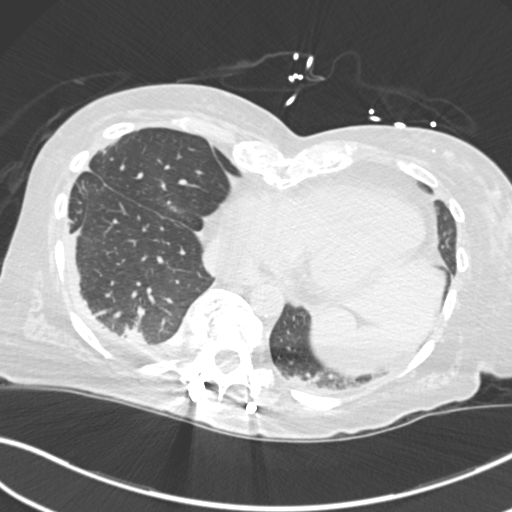
[im 58/157  mediastinal]
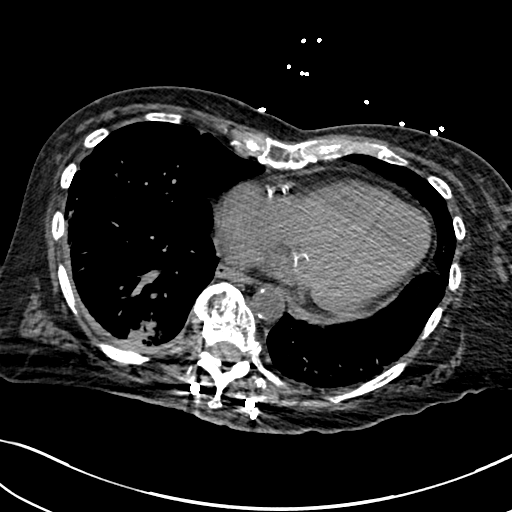
[im 58/157  lung]
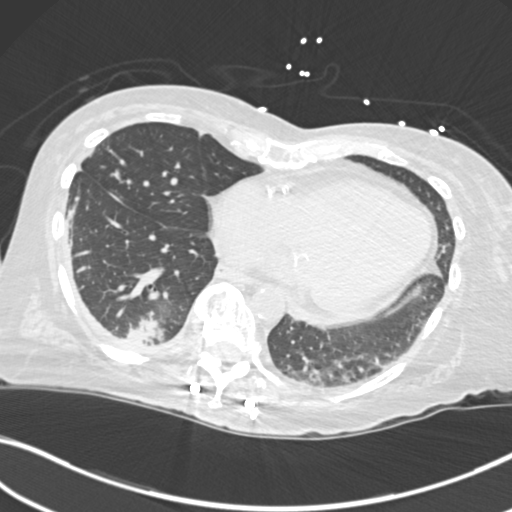
[im 74/157  lung]
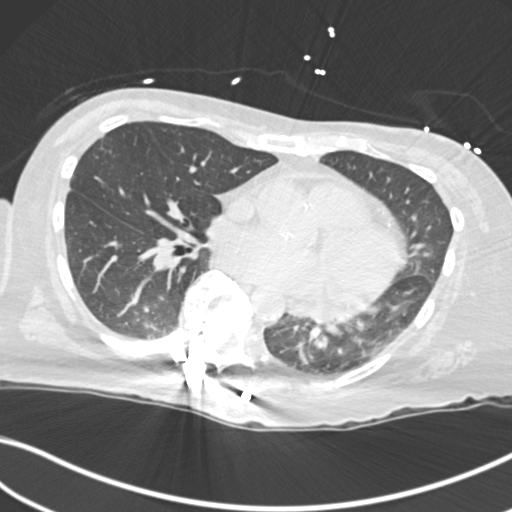
[im 83/157  lung]
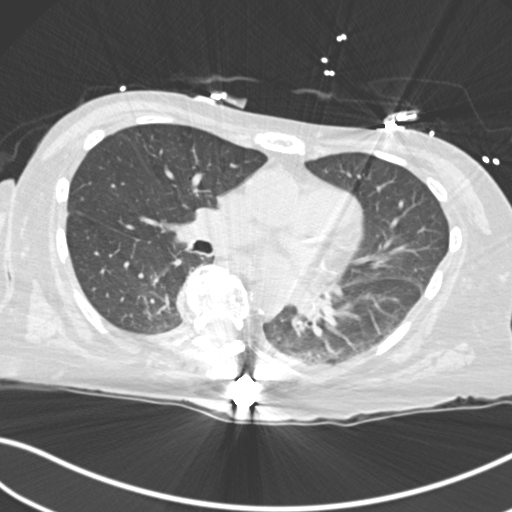
[im 99/157  lung]
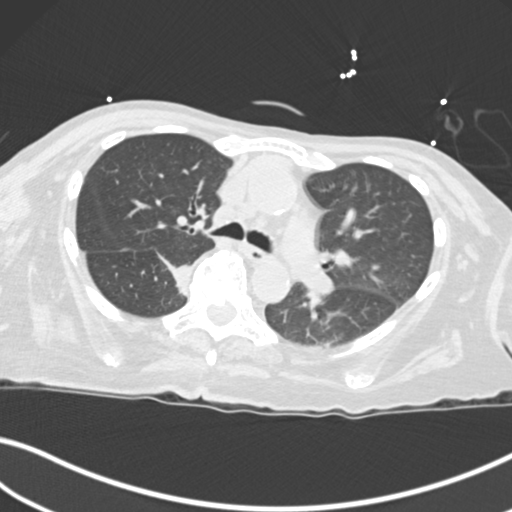
[im 107/157  mediastinal]
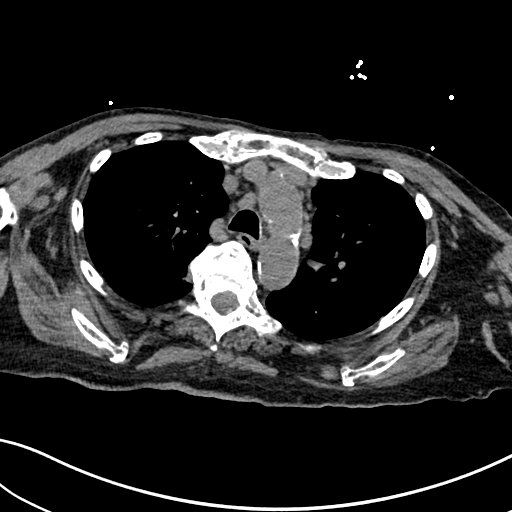
[im 107/157  lung]
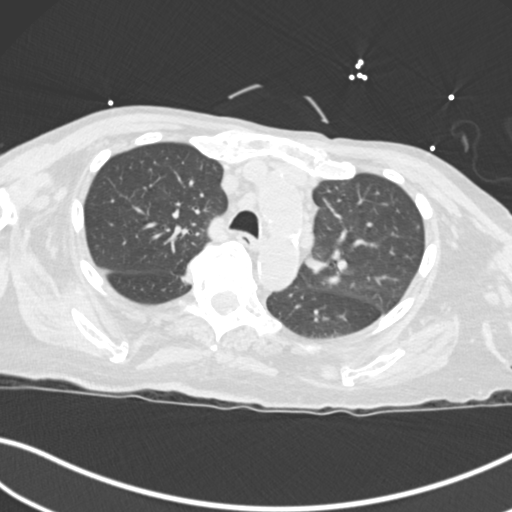
[im 124/157  lung]
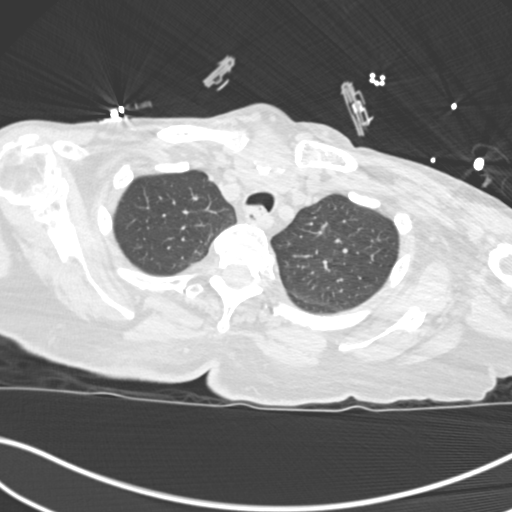
[im 132/157  lung]
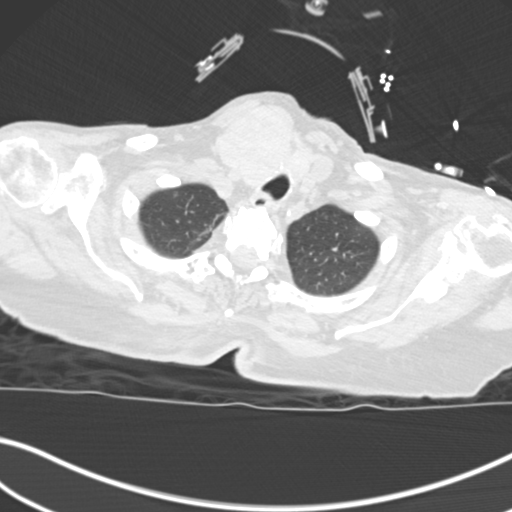
[im 148/157  lung]
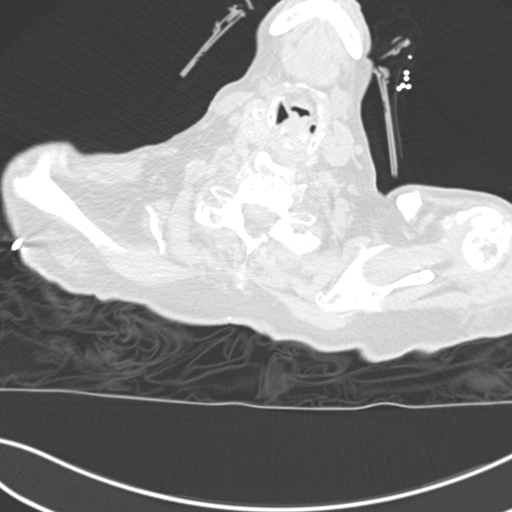

[Series 7: coronal · coronal · 0.61mm/px · 3 of 91 slices shown]
[im 19/91  lung]
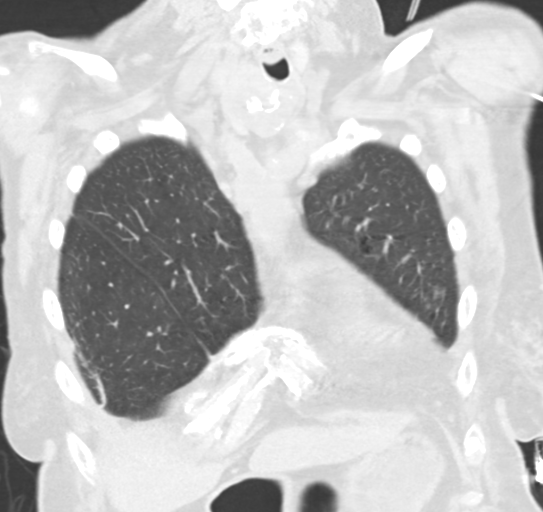
[im 37/91  lung]
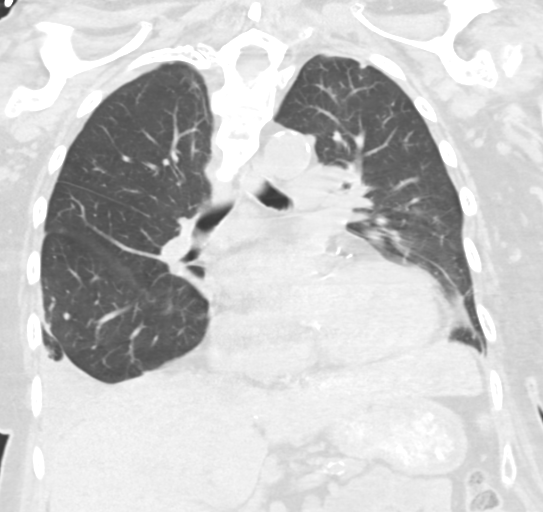
[im 55/91  lung]
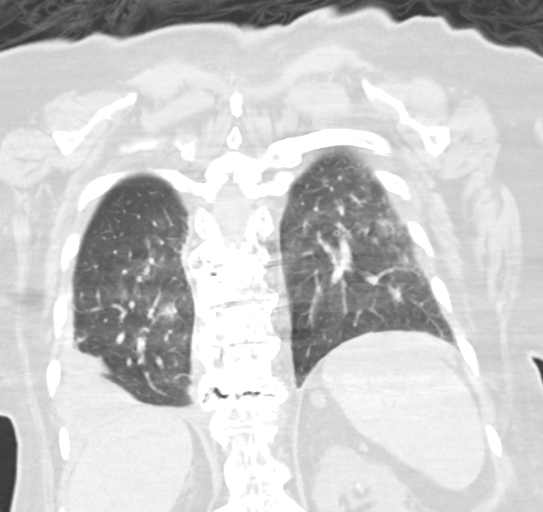

[15 of 36 positions shown; findings below may reference images not displayed]

FINDINGS: Cardiovascular: The heart is borderline enlarged and there is a
trace pericardial effusion. Multi-vessel coronary artery
calcifications are noted. There is atherosclerotic calcification of
the aorta without evidence of aneurysm. The pulmonary trunk is
normal in caliber.

Mediastinum/Nodes: Nonenlarged lymph nodes are present in the
mediastinum. Evaluation of the hila is limited due to lack of IV
contrast. No axillary lymphadenopathy is seen. The right lobe of the
thyroid gland and isthmus are enlarged, heterogeneous, and multiple
rim calcifications are noted. There is mass effect with leftward
shift on the proximal trachea. The esophagus is within normal
limits.

Lungs/Pleura: Scattered ground-glass and parenchymal opacities are
noted in the lungs bilaterally with a basilar predominance. A few of
the lesions have a slightly nodular morphology measuring up to
cm in the right lower lobe. There are small bilateral pleural
effusions, greater on the right than on the left. No pneumothorax is
seen.

Upper Abdomen: Cholecystectomy clips are present in the right upper
quadrant. No acute abnormality.

Musculoskeletal: Degenerative changes are present in the thoracic
spine. Spinal fusion hardware is noted in the mid to lower thoracic
spine. No acute osseous abnormality is seen. An old rib fracture
with bone callus formation is noted at T11 on the right. Stable
lytic lesions are present in the posterior aspect of the T2 and T3
ribs on the right posteriorly.
IMPRESSION: 1. Ground-glass and parenchymal opacities in the lungs bilaterally
with a basilar predominance which may be infectious or inflammatory.
A few of the areas are nodular in morphology and underlying neoplasm
can not be excluded. Short-term follow-up is recommended.
2. Small bilateral pleural effusions, greater on the right than on
the left.
3. Aortic atherosclerosis.
4. Coronary artery calcifications.
5. Remaining findings as described above.

## 2021-06-04 MED ORDER — INSULIN ASPART 100 UNIT/ML IJ SOLN: 0.0000 [IU] | Freq: Three times a day (TID) | INTRAMUSCULAR | Status: DC

## 2021-06-04 MED ORDER — SODIUM ZIRCONIUM CYCLOSILICATE 10 G PO PACK: 10.0000 g | PACK | Freq: Three times a day (TID) | ORAL | Status: DC

## 2021-06-04 MED ORDER — SODIUM BICARBONATE 650 MG PO TABS
1300.0000 mg | ORAL_TABLET | Freq: Two times a day (BID) | ORAL | Status: DC
  Administered 2021-06-04 – 2021-06-05 (×3): 1300 mg via ORAL
  Filled 2021-06-04 (×4): qty 2

## 2021-06-04 MED ORDER — ACETAMINOPHEN 325 MG PO TABS: 650.0000 mg | ORAL_TABLET | Freq: Four times a day (QID) | ORAL | Status: DC | PRN

## 2021-06-04 MED ORDER — HEPARIN SODIUM (PORCINE) 5000 UNIT/ML IJ SOLN
5000.0000 [IU] | Freq: Three times a day (TID) | INTRAMUSCULAR | Status: DC
  Administered 2021-06-04 – 2021-06-07 (×10): 5000 [IU] via SUBCUTANEOUS
  Filled 2021-06-04 (×10): qty 1

## 2021-06-04 MED ORDER — SODIUM ZIRCONIUM CYCLOSILICATE 10 G PO PACK
20.0000 g | PACK | Freq: Once | ORAL | Status: AC
  Administered 2021-06-04: 20 g via ORAL
  Filled 2021-06-04: qty 2

## 2021-06-04 MED ORDER — SODIUM ZIRCONIUM CYCLOSILICATE 10 G PO PACK
10.0000 g | PACK | Freq: Once | ORAL | Status: AC
  Administered 2021-06-04: 10 g via ORAL
  Filled 2021-06-04: qty 1

## 2021-06-04 MED ORDER — SODIUM CHLORIDE 0.9 % IV BOLUS
1000.0000 mL | Freq: Once | INTRAVENOUS | Status: AC
Start: 2021-06-04 — End: 2021-06-04
  Administered 2021-06-04: 1000 mL via INTRAVENOUS

## 2021-06-04 MED ORDER — LORATADINE 10 MG PO TABS
10.0000 mg | ORAL_TABLET | Freq: Every day | ORAL | Status: DC
  Administered 2021-06-04 – 2021-06-07 (×4): 10 mg via ORAL
  Filled 2021-06-04 (×4): qty 1

## 2021-06-04 MED ORDER — SODIUM ZIRCONIUM CYCLOSILICATE 10 G PO PACK
10.0000 g | PACK | Freq: Every day | ORAL | Status: AC
  Administered 2021-06-04: 10 g via ORAL
  Filled 2021-06-04: qty 1

## 2021-06-04 MED ORDER — LACTATED RINGERS IV SOLN: INTRAVENOUS | Status: DC

## 2021-06-04 MED ORDER — ACETAMINOPHEN 650 MG RE SUPP: 650.0000 mg | Freq: Four times a day (QID) | RECTAL | Status: DC | PRN

## 2021-06-04 MED ORDER — SODIUM CHLORIDE 0.9 % IV SOLN: INTRAVENOUS | Status: DC

## 2021-06-04 MED ORDER — CLOPIDOGREL BISULFATE 75 MG PO TABS
75.0000 mg | ORAL_TABLET | Freq: Every day | ORAL | Status: DC
  Administered 2021-06-04 – 2021-06-07 (×4): 75 mg via ORAL
  Filled 2021-06-04 (×4): qty 1

## 2021-06-04 NOTE — H&P (Signed)
?History and Physical  ? ? ?PLEASE NOTE THAT DRAGON DICTATION SOFTWARE WAS USED IN THE CONSTRUCTION OF THIS NOTE. ? ? ?[name]:[id number] DOB: [birth year] DOA: 06/03/2021 ? ?PCP: Bernerd Limbo, MD  ?Patient coming from: home  ? ?I have personally briefly reviewed patient's old medical records in [place] ? ?Chief Complaint: Fall ? ?HPI: Jacqueline Richmond is a 76 y.o. female with medical history significant for stage IV CKD, hypertension, type 2 diabetes mellitus, allergic rhinitis, who is admitted to [facility] on 06/03/2021 with hyperkalemia after presenting from home to [facility] ED complaining of fall.  ? ?The patient reports that earlier this evening, she was attempting to climb into her bed, which is slightly elevated relative to typical bed height, at which time she tripped, falling backwards onto the floor below, striking her back and then the posterior aspect of her head.  She denies any associated loss of consciousness, and only mild residual back discomfort.  No additional significant arthralgias or myalgias.  She reports that this fall was purely mechanical in nature, noting no associated, preceding, or ensuing chest pain, palpitations, diaphoresis, nausea, vomiting, dizziness, presyncope, or syncope.  Denies any ensuing headache neck pain, or neck stiffness.  Within a few seconds of this fall, patient reports that she was able to contact EMS, who arrived within 15 to 20 minutes, and reportedly found the patient to be conscious laying on the floor of her bedroom.  EMS subsequently brought patient to [facility] emergency department for further evaluation management of the above mechanical fall.  The setting of peripheral artery disease, she reports that she is on daily Plavix, but denies concomitant use of aspirin.  No formal anticoagulation. ? ?Per chart review, patient has a history of chronic kidney disease, with most recent prior serum creatinine noted to be 2.20 in November 2022.  The  patient reports that she recently followed up with her PCP, who conveyed to the patient his clinical suspicion that the patient is dehydrated.  Consequently, he discontinued her Lasix and encouraged her to significantly increase her daily volume of water consumption.  She acknowledges that she has been suboptimally compliant and adhering to these recommendations, noting minimal, if any interval increase in her water consumption.  Denies any recent diarrhea, abdominal pain, rash.  Denies any recent dysuria, gross hematuria.  Over the last few days, she has experienced a slight decrease in appetite, resulting in further decline in her oral intake.  Denies any recent subjective fever, chills, rigors, or generalized myalgias. ? ? ? ? ?ED Course:  ?Vital signs in the ED were notable for the following: Afebrile; heart rate 61-72; blood pressure 103/71- 132/84; respiratory rate 17-26, oxygen saturation 98 to 100% on room air. ? ?Labs were notable for the following: CMP notable for the following: Potassium greater than 7.5, bicarbonate 21, anion gap 9, BUN 63, creatinine 3.63, glucose 113 calcium, corrected for mild hypoalbuminemia noted to be 10.5, albumin 3.5, otherwise liver enzymes within normal limits.  CBC notable for white blood cell count 8400, hemoglobin 13.2.  Urinalysis has been ordered, with result currently pending.  COVID-19/influenza PCR results currently pending. ? ?Imaging and additional notable ED work-up: EKG shows sinus rhythm with nonspecific intraventricular conduction delay, heart rate 66, nonspecific T wave inversion in aVL, and no evidence of ST changes, including no evidence of ST elevation.  Chest x-ray shows stable small right pleural effusion and right basilar atelectasis, but otherwise no evidence of acute cardiopulmonary process, including  no evidence of infiltrate, edema, or pneumothorax.  CT head showed no evidence of acute intracranial process, including no evidence of intracranial  hemorrhage, while CT cervical spine showed no evidence of acute fracture or traumatic listhesis of the cervical spine.  Plain films of the pelvis showed no evidence of acute fracture.  CT chest showed a few bibasilar pulmonary nodules, measuring up to 2.6 cm in the right lower lobe, with associated radiology recommendation for short-term follow-up.  ? ?While in the ED, the following were administered: Albuterol nebulizer x1, calcium gluconate 1 g IV x1, NovoLog 5 units IV x1, 1 amp of D50 IV x1, Lokelma 10 g p.o. x1, normal saline x1 L bolus. ? ?EDP discussed patient's case with the on-call nephrologist, Dr. Joylene Grapes , Who agreed with the above medical management, and recommended repeat serum potassium level in about an hour.  Dr.**Also conveyed that he will formally consult, with additional recommendations pending at this time. ? ?Subsequently, the patient was admitted for further evaluation and management of hyperkalemia in the setting of AKI on stage IV CKD with suspected dehydration and associated hypercalcemia after presenting for evaluation of ground-level fall earlier in the day. ? ? ? ? ? ?Review of Systems: As per HPI otherwise 10 point review of systems negative.  ? ?Past Medical History:  ?Diagnosis Date  ? Allergy   ? Anemia   ? Arthritis   ? Cataract   ? biltaerally   ? CKD (chronic kidney disease) stage 3, GFR 30-59 ml/min (HCC) 08/29/2011  ? per patient, kidneys monitored by Dr Posey Pronto endocrinologist ,lov was  05-18-2018, "kidney fx stable"  ? Colon polyp   ? Complication of anesthesia   ? Difficult intubation   ? small mouth opening  ? Diverticulosis   ? Gout   ? HTN (hypertension) 08/29/2011  ? Hyperlipemia   ? Psoriasis   ? Type II or unspecified type diabetes mellitus without mention of complication, not stated as uncontrolled 08/29/2011  ? ? ?Past Surgical History:  ?Procedure Laterality Date  ? ABDOMINAL AORTOGRAM W/LOWER EXTREMITY N/A 02/12/2021  ? Procedure: ABDOMINAL AORTOGRAM W/LOWER EXTREMITY;   Surgeon: Serafina Mitchell, MD;  Location: [place] [facility];  Service: Cardiovascular;  Laterality: N/A;  ? APPLICATION OF WOUND VAC N/A 08/30/2020  ? Procedure: APPLICATION OF WOUND VAC;  Surgeon: Consuella Lose, MD;  Location: [place];  Service: Neurosurgery;  Laterality: N/A;  ? BREAST LUMPECTOMY WITH NEEDLE LOCALIZATION Right 05/07/2012  ? Procedure: BREAST LUMPECTOMY WITH NEEDLE LOCALIZATION;  Surgeon: Merrie Roof, MD;  Location: [place];  Service: General;  Laterality: Right;  ? CHOLECYSTECTOMY    ? COLONOSCOPY  2009  ? Ardis Hughs   ? COLONOSCOPY WITH PROPOFOL N/A 05/20/2018  ? Procedure: COLONOSCOPY WITH PROPOFOL;  Surgeon: Milus Banister, MD;  Location: [facility];  Service: Endoscopy;  Laterality: N/A;  ? HERNIA REPAIR    ? HIP SURGERY    ? LAMINECTOMY WITH POSTERIOR LATERAL ARTHRODESIS LEVEL 4 N/A 08/01/2020  ? Procedure: LAMINECTOMY WITH POSTERIOR LATERAL ARTHRODESIS Thoracic nine- Thoracic eleven ;  Surgeon: Consuella Lose, MD;  Location: [place];  Service: Neurosurgery;  Laterality: N/A;  ? PERIPHERAL VASCULAR INTERVENTION Right 02/12/2021  ? Procedure: PERIPHERAL VASCULAR INTERVENTION;  Surgeon: Serafina Mitchell, MD;  Location: [place] [facility];  Service: Cardiovascular;  Laterality: Right;  ? POLYPECTOMY  05/20/2018  ? Procedure: POLYPECTOMY;  Surgeon: Milus Banister, MD;  Location: [facility];  Service: Endoscopy;;  ? RHINOPLASTY    ?  SALPINGOOPHORECTOMY    ? TOTAL HIP ARTHROPLASTY Right 2004  ? WOUND EXPLORATION N/A 08/30/2020  ? Procedure: SUPERFICIALWOUND EXPLORATION, APPLY WOUND VAC;  Surgeon: Consuella Lose, MD;  Location: [place];  Service: Neurosurgery;  Laterality: N/A;  ? ? ?Social History: ? reports that she has never smoked. She has never used smokeless tobacco. She reports current alcohol use. She reports that she does not use drugs. ? ? ?Allergies  ?Allergen Reactions  ? Codeine Itching  ? Iodinated Contrast Media   ?  Other reaction(s): Unknown  ? Pentazocine Other (See Comments)   ?  Hallucinations  ? Statins   ? ? ?Family History  ?Problem Relation Age of Onset  ? Heart disease Father   ? Arthritis Sister   ? Colon cancer Neg Hx   ? Colon polyps Neg Hx   ? Esophageal cancer Neg Hx   ? [place]

## 2021-06-04 NOTE — Progress Notes (Signed)
PT Cancellation Note ? ?Patient Details ?Name: Jacqueline Richmond ?MRN: [id number] ?DOB: September 07, 1945 ? ? ?Cancelled Treatment:    Reason Eval/Treat Not Completed: Patient at procedure or test/unavailable Pt being transferred off the floor at this time. Will follow up as schedule allows.  ? ?Reuel Derby, PT, DPT  ?Acute Rehabilitation Services  ?Pager: [phone] ?Office: [phone] ? ?[name] ?06/04/2021, 3:05 PM ?

## 2021-06-04 NOTE — Progress Notes (Addendum)
[facility] Progress Note   Subjective:   seen eating lunch - feeling ok.  Says urinating a lot and feeling ok.  Husband is bedside.  Renal US showed 1L of urine in bladder, no hydronephrosis.    Objective Vitals:   06/04/21 1200 06/04/21 1312 06/04/21 1400 06/04/21 1455  BP: 134/69  125/64   Pulse: 69  67 76  Resp: 15  13 16   Temp:  97.8 F (36.6 C)    TempSrc:  Oral    SpO2: 100%  99% 100%  Weight:      Height:       Physical Exam General: eating full lunch, awake and comfortable Heart: RRR Lungs: clear ant Abdomen: soft, bladder nontender Extremities:  no edema  Additional Objective Labs: Basic Metabolic Panel: Recent Labs  Lab 06/04/21 0307 06/04/21 0720 06/04/21 1014 06/04/21 1157 06/04/21 1226  NA 140 138 139  --  141  K 6.7* 6.6* 5.9* 5.9* 5.9*  CL 114* 111 111  --   --   CO2 19* 18* 21*  --   --   GLUCOSE 74 76 124*  --   --   BUN 60* 61* 57*  --   --   CREATININE 3.24* 3.44* 3.45*  --   --   CALCIUM 9.1 9.4 9.5  --   --   PHOS  --  4.9*  --   --   --    Liver Function Tests: Recent Labs  Lab 06/03/21 2301 06/04/21 0720  AST 17 15  ALT 15 12  ALKPHOS 179* 124  BILITOT 0.3 0.5  PROT 9.0* 7.0  ALBUMIN 3.5 2.7*   No results for input(s): LIPASE, AMYLASE in the last 168 hours. CBC: Recent Labs  Lab 06/03/21 2300 06/04/21 0720 06/04/21 1226  WBC 8.4 8.3  --   NEUTROABS 6.8 5.8  --   HGB 13.2 10.7* 11.6*  HCT 43.4 35.3* 34.0*  MCV 82.8 82.1  --   PLT 183 154  --    Blood Culture    Component Value Date/Time   SDES URINE, CLEAN CATCH 12/21/2020 1157   SPECREQUEST NONE 12/21/2020 1157   CULT  12/21/2020 1157    NO GROWTH Performed at [facility], [address] [place], [place] [address]    REPTSTATUS 12/22/2020 FINAL 12/21/2020 1157    Cardiac Enzymes: Recent Labs  Lab 06/04/21 0720  CKTOTAL 30*   CBG: Recent Labs  Lab 06/04/21 0722 06/04/21 1131  GLUCAP 76 91   Iron Studies: No results for  input(s): IRON, TIBC, TRANSFERRIN, FERRITIN in the last 72 hours. @lablastinr3 @ Studies/Results: CT Head Wo Contrast  Result Date: 06/03/2021 CLINICAL DATA:  Fall EXAM: CT HEAD WITHOUT CONTRAST CT CERVICAL SPINE WITHOUT CONTRAST TECHNIQUE: Multidetector CT imaging of the head and cervical spine was performed following the standard protocol without intravenous contrast. Multiplanar CT image reconstructions of the cervical spine were also generated. RADIATION DOSE REDUCTION: This exam was performed according to the departmental dose-optimization program which includes automated exposure control, adjustment of the mA and/or kV according to patient size and/or use of iterative reconstruction technique. COMPARISON:  CT chest 07/31/2020. FINDINGS: CT HEAD FINDINGS Brain: No evidence of large-territorial acute infarction. No parenchymal hemorrhage. No mass lesion. No extra-axial collection. No mass effect or midline shift. No hydrocephalus. Basilar cisterns are patent. Vascular: No hyperdense vessel. Skull: No acute fracture or focal lesion. Sinuses/Orbits: Paranasal sinuses and mastoid air cells are clear. The orbits are unremarkable. Other: Mild  left parietal scalp edema with no large hematoma formation (4:24). Partially visualized vertex scalp soft tissue density with calcification (6:40). CT CERVICAL SPINE FINDINGS Alignment: Straightening of the normal cervical lordosis likely due to positioning and degenerative changes. Skull base and vertebrae: Diffusely decreased bone density. Multilevel severe degenerative changes and fusion of the spine. Multilevel severe osseous neural foraminal stenosis along the left C2-C3, C3-C4, C4-C5 levels. No severe osseous central canal stenosis. no acute fracture. No aggressive appearing focal osseous lesion or focal pathologic process. Soft tissues and spinal canal: No prevertebral fluid or swelling. No visible canal hematoma. Upper chest: Right apex ground-glass pulmonary  micronodule. Other: Persistent (from 07/31/2020) indeterminate lytic appearing lesion of the posterior right second and third ribs (6:50, 56). IMPRESSION: 1. No acute intracranial abnormality. 2. No acute displaced fracture or traumatic listhesis of the cervical spine. 3. Multilevel severe osseous neural foraminal stenosis along the left C2-C3, C3-C4, C4-C5 levels. No severe osseous central canal stenosis. 4. Persistent indeterminate lytic appearing lesion of the posterior right second and third ribs. 5. artially visualized vertex scalp soft tissue density with calcification. Correlate with physical exam. Electronically Signed   By: Tish Frederickson M.D.   On: 06/03/2021 23:55   CT Chest Wo Contrast  Result Date: 06/04/2021 CLINICAL DATA:  Lung nodule, high cancer risk.  Fall. EXAM: CT CHEST WITHOUT CONTRAST TECHNIQUE: Multidetector CT imaging of the chest was performed following the standard protocol without IV contrast. RADIATION DOSE REDUCTION: This exam was performed according to the departmental dose-optimization program which includes automated exposure control, adjustment of the mA and/or kV according to patient size and/or use of iterative reconstruction technique. COMPARISON:  06/03/2021, 07/31/2020. FINDINGS: Cardiovascular: The heart is borderline enlarged and there is a trace pericardial effusion. Multi-vessel coronary artery calcifications are noted. There is atherosclerotic calcification of the aorta without evidence of aneurysm. The pulmonary trunk is normal in caliber. Mediastinum/Nodes: Nonenlarged lymph nodes are present in the mediastinum. Evaluation of the hila is limited due to lack of IV contrast. No axillary lymphadenopathy is seen. The right lobe of the thyroid gland and isthmus are enlarged, heterogeneous, and multiple rim calcifications are noted. There is mass effect with leftward shift on the proximal trachea. The esophagus is within normal limits. Lungs/Pleura: Scattered ground-glass  and parenchymal opacities are noted in the lungs bilaterally with a basilar predominance. A few of the lesions have a slightly nodular morphology measuring up to 2.6 cm in the right lower lobe. There are small bilateral pleural effusions, greater on the right than on the left. No pneumothorax is seen. Upper Abdomen: Cholecystectomy clips are present in the right upper quadrant. No acute abnormality. Musculoskeletal: Degenerative changes are present in the thoracic spine. Spinal fusion hardware is noted in the mid to lower thoracic spine. No acute osseous abnormality is seen. An old rib fracture with bone callus formation is noted at T11 on the right. Stable lytic lesions are present in the posterior aspect of the T2 and T3 ribs on the right posteriorly. IMPRESSION: 1. Ground-glass and parenchymal opacities in the lungs bilaterally with a basilar predominance which may be infectious or inflammatory. A few of the areas are nodular in morphology and underlying neoplasm can not be excluded. Short-term follow-up is recommended. 2. Small bilateral pleural effusions, greater on the right than on the left. 3. Aortic atherosclerosis. 4. Coronary artery calcifications. 5. Remaining findings as described above. Electronically Signed   By: Thornell Sartorius M.D.   On: 06/04/2021 01:14   CT Cervical Spine  Wo Contrast  Result Date: 06/03/2021 CLINICAL DATA:  Fall EXAM: CT HEAD WITHOUT CONTRAST CT CERVICAL SPINE WITHOUT CONTRAST TECHNIQUE: Multidetector CT imaging of the head and cervical spine was performed following the standard protocol without intravenous contrast. Multiplanar CT image reconstructions of the cervical spine were also generated. RADIATION DOSE REDUCTION: This exam was performed according to the departmental dose-optimization program which includes automated exposure control, adjustment of the mA and/or kV according to patient size and/or use of iterative reconstruction technique. COMPARISON:  CT chest  07/31/2020. FINDINGS: CT HEAD FINDINGS Brain: No evidence of large-territorial acute infarction. No parenchymal hemorrhage. No mass lesion. No extra-axial collection. No mass effect or midline shift. No hydrocephalus. Basilar cisterns are patent. Vascular: No hyperdense vessel. Skull: No acute fracture or focal lesion. Sinuses/Orbits: Paranasal sinuses and mastoid air cells are clear. The orbits are unremarkable. Other: Mild left parietal scalp edema with no large hematoma formation (4:24). Partially visualized vertex scalp soft tissue density with calcification (6:40). CT CERVICAL SPINE FINDINGS Alignment: Straightening of the normal cervical lordosis likely due to positioning and degenerative changes. Skull base and vertebrae: Diffusely decreased bone density. Multilevel severe degenerative changes and fusion of the spine. Multilevel severe osseous neural foraminal stenosis along the left C2-C3, C3-C4, C4-C5 levels. No severe osseous central canal stenosis. no acute fracture. No aggressive appearing focal osseous lesion or focal pathologic process. Soft tissues and spinal canal: No prevertebral fluid or swelling. No visible canal hematoma. Upper chest: Right apex ground-glass pulmonary micronodule. Other: Persistent (from 07/31/2020) indeterminate lytic appearing lesion of the posterior right second and third ribs (6:50, 56). IMPRESSION: 1. No acute intracranial abnormality. 2. No acute displaced fracture or traumatic listhesis of the cervical spine. 3. Multilevel severe osseous neural foraminal stenosis along the left C2-C3, C3-C4, C4-C5 levels. No severe osseous central canal stenosis. 4. Persistent indeterminate lytic appearing lesion of the posterior right second and third ribs. 5. artially visualized vertex scalp soft tissue density with calcification. Correlate with physical exam. Electronically Signed   By: Tish Frederickson M.D.   On: 06/03/2021 23:55   CT Thoracic Spine Wo Contrast  Result Date:  06/04/2021 CLINICAL DATA:  Back trauma, no prior imaging (Age >= 16y).  Fall EXAM: CT THORACIC SPINE WITHOUT CONTRAST TECHNIQUE: Multidetector CT images of the thoracic were obtained using the standard protocol without intravenous contrast. RADIATION DOSE REDUCTION: This exam was performed according to the departmental dose-optimization program which includes automated exposure control, adjustment of the mA and/or kV according to patient size and/or use of iterative reconstruction technique. COMPARISON:  CT chest 07/31/2020, ultrasound thyroid 08/01/2020 FINDINGS: Alignment: Slight dextroscoliosis of the mid to upper thoracic spine. Vertebrae: Diffusely decreased bone density. Multilevel severe degenerative changes of the spine with bridging osteophyte formation. Redemonstration of T8 through T11 posterolateral fusion surgical hardware with no CT findings of surgical hardware complication. Redemonstration of old T10-T11 fracture. No acute fracture or focal pathologic process. Paraspinal and other soft tissues: Negative. Disc levels: Disc space vacuum phenomenon at the T10-T11 levels. Other: Right lower lobe spiculated bilobed pulmonary nodule measuring 2.8 x 1.6 cm. Adjacent 1.8 x 1.1 cm pulmonary nodule. Trace right pleural effusion. Redemonstration of posterior right second and third rib lytic lesions (4:11, 18). Old healed right posterior eleventh rib fracture. Redemonstration of an enlarged and heterogeneous right thyroid gland and isthmus gland. Associated curvilinear calcifications are again noted. Atherosclerotic plaque.  Four-vessel coronary artery calcifications. IMPRESSION: 1. No acute displaced fracture or traumatic listhesis of the thoracic spine in a patient with  diffusely decreased bone density, multilevel severe degenerative changes of the spine, and T8 - T11 posterolateral surgical hardware fixation of prior unstable T10-T11 fracture. 2. Right lower lobe spiculated bilobed pulmonary nodule measuring  2.8 x 1.6 cm. Adjacent 1.8 x 1.1 cm pulmonary nodule. Recommend prompt non-contrast Chest CT for further evaluation.These guidelines do not apply to immunocompromised patients and patients with cancer. Follow up in patients with significant comorbidities as clinically warranted. For lung cancer screening, adhere to Lung-RADS guidelines. Reference: Radiology. 2017; 284(1):228-43. 3. Trace right pleural effusion. 4. Persistent indeterminate posterior right second and third rib lytic lesions. 5. Enlarged and heterogeneous right thyroid gland and isthmus gland. Associated curvilinear calcifications are again noted. This has been evaluated on previous imaging. (ref: J Am Coll Radiol. 2015 Feb;12(2): 143-50). 6. Aortic Atherosclerosis (ICD10-I70.0). Including four-vessel coronary artery calcifications. Electronically Signed   By: Tish Frederickson M.D.   On: 06/04/2021 00:03   US RENAL  Result Date: 06/04/2021 CLINICAL DATA:  Acute kidney injury. EXAM: RENAL / URINARY TRACT ULTRASOUND COMPLETE COMPARISON:  CT abdomen 08/01/2020 and chest CT 06/04/2021 FINDINGS: Right Kidney: Renal measurements: 11.2 x 4.9 x 5.1 cm = volume: 147 mL. Normal echogenicity. Negative for hydronephrosis. Exophytic anechoic cyst in the right kidney upper pole that measures up to 2.4 cm. Left Kidney: Not visualized and obscured by bowel gas. Limited examination because patient was unable to be moved into a right lateral decubitus position. Bladder: Distended with fluid.  Calculated bladder volume is 1085 mL. Other: None. IMPRESSION: 1. Limited examination because the left kidney could not be visualized at all. 2. Right kidney has a normal appearance without hydronephrosis. Simple right renal cyst. 3. Urinary bladder is markedly distended. Electronically Signed   By: Richarda Overlie M.D.   On: 06/04/2021 08:50   DG Pelvis Portable  Result Date: 06/03/2021 CLINICAL DATA:  Larey Seat EXAM: PORTABLE PELVIS 1-2 VIEWS COMPARISON:  08/01/2020 FINDINGS:  Frontal view of the pelvis as well as a frontal view of the proximal right femur was performed. Right hip arthroplasty is in the expected position without signs of acute complication. There are no acute displaced pelvic fractures. Stable left hip osteoarthritis. Extensive lower lumbar spondylosis and facet hypertrophy. Remainder of the bony pelvis is unremarkable. There is diffuse atherosclerosis. IMPRESSION: 1. No acute displaced fracture. 2. Stable left hip osteoarthritis. 3. Unremarkable right hip arthroplasty. Electronically Signed   By: Sharlet Salina M.D.   On: 06/03/2021 23:59   DG Chest Portable 1 View  Result Date: 06/03/2021 CLINICAL DATA:  Larey Seat EXAM: PORTABLE CHEST 1 VIEW COMPARISON:  12/18/2020 FINDINGS: Single frontal view of the chest demonstrates a stable cardiac silhouette. Atherosclerosis of the aortic arch unchanged. Stable small right pleural effusion. Underlying right basilar consolidation likely atelectasis. No new airspace disease. No pneumothorax. No acute bony abnormalities. Stable postsurgical changes from lower thoracic fusion. IMPRESSION: 1. Stable small right pleural effusion and right basilar atelectasis. 2. Otherwise no acute intrathoracic process. Electronically Signed   By: Sharlet Salina M.D.   On: 06/03/2021 23:57   Medications:  sodium chloride 100 mL/hr at 06/04/21 1315    clopidogrel  75 mg Oral Daily   heparin injection (subcutaneous)  5,000 Units Subcutaneous Q8H   insulin aspart  0-6 Units Subcutaneous TID WC   loratadine  10 mg Oral Daily   sodium bicarbonate  1,300 mg Oral BID   sodium zirconium cyclosilicate  10 g Oral Once    Assessment/Plan: **Hyperkalemia:  K was 7.6 on presentation but improving with medical  management.    Secondary to AKI + possible KCl ingestion.  Check CK.  Will redose lokelma now.  Continue to trend.  At this point I don't think dialysis will end up being needed, but will follow closely.  Next K check 6pm.   **AKI on CKD 4,  nonoliguric:  recent Cr in the 2.0mg /dL range, was 4 on presentation 3/13 in the setting of diarrhea with suspected prerenal insult +/- hypotension with tubular injury. UA 3/13 not consistent with GN.  Renal US showed no hydro but distended bladder -- making lots of urine via purewick but ? Overflow --> check bladder scan now and insert foley if needed.  Thankfully improving with creatinine down to 3.2.  Continue isotonic fluid for now.  Avoid nephrotoxins.  Avoid hypotension, maintain euvolemia.   **Metabolic acidosis: mild, getting oral bicarb.   **HTN:  BPs low, holding home meds  **Diarrhea:  sample has been collected for eval; per primary  Will follow - call with conerns.   Estill Bakes MD 06/04/2021, 3:23 PM  [facility] Pager: [phone]

## 2021-06-04 NOTE — Progress Notes (Signed)
?   06/03/21 2220  ?Clinical Encounter Type  ?Visited With Other (Comment)  ?Visit Type Trauma  ?Referral From Nurse  ?Consult/Referral To Chaplain  ? ? ?Chaplain Jorene Guest responded to page. Spoke with Financial controller, chaplain support is not needed, advise chaplain remains available for supThis note was prepared by Jeanine Luz, M.Div..  For questions please contact by phone [phone].  port as needed.  ?

## 2021-06-04 NOTE — Plan of Care (Signed)

## 2021-06-04 NOTE — Progress Notes (Signed)
HOSPITAL MEDICINE OVERNIGHT EVENT NOTE   ? ?Notified by nursing that potassium is 6.7 which seems to be downtrending. ? ?Chart reviewed, Dr. Zenda Alpers with nephrology has been consulted and has apparently ordered repeat dose of Lokelma.  Their assistance is appreciated. ? ?Continuing to monitor patient on telemetry with serial chemistries. ? ?Vernelle Emerald  MD ?[facility]  ? ? ? ? ? ? ? ? ? ? ?

## 2021-06-04 NOTE — ED Notes (Signed)
Provider at bedside

## 2021-06-04 NOTE — Consult Note (Signed)
Nephrology Consult   Requesting provider: Huey Bienenstock Service requesting consult: Hospitalist Reason for consult: Hyperkalemia/AKI   Assessment/Recommendations: Jacqueline Richmond is a/an 76 y.o. female with a past medical history CKD IV, HTN, DM2 who present w/ fall found to have AKI and hyperkalemia   Non-Oliguric AKI on CKD 4: Likely baseline creatinine around 2.  AKI with creatinine up to 4 now improved down to 3.2.  AKI likely in the setting of dehydration with diarrhea and possible intermittent hypotension -Normal saline 100 cc/h -Continue to monitor daily Cr, Dose meds for GFR -Monitor Daily I/Os, Daily weight  -Maintain MAP>65 for optimal renal perfusion.  -Avoid nephrotoxic medications including NSAIDs -Use synthetic opioids (Fentanyl/Dilaudid) if needed -Check Renal U/S to rule out obstruction -Currently no indication for HD; further details under hyperkalemia -Follow-up urinalysis  Hyperkalemia: Significant hyperkalemia with potassium up to 7.6.  It is odd for this to occur as it has not been a significant problem for her in the past.  She said that she got some potassium shots at rehab and I wonder if they discharged her on oral potassium and she did not realize it.  Not on any culprit medications.  Type IV RTA is possible but less likely given mild acidosis.  Given her age, improving potassium, improving creatinine, no significant EKG changes I think the best option is to continue with medical management of hyperkalemia -Repeat potassium this morning -Lokelma 20 g now -Repeat Lokelma as needed -Hydration as above -Acidosis management as below  Metabolic acidosis: Mild with bicarb of 19 but given her hyperkalemia will treat with sodium bicarb 1300 mg twice daily.  Hypertension: We will hold home medications for now including atenolol and clonidine  Type 2 diabetes: Management per primary   Recommendations conveyed to primary service.    Darnell Level [facility] 06/04/2021 6:55 AM   _____________________________________________________________________________________ CC: fall  History of Present Illness: Jacqueline Richmond is a/an 76 y.o. female with a past medical history of CKD IV, DM2, HTN who presents with fall.  Patient presented to the hospital yesterday after having a fall at home.  The patient states that she was trying to get into bed at which time she felt like her legs gave out and she fell and hit the back of her head as well as scraped her legs.  She admits to some tripping component but also states that she felt like she was going to pass out.  She has had several falls over the past few months.  She has required physical therapy and rehab.  However, it is notable that she has told other providers that she has not had any syncope or presyncope and that most of her falls are mechanical in nature.  Patient does not take any NSAIDs.  She specifically denies the use of an ARB or spironolactone.  She denies fevers, chills, shortness of breath, chest pain.  She has had some diarrhea yesterday which happens every now and then.  There was no blood in her diarrhea.  Denies any dysuria or hematuria.  Patient called EMS who came to help the patient get up.  In the emergency department vital signs were overall reassuring but labs demonstrated creatinine of 4 as well as a potassium of 7.6.  Patient was given several shifting agents, fluids, Lokelma.  EKG was without major concern.  Chest x-ray was overall reassuring but CT chest did show a few bibasilar pulmonary nodules.  Of note, the patient follows with Dr.  Patel for CKD management.  Creatinine was last 2.2 in November 2022.  I do not see a lot of problems with hyperkalemia in the past.  The patient states she is not taking potassium by mouth but states that she was given some "shots of potassium" last week by rehab.   Medications:  Current Facility-Administered Medications  Medication  Dose Route Frequency Provider Last Rate Last Admin   0.9 %  sodium chloride infusion   Intravenous Continuous Darnell Level, MD       acetaminophen (TYLENOL) tablet 650 mg  650 mg Oral Q6H PRN Howerter, Justin B, DO       Or   acetaminophen (TYLENOL) suppository 650 mg  650 mg Rectal Q6H PRN Howerter, Justin B, DO       clopidogrel (PLAVIX) tablet 75 mg  75 mg Oral Daily Howerter, Justin B, DO       insulin aspart (novoLOG) injection 0-6 Units  0-6 Units Subcutaneous TID WC Howerter, Justin B, DO       loratadine (CLARITIN) tablet 10 mg  10 mg Oral Daily Howerter, Justin B, DO       sodium bicarbonate tablet 1,300 mg  1,300 mg Oral BID Darnell Level, MD       sodium zirconium cyclosilicate (LOKELMA) packet 20 g  20 g Oral Once Darnell Level, MD       Current Outpatient Medications  Medication Sig Dispense Refill   allopurinol (ZYLOPRIM) 100 MG tablet Take 50 mg by mouth every other day.     aspirin EC 81 MG tablet Take 81 mg by mouth daily. Swallow whole.     atenolol (TENORMIN) 50 MG tablet Take 1 tablet (50 mg total) by mouth 2 (two) times daily. 180 tablet 3   cloNIDine (CATAPRES) 0.2 MG tablet Take 0.2 mg by mouth 2 (two) times daily.     clopidogrel (PLAVIX) 75 MG tablet Take 1 tablet (75 mg total) by mouth daily. 30 tablet 11   furosemide (LASIX) 40 MG tablet Take 40 mg by mouth daily.     gabapentin (NEURONTIN) 100 MG capsule Take 100 mg by mouth at bedtime.     gabapentin (NEURONTIN) 300 MG capsule Take 300 mg by mouth at bedtime.     insulin lispro (HUMALOG) 100 UNIT/ML KwikPen Inject 0-14 Units into the skin 2 (two) times daily before a meal. Blood sugar 0-200 = no coverage; 201-250 = 2 units; 251-300 = 4 units; 301-350 = 6 units; 351-400 = 8 units; 401-450 = 10 units; 451-500 = 12 units; >500 = 14 units and call MD. (Patient not taking: Reported on 03/04/2021)     loratadine (CLARITIN) 10 MG tablet Take 10 mg by mouth daily.     sitaGLIPtin (JANUVIA) 50 MG tablet Take 50  mg by mouth daily.       ALLERGIES Codeine, Iodinated contrast media, Pentazocine, and Statins  MEDICAL HISTORY Past Medical History:  Diagnosis Date   Allergy    Anemia    Arthritis    Cataract    biltaerally    CKD (chronic kidney disease) stage 3, GFR 30-59 ml/min (HCC) 08/29/2011   per patient, kidneys monitored by Dr Allena Katz endocrinologist ,lov was  05-18-2018, "kidney fx stable"   Colon polyp    Complication of anesthesia    Difficult intubation    small mouth opening   Diverticulosis    Gout    HTN (hypertension) 08/29/2011   Hyperlipemia    Psoriasis  Type II or unspecified type diabetes mellitus without mention of complication, not stated as uncontrolled 08/29/2011     SOCIAL HISTORY Social History   Socioeconomic History   Marital status: Married    Spouse name: Not on file   Number of children: Not on file   Years of education: Not on file   Highest education level: Not on file  Occupational History   Not on file  Tobacco Use   Smoking status: Never   Smokeless tobacco: Never  Vaping Use   Vaping Use: Never used  Substance and Sexual Activity   Alcohol use: Yes    Comment: Drinks on Special Occasions   Drug use: No   Sexual activity: Never  Other Topics Concern   Not on file  Social History Narrative   Not on file   Social Determinants of Health   Financial Resource Strain: Not on file  Food Insecurity: Not on file  Transportation Needs: Not on file  Physical Activity: Not on file  Stress: Not on file  Social Connections: Not on file  Intimate Partner Violence: Not on file     FAMILY HISTORY Family History  Problem Relation Age of Onset   Heart disease Father    Arthritis Sister    Colon cancer Neg Hx    Colon polyps Neg Hx    Esophageal cancer Neg Hx    Stomach cancer Neg Hx    Rectal cancer Neg Hx       Review of Systems: 12 systems reviewed Otherwise as per HPI, all other systems reviewed and negative  Physical Exam: Vitals:    06/04/21 0615 06/04/21 0645  BP: (!) 112/59 128/63  Pulse: 68 69  Resp: 19 16  Temp:    SpO2: 98% 99%   Total I/O In: 1000 [IV Piggyback:1000] Out: -   Intake/Output Summary (Last 24 hours) at 06/04/2021 0655 Last data filed at 06/04/2021 1610 Gross per 24 hour  Intake 1000 ml  Output --  Net 1000 ml   General: well-appearing, no acute distress HEENT: Dry mucous membranes, anicteric sclera, oropharynx clear without lesions CV: regular rate, normal rhythm, no murmurs, no gallops, no rubs, peripheral edema in the bilateral lower extremities at the ankle Lungs: clear to auscultation bilaterally, normal work of breathing Abd: soft, non-tender, non-distended Skin: Scattered scratches with resolved bleeding on the bilateral legs and arms, some venous stasis dermatitis on the bilateral shins, otherwise no visible lesions or rashes Psych: alert, engaged, appropriate mood and affect Musculoskeletal: no obvious deformities Neuro: normal speech, no gross focal deficits   Test Results Reviewed Lab Results  Component Value Date   NA 140 06/04/2021   K 6.7 (HH) 06/04/2021   CL 114 (H) 06/04/2021   CO2 19 (L) 06/04/2021   BUN 60 (H) 06/04/2021   CREATININE 3.24 (H) 06/04/2021   GFR 30.70 (L) 12/03/2020   GLU 147 10/17/2020   CALCIUM 9.1 06/04/2021   ALBUMIN 3.5 06/03/2021   PHOS 3.5 01/03/2021    CBC Recent Labs  Lab 06/03/21 2255 06/03/21 2300  WBC  --  8.4  NEUTROABS  --  6.8  HGB 15.0 13.2  HCT 44.0 43.4  MCV  --  82.8  PLT  --  183    I have reviewed all relevant outside healthcare records related to the patient's current hospitalization

## 2021-06-04 NOTE — Assessment & Plan Note (Signed)
#)   Bibasilar pulmonary nodules: Presenting CT chest showed a few bibasilar pulmonary nodules, measuring up to 2.6 cm in the right lower lobe, with corresponding radiology recommendation for short term follow-up.  Of note, the patient conveys that she is a lifelong non-smoker. ?  ?Plan: Short-term follow-up recommended, per radiology, as above. ?  ?

## 2021-06-04 NOTE — ED Notes (Signed)
Bladder scan = 999 mL.

## 2021-06-04 NOTE — Assessment & Plan Note (Signed)
#)   Hyperkalemia: presenting serum potassium level of greater than 7.5 without corresponding evidence of EKG changes.  Appears to be multifactorial in nature, with contribution from AKI on CKD 4, along with independent contribution from clinical suspicion of dehydration, particularly given patient's report of recent decline in oral intake, as above.  Given presenting mechanical fall, will also check CPK level to evaluate for any additional contribution from rhabdo related metabolic acidosis induced extracellular potassium shift.  Does not appear to be on the potassium supplementation as an outpatient. ?  ?Status post albuterol nebulizer, calcium gluconate 1 g, IV insulin, amp of D50, Lokelma 10 g p.o. x1, as well as a 1 L normal saline bolus.  Dr. Shawna Clamp has been consulted, agree with the above medical management, and recommending repeat serum potassium level, with plans to formally consult and additional recommendations pending at this time.  He did not feel that urgent hemodialysis was warranted at this juncture. ?  ?  ?Plan: monitor on telemetry. Add-on serum Mg level. Check phosphorus.  Follow for results of repeat BMP.  CMP in the morning.  I also ordered a repeat BMP to be checked 4 hours after aforementioned CMP for close monitoring of ensuing serum potassium trend.  Nephrology consulted, as above.  Given suspected contributory AKI on CKD 4, will pursue gentle lactated Ringer's at 75 cc/h.  Further evaluation of acute kidney injury itself, including urinalysis.  Check CPK level.  Check VBG in the morning. ?  ?

## 2021-06-04 NOTE — Progress Notes (Signed)
?  Transition of Care (TOC) Screening Note ? ? ?Patient Details  ?Name: Jacqueline Richmond ?Date of Birth: [birth year] ? ? ?Transition of Care (TOC) CM/SW Contact:    ?Cyndi Bender, RN ?Phone Number: ?06/04/2021, 4:15 PM ? ? ? ?Transition of Care Department [facility]) has reviewed patient and no TOC needs have been identified at this time. We will continue to monitor patient advancement through interdisciplinary progression rounds. If new patient transition needs arise, please place a TOC consult. ? ? ?

## 2021-06-04 NOTE — Consult Note (Addendum)
WOC Nurse Consult Note: ?Reason for Consult: Consult requested for bilat legs.  Performed remotely after review of progress notes and photos in the EMR.  ?Wound type: Scattered areas of partial thickness stasis ulcers and skin loss to bilat calves and feet, dark red and moist. Patchy areas of dry peeling skin. ?Dressing procedure/placement/frequency: Topical treatment orders provided for bedside nurses to perform as follows to promote drying and healing: Apply xeroform gauze to bilat legs Q day, then cover with ABD pads and kerlex, wrapping in a spiral fashion from behind toes to below knees. ?Please re-consult if further assistance is needed.  Thank-you,  ?Julien Girt MSN, RN, [place], [place], CNS ?[phone]  ?  ?

## 2021-06-04 NOTE — Assessment & Plan Note (Signed)
#)   Allergic Rhinitis: documented h/o such, on scheduled Claritin as an outpatient, but in the absence of scheduled intranasal Flonase .  ?  ?  ?Plan: cont home H2 blocker.  ?  ?

## 2021-06-04 NOTE — Progress Notes (Signed)
?PROGRESS NOTE ? ? ? ?Jacqueline Richmond  NVB:[id number] DOB: Jan 29, 1946 DOA: 06/03/2021 ?PCP: Bernerd Limbo, MD  ? ? ?No chief complaint on file. ? ? ?Brief Narrative:  ? ? ?This is a no charge note as patient was seen and admitted earlier today, patient was seen and examined, chart, imaging and labs were reviewed. ? ? ?Jacqueline Richmond is a 76 y.o. female with medical history significant for stage IV CKD, hypertension, type 2 diabetes mellitus, allergic rhinitis, who is admitted to [facility] on 06/03/2021 with hyperkalemia after presenting from home to [facility] ED complaining of fall.  ?-Her work-up was significant for worsening renal failure, hyperkalemia with potassium of> 7.5, she was admitted for further work-up. ? ?Assessment & Plan: ?  ?Principal Problem: ?  Hyperkalemia ?Active Problems: ?  Acute renal failure superimposed on stage 4 chronic kidney disease ([place]) ?  Primary hypertension ?  Hypercalcemia ?  Pulmonary nodule ?  DM2 (diabetes mellitus, type 2) ([place]) ?  Allergic rhinitis ? ?Hyperkalemia:  ?-Potassium significantly elevated at 7.6 on admission  ?-Most likely due to worsening renal function, type IV RTA possible but less likely given mild acidosis  ?- she did receive IV gluconate, IV insulin and D50, with albuterol , and she did receive Lokelma as well.-Improving, most recent is 5.9 . ?-Continue to monitor BMP closely, to receive Lokelma this afternoon. ?-Renal input greatly appreciated. ? ?Nonoliguric AKI in CKD stage IV ?-Baseline creatinine of 2, elevated to 4 shin, improved to 3.2 this morning. ?-Continue with IV fluids. ?-Avoid hypotension. ?-Hold nephrotoxic medications. ?-Patient with mild metabolic acidosis, on p.o. bicarb. ? ?Hypertension ?-Hold home medications given soft blood pressure ? ?Bibasilar pulmonary nodules ?-We will need repeat imaging in short.  As an outpatient, meanwhile continue to encourage using incentive spirometer. ? ?Essential Hypertension:  ?-Continue with home medications ?   ?Type 2 Diabetes Mellitus ?- Home insulin regimen: Humalog sliding scale twice daily..  Also on Januvia at home ?- accuchecks QAC and HS with low-dose SSI.  Hold home [place] for now. ?  ?Chronic lower extremity skin changes ?-Wound care consulted. ?  ?  ? ?DVT prophylaxis: [place] heaprin ?Code Status: Full ?Family Communication: None at bedside ?Disposition:  ? ?Status is: Inpatient ?Remains inpatient appropriate because: AKI/hyperkalemia ?  ?Consultants:  ?Renal ? ? ?Subjective: ? ?She had 2 episodes of loose bowel movement this morning, she denies nausea, vomiting Objective: ?Vitals:  ? 06/04/21 1000 06/04/21 1100 06/04/21 1200 06/04/21 1312  ?BP: 116/63 (!) 106/50 134/69   ?Pulse: 74 70 69   ?Resp: '18 14 15   '$ ?Temp:    97.8 ?F (36.6 ?C)  ?TempSrc:    Oral  ?SpO2: 100% 100% 100%   ?Weight:      ?Height:      ? ? ?Intake/Output Summary (Last 24 hours) at 06/04/2021 1358 ?Last data filed at 06/04/2021 1315 ?Gross per 24 hour  ?Intake 1384.94 ml  ?Output --  ?Net 1384.94 ml  ? ?Filed Weights  ? 06/04/21 0811  ?Weight: 65.8 kg  ? ? ?Examination: ? ?Awake Alert, Oriented X 3, No new F.N deficits, Normal affect ?Symmetrical Chest wall movement, Good air movement bilaterally, CTAB ?RRR,No Gallops,Rubs or new Murmurs, No Parasternal Heave ?+ve B.Sounds, Abd Soft, No tenderness, No rebound - guarding or rigidity. ?No Cyanosis, Clubbing or edema, chronic lower extremity skin changes ? ? ? ? ? ?Data Reviewed: I have personally reviewed following labs and imaging studies ? ?CBC: ?Recent Labs  ?Lab 06/03/21 ?  2255 06/03/21 ?2300 06/04/21 ?0720 06/04/21 ?1226  ?WBC  --  8.4 8.3  --   ?NEUTROABS  --  6.8 5.8  --   ?HGB 15.0 13.2 10.7* 11.6*  ?HCT 44.0 43.4 35.3* 34.0*  ?MCV  --  82.8 82.1  --   ?PLT  --  183 154  --   ? ? ?Basic Metabolic Panel: ?Recent Labs  ?Lab 06/03/21 ?2255 06/03/21 ?2301 06/04/21 ?0307 06/04/21 ?0720 06/04/21 ?1014 06/04/21 ?1157 06/04/21 ?1226  ?NA 139 136 140 138 139  --  141  ?K 7.6* >7.5* 6.7* 6.6* 5.9*  5.9* 5.9*  ?CL 111 106 114* 111 111  --   --   ?CO2  --  21* 19* 18* 21*  --   --   ?GLUCOSE 107* 113* 74 76 124*  --   --   ?BUN 64* 63* 60* 61* 57*  --   --   ?CREATININE 4.00* 3.63* 3.24* 3.44* 3.45*  --   --   ?CALCIUM  --  10.1 9.1 9.4 9.5  --   --   ?MG  --   --   --  1.9  --   --   --   ?PHOS  --   --   --  4.9*  --   --   --   ? ? ?GFR: ?Estimated Creatinine Clearance: 13.3 mL/min (A) (by C-G formula based on SCr of 3.45 mg/dL (H)). ? ?Liver Function Tests: ?Recent Labs  ?Lab 06/03/21 ?2301 06/04/21 ?0720  ?AST 17 15  ?ALT 15 12  ?ALKPHOS 179* 124  ?BILITOT 0.3 0.5  ?PROT 9.0* 7.0  ?ALBUMIN 3.5 2.7*  ? ? ?CBG: ?Recent Labs  ?Lab 06/04/21 ?0722 06/04/21 ?1131  ?GLUCAP 76 91  ? ? ? ?Recent Results (from the past 240 hour(s))  ?Resp Panel by RT-PCR (Flu A&B, Covid) Nasopharyngeal Swab     Status: None  ? Collection Time: 06/04/21 12:22 AM  ? Specimen: Nasopharyngeal Swab; Nasopharyngeal(NP) swabs in vial transport medium  ?Result Value Ref Range Status  ? SARS Coronavirus 2 by RT PCR NEGATIVE NEGATIVE Final  ?  Comment: (NOTE) ?SARS-CoV-2 target nucleic acids are NOT DETECTED. ? ?The SARS-CoV-2 RNA is generally detectable in upper respiratory ?specimens during the acute phase of infection. The lowest ?concentration of SARS-CoV-2 viral copies this assay can detect is ?138 copies/mL. A negative result does not preclude SARS-Cov-2 ?infection and should not be used as the sole basis for treatment or ?other patient management decisions. A negative result may occur with  ?improper specimen collection/handling, submission of specimen other ?than nasopharyngeal swab, presence of viral mutation(s) within the ?areas targeted by this assay, and inadequate number of viral ?copies(<138 copies/mL). A negative result must be combined with ?clinical observations, patient history, and epidemiological ?information. The expected result is Negative. ? ?Fact Sheet for Patients:  ?EntrepreneurPulse.com.au ? ?Fact  Sheet for Healthcare Providers:  ?IncredibleEmployment.be ? ?This test is no t yet approved or cleared by the [place] FDA and  ?has been authorized for detection and/or diagnosis of SARS-CoV-2 by ?FDA under an Emergency Use Authorization (EUA). This EUA will remain  ?in effect (meaning this test can be used) for the duration of the ?COVID-19 declaration under Section 564(b)(1) of the Act, 21 ?U.S.C.section 360bbb-3(b)(1), unless the authorization is terminated  ?or revoked sooner.  ? ? ?  ? Influenza A by PCR NEGATIVE NEGATIVE Final  ? Influenza B by PCR NEGATIVE NEGATIVE Final  ?  Comment: (NOTE) ?The Xpert  Xpress SARS-CoV-2/FLU/RSV plus assay is intended as an aid ?in the diagnosis of influenza from Nasopharyngeal swab specimens and ?should not be used as a sole basis for treatment. Nasal washings and ?aspirates are unacceptable for Xpert Xpress SARS-CoV-2/FLU/RSV ?testing. ? ?Fact Sheet for Patients: ?EntrepreneurPulse.com.au ? ?Fact Sheet for Healthcare Providers: ?IncredibleEmployment.be ? ?This test is not yet approved or cleared by the [place] FDA and ?has been authorized for detection and/or diagnosis of SARS-CoV-2 by ?FDA under an Emergency Use Authorization (EUA). This EUA will remain ?in effect (meaning this test can be used) for the duration of the ?COVID-19 declaration under Section 564(b)(1) of the Act, 21 U.S.C. ?section 360bbb-3(b)(1), unless the authorization is terminated or ?revoked. ? ?Performed at [facility], [place] [address] [place], [place] ?[address] ?  ?  ? ? ? ? ? ?Radiology Studies: ?CT Head Wo Contrast ? ?Result Date: 06/03/2021 ?CLINICAL DATA:  Fall EXAM: CT HEAD WITHOUT CONTRAST CT CERVICAL SPINE WITHOUT CONTRAST TECHNIQUE: Multidetector CT imaging of the head and cervical spine was performed following the standard protocol without intravenous contrast. Multiplanar CT image reconstructions of the cervical spine were  also generated. RADIATION DOSE REDUCTION: This exam was performed according to the departmental dose-optimization program which includes automated exposure control, adjustment of the mA and/or kV accordin

## 2021-06-04 NOTE — Assessment & Plan Note (Signed)
#)   Essential Hypertension: documented h/o such, with outpatient antihypertensive regimen including oral clonidine, atenolol.  SBP's in the ED today: In the low 100s.  ?  ?Plan: Close monitoring of subsequent BP via routine VS. in the setting of borderline hypotension, with likely contribution from dehydration, will hold home antihypertensive medications for now. ?

## 2021-06-04 NOTE — Assessment & Plan Note (Signed)
#)   Type 2 Diabetes Mellitus: documented history of such. Home insulin regimen: Humalog sliding scale twice daily..  Also on Januvia at home. presenting blood sugar: 113.  ?  ?Plan: accuchecks QAC and HS with low-dose SSI.  Hold home [place] for now. ?  ?  ?

## 2021-06-04 NOTE — ED Notes (Signed)
Breakfast order placed ?

## 2021-06-04 NOTE — Assessment & Plan Note (Signed)
#)   Hypercalcemia: Presenting calcium, adjusted for mild hypoalbuminemia noted to be mildly elevated at 10.5.  Suspect that this is on the basis of dehydration, as above.  No overt pharmacologic contributions, including no HCTZ as an outpatient.  We will pursue gentle IV fluid resuscitative efforts, and repeat calcium level in the morning, at which time, if no significant interval improvement with interval rehydration efforts, can further expand associated work-up. ?  ?Plan: Lactated Ringer's, as above.  Add on magnesium level check serum phosphorus level.  Repeat CMP in the morning. ?  ?

## 2021-06-04 NOTE — Assessment & Plan Note (Signed)
#)   Acute kidney injury superimposed on stage IV CKD: In the setting of most recent prior serum creatinine of 2.20 in November 2022, presenting serum creatinine noted to be 3.63.  Suspected contribution from dehydration given clinical evidence of such corresponding to the patient's reported a recent decline in oral intake, including that of water.  We will also check CPK level given presenting mechanical fall.  Urinalysis with microscopy ordered, with result currently pending. ?  ?Plan: Monitor strict I's and O's and daily weights.  Tempt avoid nephrotoxic agents.  Hold home gabapentin for now.  Follow-up result urinalysis with microscopy.  Add on Rumensin this was random urine creatinine.  Add on CPK level.  Continuous LR, as above.  Nephrology consulted, as above.  Repeat CMP in the morning.  Check serum magnesium and phosphorus levels. ?  ?  ?

## 2021-06-04 NOTE — ED Provider Notes (Signed)
?[facility] ?Provider Note ? ? ?CSN: [id number] ?Arrival date & time: 06/03/21  2236 ? ?  ? ?History ? ?No chief complaint on file. ? ? ?Jacqueline Richmond is a 76 y.o. female. ? ?HPI ? ?Patient with medical history including type 2 diabetes, gout, hypertension, CKD stage IV presents the emergency department after mechanical fall.  Patient states that she was walking up steps into her bed unfortunately her legs got tired and she fell backwards and hitting her head on the floor.  She denies losing conscious, she is not on anticoag's, she has no headaches, change in vision, paresthesia or weakness upper or lower extremities.  She denies any neck back chest pain pain in hips upper and/or lower extremities.  She states that her only complaint is pain in her right heel as she has an ulcer from PAD.  She states that she has weakness in her legs is gone for last few months and generally her legs will give out on her, patient states prior to falling she did not have any chest pain no shortness of breath no lightheaded no dizziness no unilateral weakness, she states that she has no other complaints this time. ? ?EMS was at bedside states that patient was found on the floor, states that she was on the floor for 20 minutes, did not given patient anything during transport. ? ? ? ?Home Medications ?Prior to Admission medications   ?Medication Sig Start Date End Date Taking? Authorizing Provider  ?allopurinol (ZYLOPRIM) 100 MG tablet Take 50 mg by mouth every other day.    [provider]  ?aspirin EC 81 MG tablet Take 81 mg by mouth daily. Swallow whole.    [provider]  ?atenolol (TENORMIN) 50 MG tablet Take 1 tablet (50 mg total) by mouth 2 (two) times daily. 03/04/21 06/02/21  Tobb, Godfrey Pick, DO  ?cloNIDine (CATAPRES) 0.2 MG tablet Take 0.2 mg by mouth 2 (two) times daily.    [provider]  ?clopidogrel (PLAVIX) 75 MG tablet Take 1 tablet (75 mg total) by mouth  daily. 04/02/21   Rhyne, Hulen Shouts, PA-C  ?furosemide (LASIX) 40 MG tablet Take 40 mg by mouth daily.    [provider]  ?gabapentin (NEURONTIN) 100 MG capsule Take 100 mg by mouth at bedtime. 12/10/20   [provider]  ?gabapentin (NEURONTIN) 300 MG capsule Take 300 mg by mouth at bedtime.    [provider]  ?insulin lispro (HUMALOG) 100 UNIT/ML KwikPen Inject 0-14 Units into the skin 2 (two) times daily before a meal. Blood sugar 0-200 = no coverage; 201-250 = 2 units; 251-300 = 4 units; 301-350 = 6 units; 351-400 = 8 units; 401-450 = 10 units; 451-500 = 12 units; >500 = 14 units and call MD. ?Patient not taking: Reported on 03/04/2021    [provider]  ?loratadine (CLARITIN) 10 MG tablet Take 10 mg by mouth daily.    [provider]  ?sitaGLIPtin (JANUVIA) 50 MG tablet Take 50 mg by mouth daily.    [provider]  ?   ? ?Allergies    ?Codeine, Iodinated contrast media, Pentazocine, and Statins   ? ?Review of Systems   ?Review of Systems  ?Constitutional:  Negative for chills and fever.  ?Respiratory:  Negative for shortness of breath.   ?Cardiovascular:  Negative for chest pain.  ?Gastrointestinal:  Negative for abdominal pain.  ?Neurological:  Negative for headaches.  ? ?Physical Exam ?Updated Vital Signs ?BP 99/60  Pulse 65   Temp 98.2 ?F (36.8 ?C)   Resp 18   SpO2 98%  ?Physical Exam ?Vitals and nursing note reviewed.  ?Constitutional:   ?   General: She is not in acute distress. ?   Appearance: She is not ill-appearing.  ?HENT:  ?   Head: Normocephalic and atraumatic.  ?   Comments: Patient is a horizontal laceration on the occipital lobe measuring proximately 3 cm in length 3 mm in depth, no foreign bodies present, no crepitus or deformities noted.  There is no raccoon eyes or battle sign present. ?   Nose: No congestion.  ?   Mouth/Throat:  ?   Mouth: Mucous membranes are dry.  ?   Pharynx: Oropharynx is clear. No oropharyngeal exudate or  posterior oropharyngeal erythema.  ?   Comments: No oral trauma ?Eyes:  ?   Extraocular Movements: Extraocular movements intact.  ?   Conjunctiva/sclera: Conjunctivae normal.  ?   Pupils: Pupils are equal, round, and reactive to light.  ?Cardiovascular:  ?   Rate and Rhythm: Normal rate and regular rhythm.  ?   Pulses: Normal pulses.  ?   Heart sounds: No murmur heard. ?  No friction rub. No gallop.  ?Pulmonary:  ?   Effort: No respiratory distress.  ?   Breath sounds: No wheezing, rhonchi or rales.  ?Chest:  ?   Chest wall: No tenderness.  ?Abdominal:  ?   Palpations: Abdomen is soft.  ?   Tenderness: There is no abdominal tenderness. There is no right CVA tenderness or left CVA tenderness.  ?Musculoskeletal:  ?   Comments: Spine was palpated she had tenderness along her thoracic spine, no step-off or deformities noted, patient has full range of motion, 5-5 strength in upper lower extremities, no pelvis instability, no leg shortening, neurovascular fully intact in the upper or lower extremities.  ?Skin: ?   General: Skin is warm and dry.  ?Neurological:  ?   Mental Status: She is alert.  ?   GCS: GCS eye subscore is 4. GCS verbal subscore is 5. GCS motor subscore is 6.  ?   Cranial Nerves: Cranial nerves 2-12 are intact. No cranial nerve deficit.  ?   Sensory: Sensation is intact.  ?   Motor: No weakness.  ?   Coordination: Romberg sign negative.  ?   Comments: No facial asymmetry, no difficulty with word finding, able to follow two step  commands, cranial nerves II through XII grossly intact, no unilateral weakness present.  ?Psychiatric:     ?   Mood and Affect: Mood normal.  ? ? ?ED Results / Procedures / Treatments   ?Labs ?(all labs ordered are listed, but only abnormal results are displayed) ?Labs Reviewed  ?CBC WITH DIFFERENTIAL/PLATELET - Abnormal; Notable for the following components:  ?    Result Value  ? RBC 5.24 (*)   ? MCH 25.2 (*)   ? RDW 17.6 (*)   ? All other components within normal limits   ?COMPREHENSIVE METABOLIC PANEL - Abnormal; Notable for the following components:  ? Potassium >7.5 (*)   ? CO2 21 (*)   ? Glucose, Bld 113 (*)   ? BUN 63 (*)   ? Creatinine, Ser 3.63 (*)   ? Total Protein 9.0 (*)   ? Alkaline Phosphatase 179 (*)   ? GFR, Estimated 13 (*)   ? All other components within normal limits  ?I-STAT CHEM 8, ED - Abnormal; Notable for the following components:  ?  Potassium 7.6 (*)   ? BUN 64 (*)   ? Creatinine, Ser 4.00 (*)   ? Glucose, Bld 107 (*)   ? All other components within normal limits  ?RESP PANEL BY RT-PCR (FLU A&B, COVID) [id number]  ?URINALYSIS, ROUTINE W REFLEX MICROSCOPIC  ?BASIC METABOLIC PANEL  ?POTASSIUM  ?POTASSIUM  ?POTASSIUM  ?POTASSIUM  ?POTASSIUM  ? ? ?EKG ?EKG Interpretation ? ?Date/Time:  Monday June 03 2021 23:48:41 EDT ?Ventricular Rate:  66 ?PR Interval:  197 ?QRS Duration: 117 ?QT Interval:  376 ?QTC Calculation: 394 ?R Axis:   83 ?Text Interpretation: Sinus rhythm Nonspecific intraventricular conduction delay Low voltage, precordial leads Confirmed by Veryl Speak [phone]) on 06/04/2021 2:57:32 AM ? ?Radiology ?CT Head Wo Contrast ? ?Result Date: 06/03/2021 ?CLINICAL DATA:  Fall EXAM: CT HEAD WITHOUT CONTRAST CT CERVICAL SPINE WITHOUT CONTRAST TECHNIQUE: Multidetector CT imaging of the head and cervical spine was performed following the standard protocol without intravenous contrast. Multiplanar CT image reconstructions of the cervical spine were also generated. RADIATION DOSE REDUCTION: This exam was performed according to the departmental dose-optimization program which includes automated exposure control, adjustment of the mA and/or kV according to patient size and/or use of iterative reconstruction technique. COMPARISON:  CT chest 07/31/2020. FINDINGS: CT HEAD FINDINGS Brain: No evidence of large-territorial acute infarction. No parenchymal hemorrhage. No mass lesion. No extra-axial collection. No mass effect or midline shift. No hydrocephalus. Basilar cisterns  are patent. Vascular: No hyperdense vessel. Skull: No acute fracture or focal lesion. Sinuses/Orbits: Paranasal sinuses and mastoid air cells are clear. The orbits are unremarkable. Other: Mild left parietal scalp edema w

## 2021-06-05 DIAGNOSIS — N179 Acute kidney failure, unspecified: Secondary | ICD-10-CM | POA: Diagnosis not present

## 2021-06-05 DIAGNOSIS — E119 Type 2 diabetes mellitus without complications: Secondary | ICD-10-CM | POA: Diagnosis not present

## 2021-06-05 DIAGNOSIS — E875 Hyperkalemia: Secondary | ICD-10-CM | POA: Diagnosis not present

## 2021-06-05 DIAGNOSIS — R911 Solitary pulmonary nodule: Secondary | ICD-10-CM | POA: Diagnosis not present

## 2021-06-05 LAB — BASIC METABOLIC PANEL
Anion gap: 11 (ref 5–15)
BUN: 51 mg/dL — ABNORMAL HIGH (ref 8–23)
CO2: 15 mmol/L — ABNORMAL LOW (ref 22–32)
Calcium: 8.8 mg/dL — ABNORMAL LOW (ref 8.9–10.3)
Chloride: 111 mmol/L (ref 98–111)
Creatinine, Ser: 3 mg/dL — ABNORMAL HIGH (ref 0.44–1.00)
GFR, Estimated: 16 mL/min — ABNORMAL LOW (ref >60)
Glucose, Bld: 70 mg/dL (ref 70–99)
Potassium: 5.4 mmol/L — ABNORMAL HIGH (ref 3.5–5.1)
Sodium: 137 mmol/L (ref 135–145)

## 2021-06-05 LAB — GLUCOSE, CAPILLARY
Glucose-Capillary: 72 mg/dL (ref 70–99)
Glucose-Capillary: 124 mg/dL — ABNORMAL HIGH (ref 70–99)
Glucose-Capillary: 85 mg/dL (ref 70–99)
Glucose-Capillary: 112 mg/dL — ABNORMAL HIGH (ref 70–99)
Glucose-Capillary: 84 mg/dL (ref 70–99)

## 2021-06-05 LAB — CBC
HCT: 34.1 % — ABNORMAL LOW (ref 36.0–46.0)
Hemoglobin: 10.3 g/dL — ABNORMAL LOW (ref 12.0–15.0)
MCH: 25 pg — ABNORMAL LOW (ref 26.0–34.0)
MCHC: 30.2 g/dL (ref 30.0–36.0)
MCV: 82.8 fL (ref 80.0–100.0)
Platelets: 152 10*3/uL (ref 150–400)
RBC: 4.12 MIL/uL (ref 3.87–5.11)
RDW: 17.2 % — ABNORMAL HIGH (ref 11.5–15.5)
WBC: 8.4 10*3/uL (ref 4.0–10.5)
nRBC: 0 % (ref 0.0–0.2)

## 2021-06-05 MED ORDER — CHLORHEXIDINE GLUCONATE CLOTH 2 % EX PADS
6.0000 | MEDICATED_PAD | Freq: Every day | CUTANEOUS | Status: DC
  Administered 2021-06-06 – 2021-06-07 (×2): 6 via TOPICAL

## 2021-06-05 MED ORDER — SODIUM BICARBONATE 650 MG PO TABS
1300.0000 mg | ORAL_TABLET | Freq: Three times a day (TID) | ORAL | Status: DC
  Administered 2021-06-05 – 2021-06-07 (×7): 1300 mg via ORAL
  Filled 2021-06-05 (×7): qty 2

## 2021-06-05 MED ORDER — SODIUM ZIRCONIUM CYCLOSILICATE 10 G PO PACK
10.0000 g | PACK | Freq: Two times a day (BID) | ORAL | Status: DC
  Administered 2021-06-05: 10 g via ORAL
  Filled 2021-06-05: qty 1

## 2021-06-05 MED ORDER — SODIUM ZIRCONIUM CYCLOSILICATE 10 G PO PACK
10.0000 g | PACK | Freq: Once | ORAL | Status: AC
  Administered 2021-06-05: 10 g via ORAL
  Filled 2021-06-05: qty 1

## 2021-06-05 NOTE — Progress Notes (Signed)
Pt was yelling out for help. Pt states that "there are snakes on the floor and in my bed" Reassured pt that she was in the hospital and there were no snakes in the bed with her. Pt now calm and resting comfortably call bell in reach with bed alarm set. Provider [place] aware, will continue to monitor .  ? ? 06/05/21 0303  ?Vitals  ?Temp 98.8 ?F (37.1 ?C)  ?Temp Source Oral  ?BP (!) 128/55  ?MAP (mmHg) 78  ?BP Location Right Arm  ?BP Method Automatic  ?Patient Position (if appropriate) Lying  ?Pulse Rate 88  ?Pulse Rate Source Monitor  ?ECG Heart Rate 88  ?Resp (!) 21  ?Level of Consciousness  ?Level of Consciousness Alert  ?MEWS COLOR  ?MEWS Score Color Green  ?Oxygen Therapy  ?SpO2 97 %  ?O2 Device Room Air  ?MEWS Score  ?MEWS Temp 0  ?MEWS Systolic 0  ?MEWS Pulse 0  ?MEWS RR 1  ?MEWS LOC 0  ?MEWS Score 1  ? ? ?

## 2021-06-05 NOTE — TOC Initial Note (Addendum)
Transition of Care [facility]) - Initial/Assessment Note    Patient Details  Name: Jacqueline Richmond MRN: [id number] Date of Birth: [birth year]  [facility]) CM/SW Contact:    Mearl Latin, LCSW Phone Number: 06/05/2021, 4:58 PM  Clinical Narrative:                 CSW received consult for possible SNF placement at time of discharge. CSW spoke with patient. Patient reported that patient's spouse is currently unable to care for patient at their home given patients current physical needs and fall risk. Patient expressed understanding of PT recommendation and is agreeable to SNF placement at time of discharge. Patient reports preference for [facility]. CSW discussed insurance authorization process and will provide Medicare SNF ratings list. Patient has received COVID vaccines. CSW will send out referrals for review. Patient did appear to be a bit confused at times so will follow up with family as well. Left voicemail for [facility] but do not think they are in network with patient's insurance.   Skilled Nursing Rehab Facilities-   [email or web address]   Ratings out of 5 possible   Name Address  Phone # Quality Care Staffing Health Inspection Overall  [facility] [address] [place] [phone] 5 2 4   [facility]  [address], [place] [phone] 4 2 5 5   [facility] [address] [place], [place] [phone] 4 1 1 1   [facility] [address] [phone] 2 2 4 4   [facility] [address] [place] [phone] 1 1 2 1   [facility] [phone] [address], [place] [phone] 2 1 4 3   [facility] [address] [place] [phone] 5 2 2 3   [facility] [address] [place] [phone] 5 2 2 3   [address] ([place]) [address], [place] [phone] 5 1 2 2   [facility] [phone] [address], [place] Otto [phone] 4 1 1 1    [facility] [address] [facility] [phone] 4 1 2 1   [facility] ([place]) [address], [place] [phone] 4 1 1 1           [facility] [address], [place] [phone]      [facility] [address] [place] [phone] 4 2 3 3   [facility] [address] [place] [phone] 3 1 5 4   [address], [place], [place] [phone] 2 1 1 1   [facility] [address], [employer] [phone] 2 2 3 3           [address] (no [facility]) [address], [place] [phone] 4 5 5 5   [facility] (No [employer]) [address] [place], [place] [phone] 4 1 4 3   [facility] (No [employer]) [address], [place] [phone] 5 5 5 5   [facility] [address], [place] [phone] 3 2 4 4   [facility] [address] [place] [phone] 3 3 4 4   [facility] [address] [place] [phone] 1 1 2 1   [facility] [address] [place] [phone] 2 1 1 1   [place] [address] [place] [phone] 5 2 4 5   [facility] [address] [place] [phone] 3 1 1 1   [facility] [address] [place], [place] [phone] 2 1 2 1           [facility] [address], [place]  [phone] 1 1 1 1   [facility] [address], [place]lyn Clines  [phone] 2 4 2 2   [facility] [place] [address], [place]an [phone] 5 2 3 4   [facility] [address], [place] [phone] 2 1 1 1   [facility] (No [employer]) [address], [place] [phone] 2 1 3 2   [facility] [address] [place] [phone] 3 1 1 1           [facility] [address] [place], [place] [phone] 5 4 5 5   [facility])  [address], [place] [phone] 2 2 3 3   [facility]) [address], [place] [phone] 3 2 4 4   [facility] [address] [place] [phone] 4 3 4 4   [address] [place], [place] [phone] 3 3 1 1   [facility]) [address] [place] [phone] 2 2 4 4      Expected Discharge Plan: Skilled Nursing Facility Barriers to Discharge: Continued Medical Work up, SNF Pending bed offer   Patient Goals and CMS Choice Patient states their goals for this hospitalization and ongoing recovery are:: Rehab CMS Medicare.gov Compare Post Acute Care list provided to:: Patient Choice offered to / list presented to : Patient  Expected Discharge Plan and Services Expected Discharge Plan: Skilled Nursing Facility In-house Referral: Clinical Social Work   Post Acute Care Choice: Skilled Nursing Facility Living arrangements for the past 2 months: Single Family Home                                      Prior Living Arrangements/Services Living arrangements for the past 2 months: Single Family Home Lives with:: Spouse Patient language and need for interpreter reviewed:: Yes Do you feel safe going back to the place where you live?: Yes      Need for Family Participation in Patient Care: Yes (Comment) Care giver support system in place?: Yes (comment)   Criminal Activity/Legal Involvement Pertinent to Current Situation/Hospitalization: No - Comment as needed  Activities of Daily Living Home Assistive Devices/Equipment: None ADL Screening (condition at time of admission) Patient's cognitive ability adequate to safely complete daily activities?: Yes Is the patient deaf or have difficulty hearing?: Yes Does the patient have difficulty seeing, even when wearing glasses/contacts?: No Does the patient have difficulty concentrating, remembering, or making decisions?: No Patient able to express need for assistance with ADLs?: Yes Does the patient have difficulty dressing or bathing?: Yes Independently performs ADLs?: No Communication: Appropriate for  developmental age Dressing (OT): Appropriate for developmental age Grooming: Appropriate for developmental age Feeding: Independent Bathing: Appropriate for developmental age Does the patient have difficulty walking or climbing stairs?: Yes Weakness of Legs: Both Weakness of Arms/Hands: Both  Permission Sought/Granted Permission sought to share information with : Facility Medical sales representative, Family Supports Permission granted to share information with : Yes, Verbal Permission Granted  Share Information with NAME: Victorino Dike  Permission granted to share info w AGENCY: SNFs  Permission granted to share info w Relationship: Daughter in law  Permission granted to share info w Contact Information: [phone]  Emotional Assessment Appearance:: Appears stated age Attitude/Demeanor/Rapport: Gracious Affect (typically observed): Accepting, Appropriate (Somewhat confused) Orientation: : Oriented to Self, Oriented to Place, Oriented to  Time, Oriented to Situation Alcohol / Substance Use: Not Applicable Psych Involvement: No (comment)  Admission diagnosis:  Hyperkalemia [E87.5] AKI (acute kidney injury) (HCC) [N17.9] Fall, initial encounter [W19.XXXA] Patient Active Problem List   Diagnosis Date Noted   Hypercalcemia 06/04/2021   Pulmonary nodule 06/04/2021   PAD (peripheral artery disease) (HCC) 03/07/2021   Bilateral leg edema 03/07/2021   Stage 3a chronic kidney disease (HCC) 03/07/2021   Insulin-requiring or dependent type II diabetes mellitus (HCC) 03/07/2021   Mixed hyperlipidemia 03/07/2021   Primary hypertension 03/07/2021   Venous insufficiency    Cellulitis of right lower leg    Leg wound, right 12/18/2020   Diabetic foot (HCC) 12/18/2020   Body mass index (BMI) 27.0-27.9, adult 09/19/2020   Wound dehiscence 08/30/2020   Multiple thyroid nodules 08/10/2020   Acute blood loss anemia 08/09/2020   Hemothorax on right 08/02/2020   T10 vertebral fracture (HCC) 07/31/2020    Diabetic polyneuropathy associated with type 2 diabetes mellitus (HCC) 05/23/2020   Rotator cuff arthropathy of both shoulders 04/15/2019   History of colonic polyps    Polyp of transverse colon    Diverticulosis of colon without hemorrhage    Difficult intubation    Acute pain of right knee 07/30/2016   Chronic left shoulder pain 06/24/2016   Right hip pain 10/01/2015   At risk for falling 09/10/2015   Need for hepatitis C screening test 09/10/2015   Encounter for general adult medical examination without abnormal findings 08/27/2013   Chronic gouty arthropathy 06/03/2013   Body mass index 33.0-33.9, adult 06/03/2013   Psoriasis and similar disorder 08/06/2012   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   Uncontrolled secondary diabetes mellitus with stage 3 CKD (GFR 30-59) (HCC) 08/29/2011   CKD (chronic kidney disease) stage 3, GFR 30-59 ml/min (HCC) 08/29/2011   Acute renal failure superimposed on stage 4 chronic kidney disease (HCC) 08/29/2011   Hyperkalemia 08/29/2011   HTN (hypertension) 08/29/2011   Gout 08/29/2011   DM2 (diabetes mellitus, type 2) (HCC) 08/29/2011   Allergic rhinitis 08/29/2011   Osteoarthrosis, 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 AT CORNER OF [facility] [address] [place] [address] Phone: [phone] Fax: [phone]     Social Determinants of Health (SDOH) Interventions    Readmission Risk Interventions No flowsheet data found.

## 2021-06-05 NOTE — [place] FL2 (Signed)
?[place] MEDICAID FL2 LEVEL OF CARE SCREENING TOOL  ?  ? ?IDENTIFICATION  ?Patient Name: ?Jacqueline Richmond Birthdate: 1945/11/16 Sex: female Admission Date (Current Location): ?06/03/2021  ?[place] and [place] Number: ? [place] ?  Facility and Address:  ?[facility], [place] [address], [place], [place] [address] ?     Provider Number: ?[id number]  ?Attending Physician Name and Address:  ?Jonetta Osgood, MD ? Relative Name and Phone Number:  ?  ?   ?Current Level of Care: ?Hospital Recommended Level of Care: ?[place] Prior Approval Number: ?  ? ?Date Approved/Denied: ?  PASRR Number: ?[id number] A ? ?Discharge Plan: ?SNF ?  ? ?Current Diagnoses: ?Patient Active Problem List  ? Diagnosis Date Noted  ? Hypercalcemia 06/04/2021  ? Pulmonary nodule 06/04/2021  ? PAD (peripheral artery disease) ([place]) 03/07/2021  ? Bilateral leg edema 03/07/2021  ? Stage 3a chronic kidney disease ([place]) 03/07/2021  ? Insulin-requiring or dependent type II diabetes mellitus ([place]) 03/07/2021  ? Mixed hyperlipidemia 03/07/2021  ? Primary hypertension 03/07/2021  ? Venous insufficiency   ? Cellulitis of right lower leg   ? Leg wound, right 12/18/2020  ? Diabetic foot ([place]) 12/18/2020  ? Body mass index (BMI) 27.0-27.9, adult 09/19/2020  ? Wound dehiscence 08/30/2020  ? Multiple thyroid nodules 08/10/2020  ? Acute blood loss anemia 08/09/2020  ? Hemothorax on right 08/02/2020  ? T10 vertebral fracture ([place]) 07/31/2020  ? Diabetic polyneuropathy associated with type 2 diabetes mellitus ([place]) 05/23/2020  ? Rotator cuff arthropathy of both shoulders 04/15/2019  ? History of colonic polyps   ? Polyp of transverse colon   ? Diverticulosis of colon without hemorrhage   ? Difficult intubation   ? Acute pain of right knee 07/30/2016  ? Chronic left shoulder pain 06/24/2016  ? Right hip pain 10/01/2015  ? At risk for falling 09/10/2015  ? Need for hepatitis C screening test 09/10/2015  ? Encounter for general adult  medical examination without abnormal findings 08/27/2013  ? Chronic gouty arthropathy 06/03/2013  ? Body mass index 33.0-33.9, adult 06/03/2013  ? Psoriasis and similar disorder 08/06/2012  ? 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 Uncontrolled secondary diabetes mellitus with stage 3 CKD (GFR 30-59) ([place]) 08/29/2011  ? CKD (chronic kidney disease) stage 3, GFR 30-59 ml/min (HCC) 08/29/2011  ? Acute renal failure superimposed on stage 4 chronic kidney disease ([place]) 08/29/2011  ? Hyperkalemia 08/29/2011  ? HTN (hypertension) 08/29/2011  ? Gout 08/29/2011  ? DM2 (diabetes mellitus, type 2) ([place]) 08/29/2011  ? Allergic rhinitis 08/29/2011  ? 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 Vitamin D deficiency 12/09/2009  ? DDD (degenerative disc disease), lumbosacral 11/30/2009  ? ? ?Orientation RESPIRATION BLADDER Height & Weight   ?  ?Self, Time, Situation, Place ? Normal Indwelling catheter, Continent Weight: 145 lb (65.8 kg) ?Height:  '5\' 4"'$  (162.6 cm)  ?BEHAVIORAL SYMPTOMS/MOOD NEUROLOGICAL BOWEL NUTRITION STATUS  ?    Incontinent Diet (See dc summary)  ?AMBULATORY STATUS COMMUNICATION OF NEEDS Skin   ?Extensive Assist Verbally Other (Comment) (Wound on pretibial w/guaze) ?  ?  ?  ?    ?     ?     ? ? ?Personal Care Assistance Level of Assistance  ?Bathing, Feeding, Dressing Bathing Assistance: Maximum assistance ?Feeding assistance: Independent ?Dressing Assistance: Limited assistance ?   ? ?Functional Limitations Info  ?Hearing   ?Hearing Info: Impaired ?   ? ? ?SPECIAL CARE FACTORS FREQUENCY  ?PT (By licensed PT), OT (  By licensed OT)   ?  ?PT Frequency: 5x/week ?OT Frequency: 5x/week ?  ?  ?  ?   ? ? ?Contractures Contractures Info: Not present  ? ? ?Additional Factors Info  ?Code Status, Allergies Code Status Info: Full ?Allergies Info: Codeine, Iodinated Contrast Media, Pentazocine, Statins ?  ?  ?  ?   ? ?Current Medications (06/05/2021):  This is the current hospital active medication list ?Current Facility-Administered  Medications  ?Medication Dose Route Frequency Provider Last Rate Last Admin  ? 0.9 %  sodium chloride infusion   Intravenous Continuous Reesa Chew, MD 100 mL/hr at 06/05/21 0559 New Bag at 06/05/21 0559  ? acetaminophen (TYLENOL) tablet 650 mg  650 mg Oral Q6H PRN Howerter, Justin B, DO      ? Or  ? acetaminophen (TYLENOL) suppository 650 mg  650 mg Rectal Q6H PRN Howerter, Justin B, DO      ? clopidogrel (PLAVIX) tablet 75 mg  75 mg Oral Daily Howerter, Justin B, DO   75 mg at 06/05/21 0817  ? heparin injection 5,000 Units  5,000 Units Subcutaneous Q8H Elgergawy, Silver Huguenin, MD   5,000 Units at 06/05/21 1303  ? insulin aspart (novoLOG) injection 0-6 Units  0-6 Units Subcutaneous TID WC Howerter, Justin B, DO      ? loratadine (CLARITIN) tablet 10 mg  10 mg Oral Daily Howerter, Justin B, DO   10 mg at 06/05/21 5176  ? sodium bicarbonate tablet 1,300 mg  1,300 mg Oral TID Justin Mend, MD      ? ? ? ?Discharge Medications: ?Please see discharge summary for a list of discharge medications. ? ?Relevant Imaging Results: ? ?Relevant Lab Results: ? ? ?Additional Information ?SSN [id number]  [place] COVID-19 Vaccine 07/17/2020 , 11/22/2019 , 05/26/2019 , 05/01/2019 ? ?Lissa Morales Donjuan Robison, LCSW ? ? ? ? ?

## 2021-06-05 NOTE — Evaluation (Signed)
Occupational Therapy Evaluation Patient Details Name: Jacqueline Richmond MRN: [id number] DOB: [birth year] Today's Date: 06/05/2021   History of Present Illness 76 y.o. female presenting to ED 3/13 s/p mechanical fall off her bed. Reports hitting her head. Denies LOC. Patient admitted with hyperkalemia. Of note imaging (+) for R lower lobe pulmonary nodules, pleural effusion, basilar atelectasis, and parenchymal opacities bilaterally. PMHx significant for MDII, AKI, HTN, HLD, CKD stage IV, gout, diverticulosis, and T10 fracture.   Clinical Impression   PTA patient was living with her spouse in a private residence with bedroom/bathroom on main level and was requiring assist with ADLs/IADLs. Patient reports assist from PCA (M-F 4hrs/day) for bathing/dressing. Reports completing toileting tasks with Mod I and use of RW. Patient currently functioning below baseline demonstrating observed ADLs with Mod-Max A overall. Mod assist for sit to stand from EOB without AD and Min A +2 for steps to recliner without AD. Patient also limited by deficits listed below including decreased balance, decreased activity tolerance and generalized weakness and would benefit from continued acute OT services in prep for safe d/c to next level of care. OT wil continue to follow acutely.        Recommendations for follow up therapy are one component of a multi-disciplinary discharge planning process, led by the attending physician.  Recommendations may be updated based on patient status, additional functional criteria and insurance authorization.   Follow Up Recommendations  Skilled nursing-short term rehab (<3 hours/day)    Assistance Recommended at Discharge Frequent or constant Supervision/Assistance  Patient can return home with the following A lot of help with walking and/or transfers;A lot of help with bathing/dressing/bathroom    Functional Status Assessment  Patient has had a recent decline in their functional status and  demonstrates the ability to make significant improvements in function in a reasonable and predictable amount of time.  Equipment Recommendations  Other (comment) (Defer to next level of care.)    Recommendations for Other Services       Precautions / Restrictions Precautions Precautions: Fall Precaution Comments: High fall risk Restrictions Weight Bearing Restrictions: No      Mobility Bed Mobility Overal bed mobility: Needs Assistance Bed Mobility: Supine to Sit     Supine to sit: Mod assist     General bed mobility comments: Able to progress BLE toward EOB. Mod A to elevate trunk.    Transfers Overall transfer level: Needs assistance Equipment used: 2 person hand held assist Transfers: Sit to/from Stand, Bed to chair/wheelchair/BSC Sit to Stand: Mod assist Stand pivot transfers: Min assist, +2 physical assistance, +2 safety/equipment         General transfer comment: Mod A for sit to stand from EOB and Min A +2 for stand-pivot to recliner with HHA.      Balance Overall balance assessment: Needs assistance Sitting-balance support: Single extremity supported, Bilateral upper extremity supported, Feet supported Sitting balance-Leahy Scale: Fair   Postural control: Posterior lean Standing balance support: Bilateral upper extremity supported, During functional activity Standing balance-Leahy Scale: Poor Standing balance comment: Reliant on external assist.                           ADL either performed or assessed with clinical judgement   ADL Overall ADL's : Needs assistance/impaired     Grooming: Set up;Sitting Grooming Details (indicate cue type and reason): Face washing seated EOB with set-up assist.         Upper Body Dressing :  Minimal assistance;Sitting Upper Body Dressing Details (indicate cue type and reason): Donned posterior hosptial gown seated EOB with Min A. Lower Body Dressing: Maximal assistance   Toilet Transfer: Minimal  assistance;+2 for physical assistance;+2 for safety/equipment Toilet Transfer Details (indicate cue type and reason): Simulated with transfer to recliner with Min A +2 and HHA.                 Vision Baseline Vision/History: 1 Wears glasses Ability to See in Adequate Light: 0 Adequate Patient Visual Report: No change from baseline Vision Assessment?: No apparent visual deficits     Perception     Praxis      Pertinent Vitals/Pain Pain Assessment Pain Assessment: Faces Faces Pain Scale: Hurts little more Pain Location: R heel Pain Descriptors / Indicators: Aching, Sore Pain Intervention(s): Limited activity within patient's tolerance, Monitored during session, Repositioned     Hand Dominance Right   Extremity/Trunk Assessment Upper Extremity Assessment Upper Extremity Assessment: Generalized weakness   Lower Extremity Assessment Lower Extremity Assessment: Defer to PT evaluation   Cervical / Trunk Assessment Cervical / Trunk Assessment: Kyphotic   Communication Communication Communication: HOH   Cognition Arousal/Alertness: Awake/alert Behavior During Therapy: WFL for tasks assessed/performed Overall Cognitive Status: Within Functional Limits for tasks assessed                                       General Comments  BLE wrapped. Small area of blood noted on LLE. RN made aware.    Exercises     Shoulder Instructions      Home Living Family/patient expects to be discharged to:: Private residence Living Arrangements: Spouse/significant other (Spouse is elderly and unable to provide physical assist.) Available Help at Discharge: Family;Personal care attendant Type of Home: House Home Access: Level entry     Home Layout: Two level;Able to live on main level with bedroom/bathroom (Bedroom/bathroom on main level) Alternate Level Stairs-Number of Steps: 13 Alternate Level Stairs-Rails: Right Bathroom Shower/Tub: Higher education careers adviser: Handicapped height Bathroom Accessibility: Yes How Accessible: Accessible via walker Home Equipment: Rolling Walker (2 wheels)   Additional Comments: PCA M-F for 4hrs.      Prior Functioning/Environment Prior Level of Function : Needs assist       Physical Assist : Mobility (physical);ADLs (physical)     Mobility Comments: RW for mobility in household. ADLs Comments: Mod I with toilet transfers and toileting. Assist from PCA for bathing and LB dressing.        OT Problem List: Decreased strength;Decreased activity tolerance;Impaired balance (sitting and/or standing);Decreased safety awareness;Decreased knowledge of use of DME or AE;Pain      OT Treatment/Interventions: Therapeutic exercise;Self-care/ADL training;Energy conservation;DME and/or AE instruction;Therapeutic activities;Patient/family education;Balance training    OT Goals(Current goals can be found in the care plan section) Acute Rehab OT Goals Patient Stated Goal: No goals stated. OT Goal Formulation: With patient Time For Goal Achievement: 06/19/21 Potential to Achieve Goals: Good ADL Goals Pt Will Perform Grooming: with min guard assist;standing Pt Will Perform Upper Body Dressing: with set-up;sitting Pt Will Perform Lower Body Dressing: with min assist;sit to/from stand Pt Will Transfer to Toilet: with supervision;ambulating Pt Will Perform Toileting - Clothing Manipulation and hygiene: with min assist;sit to/from stand  OT Frequency: Min 2X/week    Co-evaluation              AM-PAC OT "6 Clicks" Daily Activity  Outcome Measure Help from another person eating meals?: A Little Help from another person taking care of personal grooming?: A Little Help from another person toileting, which includes using toliet, bedpan, or urinal?: Total Help from another person bathing (including washing, rinsing, drying)?: Total Help from another person to put on and taking off regular upper body clothing?:  A Little Help from another person to put on and taking off regular lower body clothing?: A Lot 6 Click Score: 13   End of Session Equipment Utilized During Treatment: Gait belt Nurse Communication: Mobility status  Activity Tolerance: Patient tolerated treatment well Patient left: in chair;with call bell/phone within reach;with chair alarm set  OT Visit Diagnosis: Unsteadiness on feet (R26.81);Muscle weakness (generalized) (M62.81);History of falling (Z91.81);Other symptoms and signs involving cognitive function                Time: 1210-1230 OT Time Calculation (min): 20 min Charges:  OT General Charges $OT Visit: 1 Visit OT Evaluation $OT Eval Moderate Complexity: 1 Mod  Merek Niu H. OTR/L Supplemental OT, Department of rehab services [phone]  Amera Banos R H. 06/05/2021, 2:00 PM

## 2021-06-05 NOTE — Progress Notes (Signed)
Started 24 Hr urine collection at 1740. Urine jug at bedside in ice. ?

## 2021-06-05 NOTE — Progress Notes (Signed)
?[facility] ?Progress Note  ? ?Subjective:   Feeling ok - seems diarrhea is much less now.   ?Foley placed yesterday after bladder scan 923m - I/Os 384 / 1500. ?Cr improved to 3 this AM, K 5.4.  ? ?Objective ?Vitals:  ? 06/04/21 2000 06/04/21 2321 06/05/21 0303 06/05/21 0734  ?BP: (!) 117/53 (!) 129/59 (!) 128/55 138/64  ?Pulse: 69 83 88 85  ?Resp: 16 18 (!) 21 16  ?Temp: (!) 97.5 ?F (36.4 ?C) 97.7 ?F (36.5 ?C) 98.8 ?F (37.1 ?C) 98.6 ?F (37 ?C)  ?TempSrc: Oral Oral Oral Oral  ?SpO2: 98% 97% 97% 98%  ?Weight:      ?Height:      ? ?Physical Exam ?General:  awake and comfortable ?ENT: MM dry - says chronically  ?Heart: RRR ?Lungs: clear ant ?Abdomen: soft ?Extremities:  no edema ?GU: foley draining clear yellow urine ? ?Additional Objective ?Labs: ?Basic Metabolic Panel: ?Recent Labs  ?Lab 06/04/21 ?0720 06/04/21 ?1014 06/04/21 ?1157 06/04/21 ?1226 06/04/21 ?1846 06/05/21 ?0448  ?NA 138 139  --  141 138 137  ?K 6.6* 5.9*   < > 5.9* 5.7* 5.4*  ?CL 111 111  --   --  110 111  ?CO2 18* 21*  --   --  19* 15*  ?GLUCOSE 76 124*  --   --  114* 70  ?BUN 61* 57*  --   --  56* 51*  ?CREATININE 3.44* 3.45*  --   --  3.28* 3.00*  ?CALCIUM 9.4 9.5  --   --  9.0 8.8*  ?PHOS 4.9*  --   --   --   --   --   ? < > = values in this interval not displayed.  ? ? ?Liver Function Tests: ?Recent Labs  ?Lab 06/03/21 ?2301 06/04/21 ?0720  ?AST 17 15  ?ALT 15 12  ?ALKPHOS 179* 124  ?BILITOT 0.3 0.5  ?PROT 9.0* 7.0  ?ALBUMIN 3.5 2.7*  ? ? ?No results for input(s): LIPASE, AMYLASE in the last 168 hours. ?CBC: ?Recent Labs  ?Lab 06/03/21 ?2300 06/04/21 ?0720 06/04/21 ?1226 06/05/21 ?0448  ?WBC 8.4 8.3  --  8.4  ?NEUTROABS 6.8 5.8  --   --   ?HGB 13.2 10.7* 11.6* 10.3*  ?HCT 43.4 35.3* 34.0* 34.1*  ?MCV 82.8 82.1  --  82.8  ?PLT 183 154  --  152  ? ? ?Blood Culture ?   ?Component Value Date/Time  ? [place] CLEAN CATCH 12/21/2020 1157  ? SKeshenaNONE 12/21/2020 1157  ? CULT  12/21/2020 1157  ?  NO GROWTH ?[facility] [id number] [place] [address]?  ? REPTSTATUS 12/22/2020 FINAL 12/21/2020 1157  ? ? ?Cardiac Enzymes: ?Recent Labs  ?Lab 06/04/21 ?0720 06/04/21 ?1846  ?CKTOTAL 30* 29*  ? ? ?CBG: ?Recent Labs  ?Lab 06/04/21 ?1131 06/04/21 ?1523 06/04/21 ?2108 06/05/21 ?0416303/15/23 ?0900  ?GLUCAP 91 91 77 72 124*  ? ? ?Iron Studies: No results for input(s): IRON, TIBC, TRANSFERRIN, FERRITIN in the last 72 hours. ?'@lablastinr3'$ @ ?Studies/Results: ?CT Head Wo Contrast ? ?Result Date: 06/03/2021 ?CLINICAL DATA:  Fall EXAM: CT HEAD WITHOUT CONTRAST CT CERVICAL SPINE WITHOUT CONTRAST TECHNIQUE: Multidetector CT imaging of the head and cervical spine was performed following the standard protocol without intravenous contrast. Multiplanar CT image reconstructions of the cervical spine were also generated. RADIATION DOSE REDUCTION: This exam was performed according to the departmental dose-optimization program which includes automated exposure control, adjustment  of the mA and/or kV according to patient size and/or use of iterative reconstruction technique. COMPARISON:  CT chest 07/31/2020. FINDINGS: CT HEAD FINDINGS Brain: No evidence of large-territorial acute infarction. No parenchymal hemorrhage. No mass lesion. No extra-axial collection. No mass effect or midline shift. No hydrocephalus. Basilar cisterns are patent. Vascular: No hyperdense vessel. Skull: No acute fracture or focal lesion. Sinuses/Orbits: Paranasal sinuses and mastoid air cells are clear. The orbits are unremarkable. Other: Mild left parietal scalp edema with no large hematoma formation (4:24). Partially visualized vertex scalp soft tissue density with calcification (6:40). CT CERVICAL SPINE FINDINGS Alignment: Straightening of the normal cervical lordosis likely due to positioning and degenerative changes. Skull base and vertebrae: Diffusely decreased bone density. Multilevel severe degenerative changes and fusion of the spine. Multilevel  severe osseous neural foraminal stenosis along the left C2-C3, C3-C4, C4-C5 levels. No severe osseous central canal stenosis. no acute fracture. No aggressive appearing focal osseous lesion or focal pathologic process. Soft tissues and spinal canal: No prevertebral fluid or swelling. No visible canal hematoma. Upper chest: Right apex ground-glass pulmonary micronodule. Other: Persistent (from 07/31/2020) indeterminate lytic appearing lesion of the posterior right second and third ribs (6:50, 56). IMPRESSION: 1. No acute intracranial abnormality. 2. No acute displaced fracture or traumatic listhesis of the cervical spine. 3. Multilevel severe osseous neural foraminal stenosis along the left C2-C3, C3-C4, C4-C5 levels. No severe osseous central canal stenosis. 4. Persistent indeterminate lytic appearing lesion of the posterior right second and third ribs. 5. artially visualized vertex scalp soft tissue density with calcification. Correlate with physical exam. Electronically Signed   By: Iven Finn M.D.   On: 06/03/2021 23:55  ? ?CT Chest Wo Contrast ? ?Result Date: 06/04/2021 ?CLINICAL DATA:  Lung nodule, high cancer risk.  Fall. EXAM: CT CHEST WITHOUT CONTRAST TECHNIQUE: Multidetector CT imaging of the chest was performed following the standard protocol without IV contrast. RADIATION DOSE REDUCTION: This exam was performed according to the departmental dose-optimization program which includes automated exposure control, adjustment of the mA and/or kV according to patient size and/or use of iterative reconstruction technique. COMPARISON:  06/03/2021, 07/31/2020. FINDINGS: Cardiovascular: The heart is borderline enlarged and there is a trace pericardial effusion. Multi-vessel coronary artery calcifications are noted. There is atherosclerotic calcification of the aorta without evidence of aneurysm. The pulmonary trunk is normal in caliber. Mediastinum/Nodes: Nonenlarged lymph nodes are present in the mediastinum.  Evaluation of the hila is limited due to lack of IV contrast. No axillary lymphadenopathy is seen. The right lobe of the thyroid gland and isthmus are enlarged, heterogeneous, and multiple rim calcifications are noted. There is mass effect with leftward shift on the proximal trachea. The esophagus is within normal limits. Lungs/Pleura: Scattered ground-glass and parenchymal opacities are noted in the lungs bilaterally with a basilar predominance. A few of the lesions have a slightly nodular morphology measuring up to 2.6 cm in the right lower lobe. There are small bilateral pleural effusions, greater on the right than on the left. No pneumothorax is seen. Upper Abdomen: Cholecystectomy clips are present in the right upper quadrant. No acute abnormality. Musculoskeletal: Degenerative changes are present in the thoracic spine. Spinal fusion hardware is noted in the mid to lower thoracic spine. No acute osseous abnormality is seen. An old rib fracture with bone callus formation is noted at T11 on the right. Stable lytic lesions are present in the posterior aspect of the T2 and T3 ribs on the right posteriorly. IMPRESSION: 1. Ground-glass and parenchymal opacities in  the lungs bilaterally with a basilar predominance which may be infectious or inflammatory. A few of the areas are nodular in morphology and underlying neoplasm can not be excluded. Short-term follow-up is recommended. 2. Small bilateral pleural effusions, greater on the right than on the left. 3. Aortic atherosclerosis. 4. Coronary artery calcifications. 5. Remaining findings as described above. Electronically Signed   By: Brett Fairy M.D.   On: 06/04/2021 01:14  ? ?CT Cervical Spine Wo Contrast ? ?Result Date: 06/03/2021 ?CLINICAL DATA:  Fall EXAM: CT HEAD WITHOUT CONTRAST CT CERVICAL SPINE WITHOUT CONTRAST TECHNIQUE: Multidetector CT imaging of the head and cervical spine was performed following the standard protocol without intravenous contrast.  Multiplanar CT image reconstructions of the cervical spine were also generated. RADIATION DOSE REDUCTION: This exam was performed according to the departmental dose-optimization program which includes automated exp

## 2021-06-05 NOTE — Progress Notes (Addendum)
?      ?                 PROGRESS NOTE ? ?      ?PATIENT DETAILS ?Name: Jacqueline Richmond ?Age: 76 y.o. ?Sex: female ?Date of Birth: [birth year] ?Admit Date: 06/03/2021 ?Admitting Physician Rhetta Mura, DO ?VOZ:DGUYQI, Shanon Brow, MD ? ?Brief Summary: ?Patient is a 76 y.o.  female with history of CKD stage IV, HTN, DM-2 who presented to the ED following a fall-was found to have AKI and hyperkalemia. ? ? ?Significant events: ?3/13>> admit for fall/AKI/hyperkalemia ? ?Significant studies: ?3/13>> CT head: No acute intracranial abnormality ?3/13>> CT C-spine: No acute displaced fracture or traumatic injury.  Persistent indeterminate appearing lytic lesions of the posterior right second and third ribs. ?3/13>> CXR: No pneumonia-stable small pleural effusion ?3/13>> x-ray pelvis: No displaced fracture. ?3/13>> CT T-spine: No fracture or traumatic listhesis of the thoracic spine.  Multilevel degenerative changes.  Right lower lobe spiculated nodule measuring 2.8 x 1.6 cm.  Persistent indeterminate posterior right second and third rib lesions.  Enlarged/heterogeneous right thyroid gland. ?3/13>> CT chest: Groundglass/parenchymal opacities bilaterally, small bilateral pleural effusions.  Nodular lesions bilaterally-measuring up to 2.6 cm in the right lower lobe. ?3/14>> renal ultrasound: No right hydronephrosis.  Left kidney could not be visualized (obscured by gas) ? ?Significant microbiology data: ?3/14>> influenza/COVID PCR: Negative ? ?Procedures: ?None ? ?Consults: ?None  ? ?Subjective: ?Lying comfortably in bed-denies any chest pain or shortness of breath. ? ?Objective: ?Vitals: ?Blood pressure (!) 126/58, pulse 80, temperature 98.1 ?F (36.7 ?C), temperature source Oral, resp. rate 19, height '5\' 4"'$  (1.626 m), weight 65.8 kg, SpO2 98 %.  ? ?Exam: ?Gen Exam:Alert awake-not in any distress ?HEENT:atraumatic, normocephalic ?Chest: B/L clear to auscultation anteriorly ?CVS:S1S2 regular ?Abdomen:soft non tender, non  distended ?Extremities:no edema ?Neurology: Non focal ?Skin: no rash ? ?Pertinent Labs/Radiology: ?CBC Latest Ref Rng & Units 06/05/2021 06/04/2021 06/04/2021  ?WBC 4.0 - 10.5 K/uL 8.4 - 8.3  ?Hemoglobin 12.0 - 15.0 g/dL 10.3(L) 11.6(L) 10.7(L)  ?Hematocrit 36.0 - 46.0 % 34.1(L) 34.0(L) 35.3(L)  ?Platelets 150 - 400 K/uL 152 - 154  ?  ?Lab Results  ?Component Value Date  ? NA 137 06/05/2021  ? K 5.4 (H) 06/05/2021  ? CL 111 06/05/2021  ? CO2 15 (L) 06/05/2021  ?  ? ? ?Assessment/Plan: ?Hyperkalemia: Due to AKI-although improved-not yet back to normal.  Continue Lokelma x1 more day. ? ?AKI on CKD stage IV: Likely hemodynamically mediated-renal function improving-avoid nephrotoxic agents-nephrology following. ? ?HTN: BP was soft yesterday-now slowly gradually climbing up-continue to hold antihypertensives-and resume when able. ? ?DM-2: CBG stable with SSI-resume oral hypoglycemic agents on discharge. ? ?Recent Labs  ?  06/04/21 ?2108 06/05/21 ?0733 06/05/21 ?0900  ?GLUCAP 77 72 124*  ? ?RLL lung nodule/indeterminate lytic lesions of right second and third rib: Needs outpatient follow-up with the pulmonologist-will send epic message.  Discussed with nephrologist Dr. Lovena Neighbours will look at outpatient records to see if a prior SPEP/UPEP has been done-if not this will need to be ordered given history of CKD. ? ?Bilateral lower extremity wounds: Chronic issue-appreciate wound care input. ? ?Enlarged right thyroid plan: Further work-up deferred to the outpatient setting ? ?BMI: ?Estimated body mass index is 24.89 kg/m? as calculated from the following: ?  Height as of this encounter: '5\' 4"'$  (1.626 m). ?  Weight as of this encounter: 65.8 kg.  ? ?Code status: ?  Code Status: Full Code  ? ?DVT Prophylaxis: ?  heparin injection 5,000 Units Start: 06/04/21 1445 ?SCDs Start: 06/04/21 0317 ?  ?Family Communication: Son-Jason Roberts-[phone] on 3/15 ? ? ?Disposition Plan: ?Status is: Inpatient ?Remains inpatient appropriate  because: Resolving AKI/hyperkalemia-not yet stable for discharge. ?  ?Planned Discharge Destination:Home health ? ? ?Diet: ?Diet Order   ? ?       ?  Diet renal/carb modified with fluid restriction Diet-HS Snack? Nothing; Fluid restriction: 1200 mL Fluid; Room service appropriate? Yes; Fluid consistency: Thin  Diet effective now       ?  ? ?  ?  ? ?  ?  ? ? ?Antimicrobial agents: ?Anti-infectives (From admission, onward)  ? ? None  ? ?  ? ? ? ?MEDICATIONS: ?Scheduled Meds: ? clopidogrel  75 mg Oral Daily  ? heparin injection (subcutaneous)  5,000 Units Subcutaneous Q8H  ? insulin aspart  0-6 Units Subcutaneous TID WC  ? loratadine  10 mg Oral Daily  ? sodium bicarbonate  1,300 mg Oral TID  ? sodium zirconium cyclosilicate  10 g Oral Once  ? ?Continuous Infusions: ? sodium chloride 100 mL/hr at 06/05/21 0559  ? ?PRN Meds:.acetaminophen **OR** acetaminophen ? ? ?I have personally reviewed following labs and imaging studies ? ?LABORATORY DATA: ?CBC: ?Recent Labs  ?Lab 06/03/21 ?2255 06/03/21 ?2300 06/04/21 ?0720 06/04/21 ?1226 06/05/21 ?0448  ?WBC  --  8.4 8.3  --  8.4  ?NEUTROABS  --  6.8 5.8  --   --   ?HGB 15.0 13.2 10.7* 11.6* 10.3*  ?HCT 44.0 43.4 35.3* 34.0* 34.1*  ?MCV  --  82.8 82.1  --  82.8  ?PLT  --  183 154  --  152  ? ? ?Basic Metabolic Panel: ?Recent Labs  ?Lab 06/04/21 ?0307 06/04/21 ?0720 06/04/21 ?1014 06/04/21 ?1157 06/04/21 ?1226 06/04/21 ?1846 06/05/21 ?0448  ?NA 140 138 139  --  141 138 137  ?K 6.7* 6.6* 5.9* 5.9* 5.9* 5.7* 5.4*  ?CL 114* 111 111  --   --  110 111  ?CO2 19* 18* 21*  --   --  19* 15*  ?GLUCOSE 74 76 124*  --   --  114* 70  ?BUN 60* 61* 57*  --   --  56* 51*  ?CREATININE 3.24* 3.44* 3.45*  --   --  3.28* 3.00*  ?CALCIUM 9.1 9.4 9.5  --   --  9.0 8.8*  ?MG  --  1.9  --   --   --   --   --   ?PHOS  --  4.9*  --   --   --   --   --   ? ? ?GFR: ?Estimated Creatinine Clearance: 15.3 mL/min (A) (by C-G formula based on SCr of 3 mg/dL (H)). ? ?Liver Function Tests: ?Recent Labs  ?Lab  06/03/21 ?2301 06/04/21 ?0720  ?AST 17 15  ?ALT 15 12  ?ALKPHOS 179* 124  ?BILITOT 0.3 0.5  ?PROT 9.0* 7.0  ?ALBUMIN 3.5 2.7*  ? ?No results for input(s): LIPASE, AMYLASE in the last 168 hours. ?No results for input(s): AMMONIA in the last 168 hours. ? ?Coagulation Profile: ?No results for input(s): INR, PROTIME in the last 168 hours. ? ?Cardiac Enzymes: ?Recent Labs  ?Lab 06/04/21 ?0720 06/04/21 ?1846  ?CKTOTAL 30* 29*  ? ? ?BNP (last 3 results) ?No results for input(s): PROBNP in the last 8760 hours. ? ?Lipid Profile: ?No results for input(s): CHOL, HDL, LDLCALC, TRIG, CHOLHDL, LDLDIRECT in the last 72 hours. ? ?Thyroid Function Tests: ?No results  for input(s): TSH, T4TOTAL, FREET4, T3FREE, THYROIDAB in the last 72 hours. ? ?Anemia Panel: ?No results for input(s): VITAMINB12, FOLATE, FERRITIN, TIBC, IRON, RETICCTPCT in the last 72 hours. ? ?Urine analysis: ?   ?Component Value Date/Time  ? [place] YELLOW 06/04/2021 0705  ? APPEARANCEUR CLOUDY (A) 06/04/2021 0705  ? LABSPEC 1.012 06/04/2021 0705  ? PHURINE 8.0 06/04/2021 0705  ? [place] NEGATIVE 06/04/2021 0705  ? [place] NEGATIVE 06/04/2021 0705  ? [place] NEGATIVE 06/04/2021 0705  ? [place] NEGATIVE 06/04/2021 0705  ? PROTEINUR 100 (A) 06/04/2021 0705  ? UROBILINOGEN 0.2 08/29/2011 1820  ? NITRITE NEGATIVE 06/04/2021 0705  ? LEUKOCYTESUR TRACE (A) 06/04/2021 0705  ? ? ?Sepsis Labs: ?Lactic Acid, Venous ?   ?Component Value Date/Time  ? LATICACIDVEN 1.1 12/18/2020 1323  ? ? ?MICROBIOLOGY: ?Recent Results (from the past 240 hour(s))  ?Resp Panel by RT-PCR (Flu A&B, Covid) Nasopharyngeal Swab     Status: None  ? Collection Time: 06/04/21 12:22 AM  ? Specimen: Nasopharyngeal Swab; Nasopharyngeal(NP) swabs in vial transport medium  ?Result Value Ref Range Status  ? SARS Coronavirus 2 by RT PCR NEGATIVE NEGATIVE Final  ?  Comment: (NOTE) ?SARS-CoV-2 target nucleic acids are NOT DETECTED. ? ?The SARS-CoV-2 RNA is generally detectable in upper  respiratory ?specimens during the acute phase of infection. The lowest ?concentration of SARS-CoV-2 viral copies this assay can detect is ?138 copies/mL. A negative result does not preclude SARS-Cov-2 ?infection and should not be used as the so

## 2021-06-05 NOTE — Evaluation (Signed)
Physical Therapy Evaluation ?Patient Details ?Name: Jacqueline Richmond ?MRN: [id number] ?DOB: 07/06/1945 ?Today's Date: 06/05/2021 ? ?History of Present Illness ? 76 y.o. female presenting to ED 3/13 s/p mechanical fall off her bed. Reports hitting her head. Denies LOC. Patient admitted with hyperkalemia. Of note imaging (+) for R lower lobe pulmonary nodules, pleural effusion, basilar atelectasis, and parenchymal opacities bilaterally. PMHx significant for MDII, AKI, HTN, HLD, CKD stage IV, gout, diverticulosis, and T10 fracture.  ?Clinical Impression ? Patient admitted with the above. Patient presents with generalized weakness, impaired balance, and decreased activity tolerance. Patient requires modA for sit to stand and minA for step pivot transfer. Patient was living with elderly husband who is unable to physically assist and has PCA (M-F 4hrs/day) for bathing/dressing but not adequate amount of time for current patient's needs. Patient will benefit from skilled PT services during acute stay to address listed deficits. Recommend SNF at discharge to maximize functional mobility to assist with safe return home.  ?   ? ?Recommendations for follow up therapy are one component of a multi-disciplinary discharge planning process, led by the attending physician.  Recommendations may be updated based on patient status, additional functional criteria and insurance authorization. ? ?Follow Up Recommendations Skilled nursing-short term rehab (<3 hours/day) ? ?  ?Assistance Recommended at Discharge Frequent or constant Supervision/Assistance  ?Patient can return home with the following ? A lot of help with walking and/or transfers;A lot of help with bathing/dressing/bathroom;Assistance with cooking/housework;Direct supervision/assist for medications management;Direct supervision/assist for financial management;Assist for transportation;Help with stairs or ramp for entrance ? ?  ?Equipment Recommendations None recommended by PT   ?Recommendations for Other Services ?    ?  ?Functional Status Assessment Patient has had a recent decline in their functional status and demonstrates the ability to make significant improvements in function in a reasonable and predictable amount of time.  ? ?  ?Precautions / Restrictions Precautions ?Precautions: Fall ?Precaution Comments: High fall risk ?Restrictions ?Weight Bearing Restrictions: No  ? ?  ? ?Mobility ? Bed Mobility ?Overal bed mobility: Needs Assistance ?Bed Mobility: Sit to Supine ?  ?  ?  ?Sit to supine: Min assist ?  ?General bed mobility comments: assist to bring LEs back to bed ?  ? ?Transfers ?Overall transfer level: Needs assistance ?Equipment used: Rolling Levaughn Puccinelli (2 wheels) ?Transfers: Sit to/from Stand, Bed to chair/wheelchair/BSC ?Sit to Stand: Mod assist ?  ?Step pivot transfers: Min assist ?  ?  ?  ?General transfer comment: modA to stand from recliner with good hand placement on arms of recliner. Increased time and effort to complete transition from hands on recliner to Rw. Able to take steps towards bed with minA for RW management and balance. ?  ? ?Ambulation/Gait ?  ?  ?  ?  ?  ?  ?  ?General Gait Details: deferred due to fatigue and lack of +2 assist ? ?Stairs ?  ?  ?  ?  ?  ? ?Wheelchair Mobility ?  ? ?Modified Rankin (Stroke Patients Only) ?  ? ?  ? ?Balance Overall balance assessment: Needs assistance ?Sitting-balance support: Single extremity supported, Bilateral upper extremity supported, Feet supported ?Sitting balance-Leahy Scale: Fair ?  ?  ?Standing balance support: Bilateral upper extremity supported, During functional activity ?Standing balance-Leahy Scale: Poor ?Standing balance comment: Reliant on external assist. ?  ?  ?  ?  ?  ?  ?  ?  ?  ?  ?  ?   ? ? ? ?Pertinent Vitals/Pain  Pain Assessment ?Pain Assessment: Faces ?Faces Pain Scale: Hurts little more ?Pain Location: R heel ?Pain Descriptors / Indicators: Aching, Sore ?Pain Intervention(s): Monitored during  session  ? ? ?Home Living Family/patient expects to be discharged to:: Private residence ?Living Arrangements: Spouse/significant other (spouse is elderly and unable to provide physical assist) ?Available Help at Discharge: Family;Personal care attendant ?Type of Home: House ?Home Access: Level entry ?  ?  ?Alternate Level Stairs-Number of Steps: 13 ?Home Layout: Two level;Able to live on main level with bedroom/bathroom ?Home Equipment: Conservation officer, nature (2 wheels) ?Additional Comments: PCA M-F for 4hrs.  ?  ?Prior Function Prior Level of Function : Needs assist ?  ?  ?  ?Physical Assist : Mobility (physical);ADLs (physical) ?  ?  ?Mobility Comments: RW for mobility in household. ?ADLs Comments: Mod I with toilet transfers and toileting. Assist from PCA for bathing and LB dressing. ?  ? ? ?Hand Dominance  ? Dominant Hand: Right ? ?  ?Extremity/Trunk Assessment  ? Upper Extremity Assessment ?Upper Extremity Assessment: Defer to OT evaluation ?  ? ?Lower Extremity Assessment ?Lower Extremity Assessment: Generalized weakness ?  ? ?Cervical / Trunk Assessment ?Cervical / Trunk Assessment: Kyphotic  ?Communication  ? Communication: HOH  ?Cognition Arousal/Alertness: Awake/alert ?Behavior During Therapy: [facility] for tasks assessed/performed ?Overall Cognitive Status: Within Functional Limits for tasks assessed ?  ?  ?  ?  ?  ?  ?  ?  ?  ?  ?  ?  ?  ?  ?  ?  ?  ?  ?  ? ?  ?General Comments General comments (skin integrity, edema, etc.): BLE wrapped. Dry and clean intact ? ?  ?Exercises    ? ?Assessment/Plan  ?  ?PT Assessment Patient needs continued PT services  ?PT Problem List Decreased strength;Decreased balance;Decreased mobility;Decreased activity tolerance;Decreased coordination ? ?   ?  ?PT Treatment Interventions DME instruction;Gait training;Functional mobility training;Therapeutic activities;Therapeutic exercise;Balance training;Patient/family education   ? ?PT Goals (Current goals can be found in the Care Plan  section)  ?Acute Rehab PT Goals ?Patient Stated Goal: to get stronger ?PT Goal Formulation: With patient ?Time For Goal Achievement: 06/19/21 ?Potential to Achieve Goals: Fair ? ?  ?Frequency Min 2X/week ?  ? ? ?Co-evaluation   ?  ?  ?  ?  ? ? ?  ?AM-PAC PT "6 Clicks" Mobility  ?Outcome Measure Help needed turning from your back to your side while in a flat bed without using bedrails?: A Little ?Help needed moving from lying on your back to sitting on the side of a flat bed without using bedrails?: A Lot ?Help needed moving to and from a bed to a chair (including a wheelchair)?: A Lot ?Help needed standing up from a chair using your arms (e.g., wheelchair or bedside chair)?: A Lot ?Help needed to walk in hospital room?: Total ?Help needed climbing 3-5 steps with a railing? : Total ?6 Click Score: 11 ? ?  ?End of Session Equipment Utilized During Treatment: Gait belt ?Activity Tolerance: Patient tolerated treatment well ?Patient left: in bed;with call bell/phone within reach;with bed alarm set ?Nurse Communication: Mobility status ?PT Visit Diagnosis: Unsteadiness on feet (R26.81);Muscle weakness (generalized) (M62.81);Difficulty in walking, not elsewhere classified (R26.2);History of falling (Z91.81) ?  ? ?Time: 1352-1406 ?PT Time Calculation (min) (ACUTE ONLY): 14 min ? ? ?Charges:   PT Evaluation ?$PT Eval Moderate Complexity: 1 Mod ?  ?  ?   ? ? ?Amarria Andreasen A. Gilford Rile, PT, DPT ?Acute Rehabilitation Services ?Pager  [phone] ?Office [phone] ? ? ?Caelum Federici A Schawn Byas ?06/05/2021, 2:38 PM ? ?

## 2021-06-05 NOTE — Plan of Care (Signed)

## 2021-06-06 DIAGNOSIS — E875 Hyperkalemia: Secondary | ICD-10-CM | POA: Diagnosis not present

## 2021-06-06 LAB — RENAL FUNCTION PANEL
Albumin: 2.5 g/dL — ABNORMAL LOW (ref 3.5–5.0)
Anion gap: 13 (ref 5–15)
BUN: 38 mg/dL — ABNORMAL HIGH (ref 8–23)
CO2: 13 mmol/L — ABNORMAL LOW (ref 22–32)
Calcium: 8.9 mg/dL (ref 8.9–10.3)
Chloride: 111 mmol/L (ref 98–111)
Creatinine, Ser: 2.63 mg/dL — ABNORMAL HIGH (ref 0.44–1.00)
GFR, Estimated: 19 mL/min — ABNORMAL LOW (ref >60)
Glucose, Bld: 66 mg/dL — ABNORMAL LOW (ref 70–99)
Phosphorus: 4 mg/dL (ref 2.5–4.6)
Potassium: 4 mmol/L (ref 3.5–5.1)
Sodium: 137 mmol/L (ref 135–145)

## 2021-06-06 LAB — GLUCOSE, CAPILLARY
Glucose-Capillary: 60 mg/dL — ABNORMAL LOW (ref 70–99)
Glucose-Capillary: 89 mg/dL (ref 70–99)
Glucose-Capillary: 131 mg/dL — ABNORMAL HIGH (ref 70–99)
Glucose-Capillary: 105 mg/dL — ABNORMAL HIGH (ref 70–99)

## 2021-06-06 NOTE — Progress Notes (Addendum)
?[facility] ?Progress Note  ? ?Subjective:   Feeling ok, eating, working with OT currently.   ?I/Os yest 120 / 1250. ?Cr improved to 2.6 this AM, K normal.  ? ?Objective ?Vitals:  ? 06/06/21 0006 06/06/21 0359 06/06/21 0814 06/06/21 1100  ?BP: (!) 147/66 (!) 155/69 (!) 135/58 (!) 155/65  ?Pulse: 83 89 87 86  ?Resp: '20 20 20 20  '$ ?Temp: 98 ?F (36.7 ?C) 98 ?F (36.7 ?C) 98.1 ?F (36.7 ?C) 98.4 ?F (36.9 ?C)  ?TempSrc: Oral Oral Oral Oral  ?SpO2: 97% 97% 95% 97%  ?Weight:      ?Height:      ? ?Physical Exam ?General:  awake and comfortable ?ENT: MMM ?Heart: RRR ?Lungs: clear ant ?Abdomen: soft ?Extremities:  no edema ?GU: foley draining clear yellow urine ? ?Additional Objective ?Labs: ?Basic Metabolic Panel: ?Recent Labs  ?Lab 06/04/21 ?0720 06/04/21 ?1014 06/04/21 ?1846 06/05/21 ?0448 06/06/21 ?0416  ?NA 138   < > 138 137 137  ?K 6.6*   < > 5.7* 5.4* 4.0  ?CL 111   < > 110 111 111  ?CO2 18*   < > 19* 15* 13*  ?GLUCOSE 76   < > 114* 70 66*  ?BUN 61*   < > 56* 51* 38*  ?CREATININE 3.44*   < > 3.28* 3.00* 2.63*  ?CALCIUM 9.4   < > 9.0 8.8* 8.9  ?PHOS 4.9*  --   --   --  4.0  ? < > = values in this interval not displayed.  ? ? ?Liver Function Tests: ?Recent Labs  ?Lab 06/03/21 ?2301 06/04/21 ?0720 06/06/21 ?0416  ?AST 17 15  --   ?ALT 15 12  --   ?ALKPHOS 179* 124  --   ?BILITOT 0.3 0.5  --   ?PROT 9.0* 7.0  --   ?ALBUMIN 3.5 2.7* 2.5*  ? ? ?No results for input(s): LIPASE, AMYLASE in the last 168 hours. ?CBC: ?Recent Labs  ?Lab 06/03/21 ?2300 06/04/21 ?0720 06/04/21 ?1226 06/05/21 ?0448  ?WBC 8.4 8.3  --  8.4  ?NEUTROABS 6.8 5.8  --   --   ?HGB 13.2 10.7* 11.6* 10.3*  ?HCT 43.4 35.3* 34.0* 34.1*  ?MCV 82.8 82.1  --  82.8  ?PLT 183 154  --  152  ? ? ?Blood Culture ?   ?Component Value Date/Time  ? [place], CLEAN CATCH 12/21/2020 1157  ? [place] NONE 12/21/2020 1157  ? CULT  12/21/2020 1157  ?  NO GROWTH ?Performed at [facility], [place] [address] [place], [place] [address] ?  ? REPTSTATUS  12/22/2020 FINAL 12/21/2020 1157  ? ? ?Cardiac Enzymes: ?Recent Labs  ?Lab 06/04/21 ?0720 06/04/21 ?1846  ?CKTOTAL 30* 29*  ? ? ?CBG: ?Recent Labs  ?Lab 06/05/21 ?0900 06/05/21 ?1310 06/05/21 ?1649 06/05/21 ?2035 06/06/21 ?6283  ?GLUCAP 124* 85 112* 84 60*  ? ? ?Iron Studies: No results for input(s): IRON, TIBC, TRANSFERRIN, FERRITIN in the last 72 hours. ?'@lablastinr3'$ @ ?Studies/Results: ?No results found. ?Medications: ? sodium chloride 10 mL/hr at 06/06/21 1136  ? ? Chlorhexidine Gluconate Cloth  6 each Topical Daily  ? clopidogrel  75 mg Oral Daily  ? heparin injection (subcutaneous)  5,000 Units Subcutaneous Q8H  ? insulin aspart  0-6 Units Subcutaneous TID WC  ? loratadine  10 mg Oral Daily  ? sodium bicarbonate  1,300 mg Oral TID  ? ? ?Assessment/Plan: ?**Hyperkalemia:  K was 7.6 on presentation but improving with medical management to 5.4 this AM.  Secondary to AKI + possible KCl ingestion.  I also see she was recently started on kerendia.  CK normal.  Has great UOP and improving AKI and K has now normalized off lokelma.  ? ?**AKI on CKD 4, nonoliguric:  recent Cr in the 2.'0mg'$ /dL range, has known CKD followed by Dr. Cira Servant - h/o renal biopsy with DM.  Presented with AKI Cr 4 on presentation 3/13 in the setting of diarrhea with suspected prerenal insult +/- hypotension with tubular injury. UA 3/13 not consistent with GN.  Renal US with retention and foley placed 3/14.  Thankfully improving with creatinine down to 2.6.  D/C IVF now.  Avoid nephrotoxins.  Avoid hypotension, maintain euvolemia.  ? ?**Metabolic acidosis: worse in setting of NS volume expansion, cont TID oral bicarb.  ? ?**HTN:  BPs were low on admission; holding home meds.  Normotensive today.  ? ?**Diarrhea:  improving. Per Primary.  ? ?**RLL nodule: her outpt pulmonologist will follow ? ?**lytic lesions:  noted on CT this admission "stable" from prior but I can't tell which prior study.  Ca ok.  Check SPEP, UPEP, SLFC r/o myeloma.  May need  additional w/u.  ? ?If creatinine stable or improved tomorrow and she's tolerating oral intake would be ok for discharge from my perspective.  Nothing further to add.  Will arrange f/u in nephrology clinic 4 weeks with Dr. Posey Pronto, [place] office will contact her.  No kerendia at DC.  Would continue oral bicarb at DC 1300 BID for now.  ?Will need TOV or d/c with foley + urology f/u - unclear to me why urinary retention.  ? ?Jannifer Hick MD ?06/06/2021, 12:11 PM  ?[facility] ?Pager: [phone] ? ? ?

## 2021-06-06 NOTE — Progress Notes (Signed)
Urine accidentally flushed down toliet at this time. 24 hour urine restarted at this time. Sign placed on toliet, dry erase board, and door reflecting time started and ending time. Day shift nurse notified. ?

## 2021-06-06 NOTE — Progress Notes (Signed)
Occupational Therapy Treatment ?Patient Details ?Name: Jacqueline Richmond ?MRN: [id number] ?DOB: 03-Aug-1945 ?Today's Date: 06/06/2021 ? ? ?History of present illness 76 y.o. female presenting to ED 3/13 s/p mechanical fall off her bed. Reports hitting her head. Denies LOC. Patient admitted with hyperkalemia. Of note imaging (+) for R lower lobe pulmonary nodules, pleural effusion, basilar atelectasis, and parenchymal opacities bilaterally. PMHx significant for MDII, AKI, HTN, HLD, CKD stage IV, gout, diverticulosis, and T10 fracture. ?  ?OT comments ? Pt in bed to start session, not aware that she was laying in BM.  Therapist and NT assisted with cleaning her up in bed rolling side to side with total assist.  She needed mod assist for rolling as well as for transitioning to sitting once she was clean.  She also continues to need at least mod assist for transfers to the bedside recliner/3:1.  Feel she continues to be limited and will benefit from acute OT at this time in order to progress toward min assist level goals.  Feel she will need follow-up SNF at discharge as she lives with her spouse who cannot provide physical assist.     ? ?Recommendations for follow up therapy are one component of a multi-disciplinary discharge planning process, led by the attending physician.  Recommendations may be updated based on patient status, additional functional criteria and insurance authorization. ?   ?Follow Up Recommendations ? Skilled nursing-short term rehab (<3 hours/day)  ?  ?Assistance Recommended at Discharge Frequent or constant Supervision/Assistance  ?Patient can return home with the following ? A lot of help with walking and/or transfers;A lot of help with bathing/dressing/bathroom;Assistance with cooking/housework ?  ?Equipment Recommendations ? None recommended by OT  ?  ?Recommendations for Other Services   ? ?  ?Precautions / Restrictions Precautions ?Precautions: Fall ?Restrictions ?Weight Bearing Restrictions: No   ? ? ?  ? ?Mobility Bed Mobility ?Overal bed mobility: Needs Assistance ?Bed Mobility: Rolling, Supine to Sit ?Rolling: Mod assist ?  ?Supine to sit: Mod assist ?  ?  ?  ?  ? ?Transfers ?Overall transfer level: Needs assistance ?Equipment used: Rolling walker (2 wheels) ?Transfers: Sit to/from Stand, Bed to chair/wheelchair/BSC ?Sit to Stand: Mod assist ?Stand pivot transfers: Mod assist ?  ?Step pivot transfers: Mod assist ?  ?  ?General transfer comment: Pt needed mod instructional cueing for hand placement during sit to stand.  Increased flexed posture noted in standing with increased fear of falling. ?  ?  ?Balance Overall balance assessment: Needs assistance ?  ?Sitting balance-Leahy Scale: Fair ?  ?  ?Standing balance support: Bilateral upper extremity supported, During functional activity ?Standing balance-Leahy Scale: Poor ?  ?  ?  ?  ?  ?  ?  ?  ?  ?  ?  ?  ?   ? ?ADL either performed or assessed with clinical judgement  ? ?ADL Overall ADL's : Needs assistance/impaired ?  ?  ?Grooming: Set up;Sitting ?Grooming Details (indicate cue type and reason): Washed face and hands sitting EOC. ?  ?  ?  ?  ?  ?  ?  ?  ?  ?  ?Toileting- Clothing Manipulation and Hygiene: Maximal assistance ?Toileting - Clothing Manipulation Details (indicate cue type and reason): supine rolling secondary to bowel incontinence ?  ?  ?Functional mobility during ADLs: Moderate assistance;Rolling walker (2 wheels) ?General ADL Comments: Pt needed mod assist with max instructional cueing for sequencing rolling in the bed for clean up of bowel movement with mod assist  to transfer to the EOB.  Pt without awarness of having any BM and then declined needing to transfer to the [facility] to go any more. ?  ? ? ?   ?   ?   ?  ? ?Cognition Arousal/Alertness: Awake/alert ?Behavior During Therapy: [facility] for tasks assessed/performed ?Overall Cognitive Status: Impaired/Different from baseline ?Area of Impairment: Awareness ?  ?  ?  ?  ?  ?  ?  ?  ?  ?  ?  ?  ?   ?Awareness: Emergent ?  ?General Comments: Pt able to follow commands consistently but was unaware that she was sitting in a BM when therapist pulled back her covers. ?  ?  ?   ?   ?   ?General Comments    ? ? ?Pertinent Vitals/ Pain       Pain Assessment ?Pain Assessment: Faces ?Faces Pain Scale: No hurt ? ?   ?   ? ?Frequency ? Min 2X/week  ? ? ? ? ?  ?Progress Toward Goals ? ?OT Goals(current goals can now be found in the care plan section) ? Progress towards OT goals: Progressing toward goals ? ?Acute Rehab OT Goals ?Patient Stated Goal: Pt wanted to get OOB for lunch. ?Time For Goal Achievement: 06/19/21 ?Potential to Achieve Goals: Good  ?Plan Discharge plan remains appropriate   ? ?AM-PAC OT "6 Clicks" Daily Activity     ?Outcome Measure ? ? Help from another person eating meals?: None ?Help from another person taking care of personal grooming?: A Little ?Help from another person toileting, which includes using toliet, bedpan, or urinal?: A Lot ?Help from another person bathing (including washing, rinsing, drying)?: A Lot ?Help from another person to put on and taking off regular upper body clothing?: A Little ?Help from another person to put on and taking off regular lower body clothing?: A Lot ?6 Click Score: 16 ? ?  ?End of Session   ? ?OT Visit Diagnosis: Unsteadiness on feet (R26.81);Muscle weakness (generalized) (M62.81);History of falling (Z91.81);Other symptoms and signs involving cognitive function ?  ?Activity Tolerance Patient tolerated treatment well ?  ?Patient Left in chair;with call bell/phone within reach;with chair alarm set ?  ?Nurse Communication Mobility status ?  ? ?   ? ?Time: 3149-7026 ?OT Time Calculation (min): 33 min ? ?Charges: OT General Charges ?$OT Visit: 1 Visit ?OT Treatments ?$Self Care/Home Management : 23-37 mins ? ?Gabrianna Fassnacht OTR/L ?06/06/2021, 1:28 PM ?

## 2021-06-06 NOTE — Progress Notes (Signed)
?      ?                 PROGRESS NOTE ? ?      ?PATIENT DETAILS ?Name: Jacqueline Richmond ?Age: 76 y.o. ?Sex: female ?Date of Birth: 05-Feb-1946 ?Admit Date: 06/03/2021 ?Admitting Physician Rhetta Mura, DO ?SEG:BTDVVO, Shanon Brow, MD ? ?Brief Summary: ?Patient is a 76 y.o.  female with history of CKD stage IV, HTN, DM-2 who presented to the ED following a fall-was found to have AKI and hyperkalemia. ? ?Significant events: ?3/13>> admit for fall/AKI/hyperkalemia ? ?Significant studies: ?3/13>> CT head: No acute intracranial abnormality ?3/13>> CT C-spine: No acute displaced fracture or traumatic injury.  Persistent indeterminate appearing lytic lesions of the posterior right second and third ribs. ?3/13>> CXR: No pneumonia-stable small pleural effusion ?3/13>> x-ray pelvis: No displaced fracture. ?3/13>> CT T-spine: No fracture or traumatic listhesis of the thoracic spine.  Multilevel degenerative changes.  Right lower lobe spiculated nodule measuring 2.8 x 1.6 cm.  Persistent indeterminate posterior right second and third rib lesions.  Enlarged/heterogeneous right thyroid gland. ?3/13>> CT chest: Groundglass/parenchymal opacities bilaterally, small bilateral pleural effusions.  Nodular lesions bilaterally-measuring up to 2.6 cm in the right lower lobe. ?3/14>> renal ultrasound: No right hydronephrosis.  Left kidney could not be visualized (obscured by gas) ? ?Significant microbiology data: ?3/14>> influenza/COVID PCR: Negative ? ?Procedures: ?None ? ?Consults: ?None  ? ?Subjective: ?Improving-no major issues overnight.  Denies any chest pain or shortness of breath. ? ?Objective: ?Vitals: ?Blood pressure (!) 135/58, pulse 87, temperature 98.1 ?F (36.7 ?C), temperature source Oral, resp. rate 20, height _0  (1.626 m), weight 65.8 kg, SpO2 95 %.  ? ?Exam: ?Gen Exam:Alert awake-not in any distress ?HEENT:atraumatic, normocephalic ?Chest: B/L clear to auscultation anteriorly ?CVS:S1S2 regular ?Abdomen:soft non tender, non  distended ?Extremities:no edema ?Neurology: Non focal ?Skin: no rash  ? ?Pertinent Labs/Radiology: ?CBC Latest Ref Rng & Units 06/05/2021 06/04/2021 06/04/2021  ?WBC 4.0 - 10.5 K/uL 8.4 - 8.3  ?Hemoglobin 12.0 - 15.0 g/dL 10.3(L) 11.6(L) 10.7(L)  ?Hematocrit 36.0 - 46.0 % 34.1(L) 34.0(L) 35.3(L)  ?Platelets 150 - 400 K/uL 152 - 154  ?  ?Lab Results  ?Component Value Date  ? NA 137 06/06/2021  ? K 4.0 06/06/2021  ? CL 111 06/06/2021  ? CO2 13 (L) 06/06/2021  ?  ? ? ?Assessment/Plan: ?Hyperkalemia: Due to AKI-resolved-no longer on Lokelma. ? ?AKI on CKD stage IV: AKI likely hemodynamically mediated-renal function continues to improve-Foley catheter in place-suspect can remove after 24 urine collection has been completed.   ? ?Lytic lesions seen on right second and third ribs on CT chest: Incidental finding-but given the fact that she has CKD-work-up for multiple myeloma-in progress.  SPEP/urine 24-hour collection for electrophoresis/light chains in progress. ? ?HTN: BP slowly creeping up-KVO IVF-follow closely-and resume antihypertensive if BP still elevated.   ? ?DM-2: CBG stable with SSI-resume oral hypoglycemic agents on discharge. ? ?Recent Labs  ?  06/05/21 ?1310 06/05/21 ?1649 06/05/21 ?2035  ?GLUCAP 85 112* 84  ? ? ?RLL lung nodule: Discussed with primary pulmonologist-Dr. Hunsucker-he will ensure outpatient follow-up ? ?PAD s/p stent in the right superficial femoral and popliteal artery on 02/12/2021: On Plavix ? ?Bilateral lower extremity wounds: Chronic issue-appreciate wound care input. ? ?Enlarged right thyroid plan: Further work-up deferred to the outpatient setting ? ?Debility/deconditioning/functional quadriplegia: Due to acute illness-evaluated by PT/OT-with recommendations for SNF.  Social worker following. ? ?BMI: ?Estimated body mass index is 24.89 kg/m? as calculated from  the following: ?  Height as of this encounter: _0  (1.626 m). ?  Weight as of this encounter: 65.8 kg.  ? ?Code status: ?  Code  Status: Full Code  ? ?DVT Prophylaxis: ?heparin injection 5,000 Units Start: 06/04/21 1445 ?SCDs Start: 06/04/21 0317 ?  ?Family Communication: Son-Jason Roberts-[phone]-left VM on 3/16 ? ? ?Disposition Plan: ?Status is: Inpatient ?Remains inpatient appropriate because: Resolving AKI/hyperkalemia-not yet stable for discharge. ?  ?Planned Discharge Destination:Home health ? ? ?Diet: ?Diet Order   ? ?       ?  Diet renal/carb modified with fluid restriction Diet-HS Snack? Nothing; Fluid restriction: 1200 mL Fluid; Room service appropriate? Yes; Fluid consistency: Thin  Diet effective now       ?  ? ?  ?  ? ?  ?  ? ? ?Antimicrobial agents: ?Anti-infectives (From admission, onward)  ? ? None  ? ?  ? ? ? ?MEDICATIONS: ?Scheduled Meds: ? Chlorhexidine Gluconate Cloth  6 each Topical Daily  ? clopidogrel  75 mg Oral Daily  ? heparin injection (subcutaneous)  5,000 Units Subcutaneous Q8H  ? insulin aspart  0-6 Units Subcutaneous TID WC  ? loratadine  10 mg Oral Daily  ? sodium bicarbonate  1,300 mg Oral TID  ? ?Continuous Infusions: ? sodium chloride 100 mL/hr at 06/06/21 0404  ? ?PRN Meds:.acetaminophen **OR** acetaminophen ? ? ?I have personally reviewed following labs and imaging studies ? ?LABORATORY DATA: ?CBC: ?Recent Labs  ?Lab 06/03/21 ?2255 06/03/21 ?2300 06/04/21 ?0720 06/04/21 ?1226 06/05/21 ?0448  ?WBC  --  8.4 8.3  --  8.4  ?NEUTROABS  --  6.8 5.8  --   --   ?HGB 15.0 13.2 10.7* 11.6* 10.3*  ?HCT 44.0 43.4 35.3* 34.0* 34.1*  ?MCV  --  82.8 82.1  --  82.8  ?PLT  --  183 154  --  152  ? ? ? ?Basic Metabolic Panel: ?Recent Labs  ?Lab 06/04/21 ?0720 06/04/21 ?1014 06/04/21 ?1157 06/04/21 ?1226 06/04/21 ?1846 06/05/21 ?0448 06/06/21 ?0416  ?NA 138 139  --  141 138 137 137  ?K 6.6* 5.9* 5.9* 5.9* 5.7* 5.4* 4.0  ?CL 111 111  --   --  110 111 111  ?CO2 18* 21*  --   --  19* 15* 13*  ?GLUCOSE 76 124*  --   --  114* 70 66*  ?BUN 61* 57*  --   --  56* 51* 38*  ?CREATININE 3.44* 3.45*  --   --  3.28* 3.00* 2.63*   ?CALCIUM 9.4 9.5  --   --  9.0 8.8* 8.9  ?MG 1.9  --   --   --   --   --   --   ?PHOS 4.9*  --   --   --   --   --  4.0  ? ? ? ?GFR: ?Estimated Creatinine Clearance: 17.5 mL/min (A) (by C-G formula based on SCr of 2.63 mg/dL (H)). ? ?Liver Function Tests: ?Recent Labs  ?Lab 06/03/21 ?2301 06/04/21 ?0720 06/06/21 ?0416  ?AST 17 15  --   ?ALT 15 12  --   ?ALKPHOS 179* 124  --   ?BILITOT 0.3 0.5  --   ?PROT 9.0* 7.0  --   ?ALBUMIN 3.5 2.7* 2.5*  ? ? ?No results for input(s): LIPASE, AMYLASE in the last 168 hours. ?No results for input(s): AMMONIA in the last 168 hours. ? ?Coagulation Profile: ?No results for input(s): INR, PROTIME in the last 168 hours. ? ?  Cardiac Enzymes: ?Recent Labs  ?Lab 06/04/21 ?0720 06/04/21 ?1846  ?CKTOTAL 30* 29*  ? ? ? ?BNP (last 3 results) ?No results for input(s): PROBNP in the last 8760 hours. ? ?Lipid Profile: ?No results for input(s): CHOL, HDL, LDLCALC, TRIG, CHOLHDL, LDLDIRECT in the last 72 hours. ? ?Thyroid Function Tests: ?No results for input(s): TSH, T4TOTAL, FREET4, T3FREE, THYROIDAB in the last 72 hours. ? ?Anemia Panel: ?No results for input(s): VITAMINB12, FOLATE, FERRITIN, TIBC, IRON, RETICCTPCT in the last 72 hours. ? ?Urine analysis: ?   ?Component Value Date/Time  ? [place] YELLOW 06/04/2021 0705  ? APPEARANCEUR CLOUDY (A) 06/04/2021 0705  ? LABSPEC 1.012 06/04/2021 0705  ? PHURINE 8.0 06/04/2021 0705  ? [place] NEGATIVE 06/04/2021 0705  ? [place] NEGATIVE 06/04/2021 0705  ? [place] NEGATIVE 06/04/2021 0705  ? [place] NEGATIVE 06/04/2021 0705  ? PROTEINUR 100 (A) 06/04/2021 0705  ? UROBILINOGEN 0.2 08/29/2011 1820  ? NITRITE NEGATIVE 06/04/2021 0705  ? LEUKOCYTESUR TRACE (A) 06/04/2021 0705  ? ? ?Sepsis Labs: ?Lactic Acid, Venous ?   ?Component Value Date/Time  ? LATICACIDVEN 1.1 12/18/2020 1323  ? ? ?MICROBIOLOGY: ?Recent Results (from the past 240 hour(s))  ?Resp Panel by RT-PCR (Flu A&B, Covid) Nasopharyngeal Swab     Status: None  ? Collection Time: 06/04/21  12:22 AM  ? Specimen: Nasopharyngeal Swab; Nasopharyngeal(NP) swabs in vial transport medium  ?Result Value Ref Range Status  ? SARS Coronavirus 2 by RT PCR NEGATIVE NEGATIVE Final  ?  Comment: (NOTE) ?SARS-Co

## 2021-06-06 NOTE — TOC Progression Note (Addendum)
Transition of Care (TOC) - Progression Note  ? ? ?Patient Details  ?Name: Jacqueline Richmond ?MRN: [id number] ?Date of Birth: 22-Mar-1946 ? ?Transition of Care (TOC) CM/SW Contact  ?Coralee Pesa, LCSWA ?Phone Number: ?06/06/2021, 1:28 PM ? ?Clinical Narrative:    ?4:30 CSW spoke with family who requested [facility]. CSW spoke with Narda Rutherford at [facility] they can accept when ready. Auth will need to be started with [employer] when closer to DC. ? ?CSW spoke with Cherie with [facility]. She noted that pt is in network and they would be agreeable to her coming back, however, she owes $5000 that will need to be paid up front. CSW spoke with family who are going to work on this, as they would like her to go back to [place] as well. Other bed offers were given, son and daughter in law will discuss with pt and update CSW. TOC will continue to follow for DC needs. ? ? ?Expected Discharge Plan: [place] ?Barriers to Discharge: Continued Medical Work up, SNF Pending bed offer ? ?Expected Discharge Plan and Services ?Expected Discharge Plan: [place] ?In-house Referral: Clinical Social Work ?  ?Post Acute Care Choice: [place] ?Living arrangements for the past 2 months: [place] ?                ?  ?  ?  ?  ?  ?  ?  ?  ?  ?  ? ? ?Social Determinants of Health (SDOH) Interventions ?  ? ?Readmission Risk Interventions ?No flowsheet data found. ? ?

## 2021-06-07 ENCOUNTER — Other Ambulatory Visit: Payer: Self-pay

## 2021-06-07 DIAGNOSIS — E875 Hyperkalemia: Secondary | ICD-10-CM | POA: Diagnosis not present

## 2021-06-07 DIAGNOSIS — R911 Solitary pulmonary nodule: Secondary | ICD-10-CM

## 2021-06-07 DIAGNOSIS — E119 Type 2 diabetes mellitus without complications: Secondary | ICD-10-CM | POA: Diagnosis not present

## 2021-06-07 DIAGNOSIS — N179 Acute kidney failure, unspecified: Secondary | ICD-10-CM | POA: Diagnosis not present

## 2021-06-07 LAB — PTH, INTACT AND CALCIUM
Calcium, Total (PTH): 8.9 mg/dL (ref 8.7–10.3)
PTH: 20 pg/mL (ref 15–65)

## 2021-06-07 LAB — RENAL FUNCTION PANEL
Albumin: 2.4 g/dL — ABNORMAL LOW (ref 3.5–5.0)
Anion gap: 10 (ref 5–15)
BUN: 32 mg/dL — ABNORMAL HIGH (ref 8–23)
CO2: 17 mmol/L — ABNORMAL LOW (ref 22–32)
Calcium: 9.1 mg/dL (ref 8.9–10.3)
Chloride: 112 mmol/L — ABNORMAL HIGH (ref 98–111)
Creatinine, Ser: 2.16 mg/dL — ABNORMAL HIGH (ref 0.44–1.00)
GFR, Estimated: 23 mL/min — ABNORMAL LOW (ref >60)
Glucose, Bld: 87 mg/dL (ref 70–99)
Phosphorus: 3.5 mg/dL (ref 2.5–4.6)
Potassium: 3.8 mmol/L (ref 3.5–5.1)
Sodium: 139 mmol/L (ref 135–145)

## 2021-06-07 LAB — KAPPA/LAMBDA LIGHT CHAINS
Kappa free light chain: 119.7 mg/L — ABNORMAL HIGH (ref 3.3–19.4)
Kappa, lambda light chain ratio: 2.11 — ABNORMAL HIGH (ref 0.26–1.65)
Lambda free light chains: 56.6 mg/L — ABNORMAL HIGH (ref 5.7–26.3)

## 2021-06-07 LAB — GLUCOSE, CAPILLARY
Glucose-Capillary: 93 mg/dL (ref 70–99)
Glucose-Capillary: 100 mg/dL — ABNORMAL HIGH (ref 70–99)
Glucose-Capillary: 87 mg/dL (ref 70–99)

## 2021-06-07 MED ORDER — INSULIN ASPART 100 UNIT/ML IJ SOLN
INTRAMUSCULAR | 11 refills | Status: AC
Start: 2021-06-07 — End: ?

## 2021-06-07 MED ORDER — ATENOLOL 50 MG PO TABS
50.0000 mg | ORAL_TABLET | Freq: Two times a day (BID) | ORAL | Status: DC
Start: 2021-06-07 — End: 2021-06-08
  Administered 2021-06-07: 50 mg via ORAL
  Filled 2021-06-07: qty 1

## 2021-06-07 MED ORDER — SODIUM BICARBONATE 650 MG PO TABS: 1300.0000 mg | ORAL_TABLET | Freq: Two times a day (BID) | ORAL | Status: AC

## 2021-06-07 NOTE — Progress Notes (Signed)
Physical Therapy Treatment ?Patient Details ?Name: Jacqueline Richmond ?MRN: [id number] ?DOB: 03/20/1946 ?Today's Date: 06/07/2021 ? ? ?History of Present Illness 76 y.o. female presenting to ED 3/13 s/p mechanical fall off her bed. Reports hitting her head. Denies LOC. Patient admitted with hyperkalemia. Of note imaging (+) for R lower lobe pulmonary nodules, pleural effusion, basilar atelectasis, and parenchymal opacities bilaterally. PMHx significant for MDII, AKI, HTN, HLD, CKD stage IV, gout, diverticulosis, and T10 fracture. ? ?  ?PT Comments  ? ? Patient received in bed, reports she had BM in bed. Said she called and had been waiting for bed pan but no one has come. Patient required mod +2 assist for rolling in bed for cleaning. Painful when rolling to her left side, reporting shoulder pain. She declined attempting to sit at edge of bed or get to recliner after cleaning due to fatigue. Patient performed LE strengthening exercises in bed wit cues. She will continue to benefit from skilled PT while here to improve strength, mobility and safety.  ?        ?Recommendations for follow up therapy are one component of a multi-disciplinary discharge planning process, led by the attending physician.  Recommendations may be updated based on patient status, additional functional criteria and insurance authorization. ? ?Follow Up Recommendations ? Skilled nursing-short term rehab (<3 hours/day) ?  ?  ?Assistance Recommended at Discharge    ?Patient can return home with the following A lot of help with walking and/or transfers;A lot of help with bathing/dressing/bathroom;Assistance with cooking/housework;Direct supervision/assist for medications management;Direct supervision/assist for financial management;Assist for transportation;Help with stairs or ramp for entrance ?  ?Equipment Recommendations ? None recommended by PT  ?  ?Recommendations for Other Services   ? ? ?  ?Precautions / Restrictions Precautions ?Precautions:  Fall ?Precaution Comments: High fall risk ?Restrictions ?Weight Bearing Restrictions: No  ?  ? ?Mobility ? Bed Mobility ?Overal bed mobility: Needs Assistance ?Bed Mobility: Rolling ?Rolling: Mod assist, +2 for physical assistance ?  ?  ?  ?  ?General bed mobility comments: patient declined further mobility after rolling to get cleaned up. Reported L shoulder pain when rolled onto that side. ?  ? ?Transfers ?  ?  ?  ?  ?  ?  ?  ?  ?  ?General transfer comment: patient declined ?  ? ?Ambulation/Gait ?  ?  ?  ?  ?  ?  ?  ?  ? ? ?Stairs ?  ?  ?  ?  ?  ? ? ?Wheelchair Mobility ?  ? ?Modified Rankin (Stroke Patients Only) ?  ? ? ?  ?Balance   ?  ?  ?  ?  ?  ?  ?  ?  ?  ?  ?  ?  ?  ?  ?  ?  ?  ?  ?  ? ?  ?Cognition Arousal/Alertness: Awake/alert ?Behavior During Therapy: [facility] for tasks assessed/performed ?Overall Cognitive Status: Within Functional Limits for tasks assessed ?  ?  ?  ?  ?  ?  ?  ?  ?  ?  ?  ?  ?  ?  ?  ?  ?  ?  ?  ? ?  ?Exercises Other Exercises ?Other Exercises: B LE exercises: SLR, hip abd/add, heel slides x 10 reps each. Good ability. ? ?  ?General Comments   ?  ?  ? ?Pertinent Vitals/Pain Pain Assessment ?Pain Assessment: Faces ?Faces Pain Scale: Hurts even more ?Pain Location: L  shoulder ?Pain Descriptors / Indicators: Discomfort, Grimacing, Guarding, Moaning ?Pain Intervention(s): Monitored during session, Repositioned  ? ? ?Home Living   ?  ?  ?  ?  ?  ?  ?  ?  ?  ?   ?  ?Prior Function    ?  ?  ?   ? ?PT Goals (current goals can now be found in the care plan section) Acute Rehab PT Goals ?Patient Stated Goal: to get stronger ?PT Goal Formulation: With patient ?Time For Goal Achievement: 06/19/21 ?Potential to Achieve Goals: Fair ?Progress towards PT goals: Not progressing toward goals - comment ? ?  ?Frequency ? ? ? Min 2X/week ? ? ? ?  ?PT Plan Current plan remains appropriate  ? ? ?Co-evaluation   ?  ?  ?  ?  ? ?  ?AM-PAC PT "6 Clicks" Mobility   ?Outcome Measure ? Help needed turning from your  back to your side while in a flat bed without using bedrails?: A Lot ?Help needed moving from lying on your back to sitting on the side of a flat bed without using bedrails?: A Lot ?Help needed moving to and from a bed to a chair (including a wheelchair)?: A Lot ?Help needed standing up from a chair using your arms (e.g., wheelchair or bedside chair)?: A Lot ?Help needed to walk in hospital room?: Total ?Help needed climbing 3-5 steps with a railing? : Total ?6 Click Score: 10 ? ?  ?End of Session   ?Activity Tolerance: Patient limited by fatigue ?Patient left: in bed;with call bell/phone within reach;with bed alarm set ?Nurse Communication: Mobility status ?PT Visit Diagnosis: Unsteadiness on feet (R26.81);Muscle weakness (generalized) (M62.81);Difficulty in walking, not elsewhere classified (R26.2);History of falling (Z91.81) ?  ? ? ?Time: 3016-0109 ?PT Time Calculation (min) (ACUTE ONLY): 20 min ? ?Charges:  $Therapeutic Exercise: 8-22 mins          ?          ? ?Amanda Cockayne, PT, GCS ?06/07/21,11:01 AM ? ? ?

## 2021-06-07 NOTE — TOC Transition Note (Signed)
Transition of Care (TOC) - CM/SW Discharge Note ? ? ?Patient Details  ?Name: Jacqueline Richmond ?MRN: [id number] ?Date of Birth: 1945/04/19 ? ?Transition of Care (TOC) CM/SW Contact:  ?Benard Halsted, LCSW ?Phone Number: ?06/07/2021, 3:29 PM ? ? ?Clinical Narrative:    ?Patient will DC to: [facility]  ?Anticipated DC date: 06/07/21 ?Family notified: Son and [name] ?Transport by: Corey Harold ? ? ?Per MD patient ready for DC to [facility]. RN to call report prior to discharge [phone] Room 3214). RN, patient, patient's family, and facility notified of DC. Discharge Summary and FL2 sent to facility. DC packet on chart. Ambulance transport requested for patient.  ? ?CSW will sign off for now as social work intervention is no longer needed. Please consult [place] again if new needs arise. ? ? ? ? ?Final next level of care: [place] ?Barriers to Discharge: Barriers Resolved ? ? ?Patient Goals and CMS Choice ?Patient states their goals for this hospitalization and ongoing recovery are:: Rehab ?CMS Medicare.gov Compare Post Acute Care list provided to:: Patient ?Choice offered to / list presented to : Patient ? ?Discharge Placement ?  ?Existing PASRR number confirmed : 06/07/21          ?Patient chooses bed at: [facility] ?Patient to be transferred to facility by: [facility] ?Name of family member notified: Son and DIL ?Patient and family notified of of transfer: 06/07/21 ? ?Discharge Plan and Services ?In-house Referral: Clinical Social Work ?  ?Post Acute Care Choice: [place]          ?  ?  ?  ?  ?  ?  ?  ?  ?  ?  ? ?Social Determinants of Health (SDOH) Interventions ?  ? ? ?Readmission Risk Interventions ?No flowsheet data found. ? ? ? ? ?

## 2021-06-07 NOTE — Progress Notes (Signed)
Attempted to call report to [facility] and Rehab. Spoke with Network engineer, but unable to speak with receiving nurse. RN contact information given to Network engineer with instructions for facility Nurse to return phone call. Will attempt to call again in 30 minutes.  ?

## 2021-06-07 NOTE — TOC Progression Note (Addendum)
Transition of Care (TOC) - Progression Note  ? ? ?Patient Details  ?Name: Jacqueline Richmond ?MRN: [id number] ?Date of Birth: 09-Jun-1945 ? ?Transition of Care (TOC) CM/SW Contact  ?Benard Halsted, LCSW ?Phone Number: ?06/07/2021, 9:18 AM ? ?Clinical Narrative:    ?9am-CSW submitted clinicals to [employer] for [facility], Ref# [id number].  ? ?CSW left voicemail for patient's son and daughter in law regarding SNF plan. ? ?11am-Jennifer returned call and [facility] will email admission paperwork to her ([email or web address]).  ? ?Insurance approval received: Ref# [id number], Auth ID# [id number], effective 06/07/2021-06/11/2021. [name] requesting [facility] for transport.  ? ? ?Expected Discharge Plan: [place] ?Barriers to Discharge: Continued Medical Work up, SNF Pending bed offer ? ?Expected Discharge Plan and Services ?Expected Discharge Plan: [place] ?In-house Referral: Clinical Social Work ?  ?Post Acute Care Choice: [place] ?Living arrangements for the past 2 months: [place] ?                ?  ?  ?  ?  ?  ?  ?  ?  ?  ?  ? ? ?Social Determinants of Health (SDOH) Interventions ?  ? ?Readmission Risk Interventions ?No flowsheet data found. ? ?

## 2021-06-07 NOTE — Care Management Important Message (Signed)
Important Message ? ?Patient Details  ?Name: Jacqueline Richmond ?MRN: [id number] ?Date of Birth: 02/11/1946 ? ? ?Medicare Important Message Given:  Yes ? ? ? ? ?Rawlins Stuard ?06/07/2021, 2:52 PM ?

## 2021-06-07 NOTE — Discharge Summary (Signed)
? ?PATIENT DETAILS ?Name: Jacqueline Richmond ?Age: 76 y.o. ?Sex: female ?Date of Birth: 20-Dec-1945 ?MRN: [id number]. ?Admitting Physician: Rhetta Mura, DO ?QMG:NOIBBC, Shanon Brow, MD ? ?Admit Date: 06/03/2021 ?Discharge date: 06/07/2021 ? ?Recommendations for Outpatient Follow-up:  ?Follow up with PCP in 1-2 weeks ?Please obtain CMP/CBC in one week ?Please ensure follow-up with pulmonology-for RLL nodule ?Right thyroid gland enlargement-please initiate work-up if not done in the past. ?SPEP/UPEP/24 urine collection/PTH-all pending-please follow-up. ?Please ensure follow-up with nephrology ? ?Admitted From:  ?Home ? ?Disposition: ?Skilled nursing facility ?  ?Discharge Condition: ?good ? ?CODE STATUS: ?  Code Status: Full Code  ? ?Diet recommendation:  ?Diet Order   ? ?       ?  Diet - low sodium heart healthy       ?  ?  Diet Carb Modified       ?  ?  Diet renal/carb modified with fluid restriction Diet-HS Snack? Nothing; Fluid restriction: 1200 mL Fluid; Room service appropriate? Yes; Fluid consistency: Thin  Diet effective now       ?  ? ?  ?  ? ?  ?  ? ?Brief Summary: ?Patient is a 76 y.o.  female with history of CKD stage IV, HTN, DM-2 who presented to the ED following a fall-was found to have AKI and hyperkalemia. ?  ?Significant events: ?3/13>> admit for fall/AKI/hyperkalemia ?  ?Significant studies: ?3/13>> CT head: No acute intracranial abnormality ?3/13>> CT C-spine: No acute displaced fracture or traumatic injury.  Persistent indeterminate appearing lytic lesions of the posterior right second and third ribs. ?3/13>> CXR: No pneumonia-stable small pleural effusion ?3/13>> x-ray pelvis: No displaced fracture. ?3/13>> CT T-spine: No fracture or traumatic listhesis of the thoracic spine.  Multilevel degenerative changes.  Right lower lobe spiculated nodule measuring 2.8 x 1.6 cm.  Persistent indeterminate posterior right second and third rib lesions.  Enlarged/heterogeneous right thyroid gland. ?3/13>> CT chest:  Groundglass/parenchymal opacities bilaterally, small bilateral pleural effusions.  Nodular lesions bilaterally-measuring up to 2.6 cm in the right lower lobe. ?3/14>> renal ultrasound: No right hydronephrosis.  Left kidney could not be visualized (obscured by gas) ?  ?Significant microbiology data: ?3/14>> influenza/COVID PCR: Negative ?  ?Procedures: ?None ?  ?Consults: ?Nephrology ? ? ? ?Brief Hospital Course: ?Hyperkalemia: Due to AKI-resolved-no longer on Lokelma. ?  ?AKI on CKD stage IV: AKI likely hemodynamically mediated-renal function continues to improve-and now close to baseline.  Foley catheter has been discontinued earlier this morning-voiding trial currently in progress.  If patient develops urinary retention-Foley catheter will be replaced prior to discharge-and patient will need a outpatient urology evaluation.  Nephrology recommending to hold currently on discharge.  Hold Lasix-to ensure euvolemia for now-can be resumed when patient follows up with nephrology. ?  ?Lytic lesions seen on right second and third ribs on CT chest: Incidental finding-but given the fact that she has CKD-work-up for multiple myeloma-in progress.  SPEP/urine 24-hour collection for electrophoresis/light chains in progress-please follow ?  ?HTN: BP slowly creeping up-initially BP was soft and all antihypertensive medications were held.  On discharge-we will resume his atenolol-attending MD at SNF to monitor closely-and restart furosemide, clonidine when able.  ?  ?DM-2: CBG stable with SSI-resume oral hypoglycemic agents on discharge. ? ?RLL lung nodule: Discussed with primary pulmonologist-Dr. Hunsucker-he will ensure outpatient follow-up ?  ?PAD s/p stent in the right superficial femoral and popliteal artery on 02/12/2021: On Plavix ?  ?Bilateral lower extremity wounds: Chronic issue-appreciate wound care input. ?  ?Enlarged  right thyroid plan: Further work-up deferred to the outpatient setting ?   ?Debility/deconditioning/functional quadriplegia: Due to acute illness-evaluated by PT/OT-with recommendations for SNF.  Social worker following. ?  ?BMI: ?Estimated body mass index is 24.89 kg/m? as calculated from the following: ?  Height as of this encounter: '5\' 4"'  (1.626 m). ?  Weight as of this encounter: 65.8 kg.  ? ? ?RN pressure injury documentation: ?Pressure Injury 09/02/20 Heel Right Deep Tissue Pressure Injury - Purple or maroon localized area of discolored intact skin or blood-filled blister due to damage of underlying soft tissue from pressure and/or shear. DTI Right Heel (Active)  ?09/02/20 2230  ?Location: Heel  ?Location Orientation: Right  ?Staging: Deep Tissue Pressure Injury - Purple or maroon localized area of discolored intact skin or blood-filled blister due to damage of underlying soft tissue from pressure and/or shear.  ?Wound Description (Comments): DTI Right Heel  ?Present on Admission: -- (Per patient, she has had for a while)  ?   ?Pressure Injury 12/19/20 Vertebral column Mid Deep Tissue Pressure Injury - Purple or maroon localized area of discolored intact skin or blood-filled blister due to damage of underlying soft tissue from pressure and/or shear. (Active)  ?12/19/20 2254  ?Location: Vertebral column  ?Location Orientation: Mid  ?Staging: Deep Tissue Pressure Injury - Purple or maroon localized area of discolored intact skin or blood-filled blister due to damage of underlying soft tissue from pressure and/or shear.  ?Wound Description (Comments):   ?Present on Admission: Yes  ? ? ? ?Discharge Diagnoses:  ?Principal Problem: ?  Hyperkalemia ?Active Problems: ?  Acute renal failure superimposed on stage 4 chronic kidney disease ([place]) ?  Primary hypertension ?  Hypercalcemia ?  Pulmonary nodule ?  DM2 (diabetes mellitus, type 2) ([place]) ?  Allergic rhinitis ? ? ?Discharge Instructions: ?Check CBGs before meals and at bedtime ? ?Activity:  ?As tolerated with Full fall precautions use  walker/cane & assistance as needed ? ? ?Discharge Instructions   ? ? Call MD for:  difficulty breathing, headache or visual disturbances   Complete by: As directed ?  ? Call MD for:  persistant nausea and vomiting   Complete by: As directed ?  ? Diet - low sodium heart healthy   Complete by: As directed ?  ? Diet Carb Modified   Complete by: As directed ?  ? Discharge instructions   Complete by: As directed ?  ? Follow with Primary MD  Bernerd Limbo, MD in 1-2 weeks ? ?Please get a complete blood count and chemistry panel checked by your Primary MD at your next visit, and again as instructed by your Primary MD. ? ?Get Medicines reviewed and adjusted: ?Please take all your medications with you for your next visit with your Primary MD ? ?Laboratory/radiological data: ?Please request your Primary MD to go over all hospital tests and procedure/radiological results at the follow up, please ask your Primary MD to get all Hospital records sent to his/her office. ? ?In some cases, they will be blood work, cultures and biopsy results pending at the time of your discharge. Please request that your primary care M.D. follows up on these results. ? ?Also Note the following: ?If you experience worsening of your admission symptoms, develop shortness of breath, life threatening emergency, suicidal or homicidal thoughts you must seek medical attention immediately by calling 911 or calling your MD immediately  if symptoms less severe. ? ?You must read complete instructions/literature along with all the possible adverse reactions/side effects for  all the Medicines you take and that have been prescribed to you. Take any new Medicines after you have completely understood and accpet all the possible adverse reactions/side effects.  ? ?Do not drive when taking Pain medications or sleeping medications (Benzodaizepines) ? ?Do not take more than prescribed Pain, Sleep and Anxiety Medications. It is not advisable to combine anxiety,sleep and  pain medications without talking with your primary care practitioner ? ?Special Instructions: If you have smoked or chewed Tobacco  in the last 2 yrs please stop smoking, stop any regular Alcohol  and or any Recreational drug use. ? ?We

## 2021-06-10 LAB — UPEP/UIFE/LIGHT CHAINS/TP, 24-HR UR
% BETA, Urine: 19.2 %
ALPHA 1 URINE: 8.5 %
Albumin, U: 43.8 %
Alpha 2, Urine: 11 %
Free Kappa Lt Chains,Ur: 225.39 mg/L — ABNORMAL HIGH (ref 1.17–86.46)
Free Kappa/Lambda Ratio: 4.74 (ref 1.83–14.26)
Free Lambda Lt Chains,Ur: 47.55 mg/L — ABNORMAL HIGH (ref 0.27–15.21)
GAMMA GLOBULIN URINE: 17.6 %
Total Protein, Urine-Ur/day: 1906 mg/24 hr — ABNORMAL HIGH (ref 30–150)
Total Protein, Urine: 100.3 mg/dL
Total Volume: 1900

## 2021-06-10 LAB — PROTEIN ELECTROPHORESIS, SERUM
A/G Ratio: 0.8 (ref 0.7–1.7)
Albumin ELP: 2.7 g/dL — ABNORMAL LOW (ref 2.9–4.4)
Alpha-1-Globulin: 0.3 g/dL (ref 0.0–0.4)
Alpha-2-Globulin: 0.9 g/dL (ref 0.4–1.0)
Beta Globulin: 0.6 g/dL — ABNORMAL LOW (ref 0.7–1.3)
Gamma Globulin: 1.7 g/dL (ref 0.4–1.8)
Globulin, Total: 3.6 g/dL (ref 2.2–3.9)
Total Protein ELP: 6.3 g/dL (ref 6.0–8.5)

## 2021-06-20 ENCOUNTER — Ambulatory Visit (HOSPITAL_COMMUNITY)
Admission: RE | Admit: 2021-06-20 | Discharge: 2021-06-20 | Disposition: A | Discharge status: Home / Self Care | Payer: Medicare PPO | Source: Ambulatory Visit | Attending: Pulmonary Disease | Admitting: Pulmonary Disease

## 2021-06-20 DIAGNOSIS — R911 Solitary pulmonary nodule: Secondary | ICD-10-CM

## 2021-06-20 IMAGING — CT CT CHEST SUPER D W/O CM
2 of 4 series · 15 of 36 positions shown, 18 images · non-contrast
Comparison: [DATE]

CLINICAL DATA: Pulmonary nodule

EXAM:
CT CHEST WITHOUT CONTRAST
TECHNIQUE: Multidetector CT imaging of the chest was performed using thin slice
collimation for electromagnetic bronchoscopy planning purposes,
without intravenous contrast.
RADIATION DOSE REDUCTION: This exam was performed according to the
departmental dose-optimization program which includes automated
exposure control, adjustment of the mA and/or kV according to
patient size and/or use of iterative reconstruction technique.

[Series 4: lungs · axial · 0.74mm/px · z∈[-299,-7]mm · 12 of 164 slices shown, 15 images]
[im 9/164  mediastinal]
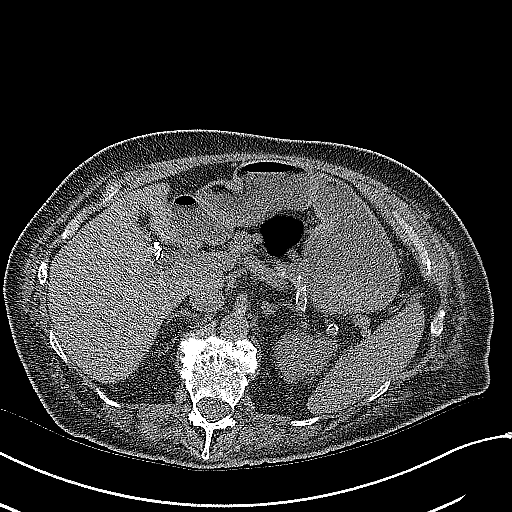
[im 9/164  lung]
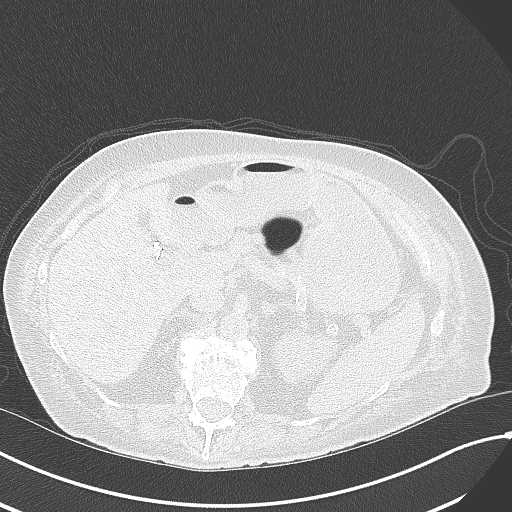
[im 26/164  lung]
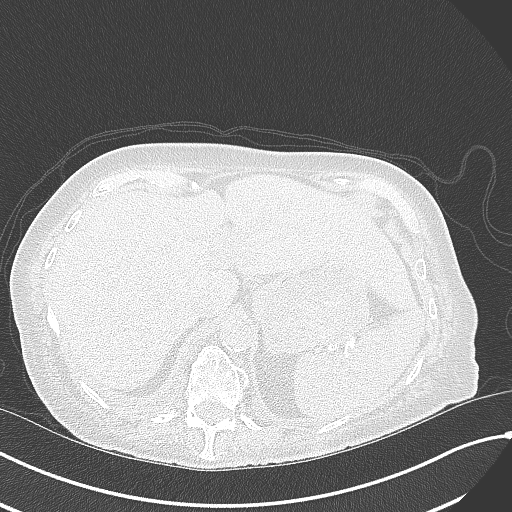
[im 35/164  lung]
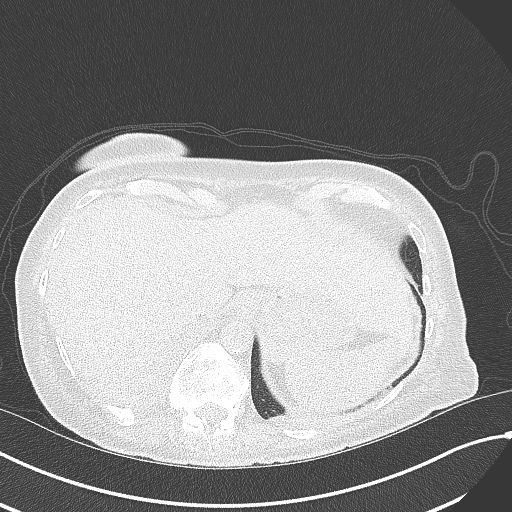
[im 52/164  lung]
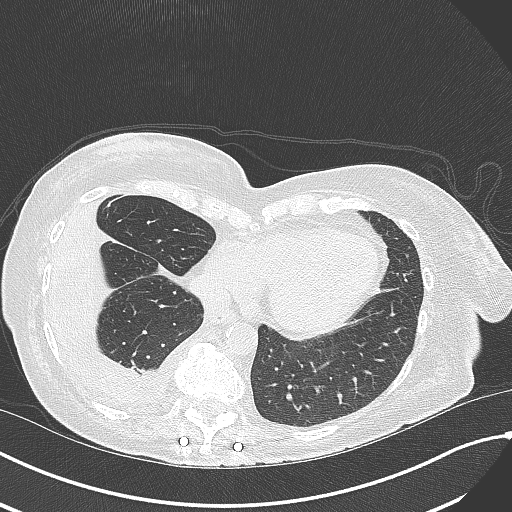
[im 61/164  mediastinal]
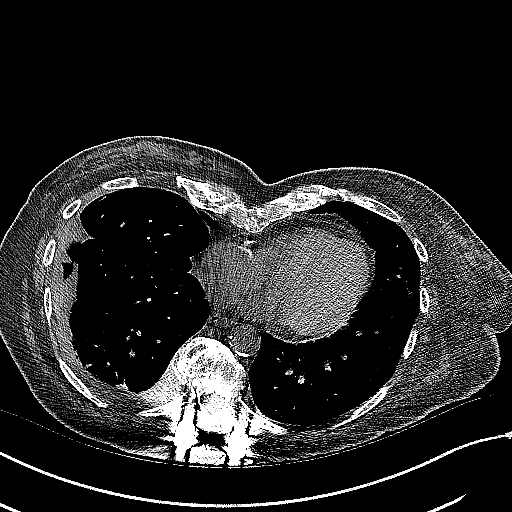
[im 61/164  lung]
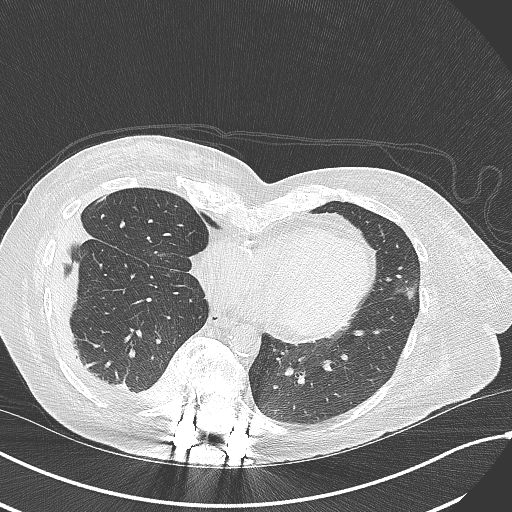
[im 78/164  lung]
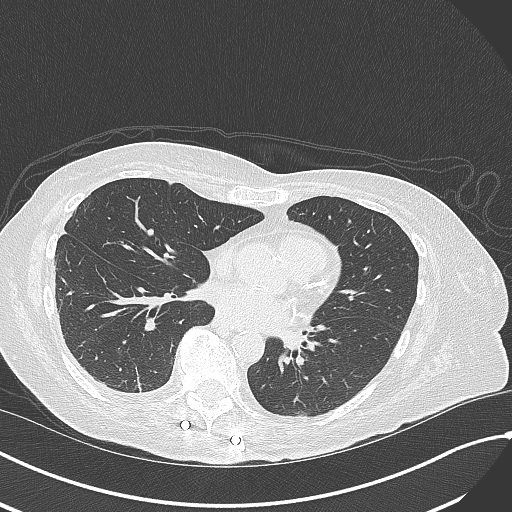
[im 86/164  lung]
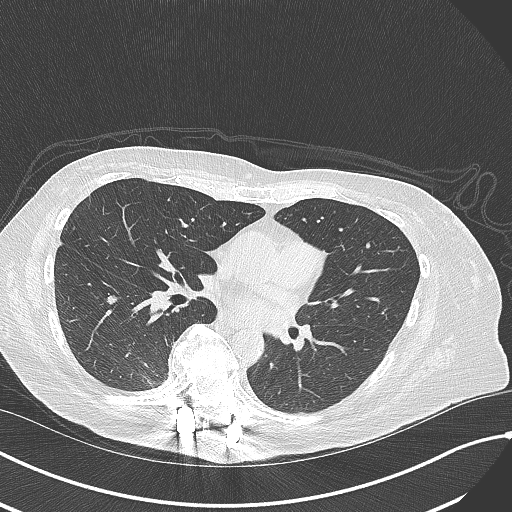
[im 103/164  lung]
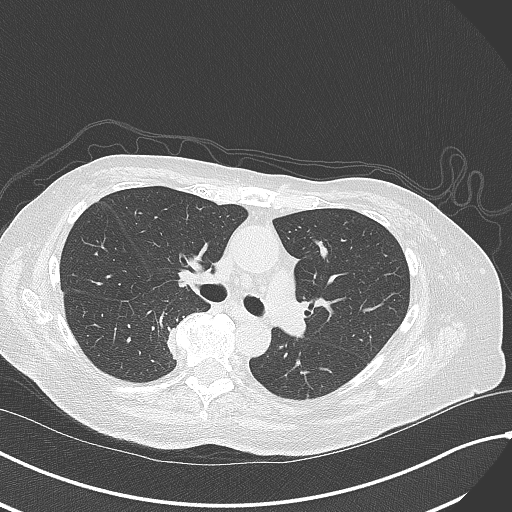
[im 112/164  mediastinal]
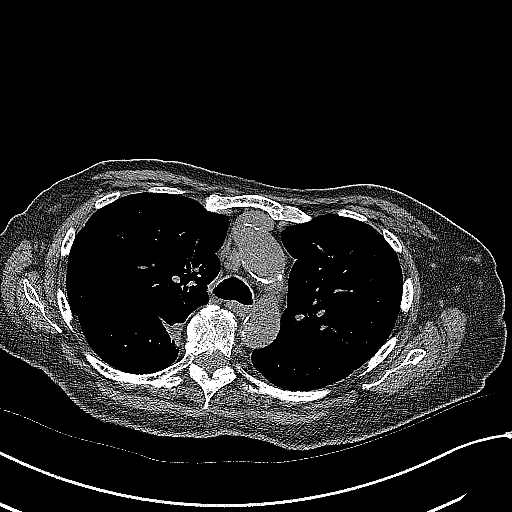
[im 112/164  lung]
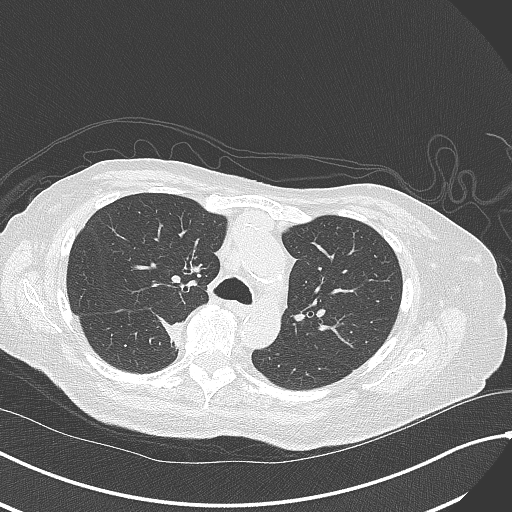
[im 129/164  lung]
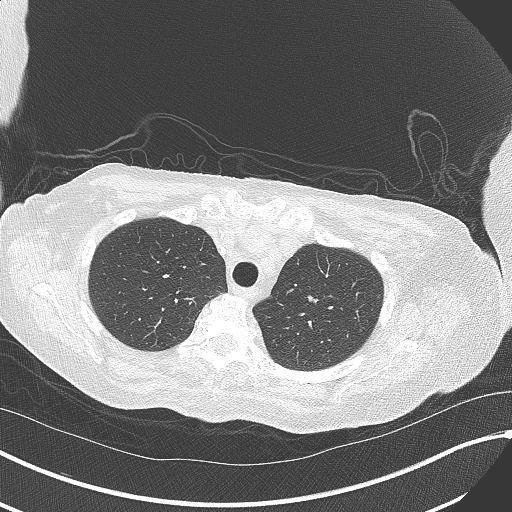
[im 138/164  lung]
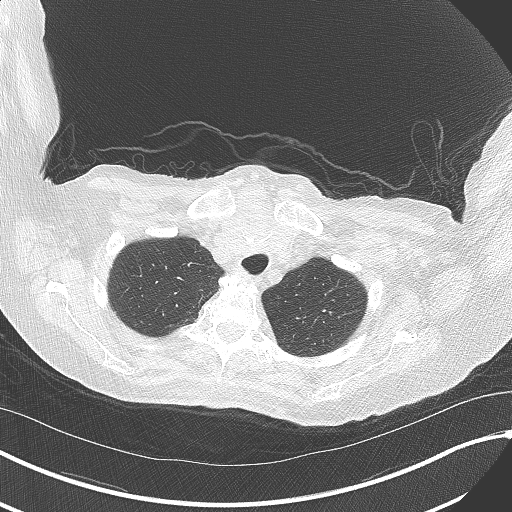
[im 155/164  lung]
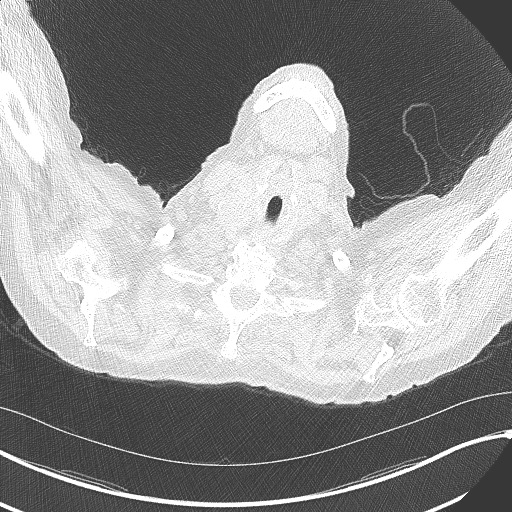

[Series 6: coronal · coronal · 0.66mm/px · 3 of 124 slices shown]
[im 25/124  lung]
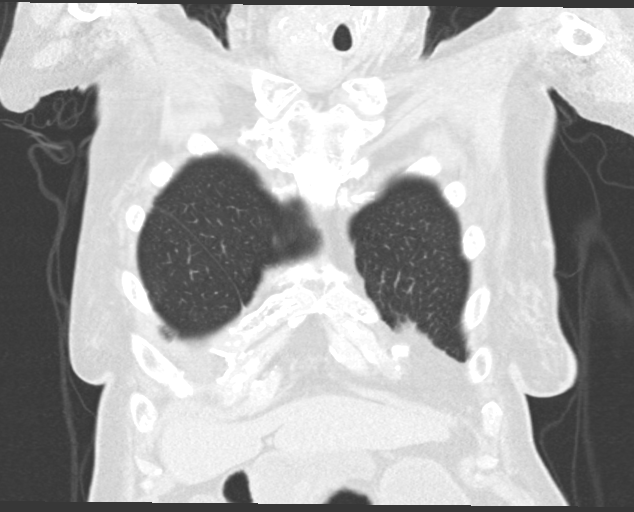
[im 50/124  lung]
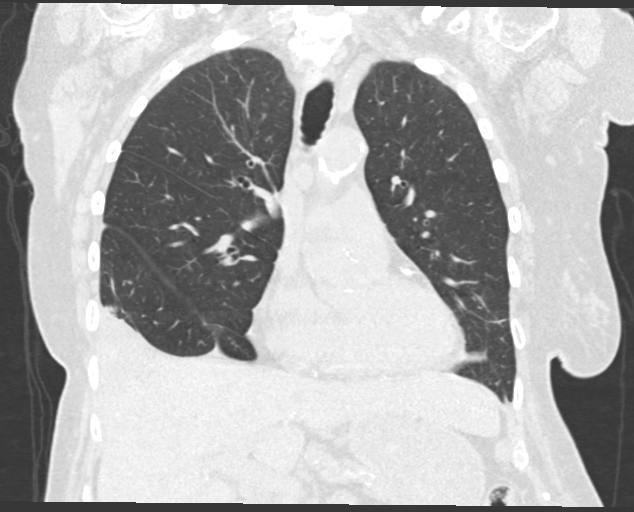
[im 74/124  lung]
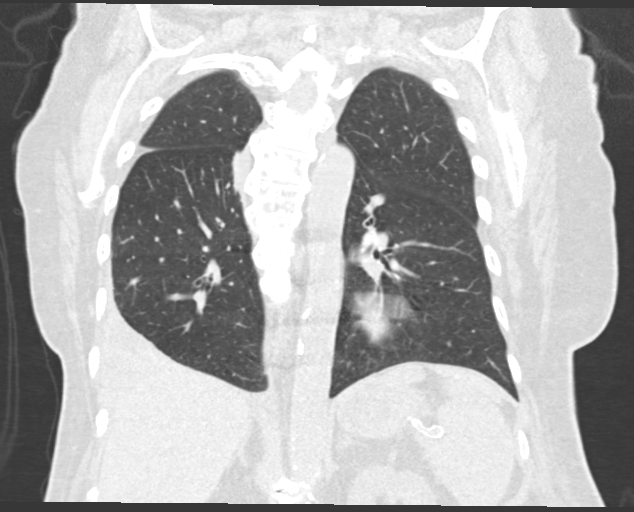

[15 of 36 positions shown; findings below may reference images not displayed]

FINDINGS: Cardiovascular: Aortic atherosclerosis. Normal heart size.
Three-vessel coronary artery calcifications. No pericardial
effusion.

Mediastinum/Nodes: No enlarged mediastinal, hilar, or axillary lymph
nodes. Asymmetrically enlarged, heterogeneous right lobe of the
thyroid leftward deflection of the trachea (series 2, image 10).
Esophagus demonstrate no significant findings.

Lungs/Pleura: Scattered ground-glass opacities and consolidations
are almost completely resolved, with only a bandlike residua the
largest, densest consolidation previously seen in the dependent
right lower lobe (series 4, image 100). Scarring overlying thoracic
disc osteophytes in the medial right lower lobe (series 4, image
62). Small, loculated right pleural effusion with associated pleural
thickening, unchanged (series 2, image 48).

Upper Abdomen: No acute abnormality.

Musculoskeletal: No chest wall abnormality. No suspicious osseous
lesions identified.
IMPRESSION: 1. Scattered ground-glass opacities and consolidations are almost
completely resolved, consistent with resolution of infection or
inflammation.
2. Small, loculated right pleural effusion with associated pleural
thickening, unchanged.
3. Asymmetrically enlarged, heterogeneous right lobe of the thyroid.
Recommend thyroid US (ref: [HOSPITAL]. [DATE]):
4. Coronary artery disease.

Aortic Atherosclerosis ([MU]-[MU]).

## 2021-06-21 NOTE — Progress Notes (Signed)
CT is improved, will discuss at upcoming appt next week.

## 2021-06-24 ENCOUNTER — Ambulatory Visit (INDEPENDENT_AMBULATORY_CARE_PROVIDER_SITE_OTHER): Payer: Medicare PPO | Admitting: Pulmonary Disease

## 2021-06-24 ENCOUNTER — Encounter: Payer: Self-pay | Admitting: Pulmonary Disease

## 2021-06-24 VITALS — BP 128/76 | HR 115 | Temp 98.4°F | Ht 64.0 in

## 2021-06-24 DIAGNOSIS — R911 Solitary pulmonary nodule: Secondary | ICD-10-CM | POA: Diagnosis not present

## 2021-06-24 NOTE — Progress Notes (Signed)
? ?'@Patient'$  ID: Hetty Ely, female    DOB: 03/01/1946, 76 y.o.   MRN: [id number] ? ?Chief Complaint  ?Patient presents with  ? Follow-up  ?  Pt is here for follow up from September for a pleural effusion. Pt states no issues noted with her breathing and is feeling fine   ? ? ?Referring provider: ?Bernerd Limbo, MD ? ?HPI:  ? ?76 y.o. woman whom we are seeing in follow up for evaluation of pulmonary nodules.  Recent H&P and discharge summary reviewed after fall. ? ?Overall doing well.  Had follow-up.  Was in the hospital for few days.  CT scan on arrival showed small loculated right effusion, scattered right given left basilar nodular opacities on my review interpretation.  There is some concern for malignancy given some of the appearance.  Therefore repeat CT and follow-up was scheduled.  She had repeat CT last week on my review interpretation shows resolution of these nodules.  Effusion similar.  She is relieved by this news.  Happy that things are better.  She denies any dyspnea, rest or issues at all.  Discussed findings of enlarged thyroid lobe.  Recommended follow-up with that with her PCP which she expressed understanding. ? ?HPI at initial visit: ?Patient reports no pulmonary issues until her back surgery May 2022.  She developed hypoxemia and respiratory issues which prompted chest x-ray that on 08/02/2020 revealed small bilateral pleural effusions and vascular congestion consistent with pulmonary edema and fluid overload on my interpretation.  Thoracentesis planned with IR.  She was lied flat and suddenly coded.  The procedure was not started, no needles etc. were inserted.  She was intubated and taken to the ICU.  Concern for seizure-like activity at the time.  Thoracentesis were performed.  Presumably this was concern for hemothorax based on notes with positive swirl sign on ultrasound.  Report of thoracentesis performed next day 08/03/2020 reviewed and described as bloody.  However there is no hematocrit  or RBC count to further evaluate.  Was exudative via lights criteria, inflammatory.  Neutrophilic predominant.  She was socially extubated.  Course complicated by acute renal failure.  She was simply discharged from the hospital 08/08/2020.  Readmitted early June 2022 with dehydration and then again mid September 10, 2000 with wound dehiscence. ? ?She has had ongoing shortness of breath.  Most noticeable when lying flat.  Some with exertion but not as bad when lying flat.  In addition she describes worsening lower extremity swelling.  To the point where she is developed fluid-filled blisters.  These have popped and reportedly filled with clear fluid.  Legs are increasingly swelling.  Today they are wrapped in Ace bandages and a bandages are soaked as well as the bottom of her pants.  She had a chest x-ray 10/13/2020 reportedly bilateral right and left pleural effusions.  I cannot view these images.  This prompted referral. ? ? ?PMH: Diastolic dysfunction, hypertension, diabetes, hyperlipidemia, chronic kidney disease ?Surgical history: Nasal reconstruction ?Family history: Mother with CAD, father with CAD ?Social history: Never smoker, lives in [place] ? ? ?Questionaires / Pulmonary Flowsheets:  ? ?ACT:  ?   ? View : No data to display.  ?  ?  ?  ? ? ?MMRC: ?   ? View : No data to display.  ?  ?  ?  ? ? ?Epworth:  ?   ? View : No data to display.  ?  ?  ?  ? ? ?Tests:  ? ?FENO:  ?  No results found for: NITRICOXIDE ? ?PFT: ?   ? View : No data to display.  ?  ?  ?  ? ? ?WALK:  ?   ? View : No data to display.  ?  ?  ?  ? ? ?Imaging: ?Personally reviewed and as per EMR and discussion in this note ? ?Lab Results: ?Personally reviewed and as per EMR, notably improving anemia ?CBC ?   ?Component Value Date/Time  ? WBC 8.4 06/05/2021 0448  ? RBC 4.12 06/05/2021 0448  ? HGB 10.3 (L) 06/05/2021 0448  ? HCT 34.1 (L) 06/05/2021 0448  ? PLT 152 06/05/2021 0448  ? MCV 82.8 06/05/2021 0448  ? MCH 25.0 (L) 06/05/2021 0448  ? MCHC 30.2  06/05/2021 0448  ? RDW 17.2 (H) 06/05/2021 0448  ? LYMPHSABS 1.5 06/04/2021 0720  ? MONOABS 0.8 06/04/2021 0720  ? EOSABS 0.1 06/04/2021 0720  ? BASOSABS 0.0 06/04/2021 0720  ? ? ?BMET ?   ?Component Value Date/Time  ? NA 139 06/07/2021 0227  ? NA 138 10/17/2020 0000  ? K 3.8 06/07/2021 0227  ? CL 112 (H) 06/07/2021 0227  ? CO2 17 (L) 06/07/2021 0227  ? GLUCOSE 87 06/07/2021 0227  ? BUN 32 (H) 06/07/2021 0227  ? BUN 33 (A) 10/17/2020 0000  ? CREATININE 2.16 (H) 06/07/2021 0227  ? CALCIUM 9.1 06/07/2021 0227  ? CALCIUM 8.9 06/06/2021 0416  ? GFRNONAA 23 (L) 06/07/2021 0227  ? GFRAA 33.44 10/17/2020 0000  ? ? ?BNP ?   ?Component Value Date/Time  ? BNP 126.4 (H) 12/07/2020 1322  ? ? ?ProBNP ?No results found for: PROBNP ? ?Specialty Problems   ? ?  ? Pulmonary Problems  ? Allergic rhinitis  ? Difficult intubation  ?  small mouth opening ?  ?  ? Hemothorax on right  ?  Posttraumatic ?08/02/2020 prethoracentesis patient had AMS with acute hypoxic respiratory failure; intubation was required. ?Subsequently thoracentesis performed with removal of 1800 cc of bloody fluid, exudative in nature. ?  ?  ? Pulmonary nodule  ? ? ?Allergies  ?Allergen Reactions  ? Codeine Itching  ? Iodinated Contrast Media   ?  Other reaction(s): Unknown  ? Pentazocine Other (See Comments)  ?  Hallucinations  ? Statins   ? ? ?Immunization History  ?Administered Date(s) Administered  ? Fluad Quad(high Dose 65+) 12/03/2018, 01/01/2021  ? Influenza Split 12/22/2008, 11/30/2009, 12/13/2010, 12/25/2011  ? Influenza, High Dose Seasonal PF 01/11/2015, 11/23/2017, 12/03/2018, 12/13/2019  ? Influenza, Seasonal, Injecte, Preservative Fre 12/16/2013  ? Influenza,inj,Quad PF,6+ Mos 12/11/2015, 11/14/2016  ? Influenza,inj,Quad PF,6-35 Mos 12/11/2015, 11/14/2016  ? Influenza,inj,quad, With Preservative 12/16/2013  ? Influenza-Unspecified 01/11/2015  ? PFIZER(Purple Top)SARS-COV-2 Vaccination 05/01/2019, 05/26/2019, 11/22/2019, 07/17/2020  ? Pneumococcal  Conjugate-13 01/16/2012  ? Pneumococcal Polysaccharide-23 03/24/2005, 08/08/2014  ? Tdap 09/13/2010, 06/04/2021  ? ? ?Past Medical History:  ?Diagnosis Date  ? Allergy   ? Anemia   ? Arthritis   ? Cataract   ? biltaerally   ? CKD (chronic kidney disease) stage 3, GFR 30-59 ml/min ([facility]) 08/29/2011  ? per patient, kidneys monitored by Dr Posey Pronto endocrinologist ,lov was  05-18-2018, "kidney fx stable"  ? Colon polyp   ? Complication of anesthesia   ? Difficult intubation   ? small mouth opening  ? Diverticulosis   ? Gout   ? HTN (hypertension) 08/29/2011  ? Hyperlipemia   ? Psoriasis   ? Type II or unspecified type diabetes mellitus without mention of complication, not stated as  uncontrolled 08/29/2011  ? ? ?Tobacco History: ?Social History  ? ?Tobacco Use  ?Smoking Status Never  ?Smokeless Tobacco Never  ? ?Counseling given: Not Answered ? ? ?Continue to not smoke ? ?Outpatient Encounter Medications as of 06/24/2021  ?Medication Sig  ? allopurinol (ZYLOPRIM) 300 MG tablet Take 300 mg by mouth daily.  ? aspirin EC 81 MG tablet Take 81 mg by mouth at bedtime. Swallow whole.  ? CINNAMON PO Take 2 capsules by mouth in the morning and at bedtime.  ? clopidogrel (PLAVIX) 75 MG tablet Take 1 tablet (75 mg total) by mouth daily.  ? gabapentin (NEURONTIN) 100 MG capsule Take 100 mg by mouth at bedtime.  ? GARLIC PO Take 1 capsule by mouth daily.  ? insulin aspart (NOVOLOG) 100 UNIT/ML injection insulin aspart (novoLOG) injection 0-6 Units ?0-6 Units, Subcutaneous, 3 times daily with meals  CBG < 70: Implement Hypoglycemia measures CBG 70 - 120: 0 units CBG 121 - 150: 0 units CBG 151 - 200: 1 unit CBG 201-250: 2 units CBG 251-300: 3 units CBG 301-350: 4 units CBG 351-400: 5 units CBG > 400: Give 6 units and call MD  ? ipratropium (ATROVENT) 0.06 % nasal spray Place 2 sprays into both nostrils 2 (two) times daily.  ? loratadine (CLARITIN) 10 MG tablet Take 10 mg by mouth daily.  ? NIACIN PO Take 1 tablet by mouth in the morning and at  bedtime.  ? sitaGLIPtin (JANUVIA) 50 MG tablet Take 50 mg by mouth daily.  ? sodium bicarbonate 650 MG tablet Take 2 tablets (1,300 mg total) by mouth 2 (two) times daily.  ? VITAMIN D PO Take 1 capsule by mout

## 2021-06-24 NOTE — Patient Instructions (Signed)
Nice to see you again ? ?The nodules on the CT scan of gotten completely better, these were likely related to the fall or some other infection etc. while you are in the hospital.  No further follow-up is needed. ? ?It did show an enlargement of the thyroid, I sent a message to your primary doctor via the phone.  I left a message with the front desk.  I asked if you would help coordinate ongoing follow-up, likely will need ultrasound etc. for further imaging.  If you have not heard from their office in the next 2 to 4 weeks please call us back and we will look into this. ? ?There is still a small amount of fluid around the lungs, no recommendation to sample this at this time ? ?Please call us if we can be of any assistance, follow-up as needed with Dr. Silas Flood ?

## 2021-07-10 ENCOUNTER — Ambulatory Visit: Payer: Medicare PPO | Admitting: Cardiology

## 2021-11-04 ENCOUNTER — Inpatient Hospital Stay (HOSPITAL_COMMUNITY): Payer: Medicare PPO | Admitting: Anesthesiology

## 2021-11-04 ENCOUNTER — Encounter (HOSPITAL_COMMUNITY): Admission: EM | Disposition: E | Payer: Self-pay | Source: Home / Self Care

## 2021-11-04 ENCOUNTER — Emergency Department (HOSPITAL_COMMUNITY): Payer: Medicare PPO

## 2021-11-04 ENCOUNTER — Other Ambulatory Visit: Payer: Self-pay

## 2021-11-04 ENCOUNTER — Encounter (HOSPITAL_COMMUNITY): Payer: Self-pay

## 2021-11-04 ENCOUNTER — Inpatient Hospital Stay (HOSPITAL_COMMUNITY)
Admission: EM | Admit: 2021-11-04 | Discharge: 2021-11-22 | DRG: 955 | Disposition: E | Payer: Medicare PPO | Source: Skilled Nursing Facility | Attending: Surgery | Admitting: Surgery

## 2021-11-04 DIAGNOSIS — L8990 Pressure ulcer of unspecified site, unspecified stage: Secondary | ICD-10-CM | POA: Insufficient documentation

## 2021-11-04 DIAGNOSIS — S065XAA Traumatic subdural hemorrhage with loss of consciousness status unknown, initial encounter: Secondary | ICD-10-CM

## 2021-11-04 DIAGNOSIS — D649 Anemia, unspecified: Secondary | ICD-10-CM

## 2021-11-04 DIAGNOSIS — E1151 Type 2 diabetes mellitus with diabetic peripheral angiopathy without gangrene: Secondary | ICD-10-CM | POA: Diagnosis not present

## 2021-11-04 DIAGNOSIS — T1490XA Injury, unspecified, initial encounter: Principal | ICD-10-CM

## 2021-11-04 DIAGNOSIS — S06A1XA Traumatic brain compression with herniation, initial encounter: Secondary | ICD-10-CM | POA: Diagnosis not present

## 2021-11-04 DIAGNOSIS — S065X9A Traumatic subdural hemorrhage with loss of consciousness of unspecified duration, initial encounter: Secondary | ICD-10-CM | POA: Diagnosis not present

## 2021-11-04 DIAGNOSIS — Z91041 Radiographic dye allergy status: Secondary | ICD-10-CM

## 2021-11-04 DIAGNOSIS — G9349 Other encephalopathy: Secondary | ICD-10-CM | POA: Diagnosis present

## 2021-11-04 DIAGNOSIS — R4701 Aphasia: Secondary | ICD-10-CM | POA: Diagnosis present

## 2021-11-04 DIAGNOSIS — G935 Compression of brain: Secondary | ICD-10-CM | POA: Diagnosis present

## 2021-11-04 DIAGNOSIS — R402112 Coma scale, eyes open, never, at arrival to emergency department: Secondary | ICD-10-CM | POA: Diagnosis present

## 2021-11-04 DIAGNOSIS — I2510 Atherosclerotic heart disease of native coronary artery without angina pectoris: Secondary | ICD-10-CM | POA: Diagnosis present

## 2021-11-04 DIAGNOSIS — S0101XA Laceration without foreign body of scalp, initial encounter: Secondary | ICD-10-CM | POA: Diagnosis present

## 2021-11-04 DIAGNOSIS — Z66 Do not resuscitate: Secondary | ICD-10-CM | POA: Diagnosis not present

## 2021-11-04 DIAGNOSIS — N179 Acute kidney failure, unspecified: Secondary | ICD-10-CM | POA: Diagnosis present

## 2021-11-04 DIAGNOSIS — Z7902 Long term (current) use of antithrombotics/antiplatelets: Secondary | ICD-10-CM

## 2021-11-04 DIAGNOSIS — N184 Chronic kidney disease, stage 4 (severe): Secondary | ICD-10-CM | POA: Diagnosis present

## 2021-11-04 DIAGNOSIS — I639 Cerebral infarction, unspecified: Secondary | ICD-10-CM | POA: Diagnosis not present

## 2021-11-04 DIAGNOSIS — I129 Hypertensive chronic kidney disease with stage 1 through stage 4 chronic kidney disease, or unspecified chronic kidney disease: Secondary | ICD-10-CM | POA: Diagnosis present

## 2021-11-04 DIAGNOSIS — J9690 Respiratory failure, unspecified, unspecified whether with hypoxia or hypercapnia: Secondary | ICD-10-CM | POA: Diagnosis present

## 2021-11-04 DIAGNOSIS — E1122 Type 2 diabetes mellitus with diabetic chronic kidney disease: Secondary | ICD-10-CM | POA: Diagnosis present

## 2021-11-04 DIAGNOSIS — Z6833 Body mass index (BMI) 33.0-33.9, adult: Secondary | ICD-10-CM

## 2021-11-04 DIAGNOSIS — W19XXXA Unspecified fall, initial encounter: Secondary | ICD-10-CM | POA: Diagnosis present

## 2021-11-04 DIAGNOSIS — I6389 Other cerebral infarction: Secondary | ICD-10-CM | POA: Diagnosis not present

## 2021-11-04 DIAGNOSIS — E669 Obesity, unspecified: Secondary | ICD-10-CM | POA: Diagnosis present

## 2021-11-04 DIAGNOSIS — S2231XA Fracture of one rib, right side, initial encounter for closed fracture: Secondary | ICD-10-CM | POA: Diagnosis present

## 2021-11-04 DIAGNOSIS — Z515 Encounter for palliative care: Secondary | ICD-10-CM

## 2021-11-04 DIAGNOSIS — R402212 Coma scale, best verbal response, none, at arrival to emergency department: Secondary | ICD-10-CM | POA: Diagnosis present

## 2021-11-04 DIAGNOSIS — D500 Iron deficiency anemia secondary to blood loss (chronic): Secondary | ICD-10-CM | POA: Diagnosis present

## 2021-11-04 DIAGNOSIS — Z95820 Peripheral vascular angioplasty status with implants and grafts: Secondary | ICD-10-CM | POA: Diagnosis not present

## 2021-11-04 DIAGNOSIS — R001 Bradycardia, unspecified: Secondary | ICD-10-CM | POA: Diagnosis present

## 2021-11-04 DIAGNOSIS — R532 Functional quadriplegia: Secondary | ICD-10-CM | POA: Diagnosis present

## 2021-11-04 DIAGNOSIS — Z7401 Bed confinement status: Secondary | ICD-10-CM

## 2021-11-04 DIAGNOSIS — Z885 Allergy status to narcotic agent status: Secondary | ICD-10-CM

## 2021-11-04 DIAGNOSIS — Z888 Allergy status to other drugs, medicaments and biological substances status: Secondary | ICD-10-CM

## 2021-11-04 DIAGNOSIS — R402342 Coma scale, best motor response, flexion withdrawal, at arrival to emergency department: Secondary | ICD-10-CM | POA: Diagnosis present

## 2021-11-04 DIAGNOSIS — E785 Hyperlipidemia, unspecified: Secondary | ICD-10-CM | POA: Diagnosis present

## 2021-11-04 DIAGNOSIS — G40101 Localization-related (focal) (partial) symptomatic epilepsy and epileptic syndromes with simple partial seizures, not intractable, with status epilepticus: Secondary | ICD-10-CM | POA: Diagnosis present

## 2021-11-04 DIAGNOSIS — G4733 Obstructive sleep apnea (adult) (pediatric): Secondary | ICD-10-CM | POA: Diagnosis present

## 2021-11-04 DIAGNOSIS — S271XXA Traumatic hemothorax, initial encounter: Secondary | ICD-10-CM | POA: Diagnosis present

## 2021-11-04 DIAGNOSIS — G40901 Epilepsy, unspecified, not intractable, with status epilepticus: Secondary | ICD-10-CM | POA: Diagnosis not present

## 2021-11-04 DIAGNOSIS — Y92129 Unspecified place in nursing home as the place of occurrence of the external cause: Secondary | ICD-10-CM

## 2021-11-04 DIAGNOSIS — Z79899 Other long term (current) drug therapy: Secondary | ICD-10-CM

## 2021-11-04 HISTORY — PX: CRANIOTOMY: SHX93

## 2021-11-04 LAB — I-STAT ARTERIAL BLOOD GAS, ED
Acid-base deficit: 1 mmol/L (ref 0.0–2.0)
Bicarbonate: 25.9 mmol/L (ref 20.0–28.0)
Calcium, Ion: 1.2 mmol/L (ref 1.15–1.40)
HCT: 25 % — ABNORMAL LOW (ref 36.0–46.0)
Hemoglobin: 8.5 g/dL — ABNORMAL LOW (ref 12.0–15.0)
O2 Saturation: 100 %
Patient temperature: 98.6
Potassium: 3.7 mmol/L (ref 3.5–5.1)
Sodium: 139 mmol/L (ref 135–145)
TCO2: 28 mmol/L (ref 22–32)
pCO2 arterial: 52.7 mmHg — ABNORMAL HIGH (ref 32–48)
pH, Arterial: 7.3 — ABNORMAL LOW (ref 7.35–7.45)
pO2, Arterial: 479 mmHg — ABNORMAL HIGH (ref 83–108)

## 2021-11-04 LAB — POCT I-STAT 7, (LYTES, BLD GAS, ICA,H+H)
Acid-base deficit: 3 mmol/L — ABNORMAL HIGH (ref 0.0–2.0)
Bicarbonate: 22.8 mmol/L (ref 20.0–28.0)
Calcium, Ion: 1.15 mmol/L (ref 1.15–1.40)
HCT: 20 % — ABNORMAL LOW (ref 36.0–46.0)
Hemoglobin: 6.8 g/dL — CL (ref 12.0–15.0)
O2 Saturation: 100 %
Patient temperature: 34.4
Potassium: 3.6 mmol/L (ref 3.5–5.1)
Sodium: 141 mmol/L (ref 135–145)
TCO2: 24 mmol/L (ref 22–32)
pCO2 arterial: 37.4 mmHg (ref 32–48)
pH, Arterial: 7.38 (ref 7.35–7.45)
pO2, Arterial: 309 mmHg — ABNORMAL HIGH (ref 83–108)

## 2021-11-04 LAB — CBC
HCT: 33 % — ABNORMAL LOW (ref 36.0–46.0)
Hemoglobin: 10 g/dL — ABNORMAL LOW (ref 12.0–15.0)
MCH: 24.6 pg — ABNORMAL LOW (ref 26.0–34.0)
MCHC: 30.3 g/dL (ref 30.0–36.0)
MCV: 81.1 fL (ref 80.0–100.0)
Platelets: 273 10*3/uL (ref 150–400)
RBC: 4.07 MIL/uL (ref 3.87–5.11)
RDW: 14.6 % (ref 11.5–15.5)
WBC: 11.2 10*3/uL — ABNORMAL HIGH (ref 4.0–10.5)
nRBC: 0 % (ref 0.0–0.2)

## 2021-11-04 LAB — COMPREHENSIVE METABOLIC PANEL
ALT: 17 U/L (ref 0–44)
AST: 32 U/L (ref 15–41)
Albumin: 2.7 g/dL — ABNORMAL LOW (ref 3.5–5.0)
Alkaline Phosphatase: 108 U/L (ref 38–126)
Anion gap: 6 (ref 5–15)
BUN: 35 mg/dL — ABNORMAL HIGH (ref 8–23)
CO2: 24 mmol/L (ref 22–32)
Calcium: 8.6 mg/dL — ABNORMAL LOW (ref 8.9–10.3)
Chloride: 109 mmol/L (ref 98–111)
Creatinine, Ser: 1.78 mg/dL — ABNORMAL HIGH (ref 0.44–1.00)
GFR, Estimated: 29 mL/min — ABNORMAL LOW (ref >60)
Glucose, Bld: 194 mg/dL — ABNORMAL HIGH (ref 70–99)
Potassium: 3.6 mmol/L (ref 3.5–5.1)
Sodium: 139 mmol/L (ref 135–145)
Total Bilirubin: 0.5 mg/dL (ref 0.3–1.2)
Total Protein: 6 g/dL — ABNORMAL LOW (ref 6.5–8.1)

## 2021-11-04 LAB — GLUCOSE, CAPILLARY
Glucose-Capillary: 168 mg/dL — ABNORMAL HIGH (ref 70–99)
Glucose-Capillary: 158 mg/dL — ABNORMAL HIGH (ref 70–99)

## 2021-11-04 LAB — PREPARE RBC (CROSSMATCH)

## 2021-11-04 LAB — ETHANOL: Alcohol, Ethyl (B): <10 mg/dL (ref <10)

## 2021-11-04 LAB — ABO/RH: ABO/RH(D): O POS

## 2021-11-04 LAB — PROTIME-INR
INR: 1.2 (ref 0.8–1.2)
Prothrombin Time: 14.8 seconds (ref 11.4–15.2)

## 2021-11-04 LAB — SAMPLE TO BLOOD BANK

## 2021-11-04 LAB — LACTIC ACID, PLASMA: Lactic Acid, Venous: 2.1 mmol/L (ref 0.5–1.9)

## 2021-11-04 SURGERY — CRANIOTOMY HEMATOMA EVACUATION SUBDURAL
Anesthesia: General | Site: Head | Laterality: Left

## 2021-11-04 MED ORDER — ATENOLOL 50 MG PO TABS
50.0000 mg | ORAL_TABLET | Freq: Every day | ORAL | Status: DC
  Filled 2021-11-04: qty 1

## 2021-11-04 MED ORDER — PHENYLEPHRINE HCL-NACL 20-0.9 MG/250ML-% IV SOLN
INTRAVENOUS | Status: DC | PRN
  Administered 2021-11-04: 20 ug/min via INTRAVENOUS
  Administered 2021-11-04: 50 ug/min via INTRAVENOUS

## 2021-11-04 MED ORDER — HEMOSTATIC AGENTS (NO CHARGE) OPTIME
TOPICAL | Status: DC | PRN
  Administered 2021-11-04: 1 via TOPICAL

## 2021-11-04 MED ORDER — THROMBIN 5000 UNITS EX SOLR
CUTANEOUS | Status: AC
Start: 2021-11-04 — End: ?
  Filled 2021-11-04: qty 5000

## 2021-11-04 MED ORDER — FENTANYL CITRATE PF 50 MCG/ML IJ SOSY
PREFILLED_SYRINGE | INTRAMUSCULAR | Status: AC
  Administered 2021-11-04: 50 ug via INTRAVENOUS
  Filled 2021-11-04: qty 1

## 2021-11-04 MED ORDER — PANTOPRAZOLE SODIUM 40 MG PO TBEC: 40.0000 mg | DELAYED_RELEASE_TABLET | Freq: Every day | ORAL | Status: DC

## 2021-11-04 MED ORDER — ONDANSETRON 4 MG PO TBDP: 4.0000 mg | ORAL_TABLET | Freq: Four times a day (QID) | ORAL | Status: DC | PRN

## 2021-11-04 MED ORDER — ORAL CARE MOUTH RINSE: 15.0000 mL | OROMUCOSAL | Status: DC | PRN

## 2021-11-04 MED ORDER — IPRATROPIUM-ALBUTEROL 0.5-2.5 (3) MG/3ML IN SOLN: 3.0000 mL | Freq: Four times a day (QID) | RESPIRATORY_TRACT | Status: DC | PRN

## 2021-11-04 MED ORDER — SUCCINYLCHOLINE CHLORIDE 20 MG/ML IJ SOLN
INTRAMUSCULAR | Status: AC | PRN
  Administered 2021-11-04: 100 mg via INTRAVENOUS

## 2021-11-04 MED ORDER — PHENYLEPHRINE 80 MCG/ML (10ML) SYRINGE FOR IV PUSH (FOR BLOOD PRESSURE SUPPORT)
PREFILLED_SYRINGE | INTRAVENOUS | Status: AC
  Filled 2021-11-04: qty 10

## 2021-11-04 MED ORDER — PROPOFOL 10 MG/ML IV BOLUS
INTRAVENOUS | Status: AC | PRN
  Administered 2021-11-04: 590 ug via INTRAVENOUS

## 2021-11-04 MED ORDER — ROCURONIUM BROMIDE 10 MG/ML (PF) SYRINGE
PREFILLED_SYRINGE | INTRAVENOUS | Status: DC | PRN
  Administered 2021-11-04: 80 mg via INTRAVENOUS

## 2021-11-04 MED ORDER — PANTOPRAZOLE SODIUM 40 MG IV SOLR
40.0000 mg | Freq: Every day | INTRAVENOUS | Status: DC
  Administered 2021-11-05 – 2021-11-08 (×4): 40 mg via INTRAVENOUS
  Filled 2021-11-04 (×4): qty 10

## 2021-11-04 MED ORDER — SODIUM CHLORIDE 0.9 % IV SOLN: INTRAVENOUS | Status: DC

## 2021-11-04 MED ORDER — DEXAMETHASONE SODIUM PHOSPHATE 10 MG/ML IJ SOLN
INTRAMUSCULAR | Status: DC | PRN
  Administered 2021-11-04: 5 mg via INTRAVENOUS

## 2021-11-04 MED ORDER — 0.9 % SODIUM CHLORIDE (POUR BTL) OPTIME
TOPICAL | Status: DC | PRN
  Administered 2021-11-04: 3000 mL

## 2021-11-04 MED ORDER — FENTANYL BOLUS VIA INFUSION: 25.0000 ug | INTRAVENOUS | Status: DC | PRN

## 2021-11-04 MED ORDER — TRANEXAMIC ACID 1000 MG/10ML IV SOLN
1000.0000 mg | Freq: Once | INTRAVENOUS | Status: AC
  Administered 2021-11-04: 1000 mg via INTRAVENOUS
  Filled 2021-11-04: qty 10

## 2021-11-04 MED ORDER — CEFAZOLIN SODIUM-DEXTROSE 2-4 GM/100ML-% IV SOLN
2.0000 g | Freq: Three times a day (TID) | INTRAVENOUS | Status: AC
  Administered 2021-11-05 (×2): 2 g via INTRAVENOUS
  Filled 2021-11-04 (×2): qty 100

## 2021-11-04 MED ORDER — DEXAMETHASONE SODIUM PHOSPHATE 10 MG/ML IJ SOLN
INTRAMUSCULAR | Status: AC
Start: 2021-11-04 — End: ?
  Filled 2021-11-04: qty 1

## 2021-11-04 MED ORDER — HYDRALAZINE HCL 20 MG/ML IJ SOLN
10.0000 mg | INTRAMUSCULAR | Status: DC | PRN
  Administered 2021-11-04: 10 mg via INTRAVENOUS
  Filled 2021-11-04: qty 1

## 2021-11-04 MED ORDER — THROMBIN 20000 UNITS EX SOLR
CUTANEOUS | Status: AC
  Filled 2021-11-04: qty 20000

## 2021-11-04 MED ORDER — DOCUSATE SODIUM 50 MG/5ML PO LIQD
100.0000 mg | Freq: Two times a day (BID) | ORAL | Status: DC
  Administered 2021-11-04 – 2021-11-07 (×6): 100 mg
  Filled 2021-11-04 (×5): qty 10

## 2021-11-04 MED ORDER — PROCHLORPERAZINE EDISYLATE 10 MG/2ML IJ SOLN: 5.0000 mg | Freq: Four times a day (QID) | INTRAMUSCULAR | Status: DC | PRN

## 2021-11-04 MED ORDER — FENTANYL CITRATE PF 50 MCG/ML IJ SOSY: 25.0000 ug | PREFILLED_SYRINGE | Freq: Once | INTRAMUSCULAR | Status: DC

## 2021-11-04 MED ORDER — LEVETIRACETAM IN NACL 500 MG/100ML IV SOLN
500.0000 mg | Freq: Two times a day (BID) | INTRAVENOUS | Status: DC
  Administered 2021-11-04 – 2021-11-07 (×6): 500 mg via INTRAVENOUS
  Filled 2021-11-04 (×9): qty 100

## 2021-11-04 MED ORDER — TRANEXAMIC ACID-NACL 1000-0.7 MG/100ML-% IV SOLN
1000.0000 mg | Freq: Once | INTRAVENOUS | Status: AC
  Administered 2021-11-04: 1000 mg via INTRAVENOUS

## 2021-11-04 MED ORDER — PROPOFOL 1000 MG/100ML IV EMUL
5.0000 ug/kg/min | INTRAVENOUS | Status: DC
  Administered 2021-11-04: 30 ug/kg/min via INTRAVENOUS
  Administered 2021-11-05: 15 ug/kg/min via INTRAVENOUS
  Filled 2021-11-04: qty 100

## 2021-11-04 MED ORDER — FENTANYL CITRATE (PF) 100 MCG/2ML IJ SOLN
INTRAMUSCULAR | Status: AC
  Filled 2021-11-04: qty 2

## 2021-11-04 MED ORDER — ONDANSETRON HCL 4 MG/2ML IJ SOLN
INTRAMUSCULAR | Status: AC
  Filled 2021-11-04: qty 2

## 2021-11-04 MED ORDER — THROMBIN 5000 UNITS EX SOLR
OROMUCOSAL | Status: DC | PRN
  Administered 2021-11-04: 5 mL via TOPICAL

## 2021-11-04 MED ORDER — ALBUMIN HUMAN 5 % IV SOLN: INTRAVENOUS | Status: DC | PRN

## 2021-11-04 MED ORDER — BACITRACIN ZINC 500 UNIT/GM EX OINT
TOPICAL_OINTMENT | CUTANEOUS | Status: AC
  Filled 2021-11-04: qty 28.35

## 2021-11-04 MED ORDER — SODIUM CHLORIDE 0.9 % IV SOLN: INTRAVENOUS | Status: DC | PRN

## 2021-11-04 MED ORDER — ROCURONIUM BROMIDE 10 MG/ML (PF) SYRINGE
PREFILLED_SYRINGE | INTRAVENOUS | Status: AC
Start: 2021-11-04 — End: ?
  Filled 2021-11-04: qty 10

## 2021-11-04 MED ORDER — FENTANYL 2500MCG IN NS 250ML (10MCG/ML) PREMIX INFUSION: 25.0000 ug/h | INTRAVENOUS | Status: DC

## 2021-11-04 MED ORDER — FENTANYL 2500MCG IN NS 250ML (10MCG/ML) PREMIX INFUSION
25.0000 ug/h | INTRAVENOUS | Status: DC
  Administered 2021-11-04: 100 ug/h via INTRAVENOUS
  Filled 2021-11-04: qty 250

## 2021-11-04 MED ORDER — FENTANYL CITRATE (PF) 250 MCG/5ML IJ SOLN
INTRAMUSCULAR | Status: DC | PRN
  Administered 2021-11-04: 50 ug via INTRAVENOUS

## 2021-11-04 MED ORDER — PHENYLEPHRINE 80 MCG/ML (10ML) SYRINGE FOR IV PUSH (FOR BLOOD PRESSURE SUPPORT)
PREFILLED_SYRINGE | INTRAVENOUS | Status: DC | PRN
  Administered 2021-11-04 (×3): 120 ug via INTRAVENOUS

## 2021-11-04 MED ORDER — ONDANSETRON HCL 4 MG/2ML IJ SOLN
INTRAMUSCULAR | Status: DC | PRN
  Administered 2021-11-04: 4 mg via INTRAVENOUS

## 2021-11-04 MED ORDER — HYDROMORPHONE HCL 1 MG/ML IJ SOLN: 0.5000 mg | INTRAMUSCULAR | Status: DC | PRN

## 2021-11-04 MED ORDER — FENTANYL CITRATE PF 50 MCG/ML IJ SOSY
100.0000 ug | PREFILLED_SYRINGE | Freq: Once | INTRAMUSCULAR | Status: AC
  Administered 2021-11-04: 100 ug via INTRAVENOUS

## 2021-11-04 MED ORDER — FENTANYL CITRATE (PF) 250 MCG/5ML IJ SOLN
INTRAMUSCULAR | Status: AC
  Filled 2021-11-04: qty 5

## 2021-11-04 MED ORDER — CEFAZOLIN SODIUM-DEXTROSE 2-3 GM-%(50ML) IV SOLR
INTRAVENOUS | Status: DC | PRN
  Administered 2021-11-04: 2 g via INTRAVENOUS

## 2021-11-04 MED ORDER — OXYCODONE HCL 5 MG/5ML PO SOLN: 5.0000 mg | ORAL | Status: DC | PRN

## 2021-11-04 MED ORDER — LABETALOL HCL 5 MG/ML IV SOLN
10.0000 mg | INTRAVENOUS | Status: DC | PRN
  Administered 2021-11-05 – 2021-11-07 (×2): 20 mg via INTRAVENOUS
  Filled 2021-11-04 (×2): qty 4

## 2021-11-04 MED ORDER — LIDOCAINE-EPINEPHRINE 1 %-1:100000 IJ SOLN
INTRAMUSCULAR | Status: AC
  Filled 2021-11-04: qty 1

## 2021-11-04 MED ORDER — ETOMIDATE 2 MG/ML IV SOLN
INTRAVENOUS | Status: AC | PRN
  Administered 2021-11-04: 15 mg via INTRAVENOUS

## 2021-11-04 MED ORDER — BISACODYL 10 MG RE SUPP: 10.0000 mg | Freq: Every day | RECTAL | Status: DC | PRN

## 2021-11-04 MED ORDER — THROMBIN 20000 UNITS EX SOLR
CUTANEOUS | Status: DC | PRN
  Administered 2021-11-04: 20 mL via TOPICAL

## 2021-11-04 MED ORDER — ONDANSETRON HCL 4 MG/2ML IJ SOLN: 4.0000 mg | Freq: Four times a day (QID) | INTRAMUSCULAR | Status: DC | PRN

## 2021-11-04 MED ORDER — PROCHLORPERAZINE MALEATE 10 MG PO TABS: 10.0000 mg | ORAL_TABLET | Freq: Four times a day (QID) | ORAL | Status: DC | PRN

## 2021-11-04 MED ORDER — POLYETHYLENE GLYCOL 3350 17 G PO PACK
17.0000 g | PACK | Freq: Every day | ORAL | Status: DC
  Administered 2021-11-06 – 2021-11-07 (×2): 17 g
  Filled 2021-11-04 (×2): qty 1

## 2021-11-04 MED ORDER — PROMETHAZINE HCL 25 MG PO TABS: 12.5000 mg | ORAL_TABLET | ORAL | Status: DC | PRN

## 2021-11-04 MED ORDER — PROPOFOL 1000 MG/100ML IV EMUL: 0.0000 ug/kg/min | INTRAVENOUS | Status: DC

## 2021-11-04 MED ORDER — FENTANYL CITRATE PF 50 MCG/ML IJ SOSY: 50.0000 ug | PREFILLED_SYRINGE | Freq: Once | INTRAMUSCULAR | Status: AC

## 2021-11-04 MED ORDER — LIDOCAINE-EPINEPHRINE 1 %-1:100000 IJ SOLN
INTRAMUSCULAR | Status: DC | PRN
  Administered 2021-11-04: 10 mL

## 2021-11-04 MED ORDER — SODIUM CHLORIDE 0.9% IV SOLUTION: Freq: Once | INTRAVENOUS | Status: DC

## 2021-11-04 MED ORDER — ORAL CARE MOUTH RINSE
15.0000 mL | OROMUCOSAL | Status: DC
Start: 2021-11-04 — End: 2021-11-05
  Administered 2021-11-04 – 2021-11-05 (×6): 15 mL via OROMUCOSAL

## 2021-11-04 MED ORDER — INSULIN ASPART 100 UNIT/ML IJ SOLN
0.0000 [IU] | INTRAMUSCULAR | Status: DC
  Administered 2021-11-04 – 2021-11-05 (×2): 3 [IU] via SUBCUTANEOUS
  Administered 2021-11-05 – 2021-11-07 (×8): 2 [IU] via SUBCUTANEOUS

## 2021-11-04 MED ORDER — IOHEXOL 350 MG/ML SOLN
100.0000 mL | Freq: Once | INTRAVENOUS | Status: AC | PRN
  Administered 2021-11-04: 100 mL via INTRAVENOUS

## 2021-11-04 MED ORDER — ACETAMINOPHEN 160 MG/5ML PO SOLN: 500.0000 mg | Freq: Four times a day (QID) | ORAL | Status: DC | PRN

## 2021-11-04 MED ORDER — DOCUSATE SODIUM 50 MG/5ML PO LIQD: 100.0000 mg | Freq: Two times a day (BID) | ORAL | Status: DC

## 2021-11-04 SURGICAL SUPPLY — 69 items
ADH SKN CLS APL DERMABOND .7 (GAUZE/BANDAGES/DRESSINGS)
BAG COUNTER SPONGE SURGICOUNT (BAG) ×1 IMPLANT
BAG DECANTER FOR FLEXI CONT (MISCELLANEOUS) ×1 IMPLANT
BAG SPNG CNTER NS LX DISP (BAG) ×1
BIT DRILL WIRE PASS 1.3MM (BIT) ×1 IMPLANT
BLADE SURG 11 STRL SS (BLADE) IMPLANT
BNDG CMPR 75X41 PLY HI ABS (GAUZE/BANDAGES/DRESSINGS)
BNDG COHESIVE 4X5 TAN STRL (GAUZE/BANDAGES/DRESSINGS) IMPLANT
BNDG STRETCH 4X75 STRL LF (GAUZE/BANDAGES/DRESSINGS) IMPLANT
BUR ACORN 6.0 PRECISION (BURR) ×1 IMPLANT
BUR SPIRAL ROUTER 2.3 (BUR) IMPLANT
CANISTER SUCT 3000ML PPV (MISCELLANEOUS) ×1 IMPLANT
CARTRIDGE OIL MAESTRO DRILL (MISCELLANEOUS) ×1 IMPLANT
CLIP VESOCCLUDE MED 6/CT (CLIP) IMPLANT
COVER BACK TABLE 60X90IN (DRAPES) ×2 IMPLANT
DERMABOND ADVANCED (GAUZE/BANDAGES/DRESSINGS)
DERMABOND ADVANCED .7 DNX12 (GAUZE/BANDAGES/DRESSINGS) IMPLANT
DIFFUSER DRILL AIR PNEUMATIC (MISCELLANEOUS) ×1 IMPLANT
DRAIN CHANNEL 10M FLAT 3/4 FLT (DRAIN) IMPLANT
DRAPE MICROSCOPE LEICA (MISCELLANEOUS) IMPLANT
DRAPE NEUROLOGICAL W/INCISE (DRAPES) ×1 IMPLANT
DRAPE SURG 17X23 STRL (DRAPES) IMPLANT
DRAPE WARM FLUID 44X44 (DRAPES) ×1 IMPLANT
DRILL WIRE PASS 1.3MM (BIT) ×1
ELECT REM PT RETURN 9FT ADLT (ELECTROSURGICAL) ×1
ELECTRODE REM PT RTRN 9FT ADLT (ELECTROSURGICAL) ×1 IMPLANT
EVACUATOR SILICONE 100CC (DRAIN) IMPLANT
GAUZE 4X4 16PLY ~~LOC~~+RFID DBL (SPONGE) IMPLANT
GAUZE SPONGE 4X4 12PLY STRL (GAUZE/BANDAGES/DRESSINGS) ×1 IMPLANT
GLOVE BIO SURGEON STRL SZ 6.5 (GLOVE) ×1 IMPLANT
GLOVE BIOGEL PI IND STRL 6.5 (GLOVE) ×1 IMPLANT
GLOVE BIOGEL PI INDICATOR 6.5 (GLOVE) ×2
GLOVE ECLIPSE 9.0 STRL (GLOVE) ×1 IMPLANT
GLOVE EXAM NITRILE XL STR (GLOVE) IMPLANT
GOWN STRL REUS W/ TWL LRG LVL3 (GOWN DISPOSABLE) IMPLANT
GOWN STRL REUS W/ TWL XL LVL3 (GOWN DISPOSABLE) IMPLANT
GOWN STRL REUS W/TWL 2XL LVL3 (GOWN DISPOSABLE) IMPLANT
GOWN STRL REUS W/TWL LRG LVL3 (GOWN DISPOSABLE) ×2
GOWN STRL REUS W/TWL XL LVL3 (GOWN DISPOSABLE) ×2
HEMOSTAT POWDER KIT SURGIFOAM (HEMOSTASIS) IMPLANT
HEMOSTAT SURGICEL 2X14 (HEMOSTASIS) IMPLANT
KIT BASIN OR (CUSTOM PROCEDURE TRAY) ×1 IMPLANT
KIT TURNOVER KIT B (KITS) ×1 IMPLANT
NDL HYPO 25X1 1.5 SAFETY (NEEDLE) ×1 IMPLANT
NEEDLE HYPO 25X1 1.5 SAFETY (NEEDLE) ×1 IMPLANT
NS IRRIG 1000ML POUR BTL (IV SOLUTION) ×1 IMPLANT
OIL CARTRIDGE MAESTRO DRILL (MISCELLANEOUS) ×1
PACK CRANIOTOMY CUSTOM (CUSTOM PROCEDURE TRAY) ×1 IMPLANT
PAD ARMBOARD 7.5X6 YLW CONV (MISCELLANEOUS) ×1 IMPLANT
PATTIES SURGICAL .25X.25 (GAUZE/BANDAGES/DRESSINGS) IMPLANT
PATTIES SURGICAL .5 X.5 (GAUZE/BANDAGES/DRESSINGS) IMPLANT
PATTIES SURGICAL .5 X3 (DISPOSABLE) IMPLANT
PATTIES SURGICAL 1X1 (DISPOSABLE) IMPLANT
PIN MAYFIELD SKULL DISP (PIN) IMPLANT
PLATE BONE 12 2H TARGET XL (Plate) IMPLANT
SCREW UNIII AXS SD 1.5X4 (Screw) IMPLANT
SPONGE NEURO XRAY DETECT 1X3 (DISPOSABLE) IMPLANT
SPONGE SURGIFOAM ABS GEL 100 (HEMOSTASIS) ×1 IMPLANT
SPONGE T-LAP 18X18 ~~LOC~~+RFID (SPONGE) IMPLANT
STAPLER VISISTAT 35W (STAPLE) ×1 IMPLANT
STOCKINETTE TUBULAR 6 INCH (GAUZE/BANDAGES/DRESSINGS) IMPLANT
SUT NURALON 4 0 TR CR/8 (SUTURE) ×2 IMPLANT
SUT VIC AB 2-0 CT2 18 VCP726D (SUTURE) ×2 IMPLANT
TAPE CLOTH SURG 4X10 WHT LF (GAUZE/BANDAGES/DRESSINGS) IMPLANT
TOWEL GREEN STERILE (TOWEL DISPOSABLE) ×1 IMPLANT
TOWEL GREEN STERILE FF (TOWEL DISPOSABLE) ×1 IMPLANT
TRAY FOLEY MTR SLVR 16FR STAT (SET/KITS/TRAYS/PACK) IMPLANT
UNDERPAD 30X36 HEAVY ABSORB (UNDERPADS AND DIAPERS) IMPLANT
WATER STERILE IRR 1000ML POUR (IV SOLUTION) ×1 IMPLANT

## 2021-11-04 NOTE — Progress Notes (Signed)
   11/24/21 1455  Clinical Encounter Type  Visited With Family  Visit Type Initial;Trauma  Referral From Nurse  Consult/Referral To Chaplain   Chaplain responded to a level one trauma. Patient was under the care of the medical team.  Family member arrived and received an update from the medical team. I visited with her to access that she was ok. She assured me she was and that she was reaching out to the rest of the family including the patient's husband.    Valerie Roys [facility]  [phone]

## 2021-11-04 NOTE — ED Provider Notes (Signed)
[facility] EMERGENCY DEPARTMENT Provider Note   CSN: [id number] Arrival date & time: 11/09/2021  1502     History  Chief Complaint  Patient presents with   Trauma    Jacqueline Richmond is a 76 y.o. female BIBEMS as level 1 trauma activation after GLF.  Per EMS, patient had unwitnessed fall, when she hit her head on unknown object.  Patient is on Plavix.  Patient vomited multiple times in route, there is concern for possible aspiration.  Patient has been unresponsive since the fall.  Home Medications Prior to Admission medications   Not on File      Allergies    Patient has no allergy information on record.    Review of Systems   Review of Systems Unable to obtain given patient is unresponsive.  Physical Exam Updated Vital Signs BP 130/85 (BP Location: Left Arm)   Pulse (!) 104   Resp 16   SpO2 100%  Physical Exam Constitutional:      Comments: Unresponsive elderly female with hard collar in place.  There are pressure dressings around her head, with strike through bleeding from right parietal area of skull.  Eyes:     Comments: Left pupil 22m, sluggishly reactive. Right pupil 5 mm, nonreactive.  Cardiovascular:     Rate and Rhythm: Tachycardia present.  Pulmonary:     Comments: Bilateral breath sounds present. Abdominal:     General: Abdomen is flat. There is no distension.     Palpations: Abdomen is soft.     Tenderness: There is no abdominal tenderness.  Musculoskeletal:     Comments: Mass noted to right hip.    ED Results / Procedures / Treatments   Labs (all labs ordered are listed, but only abnormal results are displayed) Labs Reviewed  CBC - Abnormal; Notable for the following components:      Result Value   WBC 11.2 (*)    Hemoglobin 10.0 (*)    HCT 33.0 (*)    MCH 24.6 (*)    All other components within normal limits  COMPREHENSIVE METABOLIC PANEL  ETHANOL  URINALYSIS, ROUTINE W REFLEX MICROSCOPIC  LACTIC ACID, PLASMA  PROTIME-INR   I-STAT CHEM 8, ED  SAMPLE TO BLOOD BANK    EKG None  Radiology No results found.  Procedures .Critical Care  Performed by: RFaylene Million MD Authorized by: MParticia Jasper MD   Critical care provider statement:    Critical care time (minutes):  45   Critical care time was exclusive of:  Separately billable procedures and treating other patients and teaching time   Critical care was necessary to treat or prevent imminent or life-threatening deterioration of the following conditions:  CNS failure or compromise and trauma   Critical care was time spent personally by me on the following activities:  Development of treatment plan with patient or surrogate, discussions with consultants, evaluation of patient's response to treatment, examination of patient, obtaining history from patient or surrogate, ventilator management, ordering and performing treatments and interventions, ordering and review of laboratory studies, ordering and review of radiographic studies, pulse oximetry and re-evaluation of patient's condition   Care discussed with: admitting provider   Procedure Name: Intubation Date/Time: 11/15/2021 4:28 PM  Performed by: RFaylene Million MDPre-anesthesia Checklist: Patient identified, Emergency Drugs available, Suction available and Patient being monitored Oxygen Delivery Method: Ambu bag Preoxygenation: Pre-oxygenation with 100% oxygen Induction Type: Rapid sequence Laryngoscope Size: Glidescope, Mac and 3 Grade View: Grade I Tube size: 7.5 mm Number  of attempts: 1 Airway Equipment and Method: Patient positioned with wedge pillow, Video-laryngoscopy and Rigid stylet Placement Confirmation: ETT inserted through vocal cords under direct vision, CO2 detector and Breath sounds checked- equal and bilateral Secured at: 23 cm Tube secured with: ETT holder Dental Injury: Teeth and Oropharynx as per pre-operative assessment        Medications Ordered in ED Medications  fentaNYL  (SUBLIMAZE) 100 MCG/2ML injection (has no administration in time range)  fentaNYL (SUBLIMAZE) 50 MCG/ML injection (has no administration in time range)    ED Course/ Medical Decision Making/ A&P                           Medical Decision Making Risk Prescription drug management. Decision regarding hospitalization.    Medical Decision Making:  Jacqueline Richmond is a 76 y.o. female on plavix, who presented to the ED today with a level 1 trauma.     Trauma Surgery team at bedside upon patient arrival. Reviewed and confirmed nursing documentation for past medical history, family history, social history.   Initial Assessment:  Primary survey: Patient with good O2 but not protecting airway due to mental status. Patient intubated by me as above.  BL breath sounds present.  Circulation established with WNL BP, 2 large bore IVs, and radial/femoral pulses.  Disability evaluation negative. No obvious disability requiring intervention.  Patient fully exposed and all injuries were noted, any penetrating injuries were labeled with radiopaque markers. Patient stable for CXR that demonstrated no traumatic hemopneumothorax and PXR that demonstrated no unstable pelvic fractures. EFAST performed: negative.  Secondary survey: Patient fully exposed and secondary survey was performed.  See concurrent documentation for further information. In summary: patient had obvious injury to scalp with pulsatile arterial blood flow seen from the right parietal region.  Staples were placed, with improvement in bleeding. Patient stable for transfer to CT scanner for further traumatic evaluation.   Trauma scans obtained, resulted notable for bilateral subdural hemorrhage right greater than left with uncal herniation, right posterior ninth rib fracture with a small hemothorax. Consults were necessary, including trauma and neurosurgery.  Final Assessment and Plan:  Patient to be admitted to ICU level care for additional  treatment of her injuries.  Clinical Impression:  1. Trauma     Final Clinical Impression(s) / ED Diagnoses Final diagnoses:  Trauma    Rx / DC Orders ED Discharge Orders     None         Faylene Million, MD 11/11/2021 1722    Elnora Morrison, MD 10/29/2021 [phone]

## 2021-11-04 NOTE — ED Notes (Signed)
Transported to CT with TRN and Trauma MD

## 2021-11-04 NOTE — Anesthesia Procedure Notes (Signed)
Arterial Line Insertion Start/End09/01/2022 5:30 PM, 12/02/21 5:35 PM Performed by: Lelon Perla, CRNA, CRNA  Patient location: Pre-op. Preanesthetic checklist: patient identified, IV checked, site marked, risks and benefits discussed, surgical consent, monitors and equipment checked, pre-op evaluation, timeout performed and anesthesia consent Lidocaine 1% used for infiltration Right, radial was placed Catheter size: 20 G Hand hygiene performed  and maximum sterile barriers used   Attempts: 1 Procedure performed without using ultrasound guided technique. Following insertion, dressing applied and Biopatch. Post procedure assessment: normal and unchanged  Patient tolerated the procedure well with no immediate complications.

## 2021-11-04 NOTE — Op Note (Signed)
Date of procedure: 11/12/2021  Date of dictation: Same  Service: Neurosurgery  Preoperative diagnosis: Acute left subdural hematoma with transtentorial and subfalcine herniation, posttraumatic  Postoperative diagnosis: Same  Procedure Name: Emergent left frontotemporoparietal craniotomy and evacuation of subdural hematoma  Surgeon:Heath Badon A.Douglas Rooks, M.D.  Asst. Surgeon: Doran Durand, NP  Anesthesia: General  Indication: 76 year old female status post fall at nursing home.  Patient unconscious at scene.  Transferred to [facility].  Intubated.  Head CT scan demonstrates evidence of a large convexity subdural hematoma with mass effect.  Patient presents now for emergent surgery.  Operative note: After induction anesthesia, patient position in the right lateral decubitus position secondary to very poor neck mobility and positional difficulties.  Patient was appropriately padded.  Patient's left scalp was prepped and draped sterilely.  A curvilinear incision was performed extending from the zygoma over her right temporal parietal and frontal regions.  Scalp flap and temporalis muscle were reflected anteriorly and held in place with traction sutures.  A left frontotemporoparietal craniotomy was then performed using high-speed drill.  The middle fossa was decompressed using Leksell rongeurs.  The sphenoid wing was further resected.  Dura was opened.  Acute subdural was evacuated.  Using gentle suction the subdural was removed from the periphery.  There was a small area of active bleeding from a cortical artery which was controlled with very minimal bipolar ring saving the parent vessel.  There is no evidence of any other areas of active bleeding.  The wound was irrigated.  A 10 mm Blake drain was left in the subdural space.  The dura was loosely reapproximated.  Gelfoam was placed over the dura.  Bone flap was reapproximated with titanium plates and the scalp was closed with 2-0 Vicryl suture the galea  and staples in the surface.  No apparent complications.  Patient tolerated the procedure well.  She returned to the intensive care unit in critical condition.

## 2021-11-04 NOTE — Anesthesia Postprocedure Evaluation (Signed)
Anesthesia Post Note  Patient: Jacqueline Richmond  Procedure(s) Performed: LEFT CRANIOTOMY HEMATOMA EVACUATION SUBDURAL (Left: Head)     Patient location during evaluation: SICU Anesthesia Type: General Level of consciousness: sedated Pain management: pain level controlled Vital Signs Assessment: post-procedure vital signs reviewed and stable Respiratory status: patient remains intubated per anesthesia plan Cardiovascular status: stable Postop Assessment: no apparent nausea or vomiting Anesthetic complications: no   No notable events documented.  Last Vitals:  Vitals:   11/09/21 1919 09-Nov-2021 2000  BP: 134/70 (!) 141/70  Pulse: 74 73  Resp: (!) 22 (!) 22  Temp:    SpO2: 100% 100%    Last Pain:  Vitals:   09-Nov-2021 1659  TempSrc: Bladder                 Azaryah Heathcock P Johnattan Strassman

## 2021-11-04 NOTE — Transfer of Care (Signed)
Immediate Anesthesia Transfer of Care Note  Patient: Jacqueline Richmond  Procedure(s) Performed: LEFT CRANIOTOMY HEMATOMA EVACUATION SUBDURAL (Left: Head)  Patient Location: ICU  Anesthesia Type:General  Level of Consciousness: Patient remains intubated per anesthesia plan  Airway & Oxygen Therapy: Patient remains intubated per anesthesia plan and Patient placed on Ventilator (see vital sign flow sheet for setting)  Post-op Assessment: Report given to RN and Post -op Vital signs reviewed and stable  Post vital signs: Reviewed and stable  Last Vitals:  Vitals Value Taken Time  BP 99/57 10/26/2021 1930  Temp    Pulse 75 11/21/2021 1931  Resp 12 11/10/2021 1931  SpO2 100 % 10/31/2021 1931  Vitals shown include unvalidated device data.  Last Pain:  Vitals:   11/21/2021 1659  TempSrc: Bladder         Complications: No notable events documented.

## 2021-11-04 NOTE — Progress Notes (Signed)
Patient was intubated by ED MD without complications.  Patient noted to be difficult airway due to stiff neck.  Positive color change noted.  Bilateral breath sounds auscultated.  Chest xray obtained for confirmation of tube placement.  Once patient was intubated, patient was then transported to CT and back to trauma room B.  Will continue to monitor.

## 2021-11-04 NOTE — ED Notes (Signed)
Pt transported to OR at this time  

## 2021-11-04 NOTE — Progress Notes (Signed)
Post intubation ABG obtained on ventilator settings of VT: 340, RR: 18, FIO2: 100%, and PEEP: 5.0.  Increased RR to 22 and decreased FIO2 to 40%.  Will continue to monitor.    Latest Reference Range & Units Dec 01, 2021 16:44  Sample type  ARTERIAL  pH, Arterial 7.35 - 7.45  7.300 (L)  pCO2 arterial 32 - 48 mmHg 52.7 (H)  pO2, Arterial 83 - 108 mmHg 479 (H)  TCO2 22 - 32 mmol/L 28  Acid-base deficit 0.0 - 2.0 mmol/L 1.0  Bicarbonate 20.0 - 28.0 mmol/L 25.9  O2 Saturation % 100  Patient temperature  98.6 F  Collection site  RADIAL, ALLEN'S TEST ACCEPTABLE

## 2021-11-04 NOTE — H&P (Addendum)
[facility] Surgery Admission Note  [facility] Oct 09, 1945  [id number].    Requesting MD: Blane Ohara Chief Complaint/Reason for Consult: fall  HPI:  Jacqueline Richmond is a 76 y.o. female with multiple medical issues including PAD with LE stents on plavix, CKD, HTN, DM, who was brought into [facility] via EMS from SNF as a level 1 trauma after suffering a fall. Per EMS patient had an unwitnessed but audible fall.  She was unresponsive with significant bleeding from the scalp. Hypertensive en route and vomited x3. In the trauma bay GCS 7 (E1V1M5). Intubated by EDP. Taken to the scanner. TXA ordered.  Does ambulate some with walker LE wounds healed  No family history on file.  No past medical history on file.  Social History:  has no history on file for tobacco use, alcohol use, and drug use.  Allergies: Not on File  Meds: (from other chart pre trauma) allopurinol (ZYLOPRIM) 300 MG tablet aspirin EC 81 MG tablet  atenolol (TENORMIN) 50 MG tablet (Expired) CINNAMON PO clopidogrel (PLAVIX) 75 MG tablet  gabapentin (NEURONTIN) 100 MG capsule GARLIC PO  insulin aspart (NOVOLOG) 100 UNIT/ML injection ipratropium (ATROVENT) 0.06 % nasal spray loratadine (CLARITIN) 10 MG tablet NIACIN PO  sitaGLIPtin (JANUVIA) 50 MG tablet sodium bicarbonate 650 MG tablet VITAMIN D PO    Blood pressure 98/62, pulse 100, resp. rate 18, height 4\' 11"  (1.499 m), weight 65.8 kg, SpO2 100 %.  Physical Exam: General: elderly female HEENT: pulsatile bleeding from right temporal scalp lac. Left pupil sluggish 6mm, Right pupil nonreactive 5mm.   Heart: tachycardic, regular rhythm.  Palpable radial and pedal pulses bilaterally  Lungs: breath sounds bilaterally Abd: soft, NT/ND, +BS, no masses, hernias, or organomegaly MS: no BUE/BLE edema, calves soft and nontender. Mass noted to the right hip Skin: warm and dry with no masses, lesions, or rashes Psych: unresponsive Neuro: ultimately did  MAEs  Results for orders placed or performed during the hospital encounter of 2021-11-23 (from the past 48 hour(s))  Sample to Blood Bank     Status: None   Collection Time: Nov 23, 2021  3:08 PM  Result Value Ref Range   Blood Bank Specimen SAMPLE AVAILABLE FOR TESTING    Sample Expiration      11/05/2021,2359 Performed at [facility], [address], [place], [place] [address]   Comprehensive metabolic panel     Status: Abnormal   Collection Time: 2021/11/23  3:18 PM  Result Value Ref Range   Sodium 139 135 - 145 mmol/L   Potassium 3.6 3.5 - 5.1 mmol/L   Chloride 109 98 - 111 mmol/L   CO2 24 22 - 32 mmol/L   Glucose, Bld 194 (H) 70 - 99 mg/dL    Comment: Glucose reference range applies only to samples taken after fasting for at least 8 hours.   BUN 35 (H) 8 - 23 mg/dL   Creatinine, Ser 1.91 (H) 0.44 - 1.00 mg/dL   Calcium 8.6 (L) 8.9 - 10.3 mg/dL   Total Protein 6.0 (L) 6.5 - 8.1 g/dL   Albumin 2.7 (L) 3.5 - 5.0 g/dL   AST 32 15 - 41 U/L   ALT 17 0 - 44 U/L   Alkaline Phosphatase 108 38 - 126 U/L   Total Bilirubin 0.5 0.3 - 1.2 mg/dL   GFR, Estimated 29 (L) >60 mL/min    Comment: (NOTE) Calculated using the CKD-EPI Creatinine Equation (2021)    Anion gap 6 5 - 15  Comment: Performed at [facility], [address] [place], [place] [address]  CBC     Status: Abnormal   Collection Time: 2021-11-18  3:18 PM  Result Value Ref Range   WBC 11.2 (H) 4.0 - 10.5 K/uL   RBC 4.07 3.87 - 5.11 MIL/uL   Hemoglobin 10.0 (L) 12.0 - 15.0 g/dL   HCT 25.9 (L) 56.3 - 87.5 %   MCV 81.1 80.0 - 100.0 fL   MCH 24.6 (L) 26.0 - 34.0 pg   MCHC 30.3 30.0 - 36.0 g/dL   RDW 64.3 32.9 - 51.8 %   Platelets 273 150 - 400 K/uL   nRBC 0.0 0.0 - 0.2 %    Comment: Performed at [facility], [address], [place], [place] [address]  Protime-INR     Status: None   Collection Time: 11-18-21  3:18 PM  Result Value Ref Range   Prothrombin Time 14.8 11.4 - 15.2 seconds   INR 1.2 0.8 - 1.2     Comment: (NOTE) INR goal varies based on device and disease states. Performed at [facility], [address] [place], [place] [address]   Ethanol     Status: None   Collection Time: 11/18/2021  3:19 PM  Result Value Ref Range   Alcohol, Ethyl (B) <10 <10 mg/dL    Comment: (NOTE) Lowest detectable limit for serum alcohol is 10 mg/dL.  For medical purposes only. Performed at [facility], [address], [place], [place] [address]   Lactic acid, plasma     Status: Abnormal   Collection Time: 11-18-21  3:20 PM  Result Value Ref Range   Lactic Acid, Venous 2.1 (HH) 0.5 - 1.9 mmol/L    Comment: CRITICAL RESULT CALLED TO, READ BACK BY AND VERIFIED WITH E.DIXON,RN @1558  11/18/21 [email or web address].J Performed at [facility], [address] [place], [place] [address]    DG Pelvis Portable  Result Date: 2021-11-18 CLINICAL DATA:  Trauma EXAM: PORTABLE PELVIS 1-2 VIEWS COMPARISON:  X-ray pelvis 06/03/2021 FINDINGS: Limited evaluation due to overlapping osseous structures and overlying soft tissues. Total right hip arthroplasty partially visualized. There is no evidence of pelvic fracture or diastasis. No evidence of acute displaced fracture or dislocation of the left hip on frontal view. No pelvic bone lesions are seen. Visualized lower lumbar spine demonstrates severe degenerative changes. Marked vascular calcifications. IMPRESSION: Negative for acute traumatic injury in a patient status post total right hip arthroplasty. Electronically Signed   By: Tish Frederickson M.D.   On: 18-Nov-2021 15:41   DG Chest Port 1 View  Result Date: 11/18/2021 CLINICAL DATA:  Trauma EXAM: PORTABLE CHEST 1 VIEW COMPARISON:  CT chest, abdomen, pelvis 11/18/2021 FINDINGS: Endotracheal tube terminates 3.5 cm above the carina. Enteric tube courses below the hemidiaphragm with tip overlying the expected region of the gastric lumen and side port in the region of the gastroesophageal junction. The heart and  mediastinal contours are within normal limits. Atherosclerotic plaque. No focal consolidation. No pulmonary edema. At least trace to small volume right pleural effusion. No pneumothorax. No acute osseous abnormality.  Thoracolumbar surgical hardware. IMPRESSION: 1. At least trace to small volume right pleural effusion. 2. Enteric tube courses below the hemidiaphragm with tip overlying the expected region of the gastric lumen and side port in the region of the gastroesophageal junction. Consider advancing by 3 cm. 3.  Aortic Atherosclerosis (ICD10-I70.0). Electronically Signed   By: Tish Frederickson M.D.   On:  11/04/2021 15:39      Assessment/Plan Fall VDRF Bilateral SDH R>L with uncal herniation - NSGY c/s called, Dr. Jordan Likes coming to see R posterior 9th rib fx with small HTX - multimodal pain control and pulm toilet. Repeat CXR in AM Scalp laceration - plan to reasses CKD stage IV HTN DM-2 PAD s/p stents on plavix H/o Deconditioning/ Functional quadriplegia March 2023- improved.     ID - none VTE - SCDs only FEN - IVF, NPO/OG Foley - placed in trauma bay  Dispo - Admit to 4N ICU. NSGY consult.  I reviewed last 24 h vitals and pain scores, last 48 h intake and output, last 24 h labs and trends, and last 24 h imaging results   Carlena Bjornstad, [facility] Surgery 11/04/2021, 4:08 PM Please see Amion for pager number during day hours 7:00am-4:30pm  Seen, agree with above. I was at bedside throughout encounter. Baseline function - at SNF with some ambulation. Supposed to ambulate with walker; pt frequently ambulates without assistive aid.   Neurosurgery at bedside by 4:05. Discussed with daughter in law who works in ED. Family to discuss whether surgical intervention is to be pursued.   46 min cc time for vent/sedation management as well as HTN/Bradycardia.   I personally saw the patient and performed a substantive portion of this encounter, including a complete performance of  at least one of the key components (MDM, Hx and/or Exam), in conjunction with the Advanced Practice Provider Carlena Bjornstad, PA-C.    Maudry Diego, MD FACS Surgical Oncology, General Surgery, Trauma and Critical Telecare [facility], [place] [phone] for weekday/non holidays Check [email or web address] for coverage night/weekend/holidays  Do not use SecureChat as it is not reliable for timely patient care.

## 2021-11-04 NOTE — Brief Op Note (Signed)
11/23/21  6:48 PM  PATIENT:  Jacqueline Richmond  76 y.o. female  PRE-OPERATIVE DIAGNOSIS:  Left Subdural Hematoma  POST-OPERATIVE DIAGNOSIS:  Left Subdural Hematoma  PROCEDURE:  Procedure(s): LEFT CRANIOTOMY HEMATOMA EVACUATION SUBDURAL (Left)  SURGEON:  Surgeon(s) and Role:    Julio Sicks, MD - Primary  PHYSICIAN ASSISTANT:   ASSISTANTSMarland Mcalpine   ANESTHESIA:   general  EBL:  250 mL   BLOOD ADMINISTERED:none  DRAINS: (57mm) Blake drain(s) in the subdural space    LOCAL MEDICATIONS USED:  LIDOCAINE   SPECIMEN:  No Specimen  DISPOSITION OF SPECIMEN:  N/A  COUNTS:  YES  TOURNIQUET:  * No tourniquets in log *  DICTATION: .Dragon Dictation  PLAN OF CARE: Admit to inpatient   PATIENT DISPOSITION:  ICU - intubated and critically ill.   Delay start of Pharmacological VTE agent (>24hrs) due to surgical blood loss or risk of bleeding: yes

## 2021-11-04 NOTE — ED Notes (Addendum)
Trauma Response Nurse Documentation  Jacqueline Richmond is a 76 y.o. female arriving to [facility] ED via EMS  On clopidogrel 75 mg daily. Trauma was activated as a Level 2 based on the following trauma criteria GCS < 9. Trauma team at the bedside on patient arrival. Patient cleared for CT by Dr. Jodi Mourning. Patient to CT with team. GCS 6.  Patient from [facility], per facility they heard patient fall and then found her on the floor with large laceration. Unsure how she fell and what she hit her head on.  History   History reviewed. No pertinent past medical history.   History reviewed. No pertinent surgical history.   Initial Focused Assessment (If applicable, or please see trauma documentation): A&Ox4, GCS 6, Pupils R>L sluggish Large laceration to the right side of head, bleeding controlled with multiple staples Airway with secretions - planned intubation No other injury noted during assessment  CT's Completed:   CT Head, CT C-Spine, CT Chest w/ contrast, and CT abdomen/pelvis w/ contrast   Interventions:  IV, trauma labs Patient unresponsive, intubated shortly after arrival OG tube placed CXR/PXR CT Head/Cspine/C/A/P NSG consulted TXA Prop/Fent Plans for surgery  Plan for disposition:  Admission to ICU after craniotomy by Dr Jordan Likes.  Consults completed:  Neurosurgeon at 15[address] NP [place].  Event Summary: Patient daughter in law of patient in person with son on the phone. Son gave permission for daughter in law, Victorino Dike to sign consent form for surgery. Patient transported to surgery by TRN and RRT.  Bedside handoff with OR RN and CRNA.    Jill Side Jamieka Royle  Trauma Response RN  Please call TRN at [phone] for further assistance.

## 2021-11-04 NOTE — Anesthesia Preprocedure Evaluation (Addendum)
Anesthesia Evaluation  Patient identified by MRN, date of birth, ID band Patient unresponsive  General Assessment Comment:female who has been residing in a nursing home as she recovers from vascular surgery.  She suffered an unwitnessed fall.  Patient unresponsive at the scene with active scalp bleeding.  Patient arrived still not responsive to voice or command.  Intubated for airway protection.  Work-up demonstrates evidence of a small right-sided chronic appearing subdural hematoma.  On the left side she has an acute on chronic subdural hematoma with marked mass effect and evidence of early transtentorial herniation and subfalcine herniation.  Reviewed: Patient's Chart, lab work & pertinent test resultsPreop documentation limited or incomplete due to emergent nature of procedure.  Airway Mallampati: Intubated  TM Distance: >3 FB Neck ROM: Full    Dental no notable dental hx.    Pulmonary neg pulmonary ROS,    Pulmonary exam normal breath sounds clear to auscultation       Cardiovascular + Peripheral Vascular Disease  Normal cardiovascular exam Rhythm:Regular Rate:Normal     Neuro/Psych negative neurological ROS  negative psych ROS   GI/Hepatic negative GI ROS, Neg liver ROS,   Endo/Other  diabetes, Type 2  Renal/GU Renal InsufficiencyRenal disease  negative genitourinary   Musculoskeletal negative musculoskeletal ROS (+)   Abdominal   Peds negative pediatric ROS (+)  Hematology  (+) Blood dyscrasia, anemia , On plavix   Anesthesia Other Findings   Reproductive/Obstetrics negative OB ROS                            Anesthesia Physical Anesthesia Plan  ASA: 4 and emergent  Anesthesia Plan: General   Post-op Pain Management: Minimal or no pain anticipated   Induction: Intravenous  PONV Risk Score and Plan: 3 and Ondansetron and Treatment may vary due to age or medical condition  Airway  Management Planned: Oral ETT  Additional Equipment: Arterial line  Intra-op Plan:   Post-operative Plan: Post-operative intubation/ventilation  Informed Consent: I have reviewed the patients History and Physical, chart, labs and discussed the procedure including the risks, benefits and alternatives for the proposed anesthesia with the patient or authorized representative who has indicated his/her understanding and acceptance.     Dental advisory given  Plan Discussed with: CRNA and Surgeon  Anesthesia Plan Comments:         Anesthesia Quick Evaluation

## 2021-11-04 NOTE — Progress Notes (Signed)
Patient transported from ED to OR, on ventilator, without complications.

## 2021-11-04 NOTE — Progress Notes (Signed)
Orthopedic Tech Progress Note Patient Details:  Jacqueline Richmond 1946/01/04 [id number] Level 1 Trauma. Not needed Patient ID: MEKAYLAH [name], female   DOB: [birth year], 76 y.o.   MRN: [id number]  Lovett Calender 10/28/2021, 3:28 PM

## 2021-11-04 NOTE — ED Triage Notes (Signed)
Patient presents to ed via [facility] from [facility], per ems she was an unwitnessed fall hitting her head on unknown object. Profuse bleeding from right parietal side of her head, pressure dressing applied. Patient is on Plavix. Per ems patient vomited x 3 enroute. Poss. Aspiration . Moves all ext. Bilateral BS present. Patient is unresp.

## 2021-11-04 NOTE — Consult Note (Signed)
Reason for Consult: Subdural hematoma Referring Physician: Emergency department  Jacqueline Richmond is an 76 y.o. female.  HPI: 48-year-old female who has been residing in a nursing home as she recovers from vascular surgery.  She suffered an unwitnessed fall.  Patient unresponsive at the scene with active scalp bleeding.  Patient arrived still not responsive to voice or command.  Intubated for airway protection.  Work-up demonstrates evidence of a small right-sided chronic appearing subdural hematoma.  On the left side she has an acute on chronic subdural hematoma with marked mass effect and evidence of early transtentorial herniation and subfalcine herniation.  No past medical history on file.    No family history on file.  Social History:  has no history on file for tobacco use, alcohol use, and drug use.  Allergies: Not on File  Medications: I have reviewed the patient's current medications.  Results for orders placed or performed during the hospital encounter of 11/09/2021 (from the past 48 hour(s))  Sample to Blood Bank     Status: None   Collection Time: 10/27/2021  3:08 PM  Result Value Ref Range   Blood Bank Specimen SAMPLE AVAILABLE FOR TESTING    Sample Expiration      11/05/2021,2359 Performed at [facility], [address], [place], [place] [address]   Comprehensive metabolic panel     Status: Abnormal   Collection Time: 11/03/2021  3:18 PM  Result Value Ref Range   Sodium 139 135 - 145 mmol/L   Potassium 3.6 3.5 - 5.1 mmol/L   Chloride 109 98 - 111 mmol/L   CO2 24 22 - 32 mmol/L   Glucose, Bld 194 (H) 70 - 99 mg/dL    Comment: Glucose reference range applies only to samples taken after fasting for at least 8 hours.   BUN 35 (H) 8 - 23 mg/dL   Creatinine, Ser 6.60 (H) 0.44 - 1.00 mg/dL   Calcium 8.6 (L) 8.9 - 10.3 mg/dL   Total Protein 6.0 (L) 6.5 - 8.1 g/dL   Albumin 2.7 (L) 3.5 - 5.0 g/dL   AST 32 15 - 41 U/L   ALT 17 0 - 44 U/L   Alkaline Phosphatase 108 38 - 126  U/L   Total Bilirubin 0.5 0.3 - 1.2 mg/dL   GFR, Estimated 29 (L) >60 mL/min    Comment: (NOTE) Calculated using the CKD-EPI Creatinine Equation (2021)    Anion gap 6 5 - 15    Comment: Performed at [facility], [address] [place], [place] [address]  CBC     Status: Abnormal   Collection Time: 10/27/2021  3:18 PM  Result Value Ref Range   WBC 11.2 (H) 4.0 - 10.5 K/uL   RBC 4.07 3.87 - 5.11 MIL/uL   Hemoglobin 10.0 (L) 12.0 - 15.0 g/dL   HCT 01.0 (L) 93.2 - 35.5 %   MCV 81.1 80.0 - 100.0 fL   MCH 24.6 (L) 26.0 - 34.0 pg   MCHC 30.3 30.0 - 36.0 g/dL   RDW 73.2 20.2 - 54.2 %   Platelets 273 150 - 400 K/uL   nRBC 0.0 0.0 - 0.2 %    Comment: Performed at [facility], [address], [place], [place] [address]  Protime-INR     Status: None   Collection Time: 10/31/2021  3:18 PM  Result Value Ref Range   Prothrombin Time 14.8 11.4 - 15.2 seconds   INR 1.2 0.8 - 1.2  Comment: (NOTE) INR goal varies based on device and disease states. Performed at [facility], [address] [place], [place] [address]   Ethanol     Status: None   Collection Time: 11/01/2021  3:19 PM  Result Value Ref Range   Alcohol, Ethyl (B) <10 <10 mg/dL    Comment: (NOTE) Lowest detectable limit for serum alcohol is 10 mg/dL.  For medical purposes only. Performed at [facility], [address], [place], [place] [address]   Lactic acid, plasma     Status: Abnormal   Collection Time: 11/10/2021  3:20 PM  Result Value Ref Range   Lactic Acid, Venous 2.1 (HH) 0.5 - 1.9 mmol/L    Comment: CRITICAL RESULT CALLED TO, READ BACK BY AND VERIFIED WITH E.DIXON,RN @1558  11/15/2021 VANG.J Performed at [facility], [address] [place], [place]     DG Pelvis Portable  Result Date: 11/11/2021 CLINICAL DATA:  Trauma EXAM: PORTABLE PELVIS 1-2 VIEWS COMPARISON:  X-ray pelvis 06/03/2021 FINDINGS: Limited evaluation due to overlapping osseous structures and overlying soft  tissues. Total right hip arthroplasty partially visualized. There is no evidence of pelvic fracture or diastasis. No evidence of acute displaced fracture or dislocation of the left hip on frontal view. No pelvic bone lesions are seen. Visualized lower lumbar spine demonstrates severe degenerative changes. Marked vascular calcifications. IMPRESSION: Negative for acute traumatic injury in a patient status post total right hip arthroplasty. Electronically Signed   By: 06/05/2021 M.D.   On: 11/05/2021 15:41   DG Chest Port 1 View  Result Date: 10/26/2021 CLINICAL DATA:  Trauma EXAM: PORTABLE CHEST 1 VIEW COMPARISON:  CT chest, abdomen, pelvis 10/28/2021 FINDINGS: Endotracheal tube terminates 3.5 cm above the carina. Enteric tube courses below the hemidiaphragm with tip overlying the expected region of the gastric lumen and side port in the region of the gastroesophageal junction. The heart and mediastinal contours are within normal limits. Atherosclerotic plaque. No focal consolidation. No pulmonary edema. At least trace to small volume right pleural effusion. No pneumothorax. No acute osseous abnormality.  Thoracolumbar surgical hardware. IMPRESSION: 1. At least trace to small volume right pleural effusion. 2. Enteric tube courses below the hemidiaphragm with tip overlying the expected region of the gastric lumen and side port in the region of the gastroesophageal junction. Consider advancing by 3 cm. 3.  Aortic Atherosclerosis (ICD10-I70.0). Electronically Signed   By: 11/06/2021 M.D.   On: 11/06/2021 15:39    Review of systems not obtained due to patient factors. Blood pressure 98/62, pulse 100, resp. rate 18, height 4\' 11"  (1.499 m), weight 65.8 kg, SpO2 100 %. Patient is intubated and sedated.  She does not awaken to voice.  Her pupils are 3 mm and sluggish bilaterally.  Gaze is conjugate.  She still has paralytics on board and motor examination is unobtainable.  Examination head ears eyes nose  and throat demonstrates evidence of a right-sided scalp laceration which has been closed with staples.  She has significant bruising and maceration of her right scalp.  Oropharynx, nasopharynx and external auditory canals are clear.  Chest and abdomen appear benign.  Extremities are free from injury deformity.  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  I discussed situation with the patient's daughter-in-law.  She is in contact with the patient's husband.  I discussed the possibility moving forward with a left-sided craniotomy and evacuation of subdural hematoma in hopes of improving her symptoms.  We discussed the risks involved with surgery.  They are deciding as to whether to proceed.  Cooper Render Kainoa Swoboda 10/24/2021, 4:13 PM    '

## 2021-11-05 ENCOUNTER — Inpatient Hospital Stay (HOSPITAL_COMMUNITY): Payer: Medicare PPO

## 2021-11-05 LAB — GLUCOSE, CAPILLARY
Glucose-Capillary: 148 mg/dL — ABNORMAL HIGH (ref 70–99)
Glucose-Capillary: 126 mg/dL — ABNORMAL HIGH (ref 70–99)
Glucose-Capillary: 132 mg/dL — ABNORMAL HIGH (ref 70–99)
Glucose-Capillary: 131 mg/dL — ABNORMAL HIGH (ref 70–99)
Glucose-Capillary: 132 mg/dL — ABNORMAL HIGH (ref 70–99)
Glucose-Capillary: 122 mg/dL — ABNORMAL HIGH (ref 70–99)

## 2021-11-05 LAB — BASIC METABOLIC PANEL
Anion gap: 7 (ref 5–15)
BUN: 40 mg/dL — ABNORMAL HIGH (ref 8–23)
CO2: 19 mmol/L — ABNORMAL LOW (ref 22–32)
Calcium: 7.5 mg/dL — ABNORMAL LOW (ref 8.9–10.3)
Chloride: 114 mmol/L — ABNORMAL HIGH (ref 98–111)
Creatinine, Ser: 1.94 mg/dL — ABNORMAL HIGH (ref 0.44–1.00)
GFR, Estimated: 27 mL/min — ABNORMAL LOW (ref >60)
Glucose, Bld: 135 mg/dL — ABNORMAL HIGH (ref 70–99)
Potassium: 4.4 mmol/L (ref 3.5–5.1)
Sodium: 140 mmol/L (ref 135–145)

## 2021-11-05 LAB — HEMOGLOBIN A1C
Hgb A1c MFr Bld: 5.2 % (ref 4.8–5.6)
Mean Plasma Glucose: 102.54 mg/dL

## 2021-11-05 LAB — CBC
HCT: 19.1 % — ABNORMAL LOW (ref 36.0–46.0)
Hemoglobin: 6 g/dL — CL (ref 12.0–15.0)
MCH: 24.9 pg — ABNORMAL LOW (ref 26.0–34.0)
MCHC: 31.4 g/dL (ref 30.0–36.0)
MCV: 79.3 fL — ABNORMAL LOW (ref 80.0–100.0)
Platelets: 183 10*3/uL (ref 150–400)
RBC: 2.41 MIL/uL — ABNORMAL LOW (ref 3.87–5.11)
RDW: 14.8 % (ref 11.5–15.5)
WBC: 12.7 10*3/uL — ABNORMAL HIGH (ref 4.0–10.5)
nRBC: 0 % (ref 0.0–0.2)

## 2021-11-05 LAB — TRIGLYCERIDES: Triglycerides: 86 mg/dL (ref <150)

## 2021-11-05 LAB — HEMOGLOBIN AND HEMATOCRIT, BLOOD
HCT: 22.3 % — ABNORMAL LOW (ref 36.0–46.0)
Hemoglobin: 7.1 g/dL — ABNORMAL LOW (ref 12.0–15.0)

## 2021-11-05 LAB — PREPARE RBC (CROSSMATCH)

## 2021-11-05 MED ORDER — METHOCARBAMOL 500 MG PO TABS
1000.0000 mg | ORAL_TABLET | Freq: Three times a day (TID) | ORAL | Status: DC
  Administered 2021-11-05 – 2021-11-08 (×10): 1000 mg
  Filled 2021-11-05 (×10): qty 2

## 2021-11-05 MED ORDER — ACETAMINOPHEN 160 MG/5ML PO SOLN
1000.0000 mg | Freq: Four times a day (QID) | ORAL | Status: DC
  Administered 2021-11-05 – 2021-11-08 (×12): 1000 mg
  Filled 2021-11-05 (×13): qty 40.6

## 2021-11-05 MED ORDER — SODIUM CHLORIDE 0.9 % IV BOLUS
1000.0000 mL | Freq: Once | INTRAVENOUS | Status: AC
  Administered 2021-11-05: 1000 mL via INTRAVENOUS

## 2021-11-05 MED ORDER — MORPHINE SULFATE (PF) 2 MG/ML IV SOLN
1.0000 mg | INTRAVENOUS | Status: DC | PRN
  Administered 2021-11-05: 1 mg via INTRAVENOUS
  Administered 2021-11-06 – 2021-11-08 (×5): 2 mg via INTRAVENOUS
  Filled 2021-11-05 (×6): qty 1

## 2021-11-05 MED ORDER — OXYCODONE HCL 5 MG/5ML PO SOLN
2.5000 mg | ORAL | Status: DC | PRN
  Administered 2021-11-07 – 2021-11-08 (×2): 5 mg
  Filled 2021-11-05 (×2): qty 5

## 2021-11-05 MED ORDER — CHLORHEXIDINE GLUCONATE CLOTH 2 % EX PADS
6.0000 | MEDICATED_PAD | Freq: Every day | CUTANEOUS | Status: DC
  Administered 2021-11-05 – 2021-11-07 (×3): 6 via TOPICAL

## 2021-11-05 MED ORDER — ORAL CARE MOUTH RINSE
15.0000 mL | OROMUCOSAL | Status: DC | PRN
Start: 2021-11-05 — End: 2021-11-08

## 2021-11-05 MED ORDER — SODIUM CHLORIDE 0.9 % IV BOLUS: 500.0000 mL | Freq: Once | INTRAVENOUS | Status: AC

## 2021-11-05 MED ORDER — ORAL CARE MOUTH RINSE
15.0000 mL | OROMUCOSAL | Status: DC
  Administered 2021-11-05 – 2021-11-08 (×38): 15 mL via OROMUCOSAL

## 2021-11-05 MED ORDER — ATENOLOL 50 MG PO TABS
50.0000 mg | ORAL_TABLET | Freq: Every day | ORAL | Status: DC
  Administered 2021-11-05 – 2021-11-07 (×3): 50 mg
  Filled 2021-11-05 (×3): qty 1

## 2021-11-05 MED ORDER — CHLORHEXIDINE GLUCONATE CLOTH 2 % EX PADS: 6.0000 | MEDICATED_PAD | Freq: Every day | CUTANEOUS | Status: DC

## 2021-11-05 MED ORDER — SODIUM CHLORIDE 0.9% IV SOLUTION: Freq: Once | INTRAVENOUS | Status: AC

## 2021-11-05 NOTE — TOC CAGE-AID Note (Signed)
Transition of Care [facility]) - CAGE-AID Screening   Patient Details  Name: Jacqueline Richmond MRN: [id number] Date of Birth: 16-Feb-1946  Contact:    Judie Bonus, RN Phone Number: 11/05/2021, 5:57 AM   Clinical Narrative: Pt unable to participate d/t intubation and sedation post crani.    CAGE-AID Screening: Substance Abuse Screening unable to be completed due to: : Patient unable to participate             Substance Abuse Education Offered: No

## 2021-11-05 NOTE — Progress Notes (Signed)
RT and RN transported vent patient to CT and back to 4N. Vital signs stable through out.

## 2021-11-05 NOTE — Progress Notes (Signed)
Critical Value Hgb 6.0  Dr Sheliah Hatch notified and one unit of blood ordered.  VSS.  Will continue to monitor

## 2021-11-05 NOTE — Progress Notes (Signed)
Postop day 1.  Patient intubated and minimally sedated in the ICU.  No evidence of awakening or arousal.  Heart rate and blood pressure acceptable.  Oxygenating well.  Patient significantly anemic secondary to blood loss anemia.  She was transfused this morning.  The patient is unconscious.  She shows no awakening to noxious stimuli.  Her pupils are midposition and sluggish.  She has positive corneal reflexes.  She has a cough and a weak gag.  She has minimal flexion to noxious stimuli of her extremities.  Follow-up head CT scan demonstrates good appearance of her evacuation of her subdural hematoma without evidence of residual or recurrent hemorrhage.  Her left hemisphere is swollen without evidence of obvious infarct.  There is a mild small chronic appearing subdural on the right.  Status post craniotomy for subdural hematoma.  Patient's neurologic exam is reasonably minimal at present.  Continue supportive care for now.  Plan to remove drain in the morning.

## 2021-11-05 NOTE — Progress Notes (Signed)
   11/05/21 1340  Clinical Encounter Type  Visited With Patient not available  Visit Type Follow-up;Spiritual support  Referral From Chaplain  Consult/Referral To Chaplain   Chaplain attempted to visit patient during rounding on the unit. Patient was not available. No family was present.   Valerie Roys [facility]  [phone]

## 2021-11-05 NOTE — Progress Notes (Signed)
Trauma/Critical Care Follow Up Note  Subjective:    Overnight Issues:   Objective:  Vital signs for last 24 hours: Temp:  [94.1 F (34.5 C)-100.2 F (37.9 C)] 99.5 F (37.5 C) (08/15 0850) Pulse Rate:  [70-104] 94 (08/15 0850) Resp:  [12-27] 18 (08/15 0850) BP: (94-152)/(44-85) 112/53 (08/15 0745) SpO2:  [100 %] 100 % (08/15 0850) Arterial Line BP: (91-177)/(42-72) 128/50 (08/15 0850) FiO2 (%):  [40 %-100 %] 40 % (08/15 0727) Weight:  [65.8 kg] 65.8 kg (08/14 1600)  Hemodynamic parameters for last 24 hours:    Intake/Output from previous day: 08/14 0701 - 08/15 0700 In: 3310.8 [I.V.:1989.9; IV Piggyback:1321] Out: 705 [Urine:390; Drains:65; Blood:250]  Intake/Output this shift: No intake/output data recorded.  Vent settings for last 24 hours: Vent Mode: PRVC FiO2 (%):  [40 %-100 %] 40 % Set Rate:  [18 bmp-22 bmp] 22 bmp Vt Set:  [340 mL] 340 mL PEEP:  [5 cmH20] 5 cmH20 Plateau Pressure:  [12 cmH20-19 cmH20] 12 cmH20  Physical Exam:  Gen: comfortable, no distress Neuro: GCS3, flicker of BLE to noxious stimulus HEENT: PERRL, but sluggish Neck: supple CV: RRR Pulm: unlabored breathing Abd: soft, NT GU: clear yellow urine Extr: wwp, no edema   Results for orders placed or performed during the hospital encounter of 11/03/2021 (from the past 24 hour(s))  Sample to Blood Bank     Status: None   Collection Time: 10/27/2021  3:08 PM  Result Value Ref Range   Blood Bank Specimen SAMPLE AVAILABLE FOR TESTING    Sample Expiration      11/05/2021,2359 Performed at [facility], [address], [place], [place] [address]   Type and screen [facility]     Status: None (Preliminary result)   Collection Time: 11/12/2021  3:08 PM  Result Value Ref Range   ABO/RH(D) O POS    Antibody Screen NEG    Sample Expiration 11/07/2021,2359    Unit Number [id number]    Blood Component Type RBC LR PHER2    Unit division 00    Status of Unit ALLOCATED     Transfusion Status OK TO TRANSFUSE    Crossmatch Result Compatible    Unit Number [id number]    Blood Component Type RED CELLS,LR    Unit division 00    Status of Unit ISSUED    Transfusion Status OK TO TRANSFUSE    Crossmatch Result      Compatible Performed at [facility], [address], [place], [place] [address]   Comprehensive metabolic panel     Status: Abnormal   Collection Time: 11/13/2021  3:18 PM  Result Value Ref Range   Sodium 139 135 - 145 mmol/L   Potassium 3.6 3.5 - 5.1 mmol/L   Chloride 109 98 - 111 mmol/L   CO2 24 22 - 32 mmol/L   Glucose, Bld 194 (H) 70 - 99 mg/dL   BUN 35 (H) 8 - 23 mg/dL   Creatinine, Ser 4.16 (H) 0.44 - 1.00 mg/dL   Calcium 8.6 (L) 8.9 - 10.3 mg/dL   Total Protein 6.0 (L) 6.5 - 8.1 g/dL   Albumin 2.7 (L) 3.5 - 5.0 g/dL   AST 32 15 - 41 U/L   ALT 17 0 - 44 U/L   Alkaline Phosphatase 108 38 - 126 U/L   Total Bilirubin 0.5 0.3 - 1.2 mg/dL   GFR, Estimated 29 (L) >60 mL/min   Anion gap 6 5 - 15  CBC     Status: Abnormal   Collection Time: 23-Nov-2021  3:18 PM  Result Value Ref Range   WBC 11.2 (H) 4.0 - 10.5 K/uL   RBC 4.07 3.87 - 5.11 MIL/uL   Hemoglobin 10.0 (L) 12.0 - 15.0 g/dL   HCT 71.0 (L) 62.6 - 94.8 %   MCV 81.1 80.0 - 100.0 fL   MCH 24.6 (L) 26.0 - 34.0 pg   MCHC 30.3 30.0 - 36.0 g/dL   RDW 54.6 27.0 - 35.0 %   Platelets 273 150 - 400 K/uL   nRBC 0.0 0.0 - 0.2 %  Protime-INR     Status: None   Collection Time: 2021/11/23  3:18 PM  Result Value Ref Range   Prothrombin Time 14.8 11.4 - 15.2 seconds   INR 1.2 0.8 - 1.2  Ethanol     Status: None   Collection Time: 11/23/21  3:19 PM  Result Value Ref Range   Alcohol, Ethyl (B) <10 <10 mg/dL  Lactic acid, plasma     Status: Abnormal   Collection Time: 11-23-21  3:20 PM  Result Value Ref Range   Lactic Acid, Venous 2.1 (HH) 0.5 - 1.9 mmol/L  I-Stat arterial blood gas, ED     Status: Abnormal   Collection Time: 2021/11/23  4:44 PM  Result Value Ref Range   pH, Arterial  7.300 (L) 7.35 - 7.45   pCO2 arterial 52.7 (H) 32 - 48 mmHg   pO2, Arterial 479 (H) 83 - 108 mmHg   Bicarbonate 25.9 20.0 - 28.0 mmol/L   TCO2 28 22 - 32 mmol/L   O2 Saturation 100 %   Acid-base deficit 1.0 0.0 - 2.0 mmol/L   Sodium 139 135 - 145 mmol/L   Potassium 3.7 3.5 - 5.1 mmol/L   Calcium, Ion 1.20 1.15 - 1.40 mmol/L   HCT 25.0 (L) 36.0 - 46.0 %   Hemoglobin 8.5 (L) 12.0 - 15.0 g/dL   Patient temperature 09.3 F    Collection site RADIAL, ALLEN'S TEST ACCEPTABLE    Drawn by RT    Sample type ARTERIAL   ABO/Rh     Status: None   Collection Time: November 23, 2021  5:40 PM  Result Value Ref Range   ABO/RH(D)      O POS Performed at [facility], [address] [place], [place] [address]   I-STAT 7, (LYTES, BLD GAS, ICA, H+H)     Status: Abnormal   Collection Time: November 23, 2021  6:23 PM  Result Value Ref Range   pH, Arterial 7.380 7.35 - 7.45   pCO2 arterial 37.4 32 - 48 mmHg   pO2, Arterial 309 (H) 83 - 108 mmHg   Bicarbonate 22.8 20.0 - 28.0 mmol/L   TCO2 24 22 - 32 mmol/L   O2 Saturation 100 %   Acid-base deficit 3.0 (H) 0.0 - 2.0 mmol/L   Sodium 141 135 - 145 mmol/L   Potassium 3.6 3.5 - 5.1 mmol/L   Calcium, Ion 1.15 1.15 - 1.40 mmol/L   HCT 20.0 (L) 36.0 - 46.0 %   Hemoglobin 6.8 (LL) 12.0 - 15.0 g/dL   Patient temperature 93.7 C    Sample type ARTERIAL    Comment NOTIFIED PHYSICIAN   Prepare RBC (crossmatch)     Status: None   Collection Time: Nov 23, 2021  6:28 PM  Result Value Ref Range   Order Confirmation      ORDER PROCESSED BY BLOOD BANK Performed at [facility], [address] [place], [place] [address]   Glucose, capillary     Status: Abnormal   Collection Time: 10/31/2021  7:56 PM  Result Value Ref Range   Glucose-Capillary 168 (H) 70 - 99 mg/dL  Glucose, capillary     Status: Abnormal   Collection Time: 11/15/2021 11:08 PM  Result Value Ref Range   Glucose-Capillary 158 (H) 70 - 99 mg/dL  Glucose, capillary     Status: Abnormal   Collection Time:  11/05/21  3:07 AM  Result Value Ref Range   Glucose-Capillary 148 (H) 70 - 99 mg/dL  CBC     Status: Abnormal   Collection Time: 11/05/21  5:00 AM  Result Value Ref Range   WBC 12.7 (H) 4.0 - 10.5 K/uL   RBC 2.41 (L) 3.87 - 5.11 MIL/uL   Hemoglobin 6.0 (LL) 12.0 - 15.0 g/dL   HCT 85.2 (L) 77.8 - 24.2 %   MCV 79.3 (L) 80.0 - 100.0 fL   MCH 24.9 (L) 26.0 - 34.0 pg   MCHC 31.4 30.0 - 36.0 g/dL   RDW 35.3 61.4 - 43.1 %   Platelets 183 150 - 400 K/uL   nRBC 0.0 0.0 - 0.2 %  Basic metabolic panel     Status: Abnormal   Collection Time: 11/05/21  5:00 AM  Result Value Ref Range   Sodium 140 135 - 145 mmol/L   Potassium 4.4 3.5 - 5.1 mmol/L   Chloride 114 (H) 98 - 111 mmol/L   CO2 19 (L) 22 - 32 mmol/L   Glucose, Bld 135 (H) 70 - 99 mg/dL   BUN 40 (H) 8 - 23 mg/dL   Creatinine, Ser 5.40 (H) 0.44 - 1.00 mg/dL   Calcium 7.5 (L) 8.9 - 10.3 mg/dL   GFR, Estimated 27 (L) >60 mL/min   Anion gap 7 5 - 15  Triglycerides     Status: None   Collection Time: 11/05/21  5:00 AM  Result Value Ref Range   Triglycerides 86 <150 mg/dL  Prepare RBC (crossmatch)     Status: None   Collection Time: 11/05/21  6:49 AM  Result Value Ref Range   Order Confirmation      ORDER PROCESSED BY BLOOD BANK BB SAMPLE OR UNITS ALREADY AVAILABLE Performed at [facility], [address], [place], [place] [address]   Glucose, capillary     Status: Abnormal   Collection Time: 11/05/21  7:39 AM  Result Value Ref Range   Glucose-Capillary 126 (H) 70 - 99 mg/dL    Assessment & Plan: The plan of care was discussed with the bedside nurse for the day, Brooke, who is in agreement with this plan and no additional concerns were raised.   Present on Admission:  SDH (subdural hematoma) (HCC)    LOS: 1 day   Additional comments:I reviewed the patient's new clinical lab test results.   and I reviewed the patients new imaging test results.    Fall  VDRF - full support Bilateral SDH R>L with uncal herniation -  NSGY c/s, Dr. Jordan Likes, s/p L craniotomy 8/14, JP SS R posterior 9th rib fx with small HTX - multimodal pain control and pulm toilet Scalp laceration - repaired with staples CKD stage IV HTN DM-2 PAD s/p stents on plavix - hold plavix H/o Deconditioning/ Functional quadriplegia March 2023 that is improved - anticipate high therapy needs ID - none VTE - SCDs only FEN - IVF, NPO/OG Foley - to remain Dispo - ICU  Lengthy discussion with  patient's son and daughter-in-law, who report they are joint HC-POA for the patient and will provide documentation. Explained possible clinical outcomes and potential needs for trach/PEG/facility. Encouraged them to begin thinking about the decisions they would make on her behalf or to find an advance directive completed by the patient to provide them with some direction. Also discussed code status and both seem to be confident that she would want to be DNR, but requested the opportunity to discuss this decision with the patient's husband. We discussed giving the patient a couple of days to reassess neuro status for prognostication.  Critical Care Total Time: 75 minutes   Diamantina Monks, MD Trauma & General Surgery Please use [email or web address] to contact on call provider  11/05/2021  *Care during the described time interval was provided by me. I have reviewed this patient's available data, including medical history, events of note, physical examination and test results as part of my evaluation.

## 2021-11-05 NOTE — Progress Notes (Signed)
Transition of Care [facility]) Screening Note   Patient Details  Name: Jacqueline Richmond Date of Birth: 1945-04-14   Transition of [facility]) CM/SW Contact:    Mearl Latin, LCSW Phone Number: 11/05/2021, 9:34 AM    Transition of Care Department [facility]) has reviewed patient who has been at [facility] since March. We will continue to monitor patient advancement through interdisciplinary progression rounds. If new patient transition needs arise, please place a TOC consult.

## 2021-11-05 NOTE — Progress Notes (Signed)
Urine output declining putting out 76mls/hr x2 hours.  Dr Sheliah Hatch notified, VO for NS bolus.

## 2021-11-06 ENCOUNTER — Inpatient Hospital Stay (HOSPITAL_COMMUNITY): Payer: Medicare PPO

## 2021-11-06 LAB — GLUCOSE, CAPILLARY
Glucose-Capillary: 91 mg/dL (ref 70–99)
Glucose-Capillary: 108 mg/dL — ABNORMAL HIGH (ref 70–99)
Glucose-Capillary: 100 mg/dL — ABNORMAL HIGH (ref 70–99)
Glucose-Capillary: 104 mg/dL — ABNORMAL HIGH (ref 70–99)
Glucose-Capillary: 117 mg/dL — ABNORMAL HIGH (ref 70–99)
Glucose-Capillary: 126 mg/dL — ABNORMAL HIGH (ref 70–99)

## 2021-11-06 LAB — CBC
HCT: 20.8 % — ABNORMAL LOW (ref 36.0–46.0)
Hemoglobin: 6.8 g/dL — CL (ref 12.0–15.0)
MCH: 26.8 pg (ref 26.0–34.0)
MCHC: 32.7 g/dL (ref 30.0–36.0)
MCV: 81.9 fL (ref 80.0–100.0)
Platelets: 158 10*3/uL (ref 150–400)
RBC: 2.54 MIL/uL — ABNORMAL LOW (ref 3.87–5.11)
RDW: 15.4 % (ref 11.5–15.5)
WBC: 15.2 10*3/uL — ABNORMAL HIGH (ref 4.0–10.5)
nRBC: 0 % (ref 0.0–0.2)

## 2021-11-06 LAB — MAGNESIUM
Magnesium: 1.8 mg/dL (ref 1.7–2.4)
Magnesium: 1.9 mg/dL (ref 1.7–2.4)

## 2021-11-06 LAB — PREPARE RBC (CROSSMATCH)

## 2021-11-06 LAB — HEMOGLOBIN AND HEMATOCRIT, BLOOD
HCT: 25.5 % — ABNORMAL LOW (ref 36.0–46.0)
Hemoglobin: 7.9 g/dL — ABNORMAL LOW (ref 12.0–15.0)

## 2021-11-06 LAB — PHOSPHORUS
Phosphorus: 4.4 mg/dL (ref 2.5–4.6)
Phosphorus: 4.1 mg/dL (ref 2.5–4.6)

## 2021-11-06 MED ORDER — SODIUM CHLORIDE 0.9% IV SOLUTION
Freq: Once | INTRAVENOUS | Status: AC
Start: 2021-11-06 — End: 2021-11-06

## 2021-11-06 MED ORDER — PIVOT 1.5 CAL PO LIQD
1000.0000 mL | ORAL | Status: DC
  Administered 2021-11-06 – 2021-11-07 (×2): 1000 mL

## 2021-11-06 NOTE — Progress Notes (Signed)
Cortrak Tube Team Note:  Consult received to place a Cortrak feeding tube. Two RD on Cortrak team attempted placement; placement unsuccessful. RN notified; RN on unit proceeded with OG placement. RN stated she will inform MD of unsuccessful Cortrak placement.    Kirby Crigler RD, LDN Clinical Dietitian See Loretha Stapler for contact information.

## 2021-11-06 NOTE — Progress Notes (Signed)
No new problems or events overnight.  Patient remains unconscious with no evidence of awakening to noxious stimuli.  Her pupils are midposition and sluggish.  Her gaze is conjugate.  She has a weak cough and gag.  She has some minimal flexion with noxious stimuli to her extremities.  Her wound is clean and dry.  Her drain output is low.  Patient status post fall with large contrecoup subdural hematoma.  Patient with minimal neurologic function postoperatively despite his scan with resolution of her hemorrhage.  Plan to get follow-up CT scan tomorrow to help with prognosis.  Overall hope for recovery is slim.

## 2021-11-06 NOTE — Progress Notes (Signed)
Date and time results received: 11/06/21 0700 (use smartphrase ".now" to insert current time)  Test: Hemoglobin Critical Value: 6.8  Name of Provider Notified: Bedelia Person, MD  Orders Received? Or Actions Taken?:  Critical called to night shift RN at shift change, passed on to this RN. MD notified and given order to transfuse 1 unit of RBC.

## 2021-11-06 NOTE — Progress Notes (Signed)
Trauma/Critical Care Follow Up Note  Subjective:    Overnight Issues:   Objective:  Vital signs for last 24 hours: Temp:  [98.3 F (36.8 C)-99.4 F (37.4 C)] 98.6 F (37 C) (08/16 0855) Pulse Rate:  [79-114] 79 (08/16 1110) Resp:  [18-27] 25 (08/16 1110) BP: (108-155)/(48-106) 126/48 (08/16 1110) SpO2:  [96 %-100 %] 99 % (08/16 1110) Arterial Line BP: (116-166)/(50-79) 146/55 (08/16 1100) FiO2 (%):  [40 %] 40 % (08/16 1110)  Hemodynamic parameters for last 24 hours:    Intake/Output from previous day: 08/15 0701 - 08/16 0700 In: 3906.2 [I.V.:2458.2; Blood:140; IV Piggyback:1308] Out: 1780 [Urine:1060; Emesis/NG output:700; Drains:20]  Intake/Output this shift: Total I/O In: 775.5 [I.V.:332.1; Blood:443.3] Out: -   Vent settings for last 24 hours: Vent Mode: PRVC FiO2 (%):  [40 %] 40 % Set Rate:  [22 bmp] 22 bmp Vt Set:  [340 mL] 340 mL PEEP:  [5 cmH20] 5 cmH20 Plateau Pressure:  [10 cmH20-15 cmH20] 10 cmH20  Physical Exam:  Gen: comfortable, no distress Neuro: GCS 4(E1V1M2) HEENT: PERRL Neck: supple CV: RRR Pulm: unlabored breathing Abd: soft, NT GU: clear yellow urine Extr: wwp, no edema   Results for orders placed or performed during the hospital encounter of 10/25/2021 (from the past 24 hour(s))  Glucose, capillary     Status: Abnormal   Collection Time: 11/05/21  4:02 PM  Result Value Ref Range   Glucose-Capillary 131 (H) 70 - 99 mg/dL  Glucose, capillary     Status: Abnormal   Collection Time: 11/05/21  7:17 PM  Result Value Ref Range   Glucose-Capillary 132 (H) 70 - 99 mg/dL  Glucose, capillary     Status: Abnormal   Collection Time: 11/05/21 11:23 PM  Result Value Ref Range   Glucose-Capillary 122 (H) 70 - 99 mg/dL  Glucose, capillary     Status: None   Collection Time: 11/06/21  3:10 AM  Result Value Ref Range   Glucose-Capillary 91 70 - 99 mg/dL  CBC     Status: Abnormal   Collection Time: 11/06/21  5:24 AM  Result Value Ref Range    WBC 15.2 (H) 4.0 - 10.5 K/uL   RBC 2.54 (L) 3.87 - 5.11 MIL/uL   Hemoglobin 6.8 (LL) 12.0 - 15.0 g/dL   HCT 53.9 (L) 76.7 - 34.1 %   MCV 81.9 80.0 - 100.0 fL   MCH 26.8 26.0 - 34.0 pg   MCHC 32.7 30.0 - 36.0 g/dL   RDW 93.7 90.2 - 40.9 %   Platelets 158 150 - 400 K/uL   nRBC 0.0 0.0 - 0.2 %  Prepare RBC (crossmatch)     Status: None   Collection Time: 11/06/21  7:07 AM  Result Value Ref Range   Order Confirmation      ORDER PROCESSED BY BLOOD BANK BB SAMPLE OR UNITS ALREADY AVAILABLE Performed at [facility], [address] [place], [place] [address]   Glucose, capillary     Status: Abnormal   Collection Time: 11/06/21  8:16 AM  Result Value Ref Range   Glucose-Capillary 108 (H) 70 - 99 mg/dL  Hemoglobin and hematocrit, blood     Status: Abnormal   Collection Time: 11/06/21 10:05 AM  Result Value Ref Range   Hemoglobin 7.9 (L) 12.0 - 15.0 g/dL   HCT 99.2 (L) 42.6 - 83.4 %    Assessment & Plan: The plan of care was discussed with the bedside nurse for the day, [name], who is in  agreement with this plan and no additional concerns were raised.   Present on Admission:  SDH (subdural hematoma) (HCC)    LOS: 2 days   Additional comments:I reviewed the patient's new clinical lab test results.   and I reviewed the patients new imaging test results.    Fall   VDRF - full support Bilateral SDH R>L with uncal herniation - NSGY c/s, Dr. Jordan Likes, s/p L craniotomy 8/14, JP SS R posterior 9th rib fx with small HTX - multimodal pain control and pulm toilet Scalp laceration - repaired with staples CKD stage IV HTN DM-2 PAD s/p stents on plavix - hold plavix H/o Deconditioning/ Functional quadriplegia March 2023 that is improved - anticipate high therapy needs ID - none VTE - SCDs only FEN - IVF, cortrak, TF today Foley - to remain Dispo - ICU, family elects for DNR status today, order changed electronically  Critical Care Total Time: 35 minutes  Diamantina Monks,  MD Trauma & General Surgery Please use [email or web address] to contact on call provider  11/06/2021  *Care during the described time interval was provided by me. I have reviewed this patient's available data, including medical history, events of note, physical examination and test results as part of my evaluation.

## 2021-11-06 NOTE — TOC Initial Note (Signed)
Transition of Care [facility]) - Initial/Assessment Note    Patient Details  Name: Jacqueline Richmond MRN: [id number] Date of Birth: 1945-06-14  Transition [facility]) CM/SW Contact:    Glennon Mac, RN Phone Number: 11/06/2021, 4:04 PM  Clinical Narrative:                 Pt s/p fall on Nov 18, 2021, sustaining SDH R>L with uncal herniation, Rt posterior 9th rib fx, with small HTX, scalp laceration.  Pt s/p craniotomy on November 18, 2021.   PTA, pt has resided at [facility] since March. Noted patient with minimal neurologic function post craniotomy; she remains intubated and is not following commands.  Patient was made a DNR/DNI.  TOC will follow as patient progresses.     Barriers to Discharge: Continued Medical Work up          Expected Discharge Plan and Services     Discharge Planning Services: CM Consult   Living arrangements for the past 2 months: Skilled Nursing Facility                                      Prior Living Arrangements/Services Living arrangements for the past 2 months: Skilled Nursing Facility Lives with:: Facility Resident Patient language and need for interpreter reviewed:: Yes        Need for Family Participation in Patient Care: Yes (Comment) Care giver support system in place?: Yes (comment)   Criminal Activity/Legal Involvement Pertinent to Current Situation/Hospitalization: No - Comment as needed             Admission diagnosis:  Trauma [T14.90XA] SDH (subdural hematoma) (HCC) [S06.5XAA] Patient Active Problem List   Diagnosis Date Noted   SDH (subdural hematoma) (HCC) 18-Nov-2021   PCP:  Georgann Housekeeper, MD Pharmacy:  No Pharmacies Listed    Social Determinants of Health (SDOH) Interventions    Readmission Risk Interventions     No data to display         Quintella Baton, RN, BSN  Trauma/Neuro ICU Case Manager [phone]

## 2021-11-06 NOTE — Progress Notes (Signed)
Initial Nutrition Assessment  DOCUMENTATION CODES:   Not applicable  INTERVENTION:   Initiate tube feeding via OG tube: Pivot 1.5 at 20 ml/h   As able increase by 10 ml every 8 hours to goal rate of 50 ml/h (1200 ml per day)  Provides 1800 kcal, 112 gm protein, 910 ml free water daily  Monitor magnesium and phosphorus every 12 hours x 4 occurrences, MD to replete as needed, as pt is at risk for refeeding syndrome.   NUTRITION DIAGNOSIS:   Increased nutrient needs related to wound healing as evidenced by estimated needs.  GOAL:   Patient will meet greater than or equal to 90% of their needs  MONITOR:   Labs, TF tolerance  REASON FOR ASSESSMENT:   Ventilator    ASSESSMENT:   Pt with PMH of PAD with LE stents on plavix, deconditioning/functional quadriplegia as of March 2023 which had improved with pt using walker to ambulate, CKD, HTN, DM admitted after a fall with bilateral SDH with uncal herniation s/p craniotomy 8/14, also R posterior 9th rib fx with small HTX, scalp laceration.   Cortrak unsuccessful  Per Trauma ok to start trickle TF  Medications reviewed and include: colace, SSI, protonix, miralax  NS @ 75 ml/hr   Labs reviewed: Corrected calcium: 8.4  EVD: 20 ml  16 F OG tube: 700 ml out; tip in distal stomach    NUTRITION - FOCUSED PHYSICAL EXAM:  Deferred   Diet Order:   Diet Order             Diet NPO time specified  Diet effective now                   EDUCATION NEEDS:   Not appropriate for education at this time  Skin:  Skin Assessment: Skin Integrity Issues: Skin Integrity Issues:: Stage II Stage II: bilateral buttocks  Last BM:  unknown  Height:   Ht Readings from Last 1 Encounters:  10-Nov-2021 4\' 11"  (1.499 m)    Weight:   Wt Readings from Last 1 Encounters:  11-10-2021 65.8 kg   BMI:  Body mass index is 29.3 kg/m.  Estimated Nutritional Needs:   Kcal:  1700-1900  Protein:  100-120 grams  Fluid:  > 1.7  L/day  11/06/21., RD, LDN, CNSC See AMiON for contact information

## 2021-11-07 ENCOUNTER — Inpatient Hospital Stay (HOSPITAL_COMMUNITY): Payer: Medicare PPO

## 2021-11-07 DIAGNOSIS — G40901 Epilepsy, unspecified, not intractable, with status epilepticus: Secondary | ICD-10-CM

## 2021-11-07 DIAGNOSIS — L8990 Pressure ulcer of unspecified site, unspecified stage: Secondary | ICD-10-CM | POA: Insufficient documentation

## 2021-11-07 DIAGNOSIS — I6389 Other cerebral infarction: Secondary | ICD-10-CM

## 2021-11-07 DIAGNOSIS — I639 Cerebral infarction, unspecified: Secondary | ICD-10-CM

## 2021-11-07 DIAGNOSIS — S065X9A Traumatic subdural hemorrhage with loss of consciousness of unspecified duration, initial encounter: Secondary | ICD-10-CM

## 2021-11-07 DIAGNOSIS — W19XXXA Unspecified fall, initial encounter: Secondary | ICD-10-CM

## 2021-11-07 DIAGNOSIS — S065XAA Traumatic subdural hemorrhage with loss of consciousness status unknown, initial encounter: Secondary | ICD-10-CM | POA: Diagnosis not present

## 2021-11-07 LAB — TYPE AND SCREEN
ABO/RH(D): O POS
Antibody Screen: NEGATIVE
Unit division: 0
Unit division: 0

## 2021-11-07 LAB — BPAM RBC
Blood Product Expiration Date: 202309112359
Blood Product Expiration Date: 202309122359
ISSUE DATE / TIME: 202308150729
ISSUE DATE / TIME: 202308160731
Unit Type and Rh: 5100
Unit Type and Rh: 5100

## 2021-11-07 LAB — GLUCOSE, CAPILLARY
Glucose-Capillary: 105 mg/dL — ABNORMAL HIGH (ref 70–99)
Glucose-Capillary: 115 mg/dL — ABNORMAL HIGH (ref 70–99)
Glucose-Capillary: 121 mg/dL — ABNORMAL HIGH (ref 70–99)
Glucose-Capillary: 83 mg/dL (ref 70–99)
Glucose-Capillary: 92 mg/dL (ref 70–99)
Glucose-Capillary: 117 mg/dL — ABNORMAL HIGH (ref 70–99)

## 2021-11-07 LAB — ECHOCARDIOGRAM COMPLETE
Area-P 1/2: 3.37 cm2
Height: 59 in
S' Lateral: 3.6 cm
Weight: 2321 oz

## 2021-11-07 LAB — MAGNESIUM
Magnesium: 1.8 mg/dL (ref 1.7–2.4)
Magnesium: 1.9 mg/dL (ref 1.7–2.4)

## 2021-11-07 LAB — PHOSPHORUS
Phosphorus: 3.7 mg/dL (ref 2.5–4.6)
Phosphorus: 3.5 mg/dL (ref 2.5–4.6)

## 2021-11-07 MED ORDER — LORAZEPAM 2 MG/ML IJ SOLN
INTRAMUSCULAR | Status: AC
  Filled 2021-11-07: qty 2

## 2021-11-07 MED ORDER — SODIUM CHLORIDE 0.9 % IV SOLN
100.0000 mg | Freq: Two times a day (BID) | INTRAVENOUS | Status: DC
  Administered 2021-11-07 – 2021-11-08 (×2): 100 mg via INTRAVENOUS
  Filled 2021-11-07 (×3): qty 10

## 2021-11-07 MED ORDER — STROKE: EARLY STAGES OF RECOVERY BOOK: Freq: Once | Status: AC

## 2021-11-07 MED ORDER — ALBUMIN HUMAN 5 % IV SOLN
12.5000 g | Freq: Once | INTRAVENOUS | Status: AC
  Administered 2021-11-07: 12.5 g via INTRAVENOUS
  Filled 2021-11-07: qty 250

## 2021-11-07 MED ORDER — LORAZEPAM 2 MG/ML IJ SOLN
4.0000 mg | Freq: Once | INTRAMUSCULAR | Status: AC
  Administered 2021-11-07: 4 mg via INTRAVENOUS

## 2021-11-07 MED ORDER — LACOSAMIDE 200 MG/20ML IV SOLN
200.0000 mg | Freq: Once | INTRAVENOUS | Status: AC
  Administered 2021-11-07: 200 mg via INTRAVENOUS
  Filled 2021-11-07: qty 20

## 2021-11-07 NOTE — Progress Notes (Signed)
Patient ID: Jacqueline Richmond, female   DOB: July 19, 1945, 76 y.o.   MRN: [id number] Follow up - Trauma Critical Care   Patient Details:    Jacqueline Richmond is an 76 y.o. female.  Lines/tubes : Airway 7.5 mm (Active)  Secured at (cm) 26 cm 11/07/21 0710  Measured From Lips 11/07/21 0710  Secured Location Right 11/07/21 0710  Secured By [employer] 11/07/21 0710  Tube Holder Repositioned Yes 11/07/21 0710  Prone position No 11/07/21 0710  Cuff Pressure (cm H2O) Green OR 18-26 CmH2O 11/07/21 0710  Site Condition Dry 11/07/21 0710     Arterial Line 11/21/2021 Right Radial (Active)  Site Assessment Clean, Dry, Intact 11/06/21 2100  Line Status Pulsatile blood flow 11/06/21 2100  Art Line Waveform Appropriate 11/06/21 2100  Art Line Interventions Zeroed and calibrated;Leveled;Connections checked and tightened 11/06/21 2100  Color/Movement/Sensation Capillary refill less than 3 sec 11/06/21 2100  Dressing Type Transparent 11/06/21 2100  Dressing Status Clean, Dry, Intact;Antimicrobial disc in place 11/06/21 2100  Dressing Change Due 11/11/21 11/06/21 2100     Closed System Drain 1 Left  Bulb (JP) (Active)  Site Description Unremarkable 11/06/21 2000  Dressing Status Clean, Dry, Intact 11/06/21 2000  Drainage Appearance Bloody 11/06/21 2000  Status To suction (Charged) 11/06/21 2000  Output (mL) 10 mL 11/07/21 0522     NG/OG Vented/Dual Lumen Oral External length of tube 47 cm (Active)  Tube Position (Required) External length of tube 11/06/21 2000  Measurement (cm) (Required) 47 cm 11/06/21 2000  Ongoing Placement Verification (Required) (See row information) Yes 11/06/21 2000  Site Assessment Clean, Dry, Intact 11/06/21 2000  Interventions Cleansed 11/06/21 2000  Status Feeding 11/06/21 2000     Urethral Catheter Jacqueline Richmond Double-lumen 16 Fr. (Active)  Indication for Insertion or Continuance of Catheter Unstable critically ill patients first 24-48 hours (See Criteria) 11/06/21  2000  Site Assessment Clean, Dry, Intact 11/06/21 2000  Catheter Maintenance Bag below level of bladder;Catheter secured;Drainage bag/tubing not touching floor;Insertion date on drainage bag;No dependent loops;Seal intact 11/06/21 2000  Collection Container Standard drainage bag 11/06/21 2000  Securement Method Securing device (Describe) 11/06/21 2000  Urinary Catheter Interventions (if applicable) Unclamped 11/06/21 0710  Output (mL) 35 mL 11/07/21 0522    Microbiology/Sepsis markers: No results found for this or any previous visit.  Anti-infectives:  Anti-infectives (From admission, onward)    Start     Dose/Rate Route Frequency Ordered Stop   11/05/21 0200  ceFAZolin (ANCEF) IVPB 2g/100 mL premix        2 g 200 mL/hr over 30 Minutes Intravenous Every 8 hours 11/21/21 1923 11/05/21 1104     Consults: Treatment Team:  Md, Trauma, MD Julio Sicks, MD    Studies:    Events:  Subjective:    Overnight Issues: stable  Objective:  Vital signs for last 24 hours: Temp:  [97.5 F (36.4 C)-99.7 F (37.6 C)] 97.5 F (36.4 C) (08/17 0800) Pulse Rate:  [78-106] 78 (08/17 0710) Resp:  [20-40] 27 (08/17 0710) BP: (109-187)/(48-106) 111/51 (08/17 0700) SpO2:  [91 %-100 %] 100 % (08/17 0710) Arterial Line BP: (125-207)/(45-83) 136/52 (08/17 0710) FiO2 (%):  [40 %] 40 % (08/17 0710)  Hemodynamic parameters for last 24 hours:    Intake/Output from previous day: 08/16 0701 - 08/17 0700 In: 2745.6 [I.V.:1740.9; Blood:443.3; NG/GT:361.3; IV Piggyback:200] Out: 740 [Urine:730; Drains:10]  Intake/Output this shift: No intake/output data recorded.  Vent settings for last 24 hours: Vent Mode: PRVC FiO2 (%):  [40 %]  40 % Set Rate:  [22 bmp] 22 bmp Vt Set:  [340 mL] 340 mL PEEP:  [5 cmH20] 5 cmH20  Physical Exam:  General: on vent Neuro: pupils 3mm, extensor posturing BUE to stim HEENT/Neck: ETT Resp: clear to auscultation bilaterally CVS: RRR GI: soft, mild distended<  NT Extremities: calves soft  Results for orders placed or performed during the hospital encounter of 13-Nov-2021 (from the past 24 hour(s))  Hemoglobin and hematocrit, blood     Status: Abnormal   Collection Time: 11/06/21 10:05 AM  Result Value Ref Range   Hemoglobin 7.9 (L) 12.0 - 15.0 g/dL   HCT 45.4 (L) 09.8 - 11.9 %  Glucose, capillary     Status: Abnormal   Collection Time: 11/06/21 12:02 PM  Result Value Ref Range   Glucose-Capillary 100 (H) 70 - 99 mg/dL  Magnesium     Status: None   Collection Time: 11/06/21 12:51 PM  Result Value Ref Range   Magnesium 1.8 1.7 - 2.4 mg/dL  Phosphorus     Status: None   Collection Time: 11/06/21 12:51 PM  Result Value Ref Range   Phosphorus 4.4 2.5 - 4.6 mg/dL  Glucose, capillary     Status: Abnormal   Collection Time: 11/06/21  3:28 PM  Result Value Ref Range   Glucose-Capillary 104 (H) 70 - 99 mg/dL  Magnesium     Status: None   Collection Time: 11/06/21  4:06 PM  Result Value Ref Range   Magnesium 1.9 1.7 - 2.4 mg/dL  Phosphorus     Status: None   Collection Time: 11/06/21  4:06 PM  Result Value Ref Range   Phosphorus 4.1 2.5 - 4.6 mg/dL  Glucose, capillary     Status: Abnormal   Collection Time: 11/06/21  7:19 PM  Result Value Ref Range   Glucose-Capillary 117 (H) 70 - 99 mg/dL  Glucose, capillary     Status: Abnormal   Collection Time: 11/06/21 11:15 PM  Result Value Ref Range   Glucose-Capillary 126 (H) 70 - 99 mg/dL  Glucose, capillary     Status: Abnormal   Collection Time: 11/07/21  3:07 AM  Result Value Ref Range   Glucose-Capillary 105 (H) 70 - 99 mg/dL  Magnesium     Status: None   Collection Time: 11/07/21  5:26 AM  Result Value Ref Range   Magnesium 1.8 1.7 - 2.4 mg/dL  Phosphorus     Status: None   Collection Time: 11/07/21  5:26 AM  Result Value Ref Range   Phosphorus 3.7 2.5 - 4.6 mg/dL  Glucose, capillary     Status: Abnormal   Collection Time: 11/07/21  7:54 AM  Result Value Ref Range   Glucose-Capillary  115 (H) 70 - 99 mg/dL    Assessment & Plan: Present on Admission:  SDH (subdural hematoma) (HCC)    LOS: 3 days   Additional comments:I reviewed the patient's new clinical lab test results. 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 Jordan Likes, s/p L craniotomy 8/14, JP SS, F/U CT H this AM shows new large hypoattenuation L frontal lobe (antrerior L MCA territory) I D/W Dr. Pearlean Brownie from the Stroke Service and we reviewed the CT together. They will consult. R posterior 9th rib fx with small HTX - multimodal pain control and pulm toilet Scalp laceration - repaired with staples CKD stage IV HTN DM-2 PAD s/p stents on plavix - hold plavix H/o Deconditioning/ Functional  quadriplegia March 2023 that is improved - anticipate high therapy needs ID - none VTE - SCDs only FEN - IVF, cortrak, TF at 20 for today as some distention, no vomiting 24h Foley - to remain Dispo - ICU, family elected for DNR status 8/16. Further GOC discussions in light of the CVA above.  Critical Care Total Time*: 44 Minutes  Violeta Gelinas, MD, MPH, FACS Trauma & General Surgery Use [email or web address] to contact on call provider  11/07/2021  *Care during the described time interval was provided by me. I have reviewed this patient's available data, including medical history, events of note, physical examination and test results as part of my evaluation.

## 2021-11-07 NOTE — Progress Notes (Signed)
No new events or problems overnight.  Patient remains unconscious with no evidence of awakening to noxious stimuli.  Patient with minimal abnormal flexion of both upper extremities.  Pupils remain midposition and sluggish.  Corneals gag and cough all still present.  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  No evidence of new hemorrhage or other abnormality.  Status post significant fall with resultant left-sided subdural hematoma.  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  Overall prognosis given current neurologic status coupled with her structural findings coupled with her age is extremely poor.  Discussions ongoing with family with regard to extent of further care.

## 2021-11-07 NOTE — Progress Notes (Signed)
Patient ID: Jacqueline Richmond, female   DOB: 06-03-1945, 76 y.o.   MRN: [id number] Appreciate Stroke Team eval and Epileptologist assistance. Patient remains in status refractory to Ativan 4mg . I left a VM for her son. I spoke with , her daughter in law by phone and updated her on this latest development. I recommend considering transition to comfort care. She reported they are discussing it and they will let Victorino Dike know.   [place], MD, MPH, FACS Please use [email or web address] to contact on call provider

## 2021-11-07 NOTE — Progress Notes (Signed)
OT Cancellation Note/ Discharge  Patient Details Name: Jacqueline Richmond MRN: [id number] DOB: Jul 29, 1945   Cancelled Treatment:    Reason Eval/Treat Not Completed: Other (comment) Pt unresponsive; Discussions ongoing with family with regard to extent of further care. Signing off at this time. Please reconsult if needed. Thank you.   Ifrah Vest,HILLARY 11/07/2021, 9:43 AM Luisa Dago, OT/L   Acute OT Clinical Specialist Acute Rehabilitation Services Pager [phone] Office [phone]

## 2021-11-07 NOTE — Progress Notes (Signed)
Patient hooked up to non MRI leads. [facility] monitoring.

## 2021-11-07 NOTE — Consult Note (Addendum)
Neurology Consultation  Reason for Consult: stroke on CT head  Referring Physician: Dr. Janee Morn  CC: fall/TBI new stroke on CTscan  History is obtained from:medical record   HPI: Jacqueline Richmond is a 76 y.o. female  PAD with LE stents on plavix, CKD, HTN, DM,h/o deconditioning, functional quad in 05/2021 who was brought into [facility] via EMS from SNF as a level 1 trauma after suffering a fall. Per EMS patient had an unwitnessed but audible fall.  She was unresponsive with significant bleeding from the scalp. Hypertensive en route and vomited x3. She received TXA in the ED.  NSGY is following, s/p L craniotomy 8/14, JP SS. Follow up CTH this am reveals a new large hypoattenuation L frontal lobe, Neurology consulted   She is intubated on no sedation. Minimal response to noxious stimuli. She does not follow commands. Moves purposeful spontaneous movements on right. Receiving albumin. TCD's  and carotid US  ordered. Will order EEG, results pending  No family at the bedside    LKW: unknown IV thrombolysis given?: no, SDH, Premorbid modified Rankin scale (mRS):  5-Severe disability-bedridden, incontinent, needs constant attention  ROS:  Unable to obtain due to altered mental status.   History reviewed. No pertinent past medical history.   History reviewed. No pertinent family history.   Social History:   has no history on file for tobacco use, alcohol use, and drug use.  Medications  Current Facility-Administered Medications:    0.9 %  sodium chloride infusion (Manually program via Guardrails IV Fluids), , Intravenous, Once, Pool, Sherilyn Cooter, MD   0.9 %  sodium chloride infusion, , Intravenous, Continuous, Pool, Sherilyn Cooter, MD, Last Rate: 75 mL/hr at 11/07/21 0800, Infusion Verify at 11/07/21 0800   acetaminophen (TYLENOL) 160 MG/5ML solution 1,000 mg, 1,000 mg, Per Tube, Q6H, Diamantina Monks, MD, 1,000 mg at 11/07/21 0412   albumin human 5 % solution 12.5 g, 12.5 g, Intravenous, Once, Violeta Gelinas, MD   atenolol (TENORMIN) tablet 50 mg, 50 mg, Per Tube, Daily, Diamantina Monks, MD, 50 mg at 11/06/21 3474   bisacodyl (DULCOLAX) suppository 10 mg, 10 mg, Rectal, Daily PRN, Julio Sicks, MD   Chlorhexidine Gluconate Cloth 2 % PADS 6 each, 6 each, Topical, Daily, Diamantina Monks, MD, 6 each at 11/07/21 0445   docusate (COLACE) 50 MG/5ML liquid 100 mg, 100 mg, Per Tube, BID, Pool, Sherilyn Cooter, MD, 100 mg at 11/06/21 2142   feeding supplement (PIVOT 1.5 CAL) liquid 1,000 mL, 1,000 mL, Per Tube, Q24H, Lovick, Lennie Odor, MD, Infusion Verify at 11/07/21 0800   fentaNYL (SUBLIMAZE) bolus via infusion 25-100 mcg, 25-100 mcg, Intravenous, Q15 min PRN, Julio Sicks, MD   fentaNYL (SUBLIMAZE) injection 25 mcg, 25 mcg, Intravenous, Once, Pool, Sherilyn Cooter, MD   fentaNYL in NS (71mcg/ml) infusion-PREMIX, 25-200 mcg/hr, Intravenous, Continuous, Julio Sicks, MD, Stopped at 11/05/21 0830   hydrALAZINE (APRESOLINE) injection 10 mg, 10 mg, Intravenous, Q2H PRN, Julio Sicks, MD, 10 mg at 10/29/2021 2231   insulin aspart (novoLOG) injection 0-15 Units, 0-15 Units, Subcutaneous, Q4H, Pool, Sherilyn Cooter, MD, 2 Units at 11/06/21 2324   ipratropium-albuterol (DUONEB) 0.5-2.5 (3) MG/3ML nebulizer solution 3 mL, 3 mL, Inhalation, Q6H PRN, Julio Sicks, MD   labetalol (NORMODYNE) injection 10-40 mg, 10-40 mg, Intravenous, Q10 min PRN, Julio Sicks, MD, 20 mg at 11/07/21 0403   levETIRAcetam (KEPPRA) IVPB 500 mg/100 mL premix, 500 mg, Intravenous, Q12H, Julio Sicks, MD, Stopped at 11/07/21 0421   methocarbamol (ROBAXIN) tablet 1,000 mg, 1,000 mg,  Per Tube, Q8H, Diamantina Monks, MD, 1,000 mg at 11/07/21 0519   morphine (PF) 2 MG/ML injection 1-2 mg, 1-2 mg, Intravenous, Q2H PRN, Diamantina Monks, MD, 2 mg at 11/07/21 0359   ondansetron (ZOFRAN-ODT) disintegrating tablet 4 mg, 4 mg, Oral, Q6H PRN **OR** ondansetron (ZOFRAN) injection 4 mg, 4 mg, Intravenous, Q6H PRN, Julio Sicks, MD   Oral care mouth rinse, 15 mL, Mouth Rinse,  Q2H, Lovick, Lennie Odor, MD, 15 mL at 11/07/21 0519   Oral care mouth rinse, 15 mL, Mouth Rinse, PRN, Lovick, Lennie Odor, MD   oxyCODONE (ROXICODONE) 5 MG/5ML solution 2.5-5 mg, 2.5-5 mg, Per Tube, Q4H PRN, Diamantina Monks, MD   pantoprazole (PROTONIX) EC tablet 40 mg, 40 mg, Oral, Daily **OR** pantoprazole (PROTONIX) injection 40 mg, 40 mg, Intravenous, Daily, Pool, Sherilyn Cooter, MD, 40 mg at 11/06/21 0921   polyethylene glycol (MIRALAX / GLYCOLAX) packet 17 g, 17 g, Per Tube, Daily, Julio Sicks, MD, 17 g at 11/06/21 2482   Exam: Current vital signs: BP (!) 122/51 (BP Location: Left Arm)   Pulse 77   Temp (!) 97.5 F (36.4 C) (Axillary)   Resp (!) 28   Ht 4\' 11"  (1.499 m)   Wt 65.8 kg   SpO2 100%   BMI 29.30 kg/m  Vital signs in last 24 hours: Temp:  [97.5 F (36.4 C)-99.7 F (37.6 C)] 97.5 F (36.4 C) (08/17 0800) Pulse Rate:  [77-106] 77 (08/17 0800) Resp:  [20-40] 28 (08/17 0800) BP: (109-187)/(48-106) 122/51 (08/17 0800) SpO2:  [91 %-100 %] 100 % (08/17 0800) Arterial Line BP: (125-207)/(45-83) 142/55 (08/17 0800) FiO2 (%):  [40 %] 40 % (08/17 0710)  GENERAL: critically ill intubated female  HEENT: - Normocephalic and atraumatic, dry mm, E t tube  LUNGS - Clear to auscultation bilaterally with no wheezes CV - S1S2 RRR, no m/r/g, equal pulses bilaterally. ABDOMEN - Soft, nontender, nondistended with normoactive BS Ext: warm, well perfused, intact peripheral pulses, no edema  NEURO:  Mental Status:  Minimal response to noxious stimuli.  Cranial Nerves: PERR, EOMI, no blink to threat bilaterally, unable to assess facial symmetry due to ET tube Motor: moves right side spontaneously and semipurposeful .  Trace withdrawal in the left leg and none in the left upper extremity Tone: is normal and bulk is normal Sensation- Intact to noxious stimuli Coordination: unable to assess    NIHSS 1a Level of Conscious.: 2 1b LOC Questions: 2 1c LOC Commands: 2 2 Best Gaze: 0 3 Visual:  2 4 Facial Palsy: 0 5a Motor Arm - left: 3 5b Motor Arm - Right: 2 6a Motor Leg - Left: 3 6b Motor Leg - Right: 2 7 Limb Ataxia: 0 8 Sensory: 2 9 Best Language: 3 10 Dysarthria: 2 11 Extinct. and Inatten.: 2 TOTAL: 27   Labs I have reviewed labs in epic and the results pertinent to this consultation are:  CBC    Component Value Date/Time   WBC 15.2 (H) 11/06/2021 0524   RBC 2.54 (L) 11/06/2021 0524   HGB 7.9 (L) 11/06/2021 1005   HCT 25.5 (L) 11/06/2021 1005   PLT 158 11/06/2021 0524   MCV 81.9 11/06/2021 0524   MCH 26.8 11/06/2021 0524   MCHC 32.7 11/06/2021 0524   RDW 15.4 11/06/2021 0524    CMP     Component Value Date/Time   NA 140 11/05/2021 0500   K 4.4 11/05/2021 0500   CL 114 (H) 11/05/2021 0500   CO2 19 (L)  11/05/2021 0500   GLUCOSE 135 (H) 11/05/2021 0500   BUN 40 (H) 11/05/2021 0500   CREATININE 1.94 (H) 11/05/2021 0500   CALCIUM 7.5 (L) 11/05/2021 0500   PROT 6.0 (L) 2021/11/19 1518   ALBUMIN 2.7 (L) 11/19/21 1518   AST 32 2021/11/19 1518   ALT 17 2021/11/19 1518   ALKPHOS 108 2021-11-19 1518   BILITOT 0.5 11/19/2021 1518   GFRNONAA 27 (L) 11/05/2021 0500    Lipid Panel     Component Value Date/Time   TRIG 86 11/05/2021 0500     Imaging I have reviewed the images obtained:  CT-head  1. New large area of hypoattenuation in the left frontal lobe, likely indicating acute ischemia of the anterior left MCA territory. 2. Worsening rightward midline shift now measures 11 mm, previously 3 mm. 3. Unchanged size of right hemispheric subdural hematoma.     Assessment:  RAHI CHANDONNET is a 76 y.o. female  PAD with LE stents on plavix, CKD, HTN, DM,h/o deconditioning, functional quad in 05/2021 who was brought into [facility] via EMS from SNF as a level 1 trauma after suffering a fall. Per EMS patient had an unwitnessed but audible fall.  She was unresponsive with significant bleeding from the scalp. Hypertensive en route and vomited x3. She received TXA  in the ED.  NSGY is following, s/p L craniotomy 8/14, JP SS.  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 Recommendations: - HgbA1c, fasting lipid panel - MRI of the brain without contrast - Frequent neuro checks - Echocardiogram - CTA head and neck - Risk factor modification - Telemetry monitoring - PT consult, OT consult, Speech consult -EEG r/o seizures - Continue Keppra for Sz ppx.  Loaded with Vimpat -TCD's  - Carotid US  - Stroke team to follow   Gevena Mart DNP, ACNPC-AG   I have personally obtained history,examined this patient, reviewed notes, independently viewed imaging studies, participated in medical decision making and plan of care.ROS completed by me personally and pertinent positives fully documented  I have made any additions or clarifications directly to the above note. Agree with note above.  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  Patient prognosis is quite guarded given poor neurological exam but will try to treat status with IV Vimpat in addition to Keppra maintain long-term EEG monitoring.  We will discuss goals of care with family as patient may need prolonged ventilatory support, feeding tube and likely nursing home care patient did not want that.  Discussed with Dr. Janee Morn. This patient is critically ill and at significant risk of neurological worsening, death and care requires constant monitoring of vital signs, hemodynamics,respiratory and cardiac monitoring, extensive review of multiple databases,  frequent neurological assessment, discussion with family, other specialists and medical decision making of high complexity.I have made any additions or clarifications directly to the above note.This critical care time does not reflect procedure time, or teaching time or supervisory time of PA/NP/Med Resident etc but could involve care discussion time.  I spent 50 minutes of neurocritical care time  in the care of  this patient.  ADDENDUM : I spoke to patient's daughter-in-law Victorino Dike over the phone Her poor neurological exam as well as stroke with likely related aphasia and electrographic status epilepticus.  We discussed treatment plans including adding Vimpat and if it is not effective patient may need to be placed in induced coma likely poor outcome.  She feels family would not want that and probably leaning towards comfort care and withdrawal of ventilatory support.  She will speak to her husband as well as patient's husband and let us know soon.  I also discussed this with Dr. Janee Morn trauma MD.  Patient is DNR and do not escalate care Delia Heady, MD Medical Director [facility] Pager: [phone] 11/07/2021 2:10 PM

## 2021-11-07 NOTE — Progress Notes (Signed)
Patient ID: Jacqueline Richmond, female   DOB: 10-May-1945, 76 y.o.   MRN: [id number] I met with her daughter in law, [name], at the bedside for a clinical update. I also discussed the findings on CT head this AM and the plan for Stroke Service consultation.   Georganna Skeans, MD, MPH, FACS Please use [email or web address] to contact on call provider

## 2021-11-07 NOTE — Progress Notes (Signed)
Carotid duplex bilateral and TCD study completed.   Please see CV Proc for preliminary results.   Eliyahu Bille, RDMS, RVT  

## 2021-11-07 NOTE — Procedures (Addendum)
Patient Name: Jacqueline Richmond  MRN: [id number]  Epilepsy Attending: Charlsie Quest  Referring Physician/Provider: Mathews Argyle, NP  Date: 11/07/2021 Duration: 28.35 mins  Patient history: 76 year old female with altered mental status. EEG to evaluate for seizure.  Level of alertness: comatose  AEDs during EEG study: None  Technical aspects: This EEG study was done with scalp electrodes positioned according to the 10-20 International system of electrode placement. Electrical activity was reviewed with band pass filter of 1-70Hz , sensitivity of 7 uV/mm, display speed of 39mm/sec with a 60Hz  notched filter applied as appropriate. EEG data were recorded continuously and digitally stored.  Video monitoring was available and reviewed as appropriate.  Description:  EEG showed sharply contoured 12 to 13 Hz beta activity admixed with 2 to 3 Hz delta slowing and polyspikes with evolution in morphology in left hemisphere, maximal left posterior quadrant. No clinical signs were seen during this EEG pattern.  This is consistent with electrographic status epilepticus.  IV Ativan 4 mg was administered at 1150 but study ended soon after.  EEG also showed continuous generalized and maximal left posterior quadrant polymorphic 2 to 3 Hz delta slowing admixed with 12 to 14 Hz generalized beta activity.  Hyperventilation and photic stimulation were not performed.      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  Additionally there is severe diffuse encephalopathy likely due to seizures.   Dr.  was notified.  [name]

## 2021-11-07 NOTE — Progress Notes (Signed)
Trauma Event Note    TRN at bedside to round. Remains intubated/sedated, EEG in progress. Not following commands on assessment. POC pending family meeting, per [name] primary RN may be transitioning to comfort care in the morning? DNR, no escalation of care. Checked in with primary RN, who has no needs for me at this time.    Jacqueline Richmond O Mattheu Brodersen  Trauma Response RN  Please call TRN at [phone] for further assistance.

## 2021-11-07 NOTE — Progress Notes (Signed)
EEG complete - results pending 

## 2021-11-07 NOTE — Progress Notes (Signed)
  Echocardiogram 2D Echocardiogram has been performed.  Augustine Radar 11/07/2021, 3:01 PM

## 2021-11-07 NOTE — Care Plan (Signed)
LTM EEG reviewed till 1311.  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  Dr. Janee Morn was notified.  Jacqueline Richmond

## 2021-11-07 NOTE — Progress Notes (Signed)
PT Cancellation Note  Patient Details Name: Jacqueline Richmond MRN: [id number] DOB: [birth year]   Cancelled Treatment:    Reason Eval/Treat Not Completed: Medical issues which prohibited therapy; noted patient unresponsive and ongoing discussions with family for goals.  PT will sign off.  Please consult again if appropriate.  Thanks.   Elray Mcgregor 11/07/2021, 10:08 AM Sheran Lawless, PT Acute Rehabilitation Services Office:[phone] 11/07/2021

## 2021-11-08 DIAGNOSIS — S065XAA Traumatic subdural hemorrhage with loss of consciousness status unknown, initial encounter: Secondary | ICD-10-CM

## 2021-11-08 DIAGNOSIS — G40901 Epilepsy, unspecified, not intractable, with status epilepticus: Secondary | ICD-10-CM | POA: Diagnosis not present

## 2021-11-08 LAB — LIPID PANEL
Cholesterol: 109 mg/dL (ref 0–200)
HDL: 24 mg/dL — ABNORMAL LOW (ref >40)
LDL Cholesterol: 61 mg/dL (ref 0–99)
Total CHOL/HDL Ratio: 4.5 RATIO
Triglycerides: 121 mg/dL (ref <150)
VLDL: 24 mg/dL (ref 0–40)

## 2021-11-08 LAB — GLUCOSE, CAPILLARY
Glucose-Capillary: 87 mg/dL (ref 70–99)
Glucose-Capillary: 107 mg/dL — ABNORMAL HIGH (ref 70–99)

## 2021-11-08 LAB — BASIC METABOLIC PANEL
Anion gap: 5 (ref 5–15)
BUN: 59 mg/dL — ABNORMAL HIGH (ref 8–23)
CO2: 16 mmol/L — ABNORMAL LOW (ref 22–32)
Calcium: 7.7 mg/dL — ABNORMAL LOW (ref 8.9–10.3)
Chloride: 122 mmol/L — ABNORMAL HIGH (ref 98–111)
Creatinine, Ser: 2.48 mg/dL — ABNORMAL HIGH (ref 0.44–1.00)
GFR, Estimated: 20 mL/min — ABNORMAL LOW (ref >60)
Glucose, Bld: 109 mg/dL — ABNORMAL HIGH (ref 70–99)
Potassium: 3.9 mmol/L (ref 3.5–5.1)
Sodium: 143 mmol/L (ref 135–145)

## 2021-11-08 LAB — CBC
HCT: 25.9 % — ABNORMAL LOW (ref 36.0–46.0)
Hemoglobin: 7.9 g/dL — ABNORMAL LOW (ref 12.0–15.0)
MCH: 26 pg (ref 26.0–34.0)
MCHC: 30.5 g/dL (ref 30.0–36.0)
MCV: 85.2 fL (ref 80.0–100.0)
Platelets: 192 10*3/uL (ref 150–400)
RBC: 3.04 MIL/uL — ABNORMAL LOW (ref 3.87–5.11)
RDW: 16.9 % — ABNORMAL HIGH (ref 11.5–15.5)
WBC: 12.9 10*3/uL — ABNORMAL HIGH (ref 4.0–10.5)
nRBC: 0 % (ref 0.0–0.2)

## 2021-11-08 MED ORDER — MORPHINE 100MG IN NS 100ML (1MG/ML) PREMIX INFUSION
0.0000 mg/h | INTRAVENOUS | Status: DC
  Administered 2021-11-08: 5 mg/h via INTRAVENOUS
  Filled 2021-11-08: qty 100

## 2021-11-08 MED ORDER — HALOPERIDOL LACTATE 5 MG/ML IJ SOLN: 2.5000 mg | INTRAMUSCULAR | Status: DC | PRN

## 2021-11-08 MED ORDER — GLYCOPYRROLATE 0.2 MG/ML IJ SOLN
0.2000 mg | INTRAMUSCULAR | Status: DC | PRN
Start: 2021-11-08 — End: 2021-11-08

## 2021-11-08 MED ORDER — LORAZEPAM 2 MG/ML IJ SOLN
2.0000 mg | INTRAMUSCULAR | Status: DC | PRN
  Filled 2021-11-08: qty 2

## 2021-11-08 MED ORDER — GLYCOPYRROLATE 0.2 MG/ML IJ SOLN
0.2000 mg | INTRAMUSCULAR | Status: DC | PRN
  Administered 2021-11-08: 0.2 mg via INTRAVENOUS
  Filled 2021-11-08: qty 1

## 2021-11-08 MED ORDER — MORPHINE BOLUS VIA INFUSION: 5.0000 mg | INTRAVENOUS | Status: DC | PRN

## 2021-11-08 MED ORDER — GLYCOPYRROLATE 1 MG PO TABS
1.0000 mg | ORAL_TABLET | ORAL | Status: DC | PRN
Start: 2021-11-08 — End: 2021-11-08

## 2021-11-10 ENCOUNTER — Encounter (HOSPITAL_COMMUNITY): Payer: Self-pay | Admitting: Neurosurgery

## 2021-11-11 ENCOUNTER — Encounter: Payer: Self-pay | Admitting: Pulmonary Disease

## 2021-11-22 NOTE — Discharge Summary (Signed)
Patient ID: Jacqueline Richmond [id number] 29-Mar-1945 76 y.o.  Admit date: 11/18/2021 Discharge date: 11/12/2021  Admitting Diagnosis: Fall  Discharge Diagnosis Patient Active Problem List   Diagnosis Date Noted   Pressure injury of skin 11/07/2021   SDH (subdural hematoma) ([name]) 11/14/2021   Hypercalcemia 06/04/2021   Pulmonary nodule 06/04/2021   PAD (peripheral artery disease) ([name]) 03/07/2021   Bilateral leg edema 03/07/2021   Stage 3a chronic kidney disease ([name]) 03/07/2021   Insulin-requiring or dependent type II diabetes mellitus ([name]) 03/07/2021   Mixed hyperlipidemia 03/07/2021   Primary hypertension 03/07/2021   Venous insufficiency    Cellulitis of right lower leg    Leg wound, right 12/18/2020   Diabetic foot ([name]) 12/18/2020   Body mass index (BMI) 27.0-27.9, adult 09/19/2020   Wound dehiscence 08/30/2020   Multiple thyroid nodules 08/10/2020   Acute blood loss anemia 08/09/2020   Hemothorax on right 08/02/2020   T10 vertebral fracture ([name]) 07/31/2020   Diabetic polyneuropathy associated with type 2 diabetes mellitus ([name]) 05/23/2020   Rotator cuff arthropathy of both shoulders 04/15/2019   History of colonic polyps    Polyp of transverse colon    Diverticulosis of colon without hemorrhage    Difficult intubation    Acute pain of right knee 07/30/2016   Chronic left shoulder pain 06/24/2016   Right hip pain 10/01/2015   At risk for falling 09/10/2015   Need for hepatitis C screening test 09/10/2015   Encounter for general adult medical examination without abnormal findings 08/27/2013   Chronic gouty arthropathy 06/03/2013   Body mass index 33.0-33.9, adult 06/03/2013   Psoriasis and similar disorder 08/06/2012   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   Uncontrolled secondary diabetes mellitus with stage 3 CKD (GFR 30-59) ([name]) 08/29/2011   CKD (chronic kidney disease) stage 3, GFR 30-59 ml/min ([facility]) 08/29/2011   Acute renal failure superimposed on stage 4 chronic  kidney disease ([name]) 08/29/2011   Hyperkalemia 08/29/2011   HTN (hypertension) 08/29/2011   Gout 08/29/2011   DM2 (diabetes mellitus, type 2) ([place]) 08/29/2011   Allergic rhinitis 08/29/2011   Osteoarthrosis, 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 Poor neurologic exam post-operatively and radiographic evidence of MCA stroke. Family elected for compassionate extubation and patient expired.   Physical Exam: Deceased    Allergies as of 11-15-21       Reactions   Codeine Itching   Codeine Other (See Comments)   Listed as allergy on MAR Unknown reaction   Iodinated Contrast Media    Other reaction(s): Unknown   Iodinated Contrast Media Other (See Comments)   Listed as allergy on MAR Unknown reaction   Pentazocine Other (See Comments)   Hallucinations   Statins    Statins Other (See Comments)   Listed as allergy on MAR Unknown reaction   Talwin [pentazocine] Other (See Comments)   Listed as allergy on MAR Unknown reaction        Medication List     ASK your doctor about these medications    allopurinol 300 MG tablet Commonly known as: ZYLOPRIM Take 300 mg by mouth daily.   aspirin EC 81 MG tablet Take 81 mg by mouth at bedtime. Swallow whole.   Cinnamon 500 MG capsule Take 500 mg by mouth  2 (two) times daily.   clopidogrel 75 MG tablet Commonly known as: PLAVIX Take 75 mg by mouth daily.   gabapentin 100 MG capsule Commonly known as: NEURONTIN Take 100 mg by mouth at bedtime.   Garlic 053 MG Caps Take 500 mg by mouth daily.   ipratropium 0.06 % nasal spray Commonly known as: ATROVENT Place 2 sprays into both nostrils 2 (two) times daily.   loperamide 2 MG tablet Commonly known as: IMODIUM A-D Take 2 mg by mouth  every 4 (four) hours as needed for diarrhea or loose stools.   loratadine 10 MG tablet Commonly known as: CLARITIN Take 10 mg by mouth daily.   niacin 100 MG tablet Commonly known as: VITAMIN B3 Take 100 mg by mouth 2 (two) times daily.   saccharomyces boulardii 250 MG capsule Commonly known as: FLORASTOR Take 250 mg by mouth daily.   sitaGLIPtin 50 MG tablet Commonly known as: JANUVIA Take 50 mg by mouth daily.   sodium bicarbonate 650 MG tablet Take 1,300 mg by mouth 2 (two) times daily.   Vitamin D-3 125 MCG (5000 UT) Tabs Take 5,000 Units by mouth daily.            Signed: Jesusita Oka, [facility] 11/12/2021, 9:21 PM

## 2021-11-22 NOTE — Progress Notes (Signed)
vLTM discontinued.  [facility] notified.  No skin breakdown noted at all skin sites. ?

## 2021-11-22 NOTE — Progress Notes (Addendum)
STROKE TEAM PROGRESS NOTE   INTERVAL HISTORY Her RN is at the bedside, who reports family has decided to move to comfort care measures only.. She is DNR. Her neuro exam remains poor. EEG was just removed.  But was in nonconvulsive status as per report yesterday. Vitals:   12/06/21 1050 2021-12-06 1100 2021/12/06 1200 2021-12-06 1422  BP:  (!) 108/51 (!) 105/49   Pulse: 81 81 79 90  Resp: (!) 29 (!) 28 (!) 28 (!) 25  Temp:      TempSrc:      SpO2: 100% 100% 100% 100%  Weight:      Height:       CBC:  Recent Labs  Lab 11/06/21 0524 11/06/21 1005 Dec 06, 2021 0351  WBC 15.2*  --  12.9*  HGB 6.8* 7.9* 7.9*  HCT 20.8* 25.5* 25.9*  MCV 81.9  --  85.2  PLT 158  --  192   Basic Metabolic Panel:  Recent Labs  Lab 11/05/21 0500 11/06/21 1251 11/07/21 0526 11/07/21 1537 12-06-2021 0351  NA 140  --   --   --  143  K 4.4  --   --   --  3.9  CL 114*  --   --   --  122*  CO2 19*  --   --   --  16*  GLUCOSE 135*  --   --   --  109*  BUN 40*  --   --   --  59*  CREATININE 1.94*  --   --   --  2.48*  CALCIUM 7.5*  --   --   --  7.7*  MG  --    < > 1.8 1.9  --   PHOS  --    < > 3.7 3.5  --    < > = values in this interval not displayed.   Lipid Panel:  Recent Labs  Lab 12-06-21 0351  CHOL 109  TRIG 121  HDL 24*  CHOLHDL 4.5  VLDL 24  LDLCALC 61   HgbA1c:  Recent Labs  Lab 11/03/21 1518  HGBA1C 5.2   Urine Drug Screen: No results for input(s): "LABOPIA", "COCAINSCRNUR", "LABBENZ", "AMPHETMU", "THCU", "LABBARB" in the last 168 hours.  Alcohol Level  Recent Labs  Lab 11/05/2021 1519  ETH <10    IMAGING past 24 hours Overnight EEG with video  Result Date: 06-Dec-2021 Charlsie Quest, MD     12-06-21 11:32 AM Patient Name: Jacqueline Richmond MRN: [id number] Epilepsy Attending: Charlsie Quest Referring Physician/Provider: Charlsie Quest, MD Duration: 11/07/2021 1201 to December 06, 2021 4010  Patient history: 76 year old female with altered mental status. EEG to evaluate for seizure.   Level of alertness: comatose  AEDs during EEG study: LEV  Technical aspects: This EEG study was done with scalp electrodes positioned according to the 10-20 International system of electrode placement. Electrical activity was reviewed with band pass filter of 1-70Hz , sensitivity of 7 uV/mm, display speed of 33mm/sec with a 60Hz  notched filter applied as appropriate. EEG data were recorded continuously and digitally stored.  Video monitoring was available and reviewed as appropriate.  Description:  EEG showed sharply contoured 12 to 13 Hz beta activity admixed with 2 to 3 Hz delta slowing and polyspikes with evolution in morphology in left hemisphere, maximal left posterior quadrant. No clinical signs were seen during this EEG pattern.  This is consistent with electrographic status epilepticus.  EEG also showed continuous generalized and maximal left posterior quadrant polymorphic 2  to 3 Hz delta slowing admixed with 12 to 14 Hz generalized beta activity.  Hyperventilation and photic stimulation were not performed.    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  Additionally there is severe diffuse encephalopathy likely due to seizures.  Jacqueline Richmond    PHYSICAL EXAM GENERAL: critically ill intubated female  HEENT: - Normocephalic and atraumatic, dry mm, E t tube  LUNGS - Clear to auscultation bilaterally with no wheezes CV - S1S2 RRR, no m/r/g, equal pulses bilaterally. ABDOMEN - Soft, nontender, nondistended with normoactive BS Ext: warm, well perfused, intact peripheral pulses, no edema   NEURO:  Mental Status:  Minimal response to noxious stimuli.  Cranial Nerves: Left pupil is 43mm and right is 31mm, both very sluggish.No blink to threat bilaterally,  unable to assess facial symmetry due to ET tube Motor: No spontaneous movements. Trace withdrawal in the left leg only. Tone: is normal and bulk is normal Sensation- Intact to noxious stimuli in LLE only Coordination: unable to assess   ASSESSMENT/PLAN Jacqueline Richmond is a 76 y.o. female  PAD with LE stents on plavix, CKD, HTN, DM,h/o deconditioning, functional quad in 05/2021 who was brought into [facility] via EMS from SNF as a level 1 trauma after suffering a fall. Per EMS patient had an unwitnessed but audible fall.  She was unresponsive with significant bleeding from the scalp. Hypertensive en route and vomited x3. She received TXA in the ED. NSGY is following, s/p L craniotomy 8/14, JP SS. Follow up [facility] reveals a new large hypoattenuation L frontal lobe. cEEG showed status epilepticus, which is likely the cause of her ongoing coma.   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 New large area of hypoattenuation in the left frontal lobe, likely indicating acute ischemia of the anterior left MCA territory. 2. Worsening rightward midline shift now measures 11 mm, previously 3 mm. 3. Unchanged size of right hemispheric subdural hematoma. Carotid Doppler   2D Echo 55% EF, no thrombus LDL 61 HgbA1c 5.2 VTE prophylaxis - SCDs only d/t SDH    Diet   Diet NPO time specified   aspirin 81 mg daily and clopidogrel 75 mg daily prior to admission, now on No antithrombotic. D/t SDH Therapy recommendations:  none at this time given change in GOC to terminally extubate for CMO Disposition:  Hospice if she survives extubation  Hypertension Home meds:  none  Hyperlipidemia Home meds:  none LDL 61, goal is not applicable for GOC  Diabetes type II  Home meds:  januvia HgbA1c 5.2, goal < 7.0 CBGs w/SSI till transition to CMO Recent Labs    11/07/21 2317 2021-11-13 0321 Nov 13, 2021 0720  GLUCAP 117* 87 107*    Other Stroke Risk  Factors Advanced Age >/= 65  Obesity, Body mass index is 33.17 kg/m., BMI >/= 30 associated with increased stroke risk, recommend weight loss, diet and exercise as appropriate  Coronary artery disease Obstructive sleep apnea  Other Active Problems Coma Status Epilpticus- Electrographic status epilepticus, left hemisphere, maximal left posterior quadrant seen on EEG prior to removal Agree with plan to transition to CMO at this time with terminal extubation; DNR  Hospital day # 4  Desiree Metzger-Cihelka, ARNP-C, ANVP-BC Pager: [phone]    I  have personally obtained history,examined this patient, reviewed notes, independently viewed imaging studies, participated in medical decision making and plan of care.ROS completed by me personally and pertinent positives fully documented  I have made any additions or clarifications directly to the above note. Agree with note above.  Patient with subdural hematoma status postcraniotomy with poor neurological exam and subsequent brain imaging showing left MCA branch infarct as well as EEG showing nonconvulsive status.  Patient has extremely poor prognosis of May meaningful improvement and living independently family.  She would not want her life of disability and prolonged nursing home stay which would have been unavoidable.  Family has made her DNR and comfort care and plan to withdraw support today.  Discussed with trauma team.This patient is critically ill and at significant risk of neurological worsening, death and care requires constant monitoring of vital signs, hemodynamics,respiratory and cardiac monitoring, extensive review of multiple databases, frequent neurological assessment, discussion with family, other specialists and medical decision making of high complexity.I have made any additions or clarifications directly to the above note.This critical care time does not reflect procedure time, or teaching time or supervisory time of PA/NP/Med Resident  etc but could involve care discussion time.  I spent 30 minutes of neurocritical care time  in the care of  this patient.      Delia Heady, MD Medical Director [facility] Stroke [facility] Pager: [phone] 10/31/2021 4:49 PM  To contact Stroke Continuity provider, please refer to [email or web address]. After hours, contact General Neurology

## 2021-11-22 NOTE — Progress Notes (Signed)
Trauma/Critical Care Follow Up Note  Subjective:    Overnight Issues:   Objective:  Vital signs for last 24 hours: Temp:  [98.2 F (36.8 C)-98.9 F (37.2 C)] 98.2 F (36.8 C) (08/18 0800) Pulse Rate:  [79-95] 79 (08/18 1200) Resp:  [22-37] 28 (08/18 1200) BP: (101-138)/(49-65) 105/49 (08/18 1200) SpO2:  [98 %-100 %] 100 % (08/18 1200) Arterial Line BP: (101-152)/(42-55) 127/51 (08/18 0300) FiO2 (%):  [40 %] 40 % (08/18 1050) Weight:  [74.5 kg] 74.5 kg (08/18 0412)  Hemodynamic parameters for last 24 hours:    Intake/Output from previous day: 08/17 0701 - 08/18 0700 In: 2532.7 [I.V.:1724.2; NG/GT:480; IV Piggyback:328.5] Out: 790 [Urine:790]  Intake/Output this shift: Total I/O In: 95.1 [I.V.:75.1; NG/GT:20] Out: 50 [Urine:50]  Vent settings for last 24 hours: Vent Mode: PRVC FiO2 (%):  [40 %] 40 % Set Rate:  [22 bmp] 22 bmp Vt Set:  [340 mL] 340 mL PEEP:  [5 cmH20] 5 cmH20 Plateau Pressure:  [14 cmH20-19 cmH20] 19 cmH20  Physical Exam:  Gen: comfortable, no distress Neuro: GCS3 HEENT: PERRL Neck: supple CV: RRR Pulm: unlabored breathing Abd: soft, NT GU: clear yellow urine Extr: wwp, no edema   Results for orders placed or performed during the hospital encounter of 2021-11-29 (from the past 24 hour(s))  Glucose, capillary     Status: None   Collection Time: 11/07/21  3:31 PM  Result Value Ref Range   Glucose-Capillary 83 70 - 99 mg/dL  Magnesium     Status: None   Collection Time: 11/07/21  3:37 PM  Result Value Ref Range   Magnesium 1.9 1.7 - 2.4 mg/dL  Phosphorus     Status: None   Collection Time: 11/07/21  3:37 PM  Result Value Ref Range   Phosphorus 3.5 2.5 - 4.6 mg/dL  Glucose, capillary     Status: None   Collection Time: 11/07/21  7:16 PM  Result Value Ref Range   Glucose-Capillary 92 70 - 99 mg/dL  Glucose, capillary     Status: Abnormal   Collection Time: 11/07/21 11:17 PM  Result Value Ref Range   Glucose-Capillary 117 (H) 70 - 99  mg/dL  Glucose, capillary     Status: None   Collection Time: 10/31/2021  3:21 AM  Result Value Ref Range   Glucose-Capillary 87 70 - 99 mg/dL  CBC     Status: Abnormal   Collection Time: 10/31/2021  3:51 AM  Result Value Ref Range   WBC 12.9 (H) 4.0 - 10.5 K/uL   RBC 3.04 (L) 3.87 - 5.11 MIL/uL   Hemoglobin 7.9 (L) 12.0 - 15.0 g/dL   HCT 62.0 (L) 35.5 - 97.4 %   MCV 85.2 80.0 - 100.0 fL   MCH 26.0 26.0 - 34.0 pg   MCHC 30.5 30.0 - 36.0 g/dL   RDW 16.3 (H) 84.5 - 36.4 %   Platelets 192 150 - 400 K/uL   nRBC 0.0 0.0 - 0.2 %  Basic metabolic panel     Status: Abnormal   Collection Time: 10/29/2021  3:51 AM  Result Value Ref Range   Sodium 143 135 - 145 mmol/L   Potassium 3.9 3.5 - 5.1 mmol/L   Chloride 122 (H) 98 - 111 mmol/L   CO2 16 (L) 22 - 32 mmol/L   Glucose, Bld 109 (H) 70 - 99 mg/dL   BUN 59 (H) 8 - 23 mg/dL   Creatinine, Ser 6.80 (H) 0.44 - 1.00 mg/dL   Calcium 7.7 (L)  8.9 - 10.3 mg/dL   GFR, Estimated 20 (L) >60 mL/min   Anion gap 5 5 - 15  Lipid panel     Status: Abnormal   Collection Time: 11/13/2021  3:51 AM  Result Value Ref Range   Cholesterol 109 0 - 200 mg/dL   Triglycerides 106 <269 mg/dL   HDL 24 (L) >48 mg/dL   Total CHOL/HDL Ratio 4.5 RATIO   VLDL 24 0 - 40 mg/dL   LDL Cholesterol 61 0 - 99 mg/dL  Glucose, capillary     Status: Abnormal   Collection Time: 11/15/2021  7:20 AM  Result Value Ref Range   Glucose-Capillary 107 (H) 70 - 99 mg/dL    Assessment & Plan: The plan of care was discussed with the bedside nurse for the day, who is in agreement with this plan and no additional concerns were raised.   Present on Admission:  SDH (subdural hematoma) (HCC)    LOS: 4 days   Additional comments:I reviewed the patient's new clinical lab test results.   and I reviewed the patients new imaging test results.    Fall   VDRF - full support Bilateral SDH R>L with uncal herniation - NSGY c/s, Dr. Jordan Likes, s/p L craniotomy 8/14, JP SS, F/U CT H this AM shows new  large hypoattenuation L frontal lobe (antrerior L MCA territory) D/W Dr. Pearlean Brownie from the Stroke Service 8/17. R posterior 9th rib fx with small HTX - multimodal pain control and pulm toilet Scalp laceration - repaired with staples CKD stage IV HTN DM-2 PAD s/p stents on plavix - hold plavix H/o Deconditioning/ Functional quadriplegia March 2023 that is improved - anticipate high therapy needs ID - none VTE - SCDs only FEN - IVF, cortrak, TF at 20 for today as some distention, no vomiting 24h Foley - to remain Dispo - ICU, family elected for DNR status 8/16, desiring transition to comfort care today  Critical Care Total Time: 35 minutes  Diamantina Monks, MD Trauma & General Surgery Please use [email or web address] to contact on call provider  11/18/2021  *Care during the described time interval was provided by me. I have reviewed this patient's available data, including medical history, events of note, physical examination and test results as part of my evaluation.

## 2021-11-22 NOTE — Progress Notes (Signed)
Patient status has not changed.  Family has decided to pursue comfort care which I think is wise.  No new recommendations.

## 2021-11-22 NOTE — Progress Notes (Signed)
Cardiac time of death 1451. Dr. Bedelia Person notified.

## 2021-11-22 NOTE — Procedures (Signed)
Extubation Procedure Note  Patient Details:   Name: Jacqueline Richmond DOB: February 08, 1946 MRN: [id number]   Airway Documentation:    Vent end date: 11-10-2021 Vent end time: 1422  +cuff leak test prior to extubation.    Evaluation  O2 sats: stable throughout Complications: No apparent complications Patient did tolerate procedure well. Bilateral Breath Sounds: Rhonchi   No pt not able to speak, pt was extubated for withdrawal of life support.  Family at bedside and all questions answered.  Pt appears comfortable, no distress  noted.    Jennette Kettle 2021/11/10, 2:30 PM

## 2021-11-22 NOTE — Progress Notes (Signed)
SLP Cancellation Note  Patient Details Name: Jacqueline Richmond MRN: [id number] DOB: September 24, 1945   Cancelled treatment:       Reason Eval/Treat Not Completed: Patient not medically ready (Pt currently on the vent. SLP will follow up on subsequent date unless contacted sooner.)  Kyleeann Cremeans I. Vear Clock, MS, CCC-SLP Acute Rehabilitation Services Office number [phone]  Scheryl Marten 2021/12/07, 8:43 AM

## 2021-11-22 NOTE — Procedures (Signed)
Patient Name: Jacqueline Richmond  MRN: [id number]  Epilepsy Attending: Charlsie Quest  Referring Physician/Provider: Charlsie Quest, MD Duration: 11/07/2021 1201 to 11/15/2021 4210   Patient history: 76 year old female with altered mental status. EEG to evaluate for seizure.   Level of alertness: comatose   AEDs during EEG study: LEV   Technical aspects: This EEG study was done with scalp electrodes positioned according to the 10-20 International system of electrode placement. Electrical activity was reviewed with band pass filter of 1-70Hz , sensitivity of 7 uV/mm, display speed of 48mm/sec with a 60Hz  notched filter applied as appropriate. EEG data were recorded continuously and digitally stored.  Video monitoring was available and reviewed as appropriate.   Description:  EEG showed sharply contoured 12 to 13 Hz beta activity admixed with 2 to 3 Hz delta slowing and polyspikes with evolution in morphology in left hemisphere, maximal left posterior quadrant. No clinical signs were seen during this EEG pattern.  This is consistent with electrographic status epilepticus.  EEG also showed continuous generalized and maximal left posterior quadrant polymorphic 2 to 3 Hz delta slowing admixed with 12 to 14 Hz generalized beta activity.  Hyperventilation and photic stimulation were not performed.      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  Additionally there is severe diffuse encephalopathy likely due to seizures.   Bryanah Sidell 

## 2021-11-22 DEATH — deceased
